# Patient Record
Sex: Male | Born: 1945
Health system: Southern US, Community
[De-identification: ages and names within clinical notes are randomized; demographics above are authoritative.]

## PROBLEM LIST (undated history)

## (undated) ENCOUNTER — Ambulatory Visit (HOSPITAL_COMMUNITY): Admission: EM | Payer: Medicare Other

## (undated) DIAGNOSIS — E7800 Pure hypercholesterolemia, unspecified: Secondary | ICD-10-CM

## (undated) DIAGNOSIS — F419 Anxiety disorder, unspecified: Secondary | ICD-10-CM

## (undated) DIAGNOSIS — I10 Essential (primary) hypertension: Secondary | ICD-10-CM

## (undated) DIAGNOSIS — Z8709 Personal history of other diseases of the respiratory system: Secondary | ICD-10-CM

## (undated) DIAGNOSIS — M545 Low back pain, unspecified: Secondary | ICD-10-CM

## (undated) DIAGNOSIS — K759 Inflammatory liver disease, unspecified: Secondary | ICD-10-CM

## (undated) DIAGNOSIS — I209 Angina pectoris, unspecified: Secondary | ICD-10-CM

## (undated) DIAGNOSIS — F4310 Post-traumatic stress disorder, unspecified: Secondary | ICD-10-CM

## (undated) DIAGNOSIS — G8929 Other chronic pain: Secondary | ICD-10-CM

## (undated) DIAGNOSIS — F410 Panic disorder [episodic paroxysmal anxiety] without agoraphobia: Secondary | ICD-10-CM

## (undated) DIAGNOSIS — M1990 Unspecified osteoarthritis, unspecified site: Secondary | ICD-10-CM

## (undated) DIAGNOSIS — K635 Polyp of colon: Secondary | ICD-10-CM

## (undated) DIAGNOSIS — F32A Depression, unspecified: Secondary | ICD-10-CM

## (undated) DIAGNOSIS — I2510 Atherosclerotic heart disease of native coronary artery without angina pectoris: Secondary | ICD-10-CM

## (undated) DIAGNOSIS — F329 Major depressive disorder, single episode, unspecified: Secondary | ICD-10-CM

## (undated) HISTORY — DX: Depression, unspecified: F32.A

## (undated) HISTORY — DX: Major depressive disorder, single episode, unspecified: F32.9

## (undated) HISTORY — DX: Polyp of colon: K63.5

## (undated) HISTORY — PX: VARICOSE VEIN SURGERY: SHX832

---

## 1967-11-30 HISTORY — PX: OTHER SURGICAL HISTORY: SHX169

## 2002-12-11 ENCOUNTER — Emergency Department (HOSPITAL_COMMUNITY): Admission: EM | Admit: 2002-12-11 | Discharge: 2002-12-11 | Payer: Self-pay | Admitting: Emergency Medicine

## 2002-12-12 ENCOUNTER — Emergency Department (HOSPITAL_COMMUNITY): Admission: EM | Admit: 2002-12-12 | Discharge: 2002-12-12 | Payer: Self-pay

## 2004-02-01 ENCOUNTER — Emergency Department (HOSPITAL_COMMUNITY): Admission: EM | Admit: 2004-02-01 | Discharge: 2004-02-01 | Payer: Self-pay | Admitting: Emergency Medicine

## 2004-08-23 ENCOUNTER — Emergency Department (HOSPITAL_COMMUNITY): Admission: EM | Admit: 2004-08-23 | Discharge: 2004-08-23 | Payer: Self-pay | Admitting: Emergency Medicine

## 2004-08-30 ENCOUNTER — Emergency Department (HOSPITAL_COMMUNITY): Admission: EM | Admit: 2004-08-30 | Discharge: 2004-08-30 | Payer: Self-pay | Admitting: Family Medicine

## 2004-10-31 ENCOUNTER — Emergency Department (HOSPITAL_COMMUNITY): Admission: EM | Admit: 2004-10-31 | Discharge: 2004-10-31 | Payer: Self-pay | Admitting: Emergency Medicine

## 2004-11-16 ENCOUNTER — Emergency Department (HOSPITAL_COMMUNITY): Admission: EM | Admit: 2004-11-16 | Discharge: 2004-11-16 | Payer: Self-pay | Admitting: Anesthesiology

## 2004-11-28 ENCOUNTER — Emergency Department (HOSPITAL_COMMUNITY): Admission: EM | Admit: 2004-11-28 | Discharge: 2004-11-28 | Payer: Self-pay | Admitting: *Deleted

## 2004-11-28 ENCOUNTER — Emergency Department (HOSPITAL_COMMUNITY): Admission: AD | Admit: 2004-11-28 | Discharge: 2004-11-28 | Payer: Self-pay | Admitting: Family Medicine

## 2005-01-17 ENCOUNTER — Emergency Department (HOSPITAL_COMMUNITY): Admission: EM | Admit: 2005-01-17 | Discharge: 2005-01-17 | Payer: Self-pay | Admitting: Family Medicine

## 2005-01-27 DIAGNOSIS — I209 Angina pectoris, unspecified: Secondary | ICD-10-CM

## 2005-01-27 HISTORY — PX: CARDIAC CATHETERIZATION: SHX172

## 2005-01-27 HISTORY — DX: Angina pectoris, unspecified: I20.9

## 2005-01-27 HISTORY — PX: CORONARY ARTERY BYPASS GRAFT: SHX141

## 2005-02-11 ENCOUNTER — Inpatient Hospital Stay (HOSPITAL_COMMUNITY): Admission: EM | Admit: 2005-02-11 | Discharge: 2005-02-21 | Payer: Self-pay | Admitting: Emergency Medicine

## 2005-02-22 ENCOUNTER — Emergency Department (HOSPITAL_COMMUNITY): Admission: EM | Admit: 2005-02-22 | Discharge: 2005-02-22 | Payer: Self-pay | Admitting: Emergency Medicine

## 2005-02-27 ENCOUNTER — Inpatient Hospital Stay (HOSPITAL_COMMUNITY): Admission: EM | Admit: 2005-02-27 | Discharge: 2005-03-03 | Payer: Self-pay | Admitting: Family Medicine

## 2005-03-17 ENCOUNTER — Emergency Department (HOSPITAL_COMMUNITY): Admission: EM | Admit: 2005-03-17 | Discharge: 2005-03-17 | Payer: Self-pay | Admitting: Family Medicine

## 2005-03-22 ENCOUNTER — Encounter (HOSPITAL_COMMUNITY): Admission: RE | Admit: 2005-03-22 | Discharge: 2005-06-20 | Payer: Self-pay | Admitting: Interventional Cardiology

## 2005-03-30 ENCOUNTER — Emergency Department (HOSPITAL_COMMUNITY): Admission: EM | Admit: 2005-03-30 | Discharge: 2005-03-30 | Payer: Self-pay | Admitting: Emergency Medicine

## 2005-04-01 ENCOUNTER — Observation Stay (HOSPITAL_COMMUNITY): Admission: AD | Admit: 2005-04-01 | Discharge: 2005-04-02 | Payer: Self-pay | Admitting: Interventional Cardiology

## 2005-05-30 ENCOUNTER — Emergency Department (HOSPITAL_COMMUNITY): Admission: EM | Admit: 2005-05-30 | Discharge: 2005-05-30 | Payer: Self-pay | Admitting: Emergency Medicine

## 2005-06-10 ENCOUNTER — Emergency Department (HOSPITAL_COMMUNITY): Admission: EM | Admit: 2005-06-10 | Discharge: 2005-06-10 | Payer: Self-pay | Admitting: Emergency Medicine

## 2005-07-09 ENCOUNTER — Emergency Department (HOSPITAL_COMMUNITY): Admission: EM | Admit: 2005-07-09 | Discharge: 2005-07-09 | Payer: Self-pay | Admitting: Family Medicine

## 2005-12-08 ENCOUNTER — Emergency Department (HOSPITAL_COMMUNITY): Admission: EM | Admit: 2005-12-08 | Discharge: 2005-12-08 | Payer: Self-pay | Admitting: Emergency Medicine

## 2005-12-12 ENCOUNTER — Emergency Department (HOSPITAL_COMMUNITY): Admission: EM | Admit: 2005-12-12 | Discharge: 2005-12-12 | Payer: Self-pay | Admitting: Emergency Medicine

## 2006-01-30 ENCOUNTER — Observation Stay (HOSPITAL_COMMUNITY): Admission: EM | Admit: 2006-01-30 | Discharge: 2006-01-31 | Payer: Self-pay | Admitting: Emergency Medicine

## 2006-03-11 ENCOUNTER — Emergency Department (HOSPITAL_COMMUNITY): Admission: EM | Admit: 2006-03-11 | Discharge: 2006-03-11 | Payer: Self-pay | Admitting: Family Medicine

## 2006-03-20 ENCOUNTER — Emergency Department (HOSPITAL_COMMUNITY): Admission: EM | Admit: 2006-03-20 | Discharge: 2006-03-20 | Payer: Self-pay | Admitting: Emergency Medicine

## 2006-04-30 ENCOUNTER — Emergency Department (HOSPITAL_COMMUNITY): Admission: EM | Admit: 2006-04-30 | Discharge: 2006-04-30 | Payer: Self-pay | Admitting: Family Medicine

## 2006-05-22 ENCOUNTER — Emergency Department (HOSPITAL_COMMUNITY): Admission: EM | Admit: 2006-05-22 | Discharge: 2006-05-22 | Payer: Self-pay | Admitting: Emergency Medicine

## 2006-09-15 ENCOUNTER — Emergency Department (HOSPITAL_COMMUNITY): Admission: EM | Admit: 2006-09-15 | Discharge: 2006-09-15 | Payer: Self-pay | Admitting: Family Medicine

## 2006-10-23 ENCOUNTER — Emergency Department (HOSPITAL_COMMUNITY): Admission: EM | Admit: 2006-10-23 | Discharge: 2006-10-23 | Payer: Self-pay | Admitting: Emergency Medicine

## 2006-12-29 ENCOUNTER — Emergency Department (HOSPITAL_COMMUNITY): Admission: EM | Admit: 2006-12-29 | Discharge: 2006-12-29 | Payer: Self-pay | Admitting: Family Medicine

## 2007-01-01 ENCOUNTER — Emergency Department (HOSPITAL_COMMUNITY): Admission: EM | Admit: 2007-01-01 | Discharge: 2007-01-01 | Payer: Self-pay | Admitting: Emergency Medicine

## 2007-04-05 ENCOUNTER — Emergency Department (HOSPITAL_COMMUNITY): Admission: EM | Admit: 2007-04-05 | Discharge: 2007-04-05 | Payer: Self-pay | Admitting: Family Medicine

## 2007-09-08 ENCOUNTER — Emergency Department (HOSPITAL_COMMUNITY): Admission: EM | Admit: 2007-09-08 | Discharge: 2007-09-08 | Payer: Self-pay | Admitting: Family Medicine

## 2007-09-21 ENCOUNTER — Emergency Department (HOSPITAL_COMMUNITY): Admission: EM | Admit: 2007-09-21 | Discharge: 2007-09-21 | Payer: Self-pay | Admitting: Emergency Medicine

## 2007-12-28 ENCOUNTER — Emergency Department (HOSPITAL_COMMUNITY): Admission: EM | Admit: 2007-12-28 | Discharge: 2007-12-28 | Payer: Self-pay | Admitting: Emergency Medicine

## 2008-05-10 ENCOUNTER — Emergency Department (HOSPITAL_COMMUNITY): Admission: EM | Admit: 2008-05-10 | Discharge: 2008-05-10 | Payer: Self-pay | Admitting: Emergency Medicine

## 2008-05-30 ENCOUNTER — Emergency Department (HOSPITAL_COMMUNITY): Admission: EM | Admit: 2008-05-30 | Discharge: 2008-05-30 | Payer: Self-pay | Admitting: Family Medicine

## 2008-06-12 ENCOUNTER — Emergency Department (HOSPITAL_COMMUNITY): Admission: EM | Admit: 2008-06-12 | Discharge: 2008-06-12 | Payer: Self-pay | Admitting: Emergency Medicine

## 2008-07-07 ENCOUNTER — Emergency Department (HOSPITAL_COMMUNITY): Admission: EM | Admit: 2008-07-07 | Discharge: 2008-07-07 | Payer: Self-pay | Admitting: Emergency Medicine

## 2008-08-01 ENCOUNTER — Emergency Department (HOSPITAL_COMMUNITY): Admission: EM | Admit: 2008-08-01 | Discharge: 2008-08-01 | Payer: Self-pay | Admitting: Emergency Medicine

## 2008-09-09 ENCOUNTER — Emergency Department (HOSPITAL_COMMUNITY): Admission: EM | Admit: 2008-09-09 | Discharge: 2008-09-09 | Payer: Self-pay | Admitting: Emergency Medicine

## 2008-09-10 ENCOUNTER — Ambulatory Visit: Payer: Self-pay | Admitting: Surgery

## 2008-09-10 ENCOUNTER — Emergency Department (HOSPITAL_COMMUNITY): Admission: EM | Admit: 2008-09-10 | Discharge: 2008-09-10 | Payer: Self-pay | Admitting: Emergency Medicine

## 2008-09-10 ENCOUNTER — Encounter (INDEPENDENT_AMBULATORY_CARE_PROVIDER_SITE_OTHER): Payer: Self-pay | Admitting: Emergency Medicine

## 2008-11-15 ENCOUNTER — Emergency Department (HOSPITAL_COMMUNITY): Admission: EM | Admit: 2008-11-15 | Discharge: 2008-11-15 | Payer: Self-pay | Admitting: Emergency Medicine

## 2008-12-29 ENCOUNTER — Emergency Department (HOSPITAL_COMMUNITY): Admission: EM | Admit: 2008-12-29 | Discharge: 2008-12-29 | Payer: Self-pay | Admitting: Emergency Medicine

## 2009-01-02 ENCOUNTER — Emergency Department (HOSPITAL_COMMUNITY): Admission: EM | Admit: 2009-01-02 | Discharge: 2009-01-02 | Payer: Self-pay | Admitting: Emergency Medicine

## 2009-01-06 ENCOUNTER — Emergency Department (HOSPITAL_COMMUNITY): Admission: EM | Admit: 2009-01-06 | Discharge: 2009-01-06 | Payer: Self-pay | Admitting: Emergency Medicine

## 2009-05-15 ENCOUNTER — Emergency Department (HOSPITAL_COMMUNITY): Admission: EM | Admit: 2009-05-15 | Discharge: 2009-05-15 | Payer: Self-pay | Admitting: Emergency Medicine

## 2009-12-26 ENCOUNTER — Emergency Department (HOSPITAL_COMMUNITY): Admission: EM | Admit: 2009-12-26 | Discharge: 2009-12-26 | Payer: Self-pay | Admitting: Emergency Medicine

## 2010-04-14 ENCOUNTER — Emergency Department (HOSPITAL_COMMUNITY): Admission: EM | Admit: 2010-04-14 | Discharge: 2010-04-14 | Payer: Self-pay | Admitting: Family Medicine

## 2010-10-24 ENCOUNTER — Observation Stay (HOSPITAL_COMMUNITY)
Admission: EM | Admit: 2010-10-24 | Discharge: 2010-10-25 | Payer: Self-pay | Source: Home / Self Care | Admitting: Emergency Medicine

## 2011-02-09 LAB — BASIC METABOLIC PANEL
CO2: 25 mEq/L (ref 19–32)
Calcium: 9.3 mg/dL (ref 8.4–10.5)
GFR calc Af Amer: >60 mL/min (ref >60)
GFR calc non Af Amer: >60 mL/min (ref >60)
Sodium: 138 mEq/L (ref 135–145)

## 2011-02-09 LAB — LIPID PANEL
Cholesterol: 252 mg/dL — ABNORMAL HIGH (ref 0–200)
Total CHOL/HDL Ratio: 6.5 RATIO

## 2011-02-09 LAB — POCT CARDIAC MARKERS
CKMB, poc: <1 ng/mL — ABNORMAL LOW (ref 1.0–8.0)
Myoglobin, poc: 62.1 ng/mL (ref 12–200)
Myoglobin, poc: 62.8 ng/mL (ref 12–200)
Troponin i, poc: <0.05 ng/mL (ref 0.00–0.09)

## 2011-02-09 LAB — CBC
Hemoglobin: 16.7 g/dL (ref 13.0–17.0)
RBC: 5.27 MIL/uL (ref 4.22–5.81)
WBC: 5.7 10*3/uL (ref 4.0–10.5)

## 2011-02-09 LAB — DIFFERENTIAL
Lymphocytes Relative: 46 % (ref 12–46)
Lymphs Abs: 2.6 10*3/uL (ref 0.7–4.0)
Monocytes Relative: 8 % (ref 3–12)
Neutro Abs: 2.5 10*3/uL (ref 1.7–7.7)
Neutrophils Relative %: 45 % (ref 43–77)

## 2011-02-09 LAB — CARDIAC PANEL(CRET KIN+CKTOT+MB+TROPI)
CK, MB: 1.3 ng/mL (ref 0.3–4.0)
Relative Index: INVALID (ref 0.0–2.5)
Relative Index: INVALID (ref 0.0–2.5)
Total CK: 74 U/L (ref 7–232)
Troponin I: 0.01 ng/mL (ref 0.00–0.06)
Troponin I: <0.01 ng/mL (ref 0.00–0.06)

## 2011-02-15 LAB — CBC
MCHC: 35.1 g/dL (ref 30.0–36.0)
Platelets: 144 10*3/uL — ABNORMAL LOW (ref 150–400)
RBC: 5.37 MIL/uL (ref 4.22–5.81)
WBC: 5.7 10*3/uL (ref 4.0–10.5)

## 2011-02-15 LAB — DIFFERENTIAL
Basophils Relative: 1 % (ref 0–1)
Monocytes Relative: 11 % (ref 3–12)
Neutro Abs: 3 10*3/uL (ref 1.7–7.7)
Neutrophils Relative %: 53 % (ref 43–77)

## 2011-02-15 LAB — POCT I-STAT, CHEM 8
Chloride: 105 mEq/L (ref 96–112)
Glucose, Bld: 98 mg/dL (ref 70–99)
HCT: 53 % — ABNORMAL HIGH (ref 39.0–52.0)
Hemoglobin: 18 g/dL — ABNORMAL HIGH (ref 13.0–17.0)
Potassium: 4.2 mEq/L (ref 3.5–5.1)
Sodium: 139 mEq/L (ref 135–145)

## 2011-03-08 LAB — RPR: RPR Ser Ql: NONREACTIVE

## 2011-04-13 NOTE — Consult Note (Signed)
[name], [name] NO.:  [id number]   MEDICAL RECORD NO.:  [id number]          PATIENT TYPE:  EMS   LOCATION:  MAJO                         FACILITY:  [facility]   PHYSICIAN:  Lyn Records, M.D.   DATE OF BIRTH:  1946/01/15   DATE OF CONSULTATION:  08/01/2008  DATE OF DISCHARGE:  08/01/2008                                 CONSULTATION   CHIEF COMPLAINT:  Chest pain.   HISTORY OF PRESENT ILLNESS:  [name] is a 65 year old male patient  with a history of coronary artery disease.  He underwent bypass grafting  in March 2006 with a LIMA to the distal LAD and a left radial artery to  the first circumflex marginal branch as well as a saphenous vein graft  to the first diagonal branch.  The next day, the patient had unrelenting  chest pain and cardiac catheterization at that time was emergently  performed, and this showed graft failure with 100% occlusion of the  saphenous vein graft to the first diagonal and kinks/spasms in the  radial OM graft and in the LIMA to the LAD graft was seen, outlet  stenosis in the distal LIMA anastomosis site obstructing the LAD greater  than 95%.  The patient was taken back to the operating room for  emergency redo coronary artery bypass grafting x1 with a LIMA to the  distal LAD.   With that history, the patient started having left neck, left chest,  left arm muscle aching today lasting only for a few minutes.  He came to  the emergency room.  His EKG is within normal limits.  He has no  associated symptoms.  He states that he occasionally gets chest pain.  He takes an aspirin and is better.  These episodes only last for a few  minutes.  These occur over several months' time period.  There is no  pattern.   He came to the emergency room today and point-of-care enzymes were  negative x2 and his EKG is normal.  He is pain-free and he is begging to  go home.   PAST MEDICAL HISTORY:  1. Hypertension.  2. Anxiety since his brother's  death in 03/17/2008.  3. Coronary artery disease as described above.  4. Hyperlipidemia.  5. Long-term medication use.   SOCIAL HISTORY:  He lives in [place] with his wife and child.  He  does not smoke.  He drinks occasionally.  He denies any illicit drug  use.   FAMILY HISTORY:  Mom died at age 73 of diabetes.  Dad died at age 18 of  coronary artery disease.  His brother recently died of prostate cancer  as described above.  His review of systems as above, otherwise negative.   ALLERGIES:  CODEINE, PERCOCET, and VICODIN.   MEDICATIONS:  1. Enteric-coated aspirin 325 mg a day.  2. Lipitor 20 mg a day.  3. Toprol-XL 25 mg a day.  4. Sublingual nitroglycerin p.r.n. chest pain.   PHYSICAL EXAMINATION:  VITAL SIGNS:  Temperature 98, pulse 72,  respirations 16, blood pressure 120/61, O2 saturations 97% on room  air.  GENERAL:  He is in no acute distress.  HEENT:  Grossly normal.  Sclerae clear.  Conjunctivae normal.  Nares  without drainage.  NECK:  No carotid or subclavian bruits.  No  JVD or thyromegaly.  CHEST:  Clear to auscultation bilaterally.  No wheezing or rhonchi.  HEART:  Regular rate and rhythm.  No evidence of murmur.  ABDOMEN:  Good bowel sounds, nontender, nondistended.  No masses.  No  bruits.  EXTREMITIES:  No lower extremity edema.  SKIN:  Warm and dry.  NEUROLOGIC:  Cranial nerves II through XII grossly intact.  Normal mood  and affect.   LABORATORY DATA:  Chest x-ray:  Nonacute.  Labs show hemoglobin of 16,  hematocrit 47, sodium 138, potassium 4.6, BUN 18, creatinine 1.2.  Point-  of-care markers negative x2.  EKG shows normal sinus rhythm with  nonspecific ST-T wave changes in the anterior leads which is unchanged  from February 2008.   ASSESSMENT AND PLAN:  1. Atypical chest pain, resolved.  2. Known coronary artery disease with complicated coronary history as      described above.  3. Hypertension.  4. Anxiety.  5. Hyperlipidemia.   I have  discussed the patient in full with Dr. Verdis Prime.  At this  point, we feel that the patient could go home, but I have arranged for  him to have a stress Cardiolite tomorrow morning at 10:00 a.m.  He is to  return to the emergency room if he has any further chest pain.  He does  have sublingual nitroglycerin at home.  He also needs to find a family  physician.  I have referred him to [facility], Dr. Renford Dills.  [name] wants to establish a primary care physician and someone to  help him with his anxiety since his brother's death.  The patient is  being discharged to home in stable and improved condition.  He is pain-  free.      Guy Franco, P.A.      Lyn Records, M.D.  Electronically Signed    LB/MEDQ  D:  08/01/2008  T:  08/02/2008  Job:  [id number]   cc:   Lyn Records, M.D.

## 2011-04-16 NOTE — Discharge Summary (Signed)
NAMEJAEGER, TRUEHEART               ACCOUNT NO.:  [id number]   MEDICAL RECORD NO.:  [id number]          PATIENT TYPE:  INP   LOCATION:  2015                         FACILITY:  [facility]   PHYSICIAN:  Lyn Records, M.D.   DATE OF BIRTH:  09/19/1946   DATE OF ADMISSION:  01/30/2006  DATE OF DISCHARGE:  01/31/2006                                 DISCHARGE SUMMARY   DISCHARGE DIAGNOSES:  1.  Left arm pain similar to angina equivalent, resolved.  2.  Palpitations controlled.  3.  Dyslipidemia, treated.  4.  Anxiety.  5.  Hypertension treated.   HOSPITAL COURSE:  Mr. Hanton is a 65 year old male patient who presented to  [facility] emergency room on January 30, 2006 complaining of left arm pain that  was similar to his previous anginal pain. He has a history of coronary  artery disease and underwent CABG in March, 2006 with a repeat CABG the  following day for a tight LIMA stenosis.   The patient's 12 lead EKG showed normal sinus rhythm rate 60 with no acute  ST-T wave changes. His cardiac isoenzymes were negative. He underwent a  stress Cardiolite that was negative with the exception of an elevated blood  pressure response. The nuclear picture showed no evidence of ischemia with  an EF of 62%. After monitoring him 24 hours in the hospital, we felt that he  was ready for discharge to home. However, he did need treatment for his  blood pressure. Because his heart rate had remained in the 50's during his  stay, we felt that we could keep him on his Toprol, but not increase it and  in fact added hydrochlorothiazide to his medication regimen.   DISCHARGE MEDICATIONS:  1.  Enteric coated aspirin 325 mg a day.  2.  Lipitor 20 mg a day.  3.  Sublingual nitroglycerin p.r.n. chest pain.  4.  Toprol XL 25 mg a day.  5.  Hydrochlorothiazide 12.5 mg once a day.   He is to return to see Dr. Katrinka Blazing on February 16, 2006 at 10:30 a.m. He is to  call if he has any further chest pain or any  questions.      Guy Franco, P.A.      Lyn Records, M.D.  Electronically Signed    LB/MEDQ  D:  01/31/2006  T:  02/01/2006  Job:  [id number]   cc:   Courtney Paris, M.D.  Fax: [phone]

## 2011-04-16 NOTE — Op Note (Signed)
[name], [name]               ACCOUNT NO.:  [id number]   MEDICAL RECORD NO.:  [id number]          PATIENT TYPE:  INP   LOCATION:  2314                         FACILITY:  [facility]   PHYSICIAN:  [name], M.D. DATE OF BIRTH:  10/21/1946   DATE OF PROCEDURE:  02/14/2005  DATE OF DISCHARGE:                                 OPERATIVE REPORT   PREOPERATIVE DIAGNOSIS:  Severe two-vessel coronary artery disease with  class IV unstable angina.   POSTOPERATIVE DIAGNOSIS:  Severe two-vessel coronary artery disease with  class IV unstable angina.   PROCEDURE:  Urgent median sternotomy for coronary artery bypass grafting x3  (left internal mammary artery to distal left anterior descending coronary  artery, left radial artery to first circumflex marginal branch, saphenous  vein graft to first diagonal branch, endoscopic saphenous vein harvest from  right thigh).   SURGEON:  [name], M.D.   ASSISTANT:  Ms. Pecola Leisure, PA   ANESTHESIA:  General.   BRIEF CLINICAL NOTE:  The patient is a 65 year old African American male  with no previous history of coronary artery disease, but risk factors  notable for history of hyperlipidemia and a strong family history of  coronary artery disease.  The patient presents with symptoms of chest pain  compatible with unstable angina.  He was admitted to the hospital and ruled  out for acute myocardial infarction.  He subsequently underwent cardiac  catheterization by Dr. Verdis Prime and findings were notable for the  presence of critical two-vessel coronary artery disease with coronary  anatomy unfavorable for percutaneous coronary intervention.  There was  particular note of high-grade proximal left anterior descending coronary  artery stenosis.  Left ventricular function is normal.  A full consultation  note has been dictated previously.   Over the ensuing 12 hours prior to surgery, the patient developed prolonged  episode of  substernal chest pain radiating to his shoulder and back  consistent with unstable angina requiring escalating therapy with IV  nitroglycerin.  The patient was stabilized somewhat with IV nitroglycerin,  but due to the prolonged episode of severe chest pain and critical high-  grade proximal left anterior descending coronary stenosis, urgent  revascularization was felt indicated.   OPERATIVE CONSENT:  The patient has been counseled at length regarding the  indications and potential benefits of coronary artery bypass grafting.  He  understands and accepts all associated risks of surgery and desires to  proceed as described.   OPERATIVE FINDINGS:  1.  Normal left ventricular function with mild left ventricular hypertrophy.  2.  Very poor saphenous vein conduit with only one segment of saphenous vein      usable as conduit for bypass grafting.  3.  Intramyocardial left anterior descending coronary artery.  4.  Good-quality distal targets for grafting   OPERATIVE NOTE IN DETAIL:  The patient is brought to the operating room and  above-mentioned date and placed in the supine position on the operating  table.  Central monitoring is established by the anesthesia service under  the care and direction of Dr. Arta Bruce.  Specifically,  a Swan-Ganz  catheter is placed through the right internal jugular approach.  A radial  arterial line is placed.  Intravenous antibiotics are administered.  Following induction with general endotracheal anesthesia, a Foley catheter  is placed.  The patient's chest, left upper extremity, abdomen, both groins,  and both lower extremities were prepared and draped in a sterile manner.   A median sternotomy incision is performed and the left internal mammary  artery is dissected from the chest wall and prepared for bypass grafting.  The left internal mammary artery is a good-quality conduit.  Simultaneously,  saphenous vein is obtained from the patient's right thigh  using endoscopic  vein harvest technique.  Thi saphenous vein is somewhat small caliber and  the majority of this vein is not suitable quality to be utilized as conduit.  One short segment of the vein is felt to be acceptable and used as conduit,  although small caliber and relatively poor quality.  The entire saphenous  vein on the left side has been removed surgically in the past, and the  patient has considerable varicose veins involving the right lower leg.   Subsequently, the left radial artery is harvested from the left forearm  through a longitudinal incision along the volar aspect of the left forearm.  The artery is dissected from associated structures and all intervening side  branches of the artery and its associated veins are divided with the  harmonic scalpel beginning just after the antecubital fossa beyond the  takeoff of the first interosseous perforating branch and the distal end is  stopped just above the wrist.  The distal end of the artery is transected  and the distal stump oversewn with suture ligatures.  The proximal end of  the artery is also transected and proximal stump oversewn with suture  ligatures.  The arterial conduit is notably of good quality.  The forearm  incision is irrigated with saline solution, inspected for hemostasis, and  subsequently closed using a single layer of running absorbable suture to  close the subcutaneous tissues followed by a running subcuticular skin  closure.  A small surgical incision in the right thigh is also closed with  running absorbable suture.   The patient is heparinized systemically.  The pericardium is opened.  The  ascending aorta is normal in appearance.  The ascending aorta and the right  atrium are cannulated for cardiopulmonary bypass.  Adequate heparinization  is verified.  Cardiopulmonary bypass is begun and the surface of the heart is inspected.  There is mild left ventricular hypertrophy.  The heart is   otherwise normal.  The left anterior descending coronary artery is  intramyocardial during the majority of its course down the anterior wall.  A  temperature probe is placed in the left ventricular septum.  A cardioplegia  catheter is placed in the ascending aorta.   The patient is allowed to cool passively to 32 degrees systemic temperature.  The aortic crossclamp is applied and cardioplegia is delivered in the  antegrade fashion through the aortic root.  Iced saline slush is applied for  topical hypothermia.  The initial cardioplegic arrest and myocardial cooling  are felt to be excellent.  Repeat doses of cardioplegia are administered  intermittently throughout the crossclamp portion of the operation, both  through the aortic root and down the single subsequently placed vein graft  to maintain septal temperature below 15 degrees centigrade.   The following distal coronary anastomoses are performed:  #1 - The first  circumflex marginal branch is grafted with left radial artery in an end-to-  side fashion.  This coronary measured 2.0 mm in diameter and is of good  quality at the site of distal bypass.  #2 - The first diagonal branch off  the left anterior descending coronary artery is grafted with a saphenous  vein graft in an end-to-side fashion.  This coronary measured 1.5 mm in  diameter and is of good quality at the site of distal bypass.  #3 - The  distal left anterior descending coronary artery is grafted with the left  internal mammary artery in and end-to-side fashion.  This coronary artery is  intramyocardial.  It measures 2 mm at the site of distal bypass.   The single proximal saphenous vein anastomoses and the proximal anastomosis  of the left radial artery are both performed directly to the ascending aorta  prior to removal of the aortic crossclamp.  The left ventricular septal  temperature rises rapidly with reperfusion of the left internal mammary  artery.  The aortic  crossclamp is removed after a total crossclamp time of  61 minutes.   The heart begins to beat spontaneously without need for cardioversion.  All  proximal and distal coronary anastomoses are inspected for hemostasis and  appropriate graft orientation.  Epicardial pacing wires are affixed to the  right ventricular outflow tract and to the right atrial appendage.  The  patient is rewarmed to 37 degrees centigrade temperature.  The patient is  weaned from cardiopulmonary bypass without difficulty.  The patient's rhythm  at separation from bypass is normal sinus rhythm.  Atrial pacing is employed  to increase the heart rate.  Total cardiopulmonary bypass time the operation  is 75 minutes.  No inotropic support is required.   Follow-up transesophageal echocardiogram performed by Dr. Michelle Piper after  separation from bypass demonstrates normal left ventricular function.  There remains trivial aortic insufficiency and trivial mitral regurgitation, both  of which were unchanged from preoperatively.  No other abnormalities are  noted.   The venous and arterial cannulae are removed uneventfully.  Protamine is  administered to reverse the anticoagulation.  The mediastinum and left chest  are irrigated with saline solution containing vancomycin.  Meticulous  surgical hemostasis is ascertained.  There is some bleeding encountered from  the left internal mammary artery graft and this is corrected with a suture  ligature.  The mediastinum and left chest are drained with 3 chest tubes  exited through separate stab incisions inferiorly.   The On-Q pain management system is utilized for postoperative pain control.  The two 10-inch Silastic catheters supplied with the On-Q system are placed  through separate stab incisions along the costal margin with catheters  located in the deep subcutaneous tissues immediately anterior to the costal  cartilages, just lateral to the lateral border of the sternum, on  either  side of the sternum.  Each of these catheters are secured to the skin with  suture ligatures and subsequently bolused with 5 mL of 0.5% bupivacaine  solution.  The catheters are then connected to the On-Q continuous pain  management pump.   The median sternotomy is closed with double-strength sternal wires.  The  soft tissues anterior to the sternum are closed in multiple layers.  The  skin was closed with running subcuticular skin closure.   The patient tolerated the procedure well and is transported to the surgical  intensive care unit in stable condition.  There are no intraoperative  complications.  All sponge, instrument and needle counts are verified  correct at completion of the operation.  No blood products were  administered.      CHO/MEDQ  D:  02/14/2005  T:  02/15/2005  Job:  [id number]   cc:   Lesleigh Noe, M.D.  [address]  [address]  [place]  [place] [address]  Fax: [phone]

## 2011-04-16 NOTE — Op Note (Signed)
[name], [name]               ACCOUNT NO.:  [id number]   MEDICAL RECORD NO.:  [id number]          PATIENT TYPE:  INP   LOCATION:  2314                         FACILITY:  [facility]   PHYSICIAN:  [name], M.D. DATE OF BIRTH:  1946/01/04   DATE OF PROCEDURE:  02/15/2005  DATE OF DISCHARGE:                                 OPERATIVE REPORT   PREOPERATIVE DIAGNOSIS:  Unstable angina with high-grade stenosis of left  internal mammary artery to left anterior descending coronary artery graft,  status post coronary artery bypass grafting.   POSTOPERATIVE DIAGNOSIS:  Unstable angina with high-grade stenosis of left  internal mammary artery to left anterior descending coronary artery graft,  status post coronary artery bypass grafting.   PROCEDURE:  Emergency redo coronary artery bypass grafting x1 (left internal  mammary artery to distal left anterior descending coronary artery).   SURGEON:  [name], M.D.   ASSISTANT:  [name], [place]   ANESTHESIA:  General.   BRIEF CLINICAL NOTE:  The patient is a 65 year old male who underwent  coronary artery bypass grafting x3 on February 14, 2005 for severe 2-vessel  coronary artery disease with class I stable angina. The patient did very  well, initially postoperatively, with no hemodynamic stability or other  complications. He was extubated uneventfully on the early morning following  surgery. However, first thing in the morning the following day the patient  reported chest pain radiating to his arm that seemed perhaps a little  uncharacteristic for routine postoperative chest wall discomfort. In  addition, a 12-lead electrocardiogram revealed 1-2 mm ST elevation in the  anterior leads across the precordium. Cardiac enzymes were a very mildly  elevated. The patient was subsequently taken for cardiac catheterization by  Dr. Katrinka [name] and findings, at the time of catheterization, include high-grade  stenosis of the left anterior  descending coronary artery just after the  insertion of the distal anastomosis of the left internal mammary artery  graft. Emergent repeat revascularization is felt to be indicated to preserve  as much myocardium as possible, prevent recurrent symptoms of angina, or  future myocardial infarction.   Operative consent the patient and his family are counseled in the  catheterization lab holding area regarding possible options including  continued observation and medical therapy with possible percutaneous  coronary intervention at a later date. Percutaneous coronary intervention at  this juncture is felt not indicated due to the early postoperative time  course. However, it is felt that repeat surgical revascularization would  provide the best long-term solution under the circumstances. The patient  understands and accepts all associated risks of surgery and desires to  proceed as described.   OPERATIVE FINDINGS:  1.  Normal left ventricular function.  2.  High-grade stenosis of the left anterior descending coronary artery just      after insertion of the left internal mammary artery graft due to      angulation of the vessel and probable technical factors related to      surgery. There was no thrombosis or clot in the vessel.  3.  The left  radial artery graft appeared to be widely patent without      evidence of kinking or other problems.  4.  Thrombosis involving the small saphenous vein graft to the diagonal      branch.  5.  No more suitable saphenous vein to be utilized as conduit for repeat      grafting.   OPERATIVE NOTE IN DETAIL:  The patient is brought directly from the  catheterization lab to the operating room on the above-mentioned date and  placed in the supine position on the operating table. General endotracheal  anesthesia is induced uneventfully under the care and direction of Dr.  Judie [name]. Intravenous antibiotics are administered. The patient's  existing  chest tubes are removed. The patient's anterior chest, abdomen,  both lower extremities are prepared and draped in a sterile manner.   A small surgical incision is made in the patient's right lower leg, just  below the right knee, and the greater saphenous vein is localized just below  the level where was transected at the time of previous graft harvest the day  previously. At this level the saphenous vein is very small and not suitable  to be utilized as conduit. This saphenous vein is followed a short distance  further down the leg, but it never gets any larger. As such, an additional  segment of saphenous vein is not removed and the small surgical incision is  closed in multiple layers with running absorbable suture.   The median sternotomy incision is reopened sharply. The sternal wires are  removed. A retractor is placed and the chest explored. Overall the heart  appears normal. Intraoperative transesophageal echocardiogram is performed  by Dr. Randa [name] and confirms the presence of normal left ventricular function  with no significant wall motion abnormalities. The patient is heparinized  systemically.   The ascending aorta is cannulated uneventfully. A 2-stage venous cannula is  placed directly in the right atrium. Adequate heparinization is verified.  Cardiopulmonary bypass is begun and the surface of the heart is inspected.  The old vein graft to the diagonal branch appears to be occluded and filled  with thrombus. There is nothing obvious on external inspection of the left  internal mammary artery graft to suggest any sort of problem; and one can  easily feel a pulse of the graft itself. The left radial artery graft  appears to be normal with no signs of kinking or other obstruction of this  vessel. A cardioplegic catheter is placed in the ascending aorta. A  temperature probe is placed left ventricular septum.  The patient's temperature is allowed to cool passively to 34  degrees  systemic temperature. The aortic crossclamp is applied and cardioplegia is  delivered in antegrade fashion through the aortic root. A bulldog clamp is  placed on the left internal mammary artery pedicle. Iced saline slush is  applied for topical hypothermia. The initial cardioplegic arrest and  myocardial cooling are felt to be excellent. No additional doses of  cardioplegia are required.   The distal anastomoses of the left internal mammary artery graft is now  carefully reinspected. The left anterior descending coronary artery is  notably deeply intramyocardial. Further exposure of the vessel beyond the  level of the distal insertion is performed to expose the distal portion of  left anterior descending coronary artery. The previous sutures of the distal  anastomoses are excised. One can appreciate some kinking and stenosis of the  left anterior descending coronary artery at the distal  toe of the  anastomosis. All the sutures are removed and the graft taken down.   After all the sutures are removed and the graft taken down, a 1.5 mm probe  will easily pass in the distal left anterior descending coronary artery.  This vessel is now open approximately 3-or-4 mm further downstream to avoid  the bend in the vessel associated with the kink and stenosis in question.  The left internal mammary artery is now trimmed back to fresh tissue and  prepared for bypass grafting. The distal anastomosis is reconstructed in the  end-to-side fashion. The 1.5 probe will easily pass after completion of the  anastomosis. The sutures were tied and the left internal mammary artery  reperfused. The left ventricular septal temperature rises rapidly.  Meticulous hemostasis is ascertained. The aortic crossclamp is removed after  a total crossclamp time of 30 minutes.   The heart begins to beat spontaneously without need for cardioversion. The  patient is rewarmed to 37 degrees centigrade temperature. The  patient is  weaned from cardiopulmonary bypass without difficulty. The patient's rhythm  at separation from brought bypass is normal sinus rhythm. Atrial pacing is  employed to increase the heart rate. No inotropic support is required. Total  cardiopulmonary bypass time for the operation is 43 minutes.   The venous cannula is removed and some bleeding is encountered from the  right atrium. This is controlled with suture ligatures. The arterial cannula  is now removed uneventfully. Protamine is administered to reverse the  anticoagulation. Meticulous surgical hemostasis is ascertained. The  mediastinum and the left chest are irrigated with saline solution containing  vancomycin.   The mediastinum and left chest are drained using 3 chest tubes replaced  through the original stab incisions inferiorly. The On-Q continuous pain  management catheters are replaced for postoperative analgesia.  Each of these catheters are tunneled through the subcutaneous tissues and positioned  immediately anterior to the costal cartilages just lateral to the lateral  border of the sternum on either side. Each of these catheters are filled  with 5 mL of 0.5% bupivacaine solution and secured to the skin. Ultimately  these catheters are then connected to the On-Q continuous pain management  infusion pump.   The sternum is reclosed using double-strength sternal wires. The soft  tissues anterior to the sternum are closed in multiple layers; and the skin  is closed with running subcuticular skin closure.   The patient tolerated the procedure well. There are no intraoperative  complications. All sponge and instrument counts are notably correct at  completion the operation. Needle counts were incorrect and a chest x-ray was  performed in the room to verify no retained foreign bodies. The patient is  transferred to the surgical intensive care unit in stable condition. No  blood products were administered.       CHO/MEDQ  D:  02/15/2005  T:  02/15/2005  Job:  [id number]   cc:   Lyn Records III, M.D.  [address]  [address]  [place]  [place] [address]  Fax: [phone]

## 2011-04-16 NOTE — Consult Note (Signed)
[name], [name]               ACCOUNT NO.:  [id number]   MEDICAL RECORD NO.:  [id number]          PATIENT TYPE:  INP   LOCATION:  4703                         FACILITY:  [facility]   PHYSICIAN:  Salvatore Decent. Cornelius Moras, M.D. DATE OF BIRTH:  07-Oct-1946   DATE OF CONSULTATION:  02/12/2005  DATE OF DISCHARGE:                                   CONSULTATION   REASON FOR CONSULTATION:  Severe two-vessel coronary artery disease with  class IV unstable angina.   HISTORY OF PRESENT ILLNESS:  [name] is a 65 year old African-American  male with no previous history of coronary artery disease but risk factors  notable for history of hyperlipidemia and a strong family history of  coronary artery disease. The patient states that he has had intermittent  atypical symptoms over the last year which occasionally have included  exertional chest discomfort. He is also had occasional dizzy spells and  atypical chest pain. Over the last week, he had progressive symptoms of  chest pain and two days ago he was awoken from sleep at 2:00 a.m. with  severe substernal chest pain described as a bad toothache radiating across  the left chest. This occurred approximately five or six hours after he had  used Viagra. He was awoken from his sleep with severe pain that persisted,  prompting him to return to the emergency room. It was associated with  shortness of breath with activity and nausea and fatigue. The pain radiated  through to the left shoulder and arm as well as to his back. He was admitted  to the hospital and ruled out for acute myocardial infarction. Chest CT scan  was performed and ruled out pulmonary embolus. He subsequently underwent a  stress Cardiolite exam, and after review by Dr. Lyn Records the nuclear  images were felt to be suggestive of possible anterior wall ischemia. [name] was taken for elective cardiac catheterization today by Dr. Lyn Records. Findings at the time of  catheterization include critical three-vessel  coronary artery disease with coronary anatomy relatively unfavorable for  percutaneous coronary intervention. There is normal left ventricular  function. Cardiac surgical consultation was requested.   REVIEW OF SYSTEMS:  GENERAL:  The patient reports good appetite. He has  gained a little bit of weight over the last couple years with decreased  activity. CARDIOVASCULAR:  Notable for symptoms of unstable angina that  prompted hospital admission. LUNGS:  The patient has mild exertional  shortness of breath. He denies resting shortness of breath, PND, orthopnea,  or lower extremity edema. He has not had palpitations or syncope.  GASTROINTESTINAL: Notable only for occasional symptoms of heartburn and  reflux. The patient denies difficulty swallowing. He reports normal bowel  function. He denies hematochezia, hematemesis, melena. NEUROLOGICAL:  Negative. The patient denies symptom suggestive of previous TIA or stroke.  GENITOURINARY:  Notable for some erectile dysfunction as well as symptoms of  benign prostatic hypertrophy. This is stable. INFECTIOUS:  Negative.  HEMATOLOGIC:  Negative. ENDOCRINE:  Negative. PSYCHIATRIC:  Negative. HEENT:  Negative. The patient does wear glasses for vision. He has  a little bit of  hearing loss in his left ear.   PAST MEDICAL HISTORY:  1.  Hyperlipidemia.  2.  Benign prostatic hypertrophy.  3.  GE Reflux disease.  4.  Remote history of peptic ulcer disease.  5.  Varicose veins right lower extremity.   PAST SURGICAL HISTORY:  Varicose vein stripping in the right lower extremity  many years ago.   FAMILY HISTORY:  Is notable in that the patient's father died of myocardial  infarction in his 79s and one cousin died of myocardial infarction in his  48s and one brother has already had a heart attack.   SOCIAL HISTORY:  The patient is divorced and lives alone here in [place].  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 The patient reports a remote  history of tobacco use, although he quit smoking 30 years ago. He denies  history of excessive alcohol consumption.   MEDICATIONS PRIOR TO ADMISSION:  None other than the occasional Viagra.   ALLERGIES:  None known.   PHYSICAL EXAMINATION:  GENERAL:  The patient is well-appearing African-  American male who appears his stated age in no acute distress.  HEENT: Exam is grossly unrevealing.  NECK:  Neck is supple. There is no cervical or supraclavicular  lymphadenopathy. There is no jugular venous distension. No carotid bruits  noted.  CHEST:  Auscultation of the chest demonstrates clear breath sounds  bilaterally. No wheezes or rhonchi appreciated.  CARDIOVASCULAR: Notable for regular rate and rhythm. No murmurs, rubs or  gallops noted.  ABDOMEN:  The abdomen is soft, nontender. There are no palpable masses.  Bowel sounds are present. EXTREMITIES: Warm and well-perfused. There is no  lower extremity edema. There are some suggestion of venous insufficiency in  the right lower extremity. On the left side there does not appear to be any  significant signs of venous insufficiency. Distal pulses are palpable in  both lower legs at the ankle. Allen test is normal in the left hand.  RECTAL:  Deferred.  GENITOURINARY:  Deferred.  NEUROLOGIC: Examination is grossly nonfocal and symmetrical throughout.   LABORATORY DATA:  Cardiac catheterization performed by Dr. Lyn Records  today is reviewed. This demonstrates severe two-vessel coronary artery  disease with coronary anatomy unfavorable for percutaneous coronary  intervention. Specifically, there is 95% proximal stenosis of the left  anterior descending coronary artery with 80-90% stenosis of the mid left  anterior descending coronary artery arising after takeoff of the third  diagonal branch. There is 80% proximal stenosis of first diagonal branch. There is 90% proximal stenosis of a large first  circumflex marginal branch.  There are luminal irregularities and right coronary artery with low grade  stenosis all less than 20-30%. There is right dominant coronary circulation.  Left ventricular function appears normal.   IMPRESSION:  Severe two-vessel coronary artery disease with class I unstable  angina, normal left ventricular function and coronary anatomy unfavorable  for percutaneous coronary intervention. I believe that [name] would best  be treated by elective coronary artery bypass grafting. He has had vein  stripping performed in the right lower extremity in the past but it looks as  though vein should be usable on the left side.   PLAN:  I have outlined options at length with [name] and one of his  brothers in the hospital this evening. Alternative treatment strategies been  discussed. He understands and accepts all associated risks of surgery  including but not limited to  risk of death, stroke, myocardial infarction,  congestive heart failure, respiratory failure,  pneumonia, bleeding requiring blood transfusion, arrhythmia, infection, and  recurrent coronary artery disease. All their questions been addressed. We  tentatively plan for him to have surgery on Monday afternoon by Dr. Sheliah Plane. end of dictation. signature Tressie Stalker and [place] copy Dr.  Verdis Prime thanks      CHO/MEDQ  D:  02/12/2005  T:  02/13/2005  Job:  [id number]   cc:   Salvatore Decent. Cornelius Moras, M.D.  [address]  [place]  [place] [address]

## 2011-04-16 NOTE — Op Note (Signed)
NAMERUDRANSH, BELLANCA               ACCOUNT NO.:  [id number]   MEDICAL RECORD NO.:  [id number]          PATIENT TYPE:  INP   LOCATION:  2314                         FACILITY:  [facility]   PHYSICIAN:  Kaylyn Layer. Michelle Piper, M.D.   DATE OF BIRTH:  [birth year]   DATE OF PROCEDURE:  02/14/2005  DATE OF DISCHARGE:                                 OPERATIVE REPORT   PROCEDURE PERFORMED:  Transesophageal echocardiographic probe placement and  monitoring during coronary artery bypass grafting.   I was called by Salvatore Decent. Cornelius Moras, M.D. to place a transesophageal echo probe  into Mr. Bon.  The patient is a 65 year old with complex coronary anatomy  with persistent pain and Dr. Cornelius Moras elected to do the patient emergently  today.   DESCRIPTION OF PROCEDURE:  After uneventful induction of anesthesia and  endotracheal intubation, the transesophageal echo probe was easily passed to  the stomach.  Initial short axis view of the left ventricle showed normal  left ventricle with no wall motion abnormalities.  Turning to the mitral  valve, structure of the valve was normal.  The leaves coapted normally and  on placing Color Flow Doppler across the valve, there was a trace of central  mitral regurgitation.  Left atrium was normal.  Flow in the pulmonary veins  was forward.  Intra-atrial septum was normal.  Turning to the aortic valve,  tricuspid, a central trace to 1+ jet of aortic insufficiency was noted.  There was no calcification of the aortic valve and no evidence of aortic  stenosis.  Looking at the left ventricular outflow tract, the tip of the  trace to 1+ aortic insufficiency jet could be seen.   The patient was placed on cardiopulmonary bypass by Dr. Cornelius Moras and underwent  coronary artery bypass grafting.  Initial view immediately prior to  discontinuing cardiopulmonary bypass showed a normal left ventricle which  was unchanged from preop and mild mitral regurgitation and aortic  insufficiency.  Otherwise  the exam was normal.  The patient discontinued  from cardiopulmonary bypass with minimal inotropes and the exam was  unchanged from preop.  Approximately 45 minutes after discontinuation of  cardiopulmonary bypass, the patient's blood pressure fell and on imaging the  left ventricle, it was unchanged from preop other than having a small  chamber and __________ was commenced.  The patient responded appropriately.   At the end of the procedure, the transesophageal echo probe was removed  uneventfully and the patient was taken to intensive care unit in stable  condition intubated.      KDO/MEDQ  D:  02/14/2005  T:  02/15/2005  Job:  [id number]

## 2011-04-16 NOTE — H&P (Signed)
[name], [name] NO.:  [id number]   MEDICAL RECORD NO.:  [id number]          PATIENT TYPE:  INP   LOCATION:  2015                         FACILITY:  [facility]   PHYSICIAN:  Meade Maw, M.D.    DATE OF BIRTH:  Mar 13, 1946   DATE OF ADMISSION:  01/30/2006  DATE OF DISCHARGE:                                HISTORY & PHYSICAL   REFERRING PHYSICIAN:  Courtney Paris, M.D.   REASON FOR ADMISSION:  Chest pain.   HISTORY OF PRESENT ILLNESS:  [name] is a 65 year old male with known  coronary artery disease status post coronary artery bypass graft surgery on  February 14, 2005, with repeat bypass surgery on February 15, 2005, for high grade  obstruction in his LIMA to his LAD.  The patient presents to the emergency  room and states that he awoke at 4 a.m. this morning with left arm pain  which he states was similar to his previous anginal pain.  However, the  patient also stated that he felt that it was arthritic in nature and  presented to the Urgent Care for further evaluation.  He was subsequently  transferred to [facility] for further evaluation.  He had no  associated symptoms.  The pain persisted throughout the day, did not radiate  to his chest.  There was no associated symptoms with the chest pain.  The  patient was last discharged from the hospital in April of 2006.  At this  time, he presented with recurrent chest pain and states that the pain was  sharp and stabbing in his left chest and in his antecubital fossa.  During  this admission, he was noted to be significantly anxious complaining of  palpitations, although, there were no arrhythmias noted at the time of these  palpitations.  His cardiac enzymes were negative.  There was no evidence of  ischemia on his EKG.  It was elected not to proceed with further cardiac  evaluation at that time.   PAST MEDICAL HISTORY:  1.  Significant for coronary artery disease as noted above.  2.  Dyslipidemia.  3.  Benign prostatic hypertrophy.  4.  Gastroesophageal reflux disease.  5.  Peptic ulcer disease.  6.  Varicose veins right lower extremity.   PAST MEDICAL HISTORY:  1.  Significant for varicose vein stripping in the right lower extremity.  2.  Coronary artery bypass graft surgery as noted above.   CURRENT MEDICATIONS:  1.  Aspirin 325 mg daily.  2.  Toprol XL 25 mg daily.  3.  Lipitor 20 mg daily.   ALLERGIES:  No known drug allergies.   FAMILY HISTORY:  Significant for the patient's father dying of myocardial  infarction in his 67's.  One cousin having myocardial infarction in his  42's.  One brother with heart attack.   SOCIAL HISTORY:  The patient is divorced and lives alone in [place].  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  He reports a remote history of  tobacco use.  Stopped smoking 30 years prior.  No history of significant  alcohol  consumption.   REVIEW OF SYSTEMS:  He has recently been evaluated in the emergency room in  January of 2007 with diagnosis of bronchitis and discharged on Zithromax.  He has had no change in bowel or bladder habits, no GI bleed is noted.  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  He has had no chest pain  with this activity. Review of systems is otherwise unremarkable.   PHYSICAL EXAMINATION:  GENERAL:  A middle-aged male somewhat anxious, but in  no acute distress.  He currently is pain-free.  VITAL SIGNS:  Systolic pressure 138/80, heart rate 56, he is afebrile, O2  saturations 100% on room air.  HEENT:  Unremarkable.  He has good carotid upstrokes.  There is no carotid  bruit.  HEART:  Breath sounds are equal and clear to auscultation.  No use of  accessory muscles.  Regular rate and rhythm with no murmurs, rubs, or  gallops noted.  ABDOMEN:  Soft, benign, and nontender.  EXTREMITIES:  No peripheral edema.  SKIN:  Warm and dry.  NEUROLOGY:  Nonfocal.   EKG reveals a normal sinus rhythm with normal EKG.    LABORATORY DATA:  Sodium 136, potassium 5.2, chloride 108, creatinine 1.0,  pH 7.4, pCO2 43, white count 6.0, hematocrit 48, platelets 195, INR 0.4.   IMPRESSION:  1.  Chest pain somewhat atypical for cardiac.  However, given the patient's      significant history, we will admit the patient.  He has not had a stress      test since his surgery in March of 2006.  Therefore, I will schedule for      a stress Cardiolite for further evaluation.  We will continue with      heparin.  The patient refuses any nitrate therapy.  I will also continue      with his aspirin at 325 mg daily.  2.  Dyslipidemia.  The patient continues on Lipitor at 20 mg daily.  3.  Anxiety.  We will order Xanax 0.25 mg p.o. b.i.d. p.r.n.  4.  Palpitations.  We will continue with Toprol.      Meade Maw, M.D.  Electronically Signed     HP/MEDQ  D:  01/30/2006  T:  01/31/2006  Job:  [id number]   cc:   Courtney Paris, M.D.  Fax: [phone]   Lyn Records, M.D.  Fax: [phone]

## 2011-04-16 NOTE — Cardiovascular Report (Signed)
[name], [name]               ACCOUNT NO.:  [id number]   MEDICAL RECORD NO.:  [id number]          PATIENT TYPE:  INP   LOCATION:  4703                         FACILITY:  [facility]   PHYSICIAN:  Lyn Records III, M.D.DATE OF BIRTH:  January 17, 1946   DATE OF PROCEDURE:  02/12/2005  DATE OF DISCHARGE:                              CARDIAC CATHETERIZATION   INDICATIONS:  Clinical story compatible with ACS.  Cardiolite study was read  as normal by radiology, but my review of the images suggest decreased  anterior wall perfusion, worse on the rest images than on the stress the  stress images, as well as postexercise EKG changes consistent with ischemia.  The study is being done to define anatomy.   PROCEDURES PERFORMED:  1.  Left heart catheterization.  2.  Selective coronary angiography.  3.  Left ventriculography.  4.  Angio-Seal arteriotomy closure.   DESCRIPTION:  After informed consent, a 6-French sheath was placed in the  right femoral artery using modified Seldinger technique.  A 6-French, B2  multipurpose catheter was then used for hemodynamic recordings, left  ventriculography by hand injection, and selective right coronary  angiography.  A #4 left Judkins catheter was used for left coronary  angiography.  The patient tolerated procedure without complications.  Angio-  Seal arteriotomy closure was performed without complications and good  hemostasis was achieved.   HEMODYNAMIC RESULTS:  1.  Aortic pressure 134/77.  Left ventricular pressure 135/19.  2.  Left ventriculography:  The left ventricle is normal in size and      demonstrates normal overall contractility.  Ejection fraction is 55%. No      regional wall motion abnormality is noted.  3.  Coronary angiography:      1.  Left main coronary.  Widely patent.  There is perhaps 25% ostial          narrowing after the catheter has resided in the ostium for awhile          most likely due to spasm.  No significant obstruction  is noted.      2.  Left anterior descending coronary.  There is 98% proximal stenosis.          There is post stenotic dilatation.  A moderate size first diagonal          also contains a 90% stenosis.  The LAD itself is relatively small.          It gives origin to three diagonal branches.  The first diagonal          which has stenosis noted is moderate in size.  The third diagonal          contains a 70% ostial narrowing and it bifurcates on the left          lateral wall.  This vessel is probably graftable.  The second          diagonal is small and not graftable.  The mid LAD at the origin of          the third diagonal contains 90% stenosis.  The  LAD does not wrap          around the apex.      3.  Circumflex artery.  The circumflex coronary artery is large and          gives origin to a dominant obtuse marginal branch.  This obtuse          marginal contains an ostial 99% stenosis.  The circumflex then goes          on to give origin to two small obtuse marginal branches.  The mid          circumflex contains 23% narrowing..      4.  Right coronary.  The right coronary artery is dominant.  Gives          origin to a large PDA that wraps around the apex.  Two left          ventricular branches arise distally.  The proximal RCA contains an          eccentric 25% stenosis.  The mid and distal vessel also contained 25-          30% stenosis.  No significant obstruction is noted.   CONCLUSION:  1.  Severe two-vessel coronary disease with high-grade proximal LAD and mid      LAD and also tight first and third diagonal branches. The first obtuse      marginal is a large dominant vessel and contains 99% stenosis.  The      right coronary is dominant and has no significant obstruction.  2.  Normal LV function with elevated LVEDP.   PLAN:  CVTS consultation.  The LAD and circumflex are not ideal for PCI due  to branch points and/or diffuse disease.      HWS/MEDQ  D:  02/12/2005  T:   02/13/2005  Job:  [id number]   cc:   Rozanna Boer., M.D.  [address]  [place]  [place] [address]  Fax: [phone]   CVTS

## 2011-04-16 NOTE — Cardiovascular Report (Signed)
NAMEOTT, ZIMMERLE               ACCOUNT NO.:  [id number]   MEDICAL RECORD NO.:  [id number]          PATIENT TYPE:  INP   LOCATION:                               FACILITY:  [facility]   PHYSICIAN:  Lyn Records III, M.D.DATE OF BIRTH:  05/14/1946   DATE OF PROCEDURE:  DATE OF DISCHARGE:                              CARDIAC CATHETERIZATION   PROCEDURE PERFORMED:  1.  Coronary angiography.  2.  Left ventriculography.  3.  Bypass graft angiography including left internal mammary artery (LIMA)      to the left anterior descending artery (LAD).   INDICATIONS:  Recurrent chest pain post coronary bypass surgery. Concern  about patency of grafts given the technical complexity of the patient's  procedure with intramyocardial LAD. Dr. Cornelius Moras wanted to have the distal  anastomosis of the LAD reevaluated.   DESCRIPTION:  After informed consent and under urgent circumstances we  brought the patient to the cardiac cath lab where a 6-French sheath was  placed in the right femoral artery using the modified Seldinger technique. A  6-French internal mammary catheter was used for left internal mammary artery  angiography. We then performed saphenous vein graft angiography using a B-2,  6-French, multipurpose catheter. We then used a #4 left Judkins catheter for  left coronary native angiography. We then used a 6-French left bypass graft  catheter for saphenous vein graft angiography. Left ventriculography was  performed by hand injection with A B-2, multipurpose catheter. The patient  tolerated the procedure without complications. The sheath was sewn in place  because of possible surgical reexploration.   RESULTS:  1.  Hemodynamic data      1.  Aortic pressure 128/83.      2.  Left ventricular pressure 128/27 mmHg  2.  Left ventriculography:  There is mid anterior wall hypokinesis. EF is      60%.  3.  Coronary angiography.      1.  Left main coronary:  The left main coronary artery contains no         significant obstructive lesion. There is ostial spasm.      2.  Left anterior descending coronary:  The LAD contains proximal 90+          percent stenosis. The mid LAD before the third diagonal contains 95%          obstruction beyond this point in the mid LAD there is competition          with the LIMA graft and the LAD beyond the graft insertion site, but          involving the graft insertion site contains 99% stenosis. The LAD          beyond this point is relatively small.      3.  Circumflex artery:  The circumflex artery is patent. The first          obtuse marginal contains 99% stenosis. The first obtuse marginal is          large. There is competition into this vessel because of a patent  graft to the midportion of the vessel.      4.  Right coronary:  Not restudied.  4.  Bypass graft angiography.      1.  Saphenous vein graft to the first diagonal: Totally occluded at the          aorto-ostial junction.      2.  Right internal mammary graft to the circumflex: This graft is widely          patent with the exception of the distal third of the graft where          there are 2 relatively acute bends that cramp the vessel. Flow into          the obtuse marginal is TIMI 3 flow.  5.  LIMA to the LAD:  The LIMA graft is widely patent. In the midvessel      There is a lot of tortuosity and what appears to be kinking or spasm.      The distal anastomosis site is patent. The LAD itself at the distal      margin of the anastomoses contains 99% stenosis. The distal vessel is      relatively small.   CONCLUSIONS:  1.  Recurrent chest pain post bypass surgery. Question ischemic versus      inflammatory.  2.  Graft failure with 100% occlusion of saphenous vein graft to D1 and      kinks or spasm in the radial OM graft and in the LIMA to the LAD graft.  3.  There is a LAD, outlet stenosis at the distal LIMA anastomoses site      obstructing the LAD by greater than 95%.  4.   Preserved LV systolic function with mid anterior wall hypokinesis.   PLAN:  Discussed with Dr. Cornelius Moras.      HWS/MEDQ  D:  02/15/2005  T:  02/15/2005  Job:  [id number]   cc:   Salvatore Decent. Cornelius Moras, M.D.  [address]  [place]  [place] [address]   Londell Moh Shelle Iron., M.D.  [address]  [place]  [place] [address]  Fax: [phone]

## 2011-04-16 NOTE — Discharge Summary (Signed)
[name], [name] NO.:  [id number]   MEDICAL RECORD NO.:  [id number]          PATIENT TYPE:  INP   LOCATION:  6524                         FACILITY:  [facility]   PHYSICIAN:  Lyn Records, M.D.   DATE OF BIRTH:  [birth year]   DATE OF ADMISSION:  02/27/2005  DATE OF DISCHARGE:  03/03/2005                                 DISCHARGE SUMMARY   CHIEF COMPLAINT AND REASON FOR ADMISSION:  [name] is a 65 year old male  patient, history of two vessel coronary artery disease, recent coronary  artery bypass graft procedure that was complicated by postoperative graft  failure.  He had elevated ST segments and CPK, underwent catheterization  post coronary artery bypass graft showing high grade stenosis in his left  anterior descending after anastomosis with the LIMA.  He subsequently  underwent redo CABG x1.  He also had significant issues with volume overload  during the hospitalization.   He presented on the date of admission to the emergency room with complaints  of recurrent chest pain.  He awakened on the morning of admission with sharp  stabbing pain in the left chest and antecubital fossa of the left arm  similar to his chest pain with prior angina prior to the CABG procedure and  no pleuritic component.  He has been coughing a small amount of white  phlegm.  No fevers or chills.  He has had nausea and diaphoresis.  Chest  pain was improved after IV nitroglycerin in the emergency room.  He was  chest pain free by the time Dr. Mayford Knife evaluated him.  He was very anxious  and complaining of palpitations despite monitors showing continuous sinus  rhythm without any evidence of tachycardia or arrhythmia.  On initial exam,  the patient's laboratories were unremarkable.  His first cardiac panel  showed normal enzymes.  His EKG showed sinus rhythm without ST segment  changes.  He was admitted by Dr. Mayford Knife with the following diagnoses:  1.  Chest pain similar to prior  angina in a patient with recent CABG      procedure and redo CABG within first 24 hours of initial CABG.  2.  CABG x3 as described above.  3.  History of palpitations without identified arrhythmias.  4.  Dyslipidemia.  5.  Productive cough without evidence of acute respiratory infection.  6.  Benign prostatic hypertrophy.   HOSPITAL COURSE:  Problem 1.  Chest pain.  The patient was admitted to the  telemetry bed where he was started on IV heparin, IV nitroglycerin, aspirin  daily.  He was continued on his Lasix to help promote postoperative  diuresis.  Serial cardiac isoenzymes were checked to rule out myocardial  infarction and CVTS was notified of the patient's re-admission due to a  history of recent CABG and significant complications within the first 24  hours of initial CABG procedure.  Within the first 24 hours, the patient had  no further chest pain.  His cardiac isoenzymes were negative. His pain at  that time was felt to be atypical.  Additional EKG's were negative.  He  was  continued on aspirin and heparin.  Nitroglycerin was weaned off.  It is  noted in the record, the patient required significant emotional reassurance  regarding his symptoms.  After talking with the patient, the patient  reported being very, very anxious about his episode of chest pain and is  concerned that something is truly happening to his heart.  He is refusing  medicine because he is scared the medicines we have given him will hurt his  heart.  Reassurance was given by Dr. Mayford Knife regarding the importance of  taking these medications.   The patient has also verbalized that he has had significant drop in blood  pressure with nitroglycerin in the past and is refusing to take this  medication.   He remained quite anxious throughout the next few days.  He has subtle T  wave inversion in V2 and V3 but enzymes remained negative.  By this point on  March 01, 2005, Dr. Katrinka Blazing had re-assumed care.  At this  point, he was  feeling that the chest pain was probably pericardial.  He also felt that the  patient had a significant degree of depression involved and spent quite a  bit of time taking with the patient.  At this point, he is deemed  appropriate for transfer from the step down unit to the basic telemetry  floor.  Dr. Katrinka Blazing ordered Imdur to start but the patient once again was  refusing this medication.  He continued to have bouts of atypical chest  pain.  By date of discharge, he was having this pain intermittently that was  worse with being in a recumbent position.  The pain was improved after IV  Decadron ordered by Dr. Katrinka Blazing.  The patient's vital signs were stable.  Dr.  Katrinka Blazing felt at this point that the patient's pain was due to post cardiac  surgery issues and felt no additional ischemic work up was indicated.  His  recommendations were to continue Imdur as well as Toprol XL, Zocor and  aspirin.  He also recommended using Advil twice a day for the next seven  days for musculoskeletal etiology and started the patient on p.o. Xanax to  help with his anxiety.  He had also been started on Lexapro during the  hospitalization for postoperative depression.  Prior to discharge, I went  over the patient's instructions with him.  He was refusing to take Imdur  secondary to decreased blood pressure in the emergency room.  I explained to  the patient that after review of the records, that the patient was volume  depleted secondary to recent Lasix, poor p.o. intake and dehydration upon  presentation.  He was still refusing Imdur at the time of discharge.   FINAL DISCHARGE DIAGNOSES:  1.  Atypical chest pain, musculoskeletal etiology due to recent cardiac      surgery.  2.  History of coronary artery bypass graft x3 with redo LIMA secondary to      kinking and anastomosis within the first 24 hours of procedure.  3.  Dyslipidemia.  4.  Anxiety and postoperative depression.  5.  Constipation  started on Colace.   DISCHARGE MEDICATIONS:  1.  Lexapro 10 mg daily.  This is new.  2.  Xanax 0.25 mg twice daily p.r.n. anxiety, #20 dispense.  This is new.  3.  Aspirin 325 mg daily.  4.  Toprol XL 25 mg daily.  5.  Lipitor 20 mg daily.  6.  Colace 100 mg capsule  two daily, purchased over-the-counter at the drug      store.  This is new.  7.  Imdur 30 mg daily.  Explanation on discharge sheet:  This is a long-      acting nitroglycerin you have been taking while in the hospital although      after further investigation, the patient had been refusing.  The patient      is still refusing at time of discharge.  8.  Advil 200 mg twice daily for the next seven days, take with food or      snack.   ACTIVITY:  As previous.   FOLLOW UP:  With your postoperative instructions per CVTS.   DIET:  Heart healthy.   WOUND CARE:  As previous.   You may call Amada Jupiter, Dr. Michaelle Copas nurse at [phone] if you have any questions.  He is to see Dr. Katrinka Blazing on March 08, 2005 at 1:45 p.m.  He is to have his  routine postoperative coronary artery bypass graft chest x-ray done at [facility] on March 08, 2005 at 11:30 a.m.       ALE/MEDQ  D:  05/05/2005  T:  05/05/2005  Job:  [id number]   cc:   Salvatore Decent. Cornelius Moras, M.D.  [address]  [place]  [place] [address]

## 2011-04-16 NOTE — Op Note (Signed)
NAMEARCHIT, LEGER               ACCOUNT NO.:  [id number]   MEDICAL RECORD NO.:  [id number]          PATIENT TYPE:  INP   LOCATION:  2314                         FACILITY:  [facility]   PHYSICIAN:  Judie Petit, M.D. DATE OF BIRTH:  10/12/1946   DATE OF PROCEDURE:  02/15/2005  DATE OF DISCHARGE:                                 OPERATIVE REPORT   INDICATIONS:  Mr. Huish presents today for redo coronary artery bypass  grafting by Dr. Purcell Nails. The transesophageal echocardiogram probe  will be utilized intraoperatively to assess ventricular function.   DESCRIPTION OF PROCEDURE:  After induction of general anesthesia, the oral  area was secured with oral endotracheal tube. The transesophageal  echocardiogram probe was heavily lubricated in a sleeve and placed down the  oropharynx.   PREBYPASS CORONARY BYPASS EXAMINATION:  Overall, there was good ventricular  contraction. Papillary muscles were well outlined. There were no masses  noted within the left ventricular chamber.   Mitral valve:  The mitral valve appeared to functioning appropriately. There  appeared to be trace to 1+ mitral regurgitant flow on Doppler examination.   Aortic valve:  Aortic valve appeared to be functioning appropriately. It was  trileaflet in nature. There was 1+ mitral regurgitation flow on examination.   Right atrium appeared to be dilated. The Swan-Ganz catheter was in the  appropriate place across the valve. Right ventricle appeared to be  functioning appropriately and grossly within normal limits.   The intra-atrial septum was intact. Left atrial appendage was visualized.  There were no masses noted.   POSTCARDIOPULMONARY BYPASS EXAMINATION:  The patient was taken off  cardiopulmonary bypass on initial attempt. Left ventricle contraction  appeared normal. Left ventricle chamber revealed evidence of low volume but  with replacement of volume the contract pattern improved. Otherwise  examination was as previously described. At the completion of the procedure,  the probe and sleeve were removed. The sleeve was intact. There was no  evidence of oral problems.      CE/MEDQ  D:  02/16/2005  T:  02/16/2005  Job:  [id number]   cc:   Patient's chart

## 2011-04-16 NOTE — H&P (Signed)
NAMEJIAN, [name] NO.:  [id number]   MEDICAL RECORD NO.:  [id number]          PATIENT TYPE:  INP   LOCATION:  3705                         FACILITY:  [facility]   PHYSICIAN:  [name], M.D.   DATE OF BIRTH:  June 09, 1946   DATE OF ADMISSION:  04/01/2005  DATE OF DISCHARGE:                                HISTORY & PHYSICAL   CHIEF COMPLAINT:  Chest pain.   HISTORY OF PRESENT ILLNESS:  [name] is a 65 year old male with known  CAD.  He had CABG x 3 on February 14, 2005, with an emergent redo x 1 on February 15, 2005, secondary to LIMA occlusion.  He presents to the office today with  complaint of left anterolateral chest pain with radiation to the left upper  arm.  The septums were associated with mild shortness of breath and nausea  as well as some diaphoresis.  They began at work today while he was looking  through some papers.  The patient admits he was somewhat anxious with this  scenario.  He, however, states these are the same symptoms he had prior to  his CABG and on postop day one, where he had an emergent redo.  Of note, he  did present to the emergency room  two days ago with complaints of chest  pain, however, that discomfort he states was completely different to what he  is experiencing today as well as his symptoms pre-CABG.  Workup in the ER  including chest x-ray, lab work, and EKG, were all normal.  Cardiac enzymes  were normal.  He was only given aspirin p.o.  He did a lot of burping and  belching which he states relieved his symptoms.  He was feeling much better  and actually signed out AMA.   In the office today, the patient was given one sublingual spray of  nitroglycerin.  He states it did relieve his anterolateral chest discomfort,  however, he continues to complain of left arm pain.   PAST MEDICAL HISTORY:  1.  CAD.      1.  Status post CABG x 3 February 14, 2005, with the following grafts:          LIMA to the LAD, radial artery to OM1,  SVG to diagonal one.      2.  Status post emergent redo CABG x 1 February 15, 2005, with a LIMA to          the LAD.  2.  Hyperlipidemia.  3.  GERD.  4.  Anxiety.  5.  BPH.   OUTPATIENT MEDICATIONS:  Toprol XL 25 mg daily, aspirin 325 mg daily,  Lipitor 20 mg daily.   ALLERGIES:  No known drug allergies.   FAMILY HISTORY:  His father died in his 54s from an MI.  He has one brother  living who also has a history of MI.   SOCIAL HISTORY:  He is divorced.  No children.  No tobacco or alcohol.  He  works in [employer].   REVIEW OF SYMPTOMS:  Negative except as stated in the  HPI.   PHYSICAL EXAMINATION:  VITAL SIGNS:  Blood pressure 140/84, heart rate 68 and regular, respirations  18.  GENERAL:  He is conscious, alert, and well oriented.  SKIN:  Slightly moist and warm.  NECK:  No JVD, cervical lymphadenopathy, thyromegaly, carotid or subclavian  bruit.  LUNGS:  Clear to auscultation bilaterally.  HEART:  Regular rate and rhythm, 1/6 systolic murmur noted in the left  sternal border.  ABDOMEN:  Soft, nontender, nondistended, normal bowel sounds x 4 quadrants.  No hepatosplenomegaly.  EXTREMITIES:  No peripheral edema.  Pulses +2 bilaterally.  NEUROLOGICAL:  No neurological deficits.  Mid sternal, left radial, and right medial thigh surgical sites healing  well.  He did have some mild keloid formation to the left radial graft site.   EKG tracing today shows a normal sinus rhythm with anterolateral T-wave  inversions in leads 1, L, V5, and V6, which are unchanged since his tracing  Mar 30, 2005.   ASSESSMENT AND PLAN:  1.  Unstable angina.  He will be admitted to rule out MI with serial cardiac      enzymes.  He will be continued on aspirin and beta blocker therapy.  He      will be started on Lovenox.  IV nitroglycerin for recurrent chest      discomfort.  He will also be placed on Indocin with questionable      musculoskeletal component with his complaint of some worsening of       discomfort with deep inhalation.  He did have some mild pericarditis      like symptoms post CABG that responded well to NSAID therapy.  2.  Anxiety.  Will restart his Xanax.  The patient states he stopped it on      his own two days ago because he did not want to run out.  3.  GERD.  Place him on PPI therapy.   Dr. Katrinka Blazing has also seen this patient and agrees with the above treatment  plan.  If cardiac enzymes are abnormal, he will decide about further workup.      HB/MEDQ  D:  04/01/2005  T:  04/01/2005  Job:  [id number]

## 2011-04-16 NOTE — Discharge Summary (Signed)
[name], [name]               ACCOUNT NO.:  [id number]   MEDICAL RECORD NO.:  [id number]           PATIENT TYPE:   LOCATION:  2021                           FACILITY:   PHYSICIAN:  Salvatore Decent. Cornelius Moras, M.D. DATE OF BIRTH:  Nov 17, 1946   DATE OF ADMISSION:  02/11/2005  DATE OF DISCHARGE:                                 DISCHARGE SUMMARY   CONTINUATION:   HOSPITAL COURSE:  [name] was seen in the emergency department by  cardiology and was admitted for further evaluation. He was ultimately ruled  out for an acute myocardial infarction. He also underwent a chest CT and was  ruled out for a pulmonary embolus. He subsequently underwent a stress  Cardiolite exam and this was suggestive of possible anterior wall ischemia.  He underwent elective cardiac catheterization by Dr. Verdis Prime on February 12, 2005 and was found to have critical three vessel coronary artery disease  which was not felt to be amenable to percutaneous intervention. Because of  this, cardiothoracic surgery consultation was obtained. Dr. Barry Dienes saw the  patient and reviewed his catheterization films. He agreed that the patient  would best be treated with elective coronary artery bypass grafting surgery.  After explanation of the risks, benefits, and alternatives, the patient  agreed to proceed with surgery. He had had previous varicose vein stripping  in his right leg and; therefore, underwent preoperative vein mapping to  ascertain if there was usable conduit. He was initially scheduled for the OR  on February 15, 2005; however, over the weekend prior to this, he developed  recurrent episodes of chest pain. Initially, the pain resolved without  problem; however, he did develop a prolonged episode which ultimately  improved with IV nitroglycerin. His EKGs showed no evidence of acute change.  He was once again seen by Dr. Cornelius Moras and Dr. Orvan July recommendation was to  proceed with emergency coronary artery bypass grafting on  March 19. He was  taken to the operating room and underwent urgent CABG x3 as described above.  Intraoperatively, his lower extremity vein was not suitable to be used as  conduit. One segment of vein was able to be used; however, he did require  harvesting of radial artery to be used as additional conduit. He tolerated  the procedure well and was transferred to the ICU in stable condition. He  was able to be extubated shortly after surgery. In the early morning of  postop day #1, he developed pain in his chest and left arm. He was noted to  have some ST elevation on his chest x-ray with a slight bump in his CK-MB.  Dr. Cornelius Moras discussed several options with the patient including continued  observation, surgical reexploration, and repeat cardiac catheterization. It  was ultimately decided to recatheterize him in order to determine if his  grafts had remained patent. He was returned to the catheterization lab by  Dr. Katrinka Blazing and was found to have a high-grade stenosis of the LAD just after  the anastomosis of the left internal mammary artery graft. After review of  the films, Dr. Cornelius Moras felt that emergency  repeat revascularization was  indicated in light of his current symptoms. He was returned to the operating  room on February 15, 2005 and underwent redo coronary artery bypass grafting x1  as above. He was returned to the ICU in stable condition. Again, he was able  to be extubated shortly after surgery. He was initially maintained on a low-  dose Dopamine drip. He was also started on Imdur for his radial artery  graft. He was able to be weaned from all drips over the next 24 hours. His  chest tube and hemodynamic monitoring devices were removed. On February 17, 2005, he was able to be transferred to the floor. Since that time, he has  done very well postoperatively. He has been quite volume overloaded and has  been started on aggressive diuresis. He has also been treated with incentive  spirometer and  aggressive pulmonary toilet measures. His other postoperative  difficulty has been some nausea and abdominal bloating which occurred on his  fourth postoperative day. An abdominal CT was performed which showed a large  simple cyst involving the left kidney but no other abnormalities. The  patient was given laxatives and was able to have a bowel movement and this  improved his symptoms. His medication regimen was adjusted and all possible  contributors to his symptoms were discontinued. Presently, he is remaining  afebrile and all vital signs are stable. His O2 saturations are greater than  90% on room air. He is still significantly volume overloaded but his  diuresing very well. He is maintaining normal sinus rhythm. His surgical  incision sites are all healing well. His most recent labs show a hemoglobin  of 9.4, hematocrit 27.6, platelets 179, white count 7.2. Also, sodium 134,  potassium 4.1, BUN 10, creatinine 0.9. He is ambulating in the halls without  difficulty. He is tolerating a regular diet and is having normal bowel and  bladder function function. It was felt if he continues to remain stable over  the next 24 to 48 hours, he will hopefully be ready for discharge home.   DISCHARGE MEDICATIONS:  1.  Enteric-coated aspirin 325 mg once daily.  2.  Toprol-XL 25 mg once daily.  3.  Lipitor 20 mg once daily.  4.  Lasix 40 mg once daily x2 weeks.  5.  K-Dur 20 mEq once daily x2 weeks.  6.  Ultram 50 to 100 mg q.4-6h. p.r.n. for pain.   DISCHARGE INSTRUCTIONS:  He is asked to refrain from driving, heavy lifting,  or strenuous activity. He is asked to continue ambulating daily and using  his incentive spirometer. He will continue a low-fat, low-sodium diet. He  may shower daily and clean his incisions with soap and water.   FOLLOWUP:  He is asked to call and make an appointment to see Dr. Katrinka Blazing in 2  weeks and have a chest x-ray at that visit. He will then see Dr. Cornelius Moras on March 15, 2005. He should bring his chest x-ray to this appointment for Dr.  Cornelius Moras to review. In the interim, if he experiences any problems or has  questions, he is asked to contact our office immediately.      GC/MEDQ  D:  02/19/2005  T:  02/20/2005  Job:  [id number]   cc:   Lyn Records III, M.D.  [address]  [address]  [place]  [place] [address]  Fax: [phone]

## 2011-04-16 NOTE — Discharge Summary (Signed)
[name], [name]               ACCOUNT NO.:  [id number]   MEDICAL RECORD NO.:  [id number]           PATIENT TYPE:   LOCATION:  2021                         FACILITY:  [facility]   PHYSICIAN:  Salvatore Decent. Cornelius Moras, M.D. DATE OF BIRTH:  [birth year]   DATE OF ADMISSION:  02/11/2005  DATE OF DISCHARGE:  02/20/2005                                 DISCHARGE SUMMARY   PRIMARY ADMITTING DIAGNOSIS:  Chest pain.   ADDITIONAL/DISCHARGE DIAGNOSES:  1.  Severe two vessel coronary artery disease.  2.  Class IV unstable angina.  3.  Unstable angina with high-grade stenosis of left internal mammary artery      to the left anterior descending coronary graft status post coronary      artery bypass grafting.  4.  Hyperlipidemia.  5.  Benign prostatic hypertrophy.  6.  Gastroesophageal reflux disease.  7.  Remote history of peptic ulcer disease.  8.  History of right lower extremity varicose vein stripping.  9.  Past history of tobacco abuse.   PROCEDURES PERFORMED:  1.  Cardiac catheterization.  2.  Urgent median sternotomy with coronary artery bypass grafting x3 (left      internal mammary artery to the distal LAD, left radial artery to the      first circumflex marginal, saphenous vein graft to the first diagonal).  3.  Endoscopic vein harvest right thigh.  4.  Emergency redo coronary artery bypass grafting x1 (left internal mammary      artery to the distal LAD).   HISTORY:  The patient is a 65 year old black male with no previous history  of coronary artery disease. He presented on the date of admission  complaining of chest pain which awoke him from sleep and radiated across his  left chest. Apparently, over the last year, he has had intermittent symptoms  of exertional chest discomfort but nothing as prolonged as this episode. He  presented to the emergency department for further evaluation. He had  associated shortness of breath as well as nausea and fatigue. He was seen by  cardiology and  was admitted for further evaluation and treatment. He was  ruled out for an acute myocardial infarction. He also underwent a chest CT  and was ruled out for a pulmonary embolus. He subsequently underwent a  stress Cardiolite exam and this was suggestive of possible anterior wall  ischemia. He underwent elective cardiac catheterization by Dr. Verdis Prime  on February 12, 2005 and was found to have critical three vessel coronary  artery disease which was not felt to be amenable to percutaneous  intervention. Because of this, cardiothoracic surgery consultation was  obtained. Dr. Cornelius Moras saw the patient and reviewed his catheterization films.  He agreed that the patient would best be treated with elective coronary  artery bypass grafting surgery. After explanation of the risks, benefits,  and alternatives, the patient agreed to proceed with surgery. He had had  previous varicose vein stripping in his right leg and; therefore, underwent  preoperative vein mapping to ascertain if there was usable conduit. He was  initially scheduled for the OR on February 15, 2005; however, over the weekend  prior to this, he developed recurrent episodes of chest pain. Initially, the  pain resolved without problem; however, he did develop a prolonged episode  which ultimately improved with IV nitroglycerin. His EKGs showed no evidence  of acute change. He was once again seen by Dr. Cornelius Moras and Dr. Orvan July  recommendation was to proceed with emergency coronary artery bypass grafting  on March 19. He was taken to the operating room and underwent urgent CABG x3  as described above. Intraoperatively, his lower extremity vein was not  suitable to be used as conduit. One segment of vein was able to be used;  however, he did require harvesting of radial artery to be used as additional  conduit. He tolerated the procedure well and was transferred to the ICU in  stable condition. He was able to be extubated shortly after surgery. In  the  early morning of postop day #1, he developed pain in his chest and left arm.  He was noted to have some ST elevation on his chest x-ray with a slight bump  in his CK-MB. Dr. Cornelius Moras discussed several options with the patient including  continued observation, surgical reexploration, and repeat cardiac  catheterization. It was ultimately decided to recatheterize him in order to  determine if his grafts had remained patent. He was returned to the  catheterization lab by Dr. Katrinka Blazing and was found to have a high-grade stenosis  of the LAD just after the anastomosis of the left internal mammary artery  graft. After review of the films, Dr. Cornelius Moras felt that emergency repeat  revascularization was indicated in light of his current symptoms. He was  returned to the operating room on February 15, 2005 and underwent redo coronary  artery bypass grafting x1 as above. He was returned to the ICU in stable  condition. Again, he was able to be extubated shortly after surgery. He was  initially maintained on a low-dose Dopamine drip. He was also started on  Imdur for his radial artery graft. He was able to be weaned from all drips  over the next 24 hours. His chest tube and hemodynamic monitoring devices  were removed. On February 17, 2005, he was able to be transferred to the floor.  Since that time, he has done very well postoperatively. He has been quite  volume overloaded and has been started on aggressive diuresis. He has also  been treated with incentive spirometer and aggressive pulmonary toilet  measures. His other postoperative difficulty has been some nausea and  abdominal bloating which occurred on his fourth postoperative day. An  abdominal CT was performed which showed a large simple cyst involving the  left kidney but no other significant abnormalities. The patient was given  laxatives and was able to have a bowel movement and this improved his symptoms. His medication regimen was adjusted and all  possible contributors  to his symptoms were discontinued. Presently, he is remaining afebrile and  all vital signs are stable. His O2 saturations are greater than 90% on room  air. He is still significantly volume overloaded but his diuresing very  well. He is maintaining normal sinus rhythm. His surgical incision sites are  all healing well. His most recent labs show a hemoglobin of 9.4, hematocrit  27.6, platelets 179, white count 7.2. Also, sodium 134, potassium 4.1, BUN  10, creatinine 0.9. He is ambulating in the halls without difficulty. He is  tolerating a regular diet and is having normal bowel and  bladder function  function. It was felt if he continues to remain stable over the next 24 to  48 hours, he will hopefully be ready for discharge home.   DISCHARGE MEDICATIONS:  1.  Enteric-coated aspirin 325 mg once daily.  2.  Toprol-XL 25 mg once daily.  3.  Lipitor 20 mg once daily.  4.  Lasix 40 mg once daily x2 weeks.  5.  K-Dur 20 mEq once daily x2 weeks.  6.  Ultram 50 to 100 mg q.4-6h. p.r.n. for pain.   DISCHARGE INSTRUCTIONS:  He is asked to refrain from driving, heavy lifting,  or strenuous activity. He is asked to continue ambulating daily and using  his incentive spirometer. He will continue a low-fat, low-sodium diet. He  may shower daily and clean his incisions with soap and water.   FOLLOWUP:  He is asked to call and make an appointment to see Dr. Katrinka Blazing in 2  weeks and have a chest x-ray at that visit. He will then see Dr. Cornelius Moras on  March 15, 2005. He should bring his chest x-ray to this appointment for Dr.  Cornelius Moras to review. In the interim, if he experiences any problems or has  questions, he is asked to contact our office immediately.      GC/MEDQ  D:  02/19/2005  T:  02/20/2005  Job:  [id number]

## 2011-08-27 LAB — URINE MICROSCOPIC-ADD ON

## 2011-08-27 LAB — URINALYSIS, ROUTINE W REFLEX MICROSCOPIC
Hgb urine dipstick: NEGATIVE
Leukocytes, UA: NEGATIVE
Nitrite: NEGATIVE
Protein, ur: 30 — AB
Specific Gravity, Urine: 1.034 — ABNORMAL HIGH
Urobilinogen, UA: 2 — ABNORMAL HIGH

## 2011-08-27 LAB — DIFFERENTIAL
Basophils Absolute: 0
Basophils Relative: 1
Lymphocytes Relative: 34
Monocytes Absolute: 0.4
Neutro Abs: 3

## 2011-08-27 LAB — CBC
MCHC: 33.1
Platelets: 141 — ABNORMAL LOW
RDW: 12.7

## 2011-08-30 LAB — DIFFERENTIAL
Lymphocytes Relative: 35
Lymphs Abs: 2.1
Monocytes Relative: 7
Neutrophils Relative %: 56

## 2011-08-30 LAB — BASIC METABOLIC PANEL
BUN: 11
Chloride: 106
Creatinine, Ser: 0.94
GFR calc Af Amer: >60
GFR calc non Af Amer: >60
Potassium: 4

## 2011-08-30 LAB — CBC
HCT: 47.5
MCV: 91.9
Platelets: 156
RBC: 5.17
WBC: 5.8

## 2011-08-30 LAB — PROTIME-INR
INR: 1
Prothrombin Time: 13.9

## 2011-08-30 LAB — D-DIMER, QUANTITATIVE: D-Dimer, Quant: 0.74 — ABNORMAL HIGH

## 2011-08-30 LAB — APTT: aPTT: 31

## 2011-09-01 LAB — POCT I-STAT, CHEM 8
BUN: 18
Calcium, Ion: 1.06 — ABNORMAL LOW
Chloride: 108
Creatinine, Ser: 1.2
Glucose, Bld: 101 — ABNORMAL HIGH
HCT: 47
Hemoglobin: 16
Potassium: 4.6
Sodium: 138
TCO2: 26

## 2011-09-01 LAB — CBC
HCT: 45.7
Hemoglobin: 15.4
MCHC: 33.7
MCV: 90.1
Platelets: 145 — ABNORMAL LOW
RBC: 5.07
RDW: 12.8
WBC: 5.2

## 2011-09-01 LAB — DIFFERENTIAL
Eosinophils Absolute: 0.1
Lymphocytes Relative: 36
Lymphs Abs: 1.9
Neutrophils Relative %: 53

## 2011-09-01 LAB — POCT CARDIAC MARKERS
CKMB, poc: 1.2
CKMB, poc: 1.4
Myoglobin, poc: 70.4
Troponin i, poc: <0.05
Troponin i, poc: <0.05

## 2011-11-04 ENCOUNTER — Emergency Department (HOSPITAL_COMMUNITY)
Admission: EM | Admit: 2011-11-04 | Discharge: 2011-11-04 | Disposition: A | Discharge status: Home / Self Care | Payer: BC Managed Care – PPO | Source: Home / Self Care | Attending: Family Medicine | Admitting: Family Medicine

## 2011-11-04 DIAGNOSIS — J069 Acute upper respiratory infection, unspecified: Secondary | ICD-10-CM

## 2011-11-04 HISTORY — DX: Essential (primary) hypertension: I10

## 2011-11-04 HISTORY — DX: Atherosclerotic heart disease of native coronary artery without angina pectoris: I25.10

## 2011-11-04 HISTORY — DX: Pure hypercholesterolemia, unspecified: E78.00

## 2011-11-04 MED ORDER — DM-GUAIFENESIN ER 60-1200 MG PO TB12: 1.0000 | ORAL_TABLET | Freq: Two times a day (BID) | ORAL | Status: AC

## 2011-11-04 MED ORDER — AZITHROMYCIN 250 MG PO TABS: 250.0000 mg | ORAL_TABLET | Freq: Every day | ORAL | Status: AC

## 2011-11-04 NOTE — ED Notes (Signed)
C/o runny nose, productive cough of green sputum,  Lt ear pain, nausea for 2 days.  States vomited x 2 yesterday.  Reports he did not sleep last pm due to cough but feels better today.

## 2011-11-04 NOTE — ED Provider Notes (Signed)
History     CSN: [id number] Arrival date & time: 11/04/2011 12:16 PM   First MD Initiated Contact with Patient 11/04/11 1215      Chief Complaint  Patient presents with  . Influenza    (Consider location/radiation/quality/duration/timing/severity/associated sxs/prior treatment) Patient is a 65 y.o. male presenting with flu symptoms. The history is provided by the patient.  Influenza This is a new problem. The current episode started more than 2 days ago. The problem occurs constantly. The problem has not changed since onset.Pertinent negatives include no shortness of breath. The symptoms are aggravated by nothing. He has tried acetaminophen for the symptoms. The treatment provided no relief.  he admits to runny nose and cough. Cough is dry. Sore throat resolved. Temp has been low grade.  Past Medical History  Diagnosis Date  . Hypertension   . Coronary artery disease   . High cholesterol     Past Surgical History  Procedure Date  . Coronary artery bypass graft     History reviewed. No pertinent family history.  History  Substance Use Topics  . Smoking status: Never Smoker   . Smokeless tobacco: Not on file  . Alcohol Use: Yes      Review of Systems  Constitutional: Negative.   HENT: Positive for congestion.   Respiratory: Positive for cough. Negative for shortness of breath.   Cardiovascular: Negative.   Gastrointestinal: Negative.   Genitourinary: Negative.   Musculoskeletal: Negative.     Allergies  Codeine  Home Medications   Current Outpatient Rx  Name Route Sig Dispense Refill  . ASPIRIN 325 MG PO TABS Oral Take 325 mg by mouth daily.      Marland Kitchen METOPROLOL SUCCINATE ER 25 MG PO TB24 Oral Take 25 mg by mouth daily.      Marland Kitchen LIPITOR PO Oral Take by mouth.      . AZITHROMYCIN 250 MG PO TABS Oral Take 1 tablet (250 mg total) by mouth daily. Take first 2 tablets together, then 1 every day until finished. 6 tablet 0  . DM-GUAIFENESIN ER 60-1200 MG PO TB12 Oral  Take 1 tablet by mouth every 12 (twelve) hours. 20 tablet 0    BP 147/77  Pulse 56  Temp(Src) 97.6 F (36.4 C) (Oral)  Resp 18  SpO2 100%  Physical Exam  Nursing note and vitals reviewed. Constitutional: He appears well-developed and well-nourished. No distress.  HENT:  Head: Normocephalic.  Right Ear: External ear normal.  Left Ear: External ear normal.  Nose: Nose normal.  Mouth/Throat: Oropharynx is clear and moist.  Neck: Normal range of motion. Neck supple.  Cardiovascular: Normal rate and regular rhythm.   Pulmonary/Chest: Effort normal and breath sounds normal.       occ cough  Lymphadenopathy:    He has no cervical adenopathy.  Skin: Skin is warm and dry.    ED Course  Procedures (including critical care time)   Labs Reviewed  POCT CBG MONITORING   No results found.   1. URI (upper respiratory infection)       MDM          Randa Spike, MD 11/04/11 1328

## 2012-01-10 ENCOUNTER — Emergency Department (HOSPITAL_COMMUNITY)
Admission: EM | Admit: 2012-01-10 | Discharge: 2012-01-10 | Disposition: A | Discharge status: Home / Self Care | Payer: BC Managed Care – PPO | Source: Home / Self Care | Attending: Emergency Medicine | Admitting: Emergency Medicine

## 2012-01-10 ENCOUNTER — Encounter (HOSPITAL_COMMUNITY): Payer: Self-pay

## 2012-01-10 DIAGNOSIS — F410 Panic disorder [episodic paroxysmal anxiety] without agoraphobia: Secondary | ICD-10-CM

## 2012-01-10 DIAGNOSIS — F411 Generalized anxiety disorder: Secondary | ICD-10-CM

## 2012-01-10 MED ORDER — ALPRAZOLAM 1 MG PO TABS: 1.0000 mg | ORAL_TABLET | Freq: Three times a day (TID) | ORAL | Status: AC | PRN

## 2012-01-10 NOTE — ED Notes (Signed)
States he has been having anxiety attacks that are increasing his BP- states he had one several days ago.  States he spoke with cardiologist this am and they added a new antihypertensive but states he needs something for the anxiety.  Reports hx of PTSD and has been on Prozac and valium in the past.

## 2012-01-10 NOTE — ED Provider Notes (Signed)
History     CSN: [id number]  Arrival date & time 01/10/12  1230   First MD Initiated Contact with Patient 01/10/12 1301      Chief Complaint  Patient presents with  . Panic Attack    (Consider location/radiation/quality/duration/timing/severity/associated sxs/prior treatment) HPI Comments: [name] process urgent care today after being referred by his cardiologist for treatment and evaluation of his ongoing anxiety attacks. 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  He is take medications for panic attacks as an aside related symptoms but has been doing well for a long time and don't like to take a lot of medicines. Has taken Valium and Prozac in the past. Today he describes he only wants to take some Valium as needed has he been having this anxiety attacks.  He also described that today his cardiologist office call an unknown blood pressure medicine 2 at to his current regimen as he has been having elevated blood pressures. Has an appointment to see his cardiologist.  The history is provided by the patient.    Past Medical History  Diagnosis Date  . Hypertension   . Coronary artery disease   . High cholesterol     Past Surgical History  Procedure Date  . Coronary artery bypass graft     No family history on file.  History  Substance Use Topics  . Smoking status: Never Smoker   . Smokeless tobacco: Not on file  . Alcohol Use: Yes      Review of Systems  Constitutional: Negative for fever, activity change and fatigue.  HENT: Negative for neck pain and neck stiffness.   Cardiovascular: Negative for chest pain.  Psychiatric/Behavioral: Positive for behavioral problems. The patient is nervous/anxious.     Allergies  Codeine  Home Medications   Current Outpatient Rx  Name Route Sig Dispense Refill  . METOPROLOL SUCCINATE ER 25 MG PO TB24 Oral Take 25 mg by mouth daily.        Marland Kitchen ALPRAZOLAM 1 MG PO TABS Oral Take 1 tablet (1 mg total) by mouth 3 (three) times daily as needed for sleep. 15 tablet 0  . ASPIRIN 325 MG PO TABS Oral Take 325 mg by mouth daily.      Marland Kitchen LIPITOR PO Oral Take by mouth.        BP 163/86  Pulse 62  Temp(Src) 98.2 F (36.8 C) (Oral)  Resp 20  SpO2 97%  Physical Exam  Constitutional: He appears well-developed and well-nourished. No distress.  Cardiovascular: Normal rate.   Pulmonary/Chest: No respiratory distress.  Neurological: He is alert. He has normal strength. No cranial nerve deficit or sensory deficit. Coordination normal.  Skin: Skin is warm.  Psychiatric: His behavior is normal. Judgment and thought content normal. His mood appears anxious. His speech is not rapid and/or pressured, not delayed, not tangential and not slurred. He is not aggressive, is not hyperactive and not withdrawn. He expresses no homicidal and no suicidal ideation. He expresses no suicidal plans and no homicidal plans.       The patient seems comfortable in no distress describing his recent health issues and concerns in a jovial and amenable way. He is attentive.    ED Course  Procedures (including critical care time)  Labs Reviewed - No data to display No results found.   1. Generalized anxiety disorder   2. Panic attacks       MDM  Patient with known history of posttraumatic stress disorder anxiety related symptoms including panic attacks was referred here by cardiologist as patient reports. To be treated for his anxiety related symptoms no referral was given to patient. Have discussed with Mr. Enck need to followup with a behavioral specialist for further evaluation and long term treatment goals. Have agreed to prescribe a benzodiazepine limited supply of no future refills at urgent care patient understands and agrees with plan of care. Will follow up with his cardiologist for blood pressure as they have added a number blood pressure medication  (unknown) as patient called them during the course of the week with elevated blood pressure readings.        Ronald Molly, MD 01/10/12 1341

## 2012-02-21 ENCOUNTER — Emergency Department (INDEPENDENT_AMBULATORY_CARE_PROVIDER_SITE_OTHER)
Admission: EM | Admit: 2012-02-21 | Discharge: 2012-02-21 | Disposition: A | Discharge status: Home / Self Care | Payer: BC Managed Care – PPO | Source: Home / Self Care | Attending: Family Medicine | Admitting: Family Medicine

## 2012-02-21 ENCOUNTER — Encounter (HOSPITAL_COMMUNITY): Payer: Self-pay | Admitting: *Deleted

## 2012-02-21 ENCOUNTER — Emergency Department (INDEPENDENT_AMBULATORY_CARE_PROVIDER_SITE_OTHER): Payer: BC Managed Care – PPO

## 2012-02-21 DIAGNOSIS — M25569 Pain in unspecified knee: Secondary | ICD-10-CM

## 2012-02-21 DIAGNOSIS — M25469 Effusion, unspecified knee: Secondary | ICD-10-CM

## 2012-02-21 MED ORDER — OXYCODONE-ACETAMINOPHEN 5-325 MG PO TABS: ORAL_TABLET | ORAL | Status: AC

## 2012-02-21 NOTE — ED Provider Notes (Signed)
History     CSN: [id number]  Arrival date & time 02/21/12  1242   First MD Initiated Contact with Patient 02/21/12 1401      Chief Complaint  Patient presents with  . Leg Pain    (Consider location/radiation/quality/duration/timing/severity/associated sxs/prior treatment) HPI Comments: Constance presents for evaluation of right knee pain. He reports onset of symptoms 10 days ago. He denies any injury to the knee. He reports radiation of pain from the hip area to just below the knee. He does report, swelling, intermittently.  Patient is a 66 y.o. male presenting with knee pain. The history is provided by the patient.  Knee Pain This is a new problem. The current episode started more than 1 week ago. The problem occurs constantly. The problem has not changed since onset.The symptoms are aggravated by walking and exertion. The symptoms are relieved by nothing.    Past Medical History  Diagnosis Date  . Hypertension   . Coronary artery disease   . High cholesterol     Past Surgical History  Procedure Date  . Coronary artery bypass graft     History reviewed. No pertinent family history.  History  Substance Use Topics  . Smoking status: Never Smoker   . Smokeless tobacco: Not on file  . Alcohol Use: Yes      Review of Systems  Constitutional: Negative.   HENT: Negative.   Eyes: Negative.   Respiratory: Negative.   Cardiovascular: Negative.   Gastrointestinal: Negative.   Genitourinary: Negative.   Musculoskeletal: Positive for joint swelling and arthralgias.       RIGHT knee pain  Skin: Negative.   Neurological: Negative.     Allergies  Codeine  Home Medications   Current Outpatient Rx  Name Route Sig Dispense Refill  . ASPIRIN 325 MG PO TABS Oral Take 325 mg by mouth daily.      Marland Kitchen LIPITOR PO Oral Take by mouth.      . METOPROLOL SUCCINATE ER 25 MG PO TB24 Oral Take 25 mg by mouth daily.      . OXYCODONE-ACETAMINOPHEN 5-325 MG PO TABS  Take one to two  tablets every 4 to 6 hours as needed for pain 20 tablet 0    BP 140/75  Pulse 62  Temp(Src) 97.7 F (36.5 C) (Oral)  Resp 14  SpO2 97%  Physical Exam  Nursing note and vitals reviewed. Constitutional: He is oriented to person, place, and time. He appears well-developed and well-nourished.  HENT:  Head: Normocephalic and atraumatic.  Eyes: EOM are normal.  Neck: Normal range of motion.  Pulmonary/Chest: Effort normal.  Musculoskeletal: Normal range of motion.       Right knee: He exhibits swelling, effusion and bony tenderness. He exhibits no deformity, normal patellar mobility and normal meniscus. tenderness found. Medial joint line tenderness noted. No lateral joint line, no MCL, no LCL and no patellar tendon tenderness noted.       Legs:      RIGHT Knee: negative patellar grind, positive medial patellar facet tenderness, negative medial and lateral joint line tenderness, negative patellar tendon tenderness, negative Lachman's, negative McMurray's; no laxity or pain with varus or valgus stress   Neurological: He is alert and oriented to person, place, and time.  Skin: Skin is warm and dry.  Psychiatric: His behavior is normal.    ED Course  Procedures (including critical care time)  Labs Reviewed - No data to display Dg Knee Complete 4 Views Right  02/21/2012  *RADIOLOGY  REPORT*  Clinical Data: The anterior knee pain for 10 days.  Soft tissue swelling.  RIGHT KNEE - COMPLETE 4+ VIEW  Comparison: None.  Findings: There is narrowing of the medial and patellofemoral compartments, associated with osteophytes.  No evidence for acute fracture or subluxation.  There is a small joint effusion.  No evidence for radiopaque foreign body or soft tissue gas.  IMPRESSION:  1.  Degenerative changes. 2.  Joint effusion.  Original Report Authenticated By: Patterson Hammersmith, M.D.     1. Knee pain   2. Knee effusion       MDM  Xray reviewed by radiologist and myself; small effusion; given  PRN oxycodone and advised to follow up with [facility]        Renaee Munda, MD 02/21/12 1527

## 2012-02-21 NOTE — Discharge Instructions (Signed)
Your x-ray showed degenerative changes of your knee. This is likely arthritic in nature. There is also small amount of fluid in your knee. This can also be from degenerative or inflammatory changes. I recommend taking Tylenol 500 mg to 650  milligrams every 8 hours for baseline pain control. Use the prescription pain medication only as needed. For breakthrough pain. Please followup with orthopedic provider listed on your discharge papers for future and continued evaluation and management of your knee pain. Return to care, sooner should, your symptoms worsen in any way.

## 2012-02-21 NOTE — ED Notes (Signed)
Pt  Reports  Pain  r  Leg    He  denys  Any  specfic  Injury  -  He  Reports  The pain  Is  Mainly in knee  Area   But  Radiates  From hip         He  Is  Ambulatory

## 2012-03-07 ENCOUNTER — Emergency Department (INDEPENDENT_AMBULATORY_CARE_PROVIDER_SITE_OTHER)
Admission: EM | Admit: 2012-03-07 | Discharge: 2012-03-07 | Disposition: A | Discharge status: Home / Self Care | Payer: BC Managed Care – PPO | Source: Home / Self Care | Attending: Emergency Medicine | Admitting: Emergency Medicine

## 2012-03-07 ENCOUNTER — Emergency Department (INDEPENDENT_AMBULATORY_CARE_PROVIDER_SITE_OTHER): Payer: BC Managed Care – PPO

## 2012-03-07 ENCOUNTER — Encounter (HOSPITAL_COMMUNITY): Payer: Self-pay | Admitting: Cardiology

## 2012-03-07 DIAGNOSIS — J329 Chronic sinusitis, unspecified: Secondary | ICD-10-CM

## 2012-03-07 DIAGNOSIS — J40 Bronchitis, not specified as acute or chronic: Secondary | ICD-10-CM

## 2012-03-07 HISTORY — DX: Panic disorder (episodic paroxysmal anxiety): F41.0

## 2012-03-07 HISTORY — DX: Anxiety disorder, unspecified: F41.9

## 2012-03-07 MED ORDER — HYDROCODONE-ACETAMINOPHEN 7.5-500 MG/15ML PO SOLN: 5.0000 mL | Freq: Four times a day (QID) | ORAL | Status: AC | PRN

## 2012-03-07 MED ORDER — PREDNISONE 20 MG PO TABS
ORAL_TABLET | ORAL | Status: AC
  Filled 2012-03-07: qty 3

## 2012-03-07 MED ORDER — GUAIFENESIN ER 600 MG PO TB12: 1200.0000 mg | ORAL_TABLET | Freq: Two times a day (BID) | ORAL | Status: DC

## 2012-03-07 MED ORDER — DOXYCYCLINE HYCLATE 100 MG PO CAPS: 100.0000 mg | ORAL_CAPSULE | Freq: Two times a day (BID) | ORAL | Status: AC

## 2012-03-07 MED ORDER — PREDNISONE 20 MG PO TABS: 60.0000 mg | ORAL_TABLET | Freq: Once | ORAL | Status: DC

## 2012-03-07 MED ORDER — DEXAMETHASONE SODIUM PHOSPHATE 10 MG/ML IJ SOLN
INTRAMUSCULAR | Status: AC
  Filled 2012-03-07: qty 2

## 2012-03-07 MED ORDER — FLUTICASONE PROPIONATE 50 MCG/ACT NA SUSP: 2.0000 | Freq: Every day | NASAL | Status: DC

## 2012-03-07 MED ORDER — ALBUTEROL SULFATE (5 MG/ML) 0.5% IN NEBU
5.0000 mg | INHALATION_SOLUTION | Freq: Once | RESPIRATORY_TRACT | Status: AC
  Administered 2012-03-07: 5 mg via RESPIRATORY_TRACT

## 2012-03-07 MED ORDER — ALBUTEROL SULFATE HFA 108 (90 BASE) MCG/ACT IN AERS: 1.0000 | INHALATION_SPRAY | Freq: Four times a day (QID) | RESPIRATORY_TRACT | Status: DC | PRN

## 2012-03-07 MED ORDER — DEXAMETHASONE 4 MG PO TABS: ORAL_TABLET | ORAL | Status: AC

## 2012-03-07 MED ORDER — ALBUTEROL SULFATE (5 MG/ML) 0.5% IN NEBU
INHALATION_SOLUTION | RESPIRATORY_TRACT | Status: AC
  Filled 2012-03-07: qty 1

## 2012-03-07 MED ORDER — IPRATROPIUM BROMIDE 0.02 % IN SOLN
0.5000 mg | Freq: Once | RESPIRATORY_TRACT | Status: AC
  Administered 2012-03-07: 0.5 mg via RESPIRATORY_TRACT

## 2012-03-07 MED ORDER — DEXAMETHASONE SODIUM PHOSPHATE 10 MG/ML IJ SOLN
16.0000 mg | Freq: Once | INTRAMUSCULAR | Status: AC
  Administered 2012-03-07: 16 mg

## 2012-03-07 NOTE — ED Notes (Signed)
Pt reports abd cramping that started yesterday. He had diarrhea Sunday and Monday of this week. He states he has had a cough with thick sputum dark yellow in color that started a week ago. He has been coughing all night long. Saturday and Sunday he reports a fever relieved with tylenol and asa. He is able to tolerate water and juice with decreased food intake and decreased appetite. Chest is tender to touch over the sternal area and he reports chest feels tight. He has been coughing and attributes the pain to that but does have chest discomfort while laying on the exam table at this time.

## 2012-03-07 NOTE — ED Provider Notes (Signed)
History     CSN: [id number]  Arrival date & time 03/07/12  1433   First MD Initiated Contact with Patient 03/07/12 1606      Chief Complaint  Patient presents with  . Cough  . Fever  . Diarrhea  . Nasal Congestion    (Consider location/radiation/quality/duration/timing/severity/associated sxs/prior treatment) HPI Comments: Patient reports URI symptoms with nasal congestion, postnasal drip, coughing for 10 days. States that he is now coughing up dark yellow sputum, and had fevers Tmax 101 2 days ago. Reported some loose, nonbloody, watery stools. Patient is reporting increasing wheezing, chest tightness, shortness of breath. Patient states is unable to sleep at night secondary to all the coughing. Reports achy chest pain after coughing, no other chest pain. States that his chest and upper abdomen feel "sore" and reports no other abdominal pain. Has been taking Mucinex fast max and Alka-Seltzer plus cold and cough, both of which have phenylephrine in them. Patient also reports maxillary sinus pain/pressure, purulent nasal discharge starting today. No other headaches.   ROS as noted in HPI. All other ROS negative.   Patient is a 67 y.o. male presenting with cough. The history is provided by the patient.  Cough This is a new problem. The problem occurs constantly. The problem has been gradually worsening. The cough is productive of sputum. The maximum temperature recorded prior to his arrival was 101 to 101.9 F. Associated symptoms include chest pain, chills, rhinorrhea, sore throat, myalgias, shortness of breath and wheezing. Pertinent negatives include no headaches. The treatment provided mild relief. He is not a smoker. His past medical history does not include pneumonia, COPD or asthma.    Past Medical History  Diagnosis Date  . Hypertension   . Coronary artery disease   . High cholesterol   . Anxiety   . Panic attacks     Past Surgical History  Procedure Date  . Coronary artery  bypass graft 01/2005    History reviewed. No pertinent family history.  History  Substance Use Topics  . Smoking status: Never Smoker   . Smokeless tobacco: Not on file  . Alcohol Use: Yes      Review of Systems  Constitutional: Positive for chills.  HENT: Positive for sore throat and rhinorrhea.   Respiratory: Positive for cough, shortness of breath and wheezing.   Cardiovascular: Positive for chest pain.  Musculoskeletal: Positive for myalgias.  Neurological: Negative for headaches.    Allergies  Codeine  Home Medications   Current Outpatient Rx  Name Route Sig Dispense Refill  . ASPIRIN 325 MG PO TABS Oral Take 325 mg by mouth daily.      Marland Kitchen LIPITOR PO Oral Take by mouth.      . METOPROLOL SUCCINATE ER 25 MG PO TB24 Oral Take 25 mg by mouth daily.      . ALBUTEROL SULFATE HFA 108 (90 BASE) MCG/ACT IN AERS Inhalation Inhale 1-2 puffs into the lungs every 6 (six) hours as needed for wheezing. Dispense with aerochamber 1 Inhaler 0  . DEXAMETHASONE 4 MG PO TABS  4 tabs po at once 4 tablet 0  . DOXYCYCLINE HYCLATE 100 MG PO CAPS Oral Take 1 capsule (100 mg total) by mouth 2 (two) times daily. X 7 days 14 capsule 0  . FLUTICASONE PROPIONATE 50 MCG/ACT NA SUSP Nasal Place 2 sprays into the nose daily. 16 g 2  . GUAIFENESIN ER 600 MG PO TB12 Oral Take 2 tablets (1,200 mg total) by mouth 2 (two) times daily.  28 tablet 0  . HYDROCODONE-ACETAMINOPHEN 7.5-500 MG/15ML PO SOLN Oral Take 5 mLs by mouth every 6 (six) hours as needed for cough. 120 mL 0    BP 156/72  Pulse 60  Temp(Src) 97.9 F (36.6 C) (Oral)  Resp 14  SpO2 95%   Physical Exam  Nursing note and vitals reviewed. Constitutional: He is oriented to person, place, and time. He appears well-developed and well-nourished.  HENT:  Head: Normocephalic and atraumatic.  Right Ear: Hearing, tympanic membrane and ear canal normal.  Left Ear: Hearing, tympanic membrane and ear canal normal.  Nose: Mucosal edema and  rhinorrhea present. No epistaxis.  Mouth/Throat: Uvula is midline and mucous membranes are normal. Posterior oropharyngeal erythema present. No oropharyngeal exudate.       Maxillary sinus tenderness bilaterally. No frontal sinus tenderness Purulent nasal discharge.  Eyes: Conjunctivae and EOM are normal. Pupils are equal, round, and reactive to light.  Neck: Normal range of motion. Neck supple.  Cardiovascular: Normal rate, regular rhythm, normal heart sounds and intact distal pulses.   Pulmonary/Chest: Effort normal and breath sounds normal. No respiratory distress. He exhibits tenderness.       Fair air movement. Sternal surgical scar. Sternum stable when patient coughing. Patient has diffuse chest wall tenderness.   Abdominal: Soft. Bowel sounds are normal. He exhibits no distension.       Mild tenderness upper abdominal muscles. No other abdominal tenderness.  Musculoskeletal: Normal range of motion. He exhibits no edema.  Lymphadenopathy:    He has no cervical adenopathy.  Neurological: He is alert and oriented to person, place, and time.  Skin: Skin is warm and dry. No rash noted.  Psychiatric: He has a normal mood and affect. His behavior is normal. Judgment and thought content normal.    ED Course  Procedures (including critical care time)  Labs Reviewed - No data to display Dg Chest 2 View  03/07/2012  *RADIOLOGY REPORT*  Clinical Data: Chest congestion.  CHEST - 2 VIEW  Comparison: 03/1977 1011.  Findings: The cardiac silhouette, mediastinal and hilar contours are stable.  Stable surgical changes from bypass surgery.  Low lung volumes with vascular crowding and atelectasis.  No infiltrates or effusions.  The bony thorax is intact.  IMPRESSION: Low lung volumes with vascular crowding and atelectasis.  Original Report Authenticated By: P. Loralie Champagne, M.D.     1. Bronchitis   2. Sinusitis     Imaging reviewed by myself. Report per radiologist.  Dg Chest 2 View  03/07/2012   *RADIOLOGY REPORT*  Clinical Data: Chest congestion.  CHEST - 2 VIEW  Comparison: 03/1977 1011.  Findings: The cardiac silhouette, mediastinal and hilar contours are stable.  Stable surgical changes from bypass surgery.  Low lung volumes with vascular crowding and atelectasis.  No infiltrates or effusions.  The bony thorax is intact.  IMPRESSION: Low lung volumes with vascular crowding and atelectasis.  Original Report Authenticated By: P. Loralie Champagne, M.D.    EKG: Sinus bradycardia, rate 59. Normal intervals, normal axis, nonspecific T-wave changes, present in previous EKG from November 2011. No acute changes.  MDM  Previous records reviewed. Additional medical history obtained.   BP on previous 2 visits within this range. Oxygen saturation on previous 2 visits 97%. Suspect elevated blood pressure is from the decongestant medication that he has been taking. Advised patient to stop this. Giving him a breathing treatment, and steroids. Will reevaluate him. Suspect bronchitis or pneumonia, and sinusitis, as patient has had this for over  10 days. If x-ray is negative for pneumonia we'll treat as bronchitis. Sending home with doxycycline for sinus infection which will also cover any other respiratory pathogens.  On reevaluation, improved air movement. Lungs clear. Patient states he feels significantly better. Discussed imaging findings, MDM, and plan with patient.  Luiz Blare, MD 03/07/12 Zollie Pee

## 2012-03-07 NOTE — Discharge Instructions (Signed)
Take two puffs from your albuterol inhaler every 4 hours. Finish the steroids unless your doctor tells you to stop. Finish the antibiotics, even if you feel better. You may decrease the frequency of your albuterol inhaler as  you start feeling better. You may start the steroids tomorrow, as you have already taken today's dose. You may take tylenol 1 gram up to 4 times a day as needed for pain. This with 600 mg of motrin is an effective combination for pain and fever. Make sure you drink extra fluids. Return if you get worse, have a fever >100.4, or any other concerns.   Go to www.goodrx.com to look up your medications. This will give you a list of where you can find your prescriptions at the most affordable prices.

## 2012-08-25 ENCOUNTER — Encounter (HOSPITAL_COMMUNITY): Payer: Self-pay

## 2012-08-25 ENCOUNTER — Emergency Department (HOSPITAL_COMMUNITY): Payer: BC Managed Care – PPO

## 2012-08-25 ENCOUNTER — Observation Stay (HOSPITAL_COMMUNITY)
Admission: EM | Admit: 2012-08-25 | Discharge: 2012-08-26 | Disposition: A | Discharge status: Home / Self Care | Payer: BC Managed Care – PPO | Attending: Interventional Cardiology | Admitting: Interventional Cardiology

## 2012-08-25 ENCOUNTER — Observation Stay (HOSPITAL_COMMUNITY): Payer: BC Managed Care – PPO

## 2012-08-25 DIAGNOSIS — R079 Chest pain, unspecified: Secondary | ICD-10-CM

## 2012-08-25 DIAGNOSIS — R791 Abnormal coagulation profile: Secondary | ICD-10-CM | POA: Insufficient documentation

## 2012-08-25 DIAGNOSIS — R0789 Other chest pain: Principal | ICD-10-CM | POA: Insufficient documentation

## 2012-08-25 DIAGNOSIS — F411 Generalized anxiety disorder: Secondary | ICD-10-CM | POA: Insufficient documentation

## 2012-08-25 DIAGNOSIS — I2510 Atherosclerotic heart disease of native coronary artery without angina pectoris: Secondary | ICD-10-CM | POA: Diagnosis not present

## 2012-08-25 DIAGNOSIS — F410 Panic disorder [episodic paroxysmal anxiety] without agoraphobia: Secondary | ICD-10-CM | POA: Insufficient documentation

## 2012-08-25 DIAGNOSIS — I10 Essential (primary) hypertension: Secondary | ICD-10-CM | POA: Insufficient documentation

## 2012-08-25 DIAGNOSIS — E785 Hyperlipidemia, unspecified: Secondary | ICD-10-CM | POA: Insufficient documentation

## 2012-08-25 DIAGNOSIS — I200 Unstable angina: Secondary | ICD-10-CM | POA: Diagnosis not present

## 2012-08-25 DIAGNOSIS — J984 Other disorders of lung: Secondary | ICD-10-CM | POA: Diagnosis not present

## 2012-08-25 HISTORY — DX: Angina pectoris, unspecified: I20.9

## 2012-08-25 HISTORY — DX: Other chronic pain: G89.29

## 2012-08-25 HISTORY — DX: Personal history of other diseases of the respiratory system: Z87.09

## 2012-08-25 HISTORY — DX: Low back pain: M54.5

## 2012-08-25 HISTORY — DX: Low back pain, unspecified: M54.50

## 2012-08-25 HISTORY — DX: Unspecified osteoarthritis, unspecified site: M19.90

## 2012-08-25 HISTORY — DX: Post-traumatic stress disorder, unspecified: F43.10

## 2012-08-25 LAB — TROPONIN I
Troponin I: <0.3 ng/mL (ref <0.30)
Troponin I: <0.3 ng/mL (ref <0.30)

## 2012-08-25 LAB — CREATININE, SERUM
Creatinine, Ser: 0.84 mg/dL (ref 0.50–1.35)
GFR calc Af Amer: >90 mL/min (ref >90)
GFR calc non Af Amer: 90 mL/min — ABNORMAL LOW (ref >90)

## 2012-08-25 LAB — COMPREHENSIVE METABOLIC PANEL
ALT: 116 U/L — ABNORMAL HIGH (ref 0–53)
AST: 93 U/L — ABNORMAL HIGH (ref 0–37)
Albumin: 3.7 g/dL (ref 3.5–5.2)
Alkaline Phosphatase: 55 U/L (ref 39–117)
CO2: 25 mEq/L (ref 19–32)
Chloride: 97 mEq/L (ref 96–112)
GFR calc non Af Amer: 77 mL/min — ABNORMAL LOW (ref >90)
Potassium: 3.8 mEq/L (ref 3.5–5.1)
Total Bilirubin: 0.5 mg/dL (ref 0.3–1.2)

## 2012-08-25 LAB — CBC WITH DIFFERENTIAL/PLATELET
Basophils Absolute: 0 10*3/uL (ref 0.0–0.1)
Basophils Relative: 0 % (ref 0–1)
HCT: 44.1 % (ref 39.0–52.0)
Hemoglobin: 15.1 g/dL (ref 13.0–17.0)
Lymphocytes Relative: 40 % (ref 12–46)
MCHC: 34.2 g/dL (ref 30.0–36.0)
Monocytes Relative: 9 % (ref 3–12)
Neutro Abs: 2.9 10*3/uL (ref 1.7–7.7)
Neutrophils Relative %: 49 % (ref 43–77)
RDW: 12.7 % (ref 11.5–15.5)
WBC: 5.8 10*3/uL (ref 4.0–10.5)

## 2012-08-25 LAB — HEMOGLOBIN A1C
Hgb A1c MFr Bld: 5.9 % — ABNORMAL HIGH (ref <5.7)
Mean Plasma Glucose: 123 mg/dL — ABNORMAL HIGH (ref <117)

## 2012-08-25 LAB — CBC
Platelets: 151 10*3/uL (ref 150–400)
RBC: 5.07 MIL/uL (ref 4.22–5.81)
RDW: 12.7 % (ref 11.5–15.5)
WBC: 5.6 10*3/uL (ref 4.0–10.5)

## 2012-08-25 LAB — CK TOTAL AND CKMB (NOT AT ARMC)
Total CK: 201 U/L (ref 7–232)
Total CK: 146 U/L (ref 7–232)

## 2012-08-25 LAB — PROTIME-INR: INR: 1.08 (ref 0.00–1.49)

## 2012-08-25 LAB — APTT: aPTT: 31 seconds (ref 24–37)

## 2012-08-25 MED ORDER — ACETAMINOPHEN 325 MG PO TABS: 650.0000 mg | ORAL_TABLET | ORAL | Status: DC | PRN

## 2012-08-25 MED ORDER — NITROGLYCERIN 0.4 MG SL SUBL
0.4000 mg | SUBLINGUAL_TABLET | SUBLINGUAL | Status: DC | PRN
  Administered 2012-08-25: 0.4 mg via SUBLINGUAL
  Filled 2012-08-25: qty 25

## 2012-08-25 MED ORDER — METOPROLOL SUCCINATE ER 25 MG PO TB24
25.0000 mg | ORAL_TABLET | Freq: Every day | ORAL | Status: DC
  Administered 2012-08-26: 25 mg via ORAL
  Filled 2012-08-25: qty 1

## 2012-08-25 MED ORDER — ZOLPIDEM TARTRATE 5 MG PO TABS: 5.0000 mg | ORAL_TABLET | Freq: Every evening | ORAL | Status: DC | PRN

## 2012-08-25 MED ORDER — HEPARIN SODIUM (PORCINE) 5000 UNIT/ML IJ SOLN
5000.0000 [IU] | Freq: Three times a day (TID) | INTRAMUSCULAR | Status: DC
  Administered 2012-08-25: 5000 [IU] via SUBCUTANEOUS
  Filled 2012-08-25 (×6): qty 1

## 2012-08-25 MED ORDER — ASPIRIN 300 MG RE SUPP
300.0000 mg | RECTAL | Status: AC
  Filled 2012-08-25: qty 1

## 2012-08-25 MED ORDER — ASPIRIN EC 81 MG PO TBEC
81.0000 mg | DELAYED_RELEASE_TABLET | Freq: Every day | ORAL | Status: DC
  Administered 2012-08-26: 81 mg via ORAL
  Filled 2012-08-25: qty 1

## 2012-08-25 MED ORDER — LISINOPRIL 10 MG PO TABS
10.0000 mg | ORAL_TABLET | Freq: Every day | ORAL | Status: DC
  Administered 2012-08-25 – 2012-08-26 (×2): 10 mg via ORAL
  Filled 2012-08-25 (×2): qty 1

## 2012-08-25 MED ORDER — ASPIRIN 81 MG PO CHEW
324.0000 mg | CHEWABLE_TABLET | ORAL | Status: AC
  Administered 2012-08-25: 324 mg via ORAL
  Filled 2012-08-25: qty 4

## 2012-08-25 MED ORDER — ONDANSETRON HCL 4 MG/2ML IJ SOLN: 4.0000 mg | Freq: Four times a day (QID) | INTRAMUSCULAR | Status: DC | PRN

## 2012-08-25 MED ORDER — NITROGLYCERIN 0.4 MG SL SUBL: 0.4000 mg | SUBLINGUAL_TABLET | SUBLINGUAL | Status: DC | PRN

## 2012-08-25 MED ORDER — ALPRAZOLAM 0.25 MG PO TABS
0.2500 mg | ORAL_TABLET | Freq: Two times a day (BID) | ORAL | Status: DC | PRN
  Administered 2012-08-25 – 2012-08-26 (×3): 0.25 mg via ORAL
  Filled 2012-08-25 (×3): qty 1

## 2012-08-25 MED ORDER — ASPIRIN 81 MG PO CHEW: 324.0000 mg | CHEWABLE_TABLET | Freq: Once | ORAL | Status: DC

## 2012-08-25 NOTE — ED Notes (Signed)
Pt has remained without chest pain or shortness of breath

## 2012-08-25 NOTE — ED Notes (Signed)
Pt states he has a history of triple CABG in 2006.  Dr. Katrinka Blazing is his cardiologist.  States over the last 2 months he has been having intermittent sharp chest pain radiating to his left shoulder-states he has not called his cardiologist-pt states started having the discomfort at 2200 last night-and awakened him this AM at ~0300.  States he was lying on the couch at 0100 when he had dozed off-states happened at that time too-pt took ASA at bedtime last night and took 3 aspirins starting at 0300.

## 2012-08-25 NOTE — H&P (Signed)
Admit date: 08/25/2012 Referring Physician Dr. Dierdre Highman Primary Cardiologist Dr. Verdis Prime III Chief complaint/reason for admission:chest discomfort  HPI: 66 y/o with CAD s/p CABG in 2006.  He had a redo operation 2 days later due to a mechanical complication with his LIMA to LAD.  He had typical angina and anterior ST elevation before the redo CABG.  He has done well since then.  He had a stress test a few years ago that was reportedly normal.  Over the past 2 weeks, he has been under a lot of stress.  He has not slept well. Yesterday, he began having a twitching feeling in the left side of his chest.  He describes it as a muscle spasm.  It is different than what he had prior to his bypass.  It was also going down the left arm somewhat.  It resolved after each unit aspirin.  He came to the emergency room and his troponin was negative as well as his ECG.  The discomfort came back and was short lived.  Because of these recurrent symptoms, he is referred for admission.  Of note, he has been intolerant of statins do to increase in liver function tests.    PMH:    Past Medical History  Diagnosis Date  . Hypertension   . Coronary artery disease   . High cholesterol   . Anxiety   . Panic attacks     PSH:    Past Surgical History  Procedure Date  . Coronary artery bypass graft 01/2005    ALLERGIES:   Codeine  Prior to Admit Meds:   Prescriptions prior to admission  Medication Sig Dispense Refill  . aspirin EC 325 MG tablet Take 975 mg by mouth once.      Marland Kitchen aspirin EC 325 MG tablet Take 325 mg by mouth every morning.      . metoprolol succinate (TOPROL-XL) 25 MG 24 hr tablet Take 25 mg by mouth daily.        . nitroGLYCERIN (NITROSTAT) 0.4 MG SL tablet Place 0.4 mg under the tongue every 5 (five) minutes x 3 doses as needed. For chest pain       Family HX:   No family history on file. Social HX:    History   Social History  . Marital Status: Married    Spouse Name: N/A    Number of  Children: N/A  . Years of Education: N/A   Occupational History  . Not on file.   Social History Main Topics  . Smoking status: Never Smoker   . Smokeless tobacco: Not on file  . Alcohol Use: Yes  . Drug Use: No  . Sexually Active:    Other Topics Concern  . Not on file   Social History Narrative  . No narrative on file     ROS:  All 11 ROS were addressed and are negative except what is stated in the HPI  PHYSICAL EXAM Filed Vitals:   08/25/12 1036  BP: 172/87  Pulse: 63  Temp: 97.9 F (36.6 C)  Resp: 18   General: Well developed, well nourished, in no acute distress Head:   Normal cephalic and atramatic  Lungs:   Clear bilaterally to auscultation. Heart:   HRRR S1 S2 Abdomen:  abdomen soft and non-tender  Msk:   Normal strength and tone for age. Extremities:   No edema.   Neuro: Alert and oriented X 3. Psych:  Flat affect, responds appropriately   Labs:  Lab Results  Component Value Date   WBC 5.8 08/25/2012   HGB 15.1 08/25/2012   HCT 44.1 08/25/2012   MCV 89.8 08/25/2012   PLT 134* 08/25/2012    Lab 08/25/12 0515  NA 134*  K 3.8  CL 97  CO2 25  BUN 15  CREATININE 1.00  CALCIUM 9.2  PROT 7.9  BILITOT 0.5  ALKPHOS 55  ALT 116*  AST 93*  GLUCOSE 116*   Lab Results  Component Value Date   CKTOTAL 74 10/25/2010   CKMB 1.1 10/25/2010   TROPONINI  Value: 0.01        NO INDICATION OF MYOCARDIAL INJURY. 10/25/2010   No results found for this basename: PTT   Lab Results  Component Value Date   INR 1.0 09/10/2008     Lab Results  Component Value Date   CHOL  Value: 252        ATP III CLASSIFICATION:  <200     mg/dL   Desirable  161-096  mg/dL   Borderline High  >=045    mg/dL   High       * 40/98/1191   Lab Results  Component Value Date   HDL 39* 10/25/2010   Lab Results  Component Value Date   LDLCALC  Value: 146        Total Cholesterol/HDL:CHD Risk Coronary Heart Disease Risk Table                     Men   Women  1/2 Average Risk   3.4    3.3  Average Risk       5.0   4.4  2 X Average Risk   9.6   7.1  3 X Average Risk  23.4   11.0        Use the calculated Patient Ratio above and the CHD Risk Table to determine the patient's CHD Risk.        ATP III CLASSIFICATION (LDL):  <100     mg/dL   Optimal  478-295  mg/dL   Near or Above                    Optimal  130-159  mg/dL   Borderline  621-308  mg/dL   High  >657     mg/dL   Very High* 84/69/6295   Lab Results  Component Value Date   TRIG 337* 10/25/2010   Lab Results  Component Value Date   CHOLHDL 6.5 10/25/2010   No results found for this basename: LDLDIRECT      Radiology:  @[id number] @  EKG:  NSR, NSST diffusely  ASSESSMENT:  possible unstable angina.  However, chest discomfort is different than what he had with his prior ischemia.  ECG and enzymes are negative.  PLAN:  Rule out MI with enzymes.  Watch on telemetry.  If he does rule out, I don't think he requires any inpatient testing.  Some of his symptoms may be stress related since he is under significant job stress.  He is a requested something to help with his nerves.  Will not heparinize at this time since there is no objective evidence of ischemia.  Also, his blood pressure has been somewhat elevated.  Hypertension: We'll start lisinopril 10 mg daily to help with blood pressure lowering.  He does have a mildly elevated glucose and given his history of coronary disease, an ACE inhibitor may help with multiple issues.  Hyperlipidemia. He has  been intolerant of statins.  His LFTs are mildly elevated today.  In June, his ALT was 98 and his AST was 70.  LFTs are slightly higher today.  There is also a note made on his chest x-ray about pulmonary edema.  He is not feel short of breath.  He certainly could have some degree of diastolic dysfunction with his elevated blood pressure.  We'll repeat PA and lateral chest x-ray to better evaluate his lungs.  Consider a dose of IV Lasix if he can be weaned from his  oxygen.  Corky Crafts., MD  08/25/2012  11:25 AM

## 2012-08-25 NOTE — ED Notes (Signed)
Pt states he has been under a lot of stress-no chest pain at this time-states he just feels light headed.  Last visit to the cardiologist was 4-5 months ago.

## 2012-08-25 NOTE — Progress Notes (Signed)
Dr.Varanasi paged and made aware pt arrived to floor

## 2012-08-25 NOTE — Progress Notes (Signed)
pts d-dimer 1.39, pt without c/o CP or SOB, Dr.Varanasi paged and made aware; no new orders received

## 2012-08-25 NOTE — ED Notes (Signed)
MD at bedside., Pt in no apparent distress.

## 2012-08-25 NOTE — Progress Notes (Signed)
Patient had a one second episode of left arm pain while BP was being taken.  He did not want SQ heparin because he felt he had low BP with heparin and NTG in the past.  He wants a test to evaluate for blood clot.  WIll check D-dimer.

## 2012-08-25 NOTE — ED Provider Notes (Signed)
History     CSN: [id number]  Arrival date & time 08/25/12  0414   First MD Initiated Contact with Patient 08/25/12 0435      Chief Complaint  Patient presents with  . Chest Pain    (Consider location/radiation/quality/duration/timing/severity/associated sxs/prior treatment) HPI HX per PT. Woke tonight with L sided CP radiating to L Arm. Gets SOB with pain but not otherwise, onset at rest. Took ASA with pain and by the time he gets here, pain has resolved, no h/o MI. No diaphoresis. Mod in severity. Some mild inc ankle swelling. No h/o CHF. HA sH/o CABG 2006, has had stress test since then but denies any other cardiac issues. Followed by Mendel Ryder, CAR. Currently pain free.  Past Medical History  Diagnosis Date  . Hypertension   . Coronary artery disease   . High cholesterol   . Anxiety   . Panic attacks     Past Surgical History  Procedure Date  . Coronary artery bypass graft 01/2005    No family history on file.  History  Substance Use Topics  . Smoking status: Never Smoker   . Smokeless tobacco: Not on file  . Alcohol Use: Yes      Review of Systems  Constitutional: Negative for fever and chills.  HENT: Negative for neck pain and neck stiffness.   Eyes: Negative for pain.  Respiratory: Negative for cough and wheezing.   Cardiovascular: Positive for chest pain and leg swelling.  Gastrointestinal: Negative for abdominal pain.  Genitourinary: Negative for dysuria.  Musculoskeletal: Negative for back pain.  Skin: Negative for rash.  Neurological: Negative for headaches.  All other systems reviewed and are negative.    Allergies  Codeine  Home Medications   Current Outpatient Rx  Name Route Sig Dispense Refill  . ASPIRIN EC 325 MG PO TBEC Oral Take 975 mg by mouth once.    . ASPIRIN EC 325 MG PO TBEC Oral Take 325 mg by mouth every morning.    Marland Kitchen METOPROLOL SUCCINATE ER 25 MG PO TB24 Oral Take 25 mg by mouth daily.      Marland Kitchen NITROGLYCERIN 0.4 MG SL SUBL  Sublingual Place 0.4 mg under the tongue every 5 (five) minutes x 3 doses as needed. For chest pain      BP 124/73  Pulse 56  Temp 98.1 F (36.7 C) (Oral)  Resp 16  Ht 5' 11.75" (1.822 m)  Wt 245 lb (111.131 kg)  BMI 33.46 kg/m2  SpO2 98%  Physical Exam  Constitutional: He is oriented to person, place, and time. He appears well-developed and well-nourished.  HENT:  Head: Normocephalic and atraumatic.  Eyes: Conjunctivae normal and EOM are normal. Pupils are equal, round, and reactive to light.  Neck: Trachea normal. Neck supple. No thyromegaly present.  Cardiovascular: Normal rate, regular rhythm, S1 normal, S2 normal and normal pulses.     No systolic murmur is present   No diastolic murmur is present  Pulses:      Radial pulses are 2+ on the right side, and 2+ on the left side.  Pulmonary/Chest: Effort normal and breath sounds normal. He has no wheezes. He has no rhonchi. He has no rales. He exhibits no tenderness.  Abdominal: Soft. Normal appearance and bowel sounds are normal. There is no tenderness. There is no CVA tenderness and negative Murphy's sign.  Musculoskeletal:       BLE:s Calves nontender, no cords or erythema, negative Homans sign  Neurological: He is alert and oriented  to person, place, and time. He has normal strength. No cranial nerve deficit or sensory deficit. GCS eye subscore is 4. GCS verbal subscore is 5. GCS motor subscore is 6.  Skin: Skin is warm and dry. No rash noted. He is not diaphoretic.  Psychiatric: His speech is normal.       Cooperative and appropriate    ED Course  Procedures (including critical care time)  Results for orders placed during the hospital encounter of 08/25/12  CBC WITH DIFFERENTIAL      Component Value Range   WBC 5.8  4.0 - 10.5 K/uL   RBC 4.91  4.22 - 5.81 MIL/uL   Hemoglobin 15.1  13.0 - 17.0 g/dL   HCT 16.1  09.6 - 04.5 %   MCV 89.8  78.0 - 100.0 fL   MCH 30.8  26.0 - 34.0 pg   MCHC 34.2  30.0 - 36.0 g/dL   RDW  40.9  81.1 - 91.4 %   Platelets 134 (*) 150 - 400 K/uL   Neutrophils Relative 49  43 - 77 %   Neutro Abs 2.9  1.7 - 7.7 K/uL   Lymphocytes Relative 40  12 - 46 %   Lymphs Abs 2.3  0.7 - 4.0 K/uL   Monocytes Relative 9  3 - 12 %   Monocytes Absolute 0.5  0.1 - 1.0 K/uL   Eosinophils Relative 1  0 - 5 %   Eosinophils Absolute 0.1  0.0 - 0.7 K/uL   Basophils Relative 0  0 - 1 %   Basophils Absolute 0.0  0.0 - 0.1 K/uL  COMPREHENSIVE METABOLIC PANEL      Component Value Range   Sodium 134 (*) 135 - 145 mEq/L   Potassium 3.8  3.5 - 5.1 mEq/L   Chloride 97  96 - 112 mEq/L   CO2 25  19 - 32 mEq/L   Glucose, Bld 116 (*) 70 - 99 mg/dL   BUN 15  6 - 23 mg/dL   Creatinine, Ser 7.82  0.50 - 1.35 mg/dL   Calcium 9.2  8.4 - 95.6 mg/dL   Total Protein 7.9  6.0 - 8.3 g/dL   Albumin 3.7  3.5 - 5.2 g/dL   AST 93 (*) 0 - 37 U/L   ALT 116 (*) 0 - 53 U/L   Alkaline Phosphatase 55  39 - 117 U/L   Total Bilirubin 0.5  0.3 - 1.2 mg/dL   GFR calc non Af Amer 77 (*) >90 mL/min   GFR calc Af Amer 89 (*) >90 mL/min  POCT I-STAT TROPONIN I      Component Value Range   Troponin i, poc 0.00  0.00 - 0.08 ng/mL   Comment 3            Dg Chest Portable 1 View  08/25/2012  *RADIOLOGY REPORT*  Clinical Data: Chest pain.  Hypertension.  PORTABLE CHEST - 1 VIEW  Comparison: 03/07/2012.  Findings: CABG/median sternotomy.  Low volume chest.  Cardiomegaly. Interstitial and mild alveolar pulmonary edema is present. Monitoring leads are projected over the chest.  IMPRESSION: Mild to moderate CHF.  Low volume chest.   Original Report Authenticated By: Andreas Newport, M.D.      Date: 08/25/2012  Rate: 77  Rhythm: normal sinus rhythm  QRS Axis: normal  Intervals: normal  ST/T Wave abnormalities: nonspecific ST/T changes  Conduction Disutrbances:none  Narrative Interpretation:   Old EKG Reviewed: unchanged  7:45 AM has had return of pain lasting about  10 minutes. CAR consult obtained and d/w Dr Eldridge Dace who  accepts PT in Tx to [facility] for admit tele.   MDM   CP with h/o CAD. ECG. CXR. Labs and CAR consult. Transferred to [facility] for admit. ASA PTA. NTG prn.         Sunnie Nielsen, MD 08/25/12 [phone]

## 2012-08-25 NOTE — ED Notes (Signed)
Pt reports that he felt "stressed" for a moment and "my heart just beat" Denies chest pain or pressure, but spouse gave patient a SL Nitro from home meds. Patient continues to deny chest pain or pressure or ay odd sensation in chest. Education provided.

## 2012-08-26 ENCOUNTER — Observation Stay (HOSPITAL_COMMUNITY): Payer: BC Managed Care – PPO

## 2012-08-26 DIAGNOSIS — R079 Chest pain, unspecified: Secondary | ICD-10-CM | POA: Diagnosis not present

## 2012-08-26 DIAGNOSIS — I2510 Atherosclerotic heart disease of native coronary artery without angina pectoris: Secondary | ICD-10-CM | POA: Diagnosis not present

## 2012-08-26 DIAGNOSIS — R799 Abnormal finding of blood chemistry, unspecified: Secondary | ICD-10-CM | POA: Diagnosis not present

## 2012-08-26 DIAGNOSIS — R6889 Other general symptoms and signs: Secondary | ICD-10-CM | POA: Diagnosis not present

## 2012-08-26 LAB — BASIC METABOLIC PANEL
BUN: 15 mg/dL (ref 6–23)
Creatinine, Ser: 0.87 mg/dL (ref 0.50–1.35)
GFR calc non Af Amer: 89 mL/min — ABNORMAL LOW (ref >90)
Glucose, Bld: 131 mg/dL — ABNORMAL HIGH (ref 70–99)
Potassium: 4.6 mEq/L (ref 3.5–5.1)

## 2012-08-26 LAB — LIPID PANEL
Cholesterol: 251 mg/dL — ABNORMAL HIGH (ref 0–200)
HDL: 36 mg/dL — ABNORMAL LOW
LDL Cholesterol: 159 mg/dL — ABNORMAL HIGH (ref 0–99)
Total CHOL/HDL Ratio: 7 ratio
Triglycerides: 282 mg/dL — ABNORMAL HIGH
VLDL: 56 mg/dL — ABNORMAL HIGH (ref 0–40)

## 2012-08-26 LAB — CK TOTAL AND CKMB (NOT AT ARMC)
CK, MB: 1.7 ng/mL (ref 0.3–4.0)
Relative Index: 1.4 (ref 0.0–2.5)
Total CK: 125 U/L (ref 7–232)

## 2012-08-26 LAB — TROPONIN I: Troponin I: <0.3 ng/mL (ref <0.30)

## 2012-08-26 MED ORDER — LISINOPRIL 10 MG PO TABS: 10.0000 mg | ORAL_TABLET | Freq: Every day | ORAL | Status: DC

## 2012-08-26 MED ORDER — IOHEXOL 350 MG/ML SOLN
75.0000 mL | Freq: Once | INTRAVENOUS | Status: AC | PRN
  Administered 2012-08-26: 75 mL via INTRAVENOUS

## 2012-08-26 NOTE — Progress Notes (Signed)
SUBJECTIVE:  Feeling good with no further chest pain OBJECTIVE:   Vitals:   Filed Vitals:   08/25/12 1036 08/25/12 1358 08/25/12 2130 08/26/12 0530  BP: 172/87 163/93 135/70 128/66  Pulse: 63 57 58 51  Temp: 97.9 F (36.6 C) 97.5 F (36.4 C) 98.1 F (36.7 C) 98.2 F (36.8 C)  TempSrc: Oral Oral    Resp: 18 18 16 16   Height:      Weight: 111.131 kg (245 lb)     SpO2: 98% 93% 98% 96%   I&O's:   Intake/Output Summary (Last 24 hours) at 08/26/12 1610 Last data filed at 08/26/12 9604  Gross per 24 hour  Intake    960 ml  Output      0 ml  Net    960 ml   TELEMETRY: Reviewed telemetry pt in NSR     PHYSICAL EXAM General: Well developed, well nourished, in no acute distress Head: Eyes PERRLA, No xanthomas.   Normal cephalic and atramatic  Lungs:   Clear bilaterally to auscultation and percussion. Heart:   HRRR S1 S2 Pulses are 2+ & equal. Abdomen: Bowel sounds are positive, abdomen soft and non-tender without masses  Extremities:   No clubbing, cyanosis or edema.  DP +1 Neuro: Alert and oriented X 3. Psych:  Good affect, responds appropriately   LABS: Basic Metabolic Panel:  Basename 08/26/12 0500 08/25/12 1142 08/25/12 0515  NA 134* -- 134*  K 4.6 -- 3.8  CL 101 -- 97  CO2 21 -- 25  GLUCOSE 131* -- 116*  BUN 15 -- 15  CREATININE 0.87 0.84 --  CALCIUM 9.2 -- 9.2  MG -- -- --  PHOS -- -- --   Liver Function Tests:  [facility] 08/25/12 0515  AST 93*  ALT 116*  ALKPHOS 55  BILITOT 0.5  PROT 7.9  ALBUMIN 3.7   No results found for this basename: LIPASE:2,AMYLASE:2 in the last 72 hours CBC:  Basename 08/25/12 1142 08/25/12 0515  WBC 5.6 5.8  NEUTROABS -- 2.9  HGB 15.5 15.1  HCT 45.6 44.1  MCV 89.9 89.8  PLT 151 134*   Cardiac Enzymes:  Basename 08/26/12 0144 08/25/12 2110 08/25/12 1400  CKTOTAL 125 146 201  CKMB 1.7 1.8 1.9  CKMBINDEX -- -- --  TROPONINI <0.30 <0.30 <0.30   BNP: No components found with this basename:  POCBNP:3 D-Dimer:  Basename 08/25/12 1400  DDIMER 1.39*   Hemoglobin A1C:  Basename 08/25/12 1142  HGBA1C 5.9*   Fasting Lipid Panel:  Basename 08/26/12 0500  CHOL 251*  HDL 36*  LDLCALC 159*  TRIG 282*  CHOLHDL 7.0  LDLDIRECT --  Coag Panel:   Lab Results  Component Value Date   INR 1.08 08/25/2012   INR 1.0 09/10/2008    RADIOLOGY: Dg Chest 2 View  08/25/2012  *RADIOLOGY REPORT*  Clinical Data: Chest discomfort.  Coronary artery disease.  CHEST - 2 VIEW  Comparison: 08/25/2012  Findings: Improved aeration of both lungs is seen.  Both lungs are clear.  There is mild scarring again seen in the lingula.  No evidence of pleural effusion.  Heart size is normal.  No mass or lymphadenopathy identified.  Prior CABG again noted.  IMPRESSION:  Mild lingular scarring.  No active disease.   Original Report Authenticated By: Danae Orleans, M.D.    Dg Chest Portable 1 View  08/25/2012  *RADIOLOGY REPORT*  Clinical Data: Chest pain.  Hypertension.  PORTABLE CHEST - 1 VIEW  Comparison: 03/07/2012.  Findings:  CABG/median sternotomy.  Low volume chest.  Cardiomegaly. Interstitial and mild alveolar pulmonary edema is present. Monitoring leads are projected over the chest.  IMPRESSION: Mild to moderate CHF.  Low volume chest.   Original Report Authenticated By: Andreas Newport, M.D.       ASSESSMENT:  1.  Chest pain with negative enzymes x 2 2.  HTN 3.  Dyslipidemia - statin intolerant 4.  CAD s/p CABG 5.  Anxiety 6.  Elevated D-dimer  PLAN:   1.  Check chest CT with contrast to rule out PE 2.  D/C home if chest CT negative for PE and followup with Dr. Eldridge Dace as outpt  Quintella Reichert, MD  08/26/2012  8:33 AM

## 2012-08-26 NOTE — Discharge Summary (Signed)
Patient ID: [name] MRN: [id number] DOB/AGE: 65-12-1945 65 y.o.  Admit date: 08/25/2012 Discharge date: 08/26/2012  Primary Discharge Diagnosis Noncardiac chest pain  Secondary Discharge Diagnosis  CAD sp/ CABG  HTN  Dyslipidemia statin intolerant  Anxiety/panic attacks   Significant Diagnostic Studies:chest CT:    *RADIOLOGY REPORT*  Clinical Data: Chest pain, elevated D-dimer  CT ANGIOGRAPHY CHEST  Technique: Multidetector CT imaging of the chest using the  standard protocol during bolus administration of intravenous  contrast. Multiplanar reconstructed images including MIPs were  obtained and reviewed to evaluate the vascular anatomy.  Contrast: 75mL OMNIPAQUE IOHEXOL 350 MG/ML SOLN  Comparison: Chest radiographs dated 08/25/2012  Findings: No evidence of pulmonary embolism.  Evaluation of the lung parenchyma is constrained by respiratory  motion. Mildly increased interstitial markings, reflecting the  scattered atelectasis related to expiratory imaging. No pleural  effusion or pneumothorax.  Visualized thyroid is unremarkable.  The heart is normal in size. No pericardial effusion. Coronary  atherosclerosis. Postsurgical changes related to prior CABG.  No suspicious mediastinal, hilar, or axillary lymphadenopathy.  Visualized upper abdomen is notable for mild hepatic steatosis.  Visualized osseous structures are within normal limits.  IMPRESSION:  No evidence of pulmonary embolism.  No evidence of acute cardiopulmonary disease.  Original Report Authenticated By: Charline Bills, M.D.   Consults: None  Hospital Course:   This is a 66yo male with a history of CAD s/p remote CABG, HTN, dyslipidemia, anxiety and panic attacks who was admitted with complaints of left sided CP.  He had ben under a lot of stress for the past 2 weeks and had not been sleeping well.  He thought it was a muscle spasm but improved with aspirin.  He was admittetd and ruled out for MI by  serial cardiac enzymes.  His ddimer was elevated so a chest CT was done which showed no acute cardiopulmonary disease and no PE.  He was started on an AceI for BP control.  He was discharged to home in stable condition and instructed to call for a follow up apppointe ment with Dr. Eldridge Dace in a week.  He had no further CP during his hospitalization after admission.   Discharge Exam:    General appearance: alert HEENT:  Benign LUNG:  CTA COR:  RRR no M/R/G EXT:  No cyanosis/edema or erythema Labs:   Lab Results  Component Value Date   WBC 5.6 08/25/2012   HGB 15.5 08/25/2012   HCT 45.6 08/25/2012   MCV 89.9 08/25/2012   PLT 151 08/25/2012    Lab 08/26/12 0500 08/25/12 0515  NA 134* --  K 4.6 --  CL 101 --  CO2 21 --  BUN 15 --  CREATININE 0.87 --  CALCIUM 9.2 --  PROT -- 7.9  BILITOT -- 0.5  ALKPHOS -- 55  ALT -- 116*  AST -- 93*  GLUCOSE 131* --   Lab Results  Component Value Date   CKTOTAL 125 08/26/2012   CKMB 1.7 08/26/2012   TROPONINI <0.30 08/26/2012    Lab Results  Component Value Date   CHOL 251* 08/26/2012   CHOL  Value: 252        ATP III CLASSIFICATION:  <200     mg/dL   Desirable  119-147  mg/dL   Borderline High  >=829    mg/dL   High       * 56/21/3086   Lab Results  Component Value Date   HDL 36* 08/26/2012   HDL 39* 10/25/2010  Lab Results  Component Value Date   LDLCALC 159* 08/26/2012   LDLCALC  Value: 146        Total Cholesterol/HDL:CHD Risk Coronary Heart Disease Risk Table                     Men   Women  1/2 Average Risk   3.4   3.3  Average Risk       5.0   4.4  2 X Average Risk   9.6   7.1  3 X Average Risk  23.4   11.0        Use the calculated Patient Ratio above and the CHD Risk Table to determine the patient's CHD Risk.        ATP III CLASSIFICATION (LDL):  <100     mg/dL   Optimal  782-956  mg/dL   Near or Above                    Optimal  130-159  mg/dL   Borderline  213-086  mg/dL   High  >578     mg/dL   Very High* 46/96/2952   Lab Results   Component Value Date   TRIG 282* 08/26/2012   TRIG 337* 10/25/2010   Lab Results  Component Value Date   CHOLHDL 7.0 08/26/2012   CHOLHDL 6.5 10/25/2010   No results found for this basename: LDLDIRECT       EKG:  NSR with nonspecific T wave abnormality  FOLLOW UP PLANS AND APPOINTMENTS Discharge Orders    Future Orders Please Complete By Expires   Diet - low sodium heart healthy      Diet - low sodium heart healthy      Increase activity slowly      Call MD for:  temperature >100.4      Call MD for:  extreme fatigue      Call MD for:  difficulty breathing, headache or visual disturbances      Increase activity slowly          Medication List     As of 08/26/2012  9:39 PM    TAKE these medications         aspirin EC 325 MG tablet   Take 325 mg by mouth every morning.      lisinopril 10 MG tablet   Commonly known as: PRINIVIL,ZESTRIL   Take 1 tablet (10 mg total) by mouth daily.      metoprolol succinate 25 MG 24 hr tablet   Commonly known as: TOPROL-XL   Take 25 mg by mouth daily.      nitroGLYCERIN 0.4 MG SL tablet   Commonly known as: NITROSTAT   Place 0.4 mg under the tongue every 5 (five) minutes x 3 doses as needed. For chest pain           Follow-up Information    Follow up with Corky Crafts., MD. Call on 08/28/2012. (call to make an appt with Dr. Eldridge Dace in 1 week)    Contact information:   [address] [place] [address] [phone]          BRING ALL MEDICATIONS WITH YOU TO FOLLOW UP APPOINTMENTS  Time spent with patient to include physician time 45 minutes Signed: Quintella Reichert 08/26/2012, 9:39 PM

## 2012-09-02 ENCOUNTER — Encounter (HOSPITAL_COMMUNITY): Payer: Self-pay | Admitting: *Deleted

## 2012-09-02 ENCOUNTER — Emergency Department (HOSPITAL_COMMUNITY)
Admission: EM | Admit: 2012-09-02 | Discharge: 2012-09-02 | Disposition: A | Discharge status: Home / Self Care | Payer: BC Managed Care – PPO | Attending: Emergency Medicine | Admitting: Emergency Medicine

## 2012-09-02 DIAGNOSIS — I440 Atrioventricular block, first degree: Secondary | ICD-10-CM | POA: Insufficient documentation

## 2012-09-02 DIAGNOSIS — F411 Generalized anxiety disorder: Secondary | ICD-10-CM | POA: Insufficient documentation

## 2012-09-02 DIAGNOSIS — Z886 Allergy status to analgesic agent status: Secondary | ICD-10-CM | POA: Insufficient documentation

## 2012-09-02 DIAGNOSIS — F4310 Post-traumatic stress disorder, unspecified: Secondary | ICD-10-CM | POA: Insufficient documentation

## 2012-09-02 DIAGNOSIS — Z951 Presence of aortocoronary bypass graft: Secondary | ICD-10-CM | POA: Insufficient documentation

## 2012-09-02 DIAGNOSIS — E7800 Pure hypercholesterolemia, unspecified: Secondary | ICD-10-CM | POA: Insufficient documentation

## 2012-09-02 DIAGNOSIS — I10 Essential (primary) hypertension: Secondary | ICD-10-CM

## 2012-09-02 DIAGNOSIS — M1710 Unilateral primary osteoarthritis, unspecified knee: Secondary | ICD-10-CM | POA: Insufficient documentation

## 2012-09-02 DIAGNOSIS — F17200 Nicotine dependence, unspecified, uncomplicated: Secondary | ICD-10-CM | POA: Insufficient documentation

## 2012-09-02 DIAGNOSIS — F419 Anxiety disorder, unspecified: Secondary | ICD-10-CM

## 2012-09-02 DIAGNOSIS — I2510 Atherosclerotic heart disease of native coronary artery without angina pectoris: Secondary | ICD-10-CM | POA: Insufficient documentation

## 2012-09-02 MED ORDER — ALPRAZOLAM 0.5 MG PO TABS: 0.5000 mg | ORAL_TABLET | Freq: Every evening | ORAL | Status: DC | PRN

## 2012-09-02 NOTE — ED Notes (Signed)
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 Pt states that he normally takes lisinopril and metoprolol and that he took himself off his metoprolol for 4-5 days because it was making him feel tired. Pt states that at [facility] his BP read 200/103 and he was worried. Pt took his metoprolol and a SL nitro for the BP not having CP. Pt BP currently lowered at this time. Pt alert and oriented, able to follow commands and move all extremities.

## 2012-09-02 NOTE — ED Notes (Signed)
The pts bp has been elevated all day.  He was in the hospital one week ago for the same.  No pain

## 2012-09-02 NOTE — ED Notes (Signed)
Old and New EKG given to Dr Miller 

## 2012-09-02 NOTE — ED Provider Notes (Signed)
History     CSN: [id number]  Arrival date & time 09/02/12  0219   First MD Initiated Contact with Patient 09/02/12 0243      Chief Complaint  Patient presents with  . Hypertension    (Consider location/radiation/quality/duration/timing/severity/associated sxs/prior treatment) HPI Comments: 66 year old male with a history of hypertension who presents approximately one week after being admitted to the hospital for chest pain and subsequently been ruled out for a heart attack. He states that he was started on lisinopril for better blood pressure control during that admission but since taking the medication at home he has developed lightheadedness. He has discontinued the lisinopril and has been taking only the metoprolol. His symptoms have improved however he does note mild hypertension at home. He also notes being anxious and feels this every day in relation to the stress of his job. He has a followup with his family doctor for blood pressure check in the next week. He denies chest pain, shortness of breath, weakness, numbness, difficulty ambulating.  Patient is a 66 y.o. male presenting with hypertension. The history is provided by the patient and medical records.  Hypertension    Past Medical History  Diagnosis Date  . Hypertension   . Coronary artery disease   . High cholesterol   . Anxiety   . Panic attacks   . Anginal pain 01/2005  . History of bronchitis     "used to get it alot" (08/25/2012)  . Arthritis     "knees"  . Chronic lower back pain   . PTSD (post-traumatic stress disorder)     "have been treated in the past" (08/25/2012)    Past Surgical History  Procedure Date  . Coronary artery bypass graft 01/2005    CABG X3  . Cardiac catheterization 01/2005  . Varicose vein surgery 1980's    LLE  . Shrapnel 1969    LLE; left lateral thumb (required grafting)    No family history on file.  History  Substance Use Topics  . Smoking status: Current Every Day Smoker --  2.0 packs/day for 5 years    Types: Cigarettes  . Smokeless tobacco: Never Used   Comment: 08/25/2012 "quit smoking cigarettes 40 yr ago"  . Alcohol Use: Yes      Review of Systems  All other systems reviewed and are negative.    Allergies  Codeine  Home Medications   Current Outpatient Rx  Name Route Sig Dispense Refill  . ALPRAZOLAM 0.5 MG PO TABS Oral Take 1 tablet (0.5 mg total) by mouth at bedtime as needed for sleep. 10 tablet 0  . ASPIRIN EC 325 MG PO TBEC Oral Take 325 mg by mouth every morning.    Marland Kitchen METOPROLOL SUCCINATE ER 25 MG PO TB24 Oral Take 25 mg by mouth daily.      Marland Kitchen NITROGLYCERIN 0.4 MG SL SUBL Sublingual Place 0.4 mg under the tongue every 5 (five) minutes x 3 doses as needed. For chest pain      BP 170/81  Pulse 85  Temp 98.7 F (37.1 C) (Oral)  Resp 18  SpO2 96%  Physical Exam  Nursing note and vitals reviewed. Constitutional: He appears well-developed and well-nourished. No distress.  HENT:  Head: Normocephalic and atraumatic.  Mouth/Throat: Oropharynx is clear and moist. No oropharyngeal exudate.  Eyes: Conjunctivae normal and EOM are normal. Pupils are equal, round, and reactive to light. Right eye exhibits no discharge. Left eye exhibits no discharge. No scleral icterus.  Neck: Normal range  of motion. Neck supple. No JVD present. No thyromegaly present.  Cardiovascular: Normal rate, regular rhythm, normal heart sounds and intact distal pulses.  Exam reveals no gallop and no friction rub.   No murmur heard. Pulmonary/Chest: Effort normal and breath sounds normal. No respiratory distress. He has no wheezes. He has no rales.  Abdominal: Soft. Bowel sounds are normal. He exhibits no distension and no mass. There is no tenderness.  Musculoskeletal: Normal range of motion. He exhibits no edema and no tenderness.  Lymphadenopathy:    He has no cervical adenopathy.  Neurological: He is alert. Coordination normal.  Skin: Skin is warm and dry. No rash  noted. No erythema.  Psychiatric: He has a normal mood and affect. His behavior is normal.    ED Course  Procedures (including critical care time)  Labs Reviewed  GLUCOSE, CAPILLARY - Abnormal; Notable for the following:    Glucose-Capillary 117 (*)     All other components within normal limits   No results found.   1. Hypertension   2. Anxiety       MDM  At this time the patient is not appear overly anxious, I agree with stopping lisinopril and continuing with metoprolol. He can followup with family doctor for recheck as his blood pressure is currently 170/80 and he has no signs of endorgan damage on exam nor on his EKG. The patient appears stable for discharge.  ED ECG REPORT  I personally interpreted this EKG   Date: 09/02/2012   Rate: 80  Rhythm: normal sinus rhythm  QRS Axis: normal  Intervals: PR prolonged  ST/T Wave abnormalities: normal  Conduction Disutrbances:first-degree A-V block   Narrative Interpretation:   Old EKG Reviewed: Compared with 08/26/2012, PR interval is slightly prolonged         Vida Roller, MD 09/02/12 0302

## 2012-09-02 NOTE — ED Notes (Signed)
CBG checked 117 

## 2012-10-22 ENCOUNTER — Emergency Department (INDEPENDENT_AMBULATORY_CARE_PROVIDER_SITE_OTHER)
Admission: EM | Admit: 2012-10-22 | Discharge: 2012-10-22 | Disposition: A | Discharge status: Home / Self Care | Payer: Medicare Other | Source: Home / Self Care

## 2012-10-22 ENCOUNTER — Encounter (HOSPITAL_COMMUNITY): Payer: Self-pay

## 2012-10-22 DIAGNOSIS — F419 Anxiety disorder, unspecified: Secondary | ICD-10-CM

## 2012-10-22 DIAGNOSIS — F4310 Post-traumatic stress disorder, unspecified: Secondary | ICD-10-CM

## 2012-10-22 DIAGNOSIS — G4700 Insomnia, unspecified: Secondary | ICD-10-CM

## 2012-10-22 DIAGNOSIS — F411 Generalized anxiety disorder: Secondary | ICD-10-CM

## 2012-10-22 DIAGNOSIS — F329 Major depressive disorder, single episode, unspecified: Secondary | ICD-10-CM | POA: Diagnosis not present

## 2012-10-22 MED ORDER — DIAZEPAM 5 MG PO TABS: 5.0000 mg | ORAL_TABLET | Freq: Three times a day (TID) | ORAL | Status: DC | PRN

## 2012-10-22 NOTE — ED Notes (Addendum)
Has papers from government d/c, PTSD diagnosis; out of treatment at present, needs assistance for his depression

## 2012-10-22 NOTE — ED Provider Notes (Signed)
Medical screening examination/treatment/procedure(s) were performed by non-physician practitioner and as supervising physician I was immediately available for consultation/collaboration.  Leslee Home, M.D.   Reuben Likes, MD 10/22/12 [phone]
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
## 2012-10-23 ENCOUNTER — Telehealth (HOSPITAL_COMMUNITY): Payer: Self-pay | Admitting: Emergency Medicine

## 2012-10-23 NOTE — ED Notes (Signed)
Joe from [facility] called to verify Valium RX since it was not signed.  RX verified and pharmacist did not have any further questions

## 2012-11-05 ENCOUNTER — Ambulatory Visit (INDEPENDENT_AMBULATORY_CARE_PROVIDER_SITE_OTHER): Payer: Medicare Other | Admitting: Family Medicine

## 2012-11-05 VITALS — BP 160/84 | HR 90 | Temp 98.6°F | Resp 16 | Ht 71.5 in | Wt 246.0 lb

## 2012-11-05 DIAGNOSIS — J209 Acute bronchitis, unspecified: Secondary | ICD-10-CM | POA: Diagnosis not present

## 2012-11-05 DIAGNOSIS — J40 Bronchitis, not specified as acute or chronic: Secondary | ICD-10-CM

## 2012-11-05 MED ORDER — AZITHROMYCIN 250 MG PO TABS: ORAL_TABLET | ORAL | Status: DC

## 2012-11-05 NOTE — Progress Notes (Signed)
Patient ID: SIMS LADAY MRN: [id number], DOB: 03-May-1946, 66 y.o. Date of Encounter: 11/05/2012, 4:01 PM  Primary Physician: No Pcp Per Pt  Chief Complaint:  Chief Complaint  Patient presents with  . Sore Throat    1 week, feels terrible  . Cough  . Nasal Congestion    HPI: 66 y.o. year old male presents with a 5 day history of nasal congestion, post nasal drip, sore throat, and cough. Mild sinus pressure. Afebrile. No chills. Nasal congestion thick and green/yellow. Cough is productive of green/yellow sputum and not associated with time of day. Ears feel full, leading to sensation of muffled hearing. Has tried OTC cold preps without success. No GI complaints.   No sick contacts, recent antibiotics, or recent travels.   No leg trauma, sedentary periods, h/o cancer, or tobacco use.  Past Medical History  Diagnosis Date  . Hypertension   . Coronary artery disease   . High cholesterol   . Anxiety   . Panic attacks   . Anginal pain 01/2005  . History of bronchitis     "used to get it alot" (08/25/2012)  . Arthritis     "knees"  . Chronic lower back pain   . PTSD (post-traumatic stress disorder)     "have been treated in the past" (08/25/2012)  . Depression      Home Meds: Prior to Admission medications   Medication Sig Start Date End Date Taking? Authorizing Provider  aspirin EC 325 MG tablet Take 325 mg by mouth every morning.   Yes Historical Provider, MD  metoprolol succinate (TOPROL-XL) 25 MG 24 hr tablet Take 25 mg by mouth daily.     Yes Historical Provider, MD  nitroGLYCERIN (NITROSTAT) 0.4 MG SL tablet Place 0.4 mg under the tongue every 5 (five) minutes x 3 doses as needed. For chest pain   Yes Historical Provider, MD  ALPRAZolam (XANAX) 0.5 MG tablet Take 1 tablet (0.5 mg total) by mouth at bedtime as needed for sleep. 09/02/12   Vida Roller, MD  diazepam (VALIUM) 5 MG tablet Take 1 tablet (5 mg total) by mouth every 8 (eight) hours as needed for anxiety.  10/22/12   Hayden Rasmussen, NP    Allergies:  Allergies  Allergen Reactions  . Codeine Palpitations  . Vicodin (Hydrocodone-Acetaminophen) Palpitations    History   Social History  . Marital Status: Married    Spouse Name: N/A    Number of Children: N/A  . Years of Education: N/A   Occupational History  . Not on file.   Social History Main Topics  . Smoking status: Current Every Day Smoker -- 2.0 packs/day for 5 years    Types: Cigarettes  . Smokeless tobacco: Never Used     Comment: 08/25/2012 "quit smoking cigarettes 40 yr ago"  . Alcohol Use: Yes  . Drug Use: No     Comment: 08/25/2012 "last marijuana 10 years ago"  . Sexually Active: Yes   Other Topics Concern  . Not on file   Social History Narrative  . No narrative on file     Review of Systems: Constitutional: negative for chills, fever, night sweats or weight changes Cardiovascular: negative for chest pain or palpitations Respiratory: negative for hemoptysis, wheezing, or shortness of breath Abdominal: negative for abdominal pain, nausea, vomiting or diarrhea Dermatological: negative for rash Neurologic: negative for headache   Physical Exam Blood pressure 160/84, pulse 90, temperature 98.6 F (37 C), temperature source Oral, resp. rate 16,  height 5' 11.5" (1.816 m), weight 246 lb (111.585 kg)., Body mass index is 33.83 kg/(m^2). General: Well developed, well nourished, in no acute distress. Head: Normocephalic, atraumatic, eyes without discharge, sclera non-icteric, nares are congested. Bilateral auditory canals clear, TM's are without perforation, pearly grey with reflective cone of light bilaterally. No sinus TTP. Oral cavity moist, dentition normal. Posterior pharynx with post nasal drip and mild erythema. No peritonsillar abscess or tonsillar exudate. Neck: Supple. No thyromegaly. Full ROM. No lymphadenopathy. Lungs: Coarse breath sounds bilaterally without wheezes, rales, or rhonchi. Breathing is unlabored.   Heart: RRR with S1 S2. No murmurs, rubs, or gallops appreciated. Msk:  Strength and tone normal for age. Extremities: No clubbing or cyanosis. No edema. Neuro: Alert and oriented X 3. Moves all extremities spontaneously. CNII-XII grossly in tact. Psych:  Responds to questions appropriately with a normal affect.    ASSESSMENT AND PLAN:  66 y.o. year old male with bronchitis. - -Mucinex -Tylenol/Motrin prn -Rest/fluids -RTC precautions -RTC 3-5 days if no improvement  Signed, Elvina Sidle, MD 11/05/2012 4:01 PM

## 2012-11-07 DIAGNOSIS — N529 Male erectile dysfunction, unspecified: Secondary | ICD-10-CM | POA: Diagnosis not present

## 2012-11-07 DIAGNOSIS — N400 Enlarged prostate without lower urinary tract symptoms: Secondary | ICD-10-CM | POA: Diagnosis not present

## 2013-01-21 ENCOUNTER — Ambulatory Visit (INDEPENDENT_AMBULATORY_CARE_PROVIDER_SITE_OTHER): Payer: Medicare Other | Admitting: Family Medicine

## 2013-01-21 ENCOUNTER — Other Ambulatory Visit: Payer: Self-pay | Admitting: *Deleted

## 2013-01-21 VITALS — BP 163/86 | HR 89 | Temp 98.0°F | Resp 16 | Ht 71.0 in | Wt 248.0 lb

## 2013-01-21 DIAGNOSIS — I10 Essential (primary) hypertension: Secondary | ICD-10-CM | POA: Diagnosis not present

## 2013-01-21 DIAGNOSIS — J40 Bronchitis, not specified as acute or chronic: Secondary | ICD-10-CM

## 2013-01-21 MED ORDER — AZITHROMYCIN 250 MG PO TABS: ORAL_TABLET | ORAL | Status: DC

## 2013-01-21 MED ORDER — DIAZEPAM 5 MG PO TABS: 5.0000 mg | ORAL_TABLET | Freq: Every evening | ORAL | Status: DC | PRN

## 2013-01-21 NOTE — Progress Notes (Signed)
Subjective:    Patient ID: [name], male    DOB: Mar 18, 1946, 67 y.o.   MRN: [id number] Chief Complaint  Patient presents with  . Cough    1 week  . Sinusitis  . Epistaxis    HPI  Sxs started 7d ago with sorethroat about 10d ago and has progressively worsened treated with cepachol lozenges. and just feels miserable - progressively worsening with ear pain, sinus pain, sinus HAs, and chest congestion.  Has developed nose bleeds and last night it was difficult to get it to stop.  Has been using some otc cough drops w/o relief.   Has had some fevers and chills.   Forcing self to eat and drink, a little constipaiton. Nml urination.  Did not get flu shot this yr.  Now has been ill twice this yr - prev had similar sxs - treated w/ zpack for bronchitis with great result but this time his sinuses feel much worse that with prev illnesses.  Past Medical History  Diagnosis Date  . Hypertension   . Coronary artery disease   . High cholesterol   . Anxiety   . Panic attacks   . Anginal pain 01/2005  . History of bronchitis     "used to get it alot" (08/25/2012)  . Arthritis     "knees"  . Chronic lower back pain   . PTSD (post-traumatic stress disorder)     "have been treated in the past" (08/25/2012)  . Depression    Current Outpatient Prescriptions on File Prior to Visit  Medication Sig Dispense Refill  . aspirin EC 325 MG tablet Take 325 mg by mouth every morning.      . metoprolol succinate (TOPROL-XL) 25 MG 24 hr tablet Take 25 mg by mouth daily.        . nitroGLYCERIN (NITROSTAT) 0.4 MG SL tablet Place 0.4 mg under the tongue every 5 (five) minutes x 3 doses as needed. For chest pain      . ALPRAZolam (XANAX) 0.5 MG tablet Take 1 tablet (0.5 mg total) by mouth at bedtime as needed for sleep.  10 tablet  0   No current facility-administered medications on file prior to visit.   Allergies  Allergen Reactions  . Codeine Palpitations  . Vicodin (Hydrocodone-Acetaminophen)  Palpitations      Review of Systems  Constitutional: Positive for fever, chills, diaphoresis, activity change, appetite change and fatigue. Negative for unexpected weight change.  HENT: Positive for ear pain, nosebleeds, congestion, sore throat, rhinorrhea, sneezing, postnasal drip and sinus pressure. Negative for hearing loss, mouth sores, neck pain, neck stiffness and ear discharge.   Respiratory: Positive for cough. Negative for chest tightness and shortness of breath.   Cardiovascular: Negative for chest pain, palpitations and leg swelling.  Gastrointestinal: Positive for constipation. Negative for nausea, vomiting, abdominal pain and diarrhea.  Genitourinary: Negative for dysuria, frequency, decreased urine volume and difficulty urinating.  Musculoskeletal: Negative for myalgias and arthralgias.  Skin: Negative for rash.  Neurological: Positive for headaches. Negative for syncope.  Hematological: Negative for adenopathy.  Psychiatric/Behavioral: Positive for sleep disturbance.      BP 163/86  Pulse 89  Temp(Src) 98 F (36.7 C) (Oral)  Resp 16  Ht 5\' 11"  (1.803 m)  Wt 248 lb (112.492 kg)  BMI 34.6 kg/m2  SpO2 95% Objective:   Physical Exam  Constitutional: He is oriented to person, place, and time. He appears well-developed and well-nourished. No distress.  HENT:  Head: Normocephalic and atraumatic.  Right Ear: External ear and ear canal normal. Tympanic membrane is injected and retracted. A middle ear effusion is present.  Left Ear: External ear and ear canal normal. Tympanic membrane is injected, erythematous and retracted. A middle ear effusion is present.  Nose: Mucosal edema and rhinorrhea present. Right sinus exhibits maxillary sinus tenderness. Left sinus exhibits maxillary sinus tenderness.  Mouth/Throat: Uvula is midline and mucous membranes are normal. Posterior oropharyngeal erythema present. No oropharyngeal exudate or posterior oropharyngeal edema.  Eyes:  Conjunctivae are normal. Right eye exhibits no discharge. Left eye exhibits no discharge. No scleral icterus.  Neck: Normal range of motion. Neck supple. No thyromegaly present.  Cardiovascular: Normal rate, regular rhythm, normal heart sounds and intact distal pulses.   Pulmonary/Chest: Effort normal and breath sounds normal. No respiratory distress.  Lymphadenopathy:       Head (right side): Submandibular adenopathy present.       Head (left side): Submandibular adenopathy present.    He has no cervical adenopathy.       Right: No supraclavicular adenopathy present.       Left: No supraclavicular adenopathy present.  Neurological: He is alert and oriented to person, place, and time.  Skin: Skin is warm and dry. He is not diaphoretic. No erythema.  Psychiatric: He has a normal mood and affect. His behavior is normal.          Assessment & Plan:  Hypertension - stop otc cough/cold meds  Bronchitis - Plan: azithromycin (ZITHROMAX Z-PAK) 250 MG tablet - I recommended to pt that he be treated with amox or augmentin but apparently the zpacks have always worked very well for him in the past so he is adament that he wants to retry this.  Warned pt that may have local resistence up to 1/3 of the time so if he does not improve or worsens, RTC for further eval - pt understands and agrees w/ plan. Refilled valium so he can get some sleep during illness - has used in past w/ good success.  Epistaxis - no acute intervention needed - see pt instructions for management. If cont, rec RTC and can always cons ENT eval.  Meds ordered this encounter  Medications         . azithromycin (ZITHROMAX Z-PAK) 250 MG tablet    Sig: Take as directed on pack    Dispense:  6 tablet    Refill:  0  . diazepam (VALIUM) 5 MG tablet    Sig: Take 1 tablet (5 mg total) by mouth at bedtime as needed for sleep.    Dispense:  15 tablet    Refill:  0

## 2013-01-21 NOTE — Patient Instructions (Signed)
Use nasal saline spray as frequencly as your can throughout the day. If you have trouble with this, you can apply Vaseline or petroleum jelly to the right lower septum.  If the bleeding starts again, pinch your nose shut continuously for at least 5 minutes without break. You may try some Afrin nasal spray as needed for the next 3 days only. Do not use for longer than 3 days.  If you do not feel better in 1 wk or you start to feel worse, please return to clinic as soon as possible.  Nosebleed Nosebleeds can be caused by many conditions including trauma, infections, polyps, foreign bodies, dry mucous membranes or climate, medications and air conditioning. Most nosebleeds occur in the front of the nose. It is because of this location that most nosebleeds can be controlled by pinching the nostrils gently and continuously. Do this for at least 10 to 20 minutes. The reason for this long continuous pressure is that you must hold it long enough for the blood to clot. If during that 10 to 20 minute time period, pressure is released, the process may have to be started again. The nosebleed may stop by itself, quit with pressure, need concentrated heating (cautery) or stop with pressure from packing. HOME CARE INSTRUCTIONS   If your nose was packed, try to maintain the pack inside until your caregiver removes it. If a gauze pack was used and it starts to fall out, gently replace or cut the end off. Do not cut if a balloon catheter was used to pack the nose. Otherwise, do not remove unless instructed.  Avoid blowing your nose for 12 hours after treatment. This could dislodge the pack or clot and start bleeding again.  If the bleeding starts again, sit up and bending forward, gently pinch the front half of your nose continuously for 20 minutes.  If bleeding was caused by dry mucous membranes, cover the inside of your nose every morning with a petroleum or antibiotic ointment. Use your little fingertip as an  applicator. Do this as needed during dry weather. This will keep the mucous membranes moist and allow them to heal.  Maintain humidity in your home by using less air conditioning or using a humidifier.  Do not use aspirin or medications which make bleeding more likely. Your caregiver can give you recommendations on this.  Resume normal activities as able but try to avoid straining, lifting or bending at the waist for several days.  If the nosebleeds become recurrent and the cause is unknown, your caregiver may suggest laboratory tests. SEEK IMMEDIATE MEDICAL CARE IF:   Bleeding recurs and cannot be controlled.  There is unusual bleeding from or bruising on other parts of the body.  You have a fever.  Nosebleeds continue.  There is any worsening of the condition which originally brought you in.  You become lightheaded, feel faint, become sweaty or vomit blood. MAKE SURE YOU:   Understand these instructions.  Will watch your condition.  Will get help right away if you are not doing well or get worse. Document Released: 08/25/2005 Document Revised: 02/07/2012 Document Reviewed: 10/17/2009 [facility] Patient Information 2013 [place], [place].  Sinusitis Sinusitis is redness, soreness, and swelling (inflammation) of the paranasal sinuses. Paranasal sinuses are air pockets within the bones of your face (beneath the eyes, the middle of the forehead, or above the eyes). In healthy paranasal sinuses, mucus is able to drain out, and air is able to circulate through them by way of your  nose. However, when your paranasal sinuses are inflamed, mucus and air can become trapped. This can allow bacteria and other germs to grow and cause infection. Sinusitis can develop quickly and last only a short time (acute) or continue over a long period (chronic). Sinusitis that lasts for more than 12 weeks is considered chronic.  CAUSES  Causes of sinusitis include:  Allergies.  Structural abnormalities,  such as displacement of the cartilage that separates your nostrils (deviated septum), which can decrease the air flow through your nose and sinuses and affect sinus drainage.  Functional abnormalities, such as when the small hairs (cilia) that line your sinuses and help remove mucus do not work properly or are not present. SYMPTOMS  Symptoms of acute and chronic sinusitis are the same. The primary symptoms are pain and pressure around the affected sinuses. Other symptoms include:  Upper toothache.  Earache.  Headache.  Bad breath.  Decreased sense of smell and taste.  A cough, which worsens when you are lying flat.  Fatigue.  Fever.  Thick drainage from your nose, which often is green and may contain pus (purulent).  Swelling and warmth over the affected sinuses. DIAGNOSIS  Your caregiver will perform a physical exam. During the exam, your caregiver may:  Look in your nose for signs of abnormal growths in your nostrils (nasal polyps).  Tap over the affected sinus to check for signs of infection.  View the inside of your sinuses (endoscopy) with a special imaging device with a light attached (endoscope), which is inserted into your sinuses. If your caregiver suspects that you have chronic sinusitis, one or more of the following tests may be recommended:  Allergy tests.  Nasal culture A sample of mucus is taken from your nose and sent to a lab and screened for bacteria.  Nasal cytology A sample of mucus is taken from your nose and examined by your caregiver to determine if your sinusitis is related to an allergy. TREATMENT  Most cases of acute sinusitis are related to a viral infection and will resolve on their own within 10 days. Sometimes medicines are prescribed to help relieve symptoms (pain medicine, decongestants, nasal steroid sprays, or saline sprays).  However, for sinusitis related to a bacterial infection, your caregiver will prescribe antibiotic medicines. These  are medicines that will help kill the bacteria causing the infection.  Rarely, sinusitis is caused by a fungal infection. In theses cases, your caregiver will prescribe antifungal medicine. For some cases of chronic sinusitis, surgery is needed. Generally, these are cases in which sinusitis recurs more than 3 times per year, despite other treatments. HOME CARE INSTRUCTIONS   Drink plenty of water. Water helps thin the mucus so your sinuses can drain more easily.  Use a humidifier.  Inhale steam 3 to 4 times a day (for example, sit in the bathroom with the shower running).  Apply a warm, moist washcloth to your face 3 to 4 times a day, or as directed by your caregiver.  Use saline nasal sprays to help moisten and clean your sinuses.  Take over-the-counter or prescription medicines for pain, discomfort, or fever only as directed by your caregiver. SEEK IMMEDIATE MEDICAL CARE IF:  You have increasing pain or severe headaches.  You have nausea, vomiting, or drowsiness.  You have swelling around your face.  You have vision problems.  You have a stiff neck.  You have difficulty breathing. MAKE SURE YOU:   Understand these instructions.  Will watch your condition.  Will get  help right away if you are not doing well or get worse. Document Released: 11/15/2005 Document Revised: 02/07/2012 Document Reviewed: 11/30/2011 [facility] Patient Information 2013 [place], [place].

## 2013-02-11 ENCOUNTER — Emergency Department (HOSPITAL_COMMUNITY): Payer: Medicare Other

## 2013-02-11 ENCOUNTER — Encounter (HOSPITAL_COMMUNITY): Payer: Self-pay

## 2013-02-11 ENCOUNTER — Emergency Department (HOSPITAL_COMMUNITY)
Admission: EM | Admit: 2013-02-11 | Discharge: 2013-02-11 | Disposition: A | Discharge status: Home / Self Care | Payer: Medicare Other | Attending: Emergency Medicine | Admitting: Emergency Medicine

## 2013-02-11 DIAGNOSIS — W1809XA Striking against other object with subsequent fall, initial encounter: Secondary | ICD-10-CM | POA: Insufficient documentation

## 2013-02-11 DIAGNOSIS — S0101XA Laceration without foreign body of scalp, initial encounter: Secondary | ICD-10-CM

## 2013-02-11 DIAGNOSIS — Z8659 Personal history of other mental and behavioral disorders: Secondary | ICD-10-CM | POA: Insufficient documentation

## 2013-02-11 DIAGNOSIS — S199XXA Unspecified injury of neck, initial encounter: Secondary | ICD-10-CM | POA: Diagnosis not present

## 2013-02-11 DIAGNOSIS — M545 Low back pain, unspecified: Secondary | ICD-10-CM | POA: Insufficient documentation

## 2013-02-11 DIAGNOSIS — E7800 Pure hypercholesterolemia, unspecified: Secondary | ICD-10-CM | POA: Insufficient documentation

## 2013-02-11 DIAGNOSIS — S0083XA Contusion of other part of head, initial encounter: Secondary | ICD-10-CM | POA: Diagnosis not present

## 2013-02-11 DIAGNOSIS — S0990XA Unspecified injury of head, initial encounter: Secondary | ICD-10-CM

## 2013-02-11 DIAGNOSIS — G8929 Other chronic pain: Secondary | ICD-10-CM | POA: Insufficient documentation

## 2013-02-11 DIAGNOSIS — I10 Essential (primary) hypertension: Secondary | ICD-10-CM | POA: Insufficient documentation

## 2013-02-11 DIAGNOSIS — Y939 Activity, unspecified: Secondary | ICD-10-CM | POA: Insufficient documentation

## 2013-02-11 DIAGNOSIS — Z951 Presence of aortocoronary bypass graft: Secondary | ICD-10-CM | POA: Insufficient documentation

## 2013-02-11 DIAGNOSIS — S0100XA Unspecified open wound of scalp, initial encounter: Secondary | ICD-10-CM | POA: Diagnosis not present

## 2013-02-11 DIAGNOSIS — F411 Generalized anxiety disorder: Secondary | ICD-10-CM | POA: Insufficient documentation

## 2013-02-11 DIAGNOSIS — Z8739 Personal history of other diseases of the musculoskeletal system and connective tissue: Secondary | ICD-10-CM | POA: Insufficient documentation

## 2013-02-11 DIAGNOSIS — Z79899 Other long term (current) drug therapy: Secondary | ICD-10-CM | POA: Insufficient documentation

## 2013-02-11 DIAGNOSIS — S298XXA Other specified injuries of thorax, initial encounter: Secondary | ICD-10-CM | POA: Diagnosis not present

## 2013-02-11 DIAGNOSIS — Z87891 Personal history of nicotine dependence: Secondary | ICD-10-CM | POA: Insufficient documentation

## 2013-02-11 DIAGNOSIS — F1010 Alcohol abuse, uncomplicated: Secondary | ICD-10-CM | POA: Insufficient documentation

## 2013-02-11 DIAGNOSIS — S1093XA Contusion of unspecified part of neck, initial encounter: Secondary | ICD-10-CM | POA: Diagnosis not present

## 2013-02-11 DIAGNOSIS — Z23 Encounter for immunization: Secondary | ICD-10-CM | POA: Insufficient documentation

## 2013-02-11 DIAGNOSIS — Z7982 Long term (current) use of aspirin: Secondary | ICD-10-CM | POA: Insufficient documentation

## 2013-02-11 DIAGNOSIS — F10929 Alcohol use, unspecified with intoxication, unspecified: Secondary | ICD-10-CM

## 2013-02-11 DIAGNOSIS — F410 Panic disorder [episodic paroxysmal anxiety] without agoraphobia: Secondary | ICD-10-CM | POA: Insufficient documentation

## 2013-02-11 DIAGNOSIS — S0003XA Contusion of scalp, initial encounter: Secondary | ICD-10-CM

## 2013-02-11 DIAGNOSIS — Y929 Unspecified place or not applicable: Secondary | ICD-10-CM | POA: Insufficient documentation

## 2013-02-11 DIAGNOSIS — I2510 Atherosclerotic heart disease of native coronary artery without angina pectoris: Secondary | ICD-10-CM | POA: Insufficient documentation

## 2013-02-11 MED ORDER — THIAMINE HCL 100 MG/ML IJ SOLN
100.0000 mg | Freq: Once | INTRAMUSCULAR | Status: AC
  Administered 2013-02-11: 100 mg via INTRAVENOUS
  Filled 2013-02-11: qty 2

## 2013-02-11 MED ORDER — SODIUM CHLORIDE 0.9 % IV BOLUS (SEPSIS)
1000.0000 mL | Freq: Once | INTRAVENOUS | Status: AC
  Administered 2013-02-11: 1000 mL via INTRAVENOUS

## 2013-02-11 MED ORDER — TETANUS-DIPHTH-ACELL PERTUSSIS 5-2.5-18.5 LF-MCG/0.5 IM SUSP
0.5000 mL | Freq: Once | INTRAMUSCULAR | Status: AC
  Administered 2013-02-11: 0.5 mL via INTRAMUSCULAR
  Filled 2013-02-11: qty 0.5

## 2013-02-11 NOTE — ED Notes (Signed)
Family at bedside. 

## 2013-02-11 NOTE — ED Notes (Signed)
Pt returned from CT, pt is A/O x 4, answers all questions appropriately but states he doesn't know why he is here. Spouse at bedside, pt assisted to urinal for voiding and clean with complete linen changed due to soiled clothes and linen while in testing. Pt  INAD, resp e/u, MAE x,4, pt tolerated procedure well. Pt HOB elevated for comfort measures and will continue to monitor pt.

## 2013-02-11 NOTE — ED Provider Notes (Signed)
I supervised this laceration repair.   Brandt Loosen, MD 02/11/13 [phone]
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
## 2013-02-11 NOTE — ED Provider Notes (Signed)
[name] S 6:15 AM patient discussed with attending physician. Will assist with posterior scalp injury with repair.   LACERATION REPAIR Performed by: Angus Seller Authorized by: Angus Seller Consent: Verbal consent obtained. Risks and benefits: risks, benefits and alternatives were discussed Consent given by: patient Patient identity confirmed: provided demographic data Prepped and Draped in normal sterile fashion Wound explored  Laceration Location: Posterior scout  Laceration Length: 3 cm  No Foreign Bodies seen or palpated  Anesthesia: None   Irrigation method: syringe Amount of cleaning: standard  Skin closure: Skin with staple   Number of sutures: 3   Patient tolerance: Patient tolerated the procedure well with no immediate complications.   Angus Seller, PA-C 02/11/13 [phone]

## 2013-02-11 NOTE — ED Provider Notes (Signed)
History     CSN: [id number]  Arrival date & time 02/11/13  0229   First MD Initiated Contact with Patient 02/11/13 0249      Chief Complaint  Patient presents with  . Fall  . Alcohol Intoxication    (Consider location/radiation/quality/duration/timing/severity/associated sxs/prior treatment) HPI Please note that this is a late entry. This patient was seen and examined by me shortly after his presentation to the emergency department. The patient is a 67 year old man who is brought to the emergency department by EMS after a fall. I have obtained history from the patient's family. The patient was very intoxicated after drinking throughout the evening. He was trying to open a car door when he lost his balance, fell backward and hit his head on asphalt surface. He did not lose consciousness.  Family does not believe that he sustained injury to any other region. The patient's level of consciousness the emergency department is similar to his level of consciousness prior to that of the head trauma,. He appears very intoxicated. Last tetanus is unknown. The patient is not taking any anticoagulants.  Past Medical History  Diagnosis Date  . Hypertension   . Coronary artery disease   . High cholesterol   . Anxiety   . Panic attacks   . Anginal pain 01/2005  . History of bronchitis     "used to get it alot" (08/25/2012)  . Arthritis     "knees"  . Chronic lower back pain   . PTSD (post-traumatic stress disorder)     "have been treated in the past" (08/25/2012)  . Depression     Past Surgical History  Procedure Laterality Date  . Coronary artery bypass graft  01/2005    CABG X3  . Cardiac catheterization  01/2005  . Varicose vein surgery  1980's    LLE  . Shrapnel  1969    LLE; left lateral thumb (required grafting)    Family History  Problem Relation Age of Onset  . Diabetes Mother   . Heart disease Father   . Cancer Brother     History  Substance Use Topics  . Smoking  status: Former Smoker -- 2.00 packs/day for 5 years  . Smokeless tobacco: Never Used     Comment: 08/25/2012 "quit smoking cigarettes 40 yr ago"  . Alcohol Use: Yes      Review of Systems Unable to obtain review of systems from this patient to degree of intoxication. Allergies  Codeine and Vicodin  Home Medications   Current Outpatient Rx  Name  Route  Sig  Dispense  Refill  . aspirin EC 325 MG tablet   Oral   Take 325 mg by mouth every morning.         . diazepam (VALIUM) 5 MG tablet   Oral   Take 1 tablet (5 mg total) by mouth at bedtime as needed for sleep.   15 tablet   0   . metoprolol succinate (TOPROL-XL) 25 MG 24 hr tablet   Oral   Take 25 mg by mouth daily.           . nitroGLYCERIN (NITROSTAT) 0.4 MG SL tablet   Sublingual   Place 0.4 mg under the tongue every 5 (five) minutes x 3 doses as needed. For chest pain         . sildenafil (VIAGRA) 100 MG tablet   Oral   Take 100 mg by mouth daily as needed for erectile dysfunction.  BP 130/79  Pulse 82  Temp(Src) 97.5 F (36.4 C) (Oral)  Resp 17  SpO2 94%  Physical Exam Gen: Laying on gurney supine, appears to be sleeping soundly, does not respond to my verbal stimulus. head: 3cm linear lac over the left parietal scalp eyes: PERLA, EOMI mouth: no signs of trauma, oral mucosa is well hydrated appearing Neck: soft, nontender, no c spine ttp Resp: lungs CTA B CV: RRR, no murmur, palp pulses in all extremities, skin appears well perfused Back: no steps offs, no spinal ttp Pelvis: nontender, stable MSK: no ttp, FROM without pain at both shoulder, elbows, wrists, fingers, hips, knees, ankles.   Skin: no lacs, abrasions, Neuro: patient with slurred speech, opens eyes in response to pain, withdraws all 4 ext in response to pain     ED Course  Procedures (including critical care time)  Labs Reviewed - No data to display Ct Head Wo Contrast  02/11/2013  *RADIOLOGY REPORT*  Clinical Data:   The patient fell backwards from standing position and struck the back of head on concrete.  Small hematoma to the posterior head.  Uncooperative patient.  CT HEAD WITHOUT CONTRAST CT CERVICAL SPINE WITHOUT CONTRAST  Technique:  Multidetector CT imaging of the head and cervical spine was performed following the standard protocol without intravenous contrast.  Multiplanar CT image reconstructions of the cervical spine were also generated.  Comparison:  Cervical spine myelogram 01/02/2009.  CT head and cervical spine 11/16/2004.  CT HEAD  Findings: Technically limited study due to motion artifact.  There is a subcutaneous scalp hematoma over the posterior vertex extending slightly towards the left of midline.  No underlying skull fractures.  The ventricles and sulci appear symmetrical.  No mass effect or midline shift.  No abnormal extra-axial fluid collections.  The gray-white matter junctions are distinct.  The basal cisterns are not effaced.  No ventricular dilatation.  No evidence of acute intracranial hemorrhage.  Visualized paranasal sinuses and mastoid air cells are not opacified.  Vascular calcifications.  IMPRESSION: Subcutaneous scalp hematoma over the posterior vertex towards the left.  No acute intracranial abnormalities.  CT CERVICAL SPINE  Findings: Studies and limited by motion artifact.  There appears to be normal alignment of the cervical vertebrae and facet joints. Degenerative changes in the facet joints.  Degenerative narrowing of intervertebral disc spaces with endplate hypertrophic changes from C3-4, C4-5, C5-6, and C6-7 levels.  Ununited osteophyte at the superior endplate of C5 is stable.  No prevertebral soft tissue swelling.  No vertebral compression deformities.  No focal bone lesion or bone destruction.  Bone cortex and trabecular architecture appear intact.  IMPRESSION: Degenerative changes and motion artifact in the cervical spine.  No displaced fractures identified.   Original Report  Authenticated By: Burman Nieves, M.D.    Ct Cervical Spine Wo Contrast  02/11/2013  *RADIOLOGY REPORT*  Clinical Data:  The patient fell backwards from standing position and struck the back of head on concrete.  Small hematoma to the posterior head.  Uncooperative patient.  CT HEAD WITHOUT CONTRAST CT CERVICAL SPINE WITHOUT CONTRAST  Technique:  Multidetector CT imaging of the head and cervical spine was performed following the standard protocol without intravenous contrast.  Multiplanar CT image reconstructions of the cervical spine were also generated.  Comparison:  Cervical spine myelogram 01/02/2009.  CT head and cervical spine 11/16/2004.  CT HEAD  Findings: Technically limited study due to motion artifact.  There is a subcutaneous scalp hematoma over the posterior vertex extending slightly  towards the left of midline.  No underlying skull fractures.  The ventricles and sulci appear symmetrical.  No mass effect or midline shift.  No abnormal extra-axial fluid collections.  The gray-white matter junctions are distinct.  The basal cisterns are not effaced.  No ventricular dilatation.  No evidence of acute intracranial hemorrhage.  Visualized paranasal sinuses and mastoid air cells are not opacified.  Vascular calcifications.  IMPRESSION: Subcutaneous scalp hematoma over the posterior vertex towards the left.  No acute intracranial abnormalities.  CT CERVICAL SPINE  Findings: Studies and limited by motion artifact.  There appears to be normal alignment of the cervical vertebrae and facet joints. Degenerative changes in the facet joints.  Degenerative narrowing of intervertebral disc spaces with endplate hypertrophic changes from C3-4, C4-5, C5-6, and C6-7 levels.  Ununited osteophyte at the superior endplate of C5 is stable.  No prevertebral soft tissue swelling.  No vertebral compression deformities.  No focal bone lesion or bone destruction.  Bone cortex and trabecular architecture appear intact.   IMPRESSION: Degenerative changes and motion artifact in the cervical spine.  No displaced fractures identified.   Original Report Authenticated By: Burman Nieves, M.D.    Dg Chest Port 1 View  02/11/2013  *RADIOLOGY REPORT*  Clinical Data: The patient fell at home and struck head.  The patient is unresponsive to questions.  PORTABLE CHEST - 1 VIEW  Comparison: 08/25/2012  Findings: Stable appearance of postoperative changes in the mediastinum.  Shallow inspiration.  Mild cardiac enlargement with normal pulmonary vascularity, likely normal for technique.  No focal airspace consolidation in the lungs.  No blunting of costophrenic angles.  No pneumothorax.  Mediastinal contours appear intact.  No significant change since previous study.  IMPRESSION: No evidence of active pulmonary disease.  Shallow inspiration.   Original Report Authenticated By: Burman Nieves, M.D.      We have ruled out ICH and skull fx, no signs of c spine fx. Laceration to the scalp repaired. Patient treated with thiamine and IVF for alcohol intoxication. The patient will need to be observed until clinically sober but will then be ok for discharge   MDM  1. Head injury/concussion without LOC 2. Laceration scalp 3. Alcohol intoxication.         Brandt Loosen, MD 02/11/13 [phone]

## 2013-02-13 ENCOUNTER — Emergency Department (INDEPENDENT_AMBULATORY_CARE_PROVIDER_SITE_OTHER)
Admission: EM | Admit: 2013-02-13 | Discharge: 2013-02-13 | Disposition: A | Discharge status: Home / Self Care | Payer: Medicare Other | Source: Home / Self Care | Attending: Family Medicine | Admitting: Family Medicine

## 2013-02-13 ENCOUNTER — Encounter (HOSPITAL_COMMUNITY): Payer: Self-pay | Admitting: Adult Health

## 2013-02-13 ENCOUNTER — Encounter (HOSPITAL_COMMUNITY): Payer: Self-pay | Admitting: *Deleted

## 2013-02-13 ENCOUNTER — Emergency Department (HOSPITAL_COMMUNITY)
Admission: EM | Admit: 2013-02-13 | Discharge: 2013-02-14 | Disposition: A | Discharge status: Home / Self Care | Payer: Medicare Other | Attending: Emergency Medicine | Admitting: Emergency Medicine

## 2013-02-13 DIAGNOSIS — Z862 Personal history of diseases of the blood and blood-forming organs and certain disorders involving the immune mechanism: Secondary | ICD-10-CM | POA: Diagnosis not present

## 2013-02-13 DIAGNOSIS — Z7982 Long term (current) use of aspirin: Secondary | ICD-10-CM | POA: Diagnosis not present

## 2013-02-13 DIAGNOSIS — R0602 Shortness of breath: Secondary | ICD-10-CM | POA: Insufficient documentation

## 2013-02-13 DIAGNOSIS — R42 Dizziness and giddiness: Secondary | ICD-10-CM | POA: Diagnosis not present

## 2013-02-13 DIAGNOSIS — Z8659 Personal history of other mental and behavioral disorders: Secondary | ICD-10-CM | POA: Diagnosis not present

## 2013-02-13 DIAGNOSIS — Z79899 Other long term (current) drug therapy: Secondary | ICD-10-CM | POA: Diagnosis not present

## 2013-02-13 DIAGNOSIS — M545 Low back pain, unspecified: Secondary | ICD-10-CM | POA: Diagnosis not present

## 2013-02-13 DIAGNOSIS — F0781 Postconcussional syndrome: Secondary | ICD-10-CM | POA: Diagnosis not present

## 2013-02-13 DIAGNOSIS — I10 Essential (primary) hypertension: Secondary | ICD-10-CM

## 2013-02-13 DIAGNOSIS — Z8739 Personal history of other diseases of the musculoskeletal system and connective tissue: Secondary | ICD-10-CM | POA: Diagnosis not present

## 2013-02-13 DIAGNOSIS — F410 Panic disorder [episodic paroxysmal anxiety] without agoraphobia: Secondary | ICD-10-CM | POA: Diagnosis not present

## 2013-02-13 DIAGNOSIS — Z8709 Personal history of other diseases of the respiratory system: Secondary | ICD-10-CM | POA: Diagnosis not present

## 2013-02-13 DIAGNOSIS — G44309 Post-traumatic headache, unspecified, not intractable: Secondary | ICD-10-CM | POA: Diagnosis not present

## 2013-02-13 DIAGNOSIS — Z87891 Personal history of nicotine dependence: Secondary | ICD-10-CM | POA: Insufficient documentation

## 2013-02-13 DIAGNOSIS — Z8639 Personal history of other endocrine, nutritional and metabolic disease: Secondary | ICD-10-CM | POA: Insufficient documentation

## 2013-02-13 DIAGNOSIS — Z8679 Personal history of other diseases of the circulatory system: Secondary | ICD-10-CM | POA: Insufficient documentation

## 2013-02-13 DIAGNOSIS — F411 Generalized anxiety disorder: Secondary | ICD-10-CM | POA: Insufficient documentation

## 2013-02-13 DIAGNOSIS — G8929 Other chronic pain: Secondary | ICD-10-CM | POA: Insufficient documentation

## 2013-02-13 DIAGNOSIS — I2510 Atherosclerotic heart disease of native coronary artery without angina pectoris: Secondary | ICD-10-CM | POA: Diagnosis not present

## 2013-02-13 DIAGNOSIS — S1093XA Contusion of unspecified part of neck, initial encounter: Secondary | ICD-10-CM | POA: Diagnosis not present

## 2013-02-13 DIAGNOSIS — Z951 Presence of aortocoronary bypass graft: Secondary | ICD-10-CM | POA: Diagnosis not present

## 2013-02-13 LAB — BASIC METABOLIC PANEL
GFR calc Af Amer: >90 mL/min (ref >90)
GFR calc non Af Amer: 85 mL/min — ABNORMAL LOW (ref >90)
Potassium: 3.5 mEq/L (ref 3.5–5.1)
Sodium: 136 mEq/L (ref 135–145)

## 2013-02-13 LAB — CBC
MCHC: 35.7 g/dL (ref 30.0–36.0)
Platelets: 123 10*3/uL — ABNORMAL LOW (ref 150–400)
RDW: 12.3 % (ref 11.5–15.5)

## 2013-02-13 LAB — POCT I-STAT TROPONIN I: Troponin i, poc: 0 ng/mL (ref 0.00–0.08)

## 2013-02-13 MED ORDER — FUROSEMIDE 40 MG PO TABS
40.0000 mg | ORAL_TABLET | Freq: Every day | ORAL | Status: DC
  Administered 2013-02-13: 40 mg via ORAL

## 2013-02-13 MED ORDER — FUROSEMIDE 40 MG PO TABS
ORAL_TABLET | ORAL | Status: AC
  Filled 2013-02-13: qty 1

## 2013-02-13 MED ORDER — ACETAMINOPHEN 325 MG PO TABS
ORAL_TABLET | ORAL | Status: AC
  Filled 2013-02-13: qty 1

## 2013-02-13 NOTE — ED Notes (Signed)
Pt  Has          A  Headache  He   Was   Seen         sev  Days  Ago  Er  For intoxication     And    Head   Injury          He  Has  Staples  In place         He  Did  Not  Take  His     bp  meds  Today  His  pearla

## 2013-02-13 NOTE — ED Notes (Addendum)
Presents post head injury from Saturday, since he has not taken HTN medication. Presents with head pain, trouble concentrating, SOB, dizziness and high blood pressure and intermittent blurred vision. Pt is flushed, alert, oriented, BP 188/94. Denies Chest pain. SOB is worse with exertion. Pt took, 25mg  of metoprolol at 2145 this evening with no relief.  Reports headache 10/10 described as throbbing with no relief from Tylenol.

## 2013-02-13 NOTE — ED Provider Notes (Addendum)
History     CSN: [id number]  Arrival date & time 02/13/13  2258   First MD Initiated Contact with Patient 02/13/13 2333      Chief Complaint  Patient presents with  . Hypertension    (Consider location/radiation/quality/duration/timing/severity/associated sxs/prior treatment) HPI This is a 67 year old male who fell and struck his head 2 days ago. There was a brief loss of consciousness. He was evaluated in the ED and had a negative CT scan. He was fearful about taking his metoprolol would adversely affect his head injury so he had not taken his metoprolol since the injury. Today he felt lightheaded with a severe headache and mild shortness of breath on exertion. He checked his blood pressure and it was found to be 185/102. He was seen in our urgent care and given Lasix but no metoprolol. He finally took a dose of his metoprolol about 10 PM today. He continues to have the lightheadedness and headache despite the Lasix and metoprolol. His blood pressure has improved and was noted to be 172/90 during my examination. He denies chest pain. He has had some trouble concentrating and trouble sleeping. This is partly due to his being out of Valium.  Past Medical History  Diagnosis Date  . Hypertension   . Coronary artery disease   . High cholesterol   . Anxiety   . Panic attacks   . Anginal pain 01/2005  . History of bronchitis     "used to get it alot" (08/25/2012)  . Arthritis     "knees"  . Chronic lower back pain   . PTSD (post-traumatic stress disorder)     "have been treated in the past" (08/25/2012)  . Depression     Past Surgical History  Procedure Laterality Date  . Coronary artery bypass graft  01/2005    CABG X3  . Cardiac catheterization  01/2005  . Varicose vein surgery  1980's    LLE  . Shrapnel  1969    LLE; left lateral thumb (required grafting)    Family History  Problem Relation Age of Onset  . Diabetes Mother   . Heart disease Father   . Cancer Brother      History  Substance Use Topics  . Smoking status: Former Smoker -- 2.00 packs/day for 5 years  . Smokeless tobacco: Never Used     Comment: 08/25/2012 "quit smoking cigarettes 40 yr ago"  . Alcohol Use: Yes      Review of Systems  All other systems reviewed and are negative.    Allergies  Codeine and Vicodin  Home Medications   Current Outpatient Rx  Name  Route  Sig  Dispense  Refill  . aspirin EC 325 MG tablet   Oral   Take 325 mg by mouth every morning.         . diazepam (VALIUM) 5 MG tablet   Oral   Take 1 tablet (5 mg total) by mouth at bedtime as needed for sleep.   15 tablet   0   . metoprolol succinate (TOPROL-XL) 25 MG 24 hr tablet   Oral   Take 25 mg by mouth daily.           . nitroGLYCERIN (NITROSTAT) 0.4 MG SL tablet   Sublingual   Place 0.4 mg under the tongue every 5 (five) minutes x 3 doses as needed. For chest pain         . sildenafil (VIAGRA) 100 MG tablet   Oral   Take  100 mg by mouth daily as needed for erectile dysfunction.           BP 188/94  Pulse 75  Temp(Src) 97.8 F (36.6 C) (Oral)  Resp 16  SpO2 98%  Physical Exam General: Well-developed, well-nourished male in no acute distress; appearance consistent with age of record HENT: normocephalic, atraumatic Eyes: pupils equal round and reactive to light; extraocular muscles intact Neck: supple Heart: regular rate and rhythm Lungs: clear to auscultation bilaterally Abdomen: soft; nondistended Extremities: No deformity; full range of motion; trace edema of lower legs Neurologic: Awake, alert and oriented; motor function intact in all extremities and symmetric; no facial droop Skin: Warm and dry Psychiatric: Anxious    ED Course  Procedures (including critical care time)    MDM   Nursing notes and vitals signs, including pulse oximetry, reviewed.  Summary of this visit's results, reviewed by myself:  Labs:  Results for orders placed during the hospital  encounter of 02/13/13 (from the past 24 hour(s))  CBC     Status: Abnormal   Collection Time    02/13/13 11:24 PM      Result Value Range   WBC 5.8  4.0 - 10.5 K/uL   RBC 5.02  4.22 - 5.81 MIL/uL   Hemoglobin 15.8  13.0 - 17.0 g/dL   HCT 41.3  24.4 - 01.0 %   MCV 88.0  78.0 - 100.0 fL   MCH 31.5  26.0 - 34.0 pg   MCHC 35.7  30.0 - 36.0 g/dL   RDW 27.2  53.6 - 64.4 %   Platelets 123 (*) 150 - 400 K/uL  BASIC METABOLIC PANEL     Status: Abnormal   Collection Time    02/13/13 11:24 PM      Result Value Range   Sodium 136  135 - 145 mEq/L   Potassium 3.5  3.5 - 5.1 mEq/L   Chloride 98  96 - 112 mEq/L   CO2 24  19 - 32 mEq/L   Glucose, Bld 111 (*) 70 - 99 mg/dL   BUN 14  6 - 23 mg/dL   Creatinine, Ser 0.34  0.50 - 1.35 mg/dL   Calcium 9.8  8.4 - 74.2 mg/dL   GFR calc non Af Amer 85 (*) >90 mL/min   GFR calc Af Amer >90  >90 mL/min  POCT I-STAT TROPONIN I     Status: None   Collection Time    02/13/13 11:31 PM      Result Value Range   Troponin i, poc 0.00  0.00 - 0.08 ng/mL   Comment 3             Imaging Studies: Ct Head Wo Contrast  02/14/2013  *RADIOLOGY REPORT*  Clinical Data: Head injury on Saturday.  Worsening pain. Difficulty concentrating.  Shortness of breath, dizziness, and high blood pressure.  Intermittent blurred vision.  CT HEAD WITHOUT CONTRAST  Technique:  Contiguous axial images were obtained from the base of the skull through the vertex without contrast.  Comparison: 02/11/2013  Findings: Subcutaneous scalp hematoma again demonstrated in the left posterior vertex with skin clips in place.  No underlying skull fractures.  Mild cerebral atrophy.  Mild ventricular dilatation consistent with central atrophy.  No mass effect or midline shift.  No abnormal extra-axial fluid collections.  Wallace Cullens- white matter junctions are distinct.  Basal cisterns are not effaced.  No evidence of acute intracranial hemorrhage.  No significant change since previous study.  Vascular  calcifications. Visualized  paranasal sinuses are not opacified.  IMPRESSION: No acute intracranial abnormalities.  No significant interval change.  Left posterior subcutaneous scalp hematoma again identified.   Original Report Authenticated By: Burman Nieves, M.D.        Date: 02/13/2013 11:19 PM  Rate: 73  Rhythm: normal sinus rhythm  QRS Axis: normal  Intervals: normal  ST/T Wave abnormalities: normal  Conduction Disutrbances: none  Narrative Interpretation: unremarkable  Comparison with previous EKG: no significant changes  1:30 AM Hypertension continues to improve NED. Suspect symptoms are due largely to postconcussive syndrome. He does request that we refill his Valium. Patient was advised to take his metoprolol as prescribed. He was advised to discontinuing a beta blocker abruptly can cause significant rebound hypertension.         Hanley Seamen, MD 02/14/13 0130  Hanley Seamen, MD 02/14/13 1610

## 2013-02-13 NOTE — ED Provider Notes (Signed)
History     CSN: [id number]  Arrival date & time 02/13/13  1439   First MD Initiated Contact with Patient 02/13/13 1447      Chief Complaint  Patient presents with  . Headache    (Consider location/radiation/quality/duration/timing/severity/associated sxs/prior treatment) Patient is a 67 y.o. male presenting with headaches. The history is provided by the patient.  Headache Pain location:  Generalized Quality:  Dull Radiates to:  Does not radiate Onset quality:  Gradual Duration: seen 3/16 after fall with scalp lac from intoxication, has not taken bp meds, no n/v, dizziness ,gait or speech problems. had neg ct scan.. Timing:  Constant Progression:  Unchanged Associated symptoms: no dizziness     Past Medical History  Diagnosis Date  . Hypertension   . Coronary artery disease   . High cholesterol   . Anxiety   . Panic attacks   . Anginal pain 01/2005  . History of bronchitis     "used to get it alot" (08/25/2012)  . Arthritis     "knees"  . Chronic lower back pain   . PTSD (post-traumatic stress disorder)     "have been treated in the past" (08/25/2012)  . Depression     Past Surgical History  Procedure Laterality Date  . Coronary artery bypass graft  01/2005    CABG X3  . Cardiac catheterization  01/2005  . Varicose vein surgery  1980's    LLE  . Shrapnel  1969    LLE; left lateral thumb (required grafting)    Family History  Problem Relation Age of Onset  . Diabetes Mother   . Heart disease Father   . Cancer Brother     History  Substance Use Topics  . Smoking status: Former Smoker -- 2.00 packs/day for 5 years  . Smokeless tobacco: Never Used     Comment: 08/25/2012 "quit smoking cigarettes 40 yr ago"  . Alcohol Use: Yes      Review of Systems  Constitutional: Negative.   Respiratory: Negative.   Cardiovascular: Negative.   Gastrointestinal: Negative.   Neurological: Positive for headaches. Negative for dizziness, syncope, speech difficulty,  weakness and light-headedness.    Allergies  Codeine and Vicodin  Home Medications   Current Outpatient Rx  Name  Route  Sig  Dispense  Refill  . acetaminophen (TYLENOL) 325 MG tablet   Oral   Take 650 mg by mouth every 6 (six) hours as needed for pain.         Marland Kitchen aspirin EC 325 MG tablet   Oral   Take 325 mg by mouth every morning.         . diazepam (VALIUM) 5 MG tablet   Oral   Take 1 tablet (5 mg total) by mouth at bedtime as needed for sleep.   15 tablet   0   . diazepam (VALIUM) 5 MG tablet   Oral   Take 1 tablet (5 mg total) by mouth at bedtime as needed for anxiety or sleep (spasms).   20 tablet   0   . ibuprofen (ADVIL,MOTRIN) 600 MG tablet   Oral   Take 600 mg by mouth every 6 (six) hours as needed for pain.         . metoprolol succinate (TOPROL-XL) 25 MG 24 hr tablet   Oral   Take 25 mg by mouth daily.           . nitroGLYCERIN (NITROSTAT) 0.4 MG SL tablet   Sublingual  Place 0.4 mg under the tongue every 5 (five) minutes x 3 doses as needed. For chest pain         . oxyCODONE-acetaminophen (PERCOCET) 5-325 MG per tablet   Oral   Take 1-2 tablets by mouth every 4 (four) hours as needed for pain.   20 tablet   0   . sildenafil (VIAGRA) 100 MG tablet   Oral   Take 100 mg by mouth daily as needed for erectile dysfunction.           BP 186/96  Pulse 72  Temp(Src) 97.7 F (36.5 C) (Oral)  Resp 20  SpO2 98%  Physical Exam  Nursing note and vitals reviewed. Constitutional: He is oriented to person, place, and time. He appears well-developed and well-nourished.  HENT:  Head: Normocephalic.  Right Ear: External ear normal.  Left Ear: External ear normal.  occip scalp lac healing well, no sts or crepitation.  Eyes: Conjunctivae are normal. Pupils are equal, round, and reactive to light.  Neck: Normal range of motion. Neck supple.  Cardiovascular: Regular rhythm, normal heart sounds and intact distal pulses.   Pulmonary/Chest:  Effort normal and breath sounds normal.  Neurological: He is alert and oriented to person, place, and time.  Skin: Skin is warm and dry.    ED Course  Procedures (including critical care time)  Labs Reviewed - No data to display Ct Head Wo Contrast  02/14/2013  *RADIOLOGY REPORT*  Clinical Data: Head injury on Saturday.  Worsening pain. Difficulty concentrating.  Shortness of breath, dizziness, and high blood pressure.  Intermittent blurred vision.  CT HEAD WITHOUT CONTRAST  Technique:  Contiguous axial images were obtained from the base of the skull through the vertex without contrast.  Comparison: 02/11/2013  Findings: Subcutaneous scalp hematoma again demonstrated in the left posterior vertex with skin clips in place.  No underlying skull fractures.  Mild cerebral atrophy.  Mild ventricular dilatation consistent with central atrophy.  No mass effect or midline shift.  No abnormal extra-axial fluid collections.  Wallace Cullens- white matter junctions are distinct.  Basal cisterns are not effaced.  No evidence of acute intracranial hemorrhage.  No significant change since previous study.  Vascular calcifications. Visualized paranasal sinuses are not opacified.  IMPRESSION: No acute intracranial abnormalities.  No significant interval change.  Left posterior subcutaneous scalp hematoma again identified.   Original Report Authenticated By: Burman Nieves, M.D.      1. Hypertension, benign essential, goal below 140/90   2. Headache due to trauma       MDM  Sx improved at d/c after meds given.       Linna Hoff, MD 02/14/13 [phone]

## 2013-02-14 ENCOUNTER — Emergency Department (HOSPITAL_COMMUNITY): Payer: Medicare Other

## 2013-02-14 ENCOUNTER — Encounter (HOSPITAL_COMMUNITY): Payer: Self-pay | Admitting: Radiology

## 2013-02-14 DIAGNOSIS — S0083XA Contusion of other part of head, initial encounter: Secondary | ICD-10-CM | POA: Diagnosis not present

## 2013-02-14 MED ORDER — OXYCODONE-ACETAMINOPHEN 5-325 MG PO TABS
1.0000 | ORAL_TABLET | Freq: Once | ORAL | Status: AC
  Administered 2013-02-14: 1 via ORAL
  Filled 2013-02-14: qty 1

## 2013-02-14 MED ORDER — OXYCODONE-ACETAMINOPHEN 5-325 MG PO TABS: 1.0000 | ORAL_TABLET | ORAL | Status: DC | PRN

## 2013-02-14 MED ORDER — DIAZEPAM 5 MG PO TABS: 5.0000 mg | ORAL_TABLET | Freq: Every evening | ORAL | Status: DC | PRN

## 2013-02-19 ENCOUNTER — Encounter (HOSPITAL_BASED_OUTPATIENT_CLINIC_OR_DEPARTMENT_OTHER): Payer: Self-pay

## 2013-02-19 ENCOUNTER — Emergency Department (HOSPITAL_BASED_OUTPATIENT_CLINIC_OR_DEPARTMENT_OTHER)
Admission: EM | Admit: 2013-02-19 | Discharge: 2013-02-19 | Disposition: A | Discharge status: Home / Self Care | Payer: Medicare Other | Attending: Emergency Medicine | Admitting: Emergency Medicine

## 2013-02-19 DIAGNOSIS — I10 Essential (primary) hypertension: Secondary | ICD-10-CM | POA: Diagnosis not present

## 2013-02-19 DIAGNOSIS — G8929 Other chronic pain: Secondary | ICD-10-CM | POA: Insufficient documentation

## 2013-02-19 DIAGNOSIS — Z862 Personal history of diseases of the blood and blood-forming organs and certain disorders involving the immune mechanism: Secondary | ICD-10-CM | POA: Insufficient documentation

## 2013-02-19 DIAGNOSIS — Z8659 Personal history of other mental and behavioral disorders: Secondary | ICD-10-CM | POA: Insufficient documentation

## 2013-02-19 DIAGNOSIS — F410 Panic disorder [episodic paroxysmal anxiety] without agoraphobia: Secondary | ICD-10-CM | POA: Diagnosis not present

## 2013-02-19 DIAGNOSIS — Z8709 Personal history of other diseases of the respiratory system: Secondary | ICD-10-CM | POA: Insufficient documentation

## 2013-02-19 DIAGNOSIS — Z87891 Personal history of nicotine dependence: Secondary | ICD-10-CM | POA: Diagnosis not present

## 2013-02-19 DIAGNOSIS — Z8679 Personal history of other diseases of the circulatory system: Secondary | ICD-10-CM | POA: Diagnosis not present

## 2013-02-19 DIAGNOSIS — Z4802 Encounter for removal of sutures: Secondary | ICD-10-CM | POA: Diagnosis not present

## 2013-02-19 DIAGNOSIS — F329 Major depressive disorder, single episode, unspecified: Secondary | ICD-10-CM | POA: Diagnosis not present

## 2013-02-19 DIAGNOSIS — Z7982 Long term (current) use of aspirin: Secondary | ICD-10-CM | POA: Diagnosis not present

## 2013-02-19 DIAGNOSIS — Z951 Presence of aortocoronary bypass graft: Secondary | ICD-10-CM | POA: Insufficient documentation

## 2013-02-19 DIAGNOSIS — Z8639 Personal history of other endocrine, nutritional and metabolic disease: Secondary | ICD-10-CM | POA: Insufficient documentation

## 2013-02-19 DIAGNOSIS — Z8739 Personal history of other diseases of the musculoskeletal system and connective tissue: Secondary | ICD-10-CM | POA: Diagnosis not present

## 2013-02-19 DIAGNOSIS — I2510 Atherosclerotic heart disease of native coronary artery without angina pectoris: Secondary | ICD-10-CM | POA: Insufficient documentation

## 2013-02-19 DIAGNOSIS — F3289 Other specified depressive episodes: Secondary | ICD-10-CM | POA: Insufficient documentation

## 2013-02-19 DIAGNOSIS — Z79899 Other long term (current) drug therapy: Secondary | ICD-10-CM | POA: Insufficient documentation

## 2013-02-19 NOTE — ED Notes (Signed)
Pt states that he had staples placed to posterior scalp on march 16.  Presents today for staple removal.  3 staples noted, healing well.  No drainage noted.

## 2013-02-19 NOTE — ED Provider Notes (Signed)
History     CSN: [id number]  Arrival date & time 02/19/13  4098   First MD Initiated Contact with Patient 02/19/13 1049      Chief Complaint  Patient presents with  . Suture / Staple Removal    (Consider location/radiation/quality/duration/timing/severity/associated sxs/prior treatment) HPI 67 year old male presents for staple removal. He had a laceration of his scalp with staples placed on March 16. He is complaining of some mild to moderate pain where the staples are but has no other complaints. Past Medical History  Diagnosis Date  . Hypertension   . Coronary artery disease   . High cholesterol   . Anxiety   . Panic attacks   . Anginal pain 01/2005  . History of bronchitis     "used to get it alot" (08/25/2012)  . Arthritis     "knees"  . Chronic lower back pain   . PTSD (post-traumatic stress disorder)     "have been treated in the past" (08/25/2012)  . Depression     Past Surgical History  Procedure Laterality Date  . Coronary artery bypass graft  01/2005    CABG X3  . Cardiac catheterization  01/2005  . Varicose vein surgery  1980's    LLE  . Shrapnel  1969    LLE; left lateral thumb (required grafting)    Family History  Problem Relation Age of Onset  . Diabetes Mother   . Heart disease Father   . Cancer Brother     History  Substance Use Topics  . Smoking status: Former Smoker -- 2.00 packs/day for 5 years  . Smokeless tobacco: Never Used     Comment: 08/25/2012 "quit smoking cigarettes 40 yr ago"  . Alcohol Use: Yes      Review of Systems  Allergies  Codeine and Vicodin  Home Medications   Current Outpatient Rx  Name  Route  Sig  Dispense  Refill  . aspirin EC 325 MG tablet   Oral   Take 325 mg by mouth every morning.         . diazepam (VALIUM) 5 MG tablet   Oral   Take 1 tablet (5 mg total) by mouth at bedtime as needed for sleep.   15 tablet   0   . diazepam (VALIUM) 5 MG tablet   Oral   Take 1 tablet (5 mg total) by mouth at  bedtime as needed for anxiety or sleep (spasms).   20 tablet   0   . metoprolol succinate (TOPROL-XL) 25 MG 24 hr tablet   Oral   Take 25 mg by mouth daily.           Marland Kitchen oxyCODONE-acetaminophen (PERCOCET) 5-325 MG per tablet   Oral   Take 1-2 tablets by mouth every 4 (four) hours as needed for pain.   20 tablet   0   . acetaminophen (TYLENOL) 325 MG tablet   Oral   Take 650 mg by mouth every 6 (six) hours as needed for pain.         Marland Kitchen ibuprofen (ADVIL,MOTRIN) 600 MG tablet   Oral   Take 600 mg by mouth every 6 (six) hours as needed for pain.         . nitroGLYCERIN (NITROSTAT) 0.4 MG SL tablet   Sublingual   Place 0.4 mg under the tongue every 5 (five) minutes x 3 doses as needed. For chest pain         . sildenafil (VIAGRA) 100 MG tablet  Oral   Take 100 mg by mouth daily as needed for erectile dysfunction.           BP 165/86  Pulse 62  Temp(Src) 97.5 F (36.4 C) (Oral)  Resp 16  Ht 6' (1.829 m)  Wt 248 lb (112.492 kg)  BMI 33.63 kg/m2  SpO2 100%  Physical Exam 67 year old male, resting comfortably and in no acute distress. Vital signs are significant for hypertension with blood pressure 165/86. Oxygen saturation is 100%, which is normal. Head is normocephalic. PERRLA, EOMI. Oropharynx is clear. 3 staples are present and a wound in the occiput. The wound has healed well without evidence of infection. Neck is nontender and supple without adenopathy or JVD. Back is nontender and there is no CVA tenderness. Lungs are clear without rales, wheezes, or rhonchi. Chest is nontender. Heart has regular rate and rhythm without murmur. Abdomen is soft, flat, nontender without masses or hepatosplenomegaly and peristalsis is normoactive. Extremities have no cyanosis or edema, full range of motion is present. Skin is warm and dry without rash. Neurologic: Mental status is normal, cranial nerves are intact, there are no motor or sensory deficits.  ED Course   Procedures (including critical care time) After identifying the patient verbally and with his hospital wrist bracelet, a timeout was performed. The 3 staples were removed from his occipital wound without difficulty. Patient tolerated procedure well.  1. Removal of staples       MDM  Staple removal. He is to resume normal activities.        Dione Booze, MD 02/19/13 1057

## 2013-02-22 ENCOUNTER — Encounter (HOSPITAL_COMMUNITY): Payer: Self-pay | Admitting: *Deleted

## 2013-02-22 ENCOUNTER — Emergency Department (HOSPITAL_COMMUNITY)
Admission: EM | Admit: 2013-02-22 | Discharge: 2013-02-22 | Disposition: A | Discharge status: Home / Self Care | Payer: Medicare Other | Attending: Emergency Medicine | Admitting: Emergency Medicine

## 2013-02-22 DIAGNOSIS — F411 Generalized anxiety disorder: Secondary | ICD-10-CM | POA: Insufficient documentation

## 2013-02-22 DIAGNOSIS — IMO0002 Reserved for concepts with insufficient information to code with codable children: Secondary | ICD-10-CM | POA: Diagnosis not present

## 2013-02-22 DIAGNOSIS — F4310 Post-traumatic stress disorder, unspecified: Secondary | ICD-10-CM | POA: Insufficient documentation

## 2013-02-22 DIAGNOSIS — Z79899 Other long term (current) drug therapy: Secondary | ICD-10-CM | POA: Diagnosis not present

## 2013-02-22 DIAGNOSIS — I2510 Atherosclerotic heart disease of native coronary artery without angina pectoris: Secondary | ICD-10-CM | POA: Diagnosis not present

## 2013-02-22 DIAGNOSIS — R42 Dizziness and giddiness: Secondary | ICD-10-CM | POA: Diagnosis not present

## 2013-02-22 DIAGNOSIS — Z87891 Personal history of nicotine dependence: Secondary | ICD-10-CM | POA: Diagnosis not present

## 2013-02-22 DIAGNOSIS — Z7982 Long term (current) use of aspirin: Secondary | ICD-10-CM | POA: Diagnosis not present

## 2013-02-22 DIAGNOSIS — M1710 Unilateral primary osteoarthritis, unspecified knee: Secondary | ICD-10-CM | POA: Insufficient documentation

## 2013-02-22 DIAGNOSIS — Z8709 Personal history of other diseases of the respiratory system: Secondary | ICD-10-CM | POA: Insufficient documentation

## 2013-02-22 DIAGNOSIS — I10 Essential (primary) hypertension: Secondary | ICD-10-CM | POA: Diagnosis not present

## 2013-02-22 DIAGNOSIS — E7800 Pure hypercholesterolemia, unspecified: Secondary | ICD-10-CM | POA: Diagnosis not present

## 2013-02-22 DIAGNOSIS — G8929 Other chronic pain: Secondary | ICD-10-CM | POA: Insufficient documentation

## 2013-02-22 DIAGNOSIS — Z8679 Personal history of other diseases of the circulatory system: Secondary | ICD-10-CM | POA: Insufficient documentation

## 2013-02-22 LAB — GLUCOSE, CAPILLARY: Glucose-Capillary: 121 mg/dL — ABNORMAL HIGH (ref 70–99)

## 2013-02-22 LAB — POCT I-STAT, CHEM 8
BUN: 16 mg/dL (ref 6–23)
Calcium, Ion: 1.19 mmol/L (ref 1.13–1.30)
HCT: 46 % (ref 39.0–52.0)
TCO2: 28 mmol/L (ref 0–100)

## 2013-02-22 LAB — POCT I-STAT TROPONIN I: Troponin i, poc: 0 ng/mL (ref 0.00–0.08)

## 2013-02-22 MED ORDER — LORAZEPAM 1 MG PO TABS
1.0000 mg | ORAL_TABLET | Freq: Once | ORAL | Status: DC
  Filled 2013-02-22: qty 1

## 2013-02-22 NOTE — ED Notes (Signed)
Pt states he checked his BP 20 min PTA and it was 211/100.  Pt does have some blurred vision and a HA.

## 2013-02-22 NOTE — ED Notes (Signed)
Holding ativan per Dr. Dierdre Highman due to pt taking valium prior to arrival.

## 2013-02-22 NOTE — ED Notes (Addendum)
Pt. Reports BP elevated tonight. Pt. took valium, metoprolol, 324 ASA, 1 nitro, and 1 shot of alcohol to try to make BP drop but states "It just kept raising back up". Pt. States BP is normally 140s/80s. States "I feel like my heart is racing too".

## 2013-02-22 NOTE — ED Provider Notes (Signed)
History     CSN: [id number]  Arrival date & time 02/22/13  0345   First MD Initiated Contact with Patient 02/22/13 [phone]      Chief Complaint  Patient presents with  . Hypertension    (Consider location/radiation/quality/duration/timing/severity/associated sxs/prior treatment) HPI Hx per Patient. Has HTN. Takes BP medications. Ate salmon for dinner concerned because it was heavily salted. Feeling palpitations and lightheaded this am and checked his BP, states he could tell it was elevated. BP read 211/100,  He last took Toprol XL at 9am as prescribed. No CP or SOB. No N/V. No diaphoresis. Had CP with CABG 2006.  PT admits to being anxious about his BP, took valium PTA. Lightheadedness resolved. No syncope.    Past Medical History  Diagnosis Date  . Hypertension   . Coronary artery disease   . High cholesterol   . Anxiety   . Panic attacks   . Anginal pain 01/2005  . History of bronchitis     "used to get it alot" (08/25/2012)  . Arthritis     "knees"  . Chronic lower back pain   . PTSD (post-traumatic stress disorder)     "have been treated in the past" (08/25/2012)  . Depression     Past Surgical History  Procedure Laterality Date  . Coronary artery bypass graft  01/2005    CABG X3  . Cardiac catheterization  01/2005  . Varicose vein surgery  1980's    LLE  . Shrapnel  1969    LLE; left lateral thumb (required grafting)    Family History  Problem Relation Age of Onset  . Diabetes Mother   . Heart disease Father   . Cancer Brother     History  Substance Use Topics  . Smoking status: Former Smoker -- 2.00 packs/day for 5 years  . Smokeless tobacco: Never Used     Comment: 08/25/2012 "quit smoking cigarettes 40 yr ago"  . Alcohol Use: Yes      Review of Systems  Constitutional: Negative for fever and chills.  HENT: Negative for neck pain and neck stiffness.   Eyes: Negative for visual disturbance.  Respiratory: Negative for shortness of breath.    Cardiovascular: Negative for chest pain.  Gastrointestinal: Negative for abdominal pain.  Genitourinary: Negative for dysuria.  Musculoskeletal: Negative for back pain.  Skin: Negative for rash.  Neurological: Positive for light-headedness. Negative for headaches.  All other systems reviewed and are negative.    Allergies  Codeine and Vicodin  Home Medications   Current Outpatient Rx  Name  Route  Sig  Dispense  Refill  . acetaminophen (TYLENOL) 325 MG tablet   Oral   Take 650 mg by mouth every 6 (six) hours as needed for pain.         Marland Kitchen aspirin EC 325 MG tablet   Oral   Take 325 mg by mouth every morning.         . diazepam (VALIUM) 5 MG tablet   Oral   Take 1 tablet (5 mg total) by mouth at bedtime as needed for sleep.   15 tablet   0   . diazepam (VALIUM) 5 MG tablet   Oral   Take 1 tablet (5 mg total) by mouth at bedtime as needed for anxiety or sleep (spasms).   20 tablet   0   . ibuprofen (ADVIL,MOTRIN) 600 MG tablet   Oral   Take 600 mg by mouth every 6 (six) hours as needed for  pain.         . metoprolol succinate (TOPROL-XL) 25 MG 24 hr tablet   Oral   Take 25 mg by mouth daily.           . nitroGLYCERIN (NITROSTAT) 0.4 MG SL tablet   Sublingual   Place 0.4 mg under the tongue every 5 (five) minutes x 3 doses as needed. For chest pain         . oxyCODONE-acetaminophen (PERCOCET) 5-325 MG per tablet   Oral   Take 1-2 tablets by mouth every 4 (four) hours as needed for pain.   20 tablet   0   . sildenafil (VIAGRA) 100 MG tablet   Oral   Take 100 mg by mouth daily as needed for erectile dysfunction.           BP 193/91  Pulse 92  Temp(Src) 97.5 F (36.4 C) (Oral)  Resp 16  SpO2 99%  Physical Exam  Constitutional: He is oriented to person, place, and time. He appears well-developed and well-nourished.  HENT:  Head: Normocephalic and atraumatic.  Mouth/Throat: Oropharynx is clear and moist. No oropharyngeal exudate.  Eyes: EOM  are normal. Pupils are equal, round, and reactive to light.  Neck: Neck supple.  Cardiovascular: Normal rate, regular rhythm and intact distal pulses.   Pulmonary/Chest: Effort normal and breath sounds normal. No respiratory distress.  Abdominal: Soft. Bowel sounds are normal. He exhibits no distension. There is no tenderness.  Musculoskeletal: Normal range of motion. He exhibits no edema.  Neurological: He is alert and oriented to person, place, and time. No cranial nerve deficit.  Skin: Skin is warm and dry.    ED Course  Procedures (including critical care time)  Results for orders placed during the hospital encounter of 02/22/13  GLUCOSE, CAPILLARY      Result Value Range   Glucose-Capillary 121 (*) 70 - 99 mg/dL  POCT I-STAT, CHEM 8      Result Value Range   Sodium 139  135 - 145 mEq/L   Potassium 3.9  3.5 - 5.1 mEq/L   Chloride 103  96 - 112 mEq/L   BUN 16  6 - 23 mg/dL   Creatinine, Ser 1.61  0.50 - 1.35 mg/dL   Glucose, Bld 096 (*) 70 - 99 mg/dL   Calcium, Ion 0.45  4.09 - 1.30 mmol/L   TCO2 28  0 - 100 mmol/L   Hemoglobin 15.6  13.0 - 17.0 g/dL   HCT 81.1  91.4 - 78.2 %  POCT I-STAT TROPONIN I      Result Value Range   Troponin i, poc 0.00  0.00 - 0.08 ng/mL   Comment 3              Date: 02/22/2013  Rate: 83  Rhythm: normal sinus rhythm  QRS Axis: normal  Intervals: normal  ST/T Wave abnormalities: nonspecific ST changes  Conduction Disutrbances:none  Narrative Interpretation: sinus with baseline wander and nonspecific changes  Old EKG Reviewed: unchanged  Patient placed in a quiet dark room - had taken Valium as prior to arrival. Recheck blood pressure normalized. Plan continue medications as prescribed followup with primary care physician. DASH diet instructions provided  MDM  Hypertension and anxiety  Symptomatically improved in the ED  Evaluated with EKG and labs reviewed as above        Sunnie Nielsen, MD 02/22/13 [phone]

## 2013-02-22 NOTE — ED Notes (Signed)
Pt. Requesting CBG to be taken. States "I think I'm borderline diabetic because it was 100 one time".

## 2013-06-04 DIAGNOSIS — I10 Essential (primary) hypertension: Secondary | ICD-10-CM | POA: Diagnosis not present

## 2013-06-04 DIAGNOSIS — I2510 Atherosclerotic heart disease of native coronary artery without angina pectoris: Secondary | ICD-10-CM | POA: Diagnosis not present

## 2013-06-04 DIAGNOSIS — E785 Hyperlipidemia, unspecified: Secondary | ICD-10-CM | POA: Diagnosis not present

## 2013-06-04 DIAGNOSIS — R748 Abnormal levels of other serum enzymes: Secondary | ICD-10-CM | POA: Diagnosis not present

## 2013-06-28 ENCOUNTER — Ambulatory Visit (INDEPENDENT_AMBULATORY_CARE_PROVIDER_SITE_OTHER): Payer: Medicare Other | Admitting: Internal Medicine

## 2013-06-28 VITALS — BP 132/76 | HR 79 | Temp 98.0°F | Resp 17 | Ht 70.0 in | Wt 248.0 lb

## 2013-06-28 DIAGNOSIS — J039 Acute tonsillitis, unspecified: Secondary | ICD-10-CM

## 2013-06-28 DIAGNOSIS — R6883 Chills (without fever): Secondary | ICD-10-CM | POA: Diagnosis not present

## 2013-06-28 DIAGNOSIS — J020 Streptococcal pharyngitis: Secondary | ICD-10-CM

## 2013-06-28 DIAGNOSIS — J029 Acute pharyngitis, unspecified: Secondary | ICD-10-CM

## 2013-06-28 DIAGNOSIS — R202 Paresthesia of skin: Secondary | ICD-10-CM

## 2013-06-28 DIAGNOSIS — R209 Unspecified disturbances of skin sensation: Secondary | ICD-10-CM

## 2013-06-28 LAB — POCT CBC
Granulocyte percent: 71 %G (ref 37–80)
HCT, POC: 49.7 % (ref 43.5–53.7)
POC Granulocyte: 9.2 — AB (ref 2–6.9)
POC LYMPH PERCENT: 22.2 %L (ref 10–50)
RDW, POC: 12.6 %

## 2013-06-28 LAB — GLUCOSE, POCT (MANUAL RESULT ENTRY): POC Glucose: 92 mg/dl (ref 70–99)

## 2013-06-28 LAB — POCT GLYCOSYLATED HEMOGLOBIN (HGB A1C): Hemoglobin A1C: 5.7

## 2013-06-28 MED ORDER — AMOXICILLIN 500 MG PO CAPS: 1000.0000 mg | ORAL_CAPSULE | Freq: Two times a day (BID) | ORAL | Status: DC

## 2013-06-28 MED ORDER — OXYCODONE-ACETAMINOPHEN 5-325 MG PO TABS: 1.0000 | ORAL_TABLET | Freq: Three times a day (TID) | ORAL | Status: DC | PRN

## 2013-06-28 MED ORDER — CEFTRIAXONE SODIUM 1 G IJ SOLR
1.0000 g | Freq: Once | INTRAMUSCULAR | Status: AC
  Administered 2013-06-28: 1 g via INTRAMUSCULAR

## 2013-06-28 NOTE — Progress Notes (Signed)
  Subjective:    Patient ID: [name], male    DOB: 1946-11-28, 67 y.o.   MRN: [id number]  HPI 2 days fever, chills, very st. No chest sxs, no stiff neck. Has 66yo and 66yo at home  Review of Systems CABG    Objective:   Physical Exam  Vitals reviewed. Constitutional: He is oriented to person, place, and time. He appears well-developed and well-nourished. No distress.  HENT:  Right Ear: External ear normal.  Left Ear: External ear normal.  Nose: Nose normal.  Mouth/Throat: Oropharyngeal exudate present.  Eyes: EOM are normal.  Neck: Normal range of motion. Neck supple. No thyromegaly present.  Cardiovascular: Normal rate, regular rhythm and normal heart sounds.   Pulmonary/Chest: Effort normal and breath sounds normal.  Abdominal: Soft. There is no tenderness.  Musculoskeletal: Normal range of motion.  Neurological: He is alert and oriented to person, place, and time. No cranial nerve deficit. He exhibits normal muscle tone. Coordination normal.  Psychiatric: He has a normal mood and affect.   Results for orders placed in visit on 06/28/13  POCT CBC      Result Value Range   WBC 13.0 (*) 4.6 - 10.2 K/uL   Lymph, poc 2.9  0.6 - 3.4   POC LYMPH PERCENT 22.2  10 - 50 %L   MID (cbc) 0.9  0 - 0.9   POC MID % 6.8  0 - 12 %M   POC Granulocyte 9.2 (*) 2 - 6.9   Granulocyte percent 71.0  37 - 80 %G   RBC 5.24  4.69 - 6.13 M/uL   Hemoglobin 16.1  14.1 - 18.1 g/dL   HCT, POC 78.2  95.6 - 53.7 %   MCV 94.8  80 - 97 fL   MCH, POC 30.7  27 - 31.2 pg   MCHC 32.4  31.8 - 35.4 g/dL   RDW, POC 21.3     Platelet Count, POC 154  142 - 424 K/uL   MPV 11.7  0 - 99.8 fL  GLUCOSE, POCT (MANUAL RESULT ENTRY)      Result Value Range   POC Glucose 92  70 - 99 mg/dl  POCT GLYCOSYLATED HEMOGLOBIN (HGB A1C)      Result Value Range   Hemoglobin A1C 5.7    POCT RAPID STREP A (OFFICE)      Result Value Range   Rapid Strep A Screen Positive (*) Negative          Assessment & Plan:   Tonsillitis/Otalgia Rocphen 1g/Amoxil 1g bid Requests oxycodone for pain/takes without side affects

## 2013-06-28 NOTE — Patient Instructions (Addendum)
Tonsillitis Tonsils are lumps of lymphoid tissues at the back of the throat. Each tonsil has 20 crevices (crypts). Tonsils help fight nose and throat infections and keep infection from spreading to other parts of the body for the first 18 months of life. Tonsillitis is an infection of the throat that causes the tonsils to become red, tender, and swollen. CAUSES Sudden and, if treated, temporary (acute) tonsillitis is usually caused by infection with streptococcal bacteria. Long lasting (chronic) tonsillitis occurs when the crypts of the tonsils become filled with pieces of food and bacteria, which makes it easy for the tonsils to become constantly infected. SYMPTOMS  Symptoms of tonsillitis include:  A sore throat.  White patches on the tonsils.  Fever.  Tiredness. DIAGNOSIS Tonsillitis can be diagnosed through a physical exam. Diagnosis can be confirmed with the results of lab tests, including a throat culture. TREATMENT  The goals of tonsillitis treatment include the reduction of the severity and duration of symptoms, prevention of associated conditions, and prevention of disease transmission. Tonsillitis caused by bacteria can be treated with antibiotics. Usually, treatment with antibiotics is started before the cause of the tonsillitis is known. However, if it is determined that the cause is not bacterial, antibiotics will not treat the tonsillitis. If attacks of tonsillitis are severe and frequent, your caregiver may recommend surgery to remove the tonsils (tonsillectomy). HOME CARE INSTRUCTIONS   Rest as much as possible and get plenty of sleep.  Drink plenty of fluids. While the throat is very sore, eat soft foods or liquids, such as sherbet, soups, or instant breakfast drinks.  Eat frozen ice pops.  Older children and adults may gargle with a warm or cold liquid to help soothe the throat. Mix 1 teaspoon of salt in 1 cup of water.  Other family members who also develop a sore  throat or fever should have a medical exam or throat culture.  Only take over-the-counter or prescription medicines for pain, discomfort, or fever as directed by your caregiver.  If you are given antibiotics, take them as directed. Finish them even if you start to feel better. SEEK MEDICAL CARE IF:   Your baby is older than 3 months with a rectal temperature of 100.5 F (38.1 C) or higher for more than 1 day.  Large, tender lumps develop in your neck.  A rash develops.  Green, yellow-brown, or bloody substance is coughed up.  You are unable to swallow liquids or food for 24 hours.  Your child is unable to swallow food or liquids for 12 hours. SEEK IMMEDIATE MEDICAL CARE IF:   You develop any new symptoms such as vomiting, severe headache, stiff neck, chest pain, or trouble breathing or swallowing.  You have severe throat pain along with drooling or voice changes.  You have severe pain, unrelieved with recommended medications.  You are unable to fully open the mouth.  You develop redness, swelling, or severe pain anywhere in the neck.  You have a fever.  Your baby is older than 3 months with a rectal temperature of 102 F (38.9 C) or higher.  Your baby is 37 months old or younger with a rectal temperature of 100.4 F (38 C) or higher. MAKE SURE YOU:   Understand these instructions.  Will watch your condition.  Will get help right away if you are not doing well or get worse. Document Released: 08/25/2005 Document Revised: 02/07/2012 Document Reviewed: 01/21/2011 [facility] Patient Information 2014 [place], [place]. Faringite estreptoccica (Strep Throat)  A garganta  inflamada  uma infeco causada pela bactria chamada streptococcus pyogenes. Seu mdico pode chamar a infeco por streptococcus de "tonsilite" ou faringite", de acordo com os sinais de inflamao nas tonsilas ou na parte de trs da garganta. A garganta inflamada  mais comum em crianas de 5 a 15 anos durante  meses frios do ano, mas pode ocorrer em pessoas de qualquer idade durante qualquer estao. Esta infeco se espalha de pessoa para pessoa (contagiosa) atravs de tosse, espirros ou contato prximo.  SINTOMAS  Febre ou calafrios.  Tonsilas ou garganta dolorida, inchada e vermelha.  Dor ou dificuldade de ingesto.  Pontos brancos ou amarelos nas amgdalas ou garganta.  Ndulos linfticos inchados e sensveis ou "glndulas" do pescoo ou sob a mandbula.  Erupo avermelhada por todo o corpo (pouco comum). DIAGNSTICO Muitas infeces diferentes podem causar os mesmos sintomas. Deve ser feito um teste para confirmar o diagnstico para que o tratamento correto possa ser aplicado. Um teste rpido de streptococcus pode ajudar seu mdico a realizar o diagnstico em poucos minutos. Caso o teste no esteja disponvel, um cotonete pequeno na rea infectada pode ser usado para um teste bacteriano na garganta. Caso um teste bacteriano seja realizado, os resultados so disponibilizados em um ou dois dias.  TRATAMENTO A faringite estreptoccica  tratada com antibiticos. INSTRUES PARA TRATAMENTO DOMICILIAR  Faa gargarejos com 1 colher de ch de sal em 1 xcara de gua morna, 3 a 4 vezes ao dia ou o que for necessrio para sentir-se melhor.  Os membros da famlia que tambm tenham garganta inflamada ou febre devem realizar o teste de garganta e serem tratados com antibiticos caso tenham a infeco por streptococcus.  Certifique-se de que todos em sua casa limpem bem suas mos.  No divida comida, copos ou itens pessoais que possam fazer com que a infeco se espalhe para outras pessoas.  Voc pode precisar manter dieta com comidas leves at [employer].  Aura Fey e lquidos suficientes para manter sua urina clara ou amarelo clara. Isto ajuda a prevenir a desidratao.  Descanse bastante.  No v a escola ou trabalho caso tenha feito administrao de antibiticos nas ltimas 24  horas.  Tome medicamentos para dor, desconforto ou febre de venda livre ou restrita, nica e exclusivamente sob prescrio mdica.  Caso antibiticos sejam prescritos tome-os conforme indicado. Termine de tomar todos mesmo caso j se sinta melhor. PROCURE UM MDICO SE:  As glndulas em seu pescoo continuam a crescer.  Apresentar erupo, tosse ou dor de ouvido.  Apresentar escarro verde, amarelo-amarronzado ou com sangue.  Tiver dor ou desconforto no controlado pelos medicamentos.  Os problemas estiverem piorando em Engineer, structural. PROCURE UM MDICO IMEDIATAMENTE SE:  Apresentar novos sintomas como vmito, forte dor de cabea, torcicolo, dor no peito, falta de ar, dificuldade para respirar e engolir.  Apresentar forte dor de garganta, baba ou alteraes na voz.  Apresentar inchao no pescoo, ou se a pele do pescoo ficar vermelha e sensvel.  Tiver febre.  Voc desenvolve sinais de desidratao, como fadiga, boca seca e diminuio no fluxo urinrio.  Voc sente sono ou no consegue acordar completamente. Document Released: 11/15/2005 Document Revised: 11/01/2012 [facility] Patient Information 2014 [place], [place].

## 2013-11-13 ENCOUNTER — Encounter (HOSPITAL_COMMUNITY): Payer: Self-pay | Admitting: Emergency Medicine

## 2013-11-13 ENCOUNTER — Emergency Department (HOSPITAL_COMMUNITY)
Admission: EM | Admit: 2013-11-13 | Discharge: 2013-11-13 | Disposition: A | Discharge status: Home / Self Care | Payer: Medicare Other | Attending: Emergency Medicine | Admitting: Emergency Medicine

## 2013-11-13 DIAGNOSIS — I2510 Atherosclerotic heart disease of native coronary artery without angina pectoris: Secondary | ICD-10-CM | POA: Insufficient documentation

## 2013-11-13 DIAGNOSIS — G8929 Other chronic pain: Secondary | ICD-10-CM | POA: Insufficient documentation

## 2013-11-13 DIAGNOSIS — Z8639 Personal history of other endocrine, nutritional and metabolic disease: Secondary | ICD-10-CM | POA: Insufficient documentation

## 2013-11-13 DIAGNOSIS — I209 Angina pectoris, unspecified: Secondary | ICD-10-CM | POA: Diagnosis not present

## 2013-11-13 DIAGNOSIS — Z95818 Presence of other cardiac implants and grafts: Secondary | ICD-10-CM | POA: Diagnosis not present

## 2013-11-13 DIAGNOSIS — F329 Major depressive disorder, single episode, unspecified: Secondary | ICD-10-CM | POA: Diagnosis not present

## 2013-11-13 DIAGNOSIS — R42 Dizziness and giddiness: Secondary | ICD-10-CM | POA: Insufficient documentation

## 2013-11-13 DIAGNOSIS — M545 Low back pain, unspecified: Secondary | ICD-10-CM | POA: Insufficient documentation

## 2013-11-13 DIAGNOSIS — F419 Anxiety disorder, unspecified: Secondary | ICD-10-CM

## 2013-11-13 DIAGNOSIS — F3289 Other specified depressive episodes: Secondary | ICD-10-CM | POA: Insufficient documentation

## 2013-11-13 DIAGNOSIS — R450 Nervousness: Secondary | ICD-10-CM | POA: Diagnosis not present

## 2013-11-13 DIAGNOSIS — Z7982 Long term (current) use of aspirin: Secondary | ICD-10-CM | POA: Insufficient documentation

## 2013-11-13 DIAGNOSIS — I10 Essential (primary) hypertension: Secondary | ICD-10-CM | POA: Diagnosis not present

## 2013-11-13 DIAGNOSIS — F411 Generalized anxiety disorder: Secondary | ICD-10-CM | POA: Diagnosis not present

## 2013-11-13 DIAGNOSIS — M1710 Unilateral primary osteoarthritis, unspecified knee: Secondary | ICD-10-CM | POA: Insufficient documentation

## 2013-11-13 DIAGNOSIS — Z951 Presence of aortocoronary bypass graft: Secondary | ICD-10-CM | POA: Insufficient documentation

## 2013-11-13 DIAGNOSIS — Z8709 Personal history of other diseases of the respiratory system: Secondary | ICD-10-CM | POA: Diagnosis not present

## 2013-11-13 DIAGNOSIS — Z79899 Other long term (current) drug therapy: Secondary | ICD-10-CM | POA: Insufficient documentation

## 2013-11-13 DIAGNOSIS — Z87891 Personal history of nicotine dependence: Secondary | ICD-10-CM | POA: Insufficient documentation

## 2013-11-13 DIAGNOSIS — Z862 Personal history of diseases of the blood and blood-forming organs and certain disorders involving the immune mechanism: Secondary | ICD-10-CM | POA: Insufficient documentation

## 2013-11-13 DIAGNOSIS — F410 Panic disorder [episodic paroxysmal anxiety] without agoraphobia: Secondary | ICD-10-CM | POA: Diagnosis not present

## 2013-11-13 LAB — CBC WITH DIFFERENTIAL/PLATELET
Basophils Relative: 0 % (ref 0–1)
Eosinophils Absolute: 0.1 10*3/uL (ref 0.0–0.7)
Hemoglobin: 16.3 g/dL (ref 13.0–17.0)
Lymphs Abs: 1.9 10*3/uL (ref 0.7–4.0)
MCHC: 34.8 g/dL (ref 30.0–36.0)
Monocytes Relative: 11 % (ref 3–12)
Neutro Abs: 2.9 10*3/uL (ref 1.7–7.7)
Neutrophils Relative %: 53 % (ref 43–77)
Platelets: 142 10*3/uL — ABNORMAL LOW (ref 150–400)
RBC: 5.14 MIL/uL (ref 4.22–5.81)

## 2013-11-13 LAB — BASIC METABOLIC PANEL
BUN: 17 mg/dL (ref 6–23)
Chloride: 100 mEq/L (ref 96–112)
GFR calc Af Amer: >90 mL/min (ref >90)
GFR calc non Af Amer: 88 mL/min — ABNORMAL LOW (ref >90)
Potassium: 3.8 mEq/L (ref 3.5–5.1)
Sodium: 135 mEq/L (ref 135–145)

## 2013-11-13 MED ORDER — ALPRAZOLAM 0.5 MG PO TABS: 0.5000 mg | ORAL_TABLET | Freq: Three times a day (TID) | ORAL | Status: DC | PRN

## 2013-11-13 MED ORDER — ALPRAZOLAM 0.25 MG PO TABS
0.5000 mg | ORAL_TABLET | Freq: Once | ORAL | Status: AC
  Administered 2013-11-13: 0.5 mg via ORAL
  Filled 2013-11-13: qty 2

## 2013-11-13 MED ORDER — LISINOPRIL 10 MG PO TABS: 10.0000 mg | ORAL_TABLET | Freq: Every day | ORAL | Status: DC

## 2013-11-13 NOTE — ED Provider Notes (Signed)
CSN: [id number]     Arrival date & time 11/13/13  0630 History   First MD Initiated Contact with Patient 11/13/13 [phone]     Chief Complaint  Patient presents with  . Hypertension   (Consider location/radiation/quality/duration/timing/severity/associated sxs/prior Treatment) HPI Comments: Patient presents to the ER for evaluation of dizziness and elevated blood pressure. Patient has a history of hypertension, takes Toprol-XL. He has not seen his doctor or his cardiologist in some time. Patient has previously been diagnosed with anxiety and panic attacks. Is not sure if it was anxiety causing his blood pressure be elevated. He discovered his blood pressure was 204/101 at home earlier when he checked it because he was feeling dizzy. He reports feeling dizziness and a sense of spinning when he changes positions or moves his head. Has not been any chest pain, or palpitations or shortness of breath.  Patient is a 67 y.o. male presenting with hypertension.  Hypertension Pertinent negatives include no chest pain, no headaches and no shortness of breath.    Past Medical History  Diagnosis Date  . Hypertension   . Coronary artery disease   . High cholesterol   . Anxiety   . Panic attacks   . Anginal pain 01/2005  . History of bronchitis     "used to get it alot" (08/25/2012)  . Arthritis     "knees"  . Chronic lower back pain   . PTSD (post-traumatic stress disorder)     "have been treated in the past" (08/25/2012)  . Depression    Past Surgical History  Procedure Laterality Date  . Coronary artery bypass graft  01/2005    CABG X3  . Cardiac catheterization  01/2005  . Varicose vein surgery  1980's    LLE  . Shrapnel  1969    LLE; left lateral thumb (required grafting)   Family History  Problem Relation Age of Onset  . Diabetes Mother   . Heart disease Father   . Cancer Brother    History  Substance Use Topics  . Smoking status: Former Smoker -- 2.00 packs/day for 5 years  .  Smokeless tobacco: Never Used     Comment: 08/25/2012 "quit smoking cigarettes 40 yr ago"  . Alcohol Use: Yes    Review of Systems  Respiratory: Negative for shortness of breath.   Cardiovascular: Negative for chest pain.  Neurological: Positive for dizziness. Negative for headaches.  Psychiatric/Behavioral: The patient is nervous/anxious.   All other systems reviewed and are negative.    Allergies  Codeine and Vicodin  Home Medications   Current Outpatient Rx  Name  Route  Sig  Dispense  Refill  . acetaminophen (TYLENOL) 325 MG tablet   Oral   Take 650 mg by mouth every 6 (six) hours as needed for pain.         Marland Kitchen aspirin EC 325 MG tablet   Oral   Take 325 mg by mouth every morning.         . diazepam (VALIUM) 5 MG tablet   Oral   Take 1 tablet (5 mg total) by mouth at bedtime as needed for anxiety or sleep (spasms).   20 tablet   0   . ibuprofen (ADVIL,MOTRIN) 600 MG tablet   Oral   Take 600 mg by mouth every 6 (six) hours as needed for pain.         . metoprolol succinate (TOPROL-XL) 25 MG 24 hr tablet   Oral   Take 25 mg by  mouth daily.           . nitroGLYCERIN (NITROSTAT) 0.4 MG SL tablet   Sublingual   Place 0.4 mg under the tongue every 5 (five) minutes x 3 doses as needed. For chest pain         . oxyCODONE-acetaminophen (PERCOCET) 5-325 MG per tablet   Oral   Take 1-2 tablets by mouth every 4 (four) hours as needed for pain.   20 tablet   0   . oxyCODONE-acetaminophen (ROXICET) 5-325 MG per tablet   Oral   Take 1 tablet by mouth every 8 (eight) hours as needed for pain.   20 tablet   0   . sildenafil (VIAGRA) 100 MG tablet   Oral   Take 100 mg by mouth daily as needed for erectile dysfunction.          BP 147/66  Pulse 55  Temp(Src) 98 F (36.7 C) (Oral)  Resp 12  Ht 5\' 10"  (1.778 m)  Wt 248 lb (112.492 kg)  BMI 35.58 kg/m2  SpO2 90% Physical Exam  Constitutional: He is oriented to person, place, and time. He appears  well-developed and well-nourished. No distress.  HENT:  Head: Normocephalic and atraumatic.  Right Ear: Hearing normal.  Left Ear: Hearing normal.  Nose: Nose normal.  Mouth/Throat: Oropharynx is clear and moist and mucous membranes are normal.  Eyes: Conjunctivae and EOM are normal. Pupils are equal, round, and reactive to light.  Neck: Normal range of motion. Neck supple.  Cardiovascular: Regular rhythm, S1 normal and S2 normal.  Exam reveals no gallop and no friction rub.   No murmur heard. Pulmonary/Chest: Effort normal and breath sounds normal. No respiratory distress. He exhibits no tenderness.  Abdominal: Soft. Normal appearance and bowel sounds are normal. There is no hepatosplenomegaly. There is no tenderness. There is no rebound, no guarding, no tenderness at McBurney's point and negative Murphy's sign. No hernia.  Musculoskeletal: Normal range of motion.  Neurological: He is alert and oriented to person, place, and time. He has normal strength. No cranial nerve deficit or sensory deficit. Coordination normal. GCS eye subscore is 4. GCS verbal subscore is 5. GCS motor subscore is 6.  Skin: Skin is warm, dry and intact. No rash noted. No cyanosis.  Psychiatric: He has a normal mood and affect. His speech is normal and behavior is normal. Thought content normal.    ED Course  Procedures (including critical care time) Labs Review Labs Reviewed  CBC WITH DIFFERENTIAL  BASIC METABOLIC PANEL  TROPONIN I   Imaging Review No results found.  EKG Interpretation   None       Date: 11/13/2013  Rate: 59  Rhythm: normal sinus rhythm  QRS Axis: normal  Intervals: normal  ST/T Wave abnormalities: nonspecific T wave changes  Conduction Disutrbances:none  Narrative Interpretation:   Old EKG Reviewed: unchanged    MDM  Diagnosis: 1. Hypertension 2. Anxiety  Patient presents to the ER for evaluation of dizziness and elevated blood pressure. Patient is currently taking a beta  blocker. He is exhibiting sinus bradycardia which is secondary to the beta blocker. He was hypotensive earlier and remains somewhat hypertensive here in the ER. Patient is extremely anxious and likely this is escalating his hypertension. He has dizziness, but his neurologic examination is unremarkable. No focal deficits. Blood work unremarkable. Patient reassured. Patient will be given lisinopril 10 mg to be used in addition to his Toprol-XL. Was prescribed Xanax to be used as needed.  Gilda Crease, MD 11/13/13 [phone]

## 2013-11-13 NOTE — ED Notes (Signed)
Pt called this nurse into rm after ambulating to bathroom. States "I am dizzy, my oxygen drops to 90, my heart rate is in the 50's, I am concerned that I am not doing well. I don't feel good." pt reassured, Vital signs stable, adjusted pulse oximeter, this nurse turned monitor from pt's view- pt stated "I need to see that to make sure I am doing ok" monitor in view of pt. Dr. Blinda Leatherwood informed.

## 2013-11-13 NOTE — ED Notes (Signed)
Pt presents with c/o of high blood pressure of 204/101 at home.  Pt c/o dizziness and feeling lightheaded with the elevated BP at home.  Pt states he has a lot of anxiety that is work related.  Pt B/P in ED is 173/73.

## 2013-11-13 NOTE — ED Notes (Signed)
Pt states feeling a little better.

## 2013-11-13 NOTE — ED Notes (Signed)
Dr. Pollina at bedside   

## 2013-12-11 ENCOUNTER — Ambulatory Visit (INDEPENDENT_AMBULATORY_CARE_PROVIDER_SITE_OTHER): Payer: Medicare Other | Admitting: Emergency Medicine

## 2013-12-11 VITALS — BP 126/80 | HR 65 | Temp 98.4°F | Resp 16 | Ht 69.5 in | Wt 248.0 lb

## 2013-12-11 DIAGNOSIS — I10 Essential (primary) hypertension: Secondary | ICD-10-CM | POA: Diagnosis not present

## 2013-12-11 DIAGNOSIS — F4310 Post-traumatic stress disorder, unspecified: Secondary | ICD-10-CM

## 2013-12-11 DIAGNOSIS — F411 Generalized anxiety disorder: Secondary | ICD-10-CM | POA: Diagnosis not present

## 2013-12-11 MED ORDER — ALPRAZOLAM 0.5 MG PO TABS: 0.5000 mg | ORAL_TABLET | Freq: Three times a day (TID) | ORAL | Status: DC | PRN

## 2013-12-11 NOTE — Patient Instructions (Signed)

## 2013-12-11 NOTE — Progress Notes (Signed)
[facility] [address], [place] [id number] [phone]  Date:  12/11/2013   Name:  [name]   DOB:  1946/03/02   MRN:  [id number]  PCP:  Lesleigh NoeSMITH III,HENRY W, MD    Chief Complaint: Medication Refill   History of Present Illness:  [name] is a 68 y.o. very pleasant male patient who presents with the following:  History of hypertension and anxiety and CAD.  Needs refills on his medications.  No chest pain, tightness or pressure.  No shortness of breath.  No wheezing.  No peripheral edema.  No improvement with over the counter medications or other home remedies. Denies other complaint or health concern today.   There are no active problems to display for this patient.   Past Medical History  Diagnosis Date  . Hypertension   . Coronary artery disease   . High cholesterol   . Anxiety   . Panic attacks   . Anginal pain 01/2005  . History of bronchitis     "used to get it alot" (08/25/2012)  . Arthritis     "knees"  . Chronic lower back pain   . PTSD (post-traumatic stress disorder)     "have been treated in the past" (08/25/2012)  . Depression     Past Surgical History  Procedure Laterality Date  . Coronary artery bypass graft  01/2005    CABG X3  . Cardiac catheterization  01/2005  . Varicose vein surgery  1980's    LLE  . Shrapnel  1969    LLE; left lateral thumb (required grafting)    History  Substance Use Topics  . Smoking status: Former Smoker -- 2.00 packs/day for 5 years  . Smokeless tobacco: Never Used     Comment: 08/25/2012 "quit smoking cigarettes 40 yr ago"  . Alcohol Use: Yes    Family History  Problem Relation Age of Onset  . Diabetes Mother   . Heart disease Father   . Cancer Brother     Allergies  Allergen Reactions  . Codeine Palpitations  . Vicodin [Hydrocodone-Acetaminophen] Palpitations    Medication list has been reviewed and updated.  Current Outpatient Prescriptions on File Prior to Visit   Medication Sig Dispense Refill  . lisinopril (PRINIVIL,ZESTRIL) 10 MG tablet Take 1 tablet (10 mg total) by mouth daily.  30 tablet  0  . metoprolol succinate (TOPROL-XL) 25 MG 24 hr tablet Take 25 mg by mouth daily.        Marland Kitchen. acetaminophen (TYLENOL) 325 MG tablet Take 650 mg by mouth every 6 (six) hours as needed for pain.      Marland Kitchen. ALPRAZolam (XANAX) 0.5 MG tablet Take 1 tablet (0.5 mg total) by mouth 3 (three) times daily as needed for anxiety.  10 tablet  0  . aspirin EC 325 MG tablet Take 325 mg by mouth every morning.      . diazepam (VALIUM) 5 MG tablet Take 1 tablet (5 mg total) by mouth at bedtime as needed for anxiety or sleep (spasms).  20 tablet  0  . ibuprofen (ADVIL,MOTRIN) 600 MG tablet Take 600 mg by mouth every 6 (six) hours as needed for pain.      . nitroGLYCERIN (NITROSTAT) 0.4 MG SL tablet Place 0.4 mg under the tongue every 5 (five) minutes x 3 doses as needed. For chest pain      . sildenafil (VIAGRA) 100 MG tablet Take 100 mg by mouth daily as needed  for erectile dysfunction.       No current facility-administered medications on file prior to visit.    Review of Systems:  As per HPI, otherwise negative.    Physical Examination: Filed Vitals:   12/11/13 1350  BP: 126/80  Pulse: 65  Temp: 98.4 F (36.9 C)  Resp: 16   Filed Vitals:   12/11/13 1350  Height: 5' 9.5" (1.765 m)  Weight: 248 lb (112.492 kg)   Body mass index is 36.11 kg/(m^2). Ideal Body Weight: Weight in (lb) to have BMI = 25: 171.4  GEN: obese, NAD, Non-toxic, A & O x 3 HEENT: Atraumatic, Normocephalic. Neck supple. No masses, No LAD. Ears and Nose: No external deformity. CV: RRR, No M/G/R. No JVD. No thrill. No extra heart sounds. PULM: CTA B, no wheezes, crackles, rhonchi. No retractions. No resp. distress. No accessory muscle use. ABD: S, NT, ND, +BS. No rebound. No HSM. EXTR: No c/c/e NEURO Normal gait.  PSYCH: Normally interactive. Conversant. Not depressed or anxious appearing.  Calm  demeanor.    Assessment and Plan: Hypertension Anxiety PTSD CAD  Signed,  Phillips Odor, MD

## 2014-01-07 ENCOUNTER — Encounter (HOSPITAL_BASED_OUTPATIENT_CLINIC_OR_DEPARTMENT_OTHER): Payer: Self-pay | Admitting: Emergency Medicine

## 2014-01-07 ENCOUNTER — Emergency Department (HOSPITAL_BASED_OUTPATIENT_CLINIC_OR_DEPARTMENT_OTHER)
Admission: EM | Admit: 2014-01-07 | Discharge: 2014-01-07 | Disposition: A | Discharge status: Home / Self Care | Payer: Medicare Other | Attending: Emergency Medicine | Admitting: Emergency Medicine

## 2014-01-07 DIAGNOSIS — J069 Acute upper respiratory infection, unspecified: Secondary | ICD-10-CM | POA: Diagnosis not present

## 2014-01-07 DIAGNOSIS — Z8639 Personal history of other endocrine, nutritional and metabolic disease: Secondary | ICD-10-CM | POA: Insufficient documentation

## 2014-01-07 DIAGNOSIS — R040 Epistaxis: Secondary | ICD-10-CM | POA: Diagnosis not present

## 2014-01-07 DIAGNOSIS — Z79899 Other long term (current) drug therapy: Secondary | ICD-10-CM | POA: Diagnosis not present

## 2014-01-07 DIAGNOSIS — M1710 Unilateral primary osteoarthritis, unspecified knee: Secondary | ICD-10-CM | POA: Diagnosis not present

## 2014-01-07 DIAGNOSIS — Z87891 Personal history of nicotine dependence: Secondary | ICD-10-CM | POA: Insufficient documentation

## 2014-01-07 DIAGNOSIS — F3289 Other specified depressive episodes: Secondary | ICD-10-CM | POA: Diagnosis not present

## 2014-01-07 DIAGNOSIS — F329 Major depressive disorder, single episode, unspecified: Secondary | ICD-10-CM | POA: Diagnosis not present

## 2014-01-07 DIAGNOSIS — Z9889 Other specified postprocedural states: Secondary | ICD-10-CM | POA: Insufficient documentation

## 2014-01-07 DIAGNOSIS — I2510 Atherosclerotic heart disease of native coronary artery without angina pectoris: Secondary | ICD-10-CM | POA: Insufficient documentation

## 2014-01-07 DIAGNOSIS — J40 Bronchitis, not specified as acute or chronic: Secondary | ICD-10-CM | POA: Diagnosis not present

## 2014-01-07 DIAGNOSIS — I10 Essential (primary) hypertension: Secondary | ICD-10-CM | POA: Diagnosis not present

## 2014-01-07 DIAGNOSIS — Z8709 Personal history of other diseases of the respiratory system: Secondary | ICD-10-CM | POA: Diagnosis not present

## 2014-01-07 DIAGNOSIS — G8929 Other chronic pain: Secondary | ICD-10-CM | POA: Diagnosis not present

## 2014-01-07 DIAGNOSIS — Z951 Presence of aortocoronary bypass graft: Secondary | ICD-10-CM | POA: Diagnosis not present

## 2014-01-07 DIAGNOSIS — Z7982 Long term (current) use of aspirin: Secondary | ICD-10-CM | POA: Insufficient documentation

## 2014-01-07 DIAGNOSIS — Z862 Personal history of diseases of the blood and blood-forming organs and certain disorders involving the immune mechanism: Secondary | ICD-10-CM | POA: Insufficient documentation

## 2014-01-07 DIAGNOSIS — IMO0002 Reserved for concepts with insufficient information to code with codable children: Secondary | ICD-10-CM

## 2014-01-07 DIAGNOSIS — F410 Panic disorder [episodic paroxysmal anxiety] without agoraphobia: Secondary | ICD-10-CM | POA: Diagnosis not present

## 2014-01-07 MED ORDER — OXYCODONE-ACETAMINOPHEN 5-325 MG PO TABS
1.0000 | ORAL_TABLET | Freq: Once | ORAL | Status: AC
  Administered 2014-01-07: 1 via ORAL
  Filled 2014-01-07: qty 1

## 2014-01-07 MED ORDER — OXYMETAZOLINE HCL 0.05 % NA SOLN
1.0000 | Freq: Once | NASAL | Status: AC
  Administered 2014-01-07: 1 via NASAL

## 2014-01-07 MED ORDER — OXYMETAZOLINE HCL 0.05 % NA SOLN
NASAL | Status: AC
  Filled 2014-01-07: qty 15

## 2014-01-07 NOTE — ED Provider Notes (Signed)
CSN: [id number]     Arrival date & time 01/07/14  2114 History  This chart was scribed for Rolland PorterMark Nelli Swalley, MD by Smiley HousemanFallon Davis, ED Scribe. The patient was seen in room MH06/MH06. Patient's care was started at 9:25 PM.  Chief Complaint  Patient presents with  . Epistaxis   The history is provided by the patient. No language interpreter was used.   HPI Comments: Babs SciaraJoseph M Arko is a 68 y.o. male with a h/o HTN and CAD who presents to the Emergency Department complaining of multiple episodes of epistaxis that started 2 days ago.  Pt states that he is sneezing a lot, but he is trying to sneeze through his mouth.  Pt is currently having an epistaxis that started about 45 minutes ago.  Pt states that [facility] was [facility] for same complaint and they diagnosed him with an upper respiratory infection and bronchitis.  He was prescribed an albuterol inhaler and Zithromax.  Pt states that he used his inhaler and started to feel light headed.  Pt states he had coronary artery bypass graft in 02/2005.  Pt is taking 325 Aspirin for blood thinner daily.  Pt states that he currently takes xanax for anxiety.    Past Medical History  Diagnosis Date  . Hypertension   . Coronary artery disease   . High cholesterol   . Anxiety   . Panic attacks   . Anginal pain 01/2005  . History of bronchitis     "used to get it alot" (08/25/2012)  . Arthritis     "knees"  . Chronic lower back pain   . PTSD (post-traumatic stress disorder)     "have been treated in the past" (08/25/2012)  . Depression    Past Surgical History  Procedure Laterality Date  . Coronary artery bypass graft  01/2005    CABG X3  . Cardiac catheterization  01/2005  . Varicose vein surgery  1980's    LLE  . Shrapnel  1969    LLE; left lateral thumb (required grafting)   Family History  Problem Relation Age of Onset  . Diabetes Mother   . Heart disease Father   . Cancer Brother    History  Substance Use Topics  . Smoking status:  Former Smoker -- 2.00 packs/day for 5 years  . Smokeless tobacco: Never Used     Comment: 08/25/2012 "quit smoking cigarettes 40 yr ago"  . Alcohol Use: No    Review of Systems  Constitutional: Negative for fever, chills, diaphoresis, appetite change and fatigue.  HENT: Positive for nosebleeds. Negative for mouth sores, sore throat and trouble swallowing.   Eyes: Negative for visual disturbance.  Respiratory: Negative for cough, chest tightness, shortness of breath and wheezing.   Cardiovascular: Negative for chest pain.  Gastrointestinal: Negative for nausea, vomiting, abdominal pain, diarrhea and abdominal distention.  Endocrine: Negative for polydipsia, polyphagia and polyuria.  Genitourinary: Negative for dysuria, frequency and hematuria.  Musculoskeletal: Negative for gait problem.  Skin: Negative for color change, pallor and rash.  Neurological: Negative for dizziness, syncope, light-headedness and headaches.  Hematological: Does not bruise/bleed easily.  Psychiatric/Behavioral: Negative for behavioral problems and confusion.      Allergies  Codeine and Vicodin  Home Medications   Current Outpatient Rx  Name  Route  Sig  Dispense  Refill  . albuterol (PROVENTIL HFA;VENTOLIN HFA) 108 (90 BASE) MCG/ACT inhaler   Inhalation   Inhale into the lungs every 6 (six) hours as needed for  wheezing or shortness of breath.         Marland Kitchen azithromycin (ZITHROMAX) 250 MG tablet   Oral   Take by mouth daily.         Marland Kitchen acetaminophen (TYLENOL) 325 MG tablet   Oral   Take 650 mg by mouth every 6 (six) hours as needed for pain.         Marland Kitchen ALPRAZolam (XANAX) 0.5 MG tablet   Oral   Take 1 tablet (0.5 mg total) by mouth 3 (three) times daily as needed for anxiety.   90 tablet   2   . aspirin EC 325 MG tablet   Oral   Take 325 mg by mouth every morning.         . diazepam (VALIUM) 5 MG tablet   Oral   Take 1 tablet (5 mg total) by mouth at bedtime as needed for anxiety or sleep  (spasms).   20 tablet   0   . ibuprofen (ADVIL,MOTRIN) 600 MG tablet   Oral   Take 600 mg by mouth every 6 (six) hours as needed for pain.         Marland Kitchen lisinopril (PRINIVIL,ZESTRIL) 10 MG tablet   Oral   Take 1 tablet (10 mg total) by mouth daily.   30 tablet   0   . metoprolol succinate (TOPROL-XL) 25 MG 24 hr tablet   Oral   Take 25 mg by mouth daily.           . nitroGLYCERIN (NITROSTAT) 0.4 MG SL tablet   Sublingual   Place 0.4 mg under the tongue every 5 (five) minutes x 3 doses as needed. For chest pain         . sildenafil (VIAGRA) 100 MG tablet   Oral   Take 100 mg by mouth daily as needed for erectile dysfunction.          Triage Vitals: BP 191/77  Pulse 84  Temp(Src) 98.9 F (37.2 C) (Oral)  Resp 18  Ht 5\' 10"  (1.778 m)  Wt 148 lb (67.132 kg)  BMI 21.24 kg/m2  SpO2 95% Physical Exam  Nursing note and vitals reviewed. Constitutional: He appears well-developed and well-nourished. No distress.  HENT:  Head: Normocephalic and atraumatic.  Blood in right nares and posterior pharynx.  Exposed bleeder on right anterior septum.  Otherwise normal exam.    Eyes: Conjunctivae are normal. Right eye exhibits no discharge. Left eye exhibits no discharge.  Neck: Neck supple.  Cardiovascular: Normal rate, regular rhythm and normal heart sounds.  Exam reveals no gallop and no friction rub.   No murmur heard. Pulmonary/Chest: Effort normal and breath sounds normal. No respiratory distress.  Abdominal: Soft. He exhibits no distension. There is no tenderness.  Musculoskeletal: He exhibits no edema and no tenderness.  Neurological: He is alert.  Skin: Skin is warm and dry.  Psychiatric: He has a normal mood and affect. His behavior is normal. Thought content normal.    ED Course  Procedures (including critical care time) DIAGNOSTIC STUDIES: Oxygen Saturation is 95% on RA, adequate by my interpretation.    COORDINATION OF CARE: 9:35 PM-Patient informed of current  plan of treatment and evaluation and agrees with plan.    Labs Review Labs Reviewed - No data to display Imaging Review No results found.  EKG Interpretation   None       MDM   Final diagnoses:  Epistaxis  Hypertension    I personally performed the services described  in this documentation, which was scribed in my presence. The recorded information has been reviewed and is accurate.  Residual hemostasis after packing placed. A vascular recheck with ENT in 5 days for packing removal. Be compliant with your blood pressure medicine.    Rolland Porter, MD 01/07/14 2234

## 2014-01-07 NOTE — ED Notes (Signed)
Pt reports multiple nosebleeds for two days.  Pt reports sneezing a lot.  [facility].  Reports nosebleeding started about 45 minutes ago.  Dx with bronchitis today.

## 2014-01-07 NOTE — Discharge Instructions (Signed)
The packing in your nose needs to stay in for 5 days. Call Dr. Kelli ChurnShumaker of ENT to make an appointment to have it removed. Take your blood pressure medicine as it is prescribed.  Hypertension As your heart beats, it forces blood through your arteries. This force is your blood pressure. If the pressure is too high, it is called hypertension (HTN) or high blood pressure. HTN is dangerous because you may have it and not know it. High blood pressure may mean that your heart has to work harder to pump blood. Your arteries may be narrow or stiff. The extra work puts you at risk for heart disease, stroke, and other problems.  Blood pressure consists of two numbers, a higher number over a lower, 110/72, for example. It is stated as "110 over 72." The ideal is below 120 for the top number (systolic) and under 80 for the bottom (diastolic). Write down your blood pressure today. You should pay close attention to your blood pressure if you have certain conditions such as:  Heart failure.  Prior heart attack.  Diabetes  Chronic kidney disease.  Prior stroke.  Multiple risk factors for heart disease. To see if you have HTN, your blood pressure should be measured while you are seated with your arm held at the level of the heart. It should be measured at least twice. A one-time elevated blood pressure reading (especially in the Emergency Department) does not mean that you need treatment. There may be conditions in which the blood pressure is different between your right and left arms. It is important to see your caregiver soon for a recheck. Most people have essential hypertension which means that there is not a specific cause. This type of high blood pressure may be lowered by changing lifestyle factors such as:  Stress.  Smoking.  Lack of exercise.  Excessive weight.  Drug/tobacco/alcohol use.  Eating less salt. Most people do not have symptoms from high blood pressure until it has caused damage  to the body. Effective treatment can often prevent, delay or reduce that damage. TREATMENT  When a cause has been identified, treatment for high blood pressure is directed at the cause. There are a large number of medications to treat HTN. These fall into several categories, and your caregiver will help you select the medicines that are best for you. Medications may have side effects. You should review side effects with your caregiver. If your blood pressure stays high after you have made lifestyle changes or started on medicines,   Your medication(s) may need to be changed.  Other problems may need to be addressed.  Be certain you understand your prescriptions, and know how and when to take your medicine.  Be sure to follow up with your caregiver within the time frame advised (usually within two weeks) to have your blood pressure rechecked and to review your medications.  If you are taking more than one medicine to lower your blood pressure, make sure you know how and at what times they should be taken. Taking two medicines at the same time can result in blood pressure that is too low. SEEK IMMEDIATE MEDICAL CARE IF:  You develop a severe headache, blurred or changing vision, or confusion.  You have unusual weakness or numbness, or a faint feeling.  You have severe chest or abdominal pain, vomiting, or breathing problems. MAKE SURE YOU:   Understand these instructions.  Will watch your condition.  Will get help right away if you are not doing  well or get worse. Document Released: 11/15/2005 Document Revised: 02/07/2012 Document Reviewed: 07/05/2008 [facility] Patient Information 2014 [place], [place].  Nosebleed Nosebleeds can be caused by many conditions including trauma, infections, polyps, foreign bodies, dry mucous membranes or climate, medications and air conditioning. Most nosebleeds occur in the front of the nose. It is because of this location that most nosebleeds can be  controlled by pinching the nostrils gently and continuously. Do this for at least 10 to 20 minutes. The reason for this long continuous pressure is that you must hold it long enough for the blood to clot. If during that 10 to 20 minute time period, pressure is released, the process may have to be started again. The nosebleed may stop by itself, quit with pressure, need concentrated heating (cautery) or stop with pressure from packing. HOME CARE INSTRUCTIONS   If your nose was packed, try to maintain the pack inside until your caregiver removes it. If a gauze pack was used and it starts to fall out, gently replace or cut the end off. Do not cut if a balloon catheter was used to pack the nose. Otherwise, do not remove unless instructed.  Avoid blowing your nose for 12 hours after treatment. This could dislodge the pack or clot and start bleeding again.  If the bleeding starts again, sit up and bending forward, gently pinch the front half of your nose continuously for 20 minutes.  If bleeding was caused by dry mucous membranes, cover the inside of your nose every morning with a petroleum or antibiotic ointment. Use your little fingertip as an applicator. Do this as needed during dry weather. This will keep the mucous membranes moist and allow them to heal.  Maintain humidity in your home by using less air conditioning or using a humidifier.  Do not use aspirin or medications which make bleeding more likely. Your caregiver can give you recommendations on this.  Resume normal activities as able but try to avoid straining, lifting or bending at the waist for several days.  If the nosebleeds become recurrent and the cause is unknown, your caregiver may suggest laboratory tests. SEEK IMMEDIATE MEDICAL CARE IF:   Bleeding recurs and cannot be controlled.  There is unusual bleeding from or bruising on other parts of the body.  You have a fever.  Nosebleeds continue.  There is any worsening of the  condition which originally brought you in.  You become lightheaded, feel faint, become sweaty or vomit blood. MAKE SURE YOU:   Understand these instructions.  Will watch your condition.  Will get help right away if you are not doing well or get worse. Document Released: 08/25/2005 Document Revised: 02/07/2012 Document Reviewed: 10/17/2009 [facility] Patient Information 2014 [place], [place].

## 2014-01-07 NOTE — ED Notes (Signed)
MD at bedside. 

## 2014-01-08 ENCOUNTER — Ambulatory Visit (INDEPENDENT_AMBULATORY_CARE_PROVIDER_SITE_OTHER): Payer: Medicare Other | Admitting: Emergency Medicine

## 2014-01-08 VITALS — BP 160/82 | HR 74 | Temp 97.6°F | Resp 18 | Ht 70.0 in | Wt 252.0 lb

## 2014-01-08 DIAGNOSIS — J209 Acute bronchitis, unspecified: Secondary | ICD-10-CM | POA: Diagnosis not present

## 2014-01-08 DIAGNOSIS — R040 Epistaxis: Secondary | ICD-10-CM | POA: Diagnosis not present

## 2014-01-08 MED ORDER — OXYCODONE-ACETAMINOPHEN 5-325 MG PO TABS: 1.0000 | ORAL_TABLET | ORAL | Status: DC | PRN

## 2014-01-08 MED ORDER — AMOXICILLIN-POT CLAVULANATE 875-125 MG PO TABS: 1.0000 | ORAL_TABLET | Freq: Two times a day (BID) | ORAL | Status: DC

## 2014-01-08 NOTE — Progress Notes (Signed)
[facility] [address], [place] [address] [phone]  Date:  01/08/2014   Name:  [name]   DOB:  14-Mar-1946   MRN:  [id number]  PCP:  Lesleigh NoeSMITH III,HENRY W, MD    Chief Complaint: Epistaxis and Discuss Medication   History of Present Illness:  [name] is a 68 y.o. very pleasant male patient who presents with the following:  Ill with cough and wheezing.  [facility] and treated with zpak and albuterol.  Had right nostril packed for epistaxis.  Having trouble with pain from the packing.  Now says his cough and wheezing are improved.  No fever, chills, shortness of breath or wheezing.  No improvement with over the counter medications or other home remedies..jd   There are no active problems to display for this patient.   Past Medical History  Diagnosis Date  . Hypertension   . Coronary artery disease   . High cholesterol   . Anxiety   . Panic attacks   . Anginal pain 01/2005  . History of bronchitis     "used to get it alot" (08/25/2012)  . Arthritis     "knees"  . Chronic lower back pain   . PTSD (post-traumatic stress disorder)     "have been treated in the past" (08/25/2012)  . Depression     Past Surgical History  Procedure Laterality Date  . Coronary artery bypass graft  01/2005    CABG X3  . Cardiac catheterization  01/2005  . Varicose vein surgery  1980's    LLE  . Shrapnel  1969    LLE; left lateral thumb (required grafting)    History  Substance Use Topics  . Smoking status: Former Smoker -- 2.00 packs/day for 5 years  . Smokeless tobacco: Never Used     Comment: 08/25/2012 "quit smoking cigarettes 40 yr ago"  . Alcohol Use: No    Family History  Problem Relation Age of Onset  . Diabetes Mother   . Heart disease Father   . Cancer Brother     Allergies  Allergen Reactions  . Codeine Palpitations  . Vicodin [Hydrocodone-Acetaminophen] Palpitations    Medication list has been reviewed and  updated.  Current Outpatient Prescriptions on File Prior to Visit  Medication Sig Dispense Refill  . albuterol (PROVENTIL HFA;VENTOLIN HFA) 108 (90 BASE) MCG/ACT inhaler Inhale into the lungs every 6 (six) hours as needed for wheezing or shortness of breath.      . ALPRAZolam (XANAX) 0.5 MG tablet Take 1 tablet (0.5 mg total) by mouth 3 (three) times daily as needed for anxiety.  90 tablet  2  . aspirin EC 325 MG tablet Take 325 mg by mouth every morning.      Marland Kitchen. azithromycin (ZITHROMAX) 250 MG tablet Take by mouth daily.      Marland Kitchen. lisinopril (PRINIVIL,ZESTRIL) 10 MG tablet Take 1 tablet (10 mg total) by mouth daily.  30 tablet  0  . metoprolol succinate (TOPROL-XL) 25 MG 24 hr tablet Take 25 mg by mouth daily.        . sildenafil (VIAGRA) 100 MG tablet Take 100 mg by mouth daily as needed for erectile dysfunction.      Marland Kitchen. acetaminophen (TYLENOL) 325 MG tablet Take 650 mg by mouth every 6 (six) hours as needed for pain.      . diazepam (VALIUM) 5 MG tablet Take 1 tablet (5 mg total) by mouth at  bedtime as needed for anxiety or sleep (spasms).  20 tablet  0  . ibuprofen (ADVIL,MOTRIN) 600 MG tablet Take 600 mg by mouth every 6 (six) hours as needed for pain.      . nitroGLYCERIN (NITROSTAT) 0.4 MG SL tablet Place 0.4 mg under the tongue every 5 (five) minutes x 3 doses as needed. For chest pain       No current facility-administered medications on file prior to visit.    Review of Systems:  As per HPI, otherwise negative.    Physical Examination: Filed Vitals:   01/08/14 1038  BP: 160/82  Pulse: 74  Temp: 97.6 F (36.4 C)  Resp: 18   Filed Vitals:   01/08/14 1038  Height: 5\' 10"  (1.778 m)  Weight: 252 lb (114.306 kg)   Body mass index is 36.16 kg/(m^2). Ideal Body Weight: Weight in (lb) to have BMI = 25: 173.9  GEN: WDWN, NAD, Non-toxic, A & O x 3 HEENT: Atraumatic, Normocephalic. Neck supple. No masses, No LAD. Ears and Nose: No external deformity.  Right anterior packing CV:  RRR, No M/G/R. No JVD. No thrill. No extra heart sounds. PULM: CTA B, no wheezes, crackles, rhonchi. No retractions. No resp. distress. No accessory muscle use. ABD: S, NT, ND, +BS. No rebound. No HSM. EXTR: No c/c/e NEURO Normal gait.  PSYCH: Normally interactive. Conversant. Not depressed or anxious appearing.  Calm demeanor.    Assessment and Plan: Right nasal pack augmentin Percocet Follow up Thursday   Signed,  Phillips Odor, MD

## 2014-01-08 NOTE — Patient Instructions (Signed)

## 2014-01-10 ENCOUNTER — Emergency Department (HOSPITAL_COMMUNITY)
Admission: EM | Admit: 2014-01-10 | Discharge: 2014-01-11 | Disposition: A | Discharge status: Home / Self Care | Payer: Medicare Other | Attending: Emergency Medicine | Admitting: Emergency Medicine

## 2014-01-10 ENCOUNTER — Encounter (HOSPITAL_COMMUNITY): Payer: Self-pay | Admitting: Emergency Medicine

## 2014-01-10 ENCOUNTER — Emergency Department (HOSPITAL_COMMUNITY)
Admission: EM | Admit: 2014-01-10 | Discharge: 2014-01-10 | Disposition: A | Discharge status: Home / Self Care | Payer: Medicare Other | Source: Home / Self Care | Attending: Emergency Medicine | Admitting: Emergency Medicine

## 2014-01-10 DIAGNOSIS — Z4801 Encounter for change or removal of surgical wound dressing: Secondary | ICD-10-CM

## 2014-01-10 DIAGNOSIS — F329 Major depressive disorder, single episode, unspecified: Secondary | ICD-10-CM

## 2014-01-10 DIAGNOSIS — Z951 Presence of aortocoronary bypass graft: Secondary | ICD-10-CM | POA: Insufficient documentation

## 2014-01-10 DIAGNOSIS — I2510 Atherosclerotic heart disease of native coronary artery without angina pectoris: Secondary | ICD-10-CM | POA: Insufficient documentation

## 2014-01-10 DIAGNOSIS — Z79899 Other long term (current) drug therapy: Secondary | ICD-10-CM | POA: Insufficient documentation

## 2014-01-10 DIAGNOSIS — F4310 Post-traumatic stress disorder, unspecified: Secondary | ICD-10-CM

## 2014-01-10 DIAGNOSIS — J9819 Other pulmonary collapse: Secondary | ICD-10-CM | POA: Diagnosis not present

## 2014-01-10 DIAGNOSIS — Z87891 Personal history of nicotine dependence: Secondary | ICD-10-CM | POA: Insufficient documentation

## 2014-01-10 DIAGNOSIS — Z792 Long term (current) use of antibiotics: Secondary | ICD-10-CM

## 2014-01-10 DIAGNOSIS — M1710 Unilateral primary osteoarthritis, unspecified knee: Secondary | ICD-10-CM | POA: Insufficient documentation

## 2014-01-10 DIAGNOSIS — Z9889 Other specified postprocedural states: Secondary | ICD-10-CM | POA: Insufficient documentation

## 2014-01-10 DIAGNOSIS — IMO0001 Reserved for inherently not codable concepts without codable children: Secondary | ICD-10-CM

## 2014-01-10 DIAGNOSIS — Z8639 Personal history of other endocrine, nutritional and metabolic disease: Secondary | ICD-10-CM | POA: Insufficient documentation

## 2014-01-10 DIAGNOSIS — Z8709 Personal history of other diseases of the respiratory system: Secondary | ICD-10-CM

## 2014-01-10 DIAGNOSIS — F3289 Other specified depressive episodes: Secondary | ICD-10-CM | POA: Diagnosis not present

## 2014-01-10 DIAGNOSIS — Z862 Personal history of diseases of the blood and blood-forming organs and certain disorders involving the immune mechanism: Secondary | ICD-10-CM | POA: Insufficient documentation

## 2014-01-10 DIAGNOSIS — Z7982 Long term (current) use of aspirin: Secondary | ICD-10-CM | POA: Insufficient documentation

## 2014-01-10 DIAGNOSIS — G8929 Other chronic pain: Secondary | ICD-10-CM | POA: Insufficient documentation

## 2014-01-10 DIAGNOSIS — R0602 Shortness of breath: Secondary | ICD-10-CM | POA: Insufficient documentation

## 2014-01-10 DIAGNOSIS — R51 Headache: Secondary | ICD-10-CM | POA: Insufficient documentation

## 2014-01-10 DIAGNOSIS — G8918 Other acute postprocedural pain: Secondary | ICD-10-CM | POA: Insufficient documentation

## 2014-01-10 DIAGNOSIS — IMO0002 Reserved for concepts with insufficient information to code with codable children: Secondary | ICD-10-CM

## 2014-01-10 DIAGNOSIS — J3489 Other specified disorders of nose and nasal sinuses: Secondary | ICD-10-CM | POA: Insufficient documentation

## 2014-01-10 DIAGNOSIS — I10 Essential (primary) hypertension: Secondary | ICD-10-CM

## 2014-01-10 DIAGNOSIS — F411 Generalized anxiety disorder: Secondary | ICD-10-CM | POA: Insufficient documentation

## 2014-01-10 DIAGNOSIS — F410 Panic disorder [episodic paroxysmal anxiety] without agoraphobia: Secondary | ICD-10-CM | POA: Insufficient documentation

## 2014-01-10 DIAGNOSIS — Z4800 Encounter for change or removal of nonsurgical wound dressing: Secondary | ICD-10-CM

## 2014-01-10 DIAGNOSIS — R0981 Nasal congestion: Secondary | ICD-10-CM

## 2014-01-10 NOTE — ED Notes (Signed)
Pt ambulated independently, SpO2 stayed at 99% and higher throughout.

## 2014-01-10 NOTE — ED Notes (Signed)
Pt. arrived with EMS from home woke up this morning 4 am with SOB , anxious , currently taking antibiotic ( Z-pack ) for URI , right nare packed due to previous epistaxis due for removal today  , denies chest pain . Pt. calm at arrival / respirations unlabored .

## 2014-01-10 NOTE — Discharge Instructions (Signed)
Shortness of Breath  Shortness of breath means you have trouble breathing. Shortness of breath needs medical care right away.  HOME CARE   · Do not smoke.  · Avoid being around chemicals or things (paint fumes, dust) that may bother your breathing.  · Rest as needed. Slowly begin your normal activities.  · Only take medicines as told by your doctor.  · Keep all doctor visits as told.  GET HELP RIGHT AWAY IF:   · Your shortness of breath gets worse.  · You feel lightheaded, pass out (faint), or have a cough that is not helped by medicine.  · You cough up blood.  · You have pain with breathing.  · You have pain in your chest, arms, shoulders, or belly (abdomen).  · You have a fever.  · You cannot walk up stairs or exercise the way you normally do.  · You do not get better in the time expected.  · You have a hard time doing normal activities even with rest.  · You have problems with your medicines.  · You have any new symptoms.  MAKE SURE YOU:  · Understand these instructions.  · Will watch your condition.  · Will get help right away if you are not doing well or get worse.  Document Released: 05/03/2008 Document Revised: 05/16/2012 Document Reviewed: 01/31/2012  [employer]® Patient Information ©2014 [employer].

## 2014-01-10 NOTE — ED Notes (Addendum)
PT has hx of URI and epitaxis. States he woke up in middle of night and felt like he couldn't catch breathe due to drainage. [facility] for same thing and treated for bronchitis and discharged. Has been on antibiotics for sinus infection; has one day of z pack left.

## 2014-01-10 NOTE — ED Provider Notes (Signed)
68 year old male came in complaining of dyspnea. His right nostril has been packed with gauze to treat epistaxis. His lungs are clear. Upon removal of the packing, he states that his breathing is back to normal. He is discharged with reassurance.  Medical screening examination/treatment/procedure(s) were conducted as a shared visit with non-physician practitioner(s) and myself.  I personally evaluated the patient during the encounter.   Dione Boozeavid Leyland Kenna, MD 01/10/14 [phone]

## 2014-01-10 NOTE — ED Notes (Signed)
Patient ambulated 100 feet, pts O2 sats increased from 94% to 97%

## 2014-01-10 NOTE — ED Provider Notes (Signed)
CSN: [id number]     Arrival date & time 01/10/14  2227 History   First MD Initiated Contact with Patient 01/10/14 2331     Chief Complaint  Patient presents with  . Nasal Congestion     (Consider location/radiation/quality/duration/timing/severity/associated sxs/prior Treatment) HPI Comments: Patient has recently and continues to be treated for URI symptoms He was placed on Azithromycin as a precaution after a nose bleed and packing placed  This packing was removed this morning.  This evening when he was in bed sleeping he was awakened by a feeling of not being able to catch his breath.  He is addiment that there is something wrong with his lungs.  He does not accept the explanation of "post nasal drip" stating it is in his lungs. Denies Hx of sleep apnea   The history is provided by the patient.    Past Medical History  Diagnosis Date  . Hypertension   . Coronary artery disease   . High cholesterol   . Anxiety   . Panic attacks   . Anginal pain 01/2005  . History of bronchitis     "used to get it alot" (08/25/2012)  . Arthritis     "knees"  . Chronic lower back pain   . PTSD (post-traumatic stress disorder)     "have been treated in the past" (08/25/2012)  . Depression    Past Surgical History  Procedure Laterality Date  . Coronary artery bypass graft  01/2005    CABG X3  . Cardiac catheterization  01/2005  . Varicose vein surgery  1980's    LLE  . Shrapnel  1969    LLE; left lateral thumb (required grafting)   Family History  Problem Relation Age of Onset  . Diabetes Mother   . Heart disease Father   . Cancer Brother    History  Substance Use Topics  . Smoking status: Former Smoker -- 2.00 packs/day for 5 years  . Smokeless tobacco: Never Used     Comment: 08/25/2012 "quit smoking cigarettes 40 yr ago"  . Alcohol Use: No    Review of Systems  Constitutional: Negative for fever and chills.  HENT: Positive for postnasal drip and rhinorrhea. Negative for sore  throat and trouble swallowing.   Respiratory: Positive for shortness of breath. Negative for cough and wheezing.   Gastrointestinal: Negative for nausea.  Psychiatric/Behavioral: The patient is nervous/anxious.   All other systems reviewed and are negative.      Allergies  Codeine and Vicodin  Home Medications   Current Outpatient Rx  Name  Route  Sig  Dispense  Refill  . albuterol (PROVENTIL HFA;VENTOLIN HFA) 108 (90 BASE) MCG/ACT inhaler   Inhalation   Inhale into the lungs every 6 (six) hours as needed for wheezing or shortness of breath.         . ALPRAZolam (XANAX) 0.5 MG tablet   Oral   Take 1 tablet (0.5 mg total) by mouth 3 (three) times daily as needed for anxiety.   90 tablet   2   . azithromycin (ZITHROMAX) 250 MG tablet   Oral   Take 250 mg by mouth daily.         Marland Kitchen. lisinopril (PRINIVIL,ZESTRIL) 10 MG tablet   Oral   Take 1 tablet (10 mg total) by mouth daily.   30 tablet   0   . metoprolol succinate (TOPROL-XL) 25 MG 24 hr tablet   Oral   Take 25 mg by mouth daily.           .Marland Kitchen  nitroGLYCERIN (NITROSTAT) 0.4 MG SL tablet   Sublingual   Place 0.4 mg under the tongue every 5 (five) minutes x 3 doses as needed. For chest pain         . oxyCODONE-acetaminophen (ROXICET) 5-325 MG per tablet   Oral   Take 1 tablet by mouth every 4 (four) hours as needed for severe pain.   20 tablet   0   . sildenafil (VIAGRA) 100 MG tablet   Oral   Take 100 mg by mouth daily as needed for erectile dysfunction.          BP 158/67  Pulse 61  Temp(Src) 98.1 F (36.7 C) (Oral)  Resp 18  SpO2 99% Physical Exam  Nursing note and vitals reviewed. Constitutional: He is oriented to person, place, and time. He appears well-developed and well-nourished.  HENT:  Head: Normocephalic.  Nose: Nose normal. No mucosal edema, rhinorrhea, sinus tenderness, nasal deformity or septal deviation. No epistaxis. Right sinus exhibits no maxillary sinus tenderness and no frontal  sinus tenderness. Left sinus exhibits no maxillary sinus tenderness and no frontal sinus tenderness.  Eyes: Pupils are equal, round, and reactive to light.  Neck: Normal range of motion.  Cardiovascular: Normal rate and regular rhythm.   Pulmonary/Chest: Effort normal and breath sounds normal.  Abdominal: Soft. He exhibits no distension.  obese  Musculoskeletal: Normal range of motion.  Lymphadenopathy:    He has no cervical adenopathy.  Neurological: He is alert and oriented to person, place, and time.  Skin: Skin is warm.    ED Course  Procedures (including critical care time) Labs Review Labs Reviewed  D-DIMER, QUANTITATIVE - Abnormal; Notable for the following:    D-Dimer, Quant 1.98 (*)    All other components within normal limits  POCT I-STAT, CHEM 8 - Abnormal; Notable for the following:    Glucose, Bld 106 (*)    Hemoglobin 18.0 (*)    HCT 53.0 (*)    All other components within normal limits   Imaging Review Dg Chest 2 View  01/11/2014   CLINICAL DATA:  Sinus drainage.  Cough.  EXAM: CHEST  2 VIEW  COMPARISON:  DG CHEST 1V PORT dated 02/11/2013; DG CHEST 2 VIEW dated 08/25/2012  FINDINGS: Prominent pericardial fat pad. Postoperative changes of CABG/median sternotomy. Basilar atelectasis. No airspace disease. No pleural effusion. Mediastinal contours are unchanged. Scarring present over the anterior cardiopericardial silhouette on the lateral view, chronic.  IMPRESSION: No interval change or acute cardiopulmonary disease.   Electronically Signed   By: Andreas Newport M.D.   On: 01/11/2014 01:00   Ct Angio Chest Pe W/cm &/or Wo Cm  01/11/2014   CLINICAL DATA:  Short of breath. Anxious. Pulmonary embolism. Positive D-dimer.  EXAM: CT ANGIOGRAPHY CHEST WITH CONTRAST  TECHNIQUE: Multidetector CT imaging of the chest was performed using the standard protocol during bolus administration of intravenous contrast. Multiplanar CT image reconstructions and MIPs were obtained to evaluate  the vascular anatomy.  CONTRAST:  OMNIPAQUE IOHEXOL 350 MG/ML SOLN  COMPARISON:  DG CHEST 2 VIEW dated 01/11/2014; CT ANGIO CHEST W/CM &/OR WO/CM dated 08/26/2012; DG CHEST 2 VIEW dated 08/25/2012  FINDINGS: Technically adequate study without pulmonary embolism. Postoperative changes of CABG. No pericardial or pleural effusion. Marked dependent atelectasis is present in the lungs. No focal airspace consolidation. Aneurysmal dilation of the ascending aorta measuring 42 mm. Aortic and branch vessel atherosclerosis. There is no axillary adenopathy. No mediastinal or hilar adenopathy. Incidental imaging of the upper abdomen  demonstrates old granulomatous disease of the liver. Bones appear within normal limits.  Review of the MIP images confirms the above findings.  IMPRESSION: 1. Negative for pulmonary embolus. 2. Unchanged aneurysmal dilation of the ascending aorta measuring 42 mm. No acute aortic abnormality. 3. Low lung volumes with atelectasis. 4. CABG/median sternotomy.   Electronically Signed   By: Andreas Newport M.D.   On: 01/11/2014 04:17    EKG Interpretation   None       MDM   Final diagnoses:  Nasal congestion  Shortness of breath dyspnea  Patient is very concerned that he is not getting enough oxygen He was ambulated in the hall with monitor on  Was 100% sat the whole time with a pulse of 76-80 He was speaking thought the whole event    Patient's chest x-ray was normal, but his d-dimer was elevated.  He had a chest CT angiogram to rule out pulmonary embolus, which is negative.  I recommend that he followup with his primary care physician and perhaps discuss sleep apnea.  Studies    Arman Filter, NP 01/11/14 2000

## 2014-01-10 NOTE — ED Provider Notes (Signed)
CSN: [id number]     Arrival date & time 01/10/14  1191 History   First MD Initiated Contact with Patient 01/10/14 0606     Chief Complaint  Patient presents with  . Shortness of Breath     (Consider location/radiation/quality/duration/timing/severity/associated sxs/prior Treatment) HPI Comments: Patient is a 68 year old male with history of hypertension, coronary artery disease, anxiety, depression who presents today with shortness of breath. He reports that on 2/9 he was evaluated at both [facility] and [facility] for bronchitis and epistaxis. On 2/9 he was given a Z-Pak and had his right nare packed do to nosebleed. Since that time he has been having severe pain in his right face from the packing. He has taken Percocet with mild relief. He was seen by his regular doctor yesterday, Dr. Thornton Papas who recommended Augmentin and packing removal today. Today the patient states that he felt short of breath upon awakening. He is unsure if this is to take the Percocet or azithromycin. He reports that his cough has improved with the medication. He does not smoke cigarettes. No fevers, chills, nausea, vomiting, abdominal pain.   The history is provided by the patient. No language interpreter was used.    Past Medical History  Diagnosis Date  . Hypertension   . Coronary artery disease   . High cholesterol   . Anxiety   . Panic attacks   . Anginal pain 01/2005  . History of bronchitis     "used to get it alot" (08/25/2012)  . Arthritis     "knees"  . Chronic lower back pain   . PTSD (post-traumatic stress disorder)     "have been treated in the past" (08/25/2012)  . Depression    Past Surgical History  Procedure Laterality Date  . Coronary artery bypass graft  01/2005    CABG X3  . Cardiac catheterization  01/2005  . Varicose vein surgery  1980's    LLE  . Shrapnel  1969    LLE; left lateral thumb (required grafting)   Family History  Problem Relation Age of Onset  .  Diabetes Mother   . Heart disease Father   . Cancer Brother    History  Substance Use Topics  . Smoking status: Former Smoker -- 2.00 packs/day for 5 years  . Smokeless tobacco: Never Used     Comment: 08/25/2012 "quit smoking cigarettes 40 yr ago"  . Alcohol Use: No    Review of Systems  Constitutional: Negative for fever and chills.  HENT:       Nasal packing  Respiratory: Positive for shortness of breath. Negative for cough.   Gastrointestinal: Negative for nausea, vomiting and abdominal pain.  All other systems reviewed and are negative.      Allergies  Codeine and Vicodin  Home Medications   Current Outpatient Rx  Name  Route  Sig  Dispense  Refill  . acetaminophen (TYLENOL) 325 MG tablet   Oral   Take 650 mg by mouth every 6 (six) hours as needed for pain.         Marland Kitchen albuterol (PROVENTIL HFA;VENTOLIN HFA) 108 (90 BASE) MCG/ACT inhaler   Inhalation   Inhale into the lungs every 6 (six) hours as needed for wheezing or shortness of breath.         . ALPRAZolam (XANAX) 0.5 MG tablet   Oral   Take 1 tablet (0.5 mg total) by mouth 3 (three) times daily as needed for anxiety.   90  tablet   2   . amoxicillin-clavulanate (AUGMENTIN) 875-125 MG per tablet   Oral   Take 1 tablet by mouth 2 (two) times daily.   20 tablet   0   . aspirin EC 325 MG tablet   Oral   Take 325 mg by mouth every morning.         Marland Kitchen. azithromycin (ZITHROMAX) 250 MG tablet   Oral   Take by mouth daily.         . diazepam (VALIUM) 5 MG tablet   Oral   Take 1 tablet (5 mg total) by mouth at bedtime as needed for anxiety or sleep (spasms).   20 tablet   0   . ibuprofen (ADVIL,MOTRIN) 600 MG tablet   Oral   Take 600 mg by mouth every 6 (six) hours as needed for pain.         Marland Kitchen. lisinopril (PRINIVIL,ZESTRIL) 10 MG tablet   Oral   Take 1 tablet (10 mg total) by mouth daily.   30 tablet   0   . metoprolol succinate (TOPROL-XL) 25 MG 24 hr tablet   Oral   Take 25 mg by mouth  daily.           . nitroGLYCERIN (NITROSTAT) 0.4 MG SL tablet   Sublingual   Place 0.4 mg under the tongue every 5 (five) minutes x 3 doses as needed. For chest pain         . oxyCODONE-acetaminophen (ROXICET) 5-325 MG per tablet   Oral   Take 1 tablet by mouth every 4 (four) hours as needed for severe pain.   20 tablet   0   . sildenafil (VIAGRA) 100 MG tablet   Oral   Take 100 mg by mouth daily as needed for erectile dysfunction.          BP 172/90  Pulse 81  Temp(Src) 97.6 F (36.4 C) (Oral)  Resp 14  Ht 5\' 10"  (1.778 m)  Wt 250 lb (113.399 kg)  BMI 35.87 kg/m2  SpO2 96% Physical Exam  Nursing note and vitals reviewed. Constitutional: He is oriented to person, place, and time. He appears well-developed and well-nourished. No distress.  HENT:  Head: Normocephalic and atraumatic.  Right Ear: External ear normal.  Left Ear: External ear normal.  Nose: Nose normal.  Eyes: Conjunctivae are normal.  Neck: Normal range of motion. No tracheal deviation present.  Packing in place in the right nare  Cardiovascular: Normal rate, regular rhythm and normal heart sounds.   Pulmonary/Chest: Effort normal and breath sounds normal. No stridor.  Abdominal: Soft. He exhibits no distension. There is no tenderness.  Musculoskeletal: Normal range of motion.  Neurological: He is alert and oriented to person, place, and time.  Skin: Skin is warm and dry. He is not diaphoretic.  Psychiatric: He has a normal mood and affect. His behavior is normal.    ED Course  Procedures (including critical care time) Labs Review Labs Reviewed - No data to display Imaging Review No results found.  EKG Interpretation   None      0630: I removed the packing with forceps without complication. Patient tolerated well. Verbal consent was obtained. Patient was confirmed verbally and by armband.   MDM   Final diagnoses:  Encounter for removal of nasal packing  Shortness of breath   Patient  presents to ED for nasal packing removal. His shortness of breath feels significantly improved after packing is removed. No signs of infection. Lungs  clear. Pt ambulated in ED with oxygen saturations at from 94-97% on RA. ENT referral given. Dr. Preston Fleeting evaluated patient and agrees with plan. Return instructions given. Vital signs stable for discharge. Patient / Family / Caregiver informed of clinical course, understand medical decision-making process, and agree with plan.     Mora Bellman, PA-C 01/10/14 [id number]

## 2014-01-11 ENCOUNTER — Telehealth: Payer: Self-pay

## 2014-01-11 ENCOUNTER — Emergency Department (HOSPITAL_COMMUNITY): Payer: Medicare Other

## 2014-01-11 ENCOUNTER — Encounter (HOSPITAL_COMMUNITY): Payer: Self-pay | Admitting: Radiology

## 2014-01-11 DIAGNOSIS — J9819 Other pulmonary collapse: Secondary | ICD-10-CM | POA: Diagnosis not present

## 2014-01-11 LAB — POCT I-STAT, CHEM 8
BUN: 15 mg/dL (ref 6–23)
CALCIUM ION: 1.19 mmol/L (ref 1.13–1.30)
CREATININE: 1 mg/dL (ref 0.50–1.35)
Chloride: 101 mEq/L (ref 96–112)
Glucose, Bld: 106 mg/dL — ABNORMAL HIGH (ref 70–99)
HCT: 53 % — ABNORMAL HIGH (ref 39.0–52.0)
Hemoglobin: 18 g/dL — ABNORMAL HIGH (ref 13.0–17.0)
Potassium: 4.3 mEq/L (ref 3.7–5.3)
SODIUM: 140 meq/L (ref 137–147)
TCO2: 27 mmol/L (ref 0–100)

## 2014-01-11 LAB — D-DIMER, QUANTITATIVE (NOT AT ARMC): D-Dimer, Quant: 1.98 ug/mL-FEU — ABNORMAL HIGH (ref 0.00–0.48)

## 2014-01-11 MED ORDER — IOHEXOL 350 MG/ML SOLN
100.0000 mL | Freq: Once | INTRAVENOUS | Status: AC | PRN
  Administered 2014-01-11: 100 mL via INTRAVENOUS

## 2014-01-11 NOTE — ED Notes (Signed)
Patient transported to CT, returned from scan and requesting fluids, checking with md regarding same

## 2014-01-11 NOTE — ED Provider Notes (Signed)
Medical screening examination/treatment/procedure(s) were performed by non-physician practitioner and as supervising physician I was immediately available for consultation/collaboration.    Aloha Bartok, MD 01/11/14 2326 

## 2014-01-11 NOTE — ED Notes (Signed)
Contacted lab about D-dimer results, spoke with [place] and was told that it would be approximately 20-25 minutes for results.

## 2014-01-11 NOTE — Telephone Encounter (Signed)
FYI Dr. Dareen PianoAnderson. Do you want pt to come in for a follow up?

## 2014-01-11 NOTE — Discharge Instructions (Signed)
Today you had a chest x-ray, which was normal.  He also had a CT scan of your chest, which did not show any pulmonary embolus.  Please followup with Dr. Dareen PianoAnderson today.  Please discuss your symptoms, and perhaps, a sleep study to rule out sleep apnea, would be advantageous to your overall health

## 2014-01-11 NOTE — Telephone Encounter (Signed)
DR.ANDERSON, PT STATES THAT HE MISSED HIS APPT WITH YOU DUE TO HIM BEING IN THE HOSPITAL FOR HIS BLOOD PRESSURE, HE ALSO WANTED ME TO LET YOU KNOW THAT THEY REMOVED THE PACKING THAT YOU PLACED IN HIS NOSE FOR HIS NOSE BLEEDS.  BEST# [id number]

## 2014-01-11 NOTE — ED Notes (Signed)
Contacted lab to check on d-dimer results, was notified that it would be aproximattly 20 minutes.

## 2014-02-17 ENCOUNTER — Emergency Department (HOSPITAL_COMMUNITY): Payer: Medicare Other

## 2014-02-17 ENCOUNTER — Encounter (HOSPITAL_COMMUNITY): Payer: Self-pay | Admitting: Emergency Medicine

## 2014-02-17 ENCOUNTER — Emergency Department (HOSPITAL_COMMUNITY)
Admission: EM | Admit: 2014-02-17 | Discharge: 2014-02-17 | Disposition: A | Discharge status: Home / Self Care | Payer: Medicare Other | Attending: Emergency Medicine | Admitting: Emergency Medicine

## 2014-02-17 DIAGNOSIS — S199XXA Unspecified injury of neck, initial encounter: Secondary | ICD-10-CM | POA: Diagnosis not present

## 2014-02-17 DIAGNOSIS — M542 Cervicalgia: Secondary | ICD-10-CM | POA: Diagnosis not present

## 2014-02-17 DIAGNOSIS — Z87891 Personal history of nicotine dependence: Secondary | ICD-10-CM | POA: Insufficient documentation

## 2014-02-17 DIAGNOSIS — Y92410 Unspecified street and highway as the place of occurrence of the external cause: Secondary | ICD-10-CM | POA: Insufficient documentation

## 2014-02-17 DIAGNOSIS — S060XAA Concussion with loss of consciousness status unknown, initial encounter: Secondary | ICD-10-CM

## 2014-02-17 DIAGNOSIS — Z862 Personal history of diseases of the blood and blood-forming organs and certain disorders involving the immune mechanism: Secondary | ICD-10-CM | POA: Insufficient documentation

## 2014-02-17 DIAGNOSIS — R109 Unspecified abdominal pain: Secondary | ICD-10-CM | POA: Diagnosis not present

## 2014-02-17 DIAGNOSIS — S301XXA Contusion of abdominal wall, initial encounter: Secondary | ICD-10-CM | POA: Insufficient documentation

## 2014-02-17 DIAGNOSIS — F1010 Alcohol abuse, uncomplicated: Secondary | ICD-10-CM | POA: Insufficient documentation

## 2014-02-17 DIAGNOSIS — S060X0A Concussion without loss of consciousness, initial encounter: Secondary | ICD-10-CM | POA: Insufficient documentation

## 2014-02-17 DIAGNOSIS — F10929 Alcohol use, unspecified with intoxication, unspecified: Secondary | ICD-10-CM

## 2014-02-17 DIAGNOSIS — Z8709 Personal history of other diseases of the respiratory system: Secondary | ICD-10-CM | POA: Insufficient documentation

## 2014-02-17 DIAGNOSIS — T148XXA Other injury of unspecified body region, initial encounter: Secondary | ICD-10-CM | POA: Diagnosis not present

## 2014-02-17 DIAGNOSIS — F329 Major depressive disorder, single episode, unspecified: Secondary | ICD-10-CM | POA: Insufficient documentation

## 2014-02-17 DIAGNOSIS — Z9889 Other specified postprocedural states: Secondary | ICD-10-CM | POA: Insufficient documentation

## 2014-02-17 DIAGNOSIS — F410 Panic disorder [episodic paroxysmal anxiety] without agoraphobia: Secondary | ICD-10-CM | POA: Insufficient documentation

## 2014-02-17 DIAGNOSIS — G8929 Other chronic pain: Secondary | ICD-10-CM | POA: Insufficient documentation

## 2014-02-17 DIAGNOSIS — S59909A Unspecified injury of unspecified elbow, initial encounter: Secondary | ICD-10-CM | POA: Diagnosis not present

## 2014-02-17 DIAGNOSIS — Z79899 Other long term (current) drug therapy: Secondary | ICD-10-CM | POA: Insufficient documentation

## 2014-02-17 DIAGNOSIS — I10 Essential (primary) hypertension: Secondary | ICD-10-CM | POA: Insufficient documentation

## 2014-02-17 DIAGNOSIS — Y9389 Activity, other specified: Secondary | ICD-10-CM | POA: Insufficient documentation

## 2014-02-17 DIAGNOSIS — F3289 Other specified depressive episodes: Secondary | ICD-10-CM | POA: Insufficient documentation

## 2014-02-17 DIAGNOSIS — S060X9A Concussion with loss of consciousness of unspecified duration, initial encounter: Secondary | ICD-10-CM

## 2014-02-17 DIAGNOSIS — I2510 Atherosclerotic heart disease of native coronary artery without angina pectoris: Secondary | ICD-10-CM | POA: Insufficient documentation

## 2014-02-17 DIAGNOSIS — S0990XA Unspecified injury of head, initial encounter: Secondary | ICD-10-CM | POA: Diagnosis not present

## 2014-02-17 DIAGNOSIS — Z951 Presence of aortocoronary bypass graft: Secondary | ICD-10-CM | POA: Insufficient documentation

## 2014-02-17 DIAGNOSIS — M79609 Pain in unspecified limb: Secondary | ICD-10-CM | POA: Diagnosis not present

## 2014-02-17 DIAGNOSIS — Z8639 Personal history of other endocrine, nutritional and metabolic disease: Secondary | ICD-10-CM | POA: Insufficient documentation

## 2014-02-17 DIAGNOSIS — S298XXA Other specified injuries of thorax, initial encounter: Secondary | ICD-10-CM | POA: Diagnosis not present

## 2014-02-17 DIAGNOSIS — Z8739 Personal history of other diseases of the musculoskeletal system and connective tissue: Secondary | ICD-10-CM | POA: Insufficient documentation

## 2014-02-17 DIAGNOSIS — K8020 Calculus of gallbladder without cholecystitis without obstruction: Secondary | ICD-10-CM | POA: Insufficient documentation

## 2014-02-17 DIAGNOSIS — R4182 Altered mental status, unspecified: Secondary | ICD-10-CM | POA: Insufficient documentation

## 2014-02-17 DIAGNOSIS — S0993XA Unspecified injury of face, initial encounter: Secondary | ICD-10-CM | POA: Diagnosis not present

## 2014-02-17 DIAGNOSIS — S3981XA Other specified injuries of abdomen, initial encounter: Secondary | ICD-10-CM | POA: Diagnosis not present

## 2014-02-17 DIAGNOSIS — I209 Angina pectoris, unspecified: Secondary | ICD-10-CM | POA: Insufficient documentation

## 2014-02-17 DIAGNOSIS — Z792 Long term (current) use of antibiotics: Secondary | ICD-10-CM | POA: Insufficient documentation

## 2014-02-17 LAB — BASIC METABOLIC PANEL WITH GFR
BUN: 12 mg/dL (ref 6–23)
CO2: 23 meq/L (ref 19–32)
Calcium: 9.8 mg/dL (ref 8.4–10.5)
Chloride: 97 meq/L (ref 96–112)
Creatinine, Ser: 0.89 mg/dL (ref 0.50–1.35)
GFR calc Af Amer: >90 mL/min (ref >90)
GFR calc non Af Amer: 87 mL/min — ABNORMAL LOW (ref >90)
Glucose, Bld: 115 mg/dL — ABNORMAL HIGH (ref 70–99)
Potassium: 4 meq/L (ref 3.7–5.3)
Sodium: 139 meq/L (ref 137–147)

## 2014-02-17 LAB — CBC
HEMATOCRIT: 47.6 % (ref 39.0–52.0)
Hemoglobin: 16.7 g/dL (ref 13.0–17.0)
MCH: 31.7 pg (ref 26.0–34.0)
MCHC: 35.1 g/dL (ref 30.0–36.0)
MCV: 90.3 fL (ref 78.0–100.0)
Platelets: 148 10*3/uL — ABNORMAL LOW (ref 150–400)
RBC: 5.27 MIL/uL (ref 4.22–5.81)
RDW: 13 % (ref 11.5–15.5)
WBC: 7.4 10*3/uL (ref 4.0–10.5)

## 2014-02-17 LAB — ETHANOL: ALCOHOL ETHYL (B): 262 mg/dL — AB (ref 0–11)

## 2014-02-17 LAB — I-STAT CG4 LACTIC ACID, ED: Lactic Acid, Venous: 3.22 mmol/L — ABNORMAL HIGH (ref 0.5–2.2)

## 2014-02-17 MED ORDER — SODIUM CHLORIDE 0.9 % IV BOLUS (SEPSIS)
1000.0000 mL | Freq: Once | INTRAVENOUS | Status: AC
  Administered 2014-02-17: 1000 mL via INTRAVENOUS

## 2014-02-17 MED ORDER — OXYCODONE-ACETAMINOPHEN 5-325 MG PO TABS: 2.0000 | ORAL_TABLET | Freq: Four times a day (QID) | ORAL | Status: DC | PRN

## 2014-02-17 MED ORDER — ONDANSETRON HCL 4 MG/2ML IJ SOLN
4.0000 mg | Freq: Once | INTRAMUSCULAR | Status: AC
  Administered 2014-02-17: 4 mg via INTRAVENOUS
  Filled 2014-02-17: qty 2

## 2014-02-17 MED ORDER — IOHEXOL 300 MG/ML  SOLN
100.0000 mL | Freq: Once | INTRAMUSCULAR | Status: AC | PRN
  Administered 2014-02-17: 100 mL via INTRAVENOUS

## 2014-02-17 MED ORDER — OXYCODONE-ACETAMINOPHEN 5-325 MG PO TABS
2.0000 | ORAL_TABLET | Freq: Once | ORAL | Status: AC
  Administered 2014-02-17: 2 via ORAL
  Filled 2014-02-17: qty 2

## 2014-02-17 NOTE — ED Notes (Signed)
Dr Hyacinth MeekerMiller given a copy of the lactic acid results 3.22

## 2014-02-17 NOTE — ED Notes (Signed)
Pt family wanted to speak with EDP prior to discharge.

## 2014-02-17 NOTE — ED Notes (Signed)
Pt ambulated in room without difficulties. VS WNL
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
## 2014-02-17 NOTE — ED Notes (Addendum)
Patient's IV site was infiltrated on arrival to CT, and removed by another technologist.  I attempted an 18 g in the rt forearm, and had good blood return, but as I began advancing the catheter, the patient told me to "Stop, and get a phlebotomist." I told him that I was in the vein and just needed to advance it a little. Patient continued to refuse and requested a phlebotomist. Advised patient that we would get his RN, that phlebotomists do not start IV's here. Removed IV at patient's request.

## 2014-02-17 NOTE — Discharge Instructions (Signed)
You have had a head injury which does not appear to require admission at this time. A concussion is a state of changed mental ability from trauma. SEEK IMMEDIATE MEDICAL ATTENTION IF: There is confusion or drowsiness (although children frequently become drowsy after injury).  You cannot awaken the injured person.  There is nausea (feeling sick to your stomach) or continued, forceful vomiting.  You notice dizziness or unsteadiness which is getting worse, or inability to walk.  You have convulsions or unconsciousness.  You experience severe, persistent headaches not relieved by Tylenol?. (Do not take aspirin as this impairs clotting abilities). Take other pain medications only as directed.  You cannot use arms or legs normally.  There are changes in pupil sizes. (This is the black center in the colored part of the eye)  There is clear or bloody discharge from the nose or ears.  Change in speech, vision, swallowing, or understanding.  Localized weakness, numbness, tingling, or change in bowel or bladder control.  Abdominal (belly) pain can be caused by many things. Your caregiver performed an examination and possibly ordered blood/urine tests and imaging (CT scan, x-rays, ultrasound). Many cases can be observed and treated at home after initial evaluation in the emergency department. Even though you are being discharged home, abdominal pain can be unpredictable. Therefore, you need a repeated exam if your pain does not resolve, returns, or worsens. Most patients with abdominal pain don't have to be admitted to the hospital or have surgery, but serious problems like appendicitis and gallbladder attacks can start out as nonspecific pain. Many abdominal conditions cannot be diagnosed in one visit, so follow-up evaluations are very important. SEEK IMMEDIATE MEDICAL ATTENTION IF: The pain does not go away or becomes severe.  A temperature above 101 develops.  Repeated vomiting occurs (multiple episodes).    The pain becomes localized to portions of the abdomen. The right side could possibly be appendicitis. In an adult, the left lower portion of the abdomen could be colitis or diverticulitis.  Blood is being passed in stools or vomit (bright red or black tarry stools).  Return also if you develop chest pain, difficulty breathing, dizziness or fainting, or become confused, poorly responsive, or inconsolable (young children). Alcohol Intoxication Alcohol intoxication occurs when you drink enough alcohol that it affects your ability to function. It can be mild or very severe. Drinking a lot of alcohol in a short time is called binge drinking. This can be very harmful. Drinking alcohol can also be more dangerous if you are taking medicines or other drugs. Some of the effects caused by alcohol may include:  Loss of coordination.  Changes in mood and behavior.  Unclear thinking.  Trouble talking (slurred speech).  Throwing up (vomiting).  Confusion.  Slowed breathing.  Twitching and shaking (seizures).  Loss of consciousness. HOME CARE  Do not drive after drinking alcohol.  Drink enough water and fluids to keep your pee (urine) clear or pale yellow. Avoid caffeine.  Only take medicine as told by your doctor. GET HELP IF:  You throw up (vomit) many times.  You do not feel better after a few days.  You frequently have alcohol intoxication. Your doctor can help decide if you should see a substance use treatment counselor. GET HELP RIGHT AWAY IF:  You become shaky when you stop drinking.  You have twitching and shaking.  You throw up blood. It may look bright red or like coffee grounds.  You notice blood in your poop (bowel movements).  You become lightheaded or pass out (faint). MAKE SURE YOU:   Understand these instructions.  Will watch your condition.  Will get help right away if you are not doing well or get worse. Document Released: 05/03/2008 Document Revised:  07/18/2013 Document Reviewed: 04/20/2013 [facility] Patient Information 2014 [place], [place].

## 2014-02-17 NOTE — ED Notes (Signed)
Patient transported to CT 

## 2014-02-17 NOTE — ED Notes (Signed)
Pt stood up and had a near syncopal episode. Stated that he felt lightheaded and nausea. BP 96systolic HR-55 EDP notified.

## 2014-02-17 NOTE — ED Notes (Signed)
MD at bedside. 
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
## 2014-02-21 ENCOUNTER — Emergency Department (HOSPITAL_COMMUNITY): Payer: Medicare Other

## 2014-02-21 ENCOUNTER — Emergency Department (HOSPITAL_COMMUNITY)
Admission: EM | Admit: 2014-02-21 | Discharge: 2014-02-21 | Disposition: A | Discharge status: Home / Self Care | Payer: Medicare Other | Attending: Emergency Medicine | Admitting: Emergency Medicine

## 2014-02-21 ENCOUNTER — Encounter (HOSPITAL_COMMUNITY): Payer: Self-pay | Admitting: Emergency Medicine

## 2014-02-21 DIAGNOSIS — Z87891 Personal history of nicotine dependence: Secondary | ICD-10-CM | POA: Diagnosis not present

## 2014-02-21 DIAGNOSIS — Z8709 Personal history of other diseases of the respiratory system: Secondary | ICD-10-CM | POA: Insufficient documentation

## 2014-02-21 DIAGNOSIS — M1710 Unilateral primary osteoarthritis, unspecified knee: Secondary | ICD-10-CM | POA: Diagnosis not present

## 2014-02-21 DIAGNOSIS — F3289 Other specified depressive episodes: Secondary | ICD-10-CM | POA: Diagnosis not present

## 2014-02-21 DIAGNOSIS — F329 Major depressive disorder, single episode, unspecified: Secondary | ICD-10-CM | POA: Insufficient documentation

## 2014-02-21 DIAGNOSIS — R42 Dizziness and giddiness: Secondary | ICD-10-CM | POA: Diagnosis not present

## 2014-02-21 DIAGNOSIS — IMO0002 Reserved for concepts with insufficient information to code with codable children: Secondary | ICD-10-CM

## 2014-02-21 DIAGNOSIS — G8929 Other chronic pain: Secondary | ICD-10-CM | POA: Insufficient documentation

## 2014-02-21 DIAGNOSIS — Z8639 Personal history of other endocrine, nutritional and metabolic disease: Secondary | ICD-10-CM | POA: Insufficient documentation

## 2014-02-21 DIAGNOSIS — R0602 Shortness of breath: Secondary | ICD-10-CM | POA: Diagnosis not present

## 2014-02-21 DIAGNOSIS — Z951 Presence of aortocoronary bypass graft: Secondary | ICD-10-CM | POA: Diagnosis not present

## 2014-02-21 DIAGNOSIS — I369 Nonrheumatic tricuspid valve disorder, unspecified: Secondary | ICD-10-CM | POA: Diagnosis not present

## 2014-02-21 DIAGNOSIS — S3981XA Other specified injuries of abdomen, initial encounter: Secondary | ICD-10-CM | POA: Diagnosis not present

## 2014-02-21 DIAGNOSIS — Z862 Personal history of diseases of the blood and blood-forming organs and certain disorders involving the immune mechanism: Secondary | ICD-10-CM | POA: Diagnosis not present

## 2014-02-21 DIAGNOSIS — I2510 Atherosclerotic heart disease of native coronary artery without angina pectoris: Secondary | ICD-10-CM | POA: Diagnosis not present

## 2014-02-21 DIAGNOSIS — R071 Chest pain on breathing: Secondary | ICD-10-CM | POA: Diagnosis not present

## 2014-02-21 DIAGNOSIS — Z79899 Other long term (current) drug therapy: Secondary | ICD-10-CM | POA: Diagnosis not present

## 2014-02-21 DIAGNOSIS — I209 Angina pectoris, unspecified: Secondary | ICD-10-CM | POA: Insufficient documentation

## 2014-02-21 DIAGNOSIS — R079 Chest pain, unspecified: Secondary | ICD-10-CM | POA: Diagnosis not present

## 2014-02-21 DIAGNOSIS — M7989 Other specified soft tissue disorders: Secondary | ICD-10-CM | POA: Diagnosis not present

## 2014-02-21 DIAGNOSIS — Y92410 Unspecified street and highway as the place of occurrence of the external cause: Secondary | ICD-10-CM | POA: Insufficient documentation

## 2014-02-21 DIAGNOSIS — Z9889 Other specified postprocedural states: Secondary | ICD-10-CM | POA: Insufficient documentation

## 2014-02-21 DIAGNOSIS — T1490XA Injury, unspecified, initial encounter: Secondary | ICD-10-CM | POA: Diagnosis not present

## 2014-02-21 DIAGNOSIS — M940 Chondrocostal junction syndrome [Tietze]: Secondary | ICD-10-CM

## 2014-02-21 DIAGNOSIS — I10 Essential (primary) hypertension: Secondary | ICD-10-CM | POA: Diagnosis not present

## 2014-02-21 DIAGNOSIS — Y9389 Activity, other specified: Secondary | ICD-10-CM | POA: Insufficient documentation

## 2014-02-21 DIAGNOSIS — F410 Panic disorder [episodic paroxysmal anxiety] without agoraphobia: Secondary | ICD-10-CM | POA: Insufficient documentation

## 2014-02-21 LAB — I-STAT CHEM 8, ED
BUN: 16 mg/dL (ref 6–23)
CREATININE: 1 mg/dL (ref 0.50–1.35)
Calcium, Ion: 1.14 mmol/L (ref 1.13–1.30)
Chloride: 101 mEq/L (ref 96–112)
Glucose, Bld: 104 mg/dL — ABNORMAL HIGH (ref 70–99)
HCT: 49 % (ref 39.0–52.0)
Hemoglobin: 16.7 g/dL (ref 13.0–17.0)
Potassium: 4.3 mEq/L (ref 3.7–5.3)
Sodium: 137 mEq/L (ref 137–147)
TCO2: 25 mmol/L (ref 0–100)

## 2014-02-21 LAB — CBC WITH DIFFERENTIAL/PLATELET
Basophils Absolute: 0 10*3/uL (ref 0.0–0.1)
Basophils Relative: 0 % (ref 0–1)
Eosinophils Absolute: 0 10*3/uL (ref 0.0–0.7)
Eosinophils Relative: 0 % (ref 0–5)
HCT: 43.5 % (ref 39.0–52.0)
Hemoglobin: 15.4 g/dL (ref 13.0–17.0)
Lymphocytes Relative: 25 % (ref 12–46)
Lymphs Abs: 1.7 10*3/uL (ref 0.7–4.0)
MCH: 32.1 pg (ref 26.0–34.0)
MCHC: 35.4 g/dL (ref 30.0–36.0)
MCV: 90.6 fL (ref 78.0–100.0)
Monocytes Absolute: 0.6 10*3/uL (ref 0.1–1.0)
Monocytes Relative: 9 % (ref 3–12)
NEUTROS PCT: 65 % (ref 43–77)
Neutro Abs: 4.2 10*3/uL (ref 1.7–7.7)
PLATELETS: 172 10*3/uL (ref 150–400)
RBC: 4.8 MIL/uL (ref 4.22–5.81)
RDW: 12.9 % (ref 11.5–15.5)
WBC: 6.5 10*3/uL (ref 4.0–10.5)

## 2014-02-21 LAB — I-STAT TROPONIN, ED: Troponin i, poc: 0 ng/mL (ref 0.00–0.08)

## 2014-02-21 LAB — PRO B NATRIURETIC PEPTIDE: PRO B NATRI PEPTIDE: 84.4 pg/mL (ref 0–125)

## 2014-02-21 MED ORDER — NAPROXEN 500 MG PO TABS: 500.0000 mg | ORAL_TABLET | Freq: Two times a day (BID) | ORAL | Status: DC

## 2014-02-21 MED ORDER — MORPHINE SULFATE 4 MG/ML IJ SOLN
4.0000 mg | Freq: Once | INTRAMUSCULAR | Status: AC
  Administered 2014-02-21: 4 mg via INTRAVENOUS
  Filled 2014-02-21: qty 1

## 2014-02-21 MED ORDER — NITROGLYCERIN 0.4 MG SL SUBL
0.4000 mg | SUBLINGUAL_TABLET | SUBLINGUAL | Status: DC | PRN
  Administered 2014-02-21: 0.4 mg via SUBLINGUAL
  Filled 2014-02-21: qty 1

## 2014-02-21 NOTE — ED Notes (Signed)
Pt reports feeling dizzyness that started yesterday.  Stopped taking Oxycodone yesterday at 12pm when his legs started swelling.  Pt denies SOB, vomiting, diarrhea.  Pt states he has not had a bowel movement since Saturday.

## 2014-02-21 NOTE — ED Notes (Signed)
Results of chem 8 and troponin shown to [place], VF CorporationPA-C
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
## 2014-02-21 NOTE — ED Notes (Signed)
Echo at bedside

## 2014-02-21 NOTE — Progress Notes (Signed)
*  PRELIMINARY RESULTS* Echocardiogram 2D Echocardiogram has been performed.  [name], [name] 02/21/2014, 3:20 PM

## 2014-02-21 NOTE — ED Notes (Signed)
Patient requesting update from provider, PA to bedside to speak with patient and family.
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
## 2014-02-21 NOTE — Discharge Instructions (Signed)

## 2014-02-21 NOTE — ED Notes (Signed)
Pt became upset when being moved to another room for holding prior to discharge, AD Zach informed and to bedside for discharge.

## 2014-02-22 ENCOUNTER — Telehealth: Payer: Self-pay | Admitting: Interventional Cardiology

## 2014-02-22 NOTE — Telephone Encounter (Signed)
Follow up  ° ° ° °Returning call back to nurse  °

## 2014-02-22 NOTE — Telephone Encounter (Signed)
returned pt call.pt given ok to take Naproxen as directed for the next 5-7 days per Dr.Smith.pt is to make sure he does not miss any doses of his bp medication.pt agreeable with plan and verbalized understanding.

## 2014-02-22 NOTE — Telephone Encounter (Signed)
New problem    Pt was seen in ER last night and stated he was told to come in office today. Please call pt.
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
## 2014-02-22 NOTE — Telephone Encounter (Signed)
returned pt call.lmom  for pt to call back 

## 2014-02-23 NOTE — ED Provider Notes (Signed)
Medical screening examination/treatment/procedure(s) were conducted as a shared visit with non-physician practitioner(s) and myself.  I personally evaluated the patient during the encounter.   EKG Interpretation   Date/Time:  Thursday February 21 2014 10:48:04 EDT Ventricular Rate:  65 PR Interval:  202 QRS Duration: 88 QT Interval:  403 QTC Calculation: 419 R Axis:   68 Text Interpretation:  Sinus rhythm Nonspecific T abnormalities, lateral  leads ED PHYSICIAN INTERPRETATION AVAILABLE IN [facility] Confirmed  by TEST, Record ([id number]) on 02/23/2014 1:47:43 PM     Patient is in no acute distress. 2-D echo reveals a trivial pericardial effusion. Appropriate for outpatient management  Donnetta HutchingBrian Rockey Guarino, MD 02/23/14 [id number]

## 2014-05-07 ENCOUNTER — Encounter: Payer: Self-pay | Admitting: Interventional Cardiology

## 2014-06-04 ENCOUNTER — Other Ambulatory Visit: Payer: Self-pay | Admitting: Emergency Medicine

## 2014-06-04 ENCOUNTER — Ambulatory Visit (INDEPENDENT_AMBULATORY_CARE_PROVIDER_SITE_OTHER): Payer: Medicare Other | Admitting: Emergency Medicine

## 2014-06-04 ENCOUNTER — Ambulatory Visit: Payer: Medicare Other | Admitting: Interventional Cardiology

## 2014-06-04 VITALS — BP 160/80 | HR 80 | Temp 98.0°F | Resp 18 | Ht 69.75 in | Wt 251.4 lb

## 2014-06-04 DIAGNOSIS — I10 Essential (primary) hypertension: Secondary | ICD-10-CM | POA: Diagnosis not present

## 2014-06-04 DIAGNOSIS — F411 Generalized anxiety disorder: Secondary | ICD-10-CM

## 2014-06-04 DIAGNOSIS — F4310 Post-traumatic stress disorder, unspecified: Secondary | ICD-10-CM | POA: Diagnosis not present

## 2014-06-04 MED ORDER — LISINOPRIL 20 MG PO TABS: 20.0000 mg | ORAL_TABLET | Freq: Every day | ORAL | Status: DC

## 2014-06-04 MED ORDER — METOPROLOL SUCCINATE ER 25 MG PO TB24: 25.0000 mg | ORAL_TABLET | Freq: Every day | ORAL | Status: DC

## 2014-06-04 MED ORDER — ALPRAZOLAM 0.5 MG PO TABS: 0.5000 mg | ORAL_TABLET | Freq: Three times a day (TID) | ORAL | Status: DC | PRN

## 2014-06-04 NOTE — Patient Instructions (Signed)
Hypertension Hypertension, commonly called high blood pressure, is when the force of blood pumping through your arteries is too strong. Your arteries are the blood vessels that carry blood from your heart throughout your body. A blood pressure reading consists of a higher number over a lower number, such as 110/72. The higher number (systolic) is the pressure inside your arteries when your heart pumps. The lower number (diastolic) is the pressure inside your arteries when your heart relaxes. Ideally you want your blood pressure below 120/80. Hypertension forces your heart to work harder to pump blood. Your arteries may become narrow or stiff. Having hypertension puts you at risk for heart disease, stroke, and other problems.  RISK FACTORS Some risk factors for high blood pressure are controllable. Others are not.  Risk factors you cannot control include:   Race. You may be at higher risk if you are African American.  Age. Risk increases with age.  Gender. Men are at higher risk than women before age 45 years. After age 65, women are at higher risk than men. Risk factors you can control include:  Not getting enough exercise or physical activity.  Being overweight.  Getting too much fat, sugar, calories, or salt in your diet.  Drinking too much alcohol. SIGNS AND SYMPTOMS Hypertension does not usually cause signs or symptoms. Extremely high blood pressure (hypertensive crisis) may cause headache, anxiety, shortness of breath, and nosebleed. DIAGNOSIS  To check if you have hypertension, your health care provider will measure your blood pressure while you are seated, with your arm held at the level of your heart. It should be measured at least twice using the same arm. Certain conditions can cause a difference in blood pressure between your right and left arms. A blood pressure reading that is higher than normal on one occasion does not mean that you need treatment. If one blood pressure reading  is high, ask your health care provider about having it checked again. TREATMENT  Treating high blood pressure includes making lifestyle changes and possibly taking medication. Living a healthy lifestyle can help lower high blood pressure. You may need to change some of your habits. Lifestyle changes may include:  Following the DASH diet. This diet is high in fruits, vegetables, and whole grains. It is low in salt, red meat, and added sugars.  Getting at least 2 1/2 hours of brisk physical activity every week.  Losing weight if necessary.  Not smoking.  Limiting alcoholic beverages.  Learning ways to reduce stress. If lifestyle changes are not enough to get your blood pressure under control, your health care provider may prescribe medicine. You may need to take more than one. Work closely with your health care provider to understand the risks and benefits. HOME CARE INSTRUCTIONS  Have your blood pressure rechecked as directed by your health care provider.   Only take medicine as directed by your health care provider. Follow the directions carefully. Blood pressure medicines must be taken as prescribed. The medicine does not work as well when you skip doses. Skipping doses also puts you at risk for problems.   Do not smoke.   Monitor your blood pressure at home as directed by your health care provider. SEEK MEDICAL CARE IF:   You think you are having a reaction to medicines taken.  You have recurrent headaches or feel dizzy.  You have swelling in your ankles.  You have trouble with your vision. SEEK IMMEDIATE MEDICAL CARE IF:  You develop a severe headache or   confusion.  You have unusual weakness, numbness, or feel faint.  You have severe chest or abdominal pain.  You vomit repeatedly.  You have trouble breathing. MAKE SURE YOU:   Understand these instructions.  Will watch your condition.  Will get help right away if you are not doing well or get  worse. Document Released: 11/15/2005 Document Revised: 11/20/2013 Document Reviewed: 09/07/2013 ExitCare Patient Information 2015 [employer]. This information is not intended to replace advice given to you by your health care provider. Make sure you discuss any questions you have with your health care provider.  

## 2014-06-04 NOTE — Progress Notes (Signed)
[facility] [address], [place] [id number] [phone]  Date:  06/04/2014   Name:  Ronald SciaraJoseph M [name]   DOB:  [birth year]   MRN:  [id number]  PCP:  Lesleigh NoeSMITH III,HENRY W, MD    Chief Complaint: Medication Refill   History of Present Illness:  Ronald SciaraJoseph M [name] is a 68 y.o. very pleasant male patient who presents with the following:  Comes in for a refill on his medications.  His last visit his blood pressure was up an dit remains up today.  He continues to experience anxiety but is largely controlled with medication. No improvement with over the counter medications or other home remedies. Denies other complaint or health concern today.   There are no active problems to display for this patient.   Past Medical History  Diagnosis Date  . Hypertension   . Coronary artery disease   . High cholesterol   . Anxiety   . Panic attacks   . Anginal pain 01/2005  . History of bronchitis     "used to get it alot" (08/25/2012)  . Arthritis     "knees"  . Chronic lower back pain   . PTSD (post-traumatic stress disorder)     "have been treated in the past" (08/25/2012)  . Depression     Past Surgical History  Procedure Laterality Date  . Coronary artery bypass graft  01/2005    CABG X3  . Cardiac catheterization  01/2005  . Varicose vein surgery  1980's    LLE  . Shrapnel  1969    LLE; left lateral thumb (required grafting)    History  Substance Use Topics  . Smoking status: Former Smoker -- 2.00 packs/day for 5 years  . Smokeless tobacco: Never Used     Comment: 08/25/2012 "quit smoking cigarettes 40 yr ago"  . Alcohol Use: Yes     Comment: social    Family History  Problem Relation Age of Onset  . Diabetes Mother   . Heart disease Father   . Cancer Brother     Allergies  Allergen Reactions  . Codeine Palpitations  . Vicodin [Hydrocodone-Acetaminophen] Swelling and Palpitations    Medication list has been reviewed and updated.  Current Outpatient  Prescriptions on File Prior to Visit  Medication Sig Dispense Refill  . ALPRAZolam (XANAX) 0.5 MG tablet Take 1 tablet (0.5 mg total) by mouth 3 (three) times daily as needed for anxiety.  90 tablet  2  . ibuprofen (ADVIL,MOTRIN) 200 MG tablet Take 200 mg by mouth every 6 (six) hours as needed for moderate pain.      . metoprolol succinate (TOPROL-XL) 25 MG 24 hr tablet Take 25 mg by mouth daily.        . naproxen (NAPROSYN) 500 MG tablet Take 1 tablet (500 mg total) by mouth 2 (two) times daily.  20 tablet  0  . nitroGLYCERIN (NITROSTAT) 0.4 MG SL tablet Place 0.4 mg under the tongue every 5 (five) minutes x 3 doses as needed. For chest pain      . albuterol (PROVENTIL HFA;VENTOLIN HFA) 108 (90 BASE) MCG/ACT inhaler Inhale 2 puffs into the lungs every 6 (six) hours as needed for wheezing or shortness of breath.       . lisinopril (PRINIVIL,ZESTRIL) 10 MG tablet Take 1 tablet (10 mg total) by mouth daily.  30 tablet  0   No current facility-administered medications on file prior to visit.    Review of Systems:  As per HPI, otherwise negative.    Physical Examination: Filed Vitals:   06/04/14 1524  BP: 160/80  Pulse: 80  Temp: 98 F (36.7 C)  Resp: 18   Filed Vitals:   06/04/14 1524  Height: 5' 9.75" (1.772 m)  Weight: 251 lb 6.4 oz (114.034 kg)   Body mass index is 36.32 kg/(m^2). Ideal Body Weight: Weight in (lb) to have BMI = 25: 172.6  GEN: WDWN, NAD, Non-toxic, A & O x 3 HEENT: Atraumatic, Normocephalic. Neck supple. No masses, No LAD. Ears and Nose: No external deformity. CV: RRR, No M/G/R. No JVD. No thrill. No extra heart sounds. PULM: CTA B, no wheezes, crackles, rhonchi. No retractions. No resp. distress. No accessory muscle use. ABD: S, NT, ND, +BS. No rebound. No HSM. EXTR: No c/c/e NEURO Normal gait.  PSYCH: Normally interactive. Conversant. Not depressed or anxious appearing.  Calm demeanor.    Assessment and Plan: Hypertension Anxiety  PTSD  Signed,   Phillips OdorJeffery Tonique Mendonca, MD

## 2014-06-27 ENCOUNTER — Ambulatory Visit: Payer: Medicare Other | Admitting: Interventional Cardiology

## 2014-06-28 ENCOUNTER — Encounter: Payer: Self-pay | Admitting: Nurse Practitioner

## 2014-07-04 ENCOUNTER — Emergency Department (HOSPITAL_COMMUNITY)
Admission: EM | Admit: 2014-07-04 | Discharge: 2014-07-04 | Disposition: A | Discharge status: Home / Self Care | Payer: Medicare Other | Attending: Emergency Medicine | Admitting: Emergency Medicine

## 2014-07-04 ENCOUNTER — Encounter (HOSPITAL_COMMUNITY): Payer: Self-pay | Admitting: Emergency Medicine

## 2014-07-04 ENCOUNTER — Emergency Department (HOSPITAL_COMMUNITY): Payer: Medicare Other

## 2014-07-04 DIAGNOSIS — R000 Tachycardia, unspecified: Secondary | ICD-10-CM | POA: Diagnosis not present

## 2014-07-04 DIAGNOSIS — F329 Major depressive disorder, single episode, unspecified: Secondary | ICD-10-CM | POA: Diagnosis not present

## 2014-07-04 DIAGNOSIS — Z8709 Personal history of other diseases of the respiratory system: Secondary | ICD-10-CM | POA: Insufficient documentation

## 2014-07-04 DIAGNOSIS — I2510 Atherosclerotic heart disease of native coronary artery without angina pectoris: Secondary | ICD-10-CM | POA: Diagnosis not present

## 2014-07-04 DIAGNOSIS — F3289 Other specified depressive episodes: Secondary | ICD-10-CM | POA: Diagnosis not present

## 2014-07-04 DIAGNOSIS — Z7982 Long term (current) use of aspirin: Secondary | ICD-10-CM | POA: Insufficient documentation

## 2014-07-04 DIAGNOSIS — Z8639 Personal history of other endocrine, nutritional and metabolic disease: Secondary | ICD-10-CM | POA: Diagnosis not present

## 2014-07-04 DIAGNOSIS — Z862 Personal history of diseases of the blood and blood-forming organs and certain disorders involving the immune mechanism: Secondary | ICD-10-CM | POA: Diagnosis not present

## 2014-07-04 DIAGNOSIS — I10 Essential (primary) hypertension: Secondary | ICD-10-CM | POA: Diagnosis not present

## 2014-07-04 DIAGNOSIS — R404 Transient alteration of awareness: Secondary | ICD-10-CM | POA: Diagnosis not present

## 2014-07-04 DIAGNOSIS — R42 Dizziness and giddiness: Secondary | ICD-10-CM

## 2014-07-04 DIAGNOSIS — K5900 Constipation, unspecified: Secondary | ICD-10-CM | POA: Insufficient documentation

## 2014-07-04 DIAGNOSIS — G8929 Other chronic pain: Secondary | ICD-10-CM | POA: Insufficient documentation

## 2014-07-04 DIAGNOSIS — I209 Angina pectoris, unspecified: Secondary | ICD-10-CM | POA: Diagnosis not present

## 2014-07-04 DIAGNOSIS — R5381 Other malaise: Secondary | ICD-10-CM | POA: Insufficient documentation

## 2014-07-04 DIAGNOSIS — Z79899 Other long term (current) drug therapy: Secondary | ICD-10-CM | POA: Insufficient documentation

## 2014-07-04 DIAGNOSIS — F411 Generalized anxiety disorder: Secondary | ICD-10-CM | POA: Insufficient documentation

## 2014-07-04 DIAGNOSIS — Z87891 Personal history of nicotine dependence: Secondary | ICD-10-CM | POA: Insufficient documentation

## 2014-07-04 DIAGNOSIS — R5383 Other fatigue: Secondary | ICD-10-CM

## 2014-07-04 DIAGNOSIS — Z951 Presence of aortocoronary bypass graft: Secondary | ICD-10-CM | POA: Diagnosis not present

## 2014-07-04 DIAGNOSIS — Z9889 Other specified postprocedural states: Secondary | ICD-10-CM | POA: Insufficient documentation

## 2014-07-04 DIAGNOSIS — M129 Arthropathy, unspecified: Secondary | ICD-10-CM | POA: Insufficient documentation

## 2014-07-04 DIAGNOSIS — R61 Generalized hyperhidrosis: Secondary | ICD-10-CM | POA: Diagnosis not present

## 2014-07-04 LAB — CBC
HEMATOCRIT: 43.4 % (ref 39.0–52.0)
HEMOGLOBIN: 14.6 g/dL (ref 13.0–17.0)
MCH: 30.9 pg (ref 26.0–34.0)
MCHC: 33.6 g/dL (ref 30.0–36.0)
MCV: 91.9 fL (ref 78.0–100.0)
Platelets: 130 10*3/uL — ABNORMAL LOW (ref 150–400)
RBC: 4.72 MIL/uL (ref 4.22–5.81)
RDW: 13 % (ref 11.5–15.5)
WBC: 4.3 10*3/uL (ref 4.0–10.5)

## 2014-07-04 LAB — URINALYSIS, ROUTINE W REFLEX MICROSCOPIC
BILIRUBIN URINE: NEGATIVE
Glucose, UA: NEGATIVE mg/dL
Hgb urine dipstick: NEGATIVE
KETONES UR: NEGATIVE mg/dL
Leukocytes, UA: NEGATIVE
NITRITE: NEGATIVE
PROTEIN: NEGATIVE mg/dL
SPECIFIC GRAVITY, URINE: 1.023 (ref 1.005–1.030)
UROBILINOGEN UA: 2 mg/dL — AB (ref 0.0–1.0)
pH: 6 (ref 5.0–8.0)

## 2014-07-04 LAB — COMPREHENSIVE METABOLIC PANEL
ALBUMIN: 3.6 g/dL (ref 3.5–5.2)
ALT: 190 U/L — ABNORMAL HIGH (ref 0–53)
AST: 130 U/L — AB (ref 0–37)
Alkaline Phosphatase: 61 U/L (ref 39–117)
Anion gap: 15 (ref 5–15)
BILIRUBIN TOTAL: 0.4 mg/dL (ref 0.3–1.2)
BUN: 16 mg/dL (ref 6–23)
CHLORIDE: 102 meq/L (ref 96–112)
CO2: 22 mEq/L (ref 19–32)
Calcium: 9.2 mg/dL (ref 8.4–10.5)
Creatinine, Ser: 0.9 mg/dL (ref 0.50–1.35)
GFR calc Af Amer: >90 mL/min (ref >90)
GFR calc non Af Amer: 87 mL/min — ABNORMAL LOW (ref >90)
Glucose, Bld: 115 mg/dL — ABNORMAL HIGH (ref 70–99)
Potassium: 4 mEq/L (ref 3.7–5.3)
Sodium: 139 mEq/L (ref 137–147)
Total Protein: 8.2 g/dL (ref 6.0–8.3)

## 2014-07-04 LAB — RAPID URINE DRUG SCREEN, HOSP PERFORMED
AMPHETAMINES: NOT DETECTED
BARBITURATES: NOT DETECTED
BENZODIAZEPINES: NOT DETECTED
Cocaine: NOT DETECTED
Opiates: NOT DETECTED
Tetrahydrocannabinol: NOT DETECTED

## 2014-07-04 LAB — I-STAT TROPONIN, ED: Troponin i, poc: 0 ng/mL (ref 0.00–0.08)

## 2014-07-04 LAB — LACTIC ACID, PLASMA: Lactic Acid, Venous: 2.1 mmol/L (ref 0.5–2.2)

## 2014-07-04 MED ORDER — SODIUM CHLORIDE 0.9 % IV BOLUS (SEPSIS)
1000.0000 mL | Freq: Once | INTRAVENOUS | Status: AC
  Administered 2014-07-04: 1000 mL via INTRAVENOUS

## 2014-07-04 NOTE — ED Notes (Signed)
Pt here for dizziness with standing, hx of multiple CHI and reports he did not eat a normal breakfast, cbg 119

## 2014-07-04 NOTE — Discharge Instructions (Signed)

## 2014-07-04 NOTE — ED Provider Notes (Signed)
I saw and evaluated the patient, reviewed the resident's note and I agree with the findings and plan.   EKG Interpretation None      Here with some orthostatic dizziness, only dizziness when standing up. No CP, SOB. No fevers. Orthostatics ok, feeling better after fluids. Labs ok. Stable for discharge. Patient states concern about family poisoning him. No SI/HI. Hx of PTSD. No hx of schizophrenia, BPD. Instructed to f/u with PCP about his concerns and heavy metal poisoning.  Elwin MochaBlair Linn Goetze, MD 07/04/14 1452

## 2014-07-04 NOTE — ED Provider Notes (Signed)
CSN: [id number]     Arrival date & time 07/04/14  4098 History   First MD Initiated Contact with Patient 07/04/14 [phone]     Chief Complaint  Patient presents with  . Dizziness     (Consider location/radiation/quality/duration/timing/severity/associated sxs/prior Treatment) Patient is a 68 y.o. male presenting with dizziness.  Dizziness Associated symptoms: no chest pain, no diarrhea, no nausea, no palpitations, no shortness of breath and no vomiting     Mr. Wimberly is a 68 year old man with CAD s/p CABG 2006, HTN, TBI in 01/2014 who presents with dizziness. He asked to speak to me in private. He said this morning when he stood he felt lightheaded and thought his vision became blurry. He did not lose consciousness or control of his bowel or bladder and remembers the entire event. He also notes a general fatigue that he thinks is worse when he eat food at home but better when he eats food prepared elsewhere. He is concerned that some of his ant and wasp poison went missing and there has been a lot of distress at home with his wife and stepson. Stepson told him during argument that patient does not know "what he is capable of."   Past Medical History  Diagnosis Date  . Hypertension   . Coronary artery disease   . High cholesterol   . Anxiety   . Panic attacks   . Anginal pain 01/2005  . History of bronchitis     "used to get it alot" (08/25/2012)  . Arthritis     "knees"  . Chronic lower back pain   . PTSD (post-traumatic stress disorder)     "have been treated in the past" (08/25/2012)  . Depression    Past Surgical History  Procedure Laterality Date  . Coronary artery bypass graft  01/2005    CABG X3  . Cardiac catheterization  01/2005  . Varicose vein surgery  1980's    LLE  . Shrapnel  1969    LLE; left lateral thumb (required grafting)   Family History  Problem Relation Age of Onset  . Diabetes Mother   . Heart disease Father   . Cancer Brother    History  Substance Use  Topics  . Smoking status: Former Smoker -- 2.00 packs/day for 5 years  . Smokeless tobacco: Never Used     Comment: 08/25/2012 "quit smoking cigarettes 40 yr ago"  . Alcohol Use: Yes     Comment: social    Review of Systems  Constitutional: Positive for diaphoresis, activity change and fatigue. Negative for fever and unexpected weight change.  Respiratory: Negative for chest tightness and shortness of breath.   Cardiovascular: Negative for chest pain and palpitations.  Gastrointestinal: Positive for constipation. Negative for nausea, vomiting, abdominal pain and diarrhea.  Neurological: Positive for dizziness, weakness and light-headedness. Negative for speech difficulty and numbness.  Psychiatric/Behavioral: Negative for confusion.      Allergies  Codeine and Vicodin  Home Medications   Prior to Admission medications   Medication Sig Start Date End Date Taking? Authorizing Provider  ALPRAZolam Prudy Feeler) 0.5 MG tablet Take 1 tablet (0.5 mg total) by mouth 3 (three) times daily as needed for anxiety. 06/04/14  Yes Phillips Odor, MD  aspirin EC 81 MG tablet Take 81 mg by mouth daily.   Yes Historical Provider, MD  metoprolol succinate (TOPROL-XL) 25 MG 24 hr tablet Take 1 tablet (25 mg total) by mouth daily. 06/04/14  Yes Phillips Odor, MD   BP  180/80  Pulse 72  Temp(Src) 98.4 F (36.9 C) (Oral)  Resp 20  SpO2 96% Physical Exam  Constitutional: He is oriented to person, place, and time. He appears well-developed and well-nourished. No distress.  HENT:  Head: Normocephalic and atraumatic.  OP clear but a little dry  Eyes: EOM are normal. Pupils are equal, round, and reactive to light. No scleral icterus.  Cardiovascular: Regular rhythm, normal heart sounds and intact distal pulses.  Exam reveals no gallop and no friction rub.   No murmur heard. tachycardic  Pulmonary/Chest: Effort normal and breath sounds normal. No respiratory distress. He has no wheezes. He has no rales. He  exhibits no tenderness.  Abdominal: Soft. Bowel sounds are normal. He exhibits no distension. There is no tenderness.  Musculoskeletal: He exhibits no edema.  Neurological: He is alert and oriented to person, place, and time. He has normal reflexes. No cranial nerve deficit.  Skin: He is not diaphoretic.    ED Course  Procedures (including critical care time) Labs Review Labs Reviewed  COMPREHENSIVE METABOLIC PANEL  CBC  LACTIC ACID, PLASMA    Imaging Review No results found.   EKG Interpretation None      MDM   Final diagnoses:  None    10:00AM: Patient notes presyncope when he was standing this morning. He also has a general fatigue for a few months. He is paranoid that his wife or stepson may be poisoning him as noted in HPI above. Hypertensive but otherwise exam wnl. Patient has cardiac history of CABG in the past. Will check EKG, troponin, CXR, cbc, cmp, lactic acid, UA, UDS and reassess. Will get 1 L NS as a little dry on exam and tachycardic.  11:27AM: Patient signed out to Hazel SamsBlair Walden  Orman Matsumura L Rabon Scholle, MD 07/04/14 [phone]

## 2014-07-04 NOTE — ED Provider Notes (Signed)
Date: 07/04/2014  Rate: 65  Rhythm: normal sinus rhythm  QRS Axis: normal  Intervals: normal  ST/T Wave abnormalities: nonspecific ST/T changes  Conduction Disutrbances:none  Narrative Interpretation:   Old EKG Reviewed: No significant change from 02/21/2014 interpreted by me   Doug SouSam Melvern Ramone, MD 07/04/14 [phone]

## 2014-07-04 NOTE — ED Notes (Signed)
MD at bedside. 

## 2014-07-05 LAB — URINE CULTURE
COLONY COUNT: NO GROWTH
CULTURE: NO GROWTH

## 2014-08-07 ENCOUNTER — Ambulatory Visit (INDEPENDENT_AMBULATORY_CARE_PROVIDER_SITE_OTHER): Payer: Medicare Other | Admitting: Interventional Cardiology

## 2014-08-07 ENCOUNTER — Encounter: Payer: Self-pay | Admitting: Interventional Cardiology

## 2014-08-07 VITALS — BP 190/100 | HR 80 | Ht 71.0 in | Wt 257.0 lb

## 2014-08-07 DIAGNOSIS — I10 Essential (primary) hypertension: Secondary | ICD-10-CM | POA: Diagnosis not present

## 2014-08-07 DIAGNOSIS — E785 Hyperlipidemia, unspecified: Secondary | ICD-10-CM | POA: Diagnosis not present

## 2014-08-07 DIAGNOSIS — I25810 Atherosclerosis of coronary artery bypass graft(s) without angina pectoris: Secondary | ICD-10-CM

## 2014-08-07 NOTE — Progress Notes (Signed)
Patient ID: [name], male   DOB: 22-Dec-1945, 68 y.o.   MRN: [id number] Past Medical History  coronary artery disease status post CABG   Anxiety   Hyperlipidemia   Hypertension      [address], [address] [place], [place]  [address] Phone: [phone] Fax:  [phone]  Date:  08/07/2014   ID:  [name], DOB 22-Apr-1946, MRN [id number]  PCP:  Lesleigh Noe, MD   ASSESSMENT:  1. Coronary artery disease, stable without angina. Status post bypass surgery 9 years ago 2. Hypertension, poorly controlled. Poor compliance with medical regimen 3 hyperlipidemia, with poor control related to intolerance of statin therapy 4. Obesity  PLAN:  1. increase physical activity 2. Consider PSK 9 therapy in this patient has had coronary bypass surgery and is intolerant of other therapies 3. Decrease salt intake 4. Continue current medical regimen I. Clinical followup in one year   SUBJECTIVE: [name] is a 68 y.o. male who denies angina. He denies dyspnea, palpitations, and edema to   Wt Readings from Last 3 Encounters:  08/07/14 257 lb (116.574 kg)  06/04/14 251 lb 6.4 oz (114.034 kg)  01/10/14 250 lb (113.399 kg)     Past Medical History  Diagnosis Date  . Hypertension   . Coronary artery disease   . High cholesterol   . Anxiety   . Panic attacks   . Anginal pain 01/2005  . History of bronchitis     "used to get it alot" (08/25/2012)  . Arthritis     "knees"  . Chronic lower back pain   . PTSD (post-traumatic stress disorder)     "have been treated in the past" (08/25/2012)  . Depression     Current Outpatient Prescriptions  Medication Sig Dispense Refill  . ALPRAZolam (XANAX) 0.5 MG tablet Take 0.5 mg by mouth.      Marland Kitchen aspirin EC 81 MG tablet Take 81 mg by mouth daily.      . metoprolol succinate (TOPROL-XL) 25 MG 24 hr tablet Take 25 mg by mouth.       No current facility-administered medications for this visit.    Allergies:    Allergies   Allergen Reactions  . Codeine Palpitations  . Vicodin [Hydrocodone-Acetaminophen] Swelling, Palpitations and Nausea And Vomiting    Social History:  The patient  reports that he has quit smoking. He has never used smokeless tobacco. He reports that he drinks alcohol. He reports that he does not use illicit drugs.   ROS:  Please see the history of present illness.   Denies orthopnea, PND, palpitations, syncope, stroke symptoms.   All other systems reviewed and negative.   OBJECTIVE: VS:  BP 190/100  Pulse 80  Ht  (1.803 m)  Wt 257 lb (116.574 kg)  BMI 35.86 kg/m2 Well nourished, well developed, in no acute distress, gaining weight. HEENT: normal Neck: JVD flat. Carotid bruit absent  Cardiac:  normal S1, S2; RRR; no murmur Lungs:  clear to auscultation bilaterally, no wheezing, rhonchi or rales Abd: soft, nontender, no hepatomegaly Ext: Edema absent. Pulses 2+ Skin: warm and dry Neuro:  CNs 2-12 intact, no focal abnormalities noted  EKG:  Not performed       Signed, Darci Needle III, MD 08/07/2014 2:33 PM

## 2014-08-07 NOTE — Patient Instructions (Addendum)
Your physician recommends that you continue on your current medications as directed. Please refer to the Current Medication list given to you today.  You have been referred to [facility]    Your physician wants you to follow-up in: 6-12 months You will receive a reminder letter in the mail two months in advance. If you don't receive a letter, please call our office to schedule the follow-up appointment.

## 2014-09-03 ENCOUNTER — Ambulatory Visit: Payer: Medicare Other | Admitting: Pharmacist

## 2014-09-09 ENCOUNTER — Other Ambulatory Visit: Payer: Self-pay | Admitting: Pharmacist

## 2014-09-09 DIAGNOSIS — E785 Hyperlipidemia, unspecified: Secondary | ICD-10-CM

## 2014-09-18 ENCOUNTER — Other Ambulatory Visit: Payer: Medicare Other

## 2014-09-24 ENCOUNTER — Ambulatory Visit: Payer: Medicare Other | Admitting: Pharmacist

## 2014-09-24 ENCOUNTER — Other Ambulatory Visit: Payer: Medicare Other

## 2014-10-30 ENCOUNTER — Telehealth: Payer: Self-pay | Admitting: *Deleted

## 2014-10-30 NOTE — Telephone Encounter (Signed)
New Message  Pt said that he had the flu shot with [facility] but does not remember the date; pt stated he will call back with the details.

## 2014-11-08 ENCOUNTER — Other Ambulatory Visit: Payer: Self-pay

## 2014-11-08 NOTE — Telephone Encounter (Signed)
Pharm reqs RF of xanax. Pended. 

## 2014-11-10 ENCOUNTER — Other Ambulatory Visit: Payer: Self-pay | Admitting: Emergency Medicine

## 2014-11-10 ENCOUNTER — Encounter (HOSPITAL_COMMUNITY): Payer: Self-pay | Admitting: Emergency Medicine

## 2014-11-10 ENCOUNTER — Emergency Department (HOSPITAL_COMMUNITY)
Admission: EM | Admit: 2014-11-10 | Discharge: 2014-11-10 | Disposition: A | Discharge status: Home / Self Care | Payer: Medicare Other | Attending: Emergency Medicine | Admitting: Emergency Medicine

## 2014-11-10 ENCOUNTER — Telehealth: Payer: Self-pay

## 2014-11-10 DIAGNOSIS — Z8639 Personal history of other endocrine, nutritional and metabolic disease: Secondary | ICD-10-CM | POA: Diagnosis not present

## 2014-11-10 DIAGNOSIS — Z9889 Other specified postprocedural states: Secondary | ICD-10-CM | POA: Insufficient documentation

## 2014-11-10 DIAGNOSIS — R040 Epistaxis: Secondary | ICD-10-CM | POA: Diagnosis not present

## 2014-11-10 DIAGNOSIS — F419 Anxiety disorder, unspecified: Secondary | ICD-10-CM | POA: Diagnosis not present

## 2014-11-10 DIAGNOSIS — F329 Major depressive disorder, single episode, unspecified: Secondary | ICD-10-CM | POA: Insufficient documentation

## 2014-11-10 DIAGNOSIS — R42 Dizziness and giddiness: Secondary | ICD-10-CM | POA: Diagnosis not present

## 2014-11-10 DIAGNOSIS — M179 Osteoarthritis of knee, unspecified: Secondary | ICD-10-CM | POA: Insufficient documentation

## 2014-11-10 DIAGNOSIS — Z951 Presence of aortocoronary bypass graft: Secondary | ICD-10-CM | POA: Insufficient documentation

## 2014-11-10 DIAGNOSIS — I159 Secondary hypertension, unspecified: Secondary | ICD-10-CM | POA: Diagnosis not present

## 2014-11-10 DIAGNOSIS — Z87891 Personal history of nicotine dependence: Secondary | ICD-10-CM | POA: Insufficient documentation

## 2014-11-10 DIAGNOSIS — Z8709 Personal history of other diseases of the respiratory system: Secondary | ICD-10-CM | POA: Insufficient documentation

## 2014-11-10 DIAGNOSIS — Z79899 Other long term (current) drug therapy: Secondary | ICD-10-CM | POA: Insufficient documentation

## 2014-11-10 DIAGNOSIS — Z7982 Long term (current) use of aspirin: Secondary | ICD-10-CM | POA: Insufficient documentation

## 2014-11-10 DIAGNOSIS — I2510 Atherosclerotic heart disease of native coronary artery without angina pectoris: Secondary | ICD-10-CM | POA: Diagnosis not present

## 2014-11-10 DIAGNOSIS — I10 Essential (primary) hypertension: Secondary | ICD-10-CM | POA: Diagnosis present

## 2014-11-10 DIAGNOSIS — G8929 Other chronic pain: Secondary | ICD-10-CM | POA: Diagnosis not present

## 2014-11-10 LAB — CBC WITH DIFFERENTIAL/PLATELET
Basophils Absolute: 0 10*3/uL (ref 0.0–0.1)
Basophils Relative: 1 % (ref 0–1)
Eosinophils Absolute: 0.1 10*3/uL (ref 0.0–0.7)
Eosinophils Relative: 1 % (ref 0–5)
HEMATOCRIT: 47.4 % (ref 39.0–52.0)
Hemoglobin: 15.8 g/dL (ref 13.0–17.0)
Lymphocytes Relative: 42 % (ref 12–46)
Lymphs Abs: 2.6 10*3/uL (ref 0.7–4.0)
MCH: 30.2 pg (ref 26.0–34.0)
MCHC: 33.3 g/dL (ref 30.0–36.0)
MCV: 90.6 fL (ref 78.0–100.0)
Monocytes Absolute: 0.6 10*3/uL (ref 0.1–1.0)
Monocytes Relative: 10 % (ref 3–12)
Neutro Abs: 2.9 10*3/uL (ref 1.7–7.7)
Neutrophils Relative %: 46 % (ref 43–77)
PLATELETS: 129 10*3/uL — AB (ref 150–400)
RBC: 5.23 MIL/uL (ref 4.22–5.81)
RDW: 12.6 % (ref 11.5–15.5)
WBC: 6.1 10*3/uL (ref 4.0–10.5)

## 2014-11-10 LAB — URINALYSIS, ROUTINE W REFLEX MICROSCOPIC
BILIRUBIN URINE: NEGATIVE
GLUCOSE, UA: NEGATIVE mg/dL
Hgb urine dipstick: NEGATIVE
KETONES UR: NEGATIVE mg/dL
Leukocytes, UA: NEGATIVE
Nitrite: NEGATIVE
PH: 6 (ref 5.0–8.0)
Protein, ur: NEGATIVE mg/dL
Specific Gravity, Urine: 1.015 (ref 1.005–1.030)
Urobilinogen, UA: 1 mg/dL (ref 0.0–1.0)

## 2014-11-10 LAB — COMPREHENSIVE METABOLIC PANEL
ALK PHOS: 63 U/L (ref 39–117)
ALT: 171 U/L — AB (ref 0–53)
AST: 126 U/L — ABNORMAL HIGH (ref 0–37)
Albumin: 3.9 g/dL (ref 3.5–5.2)
Anion gap: 17 — ABNORMAL HIGH (ref 5–15)
BUN: 15 mg/dL (ref 6–23)
CALCIUM: 9.7 mg/dL (ref 8.4–10.5)
CO2: 23 meq/L (ref 19–32)
Chloride: 98 mEq/L (ref 96–112)
Creatinine, Ser: 0.82 mg/dL (ref 0.50–1.35)
GFR, EST NON AFRICAN AMERICAN: 89 mL/min — AB (ref >90)
Glucose, Bld: 102 mg/dL — ABNORMAL HIGH (ref 70–99)
Potassium: 3.8 mEq/L (ref 3.7–5.3)
SODIUM: 138 meq/L (ref 137–147)
Total Bilirubin: 0.7 mg/dL (ref 0.3–1.2)
Total Protein: 9 g/dL — ABNORMAL HIGH (ref 6.0–8.3)

## 2014-11-10 LAB — I-STAT TROPONIN, ED: Troponin i, poc: 0 ng/mL (ref 0.00–0.08)

## 2014-11-10 MED ORDER — ALPRAZOLAM 0.5 MG PO TABS: 0.5000 mg | ORAL_TABLET | Freq: Every evening | ORAL | Status: DC | PRN

## 2014-11-10 NOTE — ED Notes (Signed)
[facility] , pt. also reported epistaxis at right nare - no bleeding at arrival.

## 2014-11-10 NOTE — ED Notes (Signed)
EKG done at 0752

## 2014-11-10 NOTE — Telephone Encounter (Signed)
Patient states he needs a medication refill on ALPRAZolam Prudy Feeler(XANAX) 0.5 MG tablet [[id number]][[id number]] sent to the pharmacy on [facility] on [address]. Please advise at [phone].

## 2014-11-10 NOTE — Discharge Instructions (Signed)
Continue your regular medications. Follow up with your doctor for recheck.    Hypertension Hypertension, commonly called high blood pressure, is when the force of blood pumping through your arteries is too strong. Your arteries are the blood vessels that carry blood from your heart throughout your body. A blood pressure reading consists of a higher number over a lower number, such as 110/72. The higher number (systolic) is the pressure inside your arteries when your heart pumps. The lower number (diastolic) is the pressure inside your arteries when your heart relaxes. Ideally you want your blood pressure below 120/80. Hypertension forces your heart to work harder to pump blood. Your arteries may become narrow or stiff. Having hypertension puts you at risk for heart disease, stroke, and other problems.  RISK FACTORS Some risk factors for high blood pressure are controllable. Others are not.  Risk factors you cannot control include:   Race. You may be at higher risk if you are African American.  Age. Risk increases with age.  Gender. Men are at higher risk than women before age 67 years. After age 465, women are at higher risk than men. Risk factors you can control include:  Not getting enough exercise or physical activity.  Being overweight.  Getting too much fat, sugar, calories, or salt in your diet.  Drinking too much alcohol. SIGNS AND SYMPTOMS Hypertension does not usually cause signs or symptoms. Extremely high blood pressure (hypertensive crisis) may cause headache, anxiety, shortness of breath, and nosebleed. DIAGNOSIS  To check if you have hypertension, your health care provider will measure your blood pressure while you are seated, with your arm held at the level of your heart. It should be measured at least twice using the same arm. Certain conditions can cause a difference in blood pressure between your right and left arms. A blood pressure reading that is higher than normal on  one occasion does not mean that you need treatment. If one blood pressure reading is high, ask your health care provider about having it checked again. TREATMENT  Treating high blood pressure includes making lifestyle changes and possibly taking medicine. Living a healthy lifestyle can help lower high blood pressure. You may need to change some of your habits. Lifestyle changes may include:  Following the DASH diet. This diet is high in fruits, vegetables, and whole grains. It is low in salt, red meat, and added sugars.  Getting at least 2 hours of brisk physical activity every week.  Losing weight if necessary.  Not smoking.  Limiting alcoholic beverages.  Learning ways to reduce stress. If lifestyle changes are not enough to get your blood pressure under control, your health care provider may prescribe medicine. You may need to take more than one. Work closely with your health care provider to understand the risks and benefits. HOME CARE INSTRUCTIONS  Have your blood pressure rechecked as directed by your health care provider.   Take medicines only as directed by your health care provider. Follow the directions carefully. Blood pressure medicines must be taken as prescribed. The medicine does not work as well when you skip doses. Skipping doses also puts you at risk for problems.   Do not smoke.   Monitor your blood pressure at home as directed by your health care provider. SEEK MEDICAL CARE IF:   You think you are having a reaction to medicines taken.  You have recurrent headaches or feel dizzy.  You have swelling in your ankles.  You have trouble with your  vision. SEEK IMMEDIATE MEDICAL CARE IF:  You develop a severe headache or confusion.  You have unusual weakness, numbness, or feel faint.  You have severe chest or abdominal pain.  You vomit repeatedly.  You have trouble breathing. MAKE SURE YOU:   Understand these instructions.  Will watch your  condition.  Will get help right away if you are not doing well or get worse. Document Released: 11/15/2005 Document Revised: 04/01/2014 Document Reviewed: 09/07/2013 [facility] Patient Information 2015 [employer] [place]. This information is not intended to replace advice given to you by your health care provider. Make sure you discuss any questions you have with your health care provider.

## 2014-11-10 NOTE — ED Provider Notes (Signed)
CSN: [id number]     Arrival date & time 11/10/14  0049 History   First MD Initiated Contact with Patient 11/10/14 0559     Chief Complaint  Patient presents with  . Hypertension  . Epistaxis     (Consider location/radiation/quality/duration/timing/severity/associated sxs/prior Treatment) HPI [name] is a 68 y.o. male with history of hypertension, coronary disease, presents to emergency department complaining of high blood pressure and nosebleed. [facility] and states his blood pressure there was 210/107. States he also developed a nosebleed. He came to emergency department for evaluation. He denies any headache, chest pain, shortness of breath, reports mild swelling in extremities, reports dizziness earlier which now resolved. He states he takes metoprolol for his blood pressure and has been taking it daily. He admits to eating pork chops earlier and states that he does have some stressors. Patient also reports lower back pain which is chronic.  Past Medical History  Diagnosis Date  . Hypertension   . Coronary artery disease   . High cholesterol   . Anxiety   . Panic attacks   . Anginal pain 01/2005  . History of bronchitis     "used to get it alot" (08/25/2012)  . Arthritis     "knees"  . Chronic lower back pain   . PTSD (post-traumatic stress disorder)     "have been treated in the past" (08/25/2012)  . Depression    Past Surgical History  Procedure Laterality Date  . Coronary artery bypass graft  01/2005    CABG X3  . Cardiac catheterization  01/2005  . Varicose vein surgery  1980's    LLE  . Shrapnel  1969    LLE; left lateral thumb (required grafting)   Family History  Problem Relation Age of Onset  . Diabetes Mother   . Heart disease Father   . Cancer Brother    History  Substance Use Topics  . Smoking status: Former Smoker -- 2.00 packs/day for 5 years  . Smokeless tobacco: Never Used     Comment:  08/25/2012 "quit smoking cigarettes 40 yr ago"  . Alcohol Use: Yes     Comment: social    Review of Systems  Constitutional: Negative for fever and chills.  HENT: Positive for nosebleeds.   Respiratory: Negative for cough, chest tightness and shortness of breath.   Cardiovascular: Positive for leg swelling. Negative for chest pain and palpitations.  Gastrointestinal: Negative for nausea, vomiting, abdominal pain, diarrhea and abdominal distention.  Genitourinary: Negative for dysuria, urgency, frequency and hematuria.  Musculoskeletal: Positive for back pain. Negative for myalgias, arthralgias, neck pain and neck stiffness.  Skin: Negative for rash.  Allergic/Immunologic: Negative for immunocompromised state.  Neurological: Positive for dizziness and light-headedness. Negative for weakness, numbness and headaches.  All other systems reviewed and are negative.     Allergies  Codeine and Vicodin  Home Medications   Prior to Admission medications   Medication Sig Start Date End Date Taking? Authorizing Provider  ALPRAZolam Prudy Feeler(XANAX) 0.5 MG tablet Take 0.5 mg by mouth 3 (three) times daily as needed for anxiety.    Yes Historical Provider, MD  aspirin EC 81 MG tablet Take 81 mg by mouth daily.   Yes Historical Provider, MD  metoprolol succinate (TOPROL-XL) 25 MG 24 hr tablet Take 25 mg by mouth.   Yes Historical Provider, MD   BP 156/72 mmHg  Pulse 68  Temp(Src) 98 F (36.7 C) (Oral)  Resp 15  SpO2 98% Physical Exam  Constitutional: He is oriented to person, place, and time. He appears well-developed and well-nourished. No distress.  HENT:  Head: Normocephalic and atraumatic.  Eyes: Conjunctivae and EOM are normal. Pupils are equal, round, and reactive to light.  Neck: Neck supple.  Cardiovascular: Normal rate, regular rhythm and normal heart sounds.   Pulmonary/Chest: Effort normal. No respiratory distress. He has no wheezes. He has no rales.  Abdominal: Soft. Bowel sounds are  normal. He exhibits no distension. There is no tenderness. There is no rebound.  Musculoskeletal:  Trace edema lower extremities bilaterally  Neurological: He is alert and oriented to person, place, and time. No cranial nerve deficit.  Skin: Skin is warm and dry.  Nursing note and vitals reviewed.   ED Course  Procedures (including critical care time) Labs Review Labs Reviewed  CBC WITH DIFFERENTIAL - Abnormal; Notable for the following:    Platelets 129 (*)    All other components within normal limits  COMPREHENSIVE METABOLIC PANEL - Abnormal; Notable for the following:    Glucose, Bld 102 (*)    Total Protein 9.0 (*)    AST 126 (*)    ALT 171 (*)    GFR calc non Af Amer 89 (*)    Anion gap 17 (*)    All other components within normal limits  URINALYSIS, ROUTINE W REFLEX MICROSCOPIC  I-STAT TROPOININ, ED    Imaging Review No results found.   EKG Interpretation   Date/Time:  Sunday November 10 2014 07:39:21 EST Ventricular Rate:  69 PR Interval:  192 QRS Duration: 88 QT Interval:  411 QTC Calculation: 440 R Axis:   78 Text Interpretation:  Sinus rhythm Borderline T abnormalities, anterior  leads No acute findings Confirmed by Rhunette CroftNANAVATI, MD, ANKIT [phone]([id number]) on  11/10/2014 7:51:50 AM      MDM   Final diagnoses:  Secondary hypertension, unspecified    Patient has been in emergency department for 5 hours prior to me seeing him. His blood pressure is now normal. He has no nosebleed at present. His initial blood pressure on presentation was 199/83. He states he is feeling better at this time. Will add troponin, ua  7:32 AM Troponin and UA negative. Blood pressure continues to be borderline normal. Patient is feeling well at this time. Will discharge home with outpatient follow-up. LFTs elevated, chronic. Discussed with Dr. Norlene Campbelltter who agrees with the plan. Home with follow up.   Filed Vitals:   11/10/14 0545 11/10/14 0600 11/10/14 0615 11/10/14 0743  BP: 137/72 140/66  156/72 160/80  Pulse: 67 64 68 70  Temp:    97.8 F (36.6 C)  TempSrc:    Oral  Resp:    20  SpO2: 96% 97% 98% 98%     Lottie Musselatyana A Eddye Broxterman, PA-C 11/10/14 0827  Olivia Mackielga M Otter, MD 11/10/14 [phone]

## 2014-11-12 MED ORDER — ALPRAZOLAM 0.5 MG PO TABS: 0.5000 mg | ORAL_TABLET | Freq: Three times a day (TID) | ORAL | Status: DC | PRN

## 2014-11-17 NOTE — Telephone Encounter (Signed)
Pt is checking on status of his xanax request on 11/10/14

## 2014-11-18 NOTE — Telephone Encounter (Signed)
Pt received script for Xanax it is at the pharmacy now. Pt received a call today.

## 2014-11-18 NOTE — Telephone Encounter (Signed)
This was done, ph mes 11/10/14.

## 2014-11-18 NOTE — Telephone Encounter (Signed)
Printed previously

## 2014-12-14 ENCOUNTER — Emergency Department (HOSPITAL_COMMUNITY)
Admission: EM | Admit: 2014-12-14 | Discharge: 2014-12-14 | Disposition: A | Discharge status: Home / Self Care | Payer: Medicare Other | Attending: Emergency Medicine | Admitting: Emergency Medicine

## 2014-12-14 ENCOUNTER — Emergency Department (HOSPITAL_COMMUNITY): Payer: Medicare Other

## 2014-12-14 ENCOUNTER — Encounter (HOSPITAL_COMMUNITY): Payer: Self-pay | Admitting: *Deleted

## 2014-12-14 DIAGNOSIS — Z79899 Other long term (current) drug therapy: Secondary | ICD-10-CM | POA: Diagnosis not present

## 2014-12-14 DIAGNOSIS — F329 Major depressive disorder, single episode, unspecified: Secondary | ICD-10-CM | POA: Insufficient documentation

## 2014-12-14 DIAGNOSIS — M1990 Unspecified osteoarthritis, unspecified site: Secondary | ICD-10-CM | POA: Diagnosis not present

## 2014-12-14 DIAGNOSIS — G8929 Other chronic pain: Secondary | ICD-10-CM | POA: Diagnosis not present

## 2014-12-14 DIAGNOSIS — Z87891 Personal history of nicotine dependence: Secondary | ICD-10-CM | POA: Diagnosis not present

## 2014-12-14 DIAGNOSIS — I25110 Atherosclerotic heart disease of native coronary artery with unstable angina pectoris: Secondary | ICD-10-CM | POA: Insufficient documentation

## 2014-12-14 DIAGNOSIS — R509 Fever, unspecified: Secondary | ICD-10-CM

## 2014-12-14 DIAGNOSIS — I10 Essential (primary) hypertension: Secondary | ICD-10-CM | POA: Diagnosis not present

## 2014-12-14 DIAGNOSIS — Z7982 Long term (current) use of aspirin: Secondary | ICD-10-CM | POA: Insufficient documentation

## 2014-12-14 DIAGNOSIS — F419 Anxiety disorder, unspecified: Secondary | ICD-10-CM | POA: Diagnosis not present

## 2014-12-14 DIAGNOSIS — J984 Other disorders of lung: Secondary | ICD-10-CM | POA: Diagnosis not present

## 2014-12-14 LAB — COMPREHENSIVE METABOLIC PANEL
ALBUMIN: 3.9 g/dL (ref 3.5–5.2)
ALK PHOS: 57 U/L (ref 39–117)
ALT: 167 U/L — ABNORMAL HIGH (ref 0–53)
AST: 137 U/L — AB (ref 0–37)
Anion gap: 13 (ref 5–15)
BUN: 16 mg/dL (ref 6–23)
CALCIUM: 8.9 mg/dL (ref 8.4–10.5)
CHLORIDE: 98 meq/L (ref 96–112)
CO2: 21 mmol/L (ref 19–32)
CREATININE: 0.97 mg/dL (ref 0.50–1.35)
GFR calc Af Amer: >90 mL/min (ref >90)
GFR, EST NON AFRICAN AMERICAN: 84 mL/min — AB (ref >90)
GLUCOSE: 127 mg/dL — AB (ref 70–99)
POTASSIUM: 4.1 mmol/L (ref 3.5–5.1)
Sodium: 132 mmol/L — ABNORMAL LOW (ref 135–145)
Total Bilirubin: 1 mg/dL (ref 0.3–1.2)
Total Protein: 8.6 g/dL — ABNORMAL HIGH (ref 6.0–8.3)

## 2014-12-14 LAB — CBC WITH DIFFERENTIAL/PLATELET
Basophils Absolute: 0 10*3/uL (ref 0.0–0.1)
Basophils Relative: 0 % (ref 0–1)
Eosinophils Absolute: 0 10*3/uL (ref 0.0–0.7)
Eosinophils Relative: 0 % (ref 0–5)
HCT: 44.6 % (ref 39.0–52.0)
HEMOGLOBIN: 15.4 g/dL (ref 13.0–17.0)
LYMPHS ABS: 1.5 10*3/uL (ref 0.7–4.0)
Lymphocytes Relative: 21 % (ref 12–46)
MCH: 31 pg (ref 26.0–34.0)
MCHC: 34.5 g/dL (ref 30.0–36.0)
MCV: 89.9 fL (ref 78.0–100.0)
MONO ABS: 0.4 10*3/uL (ref 0.1–1.0)
MONOS PCT: 6 % (ref 3–12)
NEUTROS PCT: 73 % (ref 43–77)
Neutro Abs: 5.2 10*3/uL (ref 1.7–7.7)
PLATELETS: 107 10*3/uL — AB (ref 150–400)
RBC: 4.96 MIL/uL (ref 4.22–5.81)
RDW: 12.7 % (ref 11.5–15.5)
WBC: 7.1 10*3/uL (ref 4.0–10.5)

## 2014-12-14 LAB — URINALYSIS, ROUTINE W REFLEX MICROSCOPIC
Bilirubin Urine: NEGATIVE
Glucose, UA: NEGATIVE mg/dL
HGB URINE DIPSTICK: NEGATIVE
Ketones, ur: NEGATIVE mg/dL
LEUKOCYTES UA: NEGATIVE
NITRITE: NEGATIVE
PH: 6.5 (ref 5.0–8.0)
Protein, ur: NEGATIVE mg/dL
Specific Gravity, Urine: 1.021 (ref 1.005–1.030)

## 2014-12-14 LAB — I-STAT CG4 LACTIC ACID, ED: Lactic Acid, Venous: 2.89 mmol/L — ABNORMAL HIGH (ref 0.5–2.2)

## 2014-12-14 MED ORDER — ACETAMINOPHEN 325 MG PO TABS
650.0000 mg | ORAL_TABLET | Freq: Once | ORAL | Status: AC
  Administered 2014-12-14: 650 mg via ORAL
  Filled 2014-12-14: qty 2

## 2014-12-14 MED ORDER — ACETAMINOPHEN 500 MG PO TABS: 500.0000 mg | ORAL_TABLET | Freq: Four times a day (QID) | ORAL | Status: DC | PRN

## 2014-12-14 MED ORDER — IBUPROFEN 200 MG PO TABS
400.0000 mg | ORAL_TABLET | Freq: Once | ORAL | Status: AC
  Administered 2014-12-14: 400 mg via ORAL
  Filled 2014-12-14: qty 2

## 2014-12-14 MED ORDER — IBUPROFEN 200 MG PO TABS: 600.0000 mg | ORAL_TABLET | Freq: Once | ORAL | Status: DC

## 2014-12-14 MED ORDER — SODIUM CHLORIDE 0.9 % IV BOLUS (SEPSIS)
2000.0000 mL | Freq: Once | INTRAVENOUS | Status: AC
  Administered 2014-12-14: 2000 mL via INTRAVENOUS

## 2014-12-14 NOTE — ED Notes (Signed)
Phlebotomy at bedside.

## 2014-12-14 NOTE — ED Provider Notes (Signed)
CSN: [id number]     Arrival date & time 12/14/14  1913 History   First MD Initiated Contact with Patient 12/14/14 2044     Chief Complaint  Patient presents with  . Fever    (Consider location/radiation/quality/duration/timing/severity/associated sxs/prior Treatment) HPI Comments: Patient is a 69 y/o male with a hx of CAD, s/p CABG x 3 in 2006, HTN, HLD, and chronic low back pain, who presents to the ED for further evaluation of fever. Patient states that he began to feel chills while at work today as well as an overwhelming fatigued sensation. Patient states "Everyone in my office was feeling fine, but I felt really cold and needed to keep my jacket on". 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 He states that he was "running some numbers at work and they were all wrong"; that's when he realized he wasn't feeling well. Patient took 2 tablets 325 mg aspirin at 1630 for fever. He reports he has felt some mild chest congestion and experienced watery diarrhea mixed with brown stool over the past 2 days. He denies associated myalgias, chest pain, cough, shortness of breath, rashes or areas of skin redness, nausea, vomiting, syncope, dysuria or hematuria, vomiting, nausea, melena or hematochezia. He denies getting his flu shot this year.  Patient is a 69 y.o. male presenting with fever. The history is provided by the patient. No language interpreter was used.  Fever Associated symptoms: diarrhea   Associated symptoms: no chest pain, no congestion, no cough, no dysuria, no myalgias, no nausea, no rhinorrhea and no vomiting     Past Medical History  Diagnosis Date  . Hypertension   . Coronary artery disease   . High cholesterol   . Anxiety   . Panic attacks   . Anginal pain 01/2005  . History of bronchitis     "used to get it alot" (08/25/2012)  . Arthritis     "knees"  . Chronic lower back pain   . PTSD (post-traumatic stress disorder)     "have  been treated in the past" (08/25/2012)  . Depression    Past Surgical History  Procedure Laterality Date  . Coronary artery bypass graft  01/2005    CABG X3  . Cardiac catheterization  01/2005  . Varicose vein surgery  1980's    LLE  . Shrapnel  1969    LLE; left lateral thumb (required grafting)   Family History  Problem Relation Age of Onset  . Diabetes Mother   . Heart disease Father   . Cancer Brother    History  Substance Use Topics  . Smoking status: Former Smoker -- 2.00 packs/day for 5 years  . Smokeless tobacco: Never Used     Comment: 08/25/2012 "quit smoking cigarettes 40 yr ago"  . Alcohol Use: Yes     Comment: social    Review of Systems  Constitutional: Positive for fever and fatigue.  HENT: Negative for congestion and rhinorrhea.   Respiratory: Negative for cough and shortness of breath.   Cardiovascular: Negative for chest pain.  Gastrointestinal: Positive for diarrhea. Negative for nausea, vomiting and abdominal pain.  Genitourinary: Negative for dysuria and hematuria.  Musculoskeletal: Negative for myalgias.  Neurological: Negative for syncope.  All other systems reviewed and are negative.   Allergies  Codeine and Vicodin  Home Medications   Prior to Admission medications   Medication Sig Start Date End Date Taking? Authorizing Provider  ALPRAZolam Prudy Feeler(XANAX) 0.5 MG tablet  Take 1 tablet (0.5 mg total) by mouth 3 (three) times daily as needed for anxiety. 11/12/14  Yes Carmelina Dane, MD  aspirin 325 MG EC tablet Take 325 mg by mouth daily.   Yes Historical Provider, MD  metoprolol succinate (TOPROL-XL) 25 MG 24 hr tablet Take 25 mg by mouth.   Yes Historical Provider, MD  acetaminophen (TYLENOL) 500 MG tablet Take 1 tablet (500 mg total) by mouth every 6 (six) hours as needed for fever. 12/14/14   Antony Madura, PA-C  ALPRAZolam Prudy Feeler) 0.5 MG tablet Take 1 tablet (0.5 mg total) by mouth at bedtime as needed for anxiety. 11/10/14   Tatyana A Kirichenko,  PA-C  aspirin EC 81 MG tablet Take 81 mg by mouth daily.    Historical Provider, MD   BP 146/62 mmHg  Pulse 79  Temp(Src) 100.1 F (37.8 C) (Oral)  Resp 20  SpO2 97%   Physical Exam  Constitutional: He is oriented to person, place, and time. He appears well-developed and well-nourished. No distress.  Nontoxic/nonseptic appearing  HENT:  Head: Normocephalic and atraumatic.  Right Ear: Tympanic membrane, external ear and ear canal normal.  Left Ear: Tympanic membrane, external ear and ear canal normal.  Mouth/Throat: Oropharynx is clear and moist. No oropharyngeal exudate.  Eyes: Conjunctivae and EOM are normal. Pupils are equal, round, and reactive to light. No scleral icterus.  Neck: Normal range of motion.  No nuchal rigidity or meningismus  Cardiovascular: Normal rate, regular rhythm and intact distal pulses.   Pulmonary/Chest: Effort normal and breath sounds normal. No respiratory distress. He has no wheezes. He has no rales.  Respirations even and unlabored. Lungs clear bilaterally.  Abdominal: Soft. He exhibits no distension. There is tenderness (mild). There is no rebound and no guarding.  Soft distended abdomen with normoactive bowel sounds. Patient reports tenderness on palpation in his right lower abdomen without outward signs of tenderness such as wincing or voluntary guarding. No peritoneal signs.  Musculoskeletal: Normal range of motion.  Neurological: He is alert and oriented to person, place, and time. He exhibits normal muscle tone. Coordination normal.  GCS 15. Speech is goal oriented. Patient moves extremities without ataxia.  Skin: Skin is warm and dry. No rash noted. He is not diaphoretic. No erythema. No pallor.  Psychiatric: He has a normal mood and affect. His behavior is normal.  Nursing note and vitals reviewed.   ED Course  Procedures (including critical care time) Labs Review Labs Reviewed  CBC WITH DIFFERENTIAL - Abnormal; Notable for the following:     Platelets 107 (*)    All other components within normal limits  COMPREHENSIVE METABOLIC PANEL - Abnormal; Notable for the following:    Sodium 132 (*)    Glucose, Bld 127 (*)    Total Protein 8.6 (*)    AST 137 (*)    ALT 167 (*)    GFR calc non Af Amer 84 (*)    All other components within normal limits  URINALYSIS, ROUTINE W REFLEX MICROSCOPIC - Abnormal; Notable for the following:    Color, Urine AMBER (*)    Urobilinogen, UA >8.0 (*)    All other components within normal limits  I-STAT CG4 LACTIC ACID, ED - Abnormal; Notable for the following:    Lactic Acid, Venous 2.89 (*)    All other components within normal limits  URINE CULTURE  CULTURE, BLOOD (ROUTINE X 2)  CULTURE, BLOOD (ROUTINE X 2)  INFLUENZA PANEL BY PCR (TYPE A & B,  H1N1)    Imaging Review Dg Chest 2 View  12/14/2014   CLINICAL DATA:  One day history of fever and chills. Solitary episode of disorientation earlier today. Prior CABG. Current history of hypertension.  EXAM: CHEST  2 VIEW  COMPARISON:  07/04/2014 dating back to 04/14/2010.  FINDINGS: Sternotomy for CABG. Cardiac silhouette upper normal in size, unchanged. Hilar and mediastinal contours otherwise unremarkable. Linear scarring in the right middle lobe and lingula wing, unchanged. Lungs otherwise clear. No localized airspace consolidation. No pleural effusions. No pneumothorax. Normal pulmonary vascularity. Visualized bony thorax intact.  IMPRESSION: No acute cardiopulmonary disease. Stable scarring in the lingula and right middle lobe.   Electronically Signed   By: Hulan Saas M.D.   On: 12/14/2014 20:18     EKG Interpretation None      MDM   Final diagnoses:  Fever, unspecified fever cause    2100 - Patient presenting to the ED for fever. Symptoms associated with diarrhea and mild chest congestion. No other symptoms. Blood cultures, urine culture and lactate added. Will hydrate with IVF.  2130 - Nurse attempting IV; patient reportedly a  hard stick.  2145 - IVF hung. Patient's fever responding to Tylenol.  2320 - Patient complaining that he was using the call bell numerous times and no one responded. He states that he voided on himself as a result of this. Patient upset that his new nurse is no longer paying him much attention. He states "she was just sitting there at the counter and paying me no mind". I apologized to the patient numerous times for his unpleasant experience. I helped to make the patient a new bed and patient was provided a new gown by the nursing tech.  2340 - the patient's temperature has remained fairly stable since 2200. He has been given an additional dose of ibuprofen for further management of his fever. Patient has no leukocytosis today. His thrombocytopenia is at baseline. He has a normal anion gap. LFTs are also at baseline. Kidney function preserved. Urinalysis does not suggest infection and chest x-ray is negative for focal consolidation or pneumonia. His abdominal reexamination is stable without evidence of focal tenderness. Suspect that symptoms and fever today are secondary to a viral process. Do not believe further emergent workup is indicated. Have discussed the use of antipyretics for fever control. Patient continues to be concerned that his fever today is the result of Ebola. I have attempted to reassure the patient that the likelihood of a bowl is extremely low. I have discussed that I have no clinical suspicion for this and have also reiterated that there have been no documented cases of Ebola in the [place] for >2 months. I have recommended continued rest, fluid hydration, and antipyretic use as well as PCP follow-up in 2 days. Patient discharged in good condition.   Antony Madura, PA-C 12/15/14 0017  Ethelda Chick, MD 12/15/14 [phone]

## 2014-12-14 NOTE — ED Notes (Signed)
The pt reports that he has no problem with plain tylenol

## 2014-12-14 NOTE — Discharge Instructions (Signed)
Your fever today is likely the result of a viral process. You need to drink plenty of fluids and take Tylenol as prescribed for fever. You may take 600mg  ibuprofen every 6 hours for fever as needed as well. Follow up with your doctor for further evaluation of symptoms. Return, as needed, if symptoms worsen.  Fever, Adult A fever is a higher than normal body temperature. In an adult, an oral temperature around 98.6 F (37 C) is considered normal. A temperature of 100.4 F (38 C) or higher is generally considered a fever. Mild or moderate fevers generally have no long-term effects and often do not require treatment. Extreme fever (greater than or equal to 106 F or 41.1 C) can cause seizures. The sweating that may occur with repeated or prolonged fever may cause dehydration. Elderly people can develop confusion during a fever. A measured temperature can vary with:  Age.  Time of day.  Method of measurement (mouth, underarm, rectal, or ear). The fever is confirmed by taking a temperature with a thermometer. Temperatures can be taken different ways. Some methods are accurate and some are not.  An oral temperature is used most commonly. Electronic thermometers are fast and accurate.  An ear temperature will only be accurate if the thermometer is positioned as recommended by the manufacturer.  A rectal temperature is accurate and done for those adults who have a condition where an oral temperature cannot be taken.  An underarm (axillary) temperature is not accurate and not recommended. Fever is a symptom, not a disease.  CAUSES   Infections commonly cause fever.  Some noninfectious causes for fever include:  Some arthritis conditions.  Some thyroid or adrenal gland conditions.  Some immune system conditions.  Some types of cancer.  A medicine reaction.  High doses of certain street drugs such as methamphetamine.  Dehydration.  Exposure to high outside or room  temperatures.  Occasionally, the source of a fever cannot be determined. This is sometimes called a "fever of unknown origin" (FUO).  Some situations may lead to a temporary rise in body temperature that may go away on its own. Examples are:  Childbirth.  Surgery.  Intense exercise. HOME CARE INSTRUCTIONS   Take appropriate medicines for fever. Follow dosing instructions carefully. If you use acetaminophen to reduce the fever, be careful to avoid taking other medicines that also contain acetaminophen. Do not take aspirin for a fever if you are younger than age 68. There is an association with Reye's syndrome. Reye's syndrome is a rare but potentially deadly disease.  If an infection is present and antibiotics have been prescribed, take them as directed. Finish them even if you start to feel better.  Rest as needed.  Maintain an adequate fluid intake. To prevent dehydration during an illness with prolonged or recurrent fever, you may need to drink extra fluid.Drink enough fluids to keep your urine clear or pale yellow.  Sponging or bathing with room temperature water may help reduce body temperature. Do not use ice water or alcohol sponge baths.  Dress comfortably, but do not over-bundle. SEEK MEDICAL CARE IF:   You are unable to keep fluids down.  You develop vomiting or diarrhea.  You are not feeling at least partly better after 3 days.  You develop new symptoms or problems. SEEK IMMEDIATE MEDICAL CARE IF:   You have shortness of breath or trouble breathing.  You develop excessive weakness.  You are dizzy or you faint.  You are extremely thirsty or you are  making little or no urine.  You develop new pain that was not there before (such as in the head, neck, chest, back, or abdomen).  You have persistent vomiting and diarrhea for more than 1 to 2 days.  You develop a stiff neck or your eyes become sensitive to light.  You develop a skin rash.  You have a fever or  persistent symptoms for more than 2 to 3 days.  You have a fever and your symptoms suddenly get worse. MAKE SURE YOU:   Understand these instructions.  Will watch your condition.  Will get help right away if you are not doing well or get worse. Document Released: 05/11/2001 Document Revised: 04/01/2014 Document Reviewed: 09/16/2011 [facility] Patient Information 2015 [place], [place]. This information is not intended to replace advice given to you by your health care provider. Make sure you discuss any questions you have with your health care provider.

## 2014-12-14 NOTE — ED Notes (Signed)
MD Linker at bedside.  

## 2014-12-14 NOTE — ED Notes (Signed)
I Stat Lactic Acid results shown to Kelly Humes PA 

## 2014-12-14 NOTE — ED Notes (Signed)
The pt  Has had a fever  All day at work.  He has had 2 aspirin 325mg  at 1830.  No pain anywhere

## 2014-12-15 LAB — INFLUENZA PANEL BY PCR (TYPE A & B)
H1N1 flu by pcr: NOT DETECTED
INFLBPCR: NEGATIVE
Influenza A By PCR: NEGATIVE

## 2014-12-16 ENCOUNTER — Telehealth (HOSPITAL_COMMUNITY): Payer: Self-pay

## 2014-12-16 LAB — URINE CULTURE
COLONY COUNT: NO GROWTH
CULTURE: NO GROWTH

## 2014-12-16 NOTE — Telephone Encounter (Signed)
Pt calling for lab results.  Informed Flu (-) and urine and blood cx still in process.

## 2014-12-17 ENCOUNTER — Telehealth (HOSPITAL_BASED_OUTPATIENT_CLINIC_OR_DEPARTMENT_OTHER): Payer: Self-pay | Admitting: Emergency Medicine

## 2014-12-18 ENCOUNTER — Other Ambulatory Visit: Payer: Self-pay | Admitting: Family Medicine

## 2014-12-18 ENCOUNTER — Telehealth: Payer: Self-pay | Admitting: *Deleted

## 2014-12-18 ENCOUNTER — Ambulatory Visit (INDEPENDENT_AMBULATORY_CARE_PROVIDER_SITE_OTHER): Payer: Medicare Other | Admitting: Family Medicine

## 2014-12-18 VITALS — BP 170/80 | HR 70 | Temp 98.4°F | Resp 18 | Ht 70.0 in | Wt 243.2 lb

## 2014-12-18 DIAGNOSIS — R748 Abnormal levels of other serum enzymes: Secondary | ICD-10-CM | POA: Diagnosis not present

## 2014-12-18 DIAGNOSIS — R509 Fever, unspecified: Secondary | ICD-10-CM | POA: Diagnosis not present

## 2014-12-18 DIAGNOSIS — R1084 Generalized abdominal pain: Secondary | ICD-10-CM | POA: Diagnosis not present

## 2014-12-18 DIAGNOSIS — K529 Noninfective gastroenteritis and colitis, unspecified: Secondary | ICD-10-CM | POA: Diagnosis not present

## 2014-12-18 DIAGNOSIS — F1010 Alcohol abuse, uncomplicated: Secondary | ICD-10-CM

## 2014-12-18 DIAGNOSIS — R197 Diarrhea, unspecified: Secondary | ICD-10-CM

## 2014-12-18 LAB — COMPREHENSIVE METABOLIC PANEL
ALK PHOS: 51 U/L (ref 39–117)
ALT: 127 U/L — AB (ref 0–53)
AST: 100 U/L — ABNORMAL HIGH (ref 0–37)
Albumin: 4.1 g/dL (ref 3.5–5.2)
BUN: 15 mg/dL (ref 6–23)
CALCIUM: 9.4 mg/dL (ref 8.4–10.5)
CO2: 25 mEq/L (ref 19–32)
Chloride: 103 mEq/L (ref 96–112)
Creat: 0.84 mg/dL (ref 0.50–1.35)
Glucose, Bld: 92 mg/dL (ref 70–99)
Potassium: 4 mEq/L (ref 3.5–5.3)
SODIUM: 137 meq/L (ref 135–145)
TOTAL PROTEIN: 8.7 g/dL — AB (ref 6.0–8.3)
Total Bilirubin: 0.7 mg/dL (ref 0.2–1.2)

## 2014-12-18 LAB — POCT CBC
GRANULOCYTE PERCENT: 50.3 % (ref 37–80)
HCT, POC: 47.5 % (ref 43.5–53.7)
Hemoglobin: 14.9 g/dL (ref 14.1–18.1)
Lymph, poc: 2.3 (ref 0.6–3.4)
MCH, POC: 29.7 pg (ref 27–31.2)
MCHC: 31.3 g/dL — AB (ref 31.8–35.4)
MCV: 94.9 fL (ref 80–97)
MID (CBC): 0.3 (ref 0–0.9)
MPV: 8.5 fL (ref 0–99.8)
PLATELET COUNT, POC: 146 10*3/uL (ref 142–424)
POC GRANULOCYTE: 2.7 (ref 2–6.9)
POC LYMPH PERCENT: 43.9 %L (ref 10–50)
POC MID %: 5.8 %M (ref 0–12)
RBC: 5.01 M/uL (ref 4.69–6.13)
RDW, POC: 14 %
WBC: 5.3 10*3/uL (ref 4.6–10.2)

## 2014-12-18 LAB — POCT UA - MICROSCOPIC ONLY
Bacteria, U Microscopic: NEGATIVE
CRYSTALS, UR, HPF, POC: NEGATIVE
Casts, Ur, LPF, POC: NEGATIVE
EPITHELIAL CELLS, URINE PER MICROSCOPY: NEGATIVE
MUCUS UA: NEGATIVE
Yeast, UA: NEGATIVE

## 2014-12-18 LAB — POCT URINALYSIS DIPSTICK
BILIRUBIN UA: NEGATIVE
GLUCOSE UA: NEGATIVE
Ketones, UA: NEGATIVE
LEUKOCYTES UA: NEGATIVE
NITRITE UA: NEGATIVE
RBC UA: NEGATIVE
Spec Grav, UA: 1.02
pH, UA: 6

## 2014-12-18 MED ORDER — DICYCLOMINE HCL 10 MG PO CAPS: 10.0000 mg | ORAL_CAPSULE | Freq: Three times a day (TID) | ORAL | Status: DC

## 2014-12-18 NOTE — Telephone Encounter (Signed)
Pt called to get results of blood cultures.  States he had been having diarrhea since Saturday and was wondering if there was infection in his blood.  NCM read results to pt.  Pt states he will follow up with PCP concerning diarrhea.

## 2014-12-18 NOTE — Progress Notes (Signed)
Subjective: 69 year old man whose had problems since Saturday. He began getting chills when he was at work. He felt a little disoriented and did some paperwork accurately. He went on to the emergency room where they evaluated him. His test did not show anything specific, and they do not give any other medication other than some Tylenol. He has persisted with being ill. Sunday he had profuse watery diarrhea, which still has continued into today with mushy stools a couple times today. He has eaten a little bit of sausage and eggs today. He does have a history of regular vodka intake daily. He has not been drinking much since he got sick. He is married. A grandchild had diarrhea a little bit. He says he has lost 8 pounds through this.  Objective: No acute distress. Throat clear with dentures, mucous membranes moist. TMs normal. Neck supple without significant nodes. Chest clear to auscultation. Heart regular without murmurs. Abdomen has bowel sounds present, soft without organomegaly or masses or tenderness. Normal percussion. Skin turgor seemed a little diminished.  Assessment: Abdominal pain and fever and diarrhea Gastroenteritis Excessive alcohol intake Elevated liver enzymes  Plan: CBC, comp reads of metabolic panel, and urinalysis  Results for orders placed or performed in visit on 12/18/14  POCT CBC  Result Value Ref Range   WBC 5.3 4.6 - 10.2 K/uL   Lymph, poc 2.3 0.6 - 3.4   POC LYMPH PERCENT 43.9 10 - 50 %L   MID (cbc) 0.3 0 - 0.9   POC MID % 5.8 0 - 12 %M   POC Granulocyte 2.7 2 - 6.9   Granulocyte percent 50.3 37 - 80 %G   RBC 5.01 4.69 - 6.13 M/uL   Hemoglobin 14.9 14.1 - 18.1 g/dL   HCT, POC 16.147.5 09.643.5 - 53.7 %   MCV 94.9 80 - 97 fL   MCH, POC 29.7 27 - 31.2 pg   MCHC 31.3 (A) 31.8 - 35.4 g/dL   RDW, POC 04.514.0 %   Platelet Count, POC 146 142 - 424 K/uL   MPV 8.5 0 - 99.8 fL  POCT UA - Microscopic Only  Result Value Ref Range   WBC, Ur, HPF, POC 0-1    RBC, urine,  microscopic 0-1    Bacteria, U Microscopic neg    Mucus, UA neg    Epithelial cells, urine per micros neg    Crystals, Ur, HPF, POC neg    Casts, Ur, LPF, POC neg    Yeast, UA neg   POCT urinalysis dipstick  Result Value Ref Range   Color, UA yellow    Clarity, UA clear    Glucose, UA neg    Bilirubin, UA neg    Ketones, UA neg    Spec Grav, UA 1.020    Blood, UA neg    pH, UA 6.0    Protein, UA trace    Urobilinogen, UA >=8.0    Nitrite, UA neg    Leukocytes, UA Negative     The gastroenteritis should continue to improve. If his bowels have not slowed down by Friday he can begin taking some dicyclomine before meals and at bedtime. He is to return at any time if worse.  I explained to him the concern about the elevated liver enzymes. Advise he abstain from alcohol for 3 or 4 weeks and return for needed to recheck some labs on him.  Return sooner if problems.

## 2014-12-18 NOTE — Patient Instructions (Addendum)
Comments: Please note change in reference range.    Chloride 96 - 112 mEq/L 98 98 102 101 97    CO2 19 - 32 mmol/L 21 23R 22R  23R    Glucose, Bld 70 - 99 mg/dL 960 (H) 454 (H) 098 (H) 104 (H) 115 (H)    BUN 6 - 23 mg/dL Creatinine, Ser 0.50 - 1.35 mg/dL 1.19 1.47 8.29 5.62 1.30    Calcium 8.4 - 10.5 mg/dL 8.9 9.7 9.2  9.8    Total Protein 6.0 - 8.3 g/dL 8.6 (H) 9.0 (H) 8.2      Albumin 3.5 - 5.2 g/dL 3.9 3.9 3.6      AST 0 - 37 U/L 137 (H) 126 (H) 130 (H)CM      ALT 0 - 53 U/L 167 (H) 171 (H) 190 (H)      Alkaline Phosphatase 39 - 117 U/L 57 63 61      Total Bilirubin 0.3 - 1.2 mg/dL 1.0 0.7 0.4        Continue to drink plenty of fluids.  If the diarrhea does not improve a lot more by Friday, begin taking Bentyl (dicyclomine) one pill before meals and at bedtime as needed  Avoid alcohol. Return in 3-4 weeks for me to recheck some labs on you.  Return sooner if worse   Alcohol Use Disorder Alcohol use disorder is a mental disorder. It is not a one-time incident of heavy drinking. Alcohol use disorder is the excessive and uncontrollable use of alcohol over time that leads to problems with functioning in one or more areas of daily living. People with this disorder risk harming themselves and others when they drink to excess. Alcohol use disorder also can cause other mental disorders, such as mood and anxiety disorders, and serious physical problems. People with alcohol use disorder often misuse other drugs.  Alcohol use disorder is common and widespread. Some people with this disorder drink alcohol to cope with or escape from negative life events. Others drink to relieve chronic pain or symptoms of mental illness. People with a family history of alcohol use disorder are at higher risk of losing control and using alcohol to excess.  SYMPTOMS  Signs and symptoms of alcohol use disorder may include the following:   Consumption ofalcohol inlarger amounts or  over a longer period of time than intended.  Multiple unsuccessful attempts to cutdown or control alcohol use.   A great deal of time spent obtaining alcohol, using alcohol, or recovering from the effects of alcohol (hangover).  A strong desire or urge to use alcohol (cravings).   Continued use of alcohol despite problems at work, school, or home because of alcohol use.   Continued use of alcohol despite problems in relationships because of alcohol use.  Continued use of alcohol in situations when it is physically hazardous, such as driving a car.  Continued use of alcohol despite awareness of a physical or psychological problem that is likely related to alcohol use. Physical problems related to alcohol use can involve the brain, heart, liver, stomach, and intestines. Psychological problems related to alcohol use include intoxication, depression, anxiety, psychosis, delirium, and dementia.   The need for increased amounts of alcohol to achieve the same desired effect, or a decreased effect from the consumption of the same amount of alcohol (tolerance).  Withdrawal symptoms upon reducing or stopping alcohol use, or alcohol use to reduce or avoid withdrawal symptoms. Withdrawal symptoms include:  Racing heart.  Hand tremor.  Difficulty sleeping.  Nausea.  Vomiting.  Hallucinations.  Restlessness.  Seizures. DIAGNOSIS Alcohol use disorder is diagnosed through an assessment by your health care provider. Your health care provider may start by asking three or four questions to screen for excessive or problematic alcohol use. To confirm a diagnosis of alcohol use disorder, at least two symptoms must be present within a 8471-month period. The severity of alcohol use disorder depends on the number of symptoms:  Mild--two or three.  Moderate--four or five.  Severe--six or more. Your health care provider may perform a physical exam or use results from lab tests to see if you have  physical problems resulting from alcohol use. Your health care provider may refer you to a mental health professional for evaluation. TREATMENT  Some people with alcohol use disorder are able to reduce their alcohol use to low-risk levels. Some people with alcohol use disorder need to quit drinking alcohol. When necessary, mental health professionals with specialized training in substance use treatment can help. Your health care provider can help you decide how severe your alcohol use disorder is and what type of treatment you need. The following forms of treatment are available:   Detoxification. Detoxification involves the use of prescription medicines to prevent alcohol withdrawal symptoms in the first week after quitting. This is important for people with a history of symptoms of withdrawal and for heavy drinkers who are likely to have withdrawal symptoms. Alcohol withdrawal can be dangerous and, in severe cases, cause death. Detoxification is usually provided in a hospital or in-patient substance use treatment facility.  Counseling or talk therapy. Talk therapy is provided by substance use treatment counselors. It addresses the reasons people use alcohol and ways to keep them from drinking again. The goals of talk therapy are to help people with alcohol use disorder find healthy activities and ways to cope with life stress, to identify and avoid triggers for alcohol use, and to handle cravings, which can cause relapse.  Medicines.Different medicines can help treat alcohol use disorder through the following actions:  Decrease alcohol cravings.  Decrease the positive reward response felt from alcohol use.  Produce an uncomfortable physical reaction when alcohol is used (aversion therapy).  Support groups. Support groups are run by people who have quit drinking. They provide emotional support, advice, and guidance. These forms of treatment are often combined. Some people with alcohol use disorder  benefit from intensive combination treatment provided by specialized substance use treatment centers. Both inpatient and outpatient treatment programs are available. Document Released: 12/23/2004 Document Revised: 04/01/2014 Document Reviewed: 02/22/2013 [facility] Patient Information 2015 [place], [place]. This information is not intended to replace advice given to you by your health care provider. Make sure you discuss any questions you have with your health care provider.

## 2014-12-21 LAB — CULTURE, BLOOD (ROUTINE X 2)
Culture: NO GROWTH
Culture: NO GROWTH

## 2014-12-24 LAB — IGG, IGA, IGM
IGM, SERUM: 164 mg/dL (ref 41–251)
IgA: 108 mg/dL (ref 68–379)
IgG (Immunoglobin G), Serum: 2390 mg/dL — ABNORMAL HIGH (ref 650–1600)

## 2014-12-24 LAB — PROTEIN ELECTROPHORESIS, SERUM, WITH REFLEX
ALPHA-2-GLOBULIN: 12.1 % — AB (ref 7.1–11.8)
Albumin ELP: 49.4 % — ABNORMAL LOW (ref 55.8–66.1)
Alpha-1-Globulin: 4.5 % (ref 2.9–4.9)
Beta 2: 3.3 % (ref 3.2–6.5)
Beta Globulin: 6.3 % (ref 4.7–7.2)
Gamma Globulin: 24.4 % — ABNORMAL HIGH (ref 11.1–18.8)
M-SPIKE, %: 0.55 g/dL
TOTAL PROTEIN, SERUM ELECTROPHOR: 8.5 g/dL — AB (ref 6.0–8.3)

## 2014-12-24 LAB — IFE INTERPRETATION

## 2015-01-08 ENCOUNTER — Telehealth: Payer: Self-pay | Admitting: Interventional Cardiology

## 2015-01-08 ENCOUNTER — Encounter: Payer: Self-pay | Admitting: Physician Assistant

## 2015-01-08 ENCOUNTER — Ambulatory Visit (INDEPENDENT_AMBULATORY_CARE_PROVIDER_SITE_OTHER): Payer: Medicare Other | Admitting: Physician Assistant

## 2015-01-08 VITALS — BP 150/88 | HR 68 | Ht 70.0 in | Wt 246.0 lb

## 2015-01-08 DIAGNOSIS — R0789 Other chest pain: Secondary | ICD-10-CM

## 2015-01-08 DIAGNOSIS — I25810 Atherosclerosis of coronary artery bypass graft(s) without angina pectoris: Secondary | ICD-10-CM | POA: Diagnosis not present

## 2015-01-08 LAB — TROPONIN I: TNIDX: 0 ug/l (ref 0.00–0.06)

## 2015-01-08 MED ORDER — PANTOPRAZOLE SODIUM 40 MG PO TBEC: 40.0000 mg | DELAYED_RELEASE_TABLET | Freq: Every day | ORAL | Status: DC

## 2015-01-08 NOTE — Patient Instructions (Signed)
Your physician has recommended you make the following change in your medication:   Start Protonix 40 mg by mouth daily.  Your physician recommends that you get labs today. Troponin  Your physician has requested that you have a lexiscan myoview. For further information please visit [email or web address]. Please follow instruction sheet, as given.  Your physician recommends that you schedule a follow-up appointment with Dr. Katrinka BlazingSmith

## 2015-01-08 NOTE — Telephone Encounter (Signed)
New message     Pt c/o of Chest Pain: STAT if CP now or developed within 24 hours  1. Are you having CP right now? Yes----pain is left side of chest to his arm  2. Are you experiencing any other symptoms (ex. SOB, nausea, vomiting, sweating)? no 3. How long have you been experiencing CP? Since yesterday 4. Is your CP continuous or coming and going? continuous  5. Have you taken Nitroglycerin? no?

## 2015-01-08 NOTE — Progress Notes (Signed)
Cardiology Office Note   Date:  01/08/2015   ID:  Naoki, Migliaccio May 23, 1946, MRN [id number]  PCP:  Quentin Mulling, MD  Cardiologist:  Dr . Katrinka Blazing    Chest pain.     History of Present Illness:  [name] is a 69 y.o. male with a history of CAD s/p CABG x3, with early graft failure and re-do surgery (2006), HTN, HLD, and obesity who presents today with chest pain.    Patient's cardiac history dates back to March 2006 when he was admitted to [facility] with ACS. Cardiac cath revealed severe 3V disease, prompting CABG. Three days post-operatively, he developed recurrent ischemic pain with ECG changes. Repeat LHC revealed technical problems with two of his grafts prompting re-operation. He subsequently did well except for occasional chest pain. He underwent nuclear stress test in 2007 which was normal and was admitted/discharged for chest pain rule out in 2011. There was mention a of a normal nuclear stress test in 2011 but i cannot find the report. 2D ECHO 01/2014 with normal LVEF. He was last seen by Dr. Katrinka Blazing in 07/2014 and felt to be stable from a cardiac standpoint. He was recently seen in the ED on 12/14/14 for fever and diagnosed with a viral gastroenteritis and sent home.  He was in his usual state of health until yesterday when he had sudden onset of chest pain at work. 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 The chest pain is in his left breast and described as an ache that radiates down his left arm. It has been constant since yesterday. It is not associated with SOB, diaphoresis or nausea. He has been having problems with constipation lately. He thinks he may have swallowed a bone while eating chicken yesterday and was worried this could have been causing his sx. He does note that he has acid reflux and has been belching lately. He states that this is different from his previous chest pain which was associated with SOB, exertion  and much more intense pain. No exertional CP or SOB. No orthopnea, LE edema or PND.     Past Medical History  Diagnosis Date  . Hypertension   . Coronary artery disease   . High cholesterol   . Anxiety   . Panic attacks   . Anginal pain 01/2005  . History of bronchitis     "used to get it alot" (08/25/2012)  . Arthritis     "knees"  . Chronic lower back pain   . PTSD (post-traumatic stress disorder)     "have been treated in the past" (08/25/2012)  . Depression     Past Surgical History  Procedure Laterality Date  . Coronary artery bypass graft  01/2005    CABG X3  . Cardiac catheterization  01/2005  . Varicose vein surgery  1980's    LLE  . Shrapnel  1969    LLE; left lateral thumb (required grafting)     Current Outpatient Prescriptions  Medication Sig Dispense Refill  . acetaminophen (TYLENOL) 500 MG tablet Take 1 tablet (500 mg total) by mouth every 6 (six) hours as needed for fever. 30 tablet 0  . ALPRAZolam (XANAX) 0.5 MG tablet Take 1 tablet (0.5 mg total) by mouth 3 (three) times daily as needed for anxiety. 90 tablet 1  . aspirin 325 MG EC tablet Take 325 mg by mouth daily.    Marland Kitchen aspirin EC 81  MG tablet Take 81 mg by mouth daily.    Marland Kitchen dicyclomine (BENTYL) 10 MG capsule Take 1 capsule (10 mg total) by mouth 4 (four) times daily -  before meals and at bedtime. 20 capsule 0  . metoprolol succinate (TOPROL-XL) 25 MG 24 hr tablet Take 25 mg by mouth.     No current facility-administered medications for this visit.    Allergies:   Codeine and Vicodin    Social History:  The patient  reports that he has quit smoking. He has never used smokeless tobacco. He reports that he drinks alcohol. He reports that he does not use illicit drugs.   Family History:  The patient's family history includes Cancer in his brother; Diabetes in his mother; Heart disease in his father.    ROS:  Please see the history of present illness.   All other systems are reviewed and negative.     PHYSICAL EXAM: VS:  BP 150/88 mmHg  Pulse 68  Ht  (1.778 m)  Wt 246 lb (111.585 kg)  BMI 35.30 kg/m2  SpO2 99% , BMI Body mass index is 35.3 kg/(m^2). GEN: Well nourished, well developed, in no acute distress HEENT: normal Neck: no JVD, carotid bruits, or masses Cardiac: RRR; no murmurs, rubs, or gallops,no edema  Respiratory:  clear to auscultation bilaterally, normal work of breathing GI: soft, nontender, nondistended, + BS MS: no deformity or atrophy Skin: warm and dry, no rash Neuro:  Strength and sensation are intact Psych: euthymic mood, full affect   EKG:  EKG is ordered today. The ekg ordered today demonstrates NSR with no acute ST or TW changes.    Recent Labs: 02/21/2014: Pro B Natriuretic peptide (BNP) 84.4 12/14/2014: Platelets 107* 12/18/2014: ALT 127*; BUN 15; Creatinine 0.84; Hemoglobin 14.9; Potassium 4.0; Sodium 137    Lipid Panel    Component Value Date/Time   CHOL 251* 08/26/2012 0500   TRIG 282* 08/26/2012 0500   HDL 36* 08/26/2012 0500   CHOLHDL 7.0 08/26/2012 0500   VLDL 56* 08/26/2012 0500   LDLCALC 159* 08/26/2012 0500      Wt Readings from Last 3 Encounters:  01/08/15 246 lb (111.585 kg)  12/18/14 243 lb 3.2 oz (110.315 kg)  08/07/14 257 lb (116.574 kg)      Other studies Reviewed: Additional studies/ records that were reviewed today include: 2D ECHO, nuclear stress test.   Review of the above records demonstrates: - 2D ECHO 01/2014- EF 50-55%, mod LVH, no RWMA, PA pk pressure 44. Trivial pericardial effusion. No tamponade physiology.  - NST (2007)- Stable perfusion study. No evidence of myocardial infarction or ischemia. LVEF 62%  ASSESSMENT AND PLAN:  KENYATTA KEIDEL is a 69 y.o. male with a history of CAD s/p CABG x3, with early graft failure and re-do surgery (2006), HTN, HLD, and obesity who presents today with chest pain.   Chest pain- with both typical and atypical features. Not related to exertion and different from  previous cardiac chest pain. However, partially relieved by NTG in the office and radiating down his left arm. Worrisome for cardiac etiology as he is 10 years out post bypass surgery. ECG stable with no acute ST or TW changes. Dr. Eldridge Dace, the DOD, and I discussed options with patient. We initially felt that he should be admitted for [facility] as he was having ongoing chest pain in the office. Patient very reluctant to do this and opted to draw a troponin today. If troponin positive, we will call him  and direct him to the ED for admission. If negative, will proceed with Steffanie DunnLexiscan myoview and have him follow up with Dr. Katrinka BlazingSmith.   -- Will add on protonix 40mg  po qd to cover for possible GI component.   HTN- mildly elevated today but he rushed out of the house and did not take his Toprol XL 25mg . Continue same dose  HLD- statin intolerant. Consider PSK 9 therapy in this patient has had coronary bypass surgery and is intolerant of other therapies  Current medicines are reviewed at length with the patient today.  The patient does not have concerns regarding medicines.  The following changes have been made:  no change except will add on protonix 40mg  po qd  Labs/ tests ordered today include: Troponin and lexiscan myoview    Disposition:   FU with Dr. Katrinka BlazingSmith depending on results of troponin and NST. If both negative okay to see in 4-6 weeks.   Charlestine MassedSigned, Favian Kittleson R, PA-C  01/08/2015 3:15 PM    [facility] [address], [place], [place]  [address] Phone: [phone]; Fax: [phone]

## 2015-01-08 NOTE — Telephone Encounter (Signed)
Pt called because since yesterday he has been having pain on ride  side of his chest; the pain radiated to his shoulder and left arm, some dizziness. Pt denies SOB. He   took an aspirin yesterday. Pt states does not have NTG medication. Pt has a history of CABG's in 2006. Bary CastillaKaty thompson PA aware and agreed to see pt today. An appointment was made for pt at 2:00 PM. Pt is aware.

## 2015-01-15 ENCOUNTER — Ambulatory Visit (HOSPITAL_COMMUNITY): Payer: Medicare Other | Attending: Cardiology | Admitting: Radiology

## 2015-01-15 DIAGNOSIS — I10 Essential (primary) hypertension: Secondary | ICD-10-CM | POA: Insufficient documentation

## 2015-01-15 DIAGNOSIS — R079 Chest pain, unspecified: Secondary | ICD-10-CM | POA: Diagnosis not present

## 2015-01-15 DIAGNOSIS — Z8249 Family history of ischemic heart disease and other diseases of the circulatory system: Secondary | ICD-10-CM | POA: Diagnosis not present

## 2015-01-15 DIAGNOSIS — I25810 Atherosclerosis of coronary artery bypass graft(s) without angina pectoris: Secondary | ICD-10-CM

## 2015-01-15 DIAGNOSIS — R0789 Other chest pain: Secondary | ICD-10-CM

## 2015-01-15 DIAGNOSIS — E785 Hyperlipidemia, unspecified: Secondary | ICD-10-CM | POA: Insufficient documentation

## 2015-01-15 DIAGNOSIS — Z87891 Personal history of nicotine dependence: Secondary | ICD-10-CM | POA: Diagnosis not present

## 2015-01-15 DIAGNOSIS — R0602 Shortness of breath: Secondary | ICD-10-CM | POA: Diagnosis not present

## 2015-01-15 MED ORDER — TECHNETIUM TC 99M SESTAMIBI GENERIC - CARDIOLITE
30.0000 | Freq: Once | INTRAVENOUS | Status: AC | PRN
  Administered 2015-01-15: 30 via INTRAVENOUS

## 2015-01-15 MED ORDER — TECHNETIUM TC 99M SESTAMIBI GENERIC - CARDIOLITE
10.0000 | Freq: Once | INTRAVENOUS | Status: AC | PRN
  Administered 2015-01-15: 10 via INTRAVENOUS

## 2015-01-15 MED ORDER — REGADENOSON 0.4 MG/5ML IV SOLN
0.4000 mg | Freq: Once | INTRAVENOUS | Status: AC
  Administered 2015-01-15: 0.4 mg via INTRAVENOUS

## 2015-01-15 NOTE — Progress Notes (Signed)
[facility] SITE 3 NUCLEAR MED [address] [place], [place] [address] [phone]    Cardiology Nuclear Med Study  [name] is a 69 y.o. male     MRN : [id number]     DOB: [birth year]  Procedure Date: 01/15/2015  Nuclear Med Background Indication for Stress Test:  Evaluation for Ischemia and Graft Patency History:  CAD-CABG-Redo  2011 MPI: NL Cardiac Risk Factors: Family History - CAD, History of Smoking, Hypertension and Lipids  Symptoms:  Chest Pain, DOE and SOB   Nuclear Pre-Procedure Caffeine/Decaff Intake:  None> 12hrs NPO After: 8:00am   Lungs:  clear O2 Sat: 95% on room air. IV 0.9% NS with Angio Cath:  22g  IV Site: R Wrist x 1, tolerated well IV Started by:  Irean Hong, RN  Chest Size (in):  48 Cup Size: n/a  Height: 5' 10.5" (1.791 m)  Weight:  245 lb (111.131 kg)  BMI:  Body mass index is 34.65 kg/(m^2). Tech Comments:  Patient took Toprol and Xanax this am per patient. Irean Hong, RN.    Nuclear Med Study 1 or 2 day study: 1 day  Stress Test Type:  Eugenie Birks  Reading MD: N/A  Order Authorizing Provider:  Verdis Prime, III, MD, and Cline Crock, [facility]  Resting Radionuclide: Technetium 37m Sestamibi  Resting Radionuclide Dose: 11.0 mCi   Stress Radionuclide:  Technetium 51m Sestamibi  Stress Radionuclide Dose: 33.0 mCi           Stress Protocol Rest HR: 58 Stress HR: 90  Rest BP: 149/85 Stress BP: 160/85  Exercise Time (min): n/a METS: n/a   Predicted Max HR: 153 bpm % Max HR: 58.82 bpm Rate Pressure Product: 21308   Dose of Adenosine (mg):  n/a Dose of Lexiscan: 0.4 mg  Dose of Atropine (mg): n/a Dose of Dobutamine: n/a mcg/kg/min (at max HR)  Stress Test Technologist: Milana Na, EMT-P  Nuclear Technologist:  Kerby Nora, CNMT     Rest Procedure:  Myocardial perfusion imaging was performed at rest 45 minutes following the intravenous administration of Technetium 106m Sestamibi. Rest ECG: Sinus bradycardia rate 57 with  nonspecific ST-T wave changes, poor R-wave progression.  Stress Procedure:  The patient received IV Lexiscan 0.4 mg over 15-seconds.  Technetium 55m Sestamibi injected at 30-seconds. This patient had sob and felt weird with the Lexiscan injection. Quantitative spect images were obtained after a 45 minute delay. Stress ECG: No significant change from baseline ECG  QPS Raw Data Images:  Normal; no motion artifact; normal heart/lung ratio. Stress Images:  There is subtle decreased uptake along the apex/apical lateral region, small in size slightly worse at stress than at rest. A very small degree of ischemia cannot be excluded. Otherwise, homogeneous radiotracer uptake. Rest Images:  As above Subtraction (SDS):  1.  Transient Ischemic Dilatation (Normal <1.22):  0.98 Lung/Heart Ratio (Normal <0.45):  0.28  Quantitative Gated Spect Images QGS EDV:  129 ml QGS ESV:  55 ml  Impression Exercise Capacity:  Lexiscan with no exercise. BP Response:  Normal blood pressure response. Clinical Symptoms:  Typical symptoms with Lexiscan ECG Impression:  No significant ST segment change suggestive of ischemia. Comparison with Prior Nuclear Study: No images to compare  Overall Impression:  Low risk stress nuclear study with very subtle reversible change along the apical lateral distribution. No evidence of significant ischemia identified..  LV Ejection Fraction: 57%.  LV Wall Motion:  NL LV Function; septal wall motion abnormality consistent with prior bypass  surgery  Donato SchultzSKAINS, Rondell Pardon, MD

## 2015-01-20 ENCOUNTER — Telehealth: Payer: Self-pay | Admitting: Interventional Cardiology

## 2015-01-20 NOTE — Telephone Encounter (Signed)
-----   Message from Henry W Smith III, MD sent at 01/17/2015  9:57 PM EST ----- Low risk study. 

## 2015-01-20 NOTE — Telephone Encounter (Signed)
New message ° ° ° ° °Want stress test results °

## 2015-01-21 NOTE — Telephone Encounter (Signed)
Pt aware of lexiscan results.Low risk study.pt verbalized understanding.

## 2015-01-21 NOTE — Telephone Encounter (Signed)
-----   Message from Lesleigh NoeHenry W Smith III, MD sent at 01/17/2015  9:57 PM EST ----- Low risk study.

## 2015-02-10 ENCOUNTER — Encounter: Payer: Self-pay | Admitting: Family Medicine

## 2015-02-18 ENCOUNTER — Other Ambulatory Visit: Payer: Medicare Other

## 2015-02-20 ENCOUNTER — Ambulatory Visit: Payer: Medicare Other | Admitting: Pharmacist

## 2015-02-27 ENCOUNTER — Telehealth: Payer: Self-pay

## 2015-02-27 NOTE — Telephone Encounter (Signed)
dont see any evidence of us calling pt.

## 2015-02-27 NOTE — Telephone Encounter (Signed)
Patient got an automated voicemail as is calling back to see what it was about. The message did not leave details. Please call patient back is necessary! [phone]

## 2015-05-14 ENCOUNTER — Ambulatory Visit (INDEPENDENT_AMBULATORY_CARE_PROVIDER_SITE_OTHER): Payer: Medicare Other | Admitting: Emergency Medicine

## 2015-05-14 VITALS — BP 158/80 | HR 77 | Temp 97.9°F | Resp 18 | Ht 70.5 in | Wt 246.4 lb

## 2015-05-14 DIAGNOSIS — I10 Essential (primary) hypertension: Secondary | ICD-10-CM | POA: Diagnosis not present

## 2015-05-14 DIAGNOSIS — F4323 Adjustment disorder with mixed anxiety and depressed mood: Secondary | ICD-10-CM | POA: Diagnosis not present

## 2015-05-14 DIAGNOSIS — I25810 Atherosclerosis of coronary artery bypass graft(s) without angina pectoris: Secondary | ICD-10-CM | POA: Diagnosis not present

## 2015-05-14 MED ORDER — METOPROLOL SUCCINATE ER 50 MG PO TB24: 50.0000 mg | ORAL_TABLET | Freq: Every day | ORAL | Status: DC

## 2015-05-14 NOTE — Patient Instructions (Signed)

## 2015-05-14 NOTE — Progress Notes (Signed)
Subjective:  Patient ID: [name], male    DOB: 1946-11-28  Age: 69 y.o. MRN: [id number]  CC: Blood Pressure Check and Labs Only   HPI [name] presents  With a concern about his blood pressure. 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 And he said he developed some anxiety and a sensation of shortness of breath. He had no chest pain tightness or heaviness. See symptoms were transitory and resolved with taking a dose of his Xanax. He is now asymptomatic. He is concerned however about his elevated blood pressure.  History Lucy has a past medical history of Hypertension; Coronary artery disease; High cholesterol; Anxiety; Panic attacks; Anginal pain (01/2005); History of bronchitis; Arthritis; Chronic lower back pain; PTSD (post-traumatic stress disorder); and Depression.   He has past surgical history that includes Coronary artery bypass graft (01/2005); Cardiac catheterization (01/2005); Varicose vein surgery (1980's); and Shrapnel (1969).   His  family history includes Cancer in his brother; Diabetes in his mother; Heart disease in his father.  He   reports that he has quit smoking. He has never used smokeless tobacco. He reports that he drinks alcohol. He reports that he does not use illicit drugs.  Outpatient Prescriptions Prior to Visit  Medication Sig Dispense Refill  . ALPRAZolam (XANAX) 0.5 MG tablet Take 1 tablet (0.5 mg total) by mouth 3 (three) times daily as needed for anxiety. 90 tablet 1  . aspirin 325 MG EC tablet Take 325 mg by mouth daily.    . metoprolol succinate (TOPROL-XL) 25 MG 24 hr tablet Take 25 mg by mouth.    Marland Kitchen acetaminophen (TYLENOL) 500 MG tablet Take 1 tablet (500 mg total) by mouth every 6 (six) hours as needed for fever. (Patient not taking: Reported on 05/14/2015) 30 tablet 0  . aspirin EC 81 MG tablet Take 81 mg by mouth daily.    Marland Kitchen dicyclomine (BENTYL) 10 MG capsule Take 1 capsule (10 mg total)  by mouth 4 (four) times daily -  before meals and at bedtime. (Patient not taking: Reported on 05/14/2015) 20 capsule 0  . pantoprazole (PROTONIX) 40 MG tablet Take 1 tablet (40 mg total) by mouth daily. (Patient not taking: Reported on 05/14/2015) 30 tablet 11   No facility-administered medications prior to visit.    History   Social History  . Marital Status: Married    Spouse Name: N/A  . Number of Children: N/A  . Years of Education: N/A   Social History Main Topics  . Smoking status: Former Smoker -- 2.00 packs/day for 5 years  . Smokeless tobacco: Never Used     Comment: 08/25/2012 "quit smoking cigarettes 40 yr ago"  . Alcohol Use: Yes     Comment: social  . Drug Use: No     Comment: 08/25/2012 "last marijuana 10 years ago"  . Sexual Activity: Yes   Other Topics Concern  . None   Social History Narrative     Review of Systems  Constitutional: Negative for fever, chills and appetite change.  HENT: Negative for congestion, ear pain, postnasal drip, sinus pressure and sore throat.   Eyes: Negative for pain and redness.  Respiratory: Positive for chest tightness. Negative for cough, shortness of breath and wheezing.   Cardiovascular: Negative for leg swelling.  Gastrointestinal: Negative for nausea, vomiting, abdominal pain, diarrhea, constipation and blood in stool.  Endocrine: Negative for polyuria.  Genitourinary: Negative for dysuria, urgency,  frequency and flank pain.  Musculoskeletal: Negative for gait problem.  Skin: Negative for rash.  Neurological: Negative for weakness and headaches.  Psychiatric/Behavioral: Negative for confusion and decreased concentration. The patient is nervous/anxious.     Objective:  BP 158/80 mmHg  Pulse 77  Temp(Src) 97.9 F (36.6 C) (Oral)  Resp 18  Ht 5' 10.5" (1.791 m)  Wt 246 lb 6.4 oz (111.766 kg)  BMI 34.84 kg/m2  SpO2 98%  Physical Exam  Constitutional: He is oriented to person, place, and time. He appears  well-developed and well-nourished. No distress.  HENT:  Head: Normocephalic and atraumatic.  Right Ear: External ear normal.  Left Ear: External ear normal.  Nose: Nose normal.  Eyes: Conjunctivae and EOM are normal. Pupils are equal, round, and reactive to light. No scleral icterus.  Neck: Normal range of motion. Neck supple. No tracheal deviation present.  Cardiovascular: Normal rate, regular rhythm and normal heart sounds.   Pulmonary/Chest: Effort normal. No respiratory distress. He has no wheezes. He has no rales.  Abdominal: He exhibits no mass. There is no tenderness. There is no rebound and no guarding.  Musculoskeletal: He exhibits no edema.  Lymphadenopathy:    He has no cervical adenopathy.  Neurological: He is alert and oriented to person, place, and time. Coordination normal.  Skin: Skin is warm and dry. No rash noted.  Psychiatric: He has a normal mood and affect. His behavior is normal.      Assessment & Plan:   Collen was seen today for blood pressure check and labs only.  Diagnoses and all orders for this visit:  Essential hypertension  Adjustment disorder with mixed anxiety and depressed mood  Other orders -     metoprolol succinate (TOPROL-XL) 50 MG 24 hr tablet; Take 1 tablet (50 mg total) by mouth daily.   I have changed Mr. Laughner's metoprolol succinate. I am also having him maintain his aspirin EC, ALPRAZolam, aspirin, acetaminophen, dicyclomine, and pantoprazole.  Meds ordered this encounter  Medications  . metoprolol succinate (TOPROL-XL) 50 MG 24 hr tablet    Sig: Take 1 tablet (50 mg total) by mouth daily.    Dispense:  30 tablet    Refill:  5    Appropriate red flag conditions were discussed with the patient as well as actions that should be taken.  Patient expressed his understanding. With a lengthy discussion about his blood pressure and management as well as anxiety I suggested he could double his dose of beta blocker and will see him back  in about 3 months and recheck his blood pressure at time he was   Agreeable that he was also instructed to follow up for new or worse symptoms. Follow-up: Return in about 3 months (around 08/14/2015).  Carmelina Dane, MD

## 2015-06-12 ENCOUNTER — Other Ambulatory Visit: Payer: Self-pay | Admitting: Emergency Medicine

## 2015-06-14 ENCOUNTER — Telehealth: Payer: Self-pay | Admitting: Internal Medicine

## 2015-06-14 NOTE — Telephone Encounter (Signed)
Mr. Marylyn IshiharaHerbin called that he was out of Metoprolol XL 25 mg once daily. He says he has tried all week to get this medication. I reviewed mediations and he is on 50 mg but he said that dose makes him sick. I called the [facility] on [address] as requested [phone] and gave him #90 metoprolol XL 25 mg. He said otherwise he feels fine but has slightly more elevated blood pressure. He will call the office or come to ER if he has any new symptoms.   Leeann MustJacob Zainab Crumrine, MD

## 2015-06-20 ENCOUNTER — Telehealth: Payer: Self-pay | Admitting: Family Medicine

## 2015-06-21 NOTE — Telephone Encounter (Signed)
Opened in error

## 2015-08-01 ENCOUNTER — Other Ambulatory Visit: Payer: Self-pay | Admitting: Interventional Cardiology

## 2015-08-28 ENCOUNTER — Other Ambulatory Visit: Payer: Self-pay | Admitting: Emergency Medicine

## 2015-09-18 ENCOUNTER — Other Ambulatory Visit: Payer: Self-pay

## 2015-09-18 MED ORDER — METOPROLOL SUCCINATE ER 50 MG PO TB24: 50.0000 mg | ORAL_TABLET | Freq: Every day | ORAL | Status: DC

## 2015-09-24 ENCOUNTER — Emergency Department (HOSPITAL_COMMUNITY): Payer: Medicare Other

## 2015-09-24 ENCOUNTER — Encounter (HOSPITAL_COMMUNITY): Payer: Self-pay | Admitting: *Deleted

## 2015-09-24 ENCOUNTER — Encounter (HOSPITAL_COMMUNITY): Payer: Self-pay | Admitting: Emergency Medicine

## 2015-09-24 ENCOUNTER — Emergency Department (HOSPITAL_COMMUNITY)
Admission: EM | Admit: 2015-09-24 | Discharge: 2015-09-24 | Disposition: A | Discharge status: Home / Self Care | Payer: Medicare Other | Attending: Emergency Medicine | Admitting: Emergency Medicine

## 2015-09-24 ENCOUNTER — Emergency Department (INDEPENDENT_AMBULATORY_CARE_PROVIDER_SITE_OTHER)
Admission: EM | Admit: 2015-09-24 | Discharge: 2015-09-24 | Disposition: A | Discharge status: Nursing Home (Includes ICF) | Payer: Medicare Other | Source: Home / Self Care | Attending: Family Medicine | Admitting: Family Medicine

## 2015-09-24 DIAGNOSIS — E782 Mixed hyperlipidemia: Secondary | ICD-10-CM | POA: Diagnosis not present

## 2015-09-24 DIAGNOSIS — I10 Essential (primary) hypertension: Secondary | ICD-10-CM | POA: Diagnosis not present

## 2015-09-24 DIAGNOSIS — F419 Anxiety disorder, unspecified: Secondary | ICD-10-CM | POA: Diagnosis not present

## 2015-09-24 DIAGNOSIS — M1990 Unspecified osteoarthritis, unspecified site: Secondary | ICD-10-CM | POA: Diagnosis not present

## 2015-09-24 DIAGNOSIS — Z87891 Personal history of nicotine dependence: Secondary | ICD-10-CM | POA: Diagnosis not present

## 2015-09-24 DIAGNOSIS — G8929 Other chronic pain: Secondary | ICD-10-CM | POA: Insufficient documentation

## 2015-09-24 DIAGNOSIS — I25119 Atherosclerotic heart disease of native coronary artery with unspecified angina pectoris: Secondary | ICD-10-CM | POA: Insufficient documentation

## 2015-09-24 DIAGNOSIS — R079 Chest pain, unspecified: Secondary | ICD-10-CM | POA: Diagnosis not present

## 2015-09-24 DIAGNOSIS — Z7982 Long term (current) use of aspirin: Secondary | ICD-10-CM | POA: Insufficient documentation

## 2015-09-24 DIAGNOSIS — R011 Cardiac murmur, unspecified: Secondary | ICD-10-CM | POA: Insufficient documentation

## 2015-09-24 DIAGNOSIS — R040 Epistaxis: Secondary | ICD-10-CM | POA: Diagnosis not present

## 2015-09-24 DIAGNOSIS — F329 Major depressive disorder, single episode, unspecified: Secondary | ICD-10-CM | POA: Diagnosis not present

## 2015-09-24 LAB — CBC
HCT: 44.9 % (ref 39.0–52.0)
HEMOGLOBIN: 14.7 g/dL (ref 13.0–17.0)
MCH: 30.5 pg (ref 26.0–34.0)
MCHC: 32.7 g/dL (ref 30.0–36.0)
MCV: 93.2 fL (ref 78.0–100.0)
Platelets: 121 10*3/uL — ABNORMAL LOW (ref 150–400)
RBC: 4.82 MIL/uL (ref 4.22–5.81)
RDW: 12.7 % (ref 11.5–15.5)
WBC: 5.4 10*3/uL (ref 4.0–10.5)

## 2015-09-24 LAB — URINALYSIS, ROUTINE W REFLEX MICROSCOPIC
Bilirubin Urine: NEGATIVE
Glucose, UA: NEGATIVE mg/dL
Hgb urine dipstick: NEGATIVE
KETONES UR: NEGATIVE mg/dL
Nitrite: NEGATIVE
PH: 7.5 (ref 5.0–8.0)
Protein, ur: NEGATIVE mg/dL
SPECIFIC GRAVITY, URINE: 1.02 (ref 1.005–1.030)
UROBILINOGEN UA: 2 mg/dL — AB (ref 0.0–1.0)

## 2015-09-24 LAB — COMPREHENSIVE METABOLIC PANEL
ALT: 213 U/L — ABNORMAL HIGH (ref 17–63)
ANION GAP: 12 (ref 5–15)
AST: 180 U/L — AB (ref 15–41)
Albumin: 3.7 g/dL (ref 3.5–5.0)
Alkaline Phosphatase: 54 U/L (ref 38–126)
BUN: 8 mg/dL (ref 6–20)
CHLORIDE: 100 mmol/L — AB (ref 101–111)
CO2: 25 mmol/L (ref 22–32)
Calcium: 9.6 mg/dL (ref 8.9–10.3)
Creatinine, Ser: 0.85 mg/dL (ref 0.61–1.24)
GFR calc Af Amer: >60 mL/min (ref >60)
GFR calc non Af Amer: >60 mL/min (ref >60)
Glucose, Bld: 117 mg/dL — ABNORMAL HIGH (ref 65–99)
Potassium: 3.8 mmol/L (ref 3.5–5.1)
Sodium: 137 mmol/L (ref 135–145)
Total Bilirubin: 1 mg/dL (ref 0.3–1.2)
Total Protein: 8.6 g/dL — ABNORMAL HIGH (ref 6.5–8.1)

## 2015-09-24 LAB — URINE MICROSCOPIC-ADD ON

## 2015-09-24 LAB — I-STAT TROPONIN, ED: Troponin i, poc: 0 ng/mL (ref 0.00–0.08)

## 2015-09-24 MED ORDER — ASPIRIN 81 MG PO CHEW
324.0000 mg | CHEWABLE_TABLET | Freq: Once | ORAL | Status: AC
  Administered 2015-09-24: 324 mg via ORAL
  Filled 2015-09-24: qty 4

## 2015-09-24 MED ORDER — ALPRAZOLAM 0.5 MG PO TABS: 0.5000 mg | ORAL_TABLET | Freq: Every evening | ORAL | Status: DC | PRN

## 2015-09-24 MED ORDER — LORAZEPAM 1 MG PO TABS: 1.0000 mg | ORAL_TABLET | Freq: Once | ORAL | Status: DC

## 2015-09-24 MED ORDER — ALPRAZOLAM 0.25 MG PO TABS
0.5000 mg | ORAL_TABLET | Freq: Once | ORAL | Status: AC
  Administered 2015-09-24: 0.5 mg via ORAL
  Filled 2015-09-24: qty 2

## 2015-09-24 NOTE — Discharge Instructions (Signed)
Please contact her cardiologist first thing tomorrow morning and schedule follow-up evaluation. Please contact her primary care provider do the same. Worsening signs or symptoms present please return to the emergency room for further evaluation and management.

## 2015-09-24 NOTE — ED Notes (Signed)
Pt here for htn; pt sts had nosebleed today; pt sts takes metoprol

## 2015-09-24 NOTE — ED Notes (Signed)
Pt transported to radiology.

## 2015-09-24 NOTE — ED Provider Notes (Signed)
CSN: [id number]     Arrival date & time 09/24/15  1441 History   First MD Initiated Contact with Patient 09/24/15 1622     Chief Complaint  Patient presents with  . Hypertension   HPI   69 year old male presents today with complaints of nosebleed and hypertension. Patient was seen today at urgent care for nosebleed, and at his request was sent to the emergency room for anxiety related to epistaxis and hypertension. At the time of my evaluation patient reports that the nosebleed is stopped, reports that he feels anxious which is normal for him. He states that he used to take Xanax for this but stopped taking it on his own reporting that he did not want her taking long-term. Patient reports that he has some pain in his chest, located in the central portion, with some pain in his left arm. Multiple attempts at having patient describes the pain were unsuccessful, patient eventually reported that he was pressure type pain, very minimal, similar to previous angina type symptoms for which she takes nitroglycerin. Patient reports the symptoms started while he was in the waiting room approximately 2 hours ago. She reports that the arm pain is mostly in his left bicep, he reports that it was not present at the time of onset of chest pain but feels it may be due to the blood pressure cuff that was on his arm. Patient reports today's chest pain is unlike previous chest pain that resulted in CABG in 2006. She reports a dry nonproductive cough as fever, chills, nausea, vomiting, diaphoresis, abdominal pain, acid reflux, or any other concerning signs or symptoms.  Chart review shows patient has had similar presentations numerous time seen in the emergency room for the same. In June he was instructed to increase his metoprolol dose to 50 mg from 25, patient reports he did this but felt sick so he discontinued using it at the 50 mg dose.     Past Medical History  Diagnosis Date  . Hypertension   . Coronary artery  disease   . High cholesterol   . Anxiety   . Panic attacks   . Anginal pain (HCC) 01/2005  . History of bronchitis     "used to get it alot" (08/25/2012)  . Arthritis     "knees"  . Chronic lower back pain   . PTSD (post-traumatic stress disorder)     "have been treated in the past" (08/25/2012)  . Depression    Past Surgical History  Procedure Laterality Date  . Coronary artery bypass graft  01/2005    CABG X3  . Cardiac catheterization  01/2005  . Varicose vein surgery  1980's    LLE  . Shrapnel  1969    LLE; left lateral thumb (required grafting)   Family History  Problem Relation Age of Onset  . Diabetes Mother   . Heart disease Father   . Cancer Brother    Social History  Substance Use Topics  . Smoking status: Former Smoker -- 2.00 packs/day for 5 years  . Smokeless tobacco: Never Used     Comment: 08/25/2012 "quit smoking cigarettes 40 yr ago"  . Alcohol Use: Yes     Comment: social    Review of Systems  All other systems reviewed and are negative.   Allergies  Codeine and Vicodin  Home Medications   Prior to Admission medications   Medication Sig Start Date End Date Taking? Authorizing Provider  aspirin 325 MG EC tablet Take 325  mg by mouth daily.   Yes Historical Provider, MD  metoprolol succinate (TOPROL-XL) 50 MG 24 hr tablet Take 1 tablet (50 mg total) by mouth daily. 09/18/15  Yes Lyn Records, MD  NITROSTAT 0.4 MG SL tablet DISSOLVE ONE TABLET UNDER THE TONGUE SUBLINGUAL AS NEEDED FOR 30 DAYS 08/01/15  Yes Lyn Records, MD  acetaminophen (TYLENOL) 500 MG tablet Take 1 tablet (500 mg total) by mouth every 6 (six) hours as needed for fever. Patient not taking: Reported on 05/14/2015 12/14/14   Antony Madura, PA-C  ALPRAZolam Prudy Feeler) 0.5 MG tablet Take 1 tablet (0.5 mg total) by mouth at bedtime as needed for anxiety. 09/24/15   Eyvonne Mechanic, PA-C  dicyclomine (BENTYL) 10 MG capsule Take 1 capsule (10 mg total) by mouth 4 (four) times daily -  before meals  and at bedtime. Patient not taking: Reported on 05/14/2015 12/18/14   Peyton Najjar, MD  pantoprazole (PROTONIX) 40 MG tablet Take 1 tablet (40 mg total) by mouth daily. Patient not taking: Reported on 05/14/2015 01/08/15   Janetta Hora, PA-C   BP 173/81 mmHg  Pulse 64  Temp(Src) 97.5 F (36.4 C) (Oral)  Resp 17  SpO2 98%   Physical Exam  Constitutional: He is oriented to person, place, and time. He appears well-developed and well-nourished.  HENT:  Head: Normocephalic and atraumatic.  Eyes: Conjunctivae are normal. Pupils are equal, round, and reactive to light. Right eye exhibits no discharge. Left eye exhibits no discharge. No scleral icterus.  Neck: Normal range of motion. No JVD present. No tracheal deviation present.  Cardiovascular: Normal rate and regular rhythm.  Exam reveals no gallop and no friction rub.   Murmur heard. Pulmonary/Chest: Effort normal and breath sounds normal. No stridor. No respiratory distress. He has no wheezes. He has no rales. He exhibits no tenderness.  Linear surgical scar  Abdominal: Soft. He exhibits no distension and no mass. There is no tenderness. There is no rebound and no guarding.  Musculoskeletal: Normal range of motion. He exhibits no edema or tenderness.  Neurological: He is alert and oriented to person, place, and time. Coordination normal.  Skin: Skin is warm and dry. No rash noted. No erythema. No pallor.  Psychiatric: He has a normal mood and affect. His behavior is normal. Judgment and thought content normal.  Nursing note and vitals reviewed.   ED Course  Procedures (including critical care time) Labs Review Labs Reviewed  COMPREHENSIVE METABOLIC PANEL - Abnormal; Notable for the following:    Chloride 100 (*)    Glucose, Bld 117 (*)    Total Protein 8.6 (*)    AST 180 (*)    ALT 213 (*)    All other components within normal limits  CBC - Abnormal; Notable for the following:    Platelets 121 (*)    All other components  within normal limits  URINALYSIS, ROUTINE W REFLEX MICROSCOPIC (NOT AT [facility]) - Abnormal; Notable for the following:    Urobilinogen, UA 2.0 (*)    Leukocytes, UA TRACE (*)    All other components within normal limits  URINE MICROSCOPIC-ADD ON  Rosezena Sensor, ED    Imaging Review Dg Chest 2 View  09/24/2015  CLINICAL DATA:  Hypertension with epistaxis EXAM: CHEST  2 VIEW COMPARISON:  December 14, 2014 FINDINGS: There is no edema or consolidation. There is slight scarring in the lingula, stable. Heart size and pulmonary vascularity are normal. No adenopathy. Patient is status post coronary artery bypass  grafting. There is degenerative change in each shoulder. IMPRESSION: Slight scarring in the lingula. No edema or consolidation. No change in cardiac silhouette. Electronically Signed   By: Bretta Bang III M.D.   On: 09/24/2015 18:03   I have personally reviewed and evaluated these images and lab results as part of my medical decision-making.   EKG Interpretation None       MDM   Final diagnoses:  Essential hypertension  Anxiety  Chest pain, unspecified chest pain type    Labs: CMP, CBC, troponin, urinalysis- significant findings  Imaging: DG chest- no acute findings , ED EKG  Consults:  Therapeutics: Ativan  Discharge Meds:   Assessment/Plan: Patient presents with hypertension, anxiety, chest pain. Patient has been seen for this previously, he has a negative troponin here, pain completely resolved with Ativan, blood pressure significantly improved with Ativan. I believe patient's hypertension and chest related symptoms are related to his anxiety and not ACS. Patient will be given a short course of Ativan until he can make contact with his primary care provider. He is instructed call his cardiologist first thing tomorrow morning said a follow-up evaluation for further management. Patient is in no acute distress, asymptomatic at the time of discharge, he verbalized  understanding and agreement to today's plan and had no further questions or concerns at time of discharge.         Eyvonne Mechanic, PA-C 09/24/15 1939  Cathren Laine, MD 09/24/15 2113

## 2015-09-24 NOTE — ED Notes (Signed)
Pt  Reports     Nosebleed        History      Of      htn     -  Pt     Reports        He  Took  His meds  This  Am       Pt  States  While  At  Work  Today  He  Felt  Blood  Going  Down  Back  Of throat              He  Used    Cotton  To  Stop  The  Bleeding  Pt  Is  Anxious  At this  Time

## 2015-09-24 NOTE — ED Provider Notes (Signed)
CSN: [id number]     Arrival date & time 09/24/15  1307 History   First MD Initiated Contact with Patient 09/24/15 1354     Chief Complaint  Patient presents with  . Epistaxis   (Consider location/radiation/quality/duration/timing/severity/associated sxs/prior Treatment) Patient is a 69 y.o. male presenting with nosebleeds. The history is provided by the patient.  Epistaxis Location:  R nare Severity:  Moderate Duration:  11 hours Progression:  Resolved Chronicity:  New Context: aspirin use   Relieved by:  Nasal tampon Associated symptoms: blood in oropharynx   Associated symptoms: no congestion     Past Medical History  Diagnosis Date  . Hypertension   . Coronary artery disease   . High cholesterol   . Anxiety   . Panic attacks   . Anginal pain (HCC) 01/2005  . History of bronchitis     "used to get it alot" (08/25/2012)  . Arthritis     "knees"  . Chronic lower back pain   . PTSD (post-traumatic stress disorder)     "have been treated in the past" (08/25/2012)  . Depression    Past Surgical History  Procedure Laterality Date  . Coronary artery bypass graft  01/2005    CABG X3  . Cardiac catheterization  01/2005  . Varicose vein surgery  1980's    LLE  . Shrapnel  1969    LLE; left lateral thumb (required grafting)   Family History  Problem Relation Age of Onset  . Diabetes Mother   . Heart disease Father   . Cancer Brother    Social History  Substance Use Topics  . Smoking status: Former Smoker -- 2.00 packs/day for 5 years  . Smokeless tobacco: Never Used     Comment: 08/25/2012 "quit smoking cigarettes 40 yr ago"  . Alcohol Use: Yes     Comment: social    Review of Systems  Constitutional: Negative.   HENT: Positive for nosebleeds. Negative for congestion and postnasal drip.   Respiratory: Negative.   Cardiovascular: Negative.   All other systems reviewed and are negative.   Allergies  Codeine and Vicodin  Home Medications   Prior to Admission  medications   Medication Sig Start Date End Date Taking? Authorizing Provider  acetaminophen (TYLENOL) 500 MG tablet Take 1 tablet (500 mg total) by mouth every 6 (six) hours as needed for fever. Patient not taking: Reported on 05/14/2015 12/14/14   Antony MaduraKelly Humes, PA-C  ALPRAZolam Prudy Feeler(XANAX) 0.5 MG tablet Take 1 tablet (0.5 mg total) by mouth 3 (three) times daily as needed for anxiety. 11/12/14   Carmelina DaneJeffery S Anderson, MD  aspirin 325 MG EC tablet Take 325 mg by mouth daily.    Historical Provider, MD  aspirin EC 81 MG tablet Take 81 mg by mouth daily.    Historical Provider, MD  dicyclomine (BENTYL) 10 MG capsule Take 1 capsule (10 mg total) by mouth 4 (four) times daily -  before meals and at bedtime. Patient not taking: Reported on 05/14/2015 12/18/14   Peyton Najjaravid H Hopper, MD  metoprolol succinate (TOPROL-XL) 50 MG 24 hr tablet Take 1 tablet (50 mg total) by mouth daily. 09/18/15   Lyn RecordsHenry W Smith, MD  NITROSTAT 0.4 MG SL tablet DISSOLVE ONE TABLET UNDER THE TONGUE SUBLINGUAL AS NEEDED FOR 30 DAYS 08/01/15   Lyn RecordsHenry W Smith, MD  pantoprazole (PROTONIX) 40 MG tablet Take 1 tablet (40 mg total) by mouth daily. Patient not taking: Reported on 05/14/2015 01/08/15   Janetta HoraKathryn R Thompson, PA-C  Meds Ordered and Administered this Visit  Medications - No data to display  BP 221/97 mmHg  Pulse 91  Temp(Src) 98.1 F (36.7 C) (Oral)  Resp 16  SpO2 95% No data found.   Physical Exam  Constitutional: He is oriented to person, place, and time. He appears well-developed and well-nourished. He appears distressed.  HENT:  Right Ear: External ear normal.  Left Ear: External ear normal.  Nose: No sinus tenderness or nasal septal hematoma. Epistaxis is observed.    Mouth/Throat: Oropharynx is clear and moist.  Eyes: Conjunctivae are normal. Pupils are equal, round, and reactive to light.  Neck: Normal range of motion. Neck supple.  Cardiovascular: Normal heart sounds and intact distal pulses.   Pulmonary/Chest: Effort  normal and breath sounds normal.  Lymphadenopathy:    He has no cervical adenopathy.  Neurological: He is alert and oriented to person, place, and time.  Skin: Skin is warm and dry.  Nursing note and vitals reviewed.   ED Course  Procedures (including critical care time)  Labs Review Labs Reviewed - No data to display  Imaging Review No results found.   Visual Acuity Review  Right Eye Distance:   Left Eye Distance:   Bilateral Distance:    Right Eye Near:   Left Eye Near:    Bilateral Near:         MDM   1. Essential hypertension   2. Right-sided epistaxis    Sent to ER at pt request from acute anxiety re to epistaxis and hypertension with h/o cabg and concern about possible cva.    Linna Hoff, MD 09/24/15 [phone]

## 2015-09-25 ENCOUNTER — Telehealth: Payer: Self-pay | Admitting: Interventional Cardiology

## 2015-09-25 NOTE — Telephone Encounter (Signed)
New Message  Pt c/o of Chest Pain: Notes from ED in Epic. Per Discharge schedule   Comments: Pt states that he was recently in the hospital ED. He states that he was at risk for having a stroke. Request an appt within the next 24 Hours. Made an appt with PA Nada BoozerLaura Ingold for 11/01 @ 8:30a. Pt is adamant about receiving an appt that is sooner because he believes that he is at risk for a heart attack. Please assist.

## 2015-09-25 NOTE — Telephone Encounter (Signed)
Patient recently seen in ED. Patient stated " I was told to see my cardiologist ASAP" Patient complianing of HTN at this time, BP 165/81. Patient stated he was have CP and SOB yesterday before going to ED, but after they gave him ASA it resolved. Patient sounds anxious. Will consult FLEX.   Left message for patient to call back.

## 2015-09-25 NOTE — Telephone Encounter (Signed)
Patient called back. Informed patient that FLEX, Ronie Spiesayna Dunn PA, wanted him to come in today or tomorrow. Patient can come in tomorrow, scheduled patient on FLEX at 1:30. Encouraged patient to show up and be on time for appointment. Patient verbalized understanding.

## 2015-09-26 ENCOUNTER — Ambulatory Visit: Payer: Medicare Other | Admitting: Nurse Practitioner

## 2015-09-26 ENCOUNTER — Ambulatory Visit (INDEPENDENT_AMBULATORY_CARE_PROVIDER_SITE_OTHER): Payer: Medicare Other | Admitting: Family Medicine

## 2015-09-26 VITALS — BP 137/60 | HR 74 | Temp 98.3°F | Resp 20 | Ht 71.0 in | Wt 242.8 lb

## 2015-09-26 DIAGNOSIS — Z5181 Encounter for therapeutic drug level monitoring: Secondary | ICD-10-CM

## 2015-09-26 DIAGNOSIS — F411 Generalized anxiety disorder: Secondary | ICD-10-CM | POA: Diagnosis not present

## 2015-09-26 DIAGNOSIS — F4310 Post-traumatic stress disorder, unspecified: Secondary | ICD-10-CM | POA: Diagnosis not present

## 2015-09-26 DIAGNOSIS — R7309 Other abnormal glucose: Secondary | ICD-10-CM

## 2015-09-26 DIAGNOSIS — I25810 Atherosclerosis of coronary artery bypass graft(s) without angina pectoris: Secondary | ICD-10-CM

## 2015-09-26 DIAGNOSIS — F1010 Alcohol abuse, uncomplicated: Secondary | ICD-10-CM | POA: Diagnosis not present

## 2015-09-26 DIAGNOSIS — K7010 Alcoholic hepatitis without ascites: Secondary | ICD-10-CM

## 2015-09-26 DIAGNOSIS — D696 Thrombocytopenia, unspecified: Secondary | ICD-10-CM | POA: Diagnosis not present

## 2015-09-26 MED ORDER — ALPRAZOLAM 0.5 MG PO TABS: 0.5000 mg | ORAL_TABLET | Freq: Two times a day (BID) | ORAL | Status: DC | PRN

## 2015-09-26 MED ORDER — PAROXETINE HCL 20 MG PO TABS: 20.0000 mg | ORAL_TABLET | Freq: Every day | ORAL | Status: DC

## 2015-09-26 NOTE — Patient Instructions (Addendum)
GO TO AA   The [employer] on Alcoholism and Drug Dependence (NCADD). This group has information about treatment centers and programs for people who have an addiction and for family members.  The telephone number is 1-800-NCA-CALL ((802)156-3645).  The website is https://ncadd.org/about-ncadd/our-affiliates  The Substance Abuse and Mental Health Services Administration [facility]). This group will help you find publicly funded treatment centers, help hotlines, and counseling services near you.  The telephone number is 1-800-662-HELP ((682)500-0424).  The website is www.findtreatment.RockToxic.pl  You can call the 24-hour [facility] HelpLine at (534)754-5510 or [phone] for immediate assistance. Among several different types of services, they offer an Intensive oupatient program for mood disorders - which is a group type setting Monday-Friday 9-noon.  You can schedule an assessment by calling the above numbers during which the costs for the program and insurance benefits will be reviewed.    No psychological or psychiatric services take physician referrals - they always want the patient to call. Some excellent private psychiatrists for individual counseling are:  [facility] [address] [place], [place] [address] Phone: [phone]  [facility]  [address], [place], [place] [address]  [phone]  [facility] [address] [place], [place], [place] [address]  Phone: [phone]   [facility] [address] [place], [place] [address]  [phone]  [facility] [address] [place], [place] Phone: [phone]   [facility]  [address] [place], [place] [address]  [phone]   Charlies Silvers, MD, PA [address] [place], [place]  [address] Phone: [phone]  You can always go to [facility] if suicidal or there are walk-in crisis services available at: [facility] [address], [place] [address] [phone]  Hour of operations: Monday-Friday, 8:30-5 p.m.  They take medicare/medicaid and the patient can contact [facility] referral department at [phone] or [email or web address] for more information.     Alcoholic Liver Disease The liver is an organ that converts food into energy, absorbs vitamins from food, removes toxins from the blood, and makes proteins. Alcoholic liver disease happens when the liver becomes damaged due to alcohol consumption and it stops working properly. CAUSES This condition is caused by drinking too much alcohol over a number of years. RISK FACTORS This condition is more likely to develop in:  Women.  People who have a family history of the disease.  People who have poor nutrition.  People who are obese. SYMPTOMS Early symptoms of this condition include:  Fatigue.  Weakness.  Abdominal discomfort on the upper right side. Symptoms of moderate disease include:  Fever.  Yellow, pale, or darkening skin.  Abdominal pain.  Nausea and vomiting.  Weight loss.  Loss of appetite. Symptoms of advanced disease include:  Abdominal swelling.  Nosebleeds or bleeding gums.  Itchy skin.  Enlarged fingertips (clubbing).  Light-colored stools that smell very bad (steatorrhea).  Mood changes.  Feeling agitated.  Confusion.  Trouble concentrating. Some people do not have symptoms until the condition becomes severe. Symptoms are often worse after heavy drinking. DIAGNOSIS This condition is diagnosed with:  A physical exam.  Blood tests.  Taking a sample of liver tissue to examine under a microscope (liver biopsy). You may also have other tests, including:   X-rays.  Ultrasound. TREATMENT Treatment may include:  Stopping  alcohol use to allow the liver to heal.  Joining a support group or meeting with a Veterinary surgeon.  Medicines to reduce inflammation. These may be recommended if the disease is severe.  A liver transplant. This is the only treatment if the disease is very severe.  Nutritional therapy. This may involve:  Taking vitamins.  Eating foods that are high in thiamine, such as whole-wheat cereals, pork, and raw vegetables.  Eating foods that have a lot of folic acid, such as vegetables, fruits, meats, beans, nuts, and dairy foods.  Eating a diet that includes carbohydrate-rich foods, such as yogurt, beans, potatoes, and rice. HOME CARE INSTRUCTIONS  Do not drink alcohol.  Take medicines only as directed by your health care provider.  Take vitamins only as directed by your health care provider.  Follow any diet instructions that are given to you by your health care provider. SEEK MEDICAL CARE IF:  You have a fever.  You have shortness of breath or have difficulty breathing.  You have bright red blood in your stool, or you have black, tarry stools.  You are vomiting blood.  Your skin color becomes more yellow, pale, or dark.  You develop headaches.  You have trouble thinking.  You have problems balancing or walking.   This information is not intended to replace advice given to you by your health care provider. Make sure you discuss any questions you have with your health care provider.   Document Released: 12/06/2014 Document Reviewed: 12/06/2014 Elsevier Interactive Patient Education 2016 [employer].  Alcohol Use Disorder Alcohol use disorder is a mental disorder. It is not a one-time incident of heavy drinking. Alcohol use disorder is the excessive and uncontrollable use of alcohol over time that leads to problems with functioning in one or more areas of daily living. People with this disorder risk harming themselves and others when they drink to excess. Alcohol use disorder  also can cause other mental disorders, such as mood and anxiety disorders, and serious physical problems. People with alcohol use disorder often misuse other drugs.  Alcohol use disorder is common and widespread. Some people with this disorder drink alcohol to cope with or escape from negative life events. Others drink to relieve chronic pain or symptoms of mental illness. People with a family history of alcohol use disorder are at higher risk of losing control and using alcohol to excess.  Drinking too much alcohol can cause injury, accidents, and health problems. One drink can be too much when you are:  Working.  Pregnant or breastfeeding.  Taking medicines. Ask your doctor.  Driving or planning to drive. SYMPTOMS  Signs and symptoms of alcohol use disorder may include the following:   Consumption ofalcohol inlarger amounts or over a longer period of time than intended.  Multiple unsuccessful attempts to cutdown or control alcohol use.   A great deal of time spent obtaining alcohol, using alcohol, or recovering from the effects of alcohol (hangover).  A strong desire or urge to use alcohol (cravings).   Continued use of alcohol despite problems at work, school, or home because of alcohol use.   Continued use of alcohol despite problems in relationships because of alcohol use.  Continued use of alcohol in situations when it is physically hazardous, such as driving a car.  Continued use of alcohol despite awareness of a physical or psychological problem that is likely related to alcohol use. Physical problems related to alcohol use can involve the brain, heart, liver, stomach, and intestines. Psychological problems related to alcohol use include intoxication, depression, anxiety,  psychosis, delirium, and dementia.   The need for increased amounts of alcohol to achieve the same desired effect, or a decreased effect from the consumption of the same amount of alcohol  (tolerance).  Withdrawal symptoms upon reducing or stopping alcohol use, or alcohol use to reduce or avoid withdrawal symptoms. Withdrawal symptoms include:  Racing heart.  Hand tremor.  Difficulty sleeping.  Nausea.  Vomiting.  Hallucinations.  Restlessness.  Seizures. DIAGNOSIS Alcohol use disorder is diagnosed through an assessment by your health care provider. Your health care provider may start by asking three or four questions to screen for excessive or problematic alcohol use. To confirm a diagnosis of alcohol use disorder, at least two symptoms must be present within a 76-month period. The severity of alcohol use disorder depends on the number of symptoms:  Mild--two or three.  Moderate--four or five.  Severe--six or more. Your health care provider may perform a physical exam or use results from lab tests to see if you have physical problems resulting from alcohol use. Your health care provider may refer you to a mental health professional for evaluation. TREATMENT  Some people with alcohol use disorder are able to reduce their alcohol use to low-risk levels. Some people with alcohol use disorder need to quit drinking alcohol. When necessary, mental health professionals with specialized training in substance use treatment can help. Your health care provider can help you decide how severe your alcohol use disorder is and what type of treatment you need. The following forms of treatment are available:   Detoxification. Detoxification involves the use of prescription medicines to prevent alcohol withdrawal symptoms in the first week after quitting. This is important for people with a history of symptoms of withdrawal and for heavy drinkers who are likely to have withdrawal symptoms. Alcohol withdrawal can be dangerous and, in severe cases, cause death. Detoxification is usually provided in a hospital or in-patient substance use treatment facility.  Counseling or talk therapy.  Talk therapy is provided by substance use treatment counselors. It addresses the reasons people use alcohol and ways to keep them from drinking again. The goals of talk therapy are to help people with alcohol use disorder find healthy activities and ways to cope with life stress, to identify and avoid triggers for alcohol use, and to handle cravings, which can cause relapse.  Medicines.Different medicines can help treat alcohol use disorder through the following actions:  Decrease alcohol cravings.  Decrease the positive reward response felt from alcohol use.  Produce an uncomfortable physical reaction when alcohol is used (aversion therapy).  Support groups. Support groups are run by people who have quit drinking. They provide emotional support, advice, and guidance. These forms of treatment are often combined. Some people with alcohol use disorder benefit from intensive combination treatment provided by specialized substance use treatment centers. Both inpatient and outpatient treatment programs are available.   This information is not intended to replace advice given to you by your health care provider. Make sure you discuss any questions you have with your health care provider.   Document Released: 12/23/2004 Document Revised: 12/06/2014 Document Reviewed: 02/22/2013 Elsevier Interactive Patient Education 2016 [employer]. Finding Treatment for Addiction WHAT IS ADDICTION? Addiction is a complex disease of the brain. It causes an uncontrollable (compulsive) need for a substance. You can be addicted to alcohol, illegal drugs, or prescription medicines such as painkillers. Addiction can also be a behavior, like gambling or shopping. The need for the drug or activity can become so strong  that you think about it all the time. You can also become physically dependent on a substance. Addiction can change the way your brain works. Because of these changes, getting more of whatever you are  addicted to becomes the most important thing to you and feels better than other activities or relationships. Addiction can lead to changes in health, behavior, emotions, relationships, and choices that affect you and everyone around you. HOW DO I KNOW IF I NEED TREATMENT FOR ADDICTION? Addiction is a progressive disease. Without treatment, addiction can get worse. Living with addiction puts you at higher risk for injury, poor health, lost employment, loss of money, and even death. You might need treatment for addiction if:  You have tried to stop or cut down, but you cannot.  Your addiction is causing physical health problems.  You find it annoying that your friends and family are concerned about your alcohol or substance use.  You feel guilty about substance abuse or a compulsive behavior.  You have lied or tried to hide your addiction.  You need a particular substance or activity to start your day or to calm down.  You are getting in trouble at school, work, home, or with the police.  You have done something illegal to support your addiction.  You are running out of money because of your addiction.  You have no time for anything other than your addiction. WHAT TYPES OF TREATMENT ARE AVAILABLE? The treatment program that is right for you will depend on many factors, including the type of addiction you have. Treatment programs can be outpatient or inpatient. In an outpatient program, you live at home and go to work or school, but you also go to a clinic for treatment. With an inpatient program, you live and sleep at the program facility during treatment. After treatment, you might need a plan for support during recovery. Other treatment options include:   Medicine.  Some addictions may be treated with prescription medicines.  You might also need medicine to treat anxiety or depression.  Counseling and behavior therapy. Therapy can help individuals and families behave in healthier  ways and relate more effectively.  Support groups. Confidential group therapy, such as a 12-step program, can help individuals and families during treatment and recovery. No single type of program is right for everyone. Many treatment programs involve a combination of education, counseling, and a 12-step, spiritually-based approach. Some treatment programs are government sponsored. They are geared for patients who do not have private insurance. Treatment programs can vary in many respects, such as:  Cost and types of insurance that are accepted.  Types of on-site medical services that are offered.  Length of stay, setting, and size.  Overall philosophy of treatment. WHAT SHOULD I CONSIDER WHEN SELECTING A TREATMENT PROGRAM? It is important to think about your individual requirements when selecting a treatment program. There are a number of things to consider, such as:  If the program is certified by the appropriate government agency. Even private programs must be certified and employ certified professionals.  If the program is covered by your insurance. If finances are a concern, the first call you should make is to your insurance company, if you have health insurance. Ask for a list of treatment programs that are in your network, and confirm any copayments and deductibles that you may have to pay.  If you do not have insurance, or if you choose to attend a program that does not accept your insurance, discuss whether a  payment plan can be set up.  If treatment is available in languages other than English, if needed.  If the program offers detoxification treatment, if needed.  If 12-step meetings are held at the center or if transport is available for patients to attend meetings at other locations.  If the program is professional, organized, and clean.  If the program meets all of your needs, including physical and cultural needs.  If the facility offers specific treatment for your  particular addiction.  If support continues to be offered after you have left the program.  If your treatment plan is continually looked at to make sure you are receiving the right treatment at the right time.  If mental health counseling is part of your treatment.  If medicine is included in treatment, if needed.  If your family is included in your treatment plan and if support is offered to them throughout the treatment process.  How the treatment works to prevent relapse. WHERE ELSE CAN I GET HELP?  Your health care provider. Ask him or her to help you find addiction treatment. These discussions are confidential.  The [employer] on Alcoholism and Drug Dependence (NCADD). This group has information about treatment centers and programs for people who have an addiction and for family members.  The telephone number is 1-800-NCA-CALL (763-712-19821-346 347 3348).  The website is https://ncadd.org/about-ncadd/our-affiliates  The Substance Abuse and Mental Health Services Administration [facility](SAMHSA). This group will help you find publicly funded treatment centers, help hotlines, and counseling services near you.  The telephone number is 1-800-662-HELP (734-179-83471-808 614 9523).  The website is www.findtreatment.RockToxic.plsamhsa.gov In countries outside of the [place], look in [employer] directories for contact information for services in your area.   This information is not intended to replace advice given to you by your health care provider. Make sure you discuss any questions you have with your health care provider.   Document Released: 10/14/2005 Document Revised: 08/06/2015 Document Reviewed: 09/03/2014 Elsevier Interactive Patient Education [employer] Elsevier Inc.

## 2015-09-26 NOTE — Progress Notes (Signed)
Subjective:  This chart was scribed for Norberto Sorenson, MD by Andrew Au, ED Scribe. This patient was seen in room 14 and the patient's care was started at 5:15 PM.   Patient ID: [name], male    DOB: 1946/04/22, 69 y.o.   MRN: [id number]  HPI Chief Complaint  Patient presents with  . Hospitalization Follow-up   HPI Comments: [name] is a 69 y.o. male who presents to the [facility] for hospitalization f/u. He was seen in the ED 2 days ago due to HTN and epistaxis. BP at that that time 221/97. States BP becomes elevated with panic takes. He was initially taking xanax as needed for  panic attacks but came off medication 6-7 months ago thinking he could manage anxiety on his own. Instead increased alcohol intake to daily  to suppress his stress but states he stopped drinking 2 days ago after hospitalization. He has been told in the past to sustain alcohol intake. States he can do this on his own and is currently not in counseling or AA meetings. Pt would like to go back to taking xanax.   Past Medical History  Diagnosis Date  . Hypertension   . Coronary artery disease   . High cholesterol   . Anxiety   . Panic attacks   . Anginal pain (HCC) 01/2005  . History of bronchitis     "used to get it alot" (08/25/2012)  . Arthritis     "knees"  . Chronic lower back pain   . PTSD (post-traumatic stress disorder)     "have been treated in the past" (08/25/2012)  . Depression    Allergies  Allergen Reactions  . Codeine Palpitations  . Vicodin [Hydrocodone-Acetaminophen] Swelling, Palpitations and Nausea And Vomiting   Prior to Admission medications   Medication Sig Start Date End Date Taking? Authorizing Provider  ALPRAZolam Prudy Feeler) 0.5 MG tablet Take 1 tablet (0.5 mg total) by mouth at bedtime as needed for anxiety. 09/24/15  Yes Eyvonne Mechanic, PA-C  aspirin 325 MG EC tablet Take 325 mg by mouth daily.   Yes Historical Provider, MD  metoprolol succinate  (TOPROL-XL) 50 MG 24 hr tablet Take 1 tablet (50 mg total) by mouth daily. 09/18/15  Yes Lyn Records, MD  NITROSTAT 0.4 MG SL tablet DISSOLVE ONE TABLET UNDER THE TONGUE SUBLINGUAL AS NEEDED FOR 30 DAYS 08/01/15  Yes Lyn Records, MD  acetaminophen (TYLENOL) 500 MG tablet Take 1 tablet (500 mg total) by mouth every 6 (six) hours as needed for fever. Patient not taking: Reported on 05/14/2015 12/14/14   Antony Madura, PA-C  dicyclomine (BENTYL) 10 MG capsule Take 1 capsule (10 mg total) by mouth 4 (four) times daily -  before meals and at bedtime. Patient not taking: Reported on 09/26/2015 12/18/14   Peyton Najjar, MD  pantoprazole (PROTONIX) 40 MG tablet Take 1 tablet (40 mg total) by mouth daily. Patient not taking: Reported on 05/14/2015 01/08/15   Janetta Hora, PA-C   Depression screen [facility] 2/9 09/26/2015 05/14/2015  Decreased Interest 0 0  Down, Depressed, Hopeless 0 0  PHQ - 2 Score 0 0      Review of Systems  Constitutional: Negative for fever and chills.  Eyes: Negative for visual disturbance.  Respiratory: Negative for shortness of breath.   Cardiovascular: Negative for chest pain and leg swelling.  Gastrointestinal: Negative for nausea, vomiting, abdominal pain, diarrhea, constipation, blood in stool, abdominal distention and anal  bleeding.  Skin: Negative for color change.  Neurological: Negative for dizziness, syncope, facial asymmetry, weakness, light-headedness and headaches.  Psychiatric/Behavioral: Positive for behavioral problems and sleep disturbance. Negative for suicidal ideas, confusion, self-injury, dysphoric mood and agitation. The patient is nervous/anxious.        Objective:   Physical Exam  Constitutional: He is oriented to person, place, and time. He appears well-developed and well-nourished. No distress.  HENT:  Head: Normocephalic and atraumatic.  Eyes: Conjunctivae and EOM are normal.  Neck: Neck supple.  Cardiovascular: Normal rate.   Pulmonary/Chest:  Effort normal.  Musculoskeletal: Normal range of motion.  Neurological: He is alert and oriented to person, place, and time.  Skin: Skin is warm and dry.  Psychiatric: He has a normal mood and affect. His behavior is normal.  Nursing note and vitals reviewed.  Filed Vitals:   09/26/15 1655  BP: 137/60  Pulse: 74  Temp: 98.3 F (36.8 C)  TempSrc: Oral  Resp: 20  Height: 5\' 11"  (1.803 m)  Weight: 242 lb 12.8 oz (110.133 kg)  SpO2: 97%   Assessment & Plan:    Recommended to to establish care with a psychiatrist to help manage PTSD and anxiety.   1. Alcohol abuse   2. Alcoholic hepatitis without ascites   3. Anxiety state   4. PTSD (post-traumatic stress disorder)   5. Elevated glucose   6. Thrombocytopenia (HCC)    Spent extensive time reviewing need to stop alcohol use - pt feels that he will not have trouble stopping - that he hadn't as he hadn't relized the severity of alcohom  Orders Placed This Encounter  Procedures  . US ABDOMEN COMPLETE W/ELASTOGRAPHY    Standing Status: Future     Number of Occurrences:      Standing Expiration Date: 11/25/2016    Order Specific Question:  Reason for Exam (SYMPTOM  OR DIAGNOSIS REQUIRED)    Answer:  persistent transaminitis with recurrent alcohol abuse    Order Specific Question:  Preferred imaging location?    Answer:  [facility]  . Ethanol  . Prescript Monitor Profile(10)  . Hepatic Function Panel  . Hemoglobin A1c    Meds ordered this encounter  Medications  . ALPRAZolam (XANAX) 0.5 MG tablet    Sig: Take 1 tablet (0.5 mg total) by mouth 2 (two) times daily as needed for anxiety.    Dispense:  20 tablet    Refill:  0    By signing my name below, I, Raven Small, attest that this documentation has been prepared under the direction and in the presence of Norberto SorensonEva Debborah Alonge, MD.  Electronically Signed: Andrew Auaven Small, ED Scribe. 09/26/2015. 5:59 PM. I personally performed the services described in this documentation, which was  scribed in my presence. The recorded information has been reviewed and considered, and addended by me as needed.  Norberto SorensonEva Fred Franzen, MD MPH

## 2015-09-27 LAB — HEPATIC FUNCTION PANEL
ALBUMIN: 3.8 g/dL (ref 3.6–5.1)
ALK PHOS: 54 U/L (ref 40–115)
ALT: 275 U/L — ABNORMAL HIGH (ref 9–46)
AST: 277 U/L — ABNORMAL HIGH (ref 10–35)
Bilirubin, Direct: 0.3 mg/dL — ABNORMAL HIGH (ref <=0.2)
Indirect Bilirubin: 0.6 mg/dL (ref 0.2–1.2)
TOTAL PROTEIN: 8.5 g/dL — AB (ref 6.1–8.1)
Total Bilirubin: 0.9 mg/dL (ref 0.2–1.2)

## 2015-09-28 LAB — HEMOGLOBIN A1C
Hgb A1c MFr Bld: 6.1 % — ABNORMAL HIGH (ref <5.7)
Mean Plasma Glucose: 128 mg/dL — ABNORMAL HIGH (ref <117)

## 2015-09-30 ENCOUNTER — Ambulatory Visit: Payer: Medicare Other | Admitting: Cardiology

## 2015-10-01 ENCOUNTER — Telehealth: Payer: Self-pay | Admitting: Family Medicine

## 2015-10-01 NOTE — Telephone Encounter (Signed)
Pt called in for his lab results.  He would like to speak with Dr. Clelia CroftShaw about his results.

## 2015-10-02 ENCOUNTER — Telehealth: Payer: Self-pay | Admitting: *Deleted

## 2015-10-02 LAB — BENZODIAZEPINES (GC/LC/MS), URINE
Alprazolam metabolite (GC/LC/MS), ur confirm: 73 ng/mL — AB (ref <25)
CLONAZEPAU: NEGATIVE ng/mL (ref <25)
FLURAZEPAMU: NEGATIVE ng/mL (ref <50)
LORAZEPAMU: NEGATIVE ng/mL (ref <50)
Midazolam (GC/LC/MS), ur confirm: NEGATIVE ng/mL (ref <50)
Nordiazepam (GC/LC/MS), ur confirm: NEGATIVE ng/mL (ref <50)
OXAZEPAMU: NEGATIVE ng/mL (ref <50)
TEMAZEPAMU: NEGATIVE ng/mL (ref <50)
Triazolam metabolite (GC/LC/MS), ur confirm: NEGATIVE ng/mL (ref <50)

## 2015-10-02 NOTE — Telephone Encounter (Signed)
Pt called and is concerned about his liver levels.  Alcohol results are back in your box in the Dr lounge, they are negative.  He are wondering why his numbers are going up and concerned about cirrhosis.

## 2015-10-03 LAB — PRESCRIPTION MONITORING PROFILE (10 PANEL)
AMPHETAMINE/METH: NEGATIVE ng/mL
BARBITURATE SCREEN, URINE: NEGATIVE ng/mL
BUPRENORPHINE, URINE: NEGATIVE ng/mL
CREATININE, URINE: 211.99 mg/dL (ref >20.0)
Cannabinoid Scrn, Ur: NEGATIVE ng/mL
Cocaine Metabolites: NEGATIVE ng/mL
Methadone Screen, Urine: NEGATIVE ng/mL
NITRITES URINE, INITIAL: NEGATIVE ug/mL
Opiate Screen, Urine: NEGATIVE ng/mL
Oxycodone Screen, Ur: NEGATIVE ng/mL
PH URINE, INITIAL: 5.5 pH (ref 4.5–8.9)
Propoxyphene: NEGATIVE ng/mL

## 2015-10-03 NOTE — Telephone Encounter (Signed)
Called pt and all questions answered - he is doing well - has only been using 1 alprazolam/d on average so has half of his bottle left.  Will come in fasting to repeat liver labs and lipid panel as well as med refill on Tuesday 11/8. Try low fat diet.

## 2015-10-03 NOTE — Telephone Encounter (Signed)
See other phone note. Called pt, questions answered. sched for US on 11/22.

## 2015-10-09 ENCOUNTER — Encounter: Payer: Self-pay | Admitting: Cardiology

## 2015-10-16 ENCOUNTER — Other Ambulatory Visit: Payer: Self-pay | Admitting: Interventional Cardiology

## 2015-10-21 ENCOUNTER — Ambulatory Visit (HOSPITAL_COMMUNITY): Payer: Medicare Other

## 2015-10-27 ENCOUNTER — Encounter: Payer: Self-pay | Admitting: Family Medicine

## 2015-11-07 ENCOUNTER — Encounter (HOSPITAL_COMMUNITY): Payer: Self-pay | Admitting: Family Medicine

## 2015-11-07 ENCOUNTER — Emergency Department (INDEPENDENT_AMBULATORY_CARE_PROVIDER_SITE_OTHER)
Admission: EM | Admit: 2015-11-07 | Discharge: 2015-11-07 | Disposition: A | Discharge status: Home / Self Care | Payer: Medicare Other | Source: Home / Self Care | Attending: Family Medicine | Admitting: Family Medicine

## 2015-11-07 DIAGNOSIS — J40 Bronchitis, not specified as acute or chronic: Secondary | ICD-10-CM

## 2015-11-07 DIAGNOSIS — J0110 Acute frontal sinusitis, unspecified: Secondary | ICD-10-CM

## 2015-11-07 MED ORDER — IPRATROPIUM BROMIDE 0.06 % NA SOLN: 2.0000 | Freq: Four times a day (QID) | NASAL | Status: DC

## 2015-11-07 MED ORDER — AMOXICILLIN-POT CLAVULANATE 875-125 MG PO TABS: 1.0000 | ORAL_TABLET | Freq: Two times a day (BID) | ORAL | Status: DC

## 2015-11-07 MED ORDER — ALPRAZOLAM 0.5 MG PO TABS
0.5000 mg | ORAL_TABLET | Freq: Two times a day (BID) | ORAL | Status: DC | PRN
Start: 2015-11-07 — End: 2015-12-09

## 2015-11-07 NOTE — Discharge Instructions (Signed)
You have sinusitis. Please start the antibiotics to clear the infection Use the atrovent to help with nasal congestion Use Delsym for cough Use mucinex to break up the phlegm

## 2015-11-07 NOTE — ED Provider Notes (Signed)
CSN: [id number]     Arrival date & time 11/07/15  1300 History   First MD Initiated Contact with Patient 11/07/15 1330     Chief Complaint  Patient presents with  . URI   (Consider location/radiation/quality/duration/timing/severity/associated sxs/prior Treatment) HPI  Runny nose, cough, congestion, and sore thorat. Mild phlegm w/ cough. 5th day. Getting worse. Now developing fevers. Constant. Slit very little Tylenol for fevers only.  Chest pain, shortness of breath, palpitations, nausea, vomiting, neck stiffness, rash, diarrhea, constipation.   and out of anxiety medications over the last couple days. Unable to get refill from primary care doctor until his next appointment which is scheduled in a couple weeks.   Past Medical History  Diagnosis Date  . Hypertension   . Coronary artery disease   . High cholesterol   . Anxiety   . Panic attacks   . Anginal pain (HCC) 01/2005  . History of bronchitis     "used to get it alot" (08/25/2012)  . Arthritis     "knees"  . Chronic lower back pain   . PTSD (post-traumatic stress disorder)     "have been treated in the past" (08/25/2012)  . Depression    Past Surgical History  Procedure Laterality Date  . Coronary artery bypass graft  01/2005    CABG X3  . Cardiac catheterization  01/2005  . Varicose vein surgery  1980's    LLE  . Shrapnel  1969    LLE; left lateral thumb (required grafting)   Family History  Problem Relation Age of Onset  . Diabetes Mother   . Heart disease Father   . Cancer Brother    Social History  Substance Use Topics  . Smoking status: Former Smoker -- 2.00 packs/day for 5 years  . Smokeless tobacco: Never Used     Comment: 08/25/2012 "quit smoking cigarettes 40 yr ago"  . Alcohol Use: Yes     Comment: social    Review of Systems Per HPI with all other pertinent systems negative.   Allergies  Codeine and Vicodin  Home Medications   Prior to Admission medications   Medication Sig Start Date End  Date Taking? Authorizing Provider  aspirin 325 MG EC tablet Take 325 mg by mouth daily.   Yes Historical Provider, MD  metoprolol succinate (TOPROL-XL) 25 MG 24 hr tablet TAKE ONE TABLET BY MOUTH ONCE DAILY FOR 30 DAYS 10/17/15  Yes Lyn RecordsHenry W Smith, MD  ALPRAZolam Prudy Feeler(XANAX) 0.5 MG tablet Take 1 tablet (0.5 mg total) by mouth 2 (two) times daily as needed for anxiety. 11/07/15   Ozella Rocksavid J Anjanette Gilkey, MD  amoxicillin-clavulanate (AUGMENTIN) 875-125 MG tablet Take 1 tablet by mouth 2 (two) times daily. 11/07/15   Ozella Rocksavid J Dellar Traber, MD  ipratropium (ATROVENT) 0.06 % nasal spray Place 2 sprays into both nostrils 4 (four) times daily. 11/07/15   Ozella Rocksavid J Braeden Dolinski, MD  metoprolol succinate (TOPROL-XL) 50 MG 24 hr tablet Take 1 tablet (50 mg total) by mouth daily. 09/18/15   Lyn RecordsHenry W Smith, MD  NITROSTAT 0.4 MG SL tablet DISSOLVE ONE TABLET UNDER THE TONGUE SUBLINGUAL AS NEEDED FOR 30 DAYS 08/01/15   Lyn RecordsHenry W Smith, MD  pantoprazole (PROTONIX) 40 MG tablet Take 1 tablet (40 mg total) by mouth daily. Patient not taking: Reported on 05/14/2015 01/08/15   Janetta HoraKathryn R Thompson, PA-C   Meds Ordered and Administered this Visit  Medications - No data to display  BP 144/84 mmHg  Pulse 76  Temp(Src) 98.3 F (36.8 C) (  Oral)  Resp 16  SpO2 97% No data found.   Physical Exam Physical Exam  Constitutional: oriented to person, place, and time. appears well-developed and well-nourished. No distress.  HENT:  Head: Maxillary and frontal sinuses tender to palpation with right greater than left Eyes: EOMI. PERRL.  Neck: Normal range of motion.  Cardiovascular: RRR, no m/r/g, 2+ distal pulses,  Pulmonary/Chest: Effort normal and breath sounds normal. No respiratory distress.  Abdominal: Soft. Bowel sounds are normal. NonTTP, no distension.  Musculoskeletal: Normal range of motion. Non ttp, no effusion.  Neurological: alert and oriented to person, place, and time.  Skin: Skin is warm. No rash noted. non diaphoretic.  Psychiatric:  normal mood and affect. behavior is normal. Judgment and thought content normal.   ED Course  Procedures (including critical care time)  Labs Review Labs Reviewed - No data to display  Imaging Review No results found.   Visual Acuity Review  Right Eye Distance:   Left Eye Distance:   Bilateral Distance:    Right Eye Near:   Left Eye Near:    Bilateral Near:         MDM   1. Bronchitis   2. Acute frontal sinusitis, recurrence not specified    Start Augmentin, Mucinex, Tylenol. 5 of patient's Xanax refilled until he can get in with his primary care physician.    Ozella Rocks, MD 11/07/15 1346

## 2015-11-07 NOTE — ED Notes (Signed)
C/o cold sx onset 4 days associated w/chest d/c, dry cough, sneezing, nasal congestion, runny nose Denies fevers, chills A&O x4... No acute distress.

## 2015-11-21 ENCOUNTER — Encounter (HOSPITAL_BASED_OUTPATIENT_CLINIC_OR_DEPARTMENT_OTHER): Payer: Self-pay | Admitting: *Deleted

## 2015-11-21 ENCOUNTER — Emergency Department (HOSPITAL_BASED_OUTPATIENT_CLINIC_OR_DEPARTMENT_OTHER)
Admission: EM | Admit: 2015-11-21 | Discharge: 2015-11-21 | Disposition: A | Discharge status: Home / Self Care | Payer: Medicare Other | Attending: Emergency Medicine | Admitting: Emergency Medicine

## 2015-11-21 ENCOUNTER — Emergency Department (HOSPITAL_BASED_OUTPATIENT_CLINIC_OR_DEPARTMENT_OTHER): Payer: Medicare Other

## 2015-11-21 DIAGNOSIS — Z8709 Personal history of other diseases of the respiratory system: Secondary | ICD-10-CM | POA: Insufficient documentation

## 2015-11-21 DIAGNOSIS — M1990 Unspecified osteoarthritis, unspecified site: Secondary | ICD-10-CM | POA: Insufficient documentation

## 2015-11-21 DIAGNOSIS — F329 Major depressive disorder, single episode, unspecified: Secondary | ICD-10-CM | POA: Insufficient documentation

## 2015-11-21 DIAGNOSIS — G8929 Other chronic pain: Secondary | ICD-10-CM | POA: Insufficient documentation

## 2015-11-21 DIAGNOSIS — Z8639 Personal history of other endocrine, nutritional and metabolic disease: Secondary | ICD-10-CM | POA: Insufficient documentation

## 2015-11-21 DIAGNOSIS — Z87891 Personal history of nicotine dependence: Secondary | ICD-10-CM | POA: Insufficient documentation

## 2015-11-21 DIAGNOSIS — M7989 Other specified soft tissue disorders: Secondary | ICD-10-CM | POA: Diagnosis not present

## 2015-11-21 DIAGNOSIS — M79605 Pain in left leg: Secondary | ICD-10-CM

## 2015-11-21 DIAGNOSIS — Z9889 Other specified postprocedural states: Secondary | ICD-10-CM | POA: Insufficient documentation

## 2015-11-21 DIAGNOSIS — Z951 Presence of aortocoronary bypass graft: Secondary | ICD-10-CM | POA: Diagnosis not present

## 2015-11-21 DIAGNOSIS — F410 Panic disorder [episodic paroxysmal anxiety] without agoraphobia: Secondary | ICD-10-CM | POA: Insufficient documentation

## 2015-11-21 DIAGNOSIS — I10 Essential (primary) hypertension: Secondary | ICD-10-CM | POA: Diagnosis not present

## 2015-11-21 DIAGNOSIS — Z79899 Other long term (current) drug therapy: Secondary | ICD-10-CM | POA: Diagnosis not present

## 2015-11-21 DIAGNOSIS — Z7982 Long term (current) use of aspirin: Secondary | ICD-10-CM | POA: Insufficient documentation

## 2015-11-21 DIAGNOSIS — I25119 Atherosclerotic heart disease of native coronary artery with unspecified angina pectoris: Secondary | ICD-10-CM | POA: Diagnosis not present

## 2015-11-21 LAB — BASIC METABOLIC PANEL
Anion gap: 8 (ref 5–15)
BUN: 22 mg/dL — ABNORMAL HIGH (ref 6–20)
CHLORIDE: 106 mmol/L (ref 101–111)
CO2: 25 mmol/L (ref 22–32)
CREATININE: 0.88 mg/dL (ref 0.61–1.24)
Calcium: 9.2 mg/dL (ref 8.9–10.3)
GFR calc non Af Amer: >60 mL/min (ref >60)
GLUCOSE: 104 mg/dL — AB (ref 65–99)
Potassium: 4 mmol/L (ref 3.5–5.1)
Sodium: 139 mmol/L (ref 135–145)

## 2015-11-21 LAB — CBC
HEMATOCRIT: 44 % (ref 39.0–52.0)
HEMOGLOBIN: 14.5 g/dL (ref 13.0–17.0)
MCH: 30.3 pg (ref 26.0–34.0)
MCHC: 33 g/dL (ref 30.0–36.0)
MCV: 92.1 fL (ref 78.0–100.0)
Platelets: 105 10*3/uL — ABNORMAL LOW (ref 150–400)
RBC: 4.78 MIL/uL (ref 4.22–5.81)
RDW: 12.7 % (ref 11.5–15.5)
WBC: 6.1 10*3/uL (ref 4.0–10.5)

## 2015-11-21 MED ORDER — OXYCODONE-ACETAMINOPHEN 5-325 MG PO TABS: 1.0000 | ORAL_TABLET | Freq: Four times a day (QID) | ORAL | Status: DC | PRN

## 2015-11-21 MED ORDER — NAPROXEN 500 MG PO TABS: 500.0000 mg | ORAL_TABLET | Freq: Two times a day (BID) | ORAL | Status: DC

## 2015-11-21 MED ORDER — OXYCODONE-ACETAMINOPHEN 5-325 MG PO TABS
1.0000 | ORAL_TABLET | Freq: Once | ORAL | Status: DC
  Filled 2015-11-21: qty 1

## 2015-11-21 NOTE — ED Notes (Signed)
Pt verbalizes understanding of d/c instructions and denies any further needs at this time. 

## 2015-11-21 NOTE — Discharge Instructions (Signed)
Return to the ED with any concerns including chest pain difficulty breathing, increased pain, fainting, decreased level of alertness/lethargy, or any other alarming symptoms

## 2015-11-21 NOTE — ED Notes (Signed)
Presents with LLE pain, primarily at lateral aspect of left knee. Onset 2 days ago, with gradual increase in pain over the past two days. States walking makes pain worse. Rest and elevation relieves LLE pain. Denies any pain at this time

## 2015-11-21 NOTE — ED Notes (Signed)
Denies any pain with plantar and dorsal flexion of rt or left lower extremities

## 2015-11-21 NOTE — ED Notes (Signed)
Patient transported to Ultrasound via stretcher, sr x 2 up 

## 2015-11-21 NOTE — ED Provider Notes (Signed)
CSN: [id number]     Arrival date & time 11/21/15  1647 History   First MD Initiated Contact with Patient 11/21/15 1836     Chief Complaint  Patient presents with  . Leg Pain     (Consider location/radiation/quality/duration/timing/severity/associated sxs/prior Treatment) HPI  Pt presents with c/o left lower extremity pain.  He describes pain and swelling behind his left knee that began yesterday.  Also c/o pain in left calf.  He sees that left leg is more swollen than the right as well.  He has had no trauma. He is able to bear weight.  No chest pain or difficulty breathing.  No hx of blood clots.  Has been elevating his leg which has helped somewhat with the pain.  He has not had any treatment prior to arrival.  There are no other associated systemic symptoms, there are no other alleviating or modifying factors.   Past Medical History  Diagnosis Date  . Hypertension   . Coronary artery disease   . High cholesterol   . Anxiety   . Panic attacks   . Anginal pain (HCC) 01/2005  . History of bronchitis     "used to get it alot" (08/25/2012)  . Arthritis     "knees"  . Chronic lower back pain   . PTSD (post-traumatic stress disorder)     "have been treated in the past" (08/25/2012)  . Depression    Past Surgical History  Procedure Laterality Date  . Coronary artery bypass graft  01/2005    CABG X3  . Cardiac catheterization  01/2005  . Varicose vein surgery  1980's    LLE  . Shrapnel  1969    LLE; left lateral thumb (required grafting)   Family History  Problem Relation Age of Onset  . Diabetes Mother   . Heart disease Father   . Cancer Brother    Social History  Substance Use Topics  . Smoking status: Former Smoker -- 2.00 packs/day for 5 years  . Smokeless tobacco: Never Used     Comment: 08/25/2012 "quit smoking cigarettes 40 yr ago"  . Alcohol Use: Yes     Comment: social    Review of Systems  ROS reviewed and all otherwise negative except for mentioned in  HPI    Allergies  Codeine and Vicodin  Home Medications   Prior to Admission medications   Medication Sig Start Date End Date Taking? Authorizing Provider  ALPRAZolam Prudy Feeler) 0.5 MG tablet Take 1 tablet (0.5 mg total) by mouth 2 (two) times daily as needed for anxiety. 11/07/15   Ozella Rocks, MD  amoxicillin-clavulanate (AUGMENTIN) 875-125 MG tablet Take 1 tablet by mouth 2 (two) times daily. 11/07/15   Ozella Rocks, MD  aspirin 325 MG EC tablet Take 325 mg by mouth daily.    Historical Provider, MD  ipratropium (ATROVENT) 0.06 % nasal spray Place 2 sprays into both nostrils 4 (four) times daily. 11/07/15   Ozella Rocks, MD  metoprolol succinate (TOPROL-XL) 25 MG 24 hr tablet TAKE ONE TABLET BY MOUTH ONCE DAILY FOR 30 DAYS 10/17/15   Lyn Records, MD  metoprolol succinate (TOPROL-XL) 50 MG 24 hr tablet Take 1 tablet (50 mg total) by mouth daily. 09/18/15   Lyn Records, MD  naproxen (NAPROSYN) 500 MG tablet Take 1 tablet (500 mg total) by mouth 2 (two) times daily. 11/21/15   Jerelyn Scott, MD  NITROSTAT 0.4 MG SL tablet DISSOLVE ONE TABLET UNDER THE TONGUE SUBLINGUAL AS  NEEDED FOR 30 DAYS 08/01/15   Lyn RecordsHenry W Smith, MD  oxyCODONE-acetaminophen (PERCOCET/ROXICET) 5-325 MG tablet Take 1-2 tablets by mouth every 6 (six) hours as needed for severe pain. 11/21/15   Jerelyn ScottMartha Linker, MD  pantoprazole (PROTONIX) 40 MG tablet Take 1 tablet (40 mg total) by mouth daily. Patient not taking: Reported on 05/14/2015 01/08/15   Janetta HoraKathryn R Thompson, PA-C   BP 193/90 mmHg  Pulse 68  Temp(Src) 98.2 F (36.8 C) (Oral)  Resp 18  Ht 5\' 11"  (1.803 m)  Wt 248 lb (112.492 kg)  BMI 34.60 kg/m2  SpO2 100%  Vitals reviewed Physical Exam  Physical Examination: General appearance - alert, well appearing, and in no distress Mental status - alert, oriented to person, place, and time Eyes - no conjunctival injection, no scleral icterus Chest - clear to auscultation, no wheezes, rales or rhonchi, symmetric air  entry Heart - normal rate, regular rhythm, normal S1, S2, no murmurs, rubs, clicks or gallops Neurological - alert, oriented, normal speech Musculoskeletal - ttp in left popliteal fossa, no palpable cords, negative homans sign, otherwise no joint tenderness, deformity or swelling Extremities - peripheral pulses normal, trace pedal edema bilaterally, no clubbing or cyanosis Skin - normal coloration and turgor, no rashes  ED Course  Procedures (including critical care time) Labs Review Labs Reviewed  CBC - Abnormal; Notable for the following:    Platelets 105 (*)    All other components within normal limits  BASIC METABOLIC PANEL - Abnormal; Notable for the following:    Glucose, Bld 104 (*)    BUN 22 (*)    All other components within normal limits    Imaging Review [place] Venous Img Lower Unilateral Left  11/21/2015  CLINICAL DATA:  Left lower leg pain and swelling. History of greater saphenous vein stripping in 2006. EXAM: LEFT LOWER EXTREMITY VENOUS DOPPLER ULTRASOUND TECHNIQUE: Gray-scale sonography with graded compression, as well as color Doppler and duplex ultrasound were performed to evaluate the lower extremity deep venous systems from the level of the common femoral vein and including the common femoral, femoral, profunda femoral, popliteal and calf veins including the posterior tibial, peroneal and gastrocnemius veins when visible. The superficial great saphenous vein was also interrogated. Spectral Doppler was utilized to evaluate flow at rest and with distal augmentation maneuvers in the common femoral, femoral and popliteal veins. COMPARISON:  None. FINDINGS: Contralateral Common Femoral Vein: Respiratory phasicity is normal and symmetric with the symptomatic side. No evidence of thrombus. Normal compressibility. Common Femoral Vein: No evidence of thrombus. Normal compressibility, respiratory phasicity and response to augmentation. Saphenofemoral Junction: No evidence of thrombus.  Normal compressibility and flow on color Doppler imaging. Profunda Femoral Vein: No evidence of thrombus. Normal compressibility and flow on color Doppler imaging. Femoral Vein: No evidence of thrombus. Normal compressibility, respiratory phasicity and response to augmentation. Popliteal Vein: No evidence of thrombus. Normal compressibility, respiratory phasicity and response to augmentation. Calf Veins: No evidence of thrombus. Normal compressibility and flow on color Doppler imaging. Other Findings:  None. IMPRESSION: No evidence of acute left lower extremity DVT. Electronically Signed   By: Carey BullocksWilliam  Veazey M.D.   On: 11/21/2015 19:58   I have personally reviewed and evaluated these images and lab results as part of my medical decision-making.   EKG Interpretation None      MDM   Final diagnoses:  Lower extremity pain, left    Pt presenting with c/o pain in left lower extremity, ultrasound is negative for DVT.  There  has been no trauma.  Pt is hypertensive without evidence of end organ damage.  Advised close f/u with PMD.  Discharged with strict return precautions.  Pt agreeable with plan.    Jerelyn Scott, MD 11/22/15 [phone]

## 2015-11-21 NOTE — ED Notes (Signed)
Pt c/o left lower leg pain and swelling x 2 days.  

## 2015-11-25 ENCOUNTER — Other Ambulatory Visit: Payer: Self-pay | Admitting: Interventional Cardiology

## 2015-12-02 ENCOUNTER — Ambulatory Visit (INDEPENDENT_AMBULATORY_CARE_PROVIDER_SITE_OTHER): Payer: Medicare Other

## 2015-12-02 ENCOUNTER — Ambulatory Visit (INDEPENDENT_AMBULATORY_CARE_PROVIDER_SITE_OTHER): Payer: Medicare Other | Admitting: Urgent Care

## 2015-12-02 VITALS — BP 185/82 | HR 60 | Temp 98.2°F | Resp 17 | Ht 70.5 in | Wt 238.0 lb

## 2015-12-02 DIAGNOSIS — R7303 Prediabetes: Secondary | ICD-10-CM

## 2015-12-02 DIAGNOSIS — R030 Elevated blood-pressure reading, without diagnosis of hypertension: Secondary | ICD-10-CM | POA: Diagnosis not present

## 2015-12-02 DIAGNOSIS — M25562 Pain in left knee: Secondary | ICD-10-CM | POA: Insufficient documentation

## 2015-12-02 DIAGNOSIS — R358 Other polyuria: Secondary | ICD-10-CM

## 2015-12-02 DIAGNOSIS — R202 Paresthesia of skin: Secondary | ICD-10-CM

## 2015-12-02 DIAGNOSIS — E669 Obesity, unspecified: Secondary | ICD-10-CM | POA: Diagnosis not present

## 2015-12-02 DIAGNOSIS — I10 Essential (primary) hypertension: Secondary | ICD-10-CM

## 2015-12-02 DIAGNOSIS — R631 Polydipsia: Secondary | ICD-10-CM

## 2015-12-02 DIAGNOSIS — Z833 Family history of diabetes mellitus: Secondary | ICD-10-CM | POA: Diagnosis not present

## 2015-12-02 DIAGNOSIS — R200 Anesthesia of skin: Secondary | ICD-10-CM

## 2015-12-02 DIAGNOSIS — R3589 Other polyuria: Secondary | ICD-10-CM

## 2015-12-02 DIAGNOSIS — M1712 Unilateral primary osteoarthritis, left knee: Secondary | ICD-10-CM

## 2015-12-02 DIAGNOSIS — M179 Osteoarthritis of knee, unspecified: Secondary | ICD-10-CM

## 2015-12-02 LAB — POCT GLYCOSYLATED HEMOGLOBIN (HGB A1C): Hemoglobin A1C: 5.9

## 2015-12-02 MED ORDER — HYDROCODONE-ACETAMINOPHEN 5-325 MG PO TABS: 1.0000 | ORAL_TABLET | Freq: Three times a day (TID) | ORAL | Status: DC | PRN

## 2015-12-02 NOTE — Progress Notes (Signed)
MRN: [id number] DOB: 16-Oct-1946  Subjective:   [name] is a 70 y.o. male presenting for chief complaint of Knee Pain  Reports ~2 week history of intermittent left knee pain, knee buckling, reports that he had a fall today due to his knee buckling, has had swelling. Has tried otc ibuprofen x2 with minimal relief. Denies fever, redness, bony deformity, knee surgery, trauma. Has an appointment with [facility] on 12/09/2015.  Diabetes - reports family history of diabetes with his mother. Admits polydipsia, polyuria, recent weight loss, intermittent numbness and tingling of his hands and feet. Would like to be checked for diabetes today.  Elevated BP - reports history of HTN managed by Dr. Verdis PrimeHenry Smith, his cardiologist. Has an appointment with him on 12/08/2015. Currently managed with metoprolol.   [name] has a current medication list which includes the following prescription(s): aspirin, metoprolol succinate, metoprolol succinate, alprazolam, and nitrostat. Also is allergic to codeine and vicodin.  [name]  has a past medical history of Hypertension; Coronary artery disease; High cholesterol; Anxiety; Panic attacks; Anginal pain (HCC) (01/2005); History of bronchitis; Arthritis; Chronic lower back pain; PTSD (post-traumatic stress disorder); and Depression. Also  has past surgical history that includes Coronary artery bypass graft (01/2005); Cardiac catheterization (01/2005); Varicose vein surgery (1980's); and Shrapnel (1969).  Objective:   Vitals: BP 185/82 mmHg  Pulse 60  Temp(Src) 98.2 F (36.8 C) (Oral)  Resp 17  Ht 5' 10.5" (1.791 m)  Wt 238 lb (107.956 kg)  BMI 33.66 kg/m2  SpO2 98%  Physical Exam  Constitutional: He is oriented to person, place, and time. He appears well-developed and well-nourished.  HENT:  Mouth/Throat: Oropharynx is clear and moist.  Eyes: Pupils are equal, round, and reactive to light. No scleral icterus.  Cardiovascular: Normal rate, regular  rhythm and intact distal pulses.  Exam reveals no gallop and no friction rub.   No murmur heard. Pulmonary/Chest: No respiratory distress. He has no wheezes. He has no rales.  Abdominal: Soft. Bowel sounds are normal. He exhibits no distension and no mass. There is no tenderness.  Musculoskeletal: He exhibits no edema.       Left knee: He exhibits swelling (trace edema). He exhibits normal range of motion, no effusion, no ecchymosis, no deformity, no laceration, no erythema and normal patellar mobility. Tenderness found. Medial joint line, lateral joint line and patellar tendon tenderness noted.  Negative McMurray, anterior drawer test.  Neurological: He is alert and oriented to person, place, and time.  Skin: Skin is warm and dry. No rash noted. No erythema. No pallor.   Results for orders placed or performed in visit on 12/02/15 (from the past 24 hour(s))  POCT glycosylated hemoglobin (Hb A1C)     Status: None   Collection Time: 12/02/15  8:02 PM  Result Value Ref Range   Hemoglobin A1C 5.9    [facility] reading (PRIMARY) by  Dr. Alwyn RenHopper and PA-Dewana Ammirati. Left knee - multiple degenerative changes including joint space narrowing and osteophytes suggestive of osteoarthritis, please comment.  Assessment and Plan :   This case was precepted with Dr. Alwyn RenHopper.  1. Osteoarthritis of left knee, unspecified osteoarthritis type 2. Left knee pain - Proceed with [facility] evaluation. Provided patient with knee brace and crutches as in #9, 10 - F/u as needed  3. Pre-diabetes 4. Obesity 5. Polyuria 6. Family history of diabetes mellitus 7. Polydipsia 8. Numbness and tingling - Counseled on lifestyle changes including diet and knee-friendly exercises. - F/u with PCP  9. Essential hypertension 10. Elevated blood pressure reading - May be elevated due to knee pain, recommended patient wear knee brace for stability, support and crutches to avoid additional falls. - Patient insisted on opioid pain  medication despite risks and documented allergies to vicodin. I provided short term supply of hydrocodone and advised patient that I would not be refilling this again.  Wallis Bamberg, PA-C [facility] [phone] 12/02/2015 7:47 PM

## 2015-12-02 NOTE — Patient Instructions (Addendum)

## 2015-12-02 NOTE — Progress Notes (Signed)
Patient was seen with Ronald MaxinMike Mani, PA-C. The patient had taken a fall today. He turned wrong and twisted and fell and felt something pop in his left knee. He has an appointment with the orthopedist next week anyhow. His joint is been hurting him and he has had a very difficult time bearing weight at all since he fell this morning. The x-ray was examined and no fracture noted. He does have some arthritic changes of the joint, narrowing mildly medially, and irregularity of the patella. There is a small amount of spurring. Patient treatment plan was discussed and agreed upon, with a knee brace and crutches and ice and elevation. He is to see the orthopedist. He apparently cannot tolerate hydrocodone and had not taken any NSAIDs that he had been previously prescribed. Recommended primarily take NSAIDs gave him a few Percocet for short-term use will not continually refill that.  Peyton Najjaravid H Jasmina Gendron M.D.

## 2015-12-03 LAB — COMPREHENSIVE METABOLIC PANEL
ALBUMIN: 4.1 g/dL (ref 3.6–5.1)
ALT: 184 U/L — ABNORMAL HIGH (ref 9–46)
AST: 122 U/L — AB (ref 10–35)
Alkaline Phosphatase: 50 U/L (ref 40–115)
BUN: 18 mg/dL (ref 7–25)
CALCIUM: 9.6 mg/dL (ref 8.6–10.3)
CHLORIDE: 102 mmol/L (ref 98–110)
CO2: 26 mmol/L (ref 20–31)
Creat: 0.94 mg/dL (ref 0.70–1.25)
GLUCOSE: 84 mg/dL (ref 65–99)
POTASSIUM: 4.1 mmol/L (ref 3.5–5.3)
Sodium: 136 mmol/L (ref 135–146)
Total Bilirubin: 0.6 mg/dL (ref 0.2–1.2)
Total Protein: 8.7 g/dL — ABNORMAL HIGH (ref 6.1–8.1)

## 2015-12-04 ENCOUNTER — Telehealth: Payer: Self-pay

## 2015-12-04 DIAGNOSIS — M25562 Pain in left knee: Secondary | ICD-10-CM | POA: Diagnosis not present

## 2015-12-04 DIAGNOSIS — M25462 Effusion, left knee: Secondary | ICD-10-CM | POA: Diagnosis not present

## 2015-12-04 DIAGNOSIS — S8392XA Sprain of unspecified site of left knee, initial encounter: Secondary | ICD-10-CM | POA: Diagnosis not present

## 2015-12-04 DIAGNOSIS — M2242 Chondromalacia patellae, left knee: Secondary | ICD-10-CM | POA: Diagnosis not present

## 2015-12-04 NOTE — Telephone Encounter (Signed)
Letter ready to pick up. Needed a note from the dates he was seen here for his employer.

## 2015-12-04 NOTE — Telephone Encounter (Signed)
Patient needs a work excuse. Patient has been seeing an orthopaedist and he states that it hasn't been determined when he should return back to work. Patient just needs something to give to his job.

## 2015-12-08 ENCOUNTER — Ambulatory Visit: Payer: Medicare Other | Admitting: Physician Assistant

## 2015-12-09 ENCOUNTER — Ambulatory Visit (INDEPENDENT_AMBULATORY_CARE_PROVIDER_SITE_OTHER): Payer: Medicare Other | Admitting: Physician Assistant

## 2015-12-09 ENCOUNTER — Encounter: Payer: Self-pay | Admitting: Physician Assistant

## 2015-12-09 VITALS — BP 140/78 | HR 66 | Ht 71.0 in | Wt 233.4 lb

## 2015-12-09 DIAGNOSIS — F1010 Alcohol abuse, uncomplicated: Secondary | ICD-10-CM | POA: Diagnosis not present

## 2015-12-09 DIAGNOSIS — I10 Essential (primary) hypertension: Secondary | ICD-10-CM

## 2015-12-09 DIAGNOSIS — I25810 Atherosclerosis of coronary artery bypass graft(s) without angina pectoris: Secondary | ICD-10-CM | POA: Diagnosis not present

## 2015-12-09 DIAGNOSIS — E785 Hyperlipidemia, unspecified: Secondary | ICD-10-CM | POA: Diagnosis not present

## 2015-12-09 MED ORDER — NITROGLYCERIN 0.4 MG SL SUBL: 0.4000 mg | SUBLINGUAL_TABLET | SUBLINGUAL | Status: DC | PRN

## 2015-12-09 MED ORDER — METOPROLOL SUCCINATE ER 25 MG PO TB24: 25.0000 mg | ORAL_TABLET | Freq: Every day | ORAL | Status: DC

## 2015-12-09 NOTE — Assessment & Plan Note (Signed)
Stable without chest pain. Low risk Lexi scan Myoview 12/2014 EF 57%

## 2015-12-09 NOTE — Patient Instructions (Signed)
Your physician recommends that you continue on your current medications as directed. Please refer to the Current Medication list given to you today.  Your physician wants you to follow-up in: 6-12 months with Dr.Smith You will receive a reminder letter in the mail two months in advance. If you don't receive a letter, please call our office to schedule the follow-up appointment.  

## 2015-12-09 NOTE — Assessment & Plan Note (Signed)
Patient has been a heavy drinker for 6 years now but quit 3 weeks ago. His LFTs are coming down, blood pressure is improved, and edema resolved, and he is feeling so much better.

## 2015-12-09 NOTE — Assessment & Plan Note (Signed)
Patient can't take statins because of his increased LFTs.

## 2015-12-09 NOTE — Progress Notes (Signed)
Cardiology Office Note   Date:  12/09/2015   ID:  [name], DOB 05/10/1946, MRN [id number]  PCP:  Norberto SorensonSHAW,EVA, MD  Cardiologist: Dr. Verdis PrimeHenry [name]   Chief Complaint: Follow-up    History of Present Illness: Ronald SciaraJoseph M [name] is a 70 y.o. male who presents for follow-up. His history of CAD status post CABG 3 with early graft failure and redo CABG in 2006, hypertension, HLD, and obesity. He was seen in 12/2014 with recurrent chest pain, Lexi scan Myoview was low risk nuclear study with very subtle reversible change along the apical lateral distribution, no significant ischemia identified EF 57% normal wall motion. Medical therapy was continued.  Patient was in the ER 08/2015 with hypertension and nosebleed and in December with knee pain and a fall. Blood pressure was elevated because of panic attacks a he had stopped his Xanax. He then began drinking daily to suppress his stress. He developed alcoholic hepatitis without ascites. He has since stopped drinking.  Patient says he was drinking a fifth of whiskey a day. He quit 3 weeks ago and has lost 18 pounds. He feels so much better. He says he had some swelling in his abdomen and lower extremities which has resolved. His knee is feeling a lot better since he lost the weight. His liver functions have come down. He can't take statins because of his elevated liver functions. He denies any chest pain, palpitations, dyspnea, dyspnea on exertion, dizziness or presyncope   Past Medical History  Diagnosis Date  . Hypertension   . Coronary artery disease   . High cholesterol   . Anxiety   . Panic attacks   . Anginal pain (HCC) 01/2005  . History of bronchitis     "used to get it alot" (08/25/2012)  . Arthritis     "knees"  . Chronic lower back pain   . PTSD (post-traumatic stress disorder)     "have been treated in the past" (08/25/2012)  . Depression     Past Surgical History  Procedure Laterality Date  . Coronary artery bypass graft  01/2005     CABG X3  . Cardiac catheterization  01/2005  . Varicose vein surgery  1980's    LLE  . Shrapnel  1969    LLE; left lateral thumb (required grafting)     Current Outpatient Prescriptions  Medication Sig Dispense Refill  . aspirin 325 MG EC tablet Take 325 mg by mouth daily.    . metoprolol succinate (TOPROL-XL) 25 MG 24 hr tablet TAKE ONE TABLET BY MOUTH ONCE DAILY FOR 30 DAYS 30 tablet 1  . NITROSTAT 0.4 MG SL tablet DISSOLVE ONE TABLET UNDER THE TONGUE SUBLINGUAL AS NEEDED FOR 30 DAYS 25 tablet 3   No current facility-administered medications for this visit.    Allergies:   Codeine and Vicodin    Social History:  The patient  reports that he has quit smoking. He has never used smokeless tobacco. He reports that he drinks alcohol. He reports that he does not use illicit drugs.   Family History:  The patient's    family history includes Cancer in his brother; Diabetes in his mother; Heart disease in his father.    ROS:  Please see the history of present illness.   Otherwise, review of systems are positive for having MRI for knee pain on Saturday.   All other systems are reviewed and negative.    PHYSICAL EXAM: VS:  BP 140/78 mmHg  Pulse 66  Ht 5'  11" (1.803 m)  Wt 233 lb 6.4 oz (105.87 kg)  BMI 32.57 kg/m2  SpO2 98% , BMI Body mass index is 32.57 kg/(m^2). GEN: Well nourished, well developed, in no acute distress Neck: no JVD, HJR, carotid bruits, or masses Cardiac:  RRR; positive S4, 1/6 systolic murmur at the left sternal border, no rubs, thrill or heave,  Respiratory:  clear to auscultation bilaterally, normal work of breathing GI: soft, nontender, nondistended, + BS MS: no deformity or atrophy Extremities: without cyanosis, clubbing, edema, good distal pulses bilaterally.  Skin: warm and dry, no rash Neuro:  Strength and sensation are intact    EKG:  EKG is not ordered today. EKG reviewed from ER 09/24/15 showed normal sinus rhythm with nonspecific ST-T wave  changes  Recent Labs: 11/21/2015: Hemoglobin 14.5; Platelets 105* 12/02/2015: ALT 184*; BUN 18; Creat 0.94; Potassium 4.1; Sodium 136    Lipid Panel    Component Value Date/Time   CHOL 251* 08/26/2012 0500   TRIG 282* 08/26/2012 0500   HDL 36* 08/26/2012 0500   CHOLHDL 7.0 08/26/2012 0500   VLDL 56* 08/26/2012 0500   LDLCALC 159* 08/26/2012 0500      Wt Readings from Last 3 Encounters:  12/09/15 233 lb 6.4 oz (105.87 kg)  12/02/15 238 lb (107.956 kg)  11/21/15 248 lb (112.492 kg)      Other studies Reviewed: Additional studies/ records that were reviewed today include and review of the records demonstrates: Lexi scan Myoview 12/2014 Impression Exercise Capacity:  Lexiscan with no exercise. BP Response:  Normal blood pressure response. Clinical Symptoms:  Typical symptoms with Lexiscan ECG Impression:  No significant ST segment change suggestive of ischemia. Comparison with Prior Nuclear Study: No images to compare  Overall Impression:  Low risk stress nuclear study with very subtle reversible change along the apical lateral distribution. No evidence of significant ischemia identified..  LV Ejection Fraction: 57%.  LV Wall Motion:  NL LV Function; septal wall motion abnormality consistent with prior bypass surgery  SKAINS, MARK, MD  Review of the above records demonstrates: - 2D ECHO 01/2014- EF 50-55%, mod LVH, no RWMA, PA pk pressure 44. Trivial pericardial effusion. No tamponade physiology.   - NST (2007)- Stable perfusion study.  No evidence of myocardial infarction or ischemia. LVEF 62%     ASSESSMENT AND PLAN:  Coronary artery disease involving coronary bypass graft of native heart without angina pectoris Stable without chest pain. Low risk Lexi scan Myoview 12/2014 EF 57%  Essential hypertension Blood pressure is much better since he stopped drinking alcohol. Only on low-dose metoprolol.  Hyperlipidemia Patient can't take statins because of his increased  LFTs.  ETOH abuse Patient has been a heavy drinker for 6 years now but quit 3 weeks ago. His LFTs are coming down, blood pressure is improved, and edema resolved, and he is feeling so much better.    Elson Clan, PA-C  12/09/2015 11:30 AM    [facility] [address] [place], [place], [place]  [address] Phone: [phone]; Fax: [phone]

## 2015-12-09 NOTE — Assessment & Plan Note (Signed)
Blood pressure is much better since he stopped drinking alcohol. Only on low-dose metoprolol.

## 2015-12-10 ENCOUNTER — Encounter: Payer: Self-pay | Admitting: Urgent Care

## 2015-12-13 DIAGNOSIS — S8392XD Sprain of unspecified site of left knee, subsequent encounter: Secondary | ICD-10-CM | POA: Diagnosis not present

## 2015-12-22 DIAGNOSIS — M25562 Pain in left knee: Secondary | ICD-10-CM | POA: Diagnosis not present

## 2015-12-22 DIAGNOSIS — M2242 Chondromalacia patellae, left knee: Secondary | ICD-10-CM | POA: Diagnosis not present

## 2015-12-22 DIAGNOSIS — S8992XA Unspecified injury of left lower leg, initial encounter: Secondary | ICD-10-CM | POA: Diagnosis not present

## 2016-01-05 ENCOUNTER — Ambulatory Visit: Payer: Medicare Other | Admitting: Physical Therapy

## 2016-01-13 ENCOUNTER — Ambulatory Visit: Payer: Medicare Other | Attending: Sports Medicine | Admitting: Physical Therapy

## 2016-01-20 DIAGNOSIS — M2242 Chondromalacia patellae, left knee: Secondary | ICD-10-CM | POA: Diagnosis not present

## 2016-01-20 DIAGNOSIS — M2392 Unspecified internal derangement of left knee: Secondary | ICD-10-CM | POA: Diagnosis not present

## 2016-01-20 DIAGNOSIS — S83272D Complex tear of lateral meniscus, current injury, left knee, subsequent encounter: Secondary | ICD-10-CM | POA: Diagnosis not present

## 2016-01-20 DIAGNOSIS — M25562 Pain in left knee: Secondary | ICD-10-CM | POA: Diagnosis not present

## 2016-03-16 DIAGNOSIS — M25562 Pain in left knee: Secondary | ICD-10-CM | POA: Diagnosis not present

## 2016-04-01 DIAGNOSIS — M25562 Pain in left knee: Secondary | ICD-10-CM | POA: Diagnosis not present

## 2016-08-09 ENCOUNTER — Encounter (HOSPITAL_BASED_OUTPATIENT_CLINIC_OR_DEPARTMENT_OTHER): Payer: Self-pay | Admitting: Adult Health

## 2016-08-09 ENCOUNTER — Emergency Department (HOSPITAL_BASED_OUTPATIENT_CLINIC_OR_DEPARTMENT_OTHER)
Admission: EM | Admit: 2016-08-09 | Discharge: 2016-08-09 | Disposition: A | Discharge status: Home / Self Care | Payer: Medicare Other | Attending: Emergency Medicine | Admitting: Emergency Medicine

## 2016-08-09 DIAGNOSIS — Z7982 Long term (current) use of aspirin: Secondary | ICD-10-CM | POA: Diagnosis not present

## 2016-08-09 DIAGNOSIS — I10 Essential (primary) hypertension: Secondary | ICD-10-CM | POA: Insufficient documentation

## 2016-08-09 DIAGNOSIS — Y999 Unspecified external cause status: Secondary | ICD-10-CM | POA: Insufficient documentation

## 2016-08-09 DIAGNOSIS — Y939 Activity, unspecified: Secondary | ICD-10-CM | POA: Insufficient documentation

## 2016-08-09 DIAGNOSIS — I2510 Atherosclerotic heart disease of native coronary artery without angina pectoris: Secondary | ICD-10-CM | POA: Insufficient documentation

## 2016-08-09 DIAGNOSIS — W260XXA Contact with knife, initial encounter: Secondary | ICD-10-CM | POA: Insufficient documentation

## 2016-08-09 DIAGNOSIS — S61012A Laceration without foreign body of left thumb without damage to nail, initial encounter: Secondary | ICD-10-CM | POA: Diagnosis not present

## 2016-08-09 DIAGNOSIS — Z79899 Other long term (current) drug therapy: Secondary | ICD-10-CM | POA: Diagnosis not present

## 2016-08-09 DIAGNOSIS — Z87891 Personal history of nicotine dependence: Secondary | ICD-10-CM | POA: Diagnosis not present

## 2016-08-09 DIAGNOSIS — Y929 Unspecified place or not applicable: Secondary | ICD-10-CM | POA: Insufficient documentation

## 2016-08-09 MED ORDER — CEPHALEXIN 500 MG PO CAPS: 500.0000 mg | ORAL_CAPSULE | Freq: Four times a day (QID) | ORAL | 0 refills | Status: DC

## 2016-08-09 MED ORDER — TETANUS-DIPHTH-ACELL PERTUSSIS 5-2.5-18.5 LF-MCG/0.5 IM SUSP
0.5000 mL | Freq: Once | INTRAMUSCULAR | Status: DC
  Filled 2016-08-09: qty 0.5

## 2016-08-09 NOTE — Discharge Instructions (Signed)
Local wound care with bacitracin and dressing changes twice daily.  Keflex as prescribed.  Ibuprofen 600 mg every 6 hours as needed for pain.

## 2016-08-09 NOTE — ED Triage Notes (Signed)
Presents with laceration to left thumb from a knife this evening. Cms intact. unkown last tetanus

## 2016-08-09 NOTE — ED Provider Notes (Signed)
[facility] Provider Note   CSN: [id number] Arrival date & time: 08/09/16  2115  By signing my name below, I, Emmanuella Mensah, attest that this documentation has been prepared under the direction and in the presence of Geoffery Lyonsouglas Jacara Benito, MD. Electronically Signed: Angelene GiovanniEmmanuella Mensah, ED Scribe. 08/09/16. 10:16 PM.   History   Chief Complaint Chief Complaint  Patient presents with  . Laceration    HPI Comments: [name] is a 70 y.o. male with a hx of hypertension who presents to the Emergency Department complaining of a laceration to his left thumb s/p injury that occurred PTA. Pt explains that he was working on a car with a knife when he accidentally lacerated his thumb. No alleviating factors noted. Pt has not tried any medications PTA. He states that he is unsure of his last tetanus vaccine. No fever, chills, or any generalized rash.    The history is provided by the patient. No language interpreter was used.    Past Medical History:  Diagnosis Date  . Anginal pain (HCC) 01/2005  . Anxiety   . Arthritis    "knees"  . Chronic lower back pain   . Coronary artery disease   . Depression   . High cholesterol   . History of bronchitis    "used to get it alot" (08/25/2012)  . Hypertension   . Panic attacks   . PTSD (post-traumatic stress disorder)    "have been treated in the past" (08/25/2012)    Patient Active Problem List   Diagnosis Date Noted  . ETOH abuse 12/09/2015  . Left knee pain 12/02/2015  . Coronary artery disease involving coronary bypass graft of native heart without angina pectoris 08/07/2014  . Essential hypertension 08/07/2014  . Hyperlipidemia 08/07/2014    Past Surgical History:  Procedure Laterality Date  . CARDIAC CATHETERIZATION  01/2005  . CORONARY ARTERY BYPASS GRAFT  01/2005   CABG X3  . Shrapnel  1969   LLE; left lateral thumb (required grafting)  . VARICOSE VEIN SURGERY  1980's   LLE       Home Medications    Prior to  Admission medications   Medication Sig Start Date End Date Taking? Authorizing Provider  aspirin 325 MG EC tablet Take 325 mg by mouth daily.    Historical Provider, MD  metoprolol succinate (TOPROL-XL) 25 MG 24 hr tablet Take 1 tablet (25 mg total) by mouth daily. 12/09/15   Dyann KiefMichele M Lenze, PA-C  nitroGLYCERIN (NITROSTAT) 0.4 MG SL tablet Place 1 tablet (0.4 mg total) under the tongue every 5 (five) minutes as needed for chest pain. 12/09/15   Dyann KiefMichele M Lenze, PA-C    Family History Family History  Problem Relation Age of Onset  . Diabetes Mother   . Heart disease Father   . Cancer Brother     Social History Social History  Substance Use Topics  . Smoking status: Former Smoker    Packs/day: 2.00    Years: 5.00  . Smokeless tobacco: Never Used     Comment: 08/25/2012 "quit smoking cigarettes 40 yr ago"  . Alcohol use Yes     Comment: social     Allergies   Codeine and Vicodin [hydrocodone-acetaminophen]   Review of Systems Review of Systems  A complete 10 system review of systems was obtained and all systems are negative except as noted in the HPI and PMH.   Physical Exam Updated Vital Signs BP 182/90 (BP Location: Right Arm)   Pulse 61  Temp 97.6 F (36.4 C) (Oral)   Resp 18   Ht 5\' 11"  (1.803 m)   Wt 250 lb (113.4 kg)   SpO2 98%   BMI 34.87 kg/m   Physical Exam  Constitutional: He is oriented to person, place, and time. He appears well-developed and well-nourished.  HENT:  Head: Normocephalic.  Eyes: EOM are normal.  Neck: Normal range of motion.  Pulmonary/Chest: Effort normal.  Abdominal: He exhibits no distension.  Musculoskeletal: Normal range of motion.  Left thumb is noted to have a 1 cm laceration to the lateral aspect of the distal phalanx just before the nail.   Neurological: He is alert and oriented to person, place, and time.  Psychiatric: He has a normal mood and affect.  Nursing note and vitals reviewed.    ED Treatments / Results    DIAGNOSTIC STUDIES: Oxygen Saturation is 98% on RA, normal by my interpretation.    COORDINATION OF CARE: 10:10 PM- Pt advised of plan for treatment and pt agrees. Explained that pt's presentation does not warrant a laceration repair. Pt will receive dressing over laceration. Advised to use Ibuprofen or Tylenol for pain.    Labs (all labs ordered are listed, but only abnormal results are displayed) Labs Reviewed - No data to display  EKG  EKG Interpretation None       Radiology No results found.  Procedures Procedures (including critical care time)  Medications Ordered in ED Medications - No data to display   Initial Impression / Assessment and Plan / ED Course  Geoffery Lyons, MD has reviewed the triage vital signs and the nursing notes.  Pertinent labs & imaging results that were available during my care of the patient were reviewed by me and considered in my medical decision making (see chart for details).  Clinical Course    The patient has an approximately 1 cm laceration to his left thumb. Bleeding is controlled and I do not feel requires sutures. This will be treated with dressing changes, bacitracin, updated tetanus, and discharge. The patient is requesting antibiotics and is quite adamant about this. He will be given Keflex.  Final Clinical Impressions(s) / ED Diagnoses   Final diagnoses:  None    New Prescriptions New Prescriptions   No medications on file   I personally performed the services described in this documentation, which was scribed in my presence. The recorded information has been reviewed and is accurate.        Geoffery Lyons, MD 08/10/16 [phone]

## 2016-11-11 ENCOUNTER — Emergency Department (HOSPITAL_COMMUNITY)
Admission: EM | Admit: 2016-11-11 | Discharge: 2016-11-11 | Disposition: A | Discharge status: Home / Self Care | Payer: Medicare Other | Attending: Emergency Medicine | Admitting: Emergency Medicine

## 2016-11-11 ENCOUNTER — Encounter (HOSPITAL_COMMUNITY): Payer: Self-pay

## 2016-11-11 DIAGNOSIS — R040 Epistaxis: Secondary | ICD-10-CM

## 2016-11-11 DIAGNOSIS — Z87891 Personal history of nicotine dependence: Secondary | ICD-10-CM | POA: Diagnosis not present

## 2016-11-11 DIAGNOSIS — Z951 Presence of aortocoronary bypass graft: Secondary | ICD-10-CM | POA: Insufficient documentation

## 2016-11-11 DIAGNOSIS — F1010 Alcohol abuse, uncomplicated: Secondary | ICD-10-CM | POA: Diagnosis not present

## 2016-11-11 DIAGNOSIS — I159 Secondary hypertension, unspecified: Secondary | ICD-10-CM | POA: Diagnosis not present

## 2016-11-11 DIAGNOSIS — I2510 Atherosclerotic heart disease of native coronary artery without angina pectoris: Secondary | ICD-10-CM | POA: Diagnosis not present

## 2016-11-11 DIAGNOSIS — Z7982 Long term (current) use of aspirin: Secondary | ICD-10-CM | POA: Insufficient documentation

## 2016-11-11 DIAGNOSIS — I10 Essential (primary) hypertension: Secondary | ICD-10-CM | POA: Diagnosis not present

## 2016-11-11 DIAGNOSIS — R030 Elevated blood-pressure reading, without diagnosis of hypertension: Secondary | ICD-10-CM | POA: Diagnosis not present

## 2016-11-11 LAB — COMPREHENSIVE METABOLIC PANEL
ALBUMIN: 3.7 g/dL (ref 3.5–5.0)
ALK PHOS: 50 U/L (ref 38–126)
ALT: 155 U/L — ABNORMAL HIGH (ref 17–63)
ANION GAP: 13 (ref 5–15)
AST: 128 U/L — ABNORMAL HIGH (ref 15–41)
BILIRUBIN TOTAL: 0.7 mg/dL (ref 0.3–1.2)
BUN: 22 mg/dL — ABNORMAL HIGH (ref 6–20)
CO2: 20 mmol/L — ABNORMAL LOW (ref 22–32)
Calcium: 9.3 mg/dL (ref 8.9–10.3)
Chloride: 102 mmol/L (ref 101–111)
Creatinine, Ser: 1.05 mg/dL (ref 0.61–1.24)
GLUCOSE: 124 mg/dL — AB (ref 65–99)
POTASSIUM: 3.7 mmol/L (ref 3.5–5.1)
Sodium: 135 mmol/L (ref 135–145)
TOTAL PROTEIN: 8.8 g/dL — AB (ref 6.5–8.1)

## 2016-11-11 LAB — CBC
HEMATOCRIT: 45 % (ref 39.0–52.0)
HEMOGLOBIN: 14.6 g/dL (ref 13.0–17.0)
MCH: 30.4 pg (ref 26.0–34.0)
MCHC: 32.4 g/dL (ref 30.0–36.0)
MCV: 93.6 fL (ref 78.0–100.0)
Platelets: 111 10*3/uL — ABNORMAL LOW (ref 150–400)
RBC: 4.81 MIL/uL (ref 4.22–5.81)
RDW: 13.6 % (ref 11.5–15.5)
WBC: 5.8 10*3/uL (ref 4.0–10.5)

## 2016-11-11 MED ORDER — OXYMETAZOLINE HCL 0.05 % NA SOLN: 1.0000 | Freq: Once | NASAL | Status: DC

## 2016-11-11 NOTE — ED Triage Notes (Signed)
Pt from home with nose bleed staring about 1hrs ago that is now controled. Pt has history of hight blood press and with EMS was 201/86
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
## 2016-11-11 NOTE — Discharge Instructions (Signed)
Stop drinking alcohol. Please follow up with family doctor for elevated blood pressure and liver function.

## 2016-12-23 ENCOUNTER — Other Ambulatory Visit: Payer: Self-pay | Admitting: Physician Assistant

## 2016-12-23 ENCOUNTER — Other Ambulatory Visit: Payer: Self-pay | Admitting: *Deleted

## 2016-12-23 MED ORDER — METOPROLOL SUCCINATE ER 25 MG PO TB24: 25.0000 mg | ORAL_TABLET | Freq: Every day | ORAL | 0 refills | Status: DC

## 2016-12-23 NOTE — Telephone Encounter (Signed)
Rx refill sent to pharmacy. 

## 2016-12-28 NOTE — Progress Notes (Signed)
Cardiology Office Note    Date:  12/29/2016   ID:  [name], [name] 03-Jan-1946, MRN [id number]  PCP:  Norberto Sorenson, MD  Cardiologist: Lesleigh Noe, MD   Chief Complaint  Patient presents with  . Coronary Artery Disease    History of Present Illness:  [name] is a 71 y.o. male with a history of CAD s/p CABG x3, with early graft failure and re-do surgery (2006), HTN, HLD, and obesity who presents today with chest pain.   [name] has anxiety. He is still working in [employer]. He can't take more than 50 mg of metoprolol inhaler because it causes him to feel lethargic. He is unable to tolerate statin therapy. He is not having any chest pain or shortness of breath. He drinks heavily. He is gaining weight above his ideal. For greater than a year he has been greater than 250 pounds. Diet has been poor.   Past Medical History:  Diagnosis Date  . Anginal pain (HCC) 01/2005  . Anxiety   . Arthritis    "knees"  . Chronic lower back pain   . Coronary artery disease   . Depression   . High cholesterol   . History of bronchitis    "used to get it alot" (08/25/2012)  . Hypertension   . Panic attacks   . PTSD (post-traumatic stress disorder)    "have been treated in the past" (08/25/2012)    Past Surgical History:  Procedure Laterality Date  . CARDIAC CATHETERIZATION  01/2005  . CORONARY ARTERY BYPASS GRAFT  01/2005   CABG X3  . Shrapnel  1969   LLE; left lateral thumb (required grafting)  . VARICOSE VEIN SURGERY  1980's   LLE    Current Medications: Outpatient Medications Prior to Visit  Medication Sig Dispense Refill  . aspirin 325 MG EC tablet Take 325 mg by mouth daily.    . cephALEXin (KEFLEX) 500 MG capsule Take 1 capsule (500 mg total) by mouth 4 (four) times daily. 28 capsule 0  . metoprolol succinate (TOPROL-XL) 25 MG 24 hr tablet Take 1 tablet (25 mg total) by mouth daily. 90 tablet 0  . nitroGLYCERIN (NITROSTAT) 0.4 MG SL tablet Place 1 tablet (0.4 mg total)  under the tongue every 5 (five) minutes as needed for chest pain. 25 tablet 3   No facility-administered medications prior to visit.      Allergies:   Codeine and Vicodin [hydrocodone-acetaminophen]   Social History   Social History  . Marital status: Married    Spouse name: N/A  . Number of children: N/A  . Years of education: N/A   Social History Main Topics  . Smoking status: Former Smoker    Packs/day: 2.00    Years: 5.00  . Smokeless tobacco: Never Used     Comment: 08/25/2012 "quit smoking cigarettes 40 yr ago"  . Alcohol use Yes     Comment: social  . Drug use: No     Comment: 08/25/2012 "last marijuana 10 years ago"  . Sexual activity: Yes   Other Topics Concern  . Not on file   Social History Narrative  . No narrative on file     Family History:  The patient's family history includes Cancer in his brother; Diabetes in his mother; Heart disease in his father.   ROS:   Please see the history of present illness.    Anxiety. When he gets ages he uses alcohol to help calm him down. He  was previously on benzodiazepines but he did not want to become attached/addicted to medication.  All other systems reviewed and are negative.   PHYSICAL EXAM:   VS:  BP (!) 164/92   Pulse 64   Ht 5\' 11"  (1.803 m)   Wt 250 lb (113.4 kg)   BMI 34.87 kg/m    GEN: Well nourished, well developed, in no acute distress  HEENT: normal  Neck: no JVD, carotid bruits, or masses Cardiac: RRR; no murmurs, rubs, or gallops,no edema  Respiratory:  clear to auscultation bilaterally, normal work of breathing GI: soft, nontender, nondistended, + BS MS: no deformity or atrophy  Skin: warm and dry, no rash Neuro:  Alert and Oriented x 3, Strength and sensation are intact Psych: euthymic mood, full affect  Wt Readings from Last 3 Encounters:  12/29/16 250 lb (113.4 kg)  11/11/16 240 lb (108.9 kg)  08/09/16 250 lb (113.4 kg)      Studies/Labs Reviewed:   EKG:  EKG  Normal sinus rhythm,  nonspecific T wave flattening, poor R-wave progression, no acute abnormality. No change compared to prior.  Recent Labs: 11/11/2016: ALT 155; BUN 22; Creatinine, Ser 1.05; Hemoglobin 14.6; Platelets 111; Potassium 3.7; Sodium 135   Lipid Panel    Component Value Date/Time   CHOL 251 (H) 08/26/2012 0500   TRIG 282 (H) 08/26/2012 0500   HDL 36 (L) 08/26/2012 0500   CHOLHDL 7.0 08/26/2012 0500   VLDL 56 (H) 08/26/2012 0500   LDLCALC 159 (H) 08/26/2012 0500    Additional studies/ records that were reviewed today include:  I chosen against repeating a lipid panel since he will not allow treatment.    ASSESSMENT:    1. Coronary artery disease involving coronary bypass graft of native heart without angina pectoris   2. Essential hypertension   3. Other hyperlipidemia   4. ETOH abuse      PLAN:  In order of problems listed above:  1. No angina and EKG is stable. Continue to monitor for symptoms. We had a long conversation concerning risk factor modification. We have been unable to pharmacologically control his lipids and blood pressure because of medication intolerance and a desire to his part 2 avoid medications.  2. Target blood pressure is 140/90 millimeters mercury or less. 2 g sodium diet, aerobic activity, decreased caloric and alcohol intake all strongly encouraged.  3. Decided against lipid monitoring as the patient has refused therapy in the past.  4. Encouraged discontinuation of alcohol and reestablishing with the primary care physician to help manage anxiety associated with alcohol withdrawal   All in all, [name] is 13 years post coronary bypass surgery. Risk factor modification has not been achieved on any chronic level. Anxiety and alcohol use or problems. I've recommended that he reestablish with primary care. Clinical follow-up for coronary disease in blood pressure management in one year.  Medication Adjustments/Labs and Tests Ordered: Current medicines are reviewed  at length with the patient today.  Concerns regarding medicines are outlined above.  Medication changes, Labs and Tests ordered today are listed in the Patient Instructions below. There are no Patient Instructions on file for this visit.   Signed, Lesleigh NoeHenry W Zenon Leaf III, MD  12/29/2016 4:40 PM    [facility] [address], [place], [place]  [address] Phone: [phone]; Fax: [phone]

## 2016-12-29 ENCOUNTER — Encounter: Payer: Self-pay | Admitting: *Deleted

## 2016-12-29 ENCOUNTER — Encounter (INDEPENDENT_AMBULATORY_CARE_PROVIDER_SITE_OTHER): Payer: Self-pay

## 2016-12-29 ENCOUNTER — Encounter: Payer: Self-pay | Admitting: Interventional Cardiology

## 2016-12-29 ENCOUNTER — Ambulatory Visit (INDEPENDENT_AMBULATORY_CARE_PROVIDER_SITE_OTHER): Payer: Medicare Other | Admitting: Interventional Cardiology

## 2016-12-29 VITALS — BP 164/92 | HR 64 | Ht 71.0 in | Wt 250.0 lb

## 2016-12-29 DIAGNOSIS — I10 Essential (primary) hypertension: Secondary | ICD-10-CM

## 2016-12-29 DIAGNOSIS — E784 Other hyperlipidemia: Secondary | ICD-10-CM | POA: Diagnosis not present

## 2016-12-29 DIAGNOSIS — I25810 Atherosclerosis of coronary artery bypass graft(s) without angina pectoris: Secondary | ICD-10-CM

## 2016-12-29 DIAGNOSIS — E7849 Other hyperlipidemia: Secondary | ICD-10-CM

## 2016-12-29 DIAGNOSIS — F1010 Alcohol abuse, uncomplicated: Secondary | ICD-10-CM | POA: Diagnosis not present

## 2016-12-29 NOTE — Patient Instructions (Signed)
Medication Instructions:  None  Labwork: None  Testing/Procedures: None  Follow-Up: Your physician wants you to follow-up in: 1 year with Dr. Smith. You will receive a reminder letter in the mail two months in advance. If you don't receive a letter, please call our office to schedule the follow-up appointment.   Any Other Special Instructions Will Be Listed Below (If Applicable).   Low-Sodium Eating Plan Sodium raises blood pressure and causes water to be held in the body. Getting less sodium from food will help lower your blood pressure, reduce any swelling, and protect your heart, liver, and kidneys. We get sodium by adding salt (sodium chloride) to food. Most of our sodium comes from canned, boxed, and frozen foods. Restaurant foods, fast foods, and pizza are also very high in sodium. Even if you take medicine to lower your blood pressure or to reduce fluid in your body, getting less sodium from your food is important. What is my plan? Most people should limit their sodium intake to 2,300 mg a day. Your health care provider recommends that you limit your sodium intake to _2g/2,000mg_ a day. What do I need to know about this eating plan? For the low-sodium eating plan, you will follow these general guidelines:  Choose foods with a % Daily Value for sodium of less than 5% (as listed on the food label).  Use salt-free seasonings or herbs instead of table salt or sea salt.  Check with your health care provider or pharmacist before using salt substitutes.  Eat fresh foods.  Eat more vegetables and fruits.  Limit canned vegetables. If you do use them, rinse them well to decrease the sodium.  Limit cheese to 1 oz (28 g) per day.  Eat lower-sodium products, often labeled as "lower sodium" or "no salt added."  Avoid foods that contain monosodium glutamate (MSG). MSG is sometimes added to Chinese food and some canned foods.  Check food labels (Nutrition Facts labels) on foods to  learn how much sodium is in one serving.  Eat more home-cooked food and less restaurant, buffet, and fast food.  When eating at a restaurant, ask that your food be prepared with less salt, or no salt if possible. How do I read food labels for sodium information? The Nutrition Facts label lists the amount of sodium in one serving of the food. If you eat more than one serving, you must multiply the listed amount of sodium by the number of servings. Food labels may also identify foods as:  Sodium free-Less than 5 mg in a serving.  Very low sodium-35 mg or less in a serving.  Low sodium-140 mg or less in a serving.  Light in sodium-50% less sodium in a serving. For example, if a food that usually has 300 mg of sodium is changed to become light in sodium, it will have 150 mg of sodium.  Reduced sodium-25% less sodium in a serving. For example, if a food that usually has 400 mg of sodium is changed to reduced sodium, it will have 300 mg of sodium. What foods can I eat? Grains  Low-sodium cereals, including oats, puffed wheat and rice, and shredded wheat cereals. Low-sodium crackers. Unsalted rice and pasta. Lower-sodium bread. Vegetables  Frozen or fresh vegetables. Low-sodium or reduced-sodium canned vegetables. Low-sodium or reduced-sodium tomato sauce and paste. Low-sodium or reduced-sodium tomato and vegetable juices. Fruits  Fresh, frozen, and canned fruit. Fruit juice. Meat and Other Protein Products  Low-sodium canned tuna and salmon. Fresh or frozen meat,   poultry, seafood, and fish. Lamb. Unsalted nuts. Dried beans, peas, and lentils without added salt. Unsalted canned beans. Homemade soups without salt. Eggs. Dairy  Milk. Soy milk. Ricotta cheese. Low-sodium or reduced-sodium cheeses. Yogurt. Condiments  Fresh and dried herbs and spices. Salt-free seasonings. Onion and garlic powders. Low-sodium varieties of mustard and ketchup. Fresh or refrigerated horseradish. Lemon juice. Fats  and Oils  Reduced-sodium salad dressings. Unsalted butter. Other  Unsalted popcorn and pretzels. The items listed above may not be a complete list of recommended foods or beverages. Contact your dietitian for more options.  What foods are not recommended? Grains  Instant hot cereals. Bread stuffing, pancake, and biscuit mixes. Croutons. Seasoned rice or pasta mixes. Noodle soup cups. Boxed or frozen macaroni and cheese. Self-rising flour. Regular salted crackers. Vegetables  Regular canned vegetables. Regular canned tomato sauce and paste. Regular tomato and vegetable juices. Frozen vegetables in sauces. Salted French fries. Olives. Pickles. Relishes. Sauerkraut. Salsa. Meat and Other Protein Products  Salted, canned, smoked, spiced, or pickled meats, seafood, or fish. Bacon, ham, sausage, hot dogs, corned beef, chipped beef, and packaged luncheon meats. Salt pork. Jerky. Pickled herring. Anchovies, regular canned tuna, and sardines. Salted nuts. Dairy  Processed cheese and cheese spreads. Cheese curds. Blue cheese and cottage cheese. Buttermilk. Condiments  Onion and garlic salt, seasoned salt, table salt, and sea salt. Canned and packaged gravies. Worcestershire sauce. Tartar sauce. Barbecue sauce. Teriyaki sauce. Soy sauce, including reduced sodium. Steak sauce. Fish sauce. Oyster sauce. Cocktail sauce. Horseradish that you find on the shelf. Regular ketchup and mustard. Meat flavorings and tenderizers. Bouillon cubes. Hot sauce. Tabasco sauce. Marinades. Taco seasonings. Relishes. Fats and Oils  Regular salad dressings. Salted butter. Margarine. Ghee. Bacon fat. Other  Potato and tortilla chips. Corn chips and puffs. Salted popcorn and pretzels. Canned or dried soups. Pizza. Frozen entrees and pot pies. The items listed above may not be a complete list of foods and beverages to avoid. Contact your dietitian for more information.  This information is not intended to replace advice given to  you by your health care provider. Make sure you discuss any questions you have with your health care provider. Document Released: 05/07/2002 Document Revised: 04/22/2016 Document Reviewed: 09/19/2013 Elsevier Interactive Patient Education  2017 Elsevier Inc.    If you need a refill on your cardiac medications before your next appointment, please call your pharmacy.   

## 2017-01-18 ENCOUNTER — Ambulatory Visit (INDEPENDENT_AMBULATORY_CARE_PROVIDER_SITE_OTHER): Payer: Medicare Other | Admitting: Family Medicine

## 2017-01-18 VITALS — BP 144/60 | HR 64 | Temp 98.3°F | Resp 16 | Ht 71.0 in | Wt 243.0 lb

## 2017-01-18 DIAGNOSIS — J329 Chronic sinusitis, unspecified: Secondary | ICD-10-CM | POA: Diagnosis not present

## 2017-01-18 DIAGNOSIS — I25810 Atherosclerosis of coronary artery bypass graft(s) without angina pectoris: Secondary | ICD-10-CM

## 2017-01-18 DIAGNOSIS — H6592 Unspecified nonsuppurative otitis media, left ear: Secondary | ICD-10-CM

## 2017-01-18 MED ORDER — BENZONATATE 100 MG PO CAPS: 100.0000 mg | ORAL_CAPSULE | Freq: Three times a day (TID) | ORAL | 0 refills | Status: DC | PRN

## 2017-01-18 MED ORDER — AMOXICILLIN-POT CLAVULANATE 875-125 MG PO TABS: 1.0000 | ORAL_TABLET | Freq: Two times a day (BID) | ORAL | 0 refills | Status: DC

## 2017-01-18 NOTE — Progress Notes (Signed)
Patient ID: [name], male    DOB: 02-24-1946, 71 y.o.   MRN: [id number]  PCP: Norberto SorensonSHAW,EVA, MD  Chief Complaint  Patient presents with  . Flu like symptoms    Subjective:  HPI 71 year old male presents for evaluation of influenza like symptoms x 1 week. Patient reports sore throat, facial fullness, nasal congestion and drainage x1 week. Nose bleed, sneezing, and was sent home from work for being ill. Cough is productive. Denies shortness of breath or wheezing. Report left ear tenderness and fullness. Felt feverish. Has taken some over the counter medication without relief of symptoms.  Social History   Social History  . Marital status: Married    Spouse name: N/A  . Number of children: N/A  . Years of education: N/A   Occupational History  . Not on file.   Social History Main Topics  . Smoking status: Former Smoker    Packs/day: 2.00    Years: 5.00  . Smokeless tobacco: Never Used     Comment: 08/25/2012 "quit smoking cigarettes 40 yr ago"  . Alcohol use Yes     Comment: social  . Drug use: No     Comment: 08/25/2012 "last marijuana 10 years ago"  . Sexual activity: Yes   Other Topics Concern  . Not on file   Social History Narrative  . No narrative on file    Family History  Problem Relation Age of Onset  . Diabetes Mother   . Heart disease Father   . Cancer Brother    Review of Systems See HPI Patient Active Problem List   Diagnosis Date Noted  . ETOH abuse 12/09/2015  . Left knee pain 12/02/2015  . Coronary artery disease involving coronary bypass graft of native heart without angina pectoris 08/07/2014  . Essential hypertension 08/07/2014  . Hyperlipidemia 08/07/2014    Allergies  Allergen Reactions  . Codeine Palpitations  . Vicodin [Hydrocodone-Acetaminophen] Nausea And Vomiting, Swelling and Palpitations    Prior to Admission medications   Medication Sig Start Date End Date Taking? Authorizing Provider  aspirin 325 MG EC tablet Take  325 mg by mouth daily.   Yes Historical Provider, MD  cephALEXin (KEFLEX) 500 MG capsule Take 1 capsule (500 mg total) by mouth 4 (four) times daily. 08/09/16  Yes Geoffery Lyonsouglas Delo, MD  metoprolol succinate (TOPROL-XL) 25 MG 24 hr tablet Take 1 tablet (25 mg total) by mouth daily. 12/23/16  Yes Lyn RecordsHenry W Smith, MD  nitroGLYCERIN (NITROSTAT) 0.4 MG SL tablet Place 1 tablet (0.4 mg total) under the tongue every 5 (five) minutes as needed for chest pain. 12/09/15  Yes Dyann KiefMichele M Lenze, PA-C    Past Medical, Surgical Family and Social History reviewed and updated.    Objective:   Today's Vitals   01/18/17 1449  BP: (!) 144/60  Pulse: 64  Resp: 16  Temp: 98.3 F (36.8 C)  TempSrc: Oral  SpO2: 98%  Weight: 243 lb (110.2 kg)  Height: 5\' 11"  (1.803 m)    Wt Readings from Last 3 Encounters:  01/18/17 243 lb (110.2 kg)  12/29/16 250 lb (113.4 kg)  11/11/16 240 lb (108.9 kg)   Physical Exam  Constitutional: He is oriented to person, place, and time. He appears well-developed and well-nourished.  HENT:  Head: Normocephalic and atraumatic.  Right Ear: Hearing, tympanic membrane, external ear and ear canal normal.  Left Ear: There is tenderness. A middle ear effusion is present.  Nose: Mucosal edema, rhinorrhea and sinus tenderness  present. Right sinus exhibits frontal sinus tenderness. Left sinus exhibits frontal sinus tenderness.  Mouth/Throat: Uvula is midline and oropharynx is clear and moist.  Eyes: Conjunctivae are normal. Pupils are equal, round, and reactive to light.  Cardiovascular: Normal rate, regular rhythm, normal heart sounds and intact distal pulses.   Pulmonary/Chest: Effort normal and breath sounds normal.  Musculoskeletal: Normal range of motion.  Neurological: He is alert and oriented to person, place, and time.  Skin: Skin is warm and dry.  Psychiatric: He has a normal mood and affect. His behavior is normal. Judgment and thought content normal.     Assessment & Plan:  1.  Sinusitis, unspecified chronicity, unspecified location 2. Middle Ear Effusion -Start Augmentin 1 tablet twice daily with food to avoid stomach upset x 10 days.Complete all medication. -Benzonatate 100-200 mg up to three times daily as needed cough.  Return for follow-up if symptoms worsen or do not improve.   Godfrey Pick. Tiburcio Pea, MSN, FNP-C Primary Care at [facility] [phone]

## 2017-01-18 NOTE — Patient Instructions (Addendum)
Start Augmentin 1 tablet twice daily with food to avoid stomach upset x 10 days. Complete all medication.  Benzonatate 100-200 mg up to three times daily as needed cough.   IF you received an x-ray today, you will receive an invoice from [facility]. Please contact [facility] at [phone] with questions or concerns regarding your invoice.   IF you received labwork today, you will receive an invoice from [facility]. Please contact [facility] at [phone] with questions or concerns regarding your invoice.   Our billing staff will not be able to assist you with questions regarding bills from these companies.  You will be contacted with the lab results as soon as they are available. The fastest way to get your results is to activate your My Chart account. Instructions are located on the last page of this paperwork. If you have not heard from us regarding the results in 2 weeks, please contact this office.     Sinusitis, Adult Sinusitis is soreness and inflammation of your sinuses. Sinuses are hollow spaces in the bones around your face. They are located:  Around your eyes.  In the middle of your forehead.  Behind your nose.  In your cheekbones. Your sinuses and nasal passages are lined with a stringy fluid (mucus). Mucus normally drains out of your sinuses. When your nasal tissues get inflamed or swollen, the mucus can get trapped or blocked so air cannot flow through your sinuses. This lets bacteria, viruses, and funguses grow, and that leads to infection. Follow these instructions at home: Medicines  Take, use, or apply over-the-counter and prescription medicines only as told by your doctor. These may include nasal sprays.  If you were prescribed an antibiotic medicine, take it as told by your doctor. Do not stop taking the antibiotic even if you start to feel better. Hydrate and Humidify  Drink enough water to keep your pee (urine) clear or pale yellow.  Use a  cool mist humidifier to keep the humidity level in your home above 50%.  Breathe in steam for 10-15 minutes, 3-4 times a day or as told by your doctor. You can do this in the bathroom while a hot shower is running.  Try not to spend time in cool or dry air. Rest  Rest as much as possible.  Sleep with your head raised (elevated).  Make sure to get enough sleep each night. General instructions  Put a warm, moist washcloth on your face 3-4 times a day or as told by your doctor. This will help with discomfort.  Wash your hands often with soap and water. If there is no soap and water, use hand sanitizer.  Do not smoke. Avoid being around people who are smoking (secondhand smoke).  Keep all follow-up visits as told by your doctor. This is important. Contact a doctor if:  You have a fever.  Your symptoms get worse.  Your symptoms do not get better within 10 days. Get help right away if:  You have a very bad headache.  You cannot stop throwing up (vomiting).  You have pain or swelling around your face or eyes.  You have trouble seeing.  You feel confused.  Your neck is stiff.  You have trouble breathing. This information is not intended to replace advice given to you by your health care provider. Make sure you discuss any questions you have with your health care provider. Document Released: 05/03/2008 Document Revised: 07/11/2016 Document Reviewed: 09/10/2015 Elsevier Interactive Patient Education  2017 [employer]

## 2017-03-31 ENCOUNTER — Other Ambulatory Visit: Payer: Self-pay | Admitting: Interventional Cardiology

## 2017-07-04 ENCOUNTER — Emergency Department (HOSPITAL_COMMUNITY)
Admission: EM | Admit: 2017-07-04 | Discharge: 2017-07-04 | Disposition: A | Discharge status: Home / Self Care | Payer: Medicare Other | Attending: Emergency Medicine | Admitting: Emergency Medicine

## 2017-07-04 ENCOUNTER — Encounter (HOSPITAL_COMMUNITY): Payer: Self-pay | Admitting: Emergency Medicine

## 2017-07-04 DIAGNOSIS — Z7982 Long term (current) use of aspirin: Secondary | ICD-10-CM | POA: Diagnosis not present

## 2017-07-04 DIAGNOSIS — Z951 Presence of aortocoronary bypass graft: Secondary | ICD-10-CM | POA: Insufficient documentation

## 2017-07-04 DIAGNOSIS — I10 Essential (primary) hypertension: Secondary | ICD-10-CM | POA: Diagnosis not present

## 2017-07-04 DIAGNOSIS — Z87891 Personal history of nicotine dependence: Secondary | ICD-10-CM | POA: Insufficient documentation

## 2017-07-04 DIAGNOSIS — I2510 Atherosclerotic heart disease of native coronary artery without angina pectoris: Secondary | ICD-10-CM | POA: Insufficient documentation

## 2017-07-04 DIAGNOSIS — K047 Periapical abscess without sinus: Secondary | ICD-10-CM | POA: Insufficient documentation

## 2017-07-04 DIAGNOSIS — Z79899 Other long term (current) drug therapy: Secondary | ICD-10-CM | POA: Diagnosis not present

## 2017-07-04 DIAGNOSIS — K0889 Other specified disorders of teeth and supporting structures: Secondary | ICD-10-CM | POA: Diagnosis present

## 2017-07-04 DIAGNOSIS — K029 Dental caries, unspecified: Secondary | ICD-10-CM | POA: Insufficient documentation

## 2017-07-04 MED ORDER — BUPIVACAINE-EPINEPHRINE (PF) 0.5% -1:200000 IJ SOLN
1.8000 mL | Freq: Once | INTRAMUSCULAR | Status: AC
  Administered 2017-07-04: 1.8 mL
  Filled 2017-07-04: qty 1.8

## 2017-07-04 MED ORDER — ACETAMINOPHEN 325 MG PO TABS: 650.0000 mg | ORAL_TABLET | Freq: Four times a day (QID) | ORAL | 0 refills | Status: DC | PRN

## 2017-07-04 MED ORDER — PENICILLIN V POTASSIUM 500 MG PO TABS: 500.0000 mg | ORAL_TABLET | Freq: Four times a day (QID) | ORAL | 0 refills | Status: AC

## 2017-07-04 MED ORDER — OXYCODONE-ACETAMINOPHEN 5-325 MG PO TABS
1.0000 | ORAL_TABLET | Freq: Once | ORAL | Status: AC
  Administered 2017-07-04: 1 via ORAL
  Filled 2017-07-04: qty 1

## 2017-07-04 NOTE — ED Triage Notes (Signed)
Pt reports dental pain and swelling to R lower canine tooth area x 2-3 days. Pt states tooth broke 3-4 days. Pt has been using salt water rinses without relief. Pt states he is unable to sleep d/t discomfort.  Pt is a cardiac patient and was afraid to take OTC meds.

## 2017-07-04 NOTE — ED Triage Notes (Signed)
Pt from home. Sts he has a dental abscess to the front of his mouth. Broken tooth noted to the front lower area, no abscess or swelling noted. Rates pain @ a 10/10. Sts it is an ongoing problem but hasn't had a chance to get to the dentist. Pt A&O x4 on arrival.

## 2017-07-04 NOTE — ED Provider Notes (Signed)
[facility]-EMERGENCY DEPT Provider Note   CSN: [id number] Arrival date & time: 07/04/17  7829     History   Chief Complaint Chief Complaint  Patient presents with  . Dental Pain    HPI [name] is a 71 y.o. male.  HPI Pt comes in with cc of dental pain. Pt has hx of poor detnal hx. He reports that he started having pain in his mouth 3 days ago that has only gotten worse, and now he has increased swelling and pain. Pt has no n/v/f/c.  Past Medical History:  Diagnosis Date  . Anginal pain (HCC) 01/2005  . Anxiety   . Arthritis    "knees"  . Chronic lower back pain   . Coronary artery disease   . Depression   . High cholesterol   . History of bronchitis    "used to get it alot" (08/25/2012)  . Hypertension   . Panic attacks   . PTSD (post-traumatic stress disorder)    "have been treated in the past" (08/25/2012)    Patient Active Problem List   Diagnosis Date Noted  . ETOH abuse 12/09/2015  . Left knee pain 12/02/2015  . Coronary artery disease involving coronary bypass graft of native heart without angina pectoris 08/07/2014  . Essential hypertension 08/07/2014  . Hyperlipidemia 08/07/2014    Past Surgical History:  Procedure Laterality Date  . CARDIAC CATHETERIZATION  01/2005  . CORONARY ARTERY BYPASS GRAFT  01/2005   CABG X3  . Shrapnel  1969   LLE; left lateral thumb (required grafting)  . VARICOSE VEIN SURGERY  1980's   LLE       Home Medications    Prior to Admission medications   Medication Sig Start Date End Date Taking? Authorizing Provider  acetaminophen (TYLENOL) 325 MG tablet Take 2 tablets (650 mg total) by mouth every 6 (six) hours as needed. 07/04/17   Derwood Kaplan, MD  amoxicillin-clavulanate (AUGMENTIN) 875-125 MG tablet Take 1 tablet by mouth 2 (two) times daily. 01/18/17   Bing Neighbors, FNP  aspirin 325 MG EC tablet Take 325 mg by mouth daily.    [provider]  benzonatate (TESSALON) 100 MG capsule Take 1-2 capsules  (100-200 mg total) by mouth 3 (three) times daily as needed for cough. 01/18/17   Bing Neighbors, FNP  cephALEXin (KEFLEX) 500 MG capsule Take 1 capsule (500 mg total) by mouth 4 (four) times daily. 08/09/16   Geoffery Lyons, MD  metoprolol succinate (TOPROL-XL) 25 MG 24 hr tablet TAKE ONE TABLET BY MOUTH ONCE DAILY 03/31/17   Lyn Records, MD  nitroGLYCERIN (NITROSTAT) 0.4 MG SL tablet Place 1 tablet (0.4 mg total) under the tongue every 5 (five) minutes as needed for chest pain. 12/09/15   Dyann Kief, PA-C  penicillin v potassium (VEETID) 500 MG tablet Take 1 tablet (500 mg total) by mouth 4 (four) times daily. 07/04/17 07/11/17  Derwood Kaplan, MD    Family History Family History  Problem Relation Age of Onset  . Diabetes Mother   . Heart disease Father   . Cancer Brother     Social History Social History  Substance Use Topics  . Smoking status: Former Smoker    Packs/day: 2.00    Years: 5.00  . Smokeless tobacco: Never Used     Comment: 08/25/2012 "quit smoking cigarettes 40 yr ago"  . Alcohol use Yes     Comment: social     Allergies   Codeine and Vicodin [hydrocodone-acetaminophen]  Review of Systems Review of Systems  Constitutional: Positive for activity change.  HENT: Negative for drooling, sore throat, trouble swallowing and voice change.   Respiratory: Negative for shortness of breath.   Cardiovascular: Negative for chest pain.  Allergic/Immunologic: Negative for immunocompromised state.     Physical Exam Updated Vital Signs BP (!) 165/73   Pulse 60   Temp 98 F (36.7 C) (Oral)   Resp 18   Ht 5\' 10"  (1.778 m)   Wt 104.3 kg (230 lb)   SpO2 96%   BMI 33.00 kg/m   Physical Exam  Constitutional: He is oriented to person, place, and time. He appears well-developed.  HENT:  Head: Atraumatic.  Teeth # 27 is eroded, and at the base of it there is gingival swelling and fluctuance. The swelling continues towards the buccal surface where there is some  induration. + TTP.  Neck: Neck supple.  Cardiovascular: Normal rate.   Pulmonary/Chest: Effort normal.  Neurological: He is alert and oriented to person, place, and time.  Skin: Skin is warm.  Nursing note and vitals reviewed.    ED Treatments / Results  Labs (all labs ordered are listed, but only abnormal results are displayed) Labs Reviewed - No data to display  EKG  EKG Interpretation None       Radiology No results found.  Procedures INFERIOR ALVEOLAR NERVE BLOCK, RIGHT SIDE .Marland KitchenIncision and Drainage Date/Time: 07/04/2017 8:19 AM Performed by: Derwood Kaplan Authorized by: Derwood Kaplan   Consent:    Consent obtained:  Written   Consent given by:  Patient   Risks discussed:  Bleeding, incomplete drainage, pain, infection and damage to other organs   Alternatives discussed:  No treatment, delayed treatment and referral Location:    Type:  Abscess Pre-procedure details:    Skin preparation:  Antiseptic wash Procedure type:    Complexity:  Simple Procedure details:    Needle aspiration: no     Incision types:  Stab incision   Incision depth:  Submucosal   Scalpel blade:  10   Wound management:  Irrigated with saline and probed and deloculated   Drainage:  Purulent   Drainage amount:  Scant   Wound treatment:  Wound left open Post-procedure details:    Patient tolerance of procedure:  Tolerated well, no immediate complications Comments:     Patient had minimal pus drainage   (including critical care time)  Medications Ordered in ED Medications  bupivacaine-epinephrine (MARCAINE W/ EPI) 0.5% -1:200000 injection 1.8 mL (1.8 mLs Infiltration Given 07/04/17 0841)  bupivacaine-epinephrine (MARCAINE W/ EPI) 0.5% -1:200000 injection 1.8 mL (1.8 mLs Infiltration Given 07/04/17 0931)  oxyCODONE-acetaminophen (PERCOCET/ROXICET) 5-325 MG per tablet 1 tablet (1 tablet Oral Given 07/04/17 0930)     Initial Impression / Assessment and Plan / ED Course  I have reviewed  the triage vital signs and the nursing notes.  Pertinent labs & imaging results that were available during my care of the patient were reviewed by me and considered in my medical decision making (see chart for details).  Clinical Course as of Jul 05 943  [facility] Jul 04, 2017  1610 The incision and drainage expressed small amount of pus only. I could appreciated the swelling still and the induration, thus I used an 18 gauge needle to see if I can express any pus from deeper space. Unfortunately there was no response from the deep space. Now my suspicion for dental infection with abscess has gotten a bit lower, and patient's nerved have gotten  worse. We decided to call ENT and get him a close f/u. Dr. Lazarus SalinesWolicki will see the patient, I appreciate there help. If ENT clears the patient, he will see a Dentist. Pt will be started on pen v k.  Strict ER return precautions have been discussed, and patient is agreeing with the plan and is comfortable with the workup done and the recommendations from the ER.  Also - pt reports percocet works fine for him, vicodin causes allergic reaction.   [AN]    Clinical Course User Index [AN] Derwood KaplanNanavati, Maziah Keeling, MD    Pt has a dental abscess that we will drain. Pt has no trismus, stridor or crepitus over the neck. No signs of ludwigs angina and pt is not toxic appearing.  Final Clinical Impressions(s) / ED Diagnoses   Final diagnoses:  Dental abscess  Infected dental caries    New Prescriptions New Prescriptions   ACETAMINOPHEN (TYLENOL) 325 MG TABLET    Take 2 tablets (650 mg total) by mouth every 6 (six) hours as needed.   PENICILLIN V POTASSIUM (VEETID) 500 MG TABLET    Take 1 tablet (500 mg total) by mouth 4 (four) times daily.     Derwood KaplanNanavati, Adit Riddles, MD 07/04/17 [phone]

## 2017-07-04 NOTE — Discharge Instructions (Signed)
Please see the ENT doctor tomorrow, 1:30 pm. Start the antibiotics.

## 2017-07-26 ENCOUNTER — Encounter (HOSPITAL_COMMUNITY): Payer: Self-pay | Admitting: *Deleted

## 2017-07-26 ENCOUNTER — Emergency Department (HOSPITAL_COMMUNITY)
Admission: EM | Admit: 2017-07-26 | Discharge: 2017-07-26 | Disposition: A | Discharge status: Home / Self Care | Payer: Medicare Other | Attending: Emergency Medicine | Admitting: Emergency Medicine

## 2017-07-26 ENCOUNTER — Emergency Department (HOSPITAL_COMMUNITY): Payer: Medicare Other

## 2017-07-26 DIAGNOSIS — I10 Essential (primary) hypertension: Secondary | ICD-10-CM | POA: Insufficient documentation

## 2017-07-26 DIAGNOSIS — S0990XA Unspecified injury of head, initial encounter: Secondary | ICD-10-CM | POA: Diagnosis not present

## 2017-07-26 DIAGNOSIS — R079 Chest pain, unspecified: Secondary | ICD-10-CM | POA: Diagnosis not present

## 2017-07-26 DIAGNOSIS — Z79899 Other long term (current) drug therapy: Secondary | ICD-10-CM | POA: Diagnosis not present

## 2017-07-26 DIAGNOSIS — R55 Syncope and collapse: Secondary | ICD-10-CM | POA: Diagnosis not present

## 2017-07-26 DIAGNOSIS — I2510 Atherosclerotic heart disease of native coronary artery without angina pectoris: Secondary | ICD-10-CM | POA: Diagnosis not present

## 2017-07-26 DIAGNOSIS — R0789 Other chest pain: Secondary | ICD-10-CM

## 2017-07-26 DIAGNOSIS — Z7982 Long term (current) use of aspirin: Secondary | ICD-10-CM | POA: Diagnosis not present

## 2017-07-26 DIAGNOSIS — K047 Periapical abscess without sinus: Secondary | ICD-10-CM | POA: Diagnosis not present

## 2017-07-26 DIAGNOSIS — Z87891 Personal history of nicotine dependence: Secondary | ICD-10-CM | POA: Diagnosis not present

## 2017-07-26 DIAGNOSIS — Z951 Presence of aortocoronary bypass graft: Secondary | ICD-10-CM | POA: Diagnosis not present

## 2017-07-26 LAB — CBC WITH DIFFERENTIAL/PLATELET
Basophils Absolute: 0 10*3/uL (ref 0.0–0.1)
Basophils Relative: 0 %
Eosinophils Absolute: 0 10*3/uL (ref 0.0–0.7)
Eosinophils Relative: 0 %
HCT: 41.1 % (ref 39.0–52.0)
Hemoglobin: 14.2 g/dL (ref 13.0–17.0)
Lymphocytes Relative: 26 %
Lymphs Abs: 2.1 10*3/uL (ref 0.7–4.0)
MCH: 30.7 pg (ref 26.0–34.0)
MCHC: 34.5 g/dL (ref 30.0–36.0)
MCV: 88.8 fL (ref 78.0–100.0)
Monocytes Absolute: 0.7 10*3/uL (ref 0.1–1.0)
Monocytes Relative: 8 %
Neutro Abs: 5.1 10*3/uL (ref 1.7–7.7)
Neutrophils Relative %: 66 %
Platelets: 122 10*3/uL — ABNORMAL LOW (ref 150–400)
RBC: 4.63 MIL/uL (ref 4.22–5.81)
RDW: 13.2 % (ref 11.5–15.5)
WBC: 7.9 10*3/uL (ref 4.0–10.5)

## 2017-07-26 LAB — BASIC METABOLIC PANEL
Anion gap: 12 (ref 5–15)
BUN: 10 mg/dL (ref 6–20)
CO2: 21 mmol/L — ABNORMAL LOW (ref 22–32)
Calcium: 9.1 mg/dL (ref 8.9–10.3)
Chloride: 104 mmol/L (ref 101–111)
Creatinine, Ser: 0.77 mg/dL (ref 0.61–1.24)
GFR calc Af Amer: >60 mL/min (ref >60)
GFR calc non Af Amer: >60 mL/min (ref >60)
Glucose, Bld: 115 mg/dL — ABNORMAL HIGH (ref 65–99)
Potassium: 3.7 mmol/L (ref 3.5–5.1)
Sodium: 137 mmol/L (ref 135–145)

## 2017-07-26 LAB — TROPONIN I: Troponin I: <0.03 ng/mL (ref <0.03)

## 2017-07-26 MED ORDER — PENICILLIN V POTASSIUM 500 MG PO TABS: 500.0000 mg | ORAL_TABLET | Freq: Four times a day (QID) | ORAL | 0 refills | Status: AC

## 2017-07-26 MED ORDER — OXYCODONE-ACETAMINOPHEN 5-325 MG PO TABS
1.0000 | ORAL_TABLET | Freq: Once | ORAL | Status: AC
  Administered 2017-07-26: 1 via ORAL
  Filled 2017-07-26: qty 1

## 2017-07-26 MED ORDER — ACETAMINOPHEN 325 MG PO TABS: 650.0000 mg | ORAL_TABLET | Freq: Four times a day (QID) | ORAL | 0 refills | Status: DC | PRN

## 2017-07-26 MED ORDER — BACITRACIN ZINC 500 UNIT/GM EX OINT
TOPICAL_OINTMENT | CUTANEOUS | Status: AC
  Administered 2017-07-26: 06:00:00
  Filled 2017-07-26: qty 0.9
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
## 2018-02-06 ENCOUNTER — Telehealth: Payer: Self-pay | Admitting: Interventional Cardiology

## 2018-02-06 NOTE — Progress Notes (Signed)
Cardiology Office Note    Date:  02/07/2018   ID:  Ronald SciaraJoseph M [name], DOB 28-Jan-1946, MRN [id number]  PCP:  No primary care provider on file.  Cardiologist: Lesleigh NoeHenry W Ziair Penson III, MD   Chief Complaint  Patient presents with  . Coronary Artery Disease    History of Present Illness:  [name] is a 72 y.o. male with a history of CAD s/p CABG x3, with early graft failure and re-do surgery (2006), HTN, HLD, PTSD, and obesity who presents today with chest pain.     Ronald LongsJoseph has had spotty follow-up.  Management of risk factors has been poor especially as it pertains to blood pressure and lipids.  He has no cardiac complaints.  He does not have a primary care physician.  He denies exertional leg discomfort and edema.   Past Medical History:  Diagnosis Date  . Anginal pain (HCC) 01/2005  . Anxiety   . Arthritis    "knees"  . Chronic lower back pain   . Coronary artery disease   . Depression   . High cholesterol   . History of bronchitis    "used to get it alot" (08/25/2012)  . Hypertension   . Panic attacks   . PTSD (post-traumatic stress disorder)    "have been treated in the past" (08/25/2012)    Past Surgical History:  Procedure Laterality Date  . CARDIAC CATHETERIZATION  01/2005  . CORONARY ARTERY BYPASS GRAFT  01/2005   CABG X3  . Shrapnel  1969   LLE; left lateral thumb (required grafting)  . VARICOSE VEIN SURGERY  1980's   LLE    Current Medications: Outpatient Medications Prior to Visit  Medication Sig Dispense Refill  . metoprolol succinate (TOPROL-XL) 25 MG 24 hr tablet TAKE ONE TABLET BY MOUTH ONCE DAILY 30 tablet 7  . nitroGLYCERIN (NITROSTAT) 0.4 MG SL tablet Place 1 tablet (0.4 mg total) under the tongue every 5 (five) minutes as needed for chest pain. 25 tablet 3  . acetaminophen (TYLENOL) 325 MG tablet Take 2 tablets (650 mg total) by mouth every 6 (six) hours as needed. 30 tablet 0  . amoxicillin-clavulanate (AUGMENTIN) 875-125 MG tablet Take 1 tablet by  mouth 2 (two) times daily. (Patient not taking: Reported on 07/26/2017) 20 tablet 0  . aspirin 325 MG EC tablet Take 325 mg by mouth daily.    . benzonatate (TESSALON) 100 MG capsule Take 1-2 capsules (100-200 mg total) by mouth 3 (three) times daily as needed for cough. (Patient not taking: Reported on 07/26/2017) 40 capsule 0   No facility-administered medications prior to visit.      Allergies:   Codeine and Vicodin [hydrocodone-acetaminophen]   Social History   Socioeconomic History  . Marital status: Married    Spouse name: None  . Number of children: None  . Years of education: None  . Highest education level: None  Social Needs  . Financial resource strain: None  . Food insecurity - worry: None  . Food insecurity - inability: None  . Transportation needs - medical: None  . Transportation needs - non-medical: None  Occupational History  . None  Tobacco Use  . Smoking status: Former Smoker    Packs/day: 2.00    Years: 5.00    Pack years: 10.00  . Smokeless tobacco: Never Used  . Tobacco comment: 08/25/2012 "quit smoking cigarettes 40 yr ago"  Substance and Sexual Activity  . Alcohol use: Yes    Comment: social  . Drug  use: No    Comment: 08/25/2012 "last marijuana 10 years ago"  . Sexual activity: Yes  Other Topics Concern  . None  Social History Narrative  . None     Family History:  The patient's family history includes Cancer in his brother; Diabetes in his mother; Heart disease in his father.   ROS:   Please see the history of present illness.    None with the exception of continued difficulty with anxiety and behavioral health. All other systems reviewed and are negative.   PHYSICAL EXAM:   VS:  BP (!) 166/90   Pulse 63   Ht 5\' 11"  (1.803 m)   Wt 238 lb 6.4 oz (108.1 kg)   BMI 33.25 kg/m    GEN: Well nourished, well developed, in no acute distress  HEENT: normal  Neck: no JVD, carotid bruits, or masses Cardiac: RRR; no murmurs, rubs, or gallops,no  edema  Respiratory:  clear to auscultation bilaterally, normal work of breathing GI: soft, nontender, nondistended, + BS MS: no deformity or atrophy  Skin: warm and dry, no rash Neuro:  Alert and Oriented x 3, Strength and sensation are intact Psych: euthymic mood, full affect  Wt Readings from Last 3 Encounters:  02/07/18 238 lb 6.4 oz (108.1 kg)  07/04/17 230 lb (104.3 kg)  01/18/17 243 lb (110.2 kg)      Studies/Labs Reviewed:   EKG:  EKG normal sinus rhythm with nonspecific T wave and ST abnormality.  Poor R wave progression V1 to V2.  When compared to prior, poor R wave progression is new.  Recent Labs: 07/26/2017: BUN 10; Creatinine, Ser 0.77; Hemoglobin 14.2; Platelets 122; Potassium 3.7; Sodium 137   Lipid Panel    Component Value Date/Time   CHOL 251 (H) 08/26/2012 0500   TRIG 282 (H) 08/26/2012 0500   HDL 36 (L) 08/26/2012 0500   CHOLHDL 7.0 08/26/2012 0500   VLDL 56 (H) 08/26/2012 0500   LDLCALC 159 (H) 08/26/2012 0500    Additional studies/ records that were reviewed today include:  None    ASSESSMENT:    1. Coronary artery disease involving coronary bypass graft of native heart without angina pectoris   2. Essential hypertension   3. Other hyperlipidemia      PLAN:  In order of problems listed above:  1. He seems to be doing quite well from a ischemic standpoint.  Risk factor modification is been a difficult task.  Despite recommending various agents to mitigate risk, he stopped taking the medications in hope of being able to treat his problem without pills. 2. BP target should be 130/80 mmHg.  In addition to low-dose metoprolol succinate will add HCTZ 12.5 mg/day.  Best approach in him would be a combination ACE/diuretic along with beta-blocker therapy.  He will come back in approximately a month for blood pressure recheck.  Lab work in 1 week. 3. Lipids will also be checked at the time of next blood draw.  He has refused to take statins in the  past.  Encouraged weight loss, exercise, salt restriction, and compliance with medical therapy.  Follow-up in 1 year or earlier depending upon clinical course.  Medication Adjustments/Labs and Tests Ordered: Current medicines are reviewed at length with the patient today.  Concerns regarding medicines are outlined above.  Medication changes, Labs and Tests ordered today are listed in the Patient Instructions below. There are no Patient Instructions on file for this visit.   Signed, Lesleigh Noe, MD  02/07/2018  4:41 PM    [facility] [address] [place], [place], [place]  [address] Phone: [phone]; Fax: [phone]

## 2018-02-06 NOTE — Telephone Encounter (Signed)
New Message     Pt c/o BP issue: STAT if pt c/o blurred vision, one-sided weakness or slurred speech  1. What are your last 5 BP readings? Patient bp was 180/90    2. Are you having any other symptoms (ex. Dizziness, headache, blurred vision, passed out)? Blurred vision this morning when it was high  3. What is your BP issue? He had to take 2  metoprolol succinate (TOPROL-XL) 25 MG 24 hr tablet TAKE ONE TABLET BY MOUTH ONCE DAILY    this morning for it to come back down , it is 148/85

## 2018-02-06 NOTE — Telephone Encounter (Signed)
BP this morning was 180/90.  Pt states that he did have blurred vision when he woke up but this is not unusual for him d/t cataracts.  Took Metoprolol 25mg  at 6AM and when he realized BP was high he took another one around 8:30AM.  Pt had slight dizziness this morning but has since resolved.  Pt rechecked BP while on the phone with the operator and it was 148/85.  Pt states BP has been elevated off and on but feels likely d/t diet.  Pt ate a [employer] last night and admits to eating foods he shouldn't have.  Pt overdue for one year follow up.  Scheduled pt to see Dr. Katrinka BlazingSmith tomorrow.  Advised monitor BP today and if it gets high again to contact the office.  Pt verbalized understanding and was in agreement with this plan.

## 2018-02-07 ENCOUNTER — Encounter: Payer: Self-pay | Admitting: Interventional Cardiology

## 2018-02-07 ENCOUNTER — Ambulatory Visit (INDEPENDENT_AMBULATORY_CARE_PROVIDER_SITE_OTHER): Payer: Medicare Other | Admitting: Interventional Cardiology

## 2018-02-07 ENCOUNTER — Encounter: Payer: Self-pay | Admitting: *Deleted

## 2018-02-07 VITALS — BP 166/90 | HR 63 | Ht 71.0 in | Wt 238.4 lb

## 2018-02-07 DIAGNOSIS — I10 Essential (primary) hypertension: Secondary | ICD-10-CM

## 2018-02-07 DIAGNOSIS — I25810 Atherosclerosis of coronary artery bypass graft(s) without angina pectoris: Secondary | ICD-10-CM

## 2018-02-07 DIAGNOSIS — E7849 Other hyperlipidemia: Secondary | ICD-10-CM

## 2018-02-07 MED ORDER — HYDROCHLOROTHIAZIDE 12.5 MG PO CAPS: 12.5000 mg | ORAL_CAPSULE | Freq: Every day | ORAL | 3 refills | Status: DC

## 2018-02-07 NOTE — Patient Instructions (Signed)
Medication Instructions:  1) START Hydrochlorothiazide 12.5mg  once daily  Labwork: Your physician recommends that you return for lab work in: 2 weeks (CMET and Lipids).  Make sure you are fasting for these labs.  Nothing to eat of drink after midnight the night before.   Testing/Procedures: None  Follow-Up: Your physician recommends that you schedule a follow-up appointment in: 1 month with the [facility].  Your physician wants you to follow-up in: 1 year with Dr. Katrinka BlazingSmith. You will receive a reminder letter in the mail two months in advance. If you don't receive a letter, please call our office to schedule the follow-up appointment.    Any Other Special Instructions Will Be Listed Below (If Applicable).     If you need a refill on your cardiac medications before your next appointment, please call your pharmacy.

## 2018-02-21 ENCOUNTER — Other Ambulatory Visit: Payer: Medicare Other

## 2018-03-13 NOTE — Progress Notes (Deleted)
Patient ID: [name] Downs                 DOB: Dec 30, 1945                      MRN: [id number]     HPI: [name] Floyd is a 72 y.o. male patient of Dr. Katrinka BlazingSmith who presents today for hypertension evaluation. PMH significant for CAD s/p CABG x3, with early graft failure and re-do surgery (2006), HTN, HLD, PTSD, and obesity. At his last visit with Dr. Katrinka BlazingSmith he was started on HCTZ with his metoprolol. It was recommended to consider ACEi/ diuretic and betablocker. Of note he previously has stopped medications in hopes of controlling pressures naturally.   He presents today     Current HTN meds:  HCTZ 12.5mg  daily Metoprolol succinate 25mg  daily   Previously tried: lisinopril (  BP goal: <130/80  Family History: Cancer in his brother; Diabetes in his mother; Heart disease in his father.  Social History: former smoker, social alcohol   Diet:   Exercise:   Home BP readings:   Wt Readings from Last 3 Encounters:  02/07/18 238 lb 6.4 oz (108.1 kg)  07/04/17 230 lb (104.3 kg)  01/18/17 243 lb (110.2 kg)   BP Readings from Last 3 Encounters:  02/07/18 (!) 166/90  07/26/17 (!) 171/79  07/04/17 (!) 165/73   Pulse Readings from Last 3 Encounters:  02/07/18 63  07/26/17 68  07/04/17 60    Renal function: CrCl cannot be calculated (Patient's most recent lab result is older than the maximum 21 days allowed.).  Past Medical History:  Diagnosis Date  . Anginal pain (HCC) 01/2005  . Anxiety   . Arthritis    "knees"  . Chronic lower back pain   . Coronary artery disease   . Depression   . High cholesterol   . History of bronchitis    "used to get it alot" (08/25/2012)  . Hypertension   . Panic attacks   . PTSD (post-traumatic stress disorder)    "have been treated in the past" (08/25/2012)    Current Outpatient Medications on File Prior to Visit  Medication Sig Dispense Refill  . hydrochlorothiazide (MICROZIDE) 12.5 MG capsule Take 1 capsule (12.5 mg total) by mouth daily.  90 capsule 3  . metoprolol succinate (TOPROL-XL) 25 MG 24 hr tablet TAKE ONE TABLET BY MOUTH ONCE DAILY 30 tablet 7  . nitroGLYCERIN (NITROSTAT) 0.4 MG SL tablet Place 1 tablet (0.4 mg total) under the tongue every 5 (five) minutes as needed for chest pain. 25 tablet 3   No current facility-administered medications on file prior to visit.     Allergies  Allergen Reactions  . Codeine Palpitations  . Vicodin [Hydrocodone-Acetaminophen] Nausea And Vomiting, Swelling and Palpitations    There were no vitals taken for this visit.   Assessment/Plan: Hypertension:    Thank you, Freddie ApleyKelley [name]. Cleatis PolkaAuten, PharmD  [facility]  03/13/2018 9:43 PM

## 2018-03-14 ENCOUNTER — Ambulatory Visit: Payer: Medicare Other

## 2018-04-25 ENCOUNTER — Other Ambulatory Visit: Payer: Self-pay | Admitting: Interventional Cardiology

## 2018-04-25 ENCOUNTER — Other Ambulatory Visit: Payer: Self-pay | Admitting: Cardiology

## 2018-04-25 ENCOUNTER — Telehealth: Payer: Self-pay | Admitting: Cardiology

## 2018-04-25 MED ORDER — METOPROLOL SUCCINATE ER 25 MG PO TB24: 25.0000 mg | ORAL_TABLET | Freq: Every day | ORAL | 4 refills | Status: DC

## 2018-04-25 NOTE — Telephone Encounter (Signed)
Received a call regarding medication refill for Metoprolol XL-25mg . Will call in refill for pt to [facility] pharmacy on file.  Georgie Chard NP-C [facility] Pager: [phone]

## 2018-04-26 NOTE — Telephone Encounter (Signed)
Outpatient Medication Detail    Disp Refills Start End   metoprolol succinate (TOPROL-XL) 25 MG 24 hr tablet 90 tablet 4 04/25/2018    Sig - Route: Take 1 tablet (25 mg total) by mouth daily. - Oral   Sent to pharmacy as: metoprolol succinate (TOPROL-XL) 25 MG 24 hr tablet   E-Prescribing Status: Receipt confirmed by pharmacy (04/25/2018 6:50 PM EDT)   Pharmacy   [facility] - [place], [place] - [address]

## 2018-06-07 ENCOUNTER — Telehealth: Payer: Self-pay | Admitting: *Deleted

## 2018-06-07 ENCOUNTER — Encounter: Payer: Self-pay | Admitting: *Deleted
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
## 2018-06-07 NOTE — Telephone Encounter (Signed)
-----   Message from Marcelle SmilingKimberly D Mesiemore sent at 06/07/2018 12:10 PM EDT ----- Contact: [phone] Merrily PewHi Jennifer,   Patient called in asking for a letter to be wrote stating he has had triple bypass surgery. He needs to give this to his lawyer. I made him aware I would send you a message asking if this can be done. Please call him if needed.  Thanks, Selena BattenKim

## 2018-06-07 NOTE — Telephone Encounter (Signed)
Spoke with pt and made him aware that letter was ready.  Pt preferred to pick up.  Advised I would place at the front desk.  Pt appreciative for call.

## 2018-06-07 NOTE — Telephone Encounter (Signed)
To whom it may concern,  Babs SciaraJoseph M Babel, date of birth 1946/09/14, underwent coronary artery bypass surgery in 2006.  He has been under chronic cardiac care since that time.  If we may provide further information please do not hesitate to contact us.    Respectfully,    Darci NeedleHenry W.  B.  Sybella Harnish, III, MD

## 2018-08-01 ENCOUNTER — Emergency Department (HOSPITAL_COMMUNITY): Payer: Medicare Other

## 2018-08-01 ENCOUNTER — Encounter (HOSPITAL_COMMUNITY): Payer: Self-pay | Admitting: Emergency Medicine

## 2018-08-01 ENCOUNTER — Other Ambulatory Visit: Payer: Self-pay

## 2018-08-01 ENCOUNTER — Emergency Department (HOSPITAL_COMMUNITY)
Admission: EM | Admit: 2018-08-01 | Discharge: 2018-08-02 | Disposition: A | Discharge status: Home / Self Care | Payer: Medicare Other | Attending: Emergency Medicine | Admitting: Emergency Medicine

## 2018-08-01 DIAGNOSIS — Z7982 Long term (current) use of aspirin: Secondary | ICD-10-CM | POA: Diagnosis not present

## 2018-08-01 DIAGNOSIS — Z79899 Other long term (current) drug therapy: Secondary | ICD-10-CM | POA: Insufficient documentation

## 2018-08-01 DIAGNOSIS — R040 Epistaxis: Secondary | ICD-10-CM | POA: Insufficient documentation

## 2018-08-01 DIAGNOSIS — I10 Essential (primary) hypertension: Secondary | ICD-10-CM | POA: Insufficient documentation

## 2018-08-01 DIAGNOSIS — Z87891 Personal history of nicotine dependence: Secondary | ICD-10-CM | POA: Diagnosis not present

## 2018-08-01 DIAGNOSIS — R51 Headache: Secondary | ICD-10-CM | POA: Diagnosis not present

## 2018-08-01 DIAGNOSIS — Z951 Presence of aortocoronary bypass graft: Secondary | ICD-10-CM | POA: Diagnosis not present

## 2018-08-01 DIAGNOSIS — R079 Chest pain, unspecified: Secondary | ICD-10-CM | POA: Diagnosis not present

## 2018-08-01 LAB — CBC
HCT: 45.4 % (ref 39.0–52.0)
Hemoglobin: 14.9 g/dL (ref 13.0–17.0)
MCH: 30.7 pg (ref 26.0–34.0)
MCHC: 32.8 g/dL (ref 30.0–36.0)
MCV: 93.4 fL (ref 78.0–100.0)
PLATELETS: 129 10*3/uL — AB (ref 150–400)
RBC: 4.86 MIL/uL (ref 4.22–5.81)
RDW: 13.2 % (ref 11.5–15.5)
WBC: 6.7 10*3/uL (ref 4.0–10.5)

## 2018-08-01 LAB — BASIC METABOLIC PANEL
ANION GAP: 11 (ref 5–15)
BUN: 19 mg/dL (ref 8–23)
CALCIUM: 9.6 mg/dL (ref 8.9–10.3)
CO2: 24 mmol/L (ref 22–32)
Chloride: 101 mmol/L (ref 98–111)
Creatinine, Ser: 1.02 mg/dL (ref 0.61–1.24)
GFR calc Af Amer: >60 mL/min (ref >60)
GLUCOSE: 106 mg/dL — AB (ref 70–99)
POTASSIUM: 4.1 mmol/L (ref 3.5–5.1)
SODIUM: 136 mmol/L (ref 135–145)

## 2018-08-01 MED ORDER — OXYMETAZOLINE HCL 0.05 % NA SOLN
1.0000 | Freq: Once | NASAL | Status: DC
  Filled 2018-08-01: qty 15

## 2018-08-01 NOTE — ED Triage Notes (Addendum)
Patient to ED c/o BP being high today despite taking his metoprolol twice as prescribed. He states his nose started bleeding earlier today and hasn't stopped - currently has cotton in place (R nostril - has cotton in place - appears to be controlled at this time). Denies headache or new vision changes.

## 2018-08-01 NOTE — ED Notes (Signed)
Pt. Called for vitals recheck and xray. Pt. In [place]

## 2018-08-01 NOTE — ED Provider Notes (Signed)
Patient placed in Quick Look pathway, seen and evaluated   Chief Complaint: Epistaxis, hypertension  HPI:   Patient is a 72 year old male with a history of CAD s/p CABG, hypertension, hyperlipidemia, EtOH abuse who presents emergency department today for evaluation of epistaxis that began earlier today.  He states that he has had nosebleeds in the past when he has had elevated blood pressure.  Upon arrival to the ED patient was noted to be hypertensive.  He states that he took his metoprolol today.  Denies any recent head trauma.  He is not on blood thinners.  Patient voices concerned about his blood pressure.  He states that upon arriving to the ED he developed left-sided chest pain radiating to the left arm as well as a headache and dizziness.  Is also stating that he has some blurred vision.  ROS: epistaxis, HTN (one)  Physical Exam:   Gen: No distress  Neuro: Awake and Alert  Skin: Warm    Focused Exam: RRR, no murmurs. CTAB. Cranial nerves II-XII intact.  5/5 strength to bilateral upper and lower extremities.  Negative pronator drift.  Negative finger-to-nose bilaterally.  Normal sensation to bilateral upper and lower extremities.   Initiation of care has begun. The patient has been counseled on the process, plan, and necessity for staying for the completion/evaluation, and the remainder of the medical screening examination  Pt advised to inform nursing staff immediately if they experience any new or worsening of symptoms while waiting in the waiting room.    Rayne Du 08/01/18 1918    Gerhard Munch, MD 08/02/18 Moses Manners

## 2018-08-02 LAB — I-STAT TROPONIN, ED: TROPONIN I, POC: 0.02 ng/mL (ref 0.00–0.08)

## 2018-08-02 NOTE — ED Provider Notes (Signed)
[facility] EMERGENCY DEPARTMENT Provider Note   CSN: [id number] Arrival date & time: 08/01/18  1707     History   Chief Complaint Chief Complaint  Patient presents with  . Hypertension  . Epistaxis    HPI [name] is a 72 y.o. male.  HPI  This is a 72 year old male who presents with high blood pressure and epistaxis.  Patient reports "earlier today I felt like my blood pressure was up."  He was noted to be 190 systolic in triage.  He noted nosebleeding from mostly the right naris.  Has had nosebleeds in the past in the setting of hypertension.  He states he had a self-limited episode of left-sided chest pain that was nonradiating.  He also had a mild headache.  These have since resolved.  Currently he states he is asymptomatic.  He used a cottonball to stop his nosebleed.  Denies any recent fevers, cough, shortness of breath, nausea, vomiting, abdominal pain.  Denies any difficulty speaking, weakness, numbness, strokelike symptoms.  Past Medical History:  Diagnosis Date  . Anginal pain (HCC) 01/2005  . Anxiety   . Arthritis    "knees"  . Chronic lower back pain   . Coronary artery disease   . Depression   . High cholesterol   . History of bronchitis    "used to get it alot" (08/25/2012)  . Hypertension   . Panic attacks   . PTSD (post-traumatic stress disorder)    "have been treated in the past" (08/25/2012)    Patient Active Problem List   Diagnosis Date Noted  . ETOH abuse 12/09/2015  . Left knee pain 12/02/2015  . Coronary artery disease involving coronary bypass graft of native heart without angina pectoris 08/07/2014  . Essential hypertension 08/07/2014  . Hyperlipidemia 08/07/2014    Past Surgical History:  Procedure Laterality Date  . CARDIAC CATHETERIZATION  01/2005  . CORONARY ARTERY BYPASS GRAFT  01/2005   CABG X3  . Shrapnel  1969   LLE; left lateral thumb (required grafting)  . VARICOSE VEIN SURGERY  1980's   LLE         Home Medications    Prior to Admission medications   Medication Sig Start Date End Date Taking? Authorizing Provider  aspirin EC 81 MG tablet Take 81 mg by mouth daily.   Yes [provider]  metoprolol succinate (TOPROL-XL) 25 MG 24 hr tablet Take 1 tablet (25 mg total) by mouth daily. 04/25/18  Yes Georgie Chard D, NP  nitroGLYCERIN (NITROSTAT) 0.4 MG SL tablet Place 1 tablet (0.4 mg total) under the tongue every 5 (five) minutes as needed for chest pain. 12/09/15  Yes Dyann Kief, PA-C  hydrochlorothiazide (MICROZIDE) 12.5 MG capsule Take 1 capsule (12.5 mg total) by mouth daily. Patient not taking: Reported on 08/02/2018 02/07/18 02/02/19  Lyn Records, MD    Family History Family History  Problem Relation Age of Onset  . Diabetes Mother   . Heart disease Father   . Cancer Brother     Social History Social History   Tobacco Use  . Smoking status: Former Smoker    Packs/day: 2.00    Years: 5.00    Pack years: 10.00  . Smokeless tobacco: Never Used  . Tobacco comment: 08/25/2012 "quit smoking cigarettes 40 yr ago"  Substance Use Topics  . Alcohol use: Yes    Comment: social  . Drug use: No    Comment: 08/25/2012 "last marijuana 10 years ago"  Allergies   Codeine and Vicodin [hydrocodone-acetaminophen]   Review of Systems Review of Systems  Constitutional: Negative for fever.  HENT: Positive for nosebleeds.   Respiratory: Negative for shortness of breath.   Cardiovascular: Positive for chest pain. Negative for leg swelling.  Gastrointestinal: Negative for abdominal pain, nausea and vomiting.  Genitourinary: Negative for dysuria.  Neurological: Positive for headaches. Negative for dizziness and weakness.  All other systems reviewed and are negative.    Physical Exam Updated Vital Signs BP (!) 172/77 (BP Location: Right Arm)   Pulse (!) 52   Temp 97.7 F (36.5 C) (Oral)   Resp 17   SpO2 97%   Physical Exam  Constitutional: He is  oriented to person, place, and time. He appears well-developed and well-nourished. No distress.  HENT:  Head: Normocephalic and atraumatic.  Dried blood and clot noted in the right naris, no active bleeding noted, clots evacuated, no significant bleeding or friable tissue noted  Eyes: Pupils are equal, round, and reactive to light.  Neck: Neck supple.  Cardiovascular: Normal rate, regular rhythm and normal heart sounds.  No murmur heard. Well-healing midline sternotomy scar  Pulmonary/Chest: Effort normal and breath sounds normal. No respiratory distress. He has no wheezes.  Abdominal: Soft. Bowel sounds are normal. There is no tenderness. There is no rebound.  Musculoskeletal: He exhibits no edema.  Neurological: He is alert and oriented to person, place, and time.  Skin: Skin is warm and dry.  Psychiatric: He has a normal mood and affect.  Nursing note and vitals reviewed.    ED Treatments / Results  Labs (all labs ordered are listed, but only abnormal results are displayed) Labs Reviewed  CBC - Abnormal; Notable for the following components:      Result Value   Platelets 129 (*)    All other components within normal limits  BASIC METABOLIC PANEL - Abnormal; Notable for the following components:   Glucose, Bld 106 (*)    All other components within normal limits  I-STAT TROPONIN, ED    EKG EKG Interpretation  Date/Time:  Tuesday August 01 2018 23:51:04 EDT Ventricular Rate:  57 PR Interval:    QRS Duration: 120 QT Interval:  467 QTC Calculation: 455 R Axis:   61 Text Interpretation:  Sinus rhythm Prolonged PR interval Nonspecific intraventricular conduction delay Nonspecific T abnrm, anterolateral leads Baseline wander in lead(s) V2 No significant change since last tracing Confirmed by Ross Marcus ([id number]) on 08/02/2018 12:00:17 AM   Radiology Dg Chest 2 View  Result Date: 08/01/2018 CLINICAL DATA:  Chest pain EXAM: CHEST - 2 VIEW COMPARISON:  05/26/2017  FINDINGS: Post sternotomy changes. No acute consolidation or effusion. Borderline cardiomegaly. Aortic atherosclerosis. No pneumothorax. IMPRESSION: No active cardiopulmonary disease. Electronically Signed   By: Jasmine Pang M.D.   On: 08/01/2018 20:24   Ct Head Wo Contrast  Result Date: 08/01/2018 CLINICAL DATA:  Hypertension with headache. EXAM: CT HEAD WITHOUT CONTRAST TECHNIQUE: Contiguous axial images were obtained from the base of the skull through the vertex without intravenous contrast. COMPARISON:  CT head dated July 26, 2017. FINDINGS: Brain: No evidence of acute infarction, hemorrhage, hydrocephalus, extra-axial collection or mass lesion/mass effect. Stable mild generalized cerebral atrophy. Vascular: Calcified atherosclerosis at the skullbase. No hyperdense vessel. Skull: Normal. Negative for fracture or focal lesion. Sinuses/Orbits: No acute finding. Other: None. IMPRESSION: 1.  No acute intracranial abnormality. Electronically Signed   By: Obie Dredge M.D.   On: 08/01/2018 20:20    Procedures Procedures (including  critical care time)  Medications Ordered in ED Medications  oxymetazoline (AFRIN) 0.05 % nasal spray 1 spray (0 sprays Each Nare Hold 08/01/18 1723)     Initial Impression / Assessment and Plan / ED Course  I have reviewed the triage vital signs and the nursing notes.  Pertinent labs & imaging results that were available during my care of the patient were reviewed by me and considered in my medical decision making (see chart for details).     Patient presents with concerns for high blood pressure and nosebleed.  He is overall nontoxic-appearing.  Vital signs are largely reassuring.  Blood pressures trending downward to 172/77.  Patient states he feels much better.  Nosebleeds appears to have stopped.  No active bleeding on exam.  No significant friable tissue.  Clots were evacuated and Afrin was used.  No recurrent bleed.  I have reviewed his work-up from triage.   He had a head CT and chest pain work-up given his complaints of these in triage.  These are reassuring.  EKG without ischemia and chest x-ray without pneumothorax or pneumonia.  He is currently without any complaint.  Doubt hypertensive urgency or emergency at this time.  Follow-up closely with primary doctor for blood pressure recheck and medication titration.  He was additionally given ENT follow-up.  After history, exam, and medical workup I feel the patient has been appropriately medically screened and is safe for discharge home. Pertinent diagnoses were discussed with the patient. Patient was given return precautions.   Final Clinical Impressions(s) / ED Diagnoses   Final diagnoses:  Essential hypertension  Epistaxis    ED Discharge Orders    None       Horton, Mayer Masker, MD 08/02/18 [phone]

## 2018-08-02 NOTE — Discharge Instructions (Addendum)
Were seen today for high blood pressure and nosebleeds.  Your work-up is largely reassuring.  Follow-up closely with your primary doctor for blood pressure recheck.  Continue to take her blood pressure occasions as prescribed.  If you develop headache, chest pain, any new or worsening symptoms you need to be reevaluated immediately.  Additionally, if nosebleeds redevelop, make sure to blow out all the clots and use Afrin.  ENT follow-up provided.

## 2018-08-15 DIAGNOSIS — J018 Other acute sinusitis: Secondary | ICD-10-CM | POA: Diagnosis not present

## 2018-08-15 DIAGNOSIS — R040 Epistaxis: Secondary | ICD-10-CM | POA: Diagnosis not present

## 2018-12-11 ENCOUNTER — Telehealth: Payer: Self-pay | Admitting: Interventional Cardiology

## 2018-12-11 ENCOUNTER — Emergency Department (HOSPITAL_COMMUNITY): Payer: Medicare Other

## 2018-12-11 ENCOUNTER — Other Ambulatory Visit: Payer: Self-pay

## 2018-12-11 ENCOUNTER — Inpatient Hospital Stay (HOSPITAL_COMMUNITY)
Admission: EM | Admit: 2018-12-11 | Discharge: 2018-12-13 | DRG: 897 | Disposition: A | Discharge status: Home / Self Care | Payer: Medicare Other | Attending: Internal Medicine | Admitting: Internal Medicine

## 2018-12-11 ENCOUNTER — Encounter (HOSPITAL_COMMUNITY): Payer: Self-pay | Admitting: *Deleted

## 2018-12-11 DIAGNOSIS — F10939 Alcohol use, unspecified with withdrawal, unspecified: Secondary | ICD-10-CM | POA: Diagnosis present

## 2018-12-11 DIAGNOSIS — E785 Hyperlipidemia, unspecified: Secondary | ICD-10-CM | POA: Diagnosis present

## 2018-12-11 DIAGNOSIS — I160 Hypertensive urgency: Secondary | ICD-10-CM | POA: Diagnosis not present

## 2018-12-11 DIAGNOSIS — I10 Essential (primary) hypertension: Secondary | ICD-10-CM | POA: Diagnosis not present

## 2018-12-11 DIAGNOSIS — F4310 Post-traumatic stress disorder, unspecified: Secondary | ICD-10-CM | POA: Diagnosis present

## 2018-12-11 DIAGNOSIS — I209 Angina pectoris, unspecified: Secondary | ICD-10-CM | POA: Diagnosis present

## 2018-12-11 DIAGNOSIS — F419 Anxiety disorder, unspecified: Secondary | ICD-10-CM | POA: Diagnosis present

## 2018-12-11 DIAGNOSIS — K7460 Unspecified cirrhosis of liver: Secondary | ICD-10-CM | POA: Diagnosis present

## 2018-12-11 DIAGNOSIS — Z91128 Patient's intentional underdosing of medication regimen for other reason: Secondary | ICD-10-CM

## 2018-12-11 DIAGNOSIS — G8929 Other chronic pain: Secondary | ICD-10-CM | POA: Diagnosis present

## 2018-12-11 DIAGNOSIS — Z951 Presence of aortocoronary bypass graft: Secondary | ICD-10-CM

## 2018-12-11 DIAGNOSIS — F1010 Alcohol abuse, uncomplicated: Secondary | ICD-10-CM | POA: Diagnosis present

## 2018-12-11 DIAGNOSIS — Z7982 Long term (current) use of aspirin: Secondary | ICD-10-CM

## 2018-12-11 DIAGNOSIS — I25810 Atherosclerosis of coronary artery bypass graft(s) without angina pectoris: Secondary | ICD-10-CM | POA: Diagnosis present

## 2018-12-11 DIAGNOSIS — F10239 Alcohol dependence with withdrawal, unspecified: Secondary | ICD-10-CM | POA: Diagnosis present

## 2018-12-11 DIAGNOSIS — R0789 Other chest pain: Secondary | ICD-10-CM | POA: Diagnosis present

## 2018-12-11 DIAGNOSIS — F410 Panic disorder [episodic paroxysmal anxiety] without agoraphobia: Secondary | ICD-10-CM | POA: Diagnosis present

## 2018-12-11 DIAGNOSIS — R079 Chest pain, unspecified: Secondary | ICD-10-CM

## 2018-12-11 DIAGNOSIS — Z8249 Family history of ischemic heart disease and other diseases of the circulatory system: Secondary | ICD-10-CM

## 2018-12-11 DIAGNOSIS — Z79899 Other long term (current) drug therapy: Secondary | ICD-10-CM

## 2018-12-11 DIAGNOSIS — F329 Major depressive disorder, single episode, unspecified: Secondary | ICD-10-CM | POA: Diagnosis present

## 2018-12-11 DIAGNOSIS — Z87891 Personal history of nicotine dependence: Secondary | ICD-10-CM

## 2018-12-11 DIAGNOSIS — Z885 Allergy status to narcotic agent status: Secondary | ICD-10-CM

## 2018-12-11 DIAGNOSIS — E7800 Pure hypercholesterolemia, unspecified: Secondary | ICD-10-CM | POA: Diagnosis present

## 2018-12-11 DIAGNOSIS — D6959 Other secondary thrombocytopenia: Secondary | ICD-10-CM | POA: Diagnosis present

## 2018-12-11 DIAGNOSIS — T465X6A Underdosing of other antihypertensive drugs, initial encounter: Secondary | ICD-10-CM | POA: Diagnosis present

## 2018-12-11 DIAGNOSIS — Z7141 Alcohol abuse counseling and surveillance of alcoholic: Secondary | ICD-10-CM

## 2018-12-11 HISTORY — DX: Inflammatory liver disease, unspecified: K75.9

## 2018-12-11 LAB — BASIC METABOLIC PANEL
Anion gap: 11 (ref 5–15)
BUN: 15 mg/dL (ref 8–23)
CO2: 22 mmol/L (ref 22–32)
Calcium: 9 mg/dL (ref 8.9–10.3)
Chloride: 101 mmol/L (ref 98–111)
Creatinine, Ser: 0.84 mg/dL (ref 0.61–1.24)
GFR calc Af Amer: >60 mL/min (ref >60)
GFR calc non Af Amer: >60 mL/min (ref >60)
Glucose, Bld: 119 mg/dL — ABNORMAL HIGH (ref 70–99)
Potassium: 4 mmol/L (ref 3.5–5.1)
Sodium: 134 mmol/L — ABNORMAL LOW (ref 135–145)

## 2018-12-11 LAB — CBC
HCT: 43.3 % (ref 39.0–52.0)
HCT: 42.4 % (ref 39.0–52.0)
Hemoglobin: 14.2 g/dL (ref 13.0–17.0)
Hemoglobin: 14 g/dL (ref 13.0–17.0)
MCH: 30.7 pg (ref 26.0–34.0)
MCH: 30.5 pg (ref 26.0–34.0)
MCHC: 32.8 g/dL (ref 30.0–36.0)
MCHC: 33 g/dL (ref 30.0–36.0)
MCV: 93.7 fL (ref 80.0–100.0)
MCV: 92.4 fL (ref 80.0–100.0)
Platelets: 105 10*3/uL — ABNORMAL LOW (ref 150–400)
Platelets: 107 10*3/uL — ABNORMAL LOW (ref 150–400)
RBC: 4.62 MIL/uL (ref 4.22–5.81)
RBC: 4.59 MIL/uL (ref 4.22–5.81)
RDW: 12.8 % (ref 11.5–15.5)
RDW: 12.9 % (ref 11.5–15.5)
WBC: 5.6 10*3/uL (ref 4.0–10.5)
WBC: 5.5 10*3/uL (ref 4.0–10.5)
nRBC: 0 % (ref 0.0–0.2)
nRBC: 0 % (ref 0.0–0.2)

## 2018-12-11 LAB — CREATININE, SERUM
Creatinine, Ser: 0.77 mg/dL (ref 0.61–1.24)
GFR calc Af Amer: >60 mL/min (ref >60)
GFR calc non Af Amer: >60 mL/min (ref >60)

## 2018-12-11 LAB — I-STAT TROPONIN, ED: Troponin i, poc: 0 ng/mL (ref 0.00–0.08)

## 2018-12-11 LAB — TROPONIN I
Troponin I: <0.03 ng/mL (ref <0.03)
Troponin I: <0.03 ng/mL (ref <0.03)

## 2018-12-11 LAB — GLUCOSE, CAPILLARY: Glucose-Capillary: 103 mg/dL — ABNORMAL HIGH (ref 70–99)

## 2018-12-11 MED ORDER — ENSURE ENLIVE PO LIQD
237.0000 mL | Freq: Two times a day (BID) | ORAL | Status: DC
  Administered 2018-12-12 (×2): 237 mL via ORAL
  Filled 2018-12-11 (×2): qty 237

## 2018-12-11 MED ORDER — ALPRAZOLAM 0.25 MG PO TABS: 0.2500 mg | ORAL_TABLET | Freq: Two times a day (BID) | ORAL | Status: DC | PRN

## 2018-12-11 MED ORDER — METOPROLOL SUCCINATE ER 25 MG PO TB24
25.0000 mg | ORAL_TABLET | Freq: Every day | ORAL | Status: DC
  Administered 2018-12-12 – 2018-12-13 (×2): 25 mg via ORAL
  Filled 2018-12-11 (×3): qty 1

## 2018-12-11 MED ORDER — MORPHINE SULFATE (PF) 2 MG/ML IV SOLN: 2.0000 mg | INTRAVENOUS | Status: DC | PRN

## 2018-12-11 MED ORDER — ENOXAPARIN SODIUM 40 MG/0.4ML ~~LOC~~ SOLN
40.0000 mg | SUBCUTANEOUS | Status: DC
  Administered 2018-12-11 – 2018-12-12 (×2): 40 mg via SUBCUTANEOUS
  Filled 2018-12-11 (×2): qty 0.4

## 2018-12-11 MED ORDER — ALUM & MAG HYDROXIDE-SIMETH 200-200-20 MG/5ML PO SUSP
30.0000 mL | Freq: Once | ORAL | Status: DC
  Filled 2018-12-11 (×2): qty 30

## 2018-12-11 MED ORDER — LORAZEPAM 1 MG PO TABS: 0.0000 mg | ORAL_TABLET | Freq: Two times a day (BID) | ORAL | Status: DC

## 2018-12-11 MED ORDER — LORAZEPAM 2 MG/ML IJ SOLN: 1.0000 mg | Freq: Four times a day (QID) | INTRAMUSCULAR | Status: DC | PRN

## 2018-12-11 MED ORDER — FOLIC ACID 1 MG PO TABS
1.0000 mg | ORAL_TABLET | Freq: Every day | ORAL | Status: DC
  Administered 2018-12-11 – 2018-12-13 (×3): 1 mg via ORAL
  Filled 2018-12-11 (×3): qty 1

## 2018-12-11 MED ORDER — NITROGLYCERIN 2 % TD OINT
0.5000 [in_us] | TOPICAL_OINTMENT | Freq: Once | TRANSDERMAL | Status: DC
  Filled 2018-12-11: qty 1

## 2018-12-11 MED ORDER — HYDRALAZINE HCL 25 MG PO TABS
25.0000 mg | ORAL_TABLET | Freq: Four times a day (QID) | ORAL | Status: DC | PRN
  Administered 2018-12-11: 25 mg via ORAL
  Filled 2018-12-11: qty 1

## 2018-12-11 MED ORDER — ONDANSETRON HCL 4 MG/2ML IJ SOLN: 4.0000 mg | Freq: Four times a day (QID) | INTRAMUSCULAR | Status: DC | PRN

## 2018-12-11 MED ORDER — ASPIRIN EC 325 MG PO TBEC
325.0000 mg | DELAYED_RELEASE_TABLET | Freq: Every day | ORAL | Status: DC
  Administered 2018-12-11 – 2018-12-13 (×3): 325 mg via ORAL
  Filled 2018-12-11 (×3): qty 1

## 2018-12-11 MED ORDER — THIAMINE HCL 100 MG/ML IJ SOLN
100.0000 mg | Freq: Every day | INTRAMUSCULAR | Status: DC
  Administered 2018-12-12: 100 mg via INTRAVENOUS
  Filled 2018-12-11: qty 2

## 2018-12-11 MED ORDER — ACETAMINOPHEN 325 MG PO TABS: 650.0000 mg | ORAL_TABLET | ORAL | Status: DC | PRN

## 2018-12-11 MED ORDER — VITAMIN B-1 100 MG PO TABS
100.0000 mg | ORAL_TABLET | Freq: Every day | ORAL | Status: DC
  Administered 2018-12-11 – 2018-12-13 (×2): 100 mg via ORAL
  Filled 2018-12-11 (×3): qty 1

## 2018-12-11 MED ORDER — LORAZEPAM 1 MG PO TABS
1.0000 mg | ORAL_TABLET | Freq: Four times a day (QID) | ORAL | Status: DC | PRN
  Administered 2018-12-12 – 2018-12-13 (×6): 1 mg via ORAL
  Filled 2018-12-11 (×6): qty 1

## 2018-12-11 MED ORDER — LORAZEPAM 1 MG PO TABS
0.0000 mg | ORAL_TABLET | Freq: Four times a day (QID) | ORAL | Status: DC
  Administered 2018-12-11: 2 mg via ORAL
  Filled 2018-12-11: qty 2

## 2018-12-11 MED ORDER — ADULT MULTIVITAMIN W/MINERALS CH
1.0000 | ORAL_TABLET | Freq: Every day | ORAL | Status: DC
  Administered 2018-12-11 – 2018-12-13 (×3): 1 via ORAL
  Filled 2018-12-11 (×3): qty 1

## 2018-12-11 MED ORDER — NITROGLYCERIN 0.4 MG SL SUBL: 0.4000 mg | SUBLINGUAL_TABLET | SUBLINGUAL | Status: DC | PRN

## 2018-12-11 MED ORDER — HYDROCHLOROTHIAZIDE 12.5 MG PO CAPS: 12.5000 mg | ORAL_CAPSULE | Freq: Every day | ORAL | Status: DC

## 2018-12-11 NOTE — ED Notes (Signed)
Pt ambulatory to RR w steady gait

## 2018-12-11 NOTE — Telephone Encounter (Signed)
Pt c/o BP issue: STAT if pt c/o blurred vision, one-sided weakness or slurred speech  1. What are your last 5 BP readings? 214/104  2. Are you having any other symptoms (ex. Dizziness, headache, blurred vision, passed out)? Dizziness   3. What is your BP issue? BP has been high

## 2018-12-11 NOTE — ED Notes (Signed)
Heart healthy tray ordered 

## 2018-12-11 NOTE — H&P (Signed)
[facility] and [facility]  [facility]  Ronald SciaraJoseph M [name] ZOX:[id number] DOB: [birth year] DOA: 12/11/2018  PCP: Patient, No Pcp Per  Patient coming from: Home   I [facility] personally briefly [facility]  Chief Complaint: Chest pains  HPI: Ronald SciaraJoseph M [name] is a 73 y.o. male with medical history significant of hypertension, chronic low back pain, elevated cholesterol, coronary artery disease, panic attacks and PTSD who presents emergency department complaining of sudden onset of chest pain catered in the substernal area and does not radiate.  The pain is crushing and associated with a tightness.  Severity is mild.  It is been present for 5 hours.  This has not happened to him before.  It is now resolved.  It was nothing relieved it and nothing made it worse although he tried no treatments.  He had no associated fever or shortness of breath.    In private conversation with me patient states that he has not been taking his blood pressure medicines regularly because he has been feeling fine.  He only takes them when he feels poorly.  He thought that his blood pressure is only high when he had symptoms.  In those times he took his blood pressure medicines and did  have significant relief. Educated about the long-term effects of uncontrolled high blood pressure and the fact that symptoms are usually not present when pressures are elevated.  Symptoms develop only before catastrophic events like stroke and heart attack.  As an aside patient mentions that his PTSD has been very severe lately and he has been drinking "heavy amounts" of alcohol.  Could not quantify how much.  He states he has had alcohol withdrawal in the past.  ED Course: EKG reassuring troponin negative, blood pressures greater than 200, and appeared to be rather anxious.  This pain improved once patient's blood pressure proved.  Review of Systems: As per HPI otherwise all other systems  reviewed and  negative.    Past Medical History:  Diagnosis Date  . Anginal pain (HCC) 01/2005  . Anxiety   . Arthritis    "knees"  . Chronic lower back pain   . Coronary artery disease   . Depression   . High cholesterol   . History of bronchitis    "used to get it alot" (08/25/2012)  . Hypertension   . Panic attacks   . PTSD (post-traumatic stress disorder)    "have been treated in the past" (08/25/2012)    Past Surgical History:  Procedure Laterality Date  . CARDIAC CATHETERIZATION  01/2005  . CORONARY ARTERY BYPASS GRAFT  01/2005   CABG X3  . Shrapnel  1969   LLE; left lateral thumb (required grafting)  . VARICOSE VEIN SURGERY  1980's   LLE    Social History   Social History Narrative  . Not on file     reports that he has quit smoking. He has a 10.00 pack-year smoking history. He has never used smokeless tobacco. He reports current alcohol use. He reports that he does not use drugs.  Allergies  Allergen Reactions  . Codeine Palpitations  . Vicodin [Hydrocodone-Acetaminophen] Nausea And Vomiting, Swelling and Palpitations    Family History  Problem Relation Age of Onset  . Diabetes Mother   . Heart disease Father   . Cancer Brother      Prior to Admission medications   Medication Sig Start Date End Date Taking? Authorizing Provider  metoprolol succinate (TOPROL-XL) 25  MG 24 hr tablet Take 1 tablet (25 mg total) by mouth daily. 04/25/18  Yes Georgie Chard D, NP  hydrochlorothiazide (MICROZIDE) 12.5 MG capsule Take 1 capsule (12.5 mg total) by mouth daily. Patient not taking: Reported on 08/02/2018 02/07/18 02/02/19  Lyn Records, MD  nitroGLYCERIN (NITROSTAT) 0.4 MG SL tablet Place 1 tablet (0.4 mg total) under the tongue every 5 (five) minutes as needed for chest pain. 12/09/15   Dyann Kief, PA-C    Physical Exam:  Constitutional: NAD, calm, comfortable Vitals:   12/11/18 1400 12/11/18 1415 12/11/18 1430 12/11/18 1445  BP: (!) 192/79 (!) 169/74 (!)  170/72 (!) 145/69  Pulse: 67 (!) 57 63 (!) 54  Resp: 19 11 16 13   Temp:      TempSrc:      SpO2: 95% 99% 99% 97%  Weight:      Height:       Eyes: PERRL, lids and conjunctivae normal ENMT: Mucous membranes are moist. Posterior pharynx clear of any exudate or lesions.Normal dentition.  Neck: normal, supple, no masses, no thyromegaly Respiratory: clear to auscultation bilaterally, no wheezing, no crackles. Normal respiratory effort. No accessory muscle use.  Cardiovascular: Regular rate and rhythm, no murmurs / rubs / gallops. No extremity edema. 2+ pedal pulses. No carotid bruits.  Abdomen: no tenderness, no masses palpated. No hepatosplenomegaly. Bowel sounds positive.  Musculoskeletal: no clubbing / cyanosis. No joint deformity upper and lower extremities. Good ROM, no contractures. Normal muscle tone.  Skin: no rashes, lesions, ulcers. No induration Neurologic: CN 2-12 grossly intact. Sensation intact, DTR normal. Strength 5/5 in all 4.  Psychiatric: Normal judgment and insight. Alert and oriented x 3.  Anxious mood sometimes appears to be at the verge of tears  Labs on Admission: I have personally reviewed following labs and imaging studies  CBC: Recent Labs  Lab 12/11/18 1320  WBC 5.6  HGB 14.2  HCT 43.3  MCV 93.7  PLT 105*   Basic Metabolic Panel: Recent Labs  Lab 12/11/18 1320  NA 134*  K 4.0  CL 101  CO2 22  GLUCOSE 119*  BUN 15  CREATININE 0.84  CALCIUM 9.0   GFR: Estimated Creatinine Clearance: 92.6 mL/min (by C-G formula based on SCr of 0.84 mg/dL). Urine analysis:    Component Value Date/Time   COLORURINE YELLOW 09/24/2015 1730   APPEARANCEUR CLEAR 09/24/2015 1730   LABSPEC 1.020 09/24/2015 1730   PHURINE 7.5 09/24/2015 1730   GLUCOSEU NEGATIVE 09/24/2015 1730   HGBUR NEGATIVE 09/24/2015 1730   BILIRUBINUR NEGATIVE 09/24/2015 1730   BILIRUBINUR neg 12/18/2014 1725   KETONESUR NEGATIVE 09/24/2015 1730   PROTEINUR NEGATIVE 09/24/2015 1730    UROBILINOGEN 2.0 (H) 09/24/2015 1730   NITRITE NEGATIVE 09/24/2015 1730   LEUKOCYTESUR TRACE (A) 09/24/2015 1730    Radiological Exams on Admission: Dg Chest 2 View  Result Date: 12/11/2018 CLINICAL DATA:  Chest pain. EXAM: CHEST - 2 VIEW COMPARISON:  08/01/2018 FINDINGS: Heart size and pulmonary vascularity are normal. No infiltrates or effusions. Minimal linear scarring in the left midzone. CABG. No significant bone abnormality. IMPRESSION: No active cardiopulmonary disease. Electronically Signed   By: Francene Boyers M.D.   On: 12/11/2018 13:02    EKG: Independently reviewed.  Sinus rhythm with nonspecific ST-T wave abnormalities  Assessment/Plan Principal Problem:   Hypertensive urgency Active Problems:   Ischemic chest pain (HCC)   Coronary artery disease involving coronary bypass graft of native heart without angina pectoris   Essential hypertension   ETOH  abuse   Hyperlipidemia   1.  Hypertensive urgency with associated ischemic chest pain: Patient's main problem appears to be his blood pressures getting so high that he is.  Seen chest pains.  Once his blood pressure resolves his pain to get better.  He will be restarted on Toprol and hydralazine will be ordered as needed as well as as needed nitroglycerin.  Will rule out myocardial infarction check serial enzymes and monitor overnight.  If blood pressure improved and enzymes negative would discharge home and plan for outpatient follow-up with cardiology.  He has previously been seen by Dr. Katrinka BlazingSmith.   2.  Coronary artery disease involving coronary bypass graft of native heart without angina pectoris, this is noted in the patient's history of CABG with a redo in 2011.  We will continue treatment with blood pressure lowering medications PRN nitroglycerin and morphine as needed.  Stress test obtained in 2016 with results as follows:Low risk stress nuclear study with very subtle reversible change along the apical lateral distribution. No  evidence of significant ischemia identified..  3.  Alcohol abuse: We will place patient on CIWA alcohol withdrawal prevention scale.  Monitor closely.  4.  Hyperlipidemia: Noted  DVT prophylaxis: Lovenox Code Status: Full code Family Communication: None present patient retains capacity Disposition Plan: Likely home in 24 hours Consults called: None Admission status: Observation   Lahoma Crockerheresa C Zakary Kimura MD FACP [facility] Pager [phone]  If 7PM-7AM, please contact night-coverage [email or web address] Password [email or web address]  12/11/2018, 3:19 PM

## 2018-12-11 NOTE — ED Provider Notes (Signed)
[facility]   CSN: [id number] Arrival date & time: 12/11/18  1219     History   Chief Complaint Chief Complaint  Patient presents with  . Chest Pain    HPI Babs SciaraJoseph M Wisecup is a 73 y.o. male.  73 year old male with prior medical history as detailed below presents for evaluation of chest discomfort. Patient reports acute onset of chest discomfort earlier this morning.  Patient describes substernal chest pressure.  This is been ongoing for the last 2 to 3 hours.  Patient also reports elevated blood pressures.  He reports that his normal blood pressure is typically in the 140-150 systolic range.  Over the last 2 to 3 days he has had higher blood pressures.  He apparently feels improved upon my evaluation.  His discomfort has decreased.  He denies associated nausea, vomiting, shortness of breath, diaphoresis, fever, or other acute complaint.  The history is provided by medical records and the patient.  Chest Pain  Pain location:  Substernal area Pain quality: crushing and tightness   Pain radiates to:  Does not radiate Pain severity:  Mild Onset quality:  Sudden Duration:  5 hours Timing:  Rare Progression:  Resolved Chronicity:  New Relieved by:  Nothing Worsened by:  Nothing Ineffective treatments:  None tried Associated symptoms: no fever and no shortness of breath     Past Medical History:  Diagnosis Date  . Anginal pain (HCC) 01/2005  . Anxiety   . Arthritis    "knees"  . Chronic lower back pain   . Coronary artery disease   . Depression   . High cholesterol   . History of bronchitis    "used to get it alot" (08/25/2012)  . Hypertension   . Panic attacks   . PTSD (post-traumatic stress disorder)    "have been treated in the past" (08/25/2012)    Patient Active Problem List   Diagnosis Date Noted  . ETOH abuse 12/09/2015  . Left knee pain 12/02/2015  . Coronary artery disease involving coronary bypass graft  of native heart without angina pectoris 08/07/2014  . Essential hypertension 08/07/2014  . Hyperlipidemia 08/07/2014    Past Surgical History:  Procedure Laterality Date  . CARDIAC CATHETERIZATION  01/2005  . CORONARY ARTERY BYPASS GRAFT  01/2005   CABG X3  . Shrapnel  1969   LLE; left lateral thumb (required grafting)  . VARICOSE VEIN SURGERY  1980's   LLE        Home Medications    Prior to Admission medications   Medication Sig Start Date End Date Taking? Authorizing Provider  aspirin EC 81 MG tablet Take 81 mg by mouth daily.    [provider]  hydrochlorothiazide (MICROZIDE) 12.5 MG capsule Take 1 capsule (12.5 mg total) by mouth daily. Patient not taking: Reported on 08/02/2018 02/07/18 02/02/19  Lyn RecordsSmith, Henry W, MD  metoprolol succinate (TOPROL-XL) 25 MG 24 hr tablet Take 1 tablet (25 mg total) by mouth daily. 04/25/18   Filbert SchilderMcDaniel, Jill D, NP  nitroGLYCERIN (NITROSTAT) 0.4 MG SL tablet Place 1 tablet (0.4 mg total) under the tongue every 5 (five) minutes as needed for chest pain. 12/09/15   Dyann KiefLenze, Michele M, PA-C    Family History Family History  Problem Relation Age of Onset  . Diabetes Mother   . Heart disease Father   . Cancer Brother     Social History Social History   Tobacco Use  . Smoking status: Former Smoker  Packs/day: 2.00    Years: 5.00    Pack years: 10.00  . Smokeless tobacco: Never Used  . Tobacco comment: 08/25/2012 "quit smoking cigarettes 40 yr ago"  Substance Use Topics  . Alcohol use: Yes    Comment: social  . Drug use: No    Comment: 08/25/2012 "last marijuana 10 years ago"     Allergies   Codeine and Vicodin [hydrocodone-acetaminophen]   Review of Systems Review of Systems  Constitutional: Negative for fever.  Respiratory: Negative for shortness of breath.   Cardiovascular: Positive for chest pain.  All other systems reviewed and are negative.    Physical Exam Updated Vital Signs BP (!) 211/91 (BP Location: Right Arm)    Pulse 80   Temp 97.7 F (36.5 C) (Oral)   Resp 16   SpO2 99%   Physical Exam Vitals signs and nursing note reviewed.  Constitutional:      General: He is not in acute distress.    Appearance: He is well-developed.  HENT:     Head: Normocephalic and atraumatic.  Eyes:     Conjunctiva/sclera: Conjunctivae normal.     Pupils: Pupils are equal, round, and reactive to light.  Neck:     Musculoskeletal: Normal range of motion and neck supple.  Cardiovascular:     Rate and Rhythm: Normal rate and regular rhythm.     Heart sounds: Normal heart sounds.  Pulmonary:     Effort: Pulmonary effort is normal. No respiratory distress.     Breath sounds: Normal breath sounds.  Abdominal:     General: There is no distension.     Palpations: Abdomen is soft.     Tenderness: There is no abdominal tenderness.  Musculoskeletal: Normal range of motion.        General: No deformity.  Skin:    General: Skin is warm and dry.  Neurological:     Mental Status: He is alert and oriented to person, place, and time.      ED Treatments / Results  Labs (all labs ordered are listed, but only abnormal results are displayed) Labs Reviewed  BASIC METABOLIC PANEL  CBC  I-STAT TROPONIN, ED    EKG EKG Interpretation  Date/Time:  Monday December 11 2018 12:28:59 EST Ventricular Rate:  77 PR Interval:  174 QRS Duration: 84 QT Interval:  398 QTC Calculation: 450 R Axis:   86 Text Interpretation:  Normal sinus rhythm Nonspecific ST abnormality Abnormal ECG Confirmed by Kristine RoyalMessick, Peter [phone]([id number]) on 12/11/2018 12:35:24 PM   Radiology No results found.  Procedures Procedures (including critical care time)  Medications Ordered in ED Medications  nitroGLYCERIN (NITROGLYN) 2 % ointment 0.5 inch (has no administration in time range)     Initial Impression / Assessment and Plan / ED Course  I have reviewed the triage vital signs and the nursing notes.  Pertinent labs & imaging results that were  available during my care of the patient were reviewed by me and considered in my medical decision making (see chart for details).     MDM  Screen complete  Patient is presenting for evaluation of chest discomfort.  This was associated with elevated blood pressures.  Upon arrival to the ED his pain is improved.  Concurrently his blood pressure has decreased.  Screening labs are without significant abnormality.  Initial EKG is not suggestive of acute ischemia.  Initial troponin is negative.  Patient would likely benefit from admission for further work-up and treatment.  Hospitalist Willette Pa([facility]) service is aware  of case and will evaluate for same.  Final Clinical Impressions(s) / ED Diagnoses   Final diagnoses:  Chest pain, unspecified type  Hypertension, unspecified type    ED Discharge Orders    None       Wynetta Fines, MD 12/11/18 1515

## 2018-12-11 NOTE — ED Triage Notes (Signed)
Pt in c/o chest pain and hypertension, chest pain started at work today, reports intermittent dizziness for the last few weeks

## 2018-12-11 NOTE — Telephone Encounter (Signed)
Lightheaded last couple days so went to [facility] from work to check BP. Had nosebleed last week. Check at their pharmacy by manual and auto cuff.  Both got same reading 214/104. Earlier today had indigestion, felt like something was tight on chest.  Lasted 5-10 min.  Relieved on its own. Did not take NTG.  Reports has been under a lot of stress lately and feeling a good amount of anxiety. Has hx of CAD  Headed to ER currently, someone is driving him.  Call to Trish ([employer]master) to inform.

## 2018-12-12 DIAGNOSIS — F329 Major depressive disorder, single episode, unspecified: Secondary | ICD-10-CM | POA: Diagnosis present

## 2018-12-12 DIAGNOSIS — F1010 Alcohol abuse, uncomplicated: Secondary | ICD-10-CM

## 2018-12-12 DIAGNOSIS — G8929 Other chronic pain: Secondary | ICD-10-CM | POA: Diagnosis present

## 2018-12-12 DIAGNOSIS — E7800 Pure hypercholesterolemia, unspecified: Secondary | ICD-10-CM | POA: Diagnosis present

## 2018-12-12 DIAGNOSIS — F10239 Alcohol dependence with withdrawal, unspecified: Secondary | ICD-10-CM | POA: Diagnosis present

## 2018-12-12 DIAGNOSIS — E785 Hyperlipidemia, unspecified: Secondary | ICD-10-CM | POA: Diagnosis present

## 2018-12-12 DIAGNOSIS — F4310 Post-traumatic stress disorder, unspecified: Secondary | ICD-10-CM | POA: Diagnosis present

## 2018-12-12 DIAGNOSIS — Z87891 Personal history of nicotine dependence: Secondary | ICD-10-CM | POA: Diagnosis not present

## 2018-12-12 DIAGNOSIS — F419 Anxiety disorder, unspecified: Secondary | ICD-10-CM | POA: Diagnosis present

## 2018-12-12 DIAGNOSIS — Z79899 Other long term (current) drug therapy: Secondary | ICD-10-CM | POA: Diagnosis not present

## 2018-12-12 DIAGNOSIS — K7460 Unspecified cirrhosis of liver: Secondary | ICD-10-CM | POA: Diagnosis present

## 2018-12-12 DIAGNOSIS — F410 Panic disorder [episodic paroxysmal anxiety] without agoraphobia: Secondary | ICD-10-CM | POA: Diagnosis present

## 2018-12-12 DIAGNOSIS — T465X6A Underdosing of other antihypertensive drugs, initial encounter: Secondary | ICD-10-CM | POA: Diagnosis present

## 2018-12-12 DIAGNOSIS — Z951 Presence of aortocoronary bypass graft: Secondary | ICD-10-CM | POA: Diagnosis not present

## 2018-12-12 DIAGNOSIS — F10939 Alcohol use, unspecified with withdrawal, unspecified: Secondary | ICD-10-CM | POA: Diagnosis present

## 2018-12-12 DIAGNOSIS — R0789 Other chest pain: Secondary | ICD-10-CM | POA: Diagnosis present

## 2018-12-12 DIAGNOSIS — I160 Hypertensive urgency: Secondary | ICD-10-CM | POA: Diagnosis not present

## 2018-12-12 DIAGNOSIS — Z885 Allergy status to narcotic agent status: Secondary | ICD-10-CM | POA: Diagnosis not present

## 2018-12-12 DIAGNOSIS — I25810 Atherosclerosis of coronary artery bypass graft(s) without angina pectoris: Secondary | ICD-10-CM | POA: Diagnosis present

## 2018-12-12 DIAGNOSIS — I10 Essential (primary) hypertension: Secondary | ICD-10-CM | POA: Diagnosis present

## 2018-12-12 DIAGNOSIS — D6959 Other secondary thrombocytopenia: Secondary | ICD-10-CM | POA: Diagnosis present

## 2018-12-12 DIAGNOSIS — Z7982 Long term (current) use of aspirin: Secondary | ICD-10-CM | POA: Diagnosis not present

## 2018-12-12 DIAGNOSIS — Z7141 Alcohol abuse counseling and surveillance of alcoholic: Secondary | ICD-10-CM | POA: Diagnosis not present

## 2018-12-12 DIAGNOSIS — Z8249 Family history of ischemic heart disease and other diseases of the circulatory system: Secondary | ICD-10-CM | POA: Diagnosis not present

## 2018-12-12 DIAGNOSIS — Z91128 Patient's intentional underdosing of medication regimen for other reason: Secondary | ICD-10-CM | POA: Diagnosis not present

## 2018-12-12 LAB — TROPONIN I

## 2018-12-12 MED ORDER — LOSARTAN POTASSIUM 25 MG PO TABS
25.0000 mg | ORAL_TABLET | Freq: Every day | ORAL | Status: DC
  Administered 2018-12-12 – 2018-12-13 (×2): 25 mg via ORAL
  Filled 2018-12-12 (×2): qty 1

## 2018-12-12 NOTE — Progress Notes (Signed)
Report given to RN in 3E

## 2018-12-12 NOTE — Consult Note (Addendum)
Cardiology Consult    Patient ID: [name] MRN: [id number], DOB/AGE: 1946-10-03   Admit date: 12/11/2018 Date of Consult: 12/12/2018  Primary Physician: PCP not listed in system. Primary Cardiologist: Lesleigh Noe, MD Requesting Provider: Brett Canales, MD.  Patient Profile    [name] is a 73 y.o. male with a history of CAD s/p CABG x3 with early graft failure and re-do surgery in 2006, hypertension, hyperlipidemia, obesity, and PTSD, who is being seen today for the evaluation of chest pain at the request of Dr. Willette Pa.  History of Present Illness    [name] is a 73 year old male with the above history who is followed by Dr. Katrinka Blazing. He has a history of being non-compliant with blood pressure and cholesterol medications and has had inconsistent follow-up. Patient last saw Dr. Katrinka Blazing on 02/07/2018 at which time he denied any cardiac symptoms.   Patient reports he presented to the ED for evaluation of high blood pressure. Patient reports dizziness with associated blurry vision yesterday, so he went to [facility] to check his blood pressure on their automatic machine. Patient reports BP was 214/104 at that time. The Pharmacist at [facility] manually checked his BP and it was reportedly 116/103. The Pharmacist advised the patient to come to the ED immediately which the patient did. Patient also reports some mild substernal chest tightness yesterday. Onset occurred at rest and patient states it only lasted a couple of minutes. Patient had some mild palpitations and "belching with the pain." Pain improved when patient got up and started walking. No associated shortness of breath, diaphoresis, nausea/vomiting. He denies any recent exertional chest pain or shortness of breath. Patient denies any recent fevers or illness. He does report some recent weight loss which he attributes to his alcohol use. Patient currently drinks a fifth or a pint of liquor (mostly Vodka) daily, which he has  done for many years. Patient knows that this is unhealthy and is scheduled to see a psychiatrist for his PTSD/alcohol abuse later this month.   In the ED, patient hypertensive with systolic BP in the 200's at times. EKG showed no acute ischemic changes. I-stat troponin negative. Chest x-ray showed no acute findings. WBC 5.6, Hgb 14.2, Plts 105. Na 134, K 4.0, Glucose 119, SCr 0.84. Per chart review, pain improved with improvement of BP. Patient admitted for further evaluation.  Past Medical History   Past Medical History:  Diagnosis Date  . Anginal pain (HCC) 01/2005  . Anxiety   . Arthritis    "knees"  . Chronic lower back pain   . Coronary artery disease   . Depression   . Hepatitis    "don't know what kind; they treated me for it; was a long time ago" (12/11/2018)  . High cholesterol   . History of bronchitis    "used to get it alot" (08/25/2012)  . Hypertension   . Panic attacks   . PTSD (post-traumatic stress disorder)    "have been treated in the past" (08/25/2012)    Past Surgical History:  Procedure Laterality Date  . CARDIAC CATHETERIZATION  01/2005  . CORONARY ARTERY BYPASS GRAFT  01/2005   CABG X3  . Shrapnel Left 1969   LLE; left lateral thumb (required grafting)  . VARICOSE VEIN SURGERY  1980's   LLE     Allergies  Allergies  Allergen Reactions  . Codeine Palpitations  . Vicodin [Hydrocodone-Acetaminophen] Nausea And Vomiting, Swelling and Palpitations    Inpatient Medications    .  alum & mag hydroxide-simeth  30 mL Oral Once  . aspirin EC  325 mg Oral Daily  . enoxaparin (LOVENOX) injection  40 mg Subcutaneous Q24H  . feeding supplement (ENSURE ENLIVE)  237 mL Oral BID BM  . folic acid  1 mg Oral Daily  . metoprolol succinate  25 mg Oral Daily  . multivitamin with minerals  1 tablet Oral Daily  . thiamine  100 mg Oral Daily   Or  . thiamine  100 mg Intravenous Daily    Family History    Family History  Problem Relation Age of Onset  . Diabetes  Mother   . Heart disease Father   . Cancer Brother    He indicated that his mother is deceased. He indicated that his father is deceased. He indicated that his brother is deceased.   Social History    Social History   Socioeconomic History  . Marital status: Divorced    Spouse name: Not on file  . Number of children: Not on file  . Years of education: Not on file  . Highest education level: Not on file  Occupational History  . Not on file  Social Needs  . Financial resource strain: Not on file  . Food insecurity:    Worry: Not on file    Inability: Not on file  . Transportation needs:    Medical: Not on file    Non-medical: Not on file  Tobacco Use  . Smoking status: Former Smoker    Packs/day: 2.00    Years: 5.00    Pack years: 10.00  . Smokeless tobacco: Never Used  . Tobacco comment: 08/25/2012 "quit smoking cigarettes 40 yr ago"  Substance and Sexual Activity  . Alcohol use: Yes    Alcohol/week: 60.0 standard drinks    Types: 60 Standard drinks or equivalent per week    Comment: 12/11/2018 "~ 1 pint of vodka/day"  . Drug use: Yes    Types: Marijuana    Comment: 08/25/2012 "last marijuana 10 years ago"  . Sexual activity: Yes  Lifestyle  . Physical activity:    Days per week: Not on file    Minutes per session: Not on file  . Stress: Not on file  Relationships  . Social connections:    Talks on phone: Not on file    Gets together: Not on file    Attends religious service: Not on file    Active member of club or organization: Not on file    Attends meetings of clubs or organizations: Not on file    Relationship status: Not on file  . Intimate partner violence:    Fear of current or ex partner: Not on file    Emotionally abused: Not on file    Physically abused: Not on file    Forced sexual activity: Not on file  Other Topics Concern  . Not on file  Social History Narrative  . Not on file     Review of Systems    Review of Systems  Constitutional:  Positive for weight loss. Negative for chills, diaphoresis and fever.  HENT: Negative for congestion and sore throat.   Eyes: Positive for blurred vision. Negative for double vision.  Respiratory: Negative for cough and shortness of breath.   Cardiovascular: Positive for chest pain and palpitations. Negative for orthopnea, leg swelling and PND.  Gastrointestinal: Negative for blood in stool, nausea and vomiting.  Genitourinary: Negative for hematuria.  Musculoskeletal: Positive for neck pain. Negative for  myalgias.  Neurological: Positive for dizziness.  Psychiatric/Behavioral: Positive for substance abuse.  All other systems reviewed and are negative.   Physical Exam    Blood pressure (!) 152/79, pulse (!) 57, temperature (!) 97.4 F (36.3 C), temperature source Oral, resp. rate 18, height 5\' 10"  (1.778 m), weight 93.4 kg, SpO2 97 %.  General: 73 y.o. African-American male resting comfortably in no acute distress. Pleasant and cooperative. HEENT: Normal  Neck: Supple. No carotid bruits or JVD appreciated. Lungs: No increased work of breathing. Clear to auscultation bilaterally. No wheezes, rhonchi, or rales. Heart: Mildly bradycardic with regular rhythm. Distinct S1 and S2. No murmurs, gallops, or rubs.  Abdomen: Soft, non-distended, and non-tender to palpation. Bowel sounds present. Extremities: No lower extremity edema. Radial pulses 2+ and equal bilaterally. Neuro: Alert and oriented x3. No focal deficits. Moves all extremities spontaneously. Psych: Normal affect.  Labs    Troponin [facility](Point of Care Test) Recent Labs    12/11/18 1330  TROPIPOC 0.00   Recent Labs    12/11/18 1801 12/11/18 2131 12/12/18 0009  TROPONINI <0.03 <0.03 <0.03   Lab Results  Component Value Date   WBC 5.5 12/11/2018   HGB 14.0 12/11/2018   HCT 42.4 12/11/2018   MCV 92.4 12/11/2018   PLT 107 (L) 12/11/2018    Recent Labs  Lab 12/11/18 1320 12/11/18 1801  NA 134*  --   K 4.0  --   CL 101   --   CO2 22  --   BUN 15  --   CREATININE 0.84 0.77  CALCIUM 9.0  --   GLUCOSE 119*  --    Lab Results  Component Value Date   CHOL 251 (H) 08/26/2012   HDL 36 (L) 08/26/2012   LDLCALC 159 (H) 08/26/2012   TRIG 282 (H) 08/26/2012   Lab Results  Component Value Date   DDIMER 1.98 (H) 01/11/2014     Radiology Studies    Dg Chest 2 View  Result Date: 12/11/2018 CLINICAL DATA:  Chest pain. EXAM: CHEST - 2 VIEW COMPARISON:  08/01/2018 FINDINGS: Heart size and pulmonary vascularity are normal. No infiltrates or effusions. Minimal linear scarring in the left midzone. CABG. No significant bone abnormality. IMPRESSION: No active cardiopulmonary disease. Electronically Signed   By: Francene BoyersJames  Maxwell M.D.   On: 12/11/2018 13:02    EKG     EKG: EKG was personally reviewed and demonstrates: normal sinus rhythm, rate 77 bpm, with no acute ischemic changes.   Telemetry: Telemetry was personally reviewed and demonstrates: Sinus rhythm with heart rates in the high 40's to 60's.   Cardiac Imaging    Echocardiogram 02/21/2014: Study Conclusions: - Left ventricle: The cavity size was normal. Wall thickness was increased in a pattern of moderate LVH. Systolic function was normal. The estimated ejection fraction was in the range of 50% to 55%. Wall motion was normal; there were no regional wall motion abnormalities. Left ventricular diastolic function parameters were normal. - Aortic valve: Trivial regurgitation. - Pulmonary arteries: PA peak pressure: 44mm Hg (S). - Pericardium, extracardiac: A trivial pericardial effusion was identified. Features were not consistent with tamponade physiology.  Assessment & Plan    1. Chest Pain with Known CAD s/p CABG - Patient reports mild chest tightness for a couple of minutes yesterday that improved with activity. - EKG showed no acute ischemic changes. - Troponin negative x3. - Patient currently chest pain free.  - Suspect chest  pain secondary to severe hypertension. No further inpatient work-up  necessary at this time.  2. Hypertensive Urgency - Systolic BP in the 200's in the ED. - Improved but still elevated at 152/79 this morning. - Currently on home Toprol and PRN Hydralazine.  - Will start Losartan 25mg  daily.   3. Alcohol Use Disorder - Drink a fifth to a pint of liquor daily. - Patient reports he is scheduled to see a psychiatrist for his PTSD/alcohol abuse later this month. - Complete cessation is advised. - Currently on CIWA protocol. - Patient asked about his current liver function. Will defer checking hepatic function to primary team.   Signed, Corrin Parker, PA-C 12/12/2018, 10:05 AM  For questions or updates, please contact   Please consult [email or web address] for contact info under Cardiology/STEMI.  Personally seen and examined. Agree with above.   73 year old with prior CABG and redo CABG in 2006 here with elevated blood pressure sent by pharmacy with atypical chest discomfort, dizziness currently resolved.  He is feeling better.  Has alcoholism.  Currently no chest pain, no shortness of breath, laying comfortable in bed.  GEN: Well nourished, well developed, in no acute distress  HEENT: normal  Neck: no JVD, carotid bruits, or masses Cardiac: RRR; no murmurs, rubs, or gallops,no edema  Respiratory:  clear to auscultation bilaterally, normal work of breathing GI: soft, nontender, nondistended, + BS, overweight MS: no deformity or atrophy  Skin: warm and dry, no rash Neuro:  Alert and Oriented x 3, Strength and sensation are intact Psych: euthymic mood, full affect  Troponins are negative EKG unremarkable, no change from prior, subtle T wave inversion noted in 1, aVL.  Assessment and plan  Atypical chest pain - Plan will be to treat underlying hypertension.  Troponins are negative.  ECG unchanged from prior.  No further cardiac testing at this time.  Nuclear stress test in 2016 was low  risk.  No need for further cardiac testing at this time.  Continue to treat his risk factors.  Essential hypertension - We will add losartan 25 mg once a day to his regimen.  Encouraged him to follow-up closely with his primary care physician.  Alcohol cessation will also help with his blood pressure.  Coronary artery disease - CABG with redo CABG 2006.  Last stress test low risk.  Troponins currently low risk.  Alcoholism -Continue to encourage cessation.  [facility] will sign off.   Medication Recommendations: Losartan 25 Other recommendations (labs, testing, etc): None Follow up as an outpatient: As previously scheduled, Dr. Katrinka Blazing.  Donato Schultz, MD

## 2018-12-12 NOTE — Progress Notes (Signed)
Progress Note    [name]  ZOX:[id number] DOB: 25-Apr-1946  DOA: 12/11/2018 PCP: System, Pcp Not In    Brief Narrative:     Medical records reviewed and are as summarized below:  [name] is an 73 y.o. male with medical history significant of hypertension, chronic low back pain, elevated cholesterol, coronary artery disease, panic attacks and PTSD who presents emergency department complaining of sudden onset of chest pain catered in the substernal area and does not radiate.  The pain is crushing and associated with a tightness.  Severity is mild.  It is been present for 5 hours.  This has not happened to him before.  It is now resolved.  It was nothing relieved it and nothing made it worse although he tried no treatments.  He had no associated fever or shortness of breath.    Assessment/Plan:   Principal Problem:   Hypertensive urgency Active Problems:   Coronary artery disease involving coronary bypass graft of native heart without angina pectoris   Essential hypertension   Hyperlipidemia   ETOH abuse   Ischemic chest pain (HCC)   Alcohol withdrawal (HCC)   Alcohol abuse with withdrawal -CIWA of 11 and I suspect will continue to go up as he goes through withdrawal -social work consult as patient wants to stop drinking -CIWA  Hypertensive urgency with associated ischemic chest pain:  -CE negative -seen by cardiology -on BB -losartan added  Coronary artery disease involving coronary bypass graft of native heart without angina pectoris, this is noted in the patient's history of CABG with a redo in 2011.  - Stress test obtained in 2016 with results as follows:Low risk -seen by cardiology: Plan will be to treat underlying hypertension        Family Communication/Anticipated D/C date and plan/Code Status   DVT prophylaxis: Lovenox ordered. Code Status: Full Code.  Family Communication:  Disposition Plan: continue with CIWA and ativan  IV   Medical Consultants:    cards  Subjective:   Wants to stop drinking-- has 2 young children  Objective:    Vitals:   12/12/18 0552 12/12/18 1034 12/12/18 1135 12/12/18 1200  BP: (!) 152/79 (!) 163/69 (!) 156/71 (!) 149/59  Pulse: (!) 57  (!) 59 (!) 56  Resp:   18   Temp: (!) 97.4 F (36.3 C)  98 F (36.7 C)   TempSrc: Oral  Oral   SpO2: 97%  98%   Weight:      Height:        Intake/Output Summary (Last 24 hours) at 12/12/2018 1418 Last data filed at 12/12/2018 1300 Gross per 24 hour  Intake 600 ml  Output 4 ml  Net 596 ml   Filed Weights   12/11/18 1326  Weight: 93.4 kg    Exam: In bed, mildly tremulous this AM A+Ox3, anxious appearing rrr No wheezing, increased work of breathing +BS, distended but soft Min LE edema No rashes  Data Reviewed:   I have personally reviewed following labs and imaging studies:  Labs: Labs show the following:   Basic Metabolic Panel: Recent Labs  Lab 12/11/18 1320 12/11/18 1801  NA 134*  --   K 4.0  --   CL 101  --   CO2 22  --   GLUCOSE 119*  --   BUN 15  --   CREATININE 0.84 0.77  CALCIUM 9.0  --    GFR Estimated Creatinine Clearance: 97.3 mL/min (by C-G formula based on  SCr of 0.77 mg/dL). Liver Function Tests: No results for input(s): AST, ALT, ALKPHOS, BILITOT, PROT, ALBUMIN in the last 168 hours. No results for input(s): LIPASE, AMYLASE in the last 168 hours. No results for input(s): AMMONIA in the last 168 hours. Coagulation profile No results for input(s): INR, PROTIME in the last 168 hours.  CBC: Recent Labs  Lab 12/11/18 1320 12/11/18 1801  WBC 5.6 5.5  HGB 14.2 14.0  HCT 43.3 42.4  MCV 93.7 92.4  PLT 105* 107*   Cardiac Enzymes: Recent Labs  Lab 12/11/18 1801 12/11/18 2131 12/12/18 0009  TROPONINI <0.03 <0.03 <0.03   BNP (last 3 results) No results for input(s): PROBNP in the last 8760 hours. CBG: Recent Labs  Lab 12/11/18 2117  GLUCAP 103*   D-Dimer: No results for  input(s): DDIMER in the last 72 hours. Hgb A1c: No results for input(s): HGBA1C in the last 72 hours. Lipid Profile: No results for input(s): CHOL, HDL, LDLCALC, TRIG, CHOLHDL, LDLDIRECT in the last 72 hours. Thyroid function studies: No results for input(s): TSH, T4TOTAL, T3FREE, THYROIDAB in the last 72 hours.  Invalid input(s): FREET3 Anemia work up: No results for input(s): VITAMINB12, FOLATE, FERRITIN, TIBC, IRON, RETICCTPCT in the last 72 hours. Sepsis Labs: Recent Labs  Lab 12/11/18 1320 12/11/18 1801  WBC 5.6 5.5    Microbiology No results found for this or any previous visit (from the past 240 hour(s)).  Procedures and diagnostic studies:  Dg Chest 2 View  Result Date: 12/11/2018 CLINICAL DATA:  Chest pain. EXAM: CHEST - 2 VIEW COMPARISON:  08/01/2018 FINDINGS: Heart size and pulmonary vascularity are normal. No infiltrates or effusions. Minimal linear scarring in the left midzone. CABG. No significant bone abnormality. IMPRESSION: No active cardiopulmonary disease. Electronically Signed   By: Francene BoyersJames  Maxwell M.D.   On: 12/11/2018 13:02    Medications:   . alum & mag hydroxide-simeth  30 mL Oral Once  . aspirin EC  325 mg Oral Daily  . enoxaparin (LOVENOX) injection  40 mg Subcutaneous Q24H  . feeding supplement (ENSURE ENLIVE)  237 mL Oral BID BM  . folic acid  1 mg Oral Daily  . losartan  25 mg Oral Daily  . metoprolol succinate  25 mg Oral Daily  . multivitamin with minerals  1 tablet Oral Daily  . thiamine  100 mg Oral Daily   Or  . thiamine  100 mg Intravenous Daily   Continuous Infusions:   LOS: 0 days   Kristi ArtJessica U Sheneka Schrom  [facility]   *Please refer to [email or web address], password [email or web address] to get updated schedule on who will round on this patient, as hospitalists switch teams weekly. If 7PM-7AM, please contact night-coverage at [email or web address], password [email or web address] for any overnight needs.  12/12/2018, 2:18 PM

## 2018-12-13 LAB — BASIC METABOLIC PANEL
Anion gap: 11 (ref 5–15)
BUN: 17 mg/dL (ref 8–23)
CHLORIDE: 102 mmol/L (ref 98–111)
CO2: 23 mmol/L (ref 22–32)
Calcium: 9.2 mg/dL (ref 8.9–10.3)
Creatinine, Ser: 0.88 mg/dL (ref 0.61–1.24)
GFR calc Af Amer: >60 mL/min (ref >60)
GFR calc non Af Amer: >60 mL/min (ref >60)
Glucose, Bld: 96 mg/dL (ref 70–99)
Potassium: 4.1 mmol/L (ref 3.5–5.1)
SODIUM: 136 mmol/L (ref 135–145)

## 2018-12-13 LAB — CBC
HCT: 43.3 % (ref 39.0–52.0)
Hemoglobin: 14.1 g/dL (ref 13.0–17.0)
MCH: 30.4 pg (ref 26.0–34.0)
MCHC: 32.6 g/dL (ref 30.0–36.0)
MCV: 93.3 fL (ref 80.0–100.0)
NRBC: 0 % (ref 0.0–0.2)
Platelets: 102 10*3/uL — ABNORMAL LOW (ref 150–400)
RBC: 4.64 MIL/uL (ref 4.22–5.81)
RDW: 12.9 % (ref 11.5–15.5)
WBC: 5.9 10*3/uL (ref 4.0–10.5)

## 2018-12-13 LAB — GLUCOSE, CAPILLARY: GLUCOSE-CAPILLARY: 139 mg/dL — AB (ref 70–99)

## 2018-12-13 MED ORDER — ALPRAZOLAM 0.25 MG PO TABS: 0.2500 mg | ORAL_TABLET | Freq: Two times a day (BID) | ORAL | 0 refills | Status: DC | PRN

## 2018-12-13 MED ORDER — LOSARTAN POTASSIUM 25 MG PO TABS: 25.0000 mg | ORAL_TABLET | Freq: Every day | ORAL | 0 refills | Status: DC

## 2018-12-13 MED ORDER — FOLIC ACID 1 MG PO TABS: 1.0000 mg | ORAL_TABLET | Freq: Every day | ORAL | 0 refills | Status: DC

## 2018-12-13 MED ORDER — THIAMINE HCL 100 MG PO TABS: 100.0000 mg | ORAL_TABLET | Freq: Every day | ORAL | 0 refills | Status: DC

## 2018-12-13 NOTE — Clinical Social Work Note (Signed)
Clinical Social Work Assessment  Patient Details  Name: [name] MRN: [id number] Date of Birth: 01/06/1946  Date of referral:  12/13/18               Reason for consult:  Substance Use/ETOH Abuse                Permission sought to share information with:  Family Supports Permission granted to share information::  Yes, Verbal Permission Granted  Name::        Agency::     Relationship::  Spouse at bedside  Contact Information:     Housing/Transportation Living arrangements for the past 2 months:  Apartment Source of Information:  Patient, Medical Team Patient Interpreter Needed:  None Criminal Activity/Legal Involvement Pertinent to Current Situation/Hospitalization:  No - Comment as needed Significant Relationships:  Siblings, Spouse Lives with:  Spouse Do you feel safe going back to the place where you live?  Yes Need for family participation in patient care:  Yes (Comment)  Care giving concerns:  Alcohol abuse.   Social Worker assessment / plan:  CSW met with patient. Wife at bedside. CSW introduced role and inquired about interest in substance abuse resources. Patient wanted wife to stay at bedside during conversation. Patient agreeable to receiving resources. Provided inpatient and outpatient options within [place] and surrounding counties. Patient confirmed he has an appt with a psychiatrist later this month for mental health and substance abuse treatment. No further concerns. CSW signing off as patient is discharging home today.  Employment status:  Retired Forensic scientist:  Medicare PT Recommendations:  Not assessed at this time Information / Referral to community resources:  Residential Substance Abuse Treatment Options, Outpatient Substance Abuse Treatment Options  Patient/Family's Response to care:  Patient agreeable to receiving resources. Patient's wife and siblings supportive and involved in patient's care. Patient appreciated social work  intervention.  Patient/Family's Understanding of and Emotional Response to Diagnosis, Current Treatment, and Prognosis:  Patient has a good understanding of the reason for admission and social work consult. Patient appears happy with hospital care.  Emotional Assessment Appearance:  Appears stated age Attitude/Demeanor/Rapport:  Engaged, Gracious Affect (typically observed):  Accepting, Appropriate, Calm, Pleasant Orientation:  Oriented to Self, Oriented to Place, Oriented to  Time, Oriented to Situation Alcohol / Substance use:  Alcohol Use Psych involvement (Current and /or in the community):  No (Comment)  Discharge Needs  Concerns to be addressed:  Care Coordination Readmission within the last 30 days:  No Current discharge risk:  Substance Abuse Barriers to Discharge:  No Barriers Identified   Candie Chroman, LCSW 12/13/2018, 11:50 AM

## 2018-12-13 NOTE — Discharge Summary (Signed)
Physician Discharge Summary  [name] [id number] DOB: December 04, 1945 DOA: 12/11/2018  PCP: System, Pcp Not In  Admit date: 12/11/2018 Discharge date: 12/13/2018  Admitted From: Home Disposition:  Home  Discharge Condition:Stable CODE STATUS:FULL Diet recommendation: Heart Healthy  Brief/Interim Summary: Ronald SciaraJoseph M [name] is an 73 y.o. male with medical history significant ofhypertension, chronic low back pain, elevated cholesterol, coronary artery disease, panic attacks and PTSD who presents emergency department complaining of sudden onset of chest pain catered in the substernal area and does not radiate. His chest pain has  resolved after admission.  His chest pain was atypical.  Cardiology was following and did not think that he needs further testing.  EKG did not show any ischemic changes.  Troponins were normal.  He was also found to be in alcohol withdrawal and was started on CIWA protocol.  His hospital course course was also remarkable for hypertensive urgency. This morning he is alert and oriented and comfortable.  Hemodynamically stable. He is stable for discharge to home today.  Following problems were addressed during his hospitalization:  Alcohol abuse with withdrawal -Currently he is alert and oriented x4 -Social worker consulted for providing alcohol rehabilitation resources -Continue thiamine and folic acid -Counseled for alcohol cessation.  He will also follow-up with psychiatry for history of PTSD and alcohol dependence.  Hypertensive urgency with associated ischemic chest pain:  -CE negative -seen by cardiology -on BB -losartan added -BP stable now  Coronary artery disease involving coronary bypass graft  -History of CABG with  in 2011.  -Stress test obtained in 2016 with results as follows:Low risk -Seen by cardiology: No further testing planned.  Thrombocytopenia Most likely associated with chronic alcohol abuse with possible liver cirrhosis.  Will  recommend follow-up with PCP as an outpatient.  Discharge Diagnoses:  Principal Problem:   Hypertensive urgency Active Problems:   Coronary artery disease involving coronary bypass graft of native heart without angina pectoris   Essential hypertension   Hyperlipidemia   ETOH abuse   Ischemic chest pain (HCC)   Alcohol withdrawal (HCC)    Discharge Instructions  Discharge Instructions    Diet - low sodium heart healthy   Complete by:  As directed    Discharge instructions   Complete by:  As directed    1)Follow up with your PCP in a week. 2)Please stop drinking. 3)Take prescribed medications as instructed.   Increase activity slowly   Complete by:  As directed      Allergies as of 12/13/2018      Reactions   Codeine Palpitations   Vicodin [hydrocodone-acetaminophen] Nausea And Vomiting, Swelling, Palpitations      Medication List    TAKE these medications   folic acid 1 MG tablet Commonly known as:  FOLVITE Take 1 tablet (1 mg total) by mouth daily. Start taking on:  December 14, 2018   hydrochlorothiazide 12.5 MG capsule Commonly known as:  MICROZIDE Take 1 capsule (12.5 mg total) by mouth daily.   losartan 25 MG tablet Commonly known as:  COZAAR Take 1 tablet (25 mg total) by mouth daily. Start taking on:  December 14, 2018   metoprolol succinate 25 MG 24 hr tablet Commonly known as:  TOPROL-XL Take 1 tablet (25 mg total) by mouth daily.   nitroGLYCERIN 0.4 MG SL tablet Commonly known as:  NITROSTAT Place 1 tablet (0.4 mg total) under the tongue every 5 (five) minutes as needed for chest pain.   thiamine 100 MG tablet Take 1 tablet (100  mg total) by mouth daily. Start taking on:  December 14, 2018      Follow-up Information    Lyn RecordsSmith, Henry W, MD. Schedule an appointment as soon as possible for a visit in 1 week(s).   Specialty:  Cardiology Contact information: [address] [place] [address] [phone]           Allergies  Allergen Reactions  . Codeine Palpitations  . Vicodin [Hydrocodone-Acetaminophen] Nausea And Vomiting, Swelling and Palpitations    Consultations:  Cardiology   Procedures/Studies: Dg Chest 2 View  Result Date: 12/11/2018 CLINICAL DATA:  Chest pain. EXAM: CHEST - 2 VIEW COMPARISON:  08/01/2018 FINDINGS: Heart size and pulmonary vascularity are normal. No infiltrates or effusions. Minimal linear scarring in the left midzone. CABG. No significant bone abnormality. IMPRESSION: No active cardiopulmonary disease. Electronically Signed   By: Francene BoyersJames  Maxwell M.D.   On: 12/11/2018 13:02       Subjective: Patient seen and examined the bedside this morning.  Remains comfortable.  Hemodynamically stable.  Alert and oriented.  Stable for discharge.  Discharge Exam: Vitals:   12/12/18 2345 12/13/18 0346  BP: (!) 150/67 (!) 155/67  Pulse: 60 (!) 57  Resp:  18  Temp: 98 F (36.7 C) 98 F (36.7 C)  SpO2: 98% 94%   Vitals:   12/12/18 2227 12/12/18 2345 12/13/18 0010 12/13/18 0346  BP: (!) 150/64 (!) 150/67  (!) 155/67  Pulse: 61 60  (!) 57  Resp: 16   18  Temp: 97.9 F (36.6 C) 98 F (36.7 C)  98 F (36.7 C)  TempSrc: Oral     SpO2: 95% 98%  94%  Weight:   105.2 kg   Height:   5\' 10"  (1.778 m)     General: Pt is alert, awake, not in acute distress Cardiovascular: RRR, S1/S2 +, no rubs, no gallops Respiratory: CTA bilaterally, no wheezing, no rhonchi Abdominal: Soft, NT, ND, bowel sounds + Extremities: no edema, no cyanosis    The results of significant diagnostics from this hospitalization (including imaging, microbiology, ancillary and laboratory) are listed below for reference.     Microbiology: No results found for this or any previous visit (from the past 240 hour(s)).   Labs: BNP (last 3 results) No results for input(s): BNP in the last 8760 hours. Basic Metabolic Panel: Recent Labs  Lab 12/11/18 1320 12/11/18 1801 12/13/18 0502  NA 134*  --   136  K 4.0  --  4.1  CL 101  --  102  CO2 22  --  23  GLUCOSE 119*  --  96  BUN 15  --  17  CREATININE 0.84 0.77 0.88  CALCIUM 9.0  --  9.2   Liver Function Tests: No results for input(s): AST, ALT, ALKPHOS, BILITOT, PROT, ALBUMIN in the last 168 hours. No results for input(s): LIPASE, AMYLASE in the last 168 hours. No results for input(s): AMMONIA in the last 168 hours. CBC: Recent Labs  Lab 12/11/18 1320 12/11/18 1801 12/13/18 0502  WBC 5.6 5.5 5.9  HGB 14.2 14.0 14.1  HCT 43.3 42.4 43.3  MCV 93.7 92.4 93.3  PLT 105* 107* 102*   Cardiac Enzymes: Recent Labs  Lab 12/11/18 1801 12/11/18 2131 12/12/18 0009  TROPONINI <0.03 <0.03 <0.03   BNP: Invalid input(s): POCBNP CBG: Recent Labs  Lab 12/11/18 2117 12/13/18 0415  GLUCAP 103* 139*   D-Dimer No results for input(s): DDIMER in the last 72 hours. Hgb A1c No results  for input(s): HGBA1C in the last 72 hours. Lipid Profile No results for input(s): CHOL, HDL, LDLCALC, TRIG, CHOLHDL, LDLDIRECT in the last 72 hours. Thyroid function studies No results for input(s): TSH, T4TOTAL, T3FREE, THYROIDAB in the last 72 hours.  Invalid input(s): FREET3 Anemia work up No results for input(s): VITAMINB12, FOLATE, FERRITIN, TIBC, IRON, RETICCTPCT in the last 72 hours. Urinalysis    Component Value Date/Time   COLORURINE YELLOW 09/24/2015 1730   APPEARANCEUR CLEAR 09/24/2015 1730   LABSPEC 1.020 09/24/2015 1730   PHURINE 7.5 09/24/2015 1730   GLUCOSEU NEGATIVE 09/24/2015 1730   HGBUR NEGATIVE 09/24/2015 1730   BILIRUBINUR NEGATIVE 09/24/2015 1730   BILIRUBINUR neg 12/18/2014 1725   KETONESUR NEGATIVE 09/24/2015 1730   PROTEINUR NEGATIVE 09/24/2015 1730   UROBILINOGEN 2.0 (H) 09/24/2015 1730   NITRITE NEGATIVE 09/24/2015 1730   LEUKOCYTESUR TRACE (A) 09/24/2015 1730   Sepsis Labs Invalid input(s): PROCALCITONIN,  WBC,  LACTICIDVEN Microbiology No results found for this or any previous visit (from the past 240  hour(s)).  Please note: You were cared for by a hospitalist during your hospital stay. Once you are discharged, your primary care physician will handle any further medical issues. Please note that NO REFILLS for any discharge medications will be authorized once you are discharged, as it is imperative that you return to your primary care physician (or establish a relationship with a primary care physician if you do not have one) for your post hospital discharge needs so that they can reassess your need for medications and monitor your lab values.    Time coordinating discharge: 40 minutes  SIGNED:   Burnadette Pop, MD  [facility] 12/13/2018, 10:08 AM Pager [phone]  If 7PM-7AM, please contact night-coverage [email or web address] Password [email or web address]

## 2018-12-13 NOTE — Plan of Care (Signed)
Pt is alert and oriented X4. Got up this morning agitated about bed alarm that went off. He requested that the alarm shouldn't be turned on. He reported that he fell prior to hospitalization because of alcohol and not because he wasn't steady on his feet. He stated that he's refusing it because he's not been drinking since admission. Currently, pt is washing himself up. Will continue to monitor.

## 2018-12-14 NOTE — Consult Note (Signed)
            [facility] CM Primary Care Navigator  12/14/2018  [name] [birth year] [id number]   Attempt to see patient at the bedside to identify possible discharge needs buthe wasalready dischargedhome.  Per MD note, patient presented with complaints of sudden onset of chest pain that does not radiate. EKG did not show any ischemic changes. Troponins were normal. He was also found to be in alcohol withdrawal and his hospital course was also remarkable for hypertensive urgency.  Patient has discharge instruction tofollow-up with cardiology in 1 week.  Primary care provider's office ([facility] at [facility]) was called (spoke with Angie) and notified of patient's discharge, need for post hospital follow-up and transition of care(TOC). Informed of patient's health issues needing close follow- up (mainly hypertensive urgency, alcohol abuse with withdrawal and thrombocytopenia).  Was informed that patient has an appointment scheduled to see Dr. Alvy Bimler on 01/09/2019 but will be able to move his appointment at an earlier date for post hospital follow-up.  Made aware to refer patient to [facility] care management if deemed necessary and appropriate for services.   For additional questions please contact:  Karin Golden A. Amrom Ore, BSN, RN-BC [facility] PRIMARY CARE Navigator Cell: [phone]

## 2018-12-19 DIAGNOSIS — Z79891 Long term (current) use of opiate analgesic: Secondary | ICD-10-CM | POA: Diagnosis not present

## 2018-12-19 DIAGNOSIS — F4310 Post-traumatic stress disorder, unspecified: Secondary | ICD-10-CM | POA: Diagnosis not present

## 2018-12-26 ENCOUNTER — Encounter: Payer: Self-pay | Admitting: Nurse Practitioner

## 2018-12-27 ENCOUNTER — Ambulatory Visit: Payer: Medicare Other | Admitting: Nurse Practitioner

## 2018-12-27 NOTE — Progress Notes (Deleted)
CARDIOLOGY OFFICE NOTE  Date:  12/27/2018    [name] Date of Birth: 10-25-1946 Medical Record #[id number]  PCP:  System, Pcp Not In  Cardiologist:  Tyrone Sage & ***    No chief complaint on file.   History of Present Illness: [name] is a 73 y.o. male who presents today for a *** male with a history of CAD s/p CABG x3, with early graft failure and re-do surgery (2006), HTN, HLD, PTSD, and obesity who presents today with chest pain.    Lakendrick has had spotty follow-up.  Management of risk factors has been poor especially as it pertains to blood pressure and lipids.  He has no cardiac complaints.  He does not have a primary care physician.  He denies exertional leg discomfort and edema.   Comes in today. Here with   Past Medical History:  Diagnosis Date  . Anginal pain (HCC) 01/2005  . Anxiety   . Arthritis    "knees"  . Chronic lower back pain   . Coronary artery disease   . Depression   . Hepatitis    "don't know what kind; they treated me for it; was a long time ago" (12/11/2018)  . High cholesterol   . History of bronchitis    "used to get it alot" (08/25/2012)  . Hypertension   . Panic attacks   . PTSD (post-traumatic stress disorder)    "have been treated in the past" (08/25/2012)    Past Surgical History:  Procedure Laterality Date  . CARDIAC CATHETERIZATION  01/2005  . CORONARY ARTERY BYPASS GRAFT  01/2005   CABG X3  . Shrapnel Left 1969   LLE; left lateral thumb (required grafting)  . VARICOSE VEIN SURGERY  1980's   LLE     Medications: No outpatient medications have been marked as taking for the 12/27/18 encounter (Appointment) with Ronald Macadamia, NP.     Allergies: Allergies  Allergen Reactions  . Codeine Palpitations  . Vicodin [Hydrocodone-Acetaminophen] Nausea And Vomiting, Swelling and Palpitations    Social History: The patient  reports that he has quit smoking. He has a 10.00 pack-year smoking history. He has never  used smokeless tobacco. He reports current alcohol use of about 60.0 standard drinks of alcohol per week. He reports current drug use. Drug: Marijuana.   Family History: The patient's ***family history includes Cancer in his brother; Diabetes in his mother; Heart disease in his father.   Review of Systems: Please see the history of present illness.   Otherwise, the review of systems is positive for {NONE DEFAULTED:18576::"none"}.   All other systems are reviewed and negative.   Physical Exam: VS:  There were no vitals taken for this visit. Marland Kitchen  BMI There is no height or weight on file to calculate BMI.  Wt Readings from Last 3 Encounters:  12/13/18 231 lb 14.8 oz (105.2 kg)  02/07/18 238 lb 6.4 oz (108.1 kg)  07/04/17 230 lb (104.3 kg)    General: Pleasant. Well developed, well nourished and in no acute distress.   HEENT: Normal.  Neck: Supple, no JVD, carotid bruits, or masses noted.  Cardiac: ***Regular rate and rhythm. No murmurs, rubs, or gallops. No edema.  Respiratory:  Lungs are clear to auscultation bilaterally with normal work of breathing.  GI: Soft and nontender.  MS: No deformity or atrophy. Gait and ROM intact.  Skin: Warm and dry. Color is normal.  Neuro:  Strength and sensation are intact and no  gross focal deficits noted.  Psych: Alert, appropriate and with normal affect.   LABORATORY DATA:  EKG:  EKG {ACTION; IS/IS HBZ:[id number]} ordered today. This demonstrates ***.  Lab Results  Component Value Date   WBC 5.9 12/13/2018   HGB 14.1 12/13/2018   HCT 43.3 12/13/2018   PLT 102 (L) 12/13/2018   GLUCOSE 96 12/13/2018   CHOL 251 (H) 08/26/2012   TRIG 282 (H) 08/26/2012   HDL 36 (L) 08/26/2012   LDLCALC 159 (H) 08/26/2012   ALT 155 (H) 11/11/2016   AST 128 (H) 11/11/2016   NA 136 12/13/2018   K 4.1 12/13/2018   CL 102 12/13/2018   CREATININE 0.88 12/13/2018   BUN 17 12/13/2018   CO2 23 12/13/2018   INR 1.08 08/25/2012   HGBA1C 5.9 12/02/2015     BNP  (last 3 results) No results for input(s): BNP in the last 8760 hours.  ProBNP (last 3 results) No results for input(s): PROBNP in the last 8760 hours.   Other Studies Reviewed Today:   Assessment/Plan: 1. Coronary artery disease involving coronary bypass graft of native heart without angina pectoris   2. Essential hypertension   3. Other hyperlipidemia      PLAN:  In order of problems listed above:  1. He seems to be doing quite well from a ischemic standpoint.  Risk factor modification is been a difficult task.  Despite recommending various agents to mitigate risk, he stopped taking the medications in hope of being able to treat his problem without pills. 2. BP target should be 130/80 mmHg.  In addition to low-dose metoprolol succinate will add HCTZ 12.5 mg/day.  Best approach in him would be a combination ACE/diuretic along with beta-blocker therapy.  He will come back in approximately a month for blood pressure recheck.  Lab work in 1 week. 3. Lipids will also be checked at the time of next blood draw.  He has refused to take statins in the past.    Current medicines are reviewed with the patient today.  The patient does not have concerns regarding medicines other than what has been noted above.  The following changes have been made:  See above.  Labs/ tests ordered today include:   No orders of the defined types were placed in this encounter.    Disposition:   FU with *** in {gen number 8-10:175102} {Days to years:10300}.   Patient is agreeable to this plan and will call if any problems develop in the interim.   SignedNorma Fredrickson, NP  12/27/2018 7:22 AM  [facility] [address] [place], [place]  [address] Phone: [phone] Fax: [phone]

## 2019-01-09 ENCOUNTER — Ambulatory Visit: Payer: Medicare Other | Admitting: Emergency Medicine

## 2019-01-24 ENCOUNTER — Ambulatory Visit (INDEPENDENT_AMBULATORY_CARE_PROVIDER_SITE_OTHER): Payer: Medicare Other | Admitting: Emergency Medicine

## 2019-01-24 ENCOUNTER — Other Ambulatory Visit: Payer: Self-pay

## 2019-01-24 ENCOUNTER — Encounter: Payer: Self-pay | Admitting: Emergency Medicine

## 2019-01-24 ENCOUNTER — Ambulatory Visit: Payer: Medicare Other | Admitting: Nurse Practitioner

## 2019-01-24 VITALS — BP 189/74 | HR 78 | Temp 98.2°F | Resp 16 | Ht 70.0 in | Wt 244.0 lb

## 2019-01-24 DIAGNOSIS — D696 Thrombocytopenia, unspecified: Secondary | ICD-10-CM | POA: Diagnosis not present

## 2019-01-24 DIAGNOSIS — R188 Other ascites: Secondary | ICD-10-CM | POA: Diagnosis not present

## 2019-01-24 DIAGNOSIS — R739 Hyperglycemia, unspecified: Secondary | ICD-10-CM

## 2019-01-24 DIAGNOSIS — F1010 Alcohol abuse, uncomplicated: Secondary | ICD-10-CM

## 2019-01-24 DIAGNOSIS — Z8679 Personal history of other diseases of the circulatory system: Secondary | ICD-10-CM | POA: Diagnosis not present

## 2019-01-24 DIAGNOSIS — I10 Essential (primary) hypertension: Secondary | ICD-10-CM

## 2019-01-24 MED ORDER — FOLIC ACID 1 MG PO TABS: 1.0000 mg | ORAL_TABLET | Freq: Every day | ORAL | 3 refills | Status: DC

## 2019-01-24 MED ORDER — LOSARTAN POTASSIUM 25 MG PO TABS: 25.0000 mg | ORAL_TABLET | Freq: Every day | ORAL | 1 refills | Status: DC

## 2019-01-24 MED ORDER — NITROGLYCERIN 0.4 MG SL SUBL: 0.4000 mg | SUBLINGUAL_TABLET | SUBLINGUAL | 3 refills | Status: DC | PRN

## 2019-01-24 MED ORDER — THIAMINE HCL 100 MG PO TABS: 100.0000 mg | ORAL_TABLET | Freq: Every day | ORAL | 3 refills | Status: DC

## 2019-01-24 NOTE — Progress Notes (Signed)
Admit date: 12/11/2018 Discharge date: 12/13/2018  Admitted From: Home Disposition:  Home  Discharge Condition:Stable CODE STATUS:FULL Diet recommendation: Heart Healthy  Brief/Interim Summary: [name] an 73 y.o.malewith medical history significant ofhypertension, chronic low back pain, elevated cholesterol, coronary artery disease, panic attacks and PTSD who presents emergency department complaining of sudden onset of chest pain catered in the substernal area and does not radiate. His chest pain has  resolved after admission.  His chest pain was atypical.  Cardiology was following and did not think that he needs further testing.  EKG did not show any ischemic changes.  Troponins were normal.  He was also found to be in alcohol withdrawal and was started on CIWA protocol.  His hospital course course was also remarkable for hypertensive urgency. This morning he is alert and oriented and comfortable.  Hemodynamically stable. He is stable for discharge to home today.  Following problems were addressed during his hospitalization:  Alcohol abusewith withdrawal -Currently he is alert and oriented x4 -Social worker consulted for providing alcohol rehabilitation resources -Continue thiamine and folic acid -Counseled for alcohol cessation.  He will also follow-up with psychiatry for history of PTSD and alcohol dependence.  Hypertensive urgency with associated ischemic chest pain: -CE negative -seen by cardiology -on BB -losartan added -BP stable now  Coronary artery disease involving coronary bypass graft  -History of CABG with  in 2011. -Stress test obtained in 2016 with results as follows:Low risk -Seen by cardiology:No further testing planned.  Thrombocytopenia Most likely associated with chronic alcohol abuse with possible liver cirrhosis.  Will recommend follow-up with PCP as an outpatient.  Discharge Diagnoses:  Principal Problem:   Hypertensive  urgency Active Problems:   Coronary artery disease involving coronary bypass graft of native heart without angina pectoris   Essential hypertension   Hyperlipidemia   ETOH abuse   Ischemic chest pain (HCC)   Alcohol withdrawal (HCC)  [name] 73 y.o.   Chief Complaint  Patient presents with  . Establish Care  . Chest Pain    hospital follow up at [facility] x 3 days    HISTORY OF PRESENT ILLNESS: This is a 73 y.o. male here for follow-up of hospital stay on 12/11/2018 when he presented with atypical chest pain.  Patient has multiple medical problems as outlined above.  Ran out of losartan, has been taking metoprolol for his blood pressure.  Questionable liver cirrhosis.  Patient is a chronic EtOH user.  Asymptomatic at present time.  Here to establish care with me today.  HPI   Prior to Admission medications   Medication Sig Start Date End Date Taking? Authorizing Provider  ALPRAZolam (XANAX) 0.25 MG tablet Take 1 tablet (0.25 mg total) by mouth 2 (two) times daily as needed for anxiety. 12/13/18  Yes Burnadette Pop, MD  folic acid (FOLVITE) 1 MG tablet Take 1 tablet (1 mg total) by mouth daily. 12/14/18  Yes Burnadette Pop, MD  losartan (COZAAR) 25 MG tablet Take 1 tablet (25 mg total) by mouth daily. 12/14/18  Yes Burnadette Pop, MD  metoprolol succinate (TOPROL-XL) 25 MG 24 hr tablet Take 1 tablet (25 mg total) by mouth daily. 04/25/18  Yes Georgie Chard D, NP  nitroGLYCERIN (NITROSTAT) 0.4 MG SL tablet Place 1 tablet (0.4 mg total) under the tongue every 5 (five) minutes as needed for chest pain. 12/09/15  Yes Dyann Kief, PA-C  thiamine 100 MG tablet Take 1 tablet (100 mg total) by mouth daily. 12/14/18  Yes Adhikari,  Amrit, MD  hydrochlorothiazide (MICROZIDE) 12.5 MG capsule Take 1 capsule (12.5 mg total) by mouth daily. Patient not taking: Reported on 01/24/2019 02/07/18 02/02/19  Lyn RecordsSmith, Henry W, MD    Allergies  Allergen Reactions  . Codeine Palpitations  . Vicodin  [Hydrocodone-Acetaminophen] Nausea And Vomiting, Swelling and Palpitations    Patient Active Problem List   Diagnosis Date Noted  . Alcohol withdrawal (HCC) 12/12/2018  . Hypertensive urgency 12/11/2018  . Ischemic chest pain (HCC) 12/11/2018  . ETOH abuse 12/09/2015  . Left knee pain 12/02/2015  . Coronary artery disease involving coronary bypass graft of native heart without angina pectoris 08/07/2014  . Essential hypertension 08/07/2014  . Hyperlipidemia 08/07/2014    Past Medical History:  Diagnosis Date  . Anginal pain (HCC) 01/2005  . Anxiety   . Arthritis    "knees"  . Chronic lower back pain   . Coronary artery disease   . Depression   . Hepatitis    "don't know what kind; they treated me for it; was a long time ago" (12/11/2018)  . High cholesterol   . History of bronchitis    "used to get it alot" (08/25/2012)  . Hypertension   . Panic attacks   . PTSD (post-traumatic stress disorder)    "have been treated in the past" (08/25/2012)    Past Surgical History:  Procedure Laterality Date  . CARDIAC CATHETERIZATION  01/2005  . CORONARY ARTERY BYPASS GRAFT  01/2005   CABG X3  . Shrapnel Left 1969   LLE; left lateral thumb (required grafting)  . VARICOSE VEIN SURGERY  1980's   LLE    Social History   Socioeconomic History  . Marital status: Divorced    Spouse name: Not on file  . Number of children: Not on file  . Years of education: Not on file  . Highest education level: Not on file  Occupational History  . Not on file  Social Needs  . Financial resource strain: Not on file  . Food insecurity:    Worry: Not on file    Inability: Not on file  . Transportation needs:    Medical: Not on file    Non-medical: Not on file  Tobacco Use  . Smoking status: Former Smoker    Packs/day: 2.00    Years: 5.00    Pack years: 10.00  . Smokeless tobacco: Never Used  . Tobacco comment: 08/25/2012 "quit smoking cigarettes 40 yr ago"  Substance and Sexual Activity  .  Alcohol use: Yes    Alcohol/week: 60.0 standard drinks    Types: 60 Standard drinks or equivalent per week    Comment: 12/11/2018 "~ 1 pint of vodka/day"  . Drug use: Yes    Types: Marijuana    Comment: 08/25/2012 "last marijuana 10 years ago"  . Sexual activity: Yes  Lifestyle  . Physical activity:    Days per week: Not on file    Minutes per session: Not on file  . Stress: Not on file  Relationships  . Social connections:    Talks on phone: Not on file    Gets together: Not on file    Attends religious service: Not on file    Active member of club or organization: Not on file    Attends meetings of clubs or organizations: Not on file    Relationship status: Not on file  . Intimate partner violence:    Fear of current or ex partner: Not on file    Emotionally abused:  Not on file    Physically abused: Not on file    Forced sexual activity: Not on file  Other Topics Concern  . Not on file  Social History Narrative  . Not on file    Family History  Problem Relation Age of Onset  . Diabetes Mother   . Heart disease Father   . Cancer Brother      Review of Systems  Constitutional: Negative.  Negative for chills and fever.  HENT: Negative.  Negative for hearing loss.   Eyes: Negative.  Negative for blurred vision and double vision.  Respiratory: Negative.  Negative for cough and shortness of breath.   Cardiovascular: Positive for leg swelling. Negative for chest pain and palpitations.  Gastrointestinal: Negative for abdominal pain, blood in stool, diarrhea, melena, nausea and vomiting.  Genitourinary: Negative.   Musculoskeletal: Negative.   Skin: Negative.  Negative for rash.  Neurological: Negative.  Negative for dizziness and headaches.  Endo/Heme/Allergies: Negative.   All other systems reviewed and are negative.  Vitals:   01/24/19 1131  BP: (!) 189/74  Pulse: 78  Resp: 16  Temp: 98.2 F (36.8 C)  SpO2: 96%     Physical Exam Constitutional:       Appearance: He is well-developed.  HENT:     Head: Normocephalic and atraumatic.     Mouth/Throat:     Mouth: Mucous membranes are moist.     Pharynx: Oropharynx is clear.  Eyes:     Extraocular Movements: Extraocular movements intact.     Conjunctiva/sclera: Conjunctivae normal.     Pupils: Pupils are equal, round, and reactive to light.  Neck:     Musculoskeletal: Normal range of motion and neck supple.     Vascular: No JVD.  Cardiovascular:     Rate and Rhythm: Normal rate and regular rhythm.     Pulses: Normal pulses.     Heart sounds: Normal heart sounds.  Pulmonary:     Effort: Pulmonary effort is normal.     Breath sounds: Normal breath sounds.  Abdominal:     General: Bowel sounds are normal. There is distension.     Tenderness: There is no abdominal tenderness.     Comments: Positive ascites  Musculoskeletal:     Right lower leg: Edema (Mild pitting edema) present.     Left lower leg: Edema (Mild pitting edema) present.  Skin:    General: Skin is warm and dry.     Capillary Refill: Capillary refill takes less than 2 seconds.  Neurological:     General: No focal deficit present.     Mental Status: He is alert and oriented to person, place, and time.     Comments: No asterixis or findings of hepatic encephalopathy  Psychiatric:        Mood and Affect: Mood normal.        Behavior: Behavior normal.      ASSESSMENT & PLAN: Pape was seen today for establish care and chest pain.  Diagnoses and all orders for this visit:  Essential hypertension -     nitroGLYCERIN (NITROSTAT) 0.4 MG SL tablet; Place 1 tablet (0.4 mg total) under the tongue every 5 (five) minutes as needed for chest pain. -     losartan (COZAAR) 25 MG tablet; Take 1 tablet (25 mg total) by mouth daily. -     CBC with Differential/Platelet -     Comprehensive metabolic panel -     Hemoglobin A1c  Chronic alcohol abuse -  folic acid (FOLVITE) 1 MG tablet; Take 1 tablet (1 mg total) by mouth  daily. -     thiamine 100 MG tablet; Take 1 tablet (100 mg total) by mouth daily.  Other ascites Comments: Suspected liver cirrhosis Orders: -     US Abdomen Complete; Future  History of coronary artery disease  Hyperglycemia -     Hemoglobin A1c  Thrombocytopenia (HCC) -     CBC with Differential/Platelet    Patient Instructions       If you have lab work done today you will be contacted with your lab results within the next 2 weeks.  If you have not heard from [place] then please contact us. The fastest way to get your results is to register for My Chart.   IF you received an x-ray today, you will receive an invoice from [facility]. Please contact [facility] at [phone] with questions or concerns regarding your invoice.   IF you received labwork today, you will receive an invoice from [place]. Please contact [facility] at [phone] with questions or concerns regarding your invoice.   Our billing staff will not be able to assist you with questions regarding bills from these companies.  You will be contacted with the lab results as soon as they are available. The fastest way to get your results is to activate your My Chart account. Instructions are located on the last page of this paperwork. If you have not heard from [place] regarding the results in 2 weeks, please contact this office.     Hypertension Hypertension, commonly called high blood pressure, is when the force of blood pumping through the arteries is too strong. The arteries are the blood vessels that carry blood from the heart throughout the body. Hypertension forces the heart to work harder to pump blood and may cause arteries to become narrow or stiff. Having untreated or uncontrolled hypertension can cause heart attacks, strokes, kidney disease, and other problems. A blood pressure reading consists of a higher number over a lower number. Ideally, your blood pressure should be below 120/80. The  first ("top") number is called the systolic pressure. It is a measure of the pressure in your arteries as your heart beats. The second ("bottom") number is called the diastolic pressure. It is a measure of the pressure in your arteries as the heart relaxes. What are the causes? The cause of this condition is not known. What increases the risk? Some risk factors for high blood pressure are under your control. Others are not. Factors you can change  Smoking.  Having type 2 diabetes mellitus, high cholesterol, or both.  Not getting enough exercise or physical activity.  Being overweight.  Having too much fat, sugar, calories, or salt (sodium) in your diet.  Drinking too much alcohol. Factors that are difficult or impossible to change  Having chronic kidney disease.  Having a family history of high blood pressure.  Age. Risk increases with age.  Race. You may be at higher risk if you are African-American.  Gender. Men are at higher risk than women before age 75. After age 90, women are at higher risk than men.  Having obstructive sleep apnea.  Stress. What are the signs or symptoms? Extremely high blood pressure (hypertensive crisis) may cause:  Headache.  Anxiety.  Shortness of breath.  Nosebleed.  Nausea and vomiting.  Severe chest pain.  Jerky movements you cannot control (seizures). How is this diagnosed? This condition is diagnosed by measuring your blood pressure  while you are seated, with your arm resting on a surface. The cuff of the blood pressure monitor will be placed directly against the skin of your upper arm at the level of your heart. It should be measured at least twice using the same arm. Certain conditions can cause a difference in blood pressure between your right and left arms. Certain factors can cause blood pressure readings to be lower or higher than normal (elevated) for a short period of time:  When your blood pressure is higher when you are  in a health care provider's office than when you are at home, this is called white coat hypertension. Most people with this condition do not need medicines.  When your blood pressure is higher at home than when you are in a health care provider's office, this is called masked hypertension. Most people with this condition may need medicines to control blood pressure. If you have a high blood pressure reading during one visit or you have normal blood pressure with other risk factors:  You may be asked to return on a different day to have your blood pressure checked again.  You may be asked to monitor your blood pressure at home for 1 week or longer. If you are diagnosed with hypertension, you may have other blood or imaging tests to help your health care provider understand your overall risk for other conditions. How is this treated? This condition is treated by making healthy lifestyle changes, such as eating healthy foods, exercising more, and reducing your alcohol intake. Your health care provider may prescribe medicine if lifestyle changes are not enough to get your blood pressure under control, and if:  Your systolic blood pressure is above 130.  Your diastolic blood pressure is above 80. Your personal target blood pressure may vary depending on your medical conditions, your age, and other factors. Follow these instructions at home: Eating and drinking   Eat a diet that is high in fiber and potassium, and low in sodium, added sugar, and fat. An example eating plan is called the DASH (Dietary Approaches to Stop Hypertension) diet. To eat this way: ? Eat plenty of fresh fruits and vegetables. Try to fill half of your plate at each meal with fruits and vegetables. ? Eat whole grains, such as whole wheat pasta, brown rice, or whole grain bread. Fill about one quarter of your plate with whole grains. ? Eat or drink low-fat dairy products, such as skim milk or low-fat yogurt. ? Avoid fatty cuts  of meat, processed or cured meats, and poultry with skin. Fill about one quarter of your plate with lean proteins, such as fish, chicken without skin, beans, eggs, and tofu. ? Avoid premade and processed foods. These tend to be higher in sodium, added sugar, and fat.  Reduce your daily sodium intake. Most people with hypertension should eat less than 1,500 mg of sodium a day.  Limit alcohol intake to no more than 1 drink a day for nonpregnant women and 2 drinks a day for men. One drink equals 12 oz of beer, 5 oz of wine, or 1 oz of hard liquor. Lifestyle   Work with your health care provider to maintain a healthy body weight or to lose weight. Ask what an ideal weight is for you.  Get at least 30 minutes of exercise that causes your heart to beat faster (aerobic exercise) most days of the week. Activities may include walking, swimming, or biking.  Include exercise to strengthen your muscles (  resistance exercise), such as pilates or lifting weights, as part of your weekly exercise routine. Try to do these types of exercises for 30 minutes at least 3 days a week.  Do not use any products that contain nicotine or tobacco, such as cigarettes and e-cigarettes. If you need help quitting, ask your health care provider.  Monitor your blood pressure at home as told by your health care provider.  Keep all follow-up visits as told by your health care provider. This is important. Medicines  Take over-the-counter and prescription medicines only as told by your health care provider. Follow directions carefully. Blood pressure medicines must be taken as prescribed.  Do not skip doses of blood pressure medicine. Doing this puts you at risk for problems and can make the medicine less effective.  Ask your health care provider about side effects or reactions to medicines that you should watch for. Contact a health care provider if:  You think you are having a reaction to a medicine you are taking.  You  have headaches that keep coming back (recurring).  You feel dizzy.  You have swelling in your ankles.  You have trouble with your vision. Get help right away if:  You develop a severe headache or confusion.  You have unusual weakness or numbness.  You feel faint.  You have severe pain in your chest or abdomen.  You vomit repeatedly.  You have trouble breathing. Summary  Hypertension is when the force of blood pumping through your arteries is too strong. If this condition is not controlled, it may put you at risk for serious complications.  Your personal target blood pressure may vary depending on your medical conditions, your age, and other factors. For most people, a normal blood pressure is less than 120/80.  Hypertension is treated with lifestyle changes, medicines, or a combination of both. Lifestyle changes include weight loss, eating a healthy, low-sodium diet, exercising more, and limiting alcohol. This information is not intended to replace advice given to you by your health care provider. Make sure you discuss any questions you have with your health care provider. Document Released: 11/15/2005 Document Revised: 10/13/2016 Document Reviewed: 10/13/2016 Elsevier Interactive Patient Education  2019 [employer]      Edwina Barth, MD [facility]

## 2019-01-24 NOTE — Patient Instructions (Addendum)
   If you have lab work done today you will be contacted with your lab results within the next 2 weeks.  If you have not heard from us then please contact us. The fastest way to get your results is to register for My Chart.   IF you received an x-ray today, you will receive an invoice from [facility]. Please contact [facility] at [phone] with questions or concerns regarding your invoice.   IF you received labwork today, you will receive an invoice from [facility]. Please contact [facility] at [phone] with questions or concerns regarding your invoice.   Our billing staff will not be able to assist you with questions regarding bills from these companies.  You will be contacted with the lab results as soon as they are available. The fastest way to get your results is to activate your My Chart account. Instructions are located on the last page of this paperwork. If you have not heard from us regarding the results in 2 weeks, please contact this office.       Hypertension Hypertension, commonly called high blood pressure, is when the force of blood pumping through the arteries is too strong. The arteries are the blood vessels that carry blood from the heart throughout the body. Hypertension forces the heart to work harder to pump blood and may cause arteries to become narrow or stiff. Having untreated or uncontrolled hypertension can cause heart attacks, strokes, kidney disease, and other problems. A blood pressure reading consists of a higher number over a lower number. Ideally, your blood pressure should be below 120/80. The first ("top") number is called the systolic pressure. It is a measure of the pressure in your arteries as your heart beats. The second ("bottom") number is called the diastolic pressure. It is a measure of the pressure in your arteries as the heart relaxes. What are the causes? The cause of this condition is not known. What increases the  risk? Some risk factors for high blood pressure are under your control. Others are not. Factors you can change  Smoking.  Having type 2 diabetes mellitus, high cholesterol, or both.  Not getting enough exercise or physical activity.  Being overweight.  Having too much fat, sugar, calories, or salt (sodium) in your diet.  Drinking too much alcohol. Factors that are difficult or impossible to change  Having chronic kidney disease.  Having a family history of high blood pressure.  Age. Risk increases with age.  Race. You may be at higher risk if you are African-American.  Gender. Men are at higher risk than women before age 45. After age 65, women are at higher risk than men.  Having obstructive sleep apnea.  Stress. What are the signs or symptoms? Extremely high blood pressure (hypertensive crisis) may cause:  Headache.  Anxiety.  Shortness of breath.  Nosebleed.  Nausea and vomiting.  Severe chest pain.  Jerky movements you cannot control (seizures). How is this diagnosed? This condition is diagnosed by measuring your blood pressure while you are seated, with your arm resting on a surface. The cuff of the blood pressure monitor will be placed directly against the skin of your upper arm at the level of your heart. It should be measured at least twice using the same arm. Certain conditions can cause a difference in blood pressure between your right and left arms. Certain factors can cause blood pressure readings to be lower or higher than normal (elevated) for a short period of time:    When your blood pressure is higher when you are in a health care provider's office than when you are at home, this is called white coat hypertension. Most people with this condition do not need medicines.  When your blood pressure is higher at home than when you are in a health care provider's office, this is called masked hypertension. Most people with this condition may need medicines  to control blood pressure. If you have a high blood pressure reading during one visit or you have normal blood pressure with other risk factors:  You may be asked to return on a different day to have your blood pressure checked again.  You may be asked to monitor your blood pressure at home for 1 week or longer. If you are diagnosed with hypertension, you may have other blood or imaging tests to help your health care provider understand your overall risk for other conditions. How is this treated? This condition is treated by making healthy lifestyle changes, such as eating healthy foods, exercising more, and reducing your alcohol intake. Your health care provider may prescribe medicine if lifestyle changes are not enough to get your blood pressure under control, and if:  Your systolic blood pressure is above 130.  Your diastolic blood pressure is above 80. Your personal target blood pressure may vary depending on your medical conditions, your age, and other factors. Follow these instructions at home: Eating and drinking   Eat a diet that is high in fiber and potassium, and low in sodium, added sugar, and fat. An example eating plan is called the DASH (Dietary Approaches to Stop Hypertension) diet. To eat this way: ? Eat plenty of fresh fruits and vegetables. Try to fill half of your plate at each meal with fruits and vegetables. ? Eat whole grains, such as whole wheat pasta, brown rice, or whole grain bread. Fill about one quarter of your plate with whole grains. ? Eat or drink low-fat dairy products, such as skim milk or low-fat yogurt. ? Avoid fatty cuts of meat, processed or cured meats, and poultry with skin. Fill about one quarter of your plate with lean proteins, such as fish, chicken without skin, beans, eggs, and tofu. ? Avoid premade and processed foods. These tend to be higher in sodium, added sugar, and fat.  Reduce your daily sodium intake. Most people with hypertension should  eat less than 1,500 mg of sodium a day.  Limit alcohol intake to no more than 1 drink a day for nonpregnant women and 2 drinks a day for men. One drink equals 12 oz of beer, 5 oz of wine, or 1 oz of hard liquor. Lifestyle   Work with your health care provider to maintain a healthy body weight or to lose weight. Ask what an ideal weight is for you.  Get at least 30 minutes of exercise that causes your heart to beat faster (aerobic exercise) most days of the week. Activities may include walking, swimming, or biking.  Include exercise to strengthen your muscles (resistance exercise), such as pilates or lifting weights, as part of your weekly exercise routine. Try to do these types of exercises for 30 minutes at least 3 days a week.  Do not use any products that contain nicotine or tobacco, such as cigarettes and e-cigarettes. If you need help quitting, ask your health care provider.  Monitor your blood pressure at home as told by your health care provider.  Keep all follow-up visits as told by your health care provider.   This is important. Medicines  Take over-the-counter and prescription medicines only as told by your health care provider. Follow directions carefully. Blood pressure medicines must be taken as prescribed.  Do not skip doses of blood pressure medicine. Doing this puts you at risk for problems and can make the medicine less effective.  Ask your health care provider about side effects or reactions to medicines that you should watch for. Contact a health care provider if:  You think you are having a reaction to a medicine you are taking.  You have headaches that keep coming back (recurring).  You feel dizzy.  You have swelling in your ankles.  You have trouble with your vision. Get help right away if:  You develop a severe headache or confusion.  You have unusual weakness or numbness.  You feel faint.  You have severe pain in your chest or abdomen.  You vomit  repeatedly.  You have trouble breathing. Summary  Hypertension is when the force of blood pumping through your arteries is too strong. If this condition is not controlled, it may put you at risk for serious complications.  Your personal target blood pressure may vary depending on your medical conditions, your age, and other factors. For most people, a normal blood pressure is less than 120/80.  Hypertension is treated with lifestyle changes, medicines, or a combination of both. Lifestyle changes include weight loss, eating a healthy, low-sodium diet, exercising more, and limiting alcohol. This information is not intended to replace advice given to you by your health care provider. Make sure you discuss any questions you have with your health care provider. Document Released: 11/15/2005 Document Revised: 10/13/2016 Document Reviewed: 10/13/2016 Elsevier Interactive Patient Education  2019 [employer]  

## 2019-01-24 NOTE — Progress Notes (Deleted)
CARDIOLOGY OFFICE NOTE  Date:  01/24/2019    [name] Date of Birth: 10-30-1946 Medical Record #[id number]  PCP:  System, Pcp Not In  Cardiologist:  Tyrone Sage & ***    No chief complaint on file.   History of Present Illness: [name] is a 73 y.o. male who presents today for a ***   history of CAD s/p CABG x3 with early graft failure and re-do surgery in 2006, hypertension, hyperlipidemia, obesity, and PTSD, who is being seen today for the evaluation of chest pain at the request of Dr. Willette Pa.  History of Present Illness    [name] is a 73 year old male with the above history who is followed by Dr. Katrinka Blazing. He has a history of being non-compliant with blood pressure and cholesterol medications and has had inconsistent follow-up. Patient last saw Dr. Katrinka Blazing on 02/07/2018 at which time he denied any cardiac symptoms.   Patient reports he presented to the ED for evaluation of high blood pressure. Patient reports dizziness with associated blurry vision yesterday, so he went to [facility] to check his blood pressure on their automatic machine. Patient reports BP was 214/104 at that time. The Pharmacist at [facility] manually checked his BP and it was reportedly 116/103. The Pharmacist advised the patient to come to the ED immediately which the patient did. Patient also reports some mild substernal chest tightness yesterday. Onset occurred at rest and patient states it only lasted a couple of minutes. Patient had some mild palpitations and "belching with the pain." Pain improved when patient got up and started walking. No associated shortness of breath, diaphoresis, nausea/vomiting. He denies any recent exertional chest pain or shortness of breath. Patient denies any recent fevers or illness. He does report some recent weight loss which he attributes to his alcohol use. Patient currently drinks a fifth or a pint of liquor (mostly Vodka) daily, which he has done for many years. Patient  knows that this is unhealthy and is scheduled to see a psychiatrist for his PTSD/alcohol abuse later this month.   In the ED, patient hypertensive with systolic BP in the 200's at times. EKG showed no acute ischemic changes. I-stat troponin negative. Chest x-ray showed no acute findings. WBC 5.6, Hgb 14.2, Plts 105. Na 134, K 4.0, Glucose 119, SCr 0.84. Per chart review, pain improved with improvement of BP. Patient admitted for further evaluation.  Comes in today. Here with   Past Medical History:  Diagnosis Date  . Anginal pain (HCC) 01/2005  . Anxiety   . Arthritis    "knees"  . Chronic lower back pain   . Coronary artery disease   . Depression   . Hepatitis    "don't know what kind; they treated me for it; was a long time ago" (12/11/2018)  . High cholesterol   . History of bronchitis    "used to get it alot" (08/25/2012)  . Hypertension   . Panic attacks   . PTSD (post-traumatic stress disorder)    "have been treated in the past" (08/25/2012)    Past Surgical History:  Procedure Laterality Date  . CARDIAC CATHETERIZATION  01/2005  . CORONARY ARTERY BYPASS GRAFT  01/2005   CABG X3  . Shrapnel Left 1969   LLE; left lateral thumb (required grafting)  . VARICOSE VEIN SURGERY  1980's   LLE     Medications: No outpatient medications have been marked as taking for the 01/24/19 encounter (Appointment) with Norma Fredrickson  C, NP.     Allergies: Allergies  Allergen Reactions  . Codeine Palpitations  . Vicodin [Hydrocodone-Acetaminophen] Nausea And Vomiting, Swelling and Palpitations    Social History: The patient  reports that he has quit smoking. He has a 10.00 pack-year smoking history. He has never used smokeless tobacco. He reports current alcohol use of about 60.0 standard drinks of alcohol per week. He reports current drug use. Drug: Marijuana.   Family History: The patient's ***family history includes Cancer in his brother; Diabetes in his mother; Heart disease in his  father.   Review of Systems: Please see the history of present illness.   Otherwise, the review of systems is positive for {NONE DEFAULTED:18576::"none"}.   All other systems are reviewed and negative.   Physical Exam: VS:  There were no vitals taken for this visit. Marland Kitchen  BMI There is no height or weight on file to calculate BMI.  Wt Readings from Last 3 Encounters:  01/24/19 244 lb (110.7 kg)  12/13/18 231 lb 14.8 oz (105.2 kg)  02/07/18 238 lb 6.4 oz (108.1 kg)    General: Pleasant. Well developed, well nourished and in no acute distress.   HEENT: Normal.  Neck: Supple, no JVD, carotid bruits, or masses noted.  Cardiac: ***Regular rate and rhythm. No murmurs, rubs, or gallops. No edema.  Respiratory:  Lungs are clear to auscultation bilaterally with normal work of breathing.  GI: Soft and nontender.  MS: No deformity or atrophy. Gait and ROM intact.  Skin: Warm and dry. Color is normal.  Neuro:  Strength and sensation are intact and no gross focal deficits noted.  Psych: Alert, appropriate and with normal affect.   LABORATORY DATA:  EKG:  EKG {ACTION; IS/IS [facility]:68088110} ordered today. This demonstrates ***.  Lab Results  Component Value Date   WBC 5.9 12/13/2018   HGB 14.1 12/13/2018   HCT 43.3 12/13/2018   PLT 102 (L) 12/13/2018   GLUCOSE 96 12/13/2018   CHOL 251 (H) 08/26/2012   TRIG 282 (H) 08/26/2012   HDL 36 (L) 08/26/2012   LDLCALC 159 (H) 08/26/2012   ALT 155 (H) 11/11/2016   AST 128 (H) 11/11/2016   NA 136 12/13/2018   K 4.1 12/13/2018   CL 102 12/13/2018   CREATININE 0.88 12/13/2018   BUN 17 12/13/2018   CO2 23 12/13/2018   INR 1.08 08/25/2012   HGBA1C 5.9 12/02/2015     BNP (last 3 results) No results for input(s): BNP in the last 8760 hours.  ProBNP (last 3 results) No results for input(s): PROBNP in the last 8760 hours.   Other Studies Reviewed Today:   Assessment/Plan: Echocardiogram 02/21/2014: Study Conclusions: - Left ventricle: The  cavity size was normal. Wall thickness was increased in a pattern of moderate LVH. Systolic function was normal. The estimated ejection fraction was in the range of 50% to 55%. Wall motion was normal; there were no regional wall motion abnormalities. Left ventricular diastolic function parameters were normal. - Aortic valve: Trivial regurgitation. - Pulmonary arteries: PA peak pressure: 33mm Hg (S). - Pericardium, extracardiac: A trivial pericardial effusion was identified. Features were not consistent with tamponade physiology.  Assessment & Plan    1. Chest Pain with Known CAD s/p CABG - Patient reports mild chest tightness for a couple of minutes yesterday that improved with activity. - EKG showed no acute ischemic changes. - Troponin negative x3. - Patient currently chest pain free.  - Suspect chest pain secondary to severe hypertension. No further inpatient  work-up necessary at this time.  2. Hypertensive Urgency - Systolic BP in the 200's in the ED. - Improved but still elevated at 152/79 this morning. - Currently on home Toprol and PRN Hydralazine.  - Will start Losartan  daily.   3. Alcohol Use Disorder - Drink a fifth to a pint of liquor daily. - Patient reports he is scheduled to see a psychiatrist for his PTSD/alcohol abuse later this month. - Complete cessation is advised. - Currently on CIWA protocol. - Patient asked about his current liver function. Will defer checking hepatic function to primary team.    Essential hypertension - We will add losartan 25 mg once a day to his regimen.  Encouraged him to follow-up closely with his primary care physician.  Alcohol cessation will also help with his blood pressure.  Coronary artery disease - CABG with redo CABG 2006.  Last stress test low risk.  Troponins currently low risk.  Alcoholism -Continue to encourage cessation.     Current medicines are reviewed with the patient today.  The  patient does not have concerns regarding medicines other than what has been noted above.  The following changes have been made:  See above.  Labs/ tests ordered today include:   No orders of the defined types were placed in this encounter.    Disposition:   FU with *** in {gen number 4-01:027253} {Days to years:10300}.   Patient is agreeable to this plan and will call if any problems develop in the interim.   SignedNorma Fredrickson, NP  01/24/2019 12:07 PM  [facility] [address] [place], [place]  [address] Phone: [phone] Fax: [phone]

## 2019-01-25 ENCOUNTER — Encounter: Payer: Self-pay | Admitting: Nurse Practitioner

## 2019-01-25 LAB — COMPREHENSIVE METABOLIC PANEL
ALK PHOS: 68 IU/L (ref 39–117)
ALT: 67 IU/L — ABNORMAL HIGH (ref 0–44)
AST: 67 IU/L — ABNORMAL HIGH (ref 0–40)
Albumin/Globulin Ratio: 0.8 — ABNORMAL LOW (ref 1.2–2.2)
Albumin: 4 g/dL (ref 3.7–4.7)
BUN/Creatinine Ratio: 16 (ref 10–24)
BUN: 16 mg/dL (ref 8–27)
Bilirubin Total: 0.7 mg/dL (ref 0.0–1.2)
CO2: 19 mmol/L — ABNORMAL LOW (ref 20–29)
Calcium: 9.6 mg/dL (ref 8.6–10.2)
Chloride: 104 mmol/L (ref 96–106)
Creatinine, Ser: 0.99 mg/dL (ref 0.76–1.27)
GFR calc Af Amer: 88 mL/min/{1.73_m2} (ref >59)
GFR calc non Af Amer: 76 mL/min/{1.73_m2} (ref >59)
Globulin, Total: 4.8 g/dL — ABNORMAL HIGH (ref 1.5–4.5)
Glucose: 107 mg/dL — ABNORMAL HIGH (ref 65–99)
Potassium: 4.1 mmol/L (ref 3.5–5.2)
Sodium: 139 mmol/L (ref 134–144)
Total Protein: 8.8 g/dL — ABNORMAL HIGH (ref 6.0–8.5)

## 2019-01-25 LAB — CBC WITH DIFFERENTIAL/PLATELET
Basophils Absolute: 0 10*3/uL (ref 0.0–0.2)
Basos: 1 %
EOS (ABSOLUTE): 0.1 10*3/uL (ref 0.0–0.4)
Eos: 2 %
Hematocrit: 38.9 % (ref 37.5–51.0)
Hemoglobin: 13.2 g/dL (ref 13.0–17.7)
Immature Grans (Abs): 0 10*3/uL (ref 0.0–0.1)
Immature Granulocytes: 0 %
Lymphocytes Absolute: 1.7 10*3/uL (ref 0.7–3.1)
Lymphs: 39 %
MCH: 30.1 pg (ref 26.6–33.0)
MCHC: 33.9 g/dL (ref 31.5–35.7)
MCV: 89 fL (ref 79–97)
Monocytes Absolute: 0.4 10*3/uL (ref 0.1–0.9)
Monocytes: 10 %
Neutrophils Absolute: 2.1 10*3/uL (ref 1.4–7.0)
Neutrophils: 48 %
Platelets: 87 10*3/uL — CL (ref 150–450)
RBC: 4.39 x10E6/uL (ref 4.14–5.80)
RDW: 12.7 % (ref 11.6–15.4)
WBC: 4.3 10*3/uL (ref 3.4–10.8)

## 2019-01-25 LAB — HEMOGLOBIN A1C
Est. average glucose Bld gHb Est-mCnc: 120 mg/dL
Hgb A1c MFr Bld: 5.8 % — ABNORMAL HIGH (ref 4.8–5.6)

## 2019-02-08 ENCOUNTER — Telehealth: Payer: Self-pay | Admitting: Interventional Cardiology

## 2019-02-08 ENCOUNTER — Encounter: Payer: Self-pay | Admitting: Emergency Medicine

## 2019-02-08 ENCOUNTER — Telehealth: Payer: Self-pay | Admitting: Emergency Medicine

## 2019-02-08 NOTE — Telephone Encounter (Signed)
° °  Patient returned call, patient states he had labs done at [facility], scheduler advised patient to contact PCP office to get lab results

## 2019-02-08 NOTE — Telephone Encounter (Signed)
New Message   Pt says he had some tests done and he is needing results  Please call back

## 2019-02-08 NOTE — Telephone Encounter (Signed)
Letter was mailed to pt. Attempted to call pt to let him know. There was no answer.

## 2019-02-08 NOTE — Telephone Encounter (Signed)
Patient called for lab results, results given per notes Dr. Alvy Bimler on 01/25/19, patient verbalized understanding. He asks about the GI referral that was discussed in the office. He asks if his results can be sent to Dr. Horatio Pel at [facility].   Notes recorded by Georgina Quint, MD on 01/25/2019 at 12:21 PM EST Low platelets. Chronic. Mild elevation of liver enzymes, better than before. Most likely secondary to chronic liver disease. Letter please.

## 2019-02-08 NOTE — Telephone Encounter (Addendum)
Attempted to contact pt.  VM states it is full and cannot leave message. Looks like pt recently had labs at PCP office.  Will need to contact that office for results.  Unable to locate any other recent tests.

## 2019-02-08 NOTE — Telephone Encounter (Signed)
This encounter was created in error - please disregard.

## 2019-02-08 NOTE — Telephone Encounter (Signed)
Copied from CRM [id number]. Topic: General - Inquiry >> Feb 08, 2019 12:02 PM Richarda Blade wrote: Reason for CRM: Patient called wanting his lab results

## 2019-02-17 ENCOUNTER — Emergency Department (HOSPITAL_COMMUNITY)
Admission: EM | Admit: 2019-02-17 | Discharge: 2019-02-18 | Disposition: A | Discharge status: Home / Self Care | Payer: Medicare Other | Attending: Emergency Medicine | Admitting: Emergency Medicine

## 2019-02-17 ENCOUNTER — Encounter (HOSPITAL_COMMUNITY): Payer: Self-pay | Admitting: *Deleted

## 2019-02-17 ENCOUNTER — Other Ambulatory Visit: Payer: Self-pay

## 2019-02-17 DIAGNOSIS — W19XXXA Unspecified fall, initial encounter: Secondary | ICD-10-CM | POA: Diagnosis not present

## 2019-02-17 DIAGNOSIS — I2510 Atherosclerotic heart disease of native coronary artery without angina pectoris: Secondary | ICD-10-CM | POA: Insufficient documentation

## 2019-02-17 DIAGNOSIS — R410 Disorientation, unspecified: Secondary | ICD-10-CM | POA: Diagnosis not present

## 2019-02-17 DIAGNOSIS — Y939 Activity, unspecified: Secondary | ICD-10-CM | POA: Insufficient documentation

## 2019-02-17 DIAGNOSIS — I10 Essential (primary) hypertension: Secondary | ICD-10-CM | POA: Diagnosis not present

## 2019-02-17 DIAGNOSIS — S0990XA Unspecified injury of head, initial encounter: Secondary | ICD-10-CM

## 2019-02-17 DIAGNOSIS — F10220 Alcohol dependence with intoxication, uncomplicated: Secondary | ICD-10-CM | POA: Insufficient documentation

## 2019-02-17 DIAGNOSIS — R4182 Altered mental status, unspecified: Secondary | ICD-10-CM | POA: Diagnosis not present

## 2019-02-17 DIAGNOSIS — E7800 Pure hypercholesterolemia, unspecified: Secondary | ICD-10-CM | POA: Insufficient documentation

## 2019-02-17 DIAGNOSIS — F10920 Alcohol use, unspecified with intoxication, uncomplicated: Secondary | ICD-10-CM

## 2019-02-17 DIAGNOSIS — Z87891 Personal history of nicotine dependence: Secondary | ICD-10-CM | POA: Diagnosis not present

## 2019-02-17 DIAGNOSIS — Y929 Unspecified place or not applicable: Secondary | ICD-10-CM | POA: Insufficient documentation

## 2019-02-17 DIAGNOSIS — Y998 Other external cause status: Secondary | ICD-10-CM | POA: Insufficient documentation

## 2019-02-17 DIAGNOSIS — Z79899 Other long term (current) drug therapy: Secondary | ICD-10-CM | POA: Insufficient documentation

## 2019-02-17 DIAGNOSIS — S0003XA Contusion of scalp, initial encounter: Secondary | ICD-10-CM | POA: Diagnosis not present

## 2019-02-17 DIAGNOSIS — R404 Transient alteration of awareness: Secondary | ICD-10-CM | POA: Diagnosis not present

## 2019-02-17 DIAGNOSIS — R001 Bradycardia, unspecified: Secondary | ICD-10-CM | POA: Diagnosis not present

## 2019-02-17 NOTE — ED Provider Notes (Signed)
[name] EMERGENCY DEPARTMENT Provider Note   CSN: [id number] Arrival date & time: 02/17/19  2308     History   Chief Complaint Chief Complaint  Patient presents with  . Fall    HPI [name] is a 73 y.o. male.     Patient is a 73 year old male with past medical history of coronary artery disease with CABG, PTSD, anxiety, and chronic alcoholism.  He was brought this evening by EMS for evaluation of altered mental status.  He apparently had some sort of episode where he fell and struck the back of his head.  He also has a laceration to his tongue and was apparently incontinent of urine.  There was no witnessed seizure activity.  Patient adds little to history secondary to somnolence and apparent intoxication.  The history is provided by the patient.    Past Medical History:  Diagnosis Date  . Anginal pain (HCC) 01/2005  . Anxiety   . Arthritis    "knees"  . Chronic lower back pain   . Coronary artery disease   . Depression   . Hepatitis    "don't know what kind; they treated me for it; was a long time ago" (12/11/2018)  . High cholesterol   . History of bronchitis    "used to get it alot" (08/25/2012)  . Hypertension   . Panic attacks   . PTSD (post-traumatic stress disorder)    "have been treated in the past" (08/25/2012)    Patient Active Problem List   Diagnosis Date Noted  . Alcohol withdrawal (HCC) 12/12/2018  . Hypertensive urgency 12/11/2018  . Ischemic chest pain (HCC) 12/11/2018  . Chronic alcohol abuse 12/09/2015  . Left knee pain 12/02/2015  . Coronary artery disease involving coronary bypass graft of native heart without angina pectoris 08/07/2014  . Essential hypertension 08/07/2014  . Hyperlipidemia 08/07/2014    Past Surgical History:  Procedure Laterality Date  . CARDIAC CATHETERIZATION  01/2005  . CORONARY ARTERY BYPASS GRAFT  01/2005   CABG X3  . Shrapnel Left 1969   LLE; left lateral thumb (required grafting)  .  VARICOSE VEIN SURGERY  1980's   LLE        Home Medications    Prior to Admission medications   Medication Sig Start Date End Date Taking? Authorizing Provider  ALPRAZolam (XANAX) 0.25 MG tablet Take 1 tablet (0.25 mg total) by mouth 2 (two) times daily as needed for anxiety. 12/13/18  Yes Burnadette Pop, MD  folic acid (FOLVITE) 1 MG tablet Take 1 tablet (1 mg total) by mouth daily. 01/24/19  Yes Sagardia, Eilleen Kempf, MD  losartan (COZAAR) 25 MG tablet Take 1 tablet (25 mg total) by mouth daily. 01/24/19 04/24/19 Yes Sagardia, Eilleen Kempf, MD  metoprolol succinate (TOPROL-XL) 25 MG 24 hr tablet Take 1 tablet (25 mg total) by mouth daily. 04/25/18  Yes Georgie Chard D, NP  nitroGLYCERIN (NITROSTAT) 0.4 MG SL tablet Place 1 tablet (0.4 mg total) under the tongue every 5 (five) minutes as needed for chest pain. 01/24/19  Yes Sagardia, Eilleen Kempf, MD  thiamine 100 MG tablet Take 1 tablet (100 mg total) by mouth daily. 01/24/19  Yes Sagardia, Eilleen Kempf, MD  hydrochlorothiazide (MICROZIDE) 12.5 MG capsule Take 1 capsule (12.5 mg total) by mouth daily. Patient not taking: Reported on 02/17/2019 02/07/18 02/17/28  Lyn Records, MD    Family History Family History  Problem Relation Age of Onset  . Diabetes Mother   .  Heart disease Father   . Cancer Brother     Social History Social History   Tobacco Use  . Smoking status: Former Smoker    Packs/day: 2.00    Years: 5.00    Pack years: 10.00  . Smokeless tobacco: Never Used  . Tobacco comment: 08/25/2012 "quit smoking cigarettes 40 yr ago"  Substance Use Topics  . Alcohol use: Yes    Alcohol/week: 60.0 standard drinks    Types: 60 Standard drinks or equivalent per week    Comment: 12/11/2018 "~ 1 pint of vodka/day"  . Drug use: Yes    Types: Marijuana    Comment: 08/25/2012 "last marijuana 10 years ago"     Allergies   Codeine and Vicodin [hydrocodone-acetaminophen]   Review of Systems Review of Systems  Unable to perform  ROS: Other     Physical Exam Updated Vital Signs BP 137/60   Pulse 62   Temp 97.6 F (36.4 C)   Resp 18   Ht  (1.778 m)   Wt 108.9 kg   SpO2 97%   BMI 34.44 kg/m   Physical Exam Vitals signs and nursing note reviewed.  Constitutional:      General: He is not in acute distress.    Appearance: He is well-developed. He is not diaphoretic.  HENT:     Head: Normocephalic and atraumatic.     Mouth/Throat:     Comments: There are bite marks to the front of his tongue. Eyes:     Extraocular Movements: Extraocular movements intact.     Pupils: Pupils are equal, round, and reactive to light.  Neck:     Musculoskeletal: Normal range of motion and neck supple.  Cardiovascular:     Rate and Rhythm: Normal rate and regular rhythm.     Heart sounds: No murmur. No friction rub.  Pulmonary:     Effort: Pulmonary effort is normal. No respiratory distress.     Breath sounds: Normal breath sounds. No wheezing or rales.  Abdominal:     General: Bowel sounds are normal. There is no distension.     Palpations: Abdomen is soft.     Tenderness: There is no abdominal tenderness.  Musculoskeletal: Normal range of motion.  Skin:    General: Skin is warm and dry.  Neurological:     Mental Status: He is alert.     Cranial Nerves: No cranial nerve deficit.     Coordination: Coordination normal.     Comments: Patient is somnolent, but arousable.  He does answer questions appropriately, then nods back off to sleep.  He moves all 4 extremities.      ED Treatments / Results  Labs (all labs ordered are listed, but only abnormal results are displayed) Labs Reviewed  COMPREHENSIVE METABOLIC PANEL  CBC WITH DIFFERENTIAL/PLATELET  ETHANOL    EKG EKG Interpretation  Date/Time:  Saturday February 17 2019 23:21:02 EDT Ventricular Rate:  62 PR Interval:    QRS Duration: 117 QT Interval:  429 QTC Calculation: 436 R Axis:   68 Text Interpretation:  Sinus rhythm Borderline prolonged PR  interval Nonspecific intraventricular conduction delay Confirmed by Geoffery Lyons ([id number]) on 02/17/2019 11:26:56 PM   Radiology No results found.  Procedures Procedures (including critical care time)  Medications Ordered in ED Medications - No data to display   Initial Impression / Assessment and Plan / ED Course  I have reviewed the triage vital signs and the nursing notes.  Pertinent labs & imaging results that were  available during my care of the patient were reviewed by me and considered in my medical decision making (see chart for details).  Patient brought here by EMS after a fall.  He was found beside his car with possible head injury.  He does have bite marks to his tongue and I am told was incontinent of urine.  The patient may possibly have had a seizure, however he has no history of this.  His work-up reveals no obvious abnormality with the exception of a blood alcohol of 311.  Patient was observed overnight during which time he remained stable and rested comfortably.  Patient is now awake, alert, and I believe appropriate for discharge.  He is to follow-up with his primary doctor.  Final Clinical Impressions(s) / ED Diagnoses   Final diagnoses:  None    ED Discharge Orders    None       Geoffery Lyons, MD 02/18/19 [phone]

## 2019-02-17 NOTE — ED Triage Notes (Signed)
The pt arrived by gems from his care where he has fallen ??  After drinking a lot of alcohol pt has swelling to the back of his head   Tongue laceration  And incontinent of urine  c-collar on arrival.  Pt alert oriented skin warm  And dry  Opens eyes to his name and he answers question

## 2019-02-18 ENCOUNTER — Emergency Department (HOSPITAL_COMMUNITY): Payer: Medicare Other

## 2019-02-18 DIAGNOSIS — S0003XA Contusion of scalp, initial encounter: Secondary | ICD-10-CM | POA: Diagnosis not present

## 2019-02-18 DIAGNOSIS — S0990XA Unspecified injury of head, initial encounter: Secondary | ICD-10-CM | POA: Diagnosis not present

## 2019-02-18 LAB — CBC WITH DIFFERENTIAL/PLATELET
Abs Immature Granulocytes: 0.02 10*3/uL (ref 0.00–0.07)
Basophils Absolute: 0 10*3/uL (ref 0.0–0.1)
Basophils Relative: 1 %
Eosinophils Absolute: 0.1 10*3/uL (ref 0.0–0.5)
Eosinophils Relative: 1 %
HCT: 45.9 % (ref 39.0–52.0)
Hemoglobin: 14.5 g/dL (ref 13.0–17.0)
Immature Granulocytes: 0 %
Lymphocytes Relative: 60 %
Lymphs Abs: 3.6 10*3/uL (ref 0.7–4.0)
MCH: 30.1 pg (ref 26.0–34.0)
MCHC: 31.6 g/dL (ref 30.0–36.0)
MCV: 95.4 fL (ref 80.0–100.0)
Monocytes Absolute: 0.4 10*3/uL (ref 0.1–1.0)
Monocytes Relative: 6 %
Neutro Abs: 1.9 10*3/uL (ref 1.7–7.7)
Neutrophils Relative %: 32 %
Platelets: DECREASED 10*3/uL (ref 150–400)
RBC: 4.81 MIL/uL (ref 4.22–5.81)
RDW: 12.9 % (ref 11.5–15.5)
WBC: 5.9 10*3/uL (ref 4.0–10.5)
nRBC: 0 % (ref 0.0–0.2)

## 2019-02-18 LAB — COMPREHENSIVE METABOLIC PANEL
ALT: 126 U/L — ABNORMAL HIGH (ref 0–44)
AST: 131 U/L — ABNORMAL HIGH (ref 15–41)
Albumin: 3.9 g/dL (ref 3.5–5.0)
Alkaline Phosphatase: 61 U/L (ref 38–126)
Anion gap: 15 (ref 5–15)
BUN: 16 mg/dL (ref 8–23)
CALCIUM: 9 mg/dL (ref 8.9–10.3)
CO2: 17 mmol/L — ABNORMAL LOW (ref 22–32)
Chloride: 105 mmol/L (ref 98–111)
Creatinine, Ser: 1.28 mg/dL — ABNORMAL HIGH (ref 0.61–1.24)
GFR calc Af Amer: >60 mL/min (ref >60)
GFR calc non Af Amer: 56 mL/min — ABNORMAL LOW (ref >60)
Glucose, Bld: 111 mg/dL — ABNORMAL HIGH (ref 70–99)
Potassium: 3.8 mmol/L (ref 3.5–5.1)
Sodium: 137 mmol/L (ref 135–145)
Total Bilirubin: 1 mg/dL (ref 0.3–1.2)
Total Protein: 10.2 g/dL — ABNORMAL HIGH (ref 6.5–8.1)

## 2019-02-18 LAB — ETHANOL: Alcohol, Ethyl (B): 311 mg/dL (ref <10)

## 2019-02-18 NOTE — ED Notes (Signed)
To ct

## 2019-02-18 NOTE — ED Notes (Addendum)
Pt  Asleep until he was oved to the hallway  He woke asking for his money  He was told that everytging that was in his pockets is still in there  As soon as he got his pants he removed 700.00 and proceeded to count it in the [employer]	[place] was called to stand by

## 2019-02-18 NOTE — ED Notes (Signed)
c-collar removed per order dr Judd Lien  Sleeping soundly

## 2019-02-18 NOTE — ED Notes (Signed)
Sleeping soundly 

## 2019-02-18 NOTE — ED Notes (Signed)
Pt asleep again  His money in an envelope at his side

## 2019-02-18 NOTE — Discharge Instructions (Addendum)
Follow-up with your primary doctor in the next week, and return to the ER if symptoms significantly worsen or change. °

## 2019-02-22 ENCOUNTER — Encounter (HOSPITAL_COMMUNITY): Payer: Self-pay | Admitting: Emergency Medicine

## 2019-02-22 ENCOUNTER — Emergency Department (HOSPITAL_COMMUNITY): Payer: Medicare Other

## 2019-02-22 ENCOUNTER — Emergency Department (EMERGENCY_DEPARTMENT_HOSPITAL)
Admission: EM | Admit: 2019-02-22 | Discharge: 2019-02-22 | Disposition: A | Discharge status: Home / Self Care | Payer: Medicare Other | Source: Home / Self Care | Attending: Emergency Medicine | Admitting: Emergency Medicine

## 2019-02-22 ENCOUNTER — Other Ambulatory Visit: Payer: Self-pay

## 2019-02-22 DIAGNOSIS — Z833 Family history of diabetes mellitus: Secondary | ICD-10-CM

## 2019-02-22 DIAGNOSIS — E872 Acidosis, unspecified: Secondary | ICD-10-CM

## 2019-02-22 DIAGNOSIS — I2510 Atherosclerotic heart disease of native coronary artery without angina pectoris: Secondary | ICD-10-CM

## 2019-02-22 DIAGNOSIS — I10 Essential (primary) hypertension: Secondary | ICD-10-CM | POA: Diagnosis present

## 2019-02-22 DIAGNOSIS — Z8249 Family history of ischemic heart disease and other diseases of the circulatory system: Secondary | ICD-10-CM

## 2019-02-22 DIAGNOSIS — Z87891 Personal history of nicotine dependence: Secondary | ICD-10-CM

## 2019-02-22 DIAGNOSIS — K7030 Alcoholic cirrhosis of liver without ascites: Secondary | ICD-10-CM | POA: Diagnosis present

## 2019-02-22 DIAGNOSIS — K922 Gastrointestinal hemorrhage, unspecified: Secondary | ICD-10-CM | POA: Diagnosis not present

## 2019-02-22 DIAGNOSIS — Z951 Presence of aortocoronary bypass graft: Secondary | ICD-10-CM | POA: Insufficient documentation

## 2019-02-22 DIAGNOSIS — F10930 Alcohol use, unspecified with withdrawal, uncomplicated: Secondary | ICD-10-CM

## 2019-02-22 DIAGNOSIS — Z20828 Contact with and (suspected) exposure to other viral communicable diseases: Secondary | ICD-10-CM

## 2019-02-22 DIAGNOSIS — K920 Hematemesis: Secondary | ICD-10-CM | POA: Diagnosis not present

## 2019-02-22 DIAGNOSIS — F1290 Cannabis use, unspecified, uncomplicated: Secondary | ICD-10-CM | POA: Diagnosis present

## 2019-02-22 DIAGNOSIS — Z885 Allergy status to narcotic agent status: Secondary | ICD-10-CM

## 2019-02-22 DIAGNOSIS — R0902 Hypoxemia: Secondary | ICD-10-CM | POA: Diagnosis not present

## 2019-02-22 DIAGNOSIS — R05 Cough: Secondary | ICD-10-CM | POA: Diagnosis not present

## 2019-02-22 DIAGNOSIS — Y906 Blood alcohol level of 120-199 mg/100 ml: Secondary | ICD-10-CM

## 2019-02-22 DIAGNOSIS — F329 Major depressive disorder, single episode, unspecified: Secondary | ICD-10-CM | POA: Diagnosis present

## 2019-02-22 DIAGNOSIS — R42 Dizziness and giddiness: Secondary | ICD-10-CM | POA: Diagnosis not present

## 2019-02-22 DIAGNOSIS — G8929 Other chronic pain: Secondary | ICD-10-CM | POA: Diagnosis present

## 2019-02-22 DIAGNOSIS — J069 Acute upper respiratory infection, unspecified: Secondary | ICD-10-CM | POA: Diagnosis present

## 2019-02-22 DIAGNOSIS — Z79899 Other long term (current) drug therapy: Secondary | ICD-10-CM | POA: Insufficient documentation

## 2019-02-22 DIAGNOSIS — E785 Hyperlipidemia, unspecified: Secondary | ICD-10-CM | POA: Diagnosis present

## 2019-02-22 DIAGNOSIS — E7800 Pure hypercholesterolemia, unspecified: Secondary | ICD-10-CM | POA: Diagnosis present

## 2019-02-22 DIAGNOSIS — K921 Melena: Secondary | ICD-10-CM | POA: Diagnosis present

## 2019-02-22 DIAGNOSIS — I25810 Atherosclerosis of coronary artery bypass graft(s) without angina pectoris: Secondary | ICD-10-CM | POA: Diagnosis present

## 2019-02-22 DIAGNOSIS — D696 Thrombocytopenia, unspecified: Secondary | ICD-10-CM | POA: Diagnosis present

## 2019-02-22 DIAGNOSIS — F4310 Post-traumatic stress disorder, unspecified: Secondary | ICD-10-CM | POA: Diagnosis present

## 2019-02-22 DIAGNOSIS — D500 Iron deficiency anemia secondary to blood loss (chronic): Secondary | ICD-10-CM | POA: Diagnosis present

## 2019-02-22 DIAGNOSIS — F10230 Alcohol dependence with withdrawal, uncomplicated: Secondary | ICD-10-CM | POA: Insufficient documentation

## 2019-02-22 DIAGNOSIS — R300 Dysuria: Secondary | ICD-10-CM | POA: Diagnosis not present

## 2019-02-22 DIAGNOSIS — R059 Cough, unspecified: Secondary | ICD-10-CM

## 2019-02-22 DIAGNOSIS — R791 Abnormal coagulation profile: Secondary | ICD-10-CM | POA: Diagnosis present

## 2019-02-22 LAB — COMPREHENSIVE METABOLIC PANEL
ALK PHOS: 56 U/L (ref 38–126)
ALT: 81 U/L — ABNORMAL HIGH (ref 0–44)
AST: 79 U/L — ABNORMAL HIGH (ref 15–41)
Albumin: 3.6 g/dL (ref 3.5–5.0)
Anion gap: 17 — ABNORMAL HIGH (ref 5–15)
BILIRUBIN TOTAL: 1.4 mg/dL — AB (ref 0.3–1.2)
BUN: 15 mg/dL (ref 8–23)
CO2: 21 mmol/L — ABNORMAL LOW (ref 22–32)
Calcium: 8.4 mg/dL — ABNORMAL LOW (ref 8.9–10.3)
Chloride: 97 mmol/L — ABNORMAL LOW (ref 98–111)
Creatinine, Ser: 1.27 mg/dL — ABNORMAL HIGH (ref 0.61–1.24)
GFR calc Af Amer: >60 mL/min (ref >60)
GFR calc non Af Amer: 56 mL/min — ABNORMAL LOW (ref >60)
Glucose, Bld: 123 mg/dL — ABNORMAL HIGH (ref 70–99)
Potassium: 3.7 mmol/L (ref 3.5–5.1)
Sodium: 135 mmol/L (ref 135–145)
TOTAL PROTEIN: 9.2 g/dL — AB (ref 6.5–8.1)

## 2019-02-22 LAB — URINALYSIS, ROUTINE W REFLEX MICROSCOPIC
BACTERIA UA: NONE SEEN
Bilirubin Urine: NEGATIVE
Glucose, UA: NEGATIVE mg/dL
Ketones, ur: 20 mg/dL — AB
LEUKOCYTE UA: NEGATIVE
Nitrite: NEGATIVE
Protein, ur: 100 mg/dL — AB
SPECIFIC GRAVITY, URINE: 1.023 (ref 1.005–1.030)
pH: 5 (ref 5.0–8.0)

## 2019-02-22 LAB — ETHANOL: Alcohol, Ethyl (B): 170 mg/dL — ABNORMAL HIGH (ref <10)

## 2019-02-22 LAB — C-REACTIVE PROTEIN: CRP: <0.8 mg/dL (ref <1.0)

## 2019-02-22 LAB — CBC WITH DIFFERENTIAL/PLATELET
Abs Immature Granulocytes: 0.02 10*3/uL (ref 0.00–0.07)
Basophils Absolute: 0 10*3/uL (ref 0.0–0.1)
Basophils Relative: 0 %
Eosinophils Absolute: 0 10*3/uL (ref 0.0–0.5)
Eosinophils Relative: 1 %
HCT: 40 % (ref 39.0–52.0)
Hemoglobin: 13 g/dL (ref 13.0–17.0)
Immature Granulocytes: 0 %
Lymphocytes Relative: 51 %
Lymphs Abs: 3.5 10*3/uL (ref 0.7–4.0)
MCH: 30.2 pg (ref 26.0–34.0)
MCHC: 32.5 g/dL (ref 30.0–36.0)
MCV: 92.8 fL (ref 80.0–100.0)
Monocytes Absolute: 0.5 10*3/uL (ref 0.1–1.0)
Monocytes Relative: 7 %
NEUTROS ABS: 2.9 10*3/uL (ref 1.7–7.7)
Neutrophils Relative %: 41 %
PLATELETS: 94 10*3/uL — AB (ref 150–400)
RBC: 4.31 MIL/uL (ref 4.22–5.81)
RDW: 13 % (ref 11.5–15.5)
WBC: 7 10*3/uL (ref 4.0–10.5)
nRBC: 0 % (ref 0.0–0.2)

## 2019-02-22 LAB — URINE CULTURE: Culture: NO GROWTH

## 2019-02-22 LAB — LACTIC ACID, PLASMA
Lactic Acid, Venous: 5 mmol/L (ref 0.5–1.9)
Lactic Acid, Venous: 3.4 mmol/L (ref 0.5–1.9)
Lactic Acid, Venous: 3.7 mmol/L (ref 0.5–1.9)

## 2019-02-22 LAB — INFLUENZA PANEL BY PCR (TYPE A & B)
Influenza A By PCR: NEGATIVE
Influenza B By PCR: NEGATIVE

## 2019-02-22 LAB — TROPONIN I: Troponin I: <0.03 ng/mL (ref <0.03)

## 2019-02-22 MED ORDER — CEPHALEXIN 500 MG PO CAPS: 500.0000 mg | ORAL_CAPSULE | Freq: Four times a day (QID) | ORAL | 0 refills | Status: DC

## 2019-02-22 MED ORDER — CHLORDIAZEPOXIDE HCL 25 MG PO CAPS: ORAL_CAPSULE | ORAL | 0 refills | Status: DC

## 2019-02-22 MED ORDER — SODIUM CHLORIDE 0.9 % IV BOLUS (SEPSIS)
1000.0000 mL | Freq: Once | INTRAVENOUS | Status: AC
  Administered 2019-02-22: 1000 mL via INTRAVENOUS

## 2019-02-22 MED ORDER — SODIUM CHLORIDE 0.9 % IV SOLN
2.0000 g | Freq: Once | INTRAVENOUS | Status: AC
  Administered 2019-02-22: 2 g via INTRAVENOUS
  Filled 2019-02-22: qty 2

## 2019-02-22 MED ORDER — VANCOMYCIN HCL 10 G IV SOLR
2000.0000 mg | Freq: Once | INTRAVENOUS | Status: AC
  Administered 2019-02-22: 2000 mg via INTRAVENOUS
  Filled 2019-02-22: qty 2000

## 2019-02-22 MED ORDER — VANCOMYCIN HCL IN DEXTROSE 1-5 GM/200ML-% IV SOLN: 1000.0000 mg | Freq: Once | INTRAVENOUS | Status: DC

## 2019-02-22 MED ORDER — SODIUM CHLORIDE 0.9 % IV BOLUS (SEPSIS)
500.0000 mL | Freq: Once | INTRAVENOUS | Status: AC
  Administered 2019-02-22: 500 mL via INTRAVENOUS

## 2019-02-22 MED ORDER — SODIUM CHLORIDE 0.9 % IV BOLUS
500.0000 mL | Freq: Once | INTRAVENOUS | Status: AC
  Administered 2019-02-22: 500 mL via INTRAVENOUS

## 2019-02-22 MED ORDER — METRONIDAZOLE IN NACL 5-0.79 MG/ML-% IV SOLN
500.0000 mg | Freq: Once | INTRAVENOUS | Status: AC
  Administered 2019-02-22: 500 mg via INTRAVENOUS
  Filled 2019-02-22: qty 100

## 2019-02-22 MED ORDER — CHLORDIAZEPOXIDE HCL 25 MG PO CAPS
25.0000 mg | ORAL_CAPSULE | ORAL | Status: DC | PRN
  Filled 2019-02-22 (×3): qty 10

## 2019-02-22 MED ORDER — DOXYCYCLINE HYCLATE 100 MG PO CAPS: 100.0000 mg | ORAL_CAPSULE | Freq: Two times a day (BID) | ORAL | 0 refills | Status: DC

## 2019-02-22 MED ORDER — CHLORDIAZEPOXIDE HCL 25 MG PO CAPS
25.0000 mg | ORAL_CAPSULE | Freq: Once | ORAL | Status: AC
  Administered 2019-02-22: 25 mg via ORAL
  Filled 2019-02-22: qty 1

## 2019-02-22 MED ORDER — ACETAMINOPHEN 325 MG PO TABS
650.0000 mg | ORAL_TABLET | Freq: Once | ORAL | Status: AC
  Administered 2019-02-22: 650 mg via ORAL
  Filled 2019-02-22: qty 2

## 2019-02-22 NOTE — ED Notes (Signed)
Medications received from Pharmacy and given to pt with instructions on dosage and use. Pt indicated understanding.

## 2019-02-22 NOTE — ED Notes (Signed)
Ordered tray from kitchen.

## 2019-02-22 NOTE — ED Notes (Signed)
ED Provider at bedside. 

## 2019-02-22 NOTE — ED Notes (Signed)
Pt eating. Will ambulate when done with breakfast

## 2019-02-22 NOTE — ED Notes (Signed)
Attempted to ambulate pt. Pt stood up just for a few seconds when he reported he felt weak and needed to sit down. Pt returned to bed and is resting comfortably at this time.

## 2019-02-22 NOTE — ED Notes (Addendum)
Nurse collecting labs. 

## 2019-02-22 NOTE — H&P (Addendum)
HPI  [name] [id number] DOB: 02/03/1946 DOA: 02/22/2019  PCP: Georgina QuintSagardia, [name], MD   Chief Complaint: Confusion, subjective fever 101.536  HPI:  73 year old African-American male Known alcohol habituation CAD with CABG 2011 low risk stress test 20162-day history of nonproductive cough, fever, T-max 101, sepsis work-up initiated in ED sepsis work-up initiated in ED PTSD Bipolar Seen recently in the emergency room 02/17/2019 with vague complaints and apparently struck his head had a tongue laceration-was felt to be intoxicated at the time-was found to be beside his car with possible head injury at that time his blood alcohol level was 311 and he was discharged to home  Return to emergency room 2-day history of nonproductive cough, fever, T-max 101, sepsis work-up initiated in ED-found to have lactic acid of 5.0-sepsis related IV fluids given at 30 mg/kg in ED lactic acid 5-->3.4 influenza negative Documented possible covid exposure per ED  His CRP returned at 0.8 not in keeping with acute infectious etiology or concern for Covid-however I have sent off a respiratory viral panel      ED Course: As above  He tells me he went home from the emergency room on 3/21 and has not really been taking his meds appropriately the last time he drank was last night and then he started to feel weak fatigued and felt like he was having a cough He is not having any occult chest pain he endorses one episode of dark stool he is not having any vomiting he is thirsty by his own admission and wants to drink He is able to get up and ambulate but does feel a little dizzy at this time  I checked his temperature with presence of nursing and was 100.0 in the room   Review of Systems:   Negative for fever, visual changes, sore throat, rash, new muscle aches, chest pain, SOB, dysuria, bleeding, n/v/abdominal pain.  Past Medical History:  Diagnosis Date  . Anginal pain (HCC) 01/2005  . Anxiety    . Arthritis    "knees"  . Chronic lower back pain   . Coronary artery disease   . Depression   . Hepatitis    "don't know what kind; they treated me for it; was a long time ago" (12/11/2018)  . High cholesterol   . History of bronchitis    "used to get it alot" (08/25/2012)  . Hypertension   . Panic attacks   . PTSD (post-traumatic stress disorder)    "have been treated in the past" (08/25/2012)    Past Surgical History:  Procedure Laterality Date  . CARDIAC CATHETERIZATION  01/2005  . CORONARY ARTERY BYPASS GRAFT  01/2005   CABG X3  . Shrapnel Left 1969   LLE; left lateral thumb (required grafting)  . VARICOSE VEIN SURGERY  1980's   LLE     reports that he has quit smoking. He has a 10.00 pack-year smoking history. He has never used smokeless tobacco. He reports current alcohol use of about 60.0 standard drinks of alcohol per week. He reports current drug use. Drug: Marijuana. Mobility: At baseline functional aware to move around and awake enough Lives alone at home Does not use assistive device  Allergies  Allergen Reactions  . Codeine Palpitations  . Vicodin [Hydrocodone-Acetaminophen] Nausea And Vomiting, Swelling and Palpitations    Family History  Problem Relation Age of Onset  . Diabetes Mother   . Heart disease Father   . Cancer Brother      Prior to  Admission medications   Medication Sig Start Date End Date Taking? Authorizing Provider  ALPRAZolam (XANAX) 0.25 MG tablet Take 1 tablet (0.25 mg total) by mouth 2 (two) times daily as needed for anxiety. 12/13/18   Burnadette Pop, MD  folic acid (FOLVITE) 1 MG tablet Take 1 tablet (1 mg total) by mouth daily. 01/24/19   Georgina Quint, MD  hydrochlorothiazide (MICROZIDE) 12.5 MG capsule Take 1 capsule (12.5 mg total) by mouth daily. Patient not taking: Reported on 02/17/2019 02/07/18 02/17/28  Lyn Records, MD  losartan (COZAAR) 25 MG tablet Take 1 tablet (25 mg total) by mouth daily. 01/24/19 04/24/19   Georgina Quint, MD  metoprolol succinate (TOPROL-XL) 25 MG 24 hr tablet Take 1 tablet (25 mg total) by mouth daily. 04/25/18   Filbert Schilder, NP  nitroGLYCERIN (NITROSTAT) 0.4 MG SL tablet Place 1 tablet (0.4 mg total) under the tongue every 5 (five) minutes as needed for chest pain. 01/24/19   Georgina Quint, MD  thiamine 100 MG tablet Take 1 tablet (100 mg total) by mouth daily. 01/24/19   Georgina Quint, MD    Physical Exam:  Vitals:   02/22/19 1886 02/22/19 0913  BP: (!) 138/55   Pulse: 74   Resp: 18   Temp:  98.1 F (36.7 C)  SpO2: 92%      Awake alert coherent no distress, EOMI NCAT, pupils equally reactive to light  No icterus no pallor  Submandibular lymphadenopathy negative  No wheeze no crackles no rales no rhonchi  S1-S2 no murmur rub or gallop  Abdomen soft nontender no rebound no guarding  Trace lower extremity edema  Neurologically intact was able to stand at the bedside but felt somewhat dizzy  I have personally reviewed following labs and imaging studies  Data   Labs in admission = BUN/creatinine 15/1.2 up from a baseline of 16/0.99 01/24/2019, LFTs improved from prior LFTs 321 131/126-79/81 Bilirubin 1.4 Blood ethanol level 170 POC troponin 0 0.03 CO2 up from 17-21 Chest x-ray no acute cardiopulmonary abnormality  C-reactive protein is pending  EKG independently reviewed: PR 0.04, QRS 60, ST-depr not sig changed from 3/21 EKG  Test discussed with performing physician:  I have discussed with Dr. Madilyn Hook of the emergency room   Active Problems:   * No active hospital problems. *   Assessment/Plan Suspicious case of Covif however patient has no new oxygen requirement I have sent off a respiratory viral panel in addition to the covert added onto the flu test which is negative I have advised the patient clearly that he will need to take Tylenol, self isolate, cut down on drinking, and if something changes he needs to return to  the hospital At the time that I saw him he was asking to drink fluids asking for diet and was coherent alert on my having an oxygen requirement My feeling given his presentation is that he also has a component of alcohol withdrawal and I have counseled him that the next time he drinks, as he did on 3/21 2020, he could fall hit his head have a seizure and hurt himself.    I explained this clearly in the presence of his nurse  In addition I also clearly explained to the patient that if we had a specific treatment for this and that if he was sick enough to warrant admission I would have done so-at this juncture unless something changes in the next 8 to 12 hours he does not meet  those criteria  He does not require in hospital stay and can be discharged from the emergency room once he is more stable on his feet  Thank you for this consult  Time spent: 70 minutes  Gloristine Turrubiates, MD  [facility] Direct contact: [phone] --Via amion app OR  --[email or web address]; password [email or web address]  7PM-7AM contact night coverage as above  02/22/2019, 9:31 AM

## 2019-02-22 NOTE — ED Notes (Signed)
Pt ambulated around room with 2 person stand and assist. Pt shuffled steps slowly endorsing weakness and requesting a walker for home. Pt does not have access to a ride home and will need a cab voucher. Pt requesting medicine to manage withdrawal from alcohol.  CIWA 12. Last drink reported yesterday around 0145

## 2019-02-22 NOTE — Progress Notes (Signed)
CSW consulted as staff unsure what net step sin care are for pt. CSW spoke with admitting MD and was informed that he no longer feels that pt is in need of admission. Per MD pt is suspected COVID-19 and also has a history of ETOH use. MD asked that CSW provide pt with resources so that pt doesn't withdraw while at home. CSW was advised by MD that pt IS NOT IN NEED of placement but is needing to be at home as there are no further medical needs at this time.   CSW spoke with MD Pecola Leisure in ED and was advised that she still doesn't feel that pt is safe to be discharge home and needs admission. CSW was advised that MD in ED would follow up with other MD for further needs of pt. CSW will continue to follow and provide pt with resources as instructed at this time.      Claude Manges Seleste Tallman, MSW, LCSW-A Emergency Department Clinical Social Worker [phone]

## 2019-02-22 NOTE — ED Provider Notes (Signed)
[facility] EMERGENCY DEPARTMENT Provider Note   CSN: [id number] Arrival date & time: 02/22/19  0205    History   Chief Complaint Chief Complaint  Patient presents with  . Cough  . Fever  . Generalized Body Aches    HPI [name] is a 73 y.o. male.     Patient with a history of CAD, HLD, HTN, PTSD, presents for evaluation of cough for the past 3-4 days. He denies significant nasal congestion or sore throat, no SOB, chest pain, nausea, vomiting or diarrhea. No known sick contacts - at work until 5 days ago, in the ED on 02/17/19 as only public exposures. No recent travel. He called EMS tonight for transportation to the ED and, per EMS, was found to have a temperature of 101. The patient was not aware he had any fever. He reports burning with urination as well.   The history is provided by the patient. No language interpreter was used.  Cough  Associated symptoms: fever   Associated symptoms: no chest pain, no myalgias, no rash, no shortness of breath and no sore throat   Fever  Associated symptoms: cough and dysuria   Associated symptoms: no chest pain, no congestion, no diarrhea, no myalgias, no nausea, no rash, no sore throat and no vomiting     Past Medical History:  Diagnosis Date  . Anginal pain (HCC) 01/2005  . Anxiety   . Arthritis    "knees"  . Chronic lower back pain   . Coronary artery disease   . Depression   . Hepatitis    "don't know what kind; they treated me for it; was a long time ago" (12/11/2018)  . High cholesterol   . History of bronchitis    "used to get it alot" (08/25/2012)  . Hypertension   . Panic attacks   . PTSD (post-traumatic stress disorder)    "have been treated in the past" (08/25/2012)    Patient Active Problem List   Diagnosis Date Noted  . Alcohol withdrawal (HCC) 12/12/2018  . Hypertensive urgency 12/11/2018  . Ischemic chest pain (HCC) 12/11/2018  . Chronic alcohol abuse 12/09/2015  . Left knee pain  12/02/2015  . Coronary artery disease involving coronary bypass graft of native heart without angina pectoris 08/07/2014  . Essential hypertension 08/07/2014  . Hyperlipidemia 08/07/2014    Past Surgical History:  Procedure Laterality Date  . CARDIAC CATHETERIZATION  01/2005  . CORONARY ARTERY BYPASS GRAFT  01/2005   CABG X3  . Shrapnel Left 1969   LLE; left lateral thumb (required grafting)  . VARICOSE VEIN SURGERY  1980's   LLE        Home Medications    Prior to Admission medications   Medication Sig Start Date End Date Taking? Authorizing Provider  ALPRAZolam (XANAX) 0.25 MG tablet Take 1 tablet (0.25 mg total) by mouth 2 (two) times daily as needed for anxiety. 12/13/18   Burnadette PopAdhikari, Amrit, MD  folic acid (FOLVITE) 1 MG tablet Take 1 tablet (1 mg total) by mouth daily. 01/24/19   Georgina QuintSagardia, [name], MD  hydrochlorothiazide (MICROZIDE) 12.5 MG capsule Take 1 capsule (12.5 mg total) by mouth daily. Patient not taking: Reported on 02/17/2019 02/07/18 02/17/28  Lyn RecordsSmith, Henry W, MD  losartan (COZAAR) 25 MG tablet Take 1 tablet (25 mg total) by mouth daily. 01/24/19 04/24/19  Georgina QuintSagardia, [name], MD  metoprolol succinate (TOPROL-XL) 25 MG 24 hr tablet Take 1 tablet (25 mg total) by mouth daily. 04/25/18  Georgie Chard D, NP  nitroGLYCERIN (NITROSTAT) 0.4 MG SL tablet Place 1 tablet (0.4 mg total) under the tongue every 5 (five) minutes as needed for chest pain. 01/24/19   Georgina Quint, MD  thiamine 100 MG tablet Take 1 tablet (100 mg total) by mouth daily. 01/24/19   Georgina Quint, MD    Family History Family History  Problem Relation Age of Onset  . Diabetes Mother   . Heart disease Father   . Cancer Brother     Social History Social History   Tobacco Use  . Smoking status: Former Smoker    Packs/day: 2.00    Years: 5.00    Pack years: 10.00  . Smokeless tobacco: Never Used  . Tobacco comment: 08/25/2012 "quit smoking cigarettes 40 yr ago"  Substance Use  Topics  . Alcohol use: Yes    Alcohol/week: 60.0 standard drinks    Types: 60 Standard drinks or equivalent per week    Comment: 12/11/2018 "~ 1 pint of vodka/day"  . Drug use: Yes    Types: Marijuana    Comment: 08/25/2012 "last marijuana 10 years ago"     Allergies   Codeine and Vicodin [hydrocodone-acetaminophen]   Review of Systems Review of Systems  Constitutional: Positive for appetite change and fever.  HENT: Negative.  Negative for congestion and sore throat.   Respiratory: Positive for cough. Negative for shortness of breath.   Cardiovascular: Negative for chest pain.  Gastrointestinal: Negative for abdominal pain, diarrhea, nausea and vomiting.  Genitourinary: Positive for dysuria.  Musculoskeletal: Negative for myalgias.  Skin: Negative for rash.  Neurological: Positive for weakness (Generalized).     Physical Exam Updated Vital Signs BP (!) 155/79 (BP Location: Right Arm)   Pulse 84   Temp 98.3 F (36.8 C) (Rectal)   Resp 19   Ht 5\' 10"  (1.778 m)   Wt 108.9 kg   SpO2 94%   BMI 34.44 kg/m   Physical Exam Vitals signs and nursing note reviewed.  Constitutional:      General: He is not in acute distress.    Appearance: He is well-developed. He is not ill-appearing.  HENT:     Head: Normocephalic.     Nose: Nose normal.     Mouth/Throat:     Mouth: Mucous membranes are moist.  Eyes:     Conjunctiva/sclera: Conjunctivae normal.  Neck:     Musculoskeletal: Normal range of motion and neck supple.  Cardiovascular:     Rate and Rhythm: Normal rate and regular rhythm.     Heart sounds: No murmur.  Pulmonary:     Effort: Pulmonary effort is normal.     Breath sounds: Normal breath sounds. No wheezing, rhonchi or rales.  Abdominal:     General: Bowel sounds are normal.     Palpations: Abdomen is soft.     Tenderness: There is no abdominal tenderness. There is no guarding or rebound.  Musculoskeletal: Normal range of motion.  Skin:    General: Skin is  warm and dry.     Findings: No rash.  Neurological:     Mental Status: He is alert and oriented to person, place, and time.      ED Treatments / Results  Labs (all labs ordered are listed, but only abnormal results are displayed) Labs Reviewed  URINE CULTURE  CULTURE, BLOOD (ROUTINE X 2)  CULTURE, BLOOD (ROUTINE X 2)  CBC WITH DIFFERENTIAL/PLATELET  LACTIC ACID, PLASMA  LACTIC ACID, PLASMA  COMPREHENSIVE METABOLIC PANEL  URINALYSIS, ROUTINE W REFLEX MICROSCOPIC  INFLUENZA PANEL BY PCR (TYPE A & B)    EKG None  Radiology No results found.  Procedures Procedures (including critical care time) CRITICAL CARE Performed by: Arnoldo Hooker   Total critical care time: 40 minutes  Critical care time was exclusive of separately billable procedures and treating other patients.  Critical care was necessary to treat or prevent imminent or life-threatening deterioration.  Critical care was time spent personally by me on the following activities: development of treatment plan with patient and/or surrogate as well as nursing, discussions with consultants, evaluation of patient's response to treatment, examination of patient, obtaining history from patient or surrogate, ordering and performing treatments and interventions, ordering and review of laboratory studies, ordering and review of radiographic studies, pulse oximetry and re-evaluation of patient's condition.  Medications Ordered in ED Medications - No data to display   Initial Impression / Assessment and Plan / ED Course  I have reviewed the triage vital signs and the nursing notes.  Pertinent labs & imaging results that were available during my care of the patient were reviewed by me and considered in my medical decision making (see chart for details).        Patient to ED with cough for several days, found to have fever tonight by EMS, reporting 101. No chest pain, SOB, vomiting or diarrhea. Sepsis work up started.    VSS in ED. No hypoxia, tachycardia, tachypnea. He does not appear in any distress. Afebrile on arrival here. However, lactic acid elevated at 5.0. IVF's started at sepsis rate (30 mg/kg). Antibiotics for unknown source started. Will continue to follow.   Patient remains afebrile. Has been mildly tachypneic without hypoxia. No active cough.   Lactic acid improved but still elevated on repeat at 3.4. No source of infection identified in this patient. Consider COVID as possible. Negative influenza tests. Further testing to be at the discretion of admitting team.  Patient updated on results and plan for admission.   Final Clinical Impressions(s) / ED Diagnoses   Final diagnoses:  None   1. Lactic acidosis 2. Cough  ED Discharge Orders    None       Elpidio Anis, Cordelia Poche 02/22/19 [id number]    Dione Booze, MD 02/22/19 [phone]

## 2019-02-22 NOTE — ED Provider Notes (Signed)
7:05 PM Assumed care from multiple previous providers, please see their note for full history, physical and decision making until this point. In brief this is a 73 y.o. year old male who presented to the ED tonight with Cough; Fever; and Generalized Body Aches     Plan was for admission since he came with a reported fever, cough and generalized weakness.  Patient started withdrawing while in the emergency room and has been evaluated by internal medicine, Dr. Mahala Menghini, multiple times who does not think he needs to be admitted and refuses to admit him to the hospital.  Patient is lactic acid was elevated and did not clear however Dr. Mahala Menghini still did not think he needed to be admitted.  He was contacted by social work, case management and daytime ED provider to no avail.  Patient was given fluids and started on Librium for his alcohol withdrawal.  He was got a walker by case management, meds given to him for discharge and a taxi voucher provided to get him home where he lives by himself.  Care will be also offered to him at home.  Patient will return any time for reevaluation.  Discharge instructions, including strict return precautions for new or worsening symptoms, given. Patient and/or family verbalized understanding and agreement with the plan as described.   Labs, studies and imaging reviewed by myself and considered in medical decision making if ordered. Imaging interpreted by radiology.  Labs Reviewed  CBC WITH DIFFERENTIAL/PLATELET - Abnormal; Notable for the following components:      Result Value   Platelets 94 (*)    All other components within normal limits  LACTIC ACID, PLASMA - Abnormal; Notable for the following components:   Lactic Acid, Venous 5.0 (*)    All other components within normal limits  LACTIC ACID, PLASMA - Abnormal; Notable for the following components:   Lactic Acid, Venous 3.4 (*)    All other components within normal limits  COMPREHENSIVE METABOLIC PANEL - Abnormal;  Notable for the following components:   Chloride 97 (*)    CO2 21 (*)    Glucose, Bld 123 (*)    Creatinine, Ser 1.27 (*)    Calcium 8.4 (*)    Total Protein 9.2 (*)    AST 79 (*)    ALT 81 (*)    Total Bilirubin 1.4 (*)    GFR calc non Af Amer 56 (*)    Anion gap 17 (*)    All other components within normal limits  URINALYSIS, ROUTINE W REFLEX MICROSCOPIC - Abnormal; Notable for the following components:   Color, Urine AMBER (*)    Hgb urine dipstick SMALL (*)    Ketones, ur 20 (*)    Protein, ur 100 (*)    All other components within normal limits  ETHANOL - Abnormal; Notable for the following components:   Alcohol, Ethyl (B) 170 (*)    All other components within normal limits  LACTIC ACID, PLASMA - Abnormal; Notable for the following components:   Lactic Acid, Venous 3.7 (*)    All other components within normal limits  URINE CULTURE  CULTURE, BLOOD (ROUTINE X 2)  CULTURE, BLOOD (ROUTINE X 2)  RESPIRATORY PANEL BY PCR  INFLUENZA PANEL BY PCR (TYPE A & B)  TROPONIN I  C-REACTIVE PROTEIN    DG Chest Portable 1 View  Final Result      No follow-ups on file.    Sharnae Winfree, Barbara Cower, MD 02/26/19 [phone]

## 2019-02-22 NOTE — ED Triage Notes (Signed)
BIB EMS from home. Pt c/o cough, body aches, racing heart, and painful urination for 4 days. EMS reports temporal fever of 101.72F, and given 92mL liquid tylenol. +ETOH tonight. Pt poor historian.

## 2019-02-23 ENCOUNTER — Inpatient Hospital Stay (HOSPITAL_COMMUNITY)
Admission: EM | Admit: 2019-02-23 | Discharge: 2019-02-26 | DRG: 378 | Disposition: A | Discharge status: Home / Self Care | Payer: Medicare Other | Attending: Internal Medicine | Admitting: Internal Medicine

## 2019-02-23 DIAGNOSIS — K920 Hematemesis: Secondary | ICD-10-CM | POA: Diagnosis not present

## 2019-02-23 DIAGNOSIS — F1010 Alcohol abuse, uncomplicated: Secondary | ICD-10-CM | POA: Diagnosis present

## 2019-02-23 DIAGNOSIS — K922 Gastrointestinal hemorrhage, unspecified: Secondary | ICD-10-CM | POA: Diagnosis present

## 2019-02-23 DIAGNOSIS — R1111 Vomiting without nausea: Secondary | ICD-10-CM | POA: Diagnosis not present

## 2019-02-23 DIAGNOSIS — R7989 Other specified abnormal findings of blood chemistry: Secondary | ICD-10-CM | POA: Diagnosis present

## 2019-02-23 DIAGNOSIS — D696 Thrombocytopenia, unspecified: Secondary | ICD-10-CM | POA: Diagnosis present

## 2019-02-23 DIAGNOSIS — I10 Essential (primary) hypertension: Secondary | ICD-10-CM | POA: Diagnosis present

## 2019-02-23 DIAGNOSIS — R05 Cough: Secondary | ICD-10-CM | POA: Diagnosis not present

## 2019-02-23 DIAGNOSIS — J069 Acute upper respiratory infection, unspecified: Secondary | ICD-10-CM | POA: Diagnosis present

## 2019-02-23 DIAGNOSIS — R945 Abnormal results of liver function studies: Secondary | ICD-10-CM | POA: Diagnosis present

## 2019-02-23 DIAGNOSIS — I25810 Atherosclerosis of coronary artery bypass graft(s) without angina pectoris: Secondary | ICD-10-CM | POA: Diagnosis present

## 2019-02-24 ENCOUNTER — Other Ambulatory Visit: Payer: Self-pay

## 2019-02-24 ENCOUNTER — Emergency Department (HOSPITAL_COMMUNITY): Payer: Medicare Other

## 2019-02-24 ENCOUNTER — Encounter (HOSPITAL_COMMUNITY): Payer: Self-pay | Admitting: Emergency Medicine

## 2019-02-24 DIAGNOSIS — D500 Iron deficiency anemia secondary to blood loss (chronic): Secondary | ICD-10-CM | POA: Diagnosis present

## 2019-02-24 DIAGNOSIS — Z87891 Personal history of nicotine dependence: Secondary | ICD-10-CM | POA: Diagnosis not present

## 2019-02-24 DIAGNOSIS — F1290 Cannabis use, unspecified, uncomplicated: Secondary | ICD-10-CM | POA: Diagnosis present

## 2019-02-24 DIAGNOSIS — J22 Unspecified acute lower respiratory infection: Secondary | ICD-10-CM | POA: Diagnosis not present

## 2019-02-24 DIAGNOSIS — E785 Hyperlipidemia, unspecified: Secondary | ICD-10-CM | POA: Diagnosis present

## 2019-02-24 DIAGNOSIS — Z885 Allergy status to narcotic agent status: Secondary | ICD-10-CM

## 2019-02-24 DIAGNOSIS — K920 Hematemesis: Secondary | ICD-10-CM | POA: Diagnosis not present

## 2019-02-24 DIAGNOSIS — Z7289 Other problems related to lifestyle: Secondary | ICD-10-CM | POA: Diagnosis not present

## 2019-02-24 DIAGNOSIS — E7800 Pure hypercholesterolemia, unspecified: Secondary | ICD-10-CM | POA: Diagnosis present

## 2019-02-24 DIAGNOSIS — K922 Gastrointestinal hemorrhage, unspecified: Secondary | ICD-10-CM | POA: Diagnosis not present

## 2019-02-24 DIAGNOSIS — I25810 Atherosclerosis of coronary artery bypass graft(s) without angina pectoris: Secondary | ICD-10-CM | POA: Diagnosis present

## 2019-02-24 DIAGNOSIS — I10 Essential (primary) hypertension: Secondary | ICD-10-CM

## 2019-02-24 DIAGNOSIS — R748 Abnormal levels of other serum enzymes: Secondary | ICD-10-CM | POA: Diagnosis not present

## 2019-02-24 DIAGNOSIS — R7989 Other specified abnormal findings of blood chemistry: Secondary | ICD-10-CM | POA: Diagnosis not present

## 2019-02-24 DIAGNOSIS — I2510 Atherosclerotic heart disease of native coronary artery without angina pectoris: Secondary | ICD-10-CM

## 2019-02-24 DIAGNOSIS — D696 Thrombocytopenia, unspecified: Secondary | ICD-10-CM | POA: Diagnosis present

## 2019-02-24 DIAGNOSIS — Z79899 Other long term (current) drug therapy: Secondary | ICD-10-CM

## 2019-02-24 DIAGNOSIS — Z833 Family history of diabetes mellitus: Secondary | ICD-10-CM | POA: Diagnosis not present

## 2019-02-24 DIAGNOSIS — F4310 Post-traumatic stress disorder, unspecified: Secondary | ICD-10-CM | POA: Diagnosis present

## 2019-02-24 DIAGNOSIS — R509 Fever, unspecified: Secondary | ICD-10-CM

## 2019-02-24 DIAGNOSIS — R791 Abnormal coagulation profile: Secondary | ICD-10-CM | POA: Diagnosis present

## 2019-02-24 DIAGNOSIS — R05 Cough: Secondary | ICD-10-CM

## 2019-02-24 DIAGNOSIS — Z8249 Family history of ischemic heart disease and other diseases of the circulatory system: Secondary | ICD-10-CM | POA: Diagnosis not present

## 2019-02-24 DIAGNOSIS — F1010 Alcohol abuse, uncomplicated: Secondary | ICD-10-CM

## 2019-02-24 DIAGNOSIS — J069 Acute upper respiratory infection, unspecified: Secondary | ICD-10-CM | POA: Diagnosis present

## 2019-02-24 DIAGNOSIS — E872 Acidosis: Secondary | ICD-10-CM | POA: Diagnosis present

## 2019-02-24 DIAGNOSIS — R17 Unspecified jaundice: Secondary | ICD-10-CM

## 2019-02-24 DIAGNOSIS — Z03818 Encounter for observation for suspected exposure to other biological agents ruled out: Secondary | ICD-10-CM | POA: Diagnosis not present

## 2019-02-24 DIAGNOSIS — Z951 Presence of aortocoronary bypass graft: Secondary | ICD-10-CM

## 2019-02-24 DIAGNOSIS — F329 Major depressive disorder, single episode, unspecified: Secondary | ICD-10-CM | POA: Diagnosis present

## 2019-02-24 DIAGNOSIS — R945 Abnormal results of liver function studies: Secondary | ICD-10-CM | POA: Diagnosis present

## 2019-02-24 DIAGNOSIS — K921 Melena: Secondary | ICD-10-CM | POA: Diagnosis present

## 2019-02-24 DIAGNOSIS — G8929 Other chronic pain: Secondary | ICD-10-CM | POA: Diagnosis present

## 2019-02-24 DIAGNOSIS — K7030 Alcoholic cirrhosis of liver without ascites: Secondary | ICD-10-CM | POA: Diagnosis present

## 2019-02-24 LAB — RESPIRATORY PANEL BY PCR
Adenovirus: NOT DETECTED
Bordetella pertussis: NOT DETECTED
Chlamydophila pneumoniae: NOT DETECTED
Coronavirus 229E: NOT DETECTED
Coronavirus HKU1: NOT DETECTED
Coronavirus NL63: NOT DETECTED
Coronavirus OC43: NOT DETECTED
INFLUENZA A-RVPPCR: NOT DETECTED
INFLUENZA B-RVPPCR: NOT DETECTED
Metapneumovirus: NOT DETECTED
Mycoplasma pneumoniae: NOT DETECTED
PARAINFLUENZA VIRUS 4-RVPPCR: NOT DETECTED
Parainfluenza Virus 1: NOT DETECTED
Parainfluenza Virus 2: NOT DETECTED
Parainfluenza Virus 3: NOT DETECTED
Respiratory Syncytial Virus: NOT DETECTED
Rhinovirus / Enterovirus: NOT DETECTED

## 2019-02-24 LAB — COMPREHENSIVE METABOLIC PANEL
ALT: 66 U/L — ABNORMAL HIGH (ref 0–44)
ALT: 64 U/L — ABNORMAL HIGH (ref 0–44)
AST: 98 U/L — ABNORMAL HIGH (ref 15–41)
AST: 90 U/L — ABNORMAL HIGH (ref 15–41)
Albumin: 3.2 g/dL — ABNORMAL LOW (ref 3.5–5.0)
Albumin: 3 g/dL — ABNORMAL LOW (ref 3.5–5.0)
Alkaline Phosphatase: 50 U/L (ref 38–126)
Alkaline Phosphatase: 46 U/L (ref 38–126)
Anion gap: 10 (ref 5–15)
Anion gap: 14 (ref 5–15)
BUN: 11 mg/dL (ref 8–23)
BUN: 9 mg/dL (ref 8–23)
CHLORIDE: 106 mmol/L (ref 98–111)
CO2: 21 mmol/L — ABNORMAL LOW (ref 22–32)
CO2: 18 mmol/L — ABNORMAL LOW (ref 22–32)
Calcium: 8.2 mg/dL — ABNORMAL LOW (ref 8.9–10.3)
Calcium: 7.6 mg/dL — ABNORMAL LOW (ref 8.9–10.3)
Chloride: 106 mmol/L (ref 98–111)
Creatinine, Ser: 1.08 mg/dL (ref 0.61–1.24)
Creatinine, Ser: 1.11 mg/dL (ref 0.61–1.24)
GFR calc Af Amer: >60 mL/min (ref >60)
GFR calc Af Amer: >60 mL/min (ref >60)
GFR calc non Af Amer: >60 mL/min (ref >60)
GFR calc non Af Amer: >60 mL/min (ref >60)
Glucose, Bld: 115 mg/dL — ABNORMAL HIGH (ref 70–99)
Glucose, Bld: 127 mg/dL — ABNORMAL HIGH (ref 70–99)
Potassium: 3.6 mmol/L (ref 3.5–5.1)
Potassium: 4 mmol/L (ref 3.5–5.1)
SODIUM: 137 mmol/L (ref 135–145)
Sodium: 138 mmol/L (ref 135–145)
Total Bilirubin: 1.5 mg/dL — ABNORMAL HIGH (ref 0.3–1.2)
Total Bilirubin: 1.8 mg/dL — ABNORMAL HIGH (ref 0.3–1.2)
Total Protein: 7.8 g/dL (ref 6.5–8.1)
Total Protein: 7.4 g/dL (ref 6.5–8.1)

## 2019-02-24 LAB — CBC WITH DIFFERENTIAL/PLATELET
Abs Immature Granulocytes: 0.01 10*3/uL (ref 0.00–0.07)
Basophils Absolute: 0 10*3/uL (ref 0.0–0.1)
Basophils Relative: 1 %
Eosinophils Absolute: 0.1 10*3/uL (ref 0.0–0.5)
Eosinophils Relative: 2 %
HCT: 36.6 % — ABNORMAL LOW (ref 39.0–52.0)
Hemoglobin: 12.2 g/dL — ABNORMAL LOW (ref 13.0–17.0)
Immature Granulocytes: 0 %
Lymphocytes Relative: 31 %
Lymphs Abs: 1.4 10*3/uL (ref 0.7–4.0)
MCH: 31.5 pg (ref 26.0–34.0)
MCHC: 33.3 g/dL (ref 30.0–36.0)
MCV: 94.6 fL (ref 80.0–100.0)
Monocytes Absolute: 0.4 10*3/uL (ref 0.1–1.0)
Monocytes Relative: 10 %
NRBC: 0 % (ref 0.0–0.2)
Neutro Abs: 2.4 10*3/uL (ref 1.7–7.7)
Neutrophils Relative %: 56 %
Platelets: 72 10*3/uL — ABNORMAL LOW (ref 150–400)
RBC: 3.87 MIL/uL — AB (ref 4.22–5.81)
RDW: 12.8 % (ref 11.5–15.5)
WBC: 4.3 10*3/uL (ref 4.0–10.5)

## 2019-02-24 LAB — PROTIME-INR
INR: 1.3 — ABNORMAL HIGH (ref 0.8–1.2)
Prothrombin Time: 16.5 seconds — ABNORMAL HIGH (ref 11.4–15.2)

## 2019-02-24 LAB — CBC
HCT: 34.7 % — ABNORMAL LOW (ref 39.0–52.0)
HEMOGLOBIN: 11.4 g/dL — AB (ref 13.0–17.0)
MCH: 31.1 pg (ref 26.0–34.0)
MCHC: 32.9 g/dL (ref 30.0–36.0)
MCV: 94.8 fL (ref 80.0–100.0)
Platelets: 59 10*3/uL — ABNORMAL LOW (ref 150–400)
RBC: 3.66 MIL/uL — ABNORMAL LOW (ref 4.22–5.81)
RDW: 12.9 % (ref 11.5–15.5)
WBC: 4.5 10*3/uL (ref 4.0–10.5)
nRBC: 0 % (ref 0.0–0.2)

## 2019-02-24 LAB — ETHANOL

## 2019-02-24 LAB — TYPE AND SCREEN
ABO/RH(D): A POS
Antibody Screen: NEGATIVE

## 2019-02-24 LAB — ABO/RH: ABO/RH(D): A POS

## 2019-02-24 MED ORDER — LORAZEPAM 2 MG/ML IJ SOLN
1.0000 mg | Freq: Four times a day (QID) | INTRAMUSCULAR | Status: DC | PRN
  Administered 2019-02-24 (×3): 1 mg via INTRAVENOUS
  Filled 2019-02-24 (×3): qty 1

## 2019-02-24 MED ORDER — SODIUM CHLORIDE 0.9 % IV SOLN
INTRAVENOUS | Status: DC
  Administered 2019-02-24: 02:00:00 via INTRAVENOUS

## 2019-02-24 MED ORDER — OCTREOTIDE LOAD VIA INFUSION
25.0000 ug | Freq: Once | INTRAVENOUS | Status: AC
  Administered 2019-02-24: 25 ug via INTRAVENOUS
  Filled 2019-02-24: qty 13

## 2019-02-24 MED ORDER — ONDANSETRON HCL 4 MG PO TABS: 4.0000 mg | ORAL_TABLET | Freq: Four times a day (QID) | ORAL | Status: DC | PRN

## 2019-02-24 MED ORDER — SODIUM CHLORIDE 0.9% FLUSH
3.0000 mL | Freq: Two times a day (BID) | INTRAVENOUS | Status: DC
  Administered 2019-02-24 – 2019-02-25 (×4): 3 mL via INTRAVENOUS

## 2019-02-24 MED ORDER — ONDANSETRON HCL 4 MG/2ML IJ SOLN: 4.0000 mg | Freq: Four times a day (QID) | INTRAMUSCULAR | Status: DC | PRN

## 2019-02-24 MED ORDER — SODIUM CHLORIDE 0.9 % IV SOLN
8.0000 mg/h | INTRAVENOUS | Status: DC
  Administered 2019-02-24 – 2019-02-25 (×4): 8 mg/h via INTRAVENOUS
  Filled 2019-02-24 (×5): qty 80

## 2019-02-24 MED ORDER — LORAZEPAM 2 MG/ML IJ SOLN: 0.0000 mg | Freq: Two times a day (BID) | INTRAMUSCULAR | Status: DC

## 2019-02-24 MED ORDER — SODIUM CHLORIDE 0.9 % IV SOLN
80.0000 mg | Freq: Once | INTRAVENOUS | Status: AC
  Administered 2019-02-24: 80 mg via INTRAVENOUS
  Filled 2019-02-24: qty 80

## 2019-02-24 MED ORDER — LORAZEPAM 2 MG/ML IJ SOLN: 0.0000 mg | Freq: Four times a day (QID) | INTRAMUSCULAR | Status: DC

## 2019-02-24 MED ORDER — LORAZEPAM 1 MG PO TABS
1.0000 mg | ORAL_TABLET | Freq: Four times a day (QID) | ORAL | Status: DC | PRN
  Administered 2019-02-25 – 2019-02-26 (×3): 1 mg via ORAL
  Filled 2019-02-24 (×4): qty 1

## 2019-02-24 MED ORDER — IOHEXOL 300 MG/ML  SOLN
100.0000 mL | Freq: Once | INTRAMUSCULAR | Status: AC | PRN
  Administered 2019-02-24: 100 mL via INTRAVENOUS

## 2019-02-24 MED ORDER — SODIUM CHLORIDE 0.9 % IV SOLN
50.0000 ug/h | INTRAVENOUS | Status: DC
  Administered 2019-02-24 – 2019-02-25 (×4): 50 ug/h via INTRAVENOUS
  Filled 2019-02-24 (×5): qty 1

## 2019-02-24 MED ORDER — SODIUM CHLORIDE 0.9 % IV BOLUS
1000.0000 mL | Freq: Once | INTRAVENOUS | Status: AC
  Administered 2019-02-24: 1000 mL via INTRAVENOUS

## 2019-02-24 NOTE — ED Triage Notes (Signed)
Here via [facility], brtought in for  GI bleeding bright red blood in vomit. Pt c/o abdominal pain 8 of 10.

## 2019-02-24 NOTE — ED Provider Notes (Signed)
[facility] EMERGENCY DEPARTMENT Provider Note   CSN: [id number] Arrival date & time: 02/23/19  2358    History   Chief Complaint Chief Complaint  Patient presents with   GI Bleeding    HPI [name] is a 73 y.o. male.     The history is provided by the patient and the EMS personnel.  Emesis  Severity:  Moderate Timing:  Intermittent Quality:  Bright red blood and coffee grounds Progression:  Unchanged Chronicity:  New Recent urination:  Normal Context: not post-tussive   Relieved by:  Nothing Worsened by:  Nothing Ineffective treatments:  None tried Associated symptoms: abdominal pain   Associated symptoms: no arthralgias, no chills, no cough, no diarrhea, no fever, no headaches, no myalgias and no sore throat   Risk factors: alcohol use   Risk factors: no suspect food intake and no travel to endemic areas   Patient with h/o alcohol abuse presents with bright red bloody vomitus and some coffee ground emesis per EMS.  No f/c/r.  No travel.  Seen for body aches yesterday.    Past Medical History:  Diagnosis Date   Anginal pain (HCC) 01/2005   Anxiety    Arthritis    "knees"   Chronic lower back pain    Coronary artery disease    Depression    Hepatitis    "don't know what kind; they treated me for it; was a long time ago" (12/11/2018)   High cholesterol    History of bronchitis    "used to get it alot" (08/25/2012)   Hypertension    Panic attacks    PTSD (post-traumatic stress disorder)    "have been treated in the past" (08/25/2012)    Patient Active Problem List   Diagnosis Date Noted   Alcohol withdrawal (HCC) 12/12/2018   Hypertensive urgency 12/11/2018   Ischemic chest pain (HCC) 12/11/2018   Chronic alcohol abuse 12/09/2015   Left knee pain 12/02/2015   Coronary artery disease involving coronary bypass graft of native heart without angina pectoris 08/07/2014   Essential hypertension 08/07/2014    Hyperlipidemia 08/07/2014    Past Surgical History:  Procedure Laterality Date   CARDIAC CATHETERIZATION  01/2005   CORONARY ARTERY BYPASS GRAFT  01/2005   CABG X3   Shrapnel Left 1969   LLE; left lateral thumb (required grafting)   VARICOSE VEIN SURGERY  1980's   LLE        Home Medications    Prior to Admission medications   Medication Sig Start Date End Date Taking? Authorizing Provider  cephALEXin (KEFLEX) 500 MG capsule Take 1 capsule (500 mg total) by mouth 4 (four) times daily. 02/22/19   Mesner, Barbara Cower, MD  chlordiazePOXIDE (LIBRIUM) 25 MG capsule 50mg  PO TID x 2D, then 25-50mg  PO BID X 2D, then 25-50mg  PO QD X 1D 02/22/19   Mesner, Barbara Cower, MD  doxycycline (VIBRAMYCIN) 100 MG capsule Take 1 capsule (100 mg total) by mouth 2 (two) times daily. One po bid x 7 days 02/22/19   Mesner, Barbara Cower, MD  FLUoxetine (PROZAC) 20 MG capsule Take 20 mg by mouth daily.    [provider]  folic acid (FOLVITE) 1 MG tablet Take 1 tablet (1 mg total) by mouth daily. 01/24/19   Georgina Quint, MD  losartan (COZAAR) 25 MG tablet Take 1 tablet (25 mg total) by mouth daily. 01/24/19 04/24/19  Georgina Quint, MD  metoprolol succinate (TOPROL-XL) 25 MG 24 hr tablet Take 1 tablet (25  mg total) by mouth daily. 04/25/18   Filbert Schilder, NP  nitroGLYCERIN (NITROSTAT) 0.4 MG SL tablet Place 1 tablet (0.4 mg total) under the tongue every 5 (five) minutes as needed for chest pain. 01/24/19   Georgina Quint, MD  thiamine 100 MG tablet Take 1 tablet (100 mg total) by mouth daily. 01/24/19   Georgina Quint, MD    Family History Family History  Problem Relation Age of Onset   Diabetes Mother    Heart disease Father    Cancer Brother     Social History Social History   Tobacco Use   Smoking status: Former Smoker    Packs/day: 2.00    Years: 5.00    Pack years: 10.00   Smokeless tobacco: Never Used   Tobacco comment: 08/25/2012 "quit smoking cigarettes 40 yr ago"    Substance Use Topics   Alcohol use: Yes    Alcohol/week: 60.0 standard drinks    Types: 60 Standard drinks or equivalent per week    Comment: 12/11/2018 "~ 1 pint of vodka/day"   Drug use: Yes    Types: Marijuana    Comment: 08/25/2012 "last marijuana 10 years ago"     Allergies   Codeine and Vicodin [hydrocodone-acetaminophen]   Review of Systems Review of Systems  Constitutional: Negative for chills, fatigue and fever.  HENT: Negative for sore throat, trouble swallowing and voice change.   Eyes: Negative for photophobia.  Respiratory: Negative for cough and shortness of breath.   Cardiovascular: Negative for chest pain, palpitations and leg swelling.  Gastrointestinal: Positive for abdominal pain, nausea and vomiting. Negative for diarrhea.  Musculoskeletal: Negative for arthralgias and myalgias.  Neurological: Negative for headaches.  All other systems reviewed and are negative.    Physical Exam Updated Vital Signs BP (!) 175/76    Resp 13    Ht  (1.778 m)    Wt 108.9 kg    BMI 34.45 kg/m   Physical Exam Vitals signs and nursing note reviewed.  Constitutional:      General: He is not in acute distress.    Appearance: He is obese.  HENT:     Head: Normocephalic and atraumatic.     Nose: Nose normal.  Eyes:     Conjunctiva/sclera: Conjunctivae normal.     Pupils: Pupils are equal, round, and reactive to light.  Neck:     Musculoskeletal: Normal range of motion and neck supple.  Cardiovascular:     Rate and Rhythm: Normal rate and regular rhythm.     Pulses: Normal pulses.     Heart sounds: Normal heart sounds.  Pulmonary:     Effort: Pulmonary effort is normal. No respiratory distress.     Breath sounds: Normal breath sounds. No stridor. No wheezing, rhonchi or rales.  Chest:     Chest wall: No tenderness.  Abdominal:     General: Abdomen is flat. Bowel sounds are normal.     Tenderness: There is no abdominal tenderness. There is no guarding or  rebound.     Comments: Blood on abdomen and feet   Musculoskeletal: Normal range of motion.  Skin:    General: Skin is warm and dry.     Capillary Refill: Capillary refill takes less than 2 seconds.  Neurological:     General: No focal deficit present.     Mental Status: He is alert and oriented to person, place, and time.     Deep Tendon Reflexes: Reflexes normal.  Psychiatric:  Mood and Affect: Mood normal.        Behavior: Behavior normal.      ED Treatments / Results  Labs (all labs ordered are listed, but only abnormal results are displayed) Results for orders placed or performed during the hospital encounter of 02/23/19  CBC with Differential/Platelet  Result Value Ref Range   WBC 4.3 4.0 - 10.5 K/uL   RBC 3.87 (L) 4.22 - 5.81 MIL/uL   Hemoglobin 12.2 (L) 13.0 - 17.0 g/dL   HCT 16.1 (L) 09.6 - 04.5 %   MCV 94.6 80.0 - 100.0 fL   MCH 31.5 26.0 - 34.0 pg   MCHC 33.3 30.0 - 36.0 g/dL   RDW 40.9 81.1 - 91.4 %   Platelets 72 (L) 150 - 400 K/uL   nRBC 0.0 0.0 - 0.2 %   Neutrophils Relative % 56 %   Neutro Abs 2.4 1.7 - 7.7 K/uL   Lymphocytes Relative 31 %   Lymphs Abs 1.4 0.7 - 4.0 K/uL   Monocytes Relative 10 %   Monocytes Absolute 0.4 0.1 - 1.0 K/uL   Eosinophils Relative 2 %   Eosinophils Absolute 0.1 0.0 - 0.5 K/uL   Basophils Relative 1 %   Basophils Absolute 0.0 0.0 - 0.1 K/uL   Immature Granulocytes 0 %   Abs Immature Granulocytes 0.01 0.00 - 0.07 K/uL  Comprehensive metabolic panel  Result Value Ref Range   Sodium 137 135 - 145 mmol/L   Potassium 3.6 3.5 - 5.1 mmol/L   Chloride 106 98 - 111 mmol/L   CO2 21 (L) 22 - 32 mmol/L   Glucose, Bld 115 (H) 70 - 99 mg/dL   BUN 9 8 - 23 mg/dL   Creatinine, Ser 7.82 0.61 - 1.24 mg/dL   Calcium 8.2 (L) 8.9 - 10.3 mg/dL   Total Protein 7.8 6.5 - 8.1 g/dL   Albumin 3.2 (L) 3.5 - 5.0 g/dL   AST 98 (H) 15 - 41 U/L   ALT 66 (H) 0 - 44 U/L   Alkaline Phosphatase 50 38 - 126 U/L   Total Bilirubin 1.5 (H) 0.3 -  1.2 mg/dL   GFR calc non Af Amer >60 >60 mL/min   GFR calc Af Amer >60 >60 mL/min   Anion gap 10 5 - 15  Protime-INR  Result Value Ref Range   Prothrombin Time 16.5 (H) 11.4 - 15.2 seconds   INR 1.3 (H) 0.8 - 1.2  Ethanol  Result Value Ref Range   Alcohol, Ethyl (B) <10 <10 mg/dL  Type and screen  Result Value Ref Range   ABO/RH(D) A POS    Antibody Screen NEG    Sample Expiration      02/27/2019 Performed at [facility], [address], [place], [place] [address]   ABO/Rh  Result Value Ref Range   ABO/RH(D)      A POS Performed at [facility], [address] [place], [place] [address]    Ct Head Wo Contrast  Result Date: 02/18/2019 CLINICAL DATA:  Poly trauma, fall after drinking a lot of alcohol, swelling to back of head, history hypertension, coronary artery disease EXAM: CT HEAD WITHOUT CONTRAST CT CERVICAL SPINE WITHOUT CONTRAST TECHNIQUE: Multidetector CT imaging of the head and cervical spine was performed following the standard protocol without intravenous contrast. Multiplanar CT image reconstructions of the cervical spine were also generated. COMPARISON:  CT head 08/01/2018, CT cervical spine 02/17/2014 FINDINGS: CT HEAD FINDINGS Brain: Minimal atrophy. Normal  ventricular morphology. No midline shift or mass effect. Small vessel chronic ischemic changes of deep cerebral white matter. Asymmetric positioning gantry. No no definite extra-axial fluid collections. No intraparenchymal hemorrhage, mass lesion, or evidence of acute infarction. Vascular: Atherosclerotic calcifications of internal carotid and RIGHT vertebral arteries at skull base Skull: Skull intact.  Posterior scalp hematoma. Sinuses/Orbits: Clear Other: N/A CT CERVICAL SPINE FINDINGS Alignment: Normal Skull base and vertebrae: Scattered multilevel facet degenerative changes. Minimal scattered disc space narrowing greatest at C4-C5 with minimal endplate spurring. Vertebral body heights maintained.  Visualized skull base intact. No fracture, subluxation or bone destruction. Soft tissues and spinal canal: Prevertebral soft tissues normal thickness. Cervical soft tissues otherwise unremarkable. Carotid arteries extend retropharyngeal bilaterally and demonstrate atherosclerotic calcification. Disc levels:  Minimally bulging disc at C4-C5. Upper chest: Lung apices clear. Atherosclerotic calcification aortic arch. Other: N/A IMPRESSION: Atrophy with small vessel chronic ischemic changes of deep cerebral white matter. No acute intracranial abnormalities. Degenerative disc and facet disease changes of the cervical spine as above. No acute cervical spine abnormalities. Electronically Signed   By: Ulyses Southward M.D.   On: 02/18/2019 00:46   Ct Cervical Spine Wo Contrast  Result Date: 02/18/2019 CLINICAL DATA:  Poly trauma, fall after drinking a lot of alcohol, swelling to back of head, history hypertension, coronary artery disease EXAM: CT HEAD WITHOUT CONTRAST CT CERVICAL SPINE WITHOUT CONTRAST TECHNIQUE: Multidetector CT imaging of the head and cervical spine was performed following the standard protocol without intravenous contrast. Multiplanar CT image reconstructions of the cervical spine were also generated. COMPARISON:  CT head 08/01/2018, CT cervical spine 02/17/2014 FINDINGS: CT HEAD FINDINGS Brain: Minimal atrophy. Normal ventricular morphology. No midline shift or mass effect. Small vessel chronic ischemic changes of deep cerebral white matter. Asymmetric positioning gantry. No no definite extra-axial fluid collections. No intraparenchymal hemorrhage, mass lesion, or evidence of acute infarction. Vascular: Atherosclerotic calcifications of internal carotid and RIGHT vertebral arteries at skull base Skull: Skull intact.  Posterior scalp hematoma. Sinuses/Orbits: Clear Other: N/A CT CERVICAL SPINE FINDINGS Alignment: Normal Skull base and vertebrae: Scattered multilevel facet degenerative changes. Minimal  scattered disc space narrowing greatest at C4-C5 with minimal endplate spurring. Vertebral body heights maintained. Visualized skull base intact. No fracture, subluxation or bone destruction. Soft tissues and spinal canal: Prevertebral soft tissues normal thickness. Cervical soft tissues otherwise unremarkable. Carotid arteries extend retropharyngeal bilaterally and demonstrate atherosclerotic calcification. Disc levels:  Minimally bulging disc at C4-C5. Upper chest: Lung apices clear. Atherosclerotic calcification aortic arch. Other: N/A IMPRESSION: Atrophy with small vessel chronic ischemic changes of deep cerebral white matter. No acute intracranial abnormalities. Degenerative disc and facet disease changes of the cervical spine as above. No acute cervical spine abnormalities. Electronically Signed   By: Ulyses Southward M.D.   On: 02/18/2019 00:46   Ct Abdomen Pelvis W Contrast  Result Date: 02/24/2019 CLINICAL DATA:  GI bleed, dark stools EXAM: CT ABDOMEN AND PELVIS WITH CONTRAST TECHNIQUE: Multidetector CT imaging of the abdomen and pelvis was performed using the standard protocol following bolus administration of intravenous contrast. CONTRAST:  OMNIPAQUE IOHEXOL 300 MG/ML  SOLN COMPARISON:  02/17/2014 FINDINGS: Lower chest: Bibasilar scarring or atelectasis. Hepatobiliary: No focal liver abnormality is seen. Small gallstones. No gallbladder wall thickening, or biliary dilatation. Pancreas: Unremarkable. No pancreatic ductal dilatation or surrounding inflammatory changes. Spleen: Normal in size without focal abnormality. Adrenals/Urinary Tract: Adrenal glands are unremarkable. Exophytic left renal cysts. Kidneys are normal, without renal calculi, focal lesion, or hydronephrosis. Bladder is unremarkable. Stomach/Bowel:  Stomach is within normal limits. Appendix appears normal. Fatty mural stratification of the rectum. Occasional sigmoid diverticula. Vascular/Lymphatic: No significant vascular findings are  present. No enlarged abdominal or pelvic lymph nodes. Reproductive: No mass or other abnormality. Other: Small fat containing left inguinal hernia. No abdominopelvic ascites. Musculoskeletal: No acute or significant osseous findings. IMPRESSION: 1. No definite CT findings to explain GI bleeding. There are occasional sigmoid diverticula and fatty mural stratification of the rectum, suggestive of prior or chronic inflammation. 2.  Chronic and incidental findings as detailed above. Electronically Signed   By: Lauralyn Primes M.D.   On: 02/24/2019 01:45   Dg Chest Portable 1 View  Result Date: 02/22/2019 CLINICAL DATA:  Cough and fever. EXAM: PORTABLE CHEST 1 VIEW COMPARISON:  12/11/2018 FINDINGS: Post median sternotomy. Unchanged heart size and mediastinal contours. No evidence pulmonary edema. Mild left lung base scarring. No acute airspace disease. No pleural fluid or pneumothorax. No evidence of acute osseous abnormality. IMPRESSION: No acute cardiopulmonary abnormality. Electronically Signed   By: Narda Rutherford M.D.   On: 02/22/2019 02:44    EKG None  Radiology Dg Chest Portable 1 View  Result Date: 02/22/2019 CLINICAL DATA:  Cough and fever. EXAM: PORTABLE CHEST 1 VIEW COMPARISON:  12/11/2018 FINDINGS: Post median sternotomy. Unchanged heart size and mediastinal contours. No evidence pulmonary edema. Mild left lung base scarring. No acute airspace disease. No pleural fluid or pneumothorax. No evidence of acute osseous abnormality. IMPRESSION: No acute cardiopulmonary abnormality. Electronically Signed   By: Narda Rutherford M.D.   On: 02/22/2019 02:44    Procedures Procedures (including critical care time)  Medications Ordered in ED Medications  sodium chloride 0.9 % bolus 1,000 mL (has no administration in time range)    And  0.9 %  sodium chloride infusion (has no administration in time range)  pantoprazole (PROTONIX) 80 mg in sodium chloride 0.9 % 100 mL IVPB (has no administration in  time range)  pantoprazole (PROTONIX) 80 mg in sodium chloride 0.9 % 250 mL (0.32 mg/mL) infusion (has no administration in time range)  octreotide (SANDOSTATIN) 2 mcg/mL load via infusion 25 mcg (has no administration in time range)    And  octreotide (SANDOSTATIN) 500 mcg in sodium chloride 0.9 % 250 mL (2 mcg/mL) infusion (has no administration in time range)     MDM Reviewed: previous chart, nursing note and vitals Reviewed previous: labs Interpretation: labs (hemoglobin only slightly down.  ) Total time providing critical care: protonix drip and octreotide started in the ED. Consults: admitting MD  CRITICAL CARE Performed by: Lilyahna Sirmon K Irem Stoneham-Rasch Total critical care time: 45 minutes Critical care time was exclusive of separately billable procedures and treating other patients. Critical care was necessary to treat or prevent imminent or life-threatening deterioration. Critical care was time spent personally by me on the following activities: development of treatment plan with patient and/or surrogate as well as nursing, discussions with consultants, evaluation of patient's response to treatment, examination of patient, obtaining history from patient or surrogate, ordering and performing treatments and interventions, ordering and review of laboratory studies, ordering and review of radiographic studies, pulse oximetry and re-evaluation of patient's condition.  Final Clinical Impressions(s) / ED Diagnoses   Admit to medicine for GIB will keep NPO, as I suspect varices have started octreotide in the ED along with protonix.     Myishia Kasik, MD 02/24/19 [id number]

## 2019-02-24 NOTE — Progress Notes (Signed)
Daily Nursing Note  Received report from [place], [place]. Introduce self to patient who was sleepy at that time due to having received lorazepam. Patient alert and oriented to person, place, though not time. Able to state name and vocalize that he was hear for a bloody bowel movement. Protonix and Octreotide gtt continued. CIWA done at 8,12. Patient vocalized feeling more anxious in afternoon and was noted be more tremulous --> 1mg  of IV ativan given. Advanced to CLD with good tolerance.   Patients wife, [name] [phone] called for a brief update. Expressed great interested in learning about rehabilitions for ETOH abuse, endorses patient "cannot quit on his own". I vocalized that with alcoholism one must wish to make this change own their own accord. SW consult ordered for further guidance.    Pending Test:Novel Coronavirus (SARS-CoV-2 NAAT)  DC Plan: Discharge to home as early as 3/31 pending clinical stability.

## 2019-02-24 NOTE — Progress Notes (Addendum)
Social work consult received to provide patient's wife, Jabre Wasco [phone], with community resources related to substance abuse treatment for patient. Phone call attempt made, however there was no voicemail set up to leave a message for a return call.   This Child psychotherapist to provide treatment  resources to patient at bedside.  Verna Czech, [place] Clinical Social Work [facility] [phone]

## 2019-02-24 NOTE — H&P (Signed)
Date: 02/24/2019               Patient Name:  [name] MRN: [id number]  DOB: 1946-07-16 Age / Sex: 73 y.o., male   PCP: Georgina Quint, MD         Medical Service: Internal Medicine Teaching Service         Attending Physician: Dr. Gust Rung, DO    First Contact: Dr. Jaynie Bream Pager: [phone]  Second Contact: Dr. Lanelle Bal Pager: [phone]       After Hours (After 5p/  First Contact Pager: [phone]  weekends / holidays): Second Contact Pager: [phone]   Chief Complaint: Hematemesis  History of Present Illness: [name] is a 73 year old male with HTN, HLD, and CAD s/p CABG who presents with hematemesis.  He was seen in the ED 2 days ago with a several day hx of nonproductive cough and fever.  Sepsis protocol was initiated in the ED and he was given IV fluids.  Influenza was negative.  [facility] was consulted for admission.  They were concerned for COVID but patient had no supplemental oxygen requirement.  Sent lab for RVP but unable to get because sample was too old.  Additionally they believed that his presentation was partly due to alcohol withdrawal.  The hospitalist did not feel that he needed admission and he was discharged with doxycycline, Keflex, and Librium.  [name] states that since being discharged from the hospital his cough and congestion have improved.  He reports good compliance with the antibiotics and Librium.  His last drink of alcohol was 4 days ago.  He states that overall he was doing well up until last night when he took a dose of his antibiotics.  He states that afterwards he became nauseous and started vomiting blood.  He reports multiple episodes of hematemesis which prompted him to call EMS.  Associated symptoms include dark stool which began around 1 week ago and diffuse abdominal pain.   He does report several days of dark urine but denies hematuria and dysuria. He denies chest pain, shortness of breath, new fevers, and chills.   Denies sick contacts or recent travel.  In the ED, he was found to be hypertensive with SBP up to 175.  Vital signs were otherwise unremarkable.  CBC with mild normocytic anemia Hgb 12.2, thrombocytopenia at 72,000.  CMP unremarkable.  PT-INR elevated to 16.5/1.3.  Ethanol <10.  He was given IV fluids, IV Protonix, and octreotide for upper GI bleed.   Meds:  No current facility-administered medications on file prior to encounter.    Current Outpatient Medications on File Prior to Encounter  Medication Sig  . cephALEXin (KEFLEX) 500 MG capsule Take 1 capsule (500 mg total) by mouth 4 (four) times daily.  . chlordiazePOXIDE (LIBRIUM) 25 MG capsule  PO TID x 2D, then 25-50mg  PO BID X 2D, then 25-50mg  PO QD X 1D  . doxycycline (VIBRAMYCIN) 100 MG capsule Take 1 capsule (100 mg total) by mouth 2 (two) times daily. One po bid x 7 days  . FLUoxetine (PROZAC) 20 MG capsule Take 20 mg by mouth daily.  . folic acid (FOLVITE) 1 MG tablet Take 1 tablet (1 mg total) by mouth daily.  Marland Kitchen losartan (COZAAR) 25 MG tablet Take 1 tablet (25 mg total) by mouth daily.  . metoprolol succinate (TOPROL-XL) 25 MG 24 hr tablet Take 1 tablet (25 mg total) by mouth daily.  . nitroGLYCERIN (NITROSTAT) 0.4 MG SL  tablet Place 1 tablet (0.4 mg total) under the tongue every 5 (five) minutes as needed for chest pain.  Marland Kitchen thiamine 100 MG tablet Take 1 tablet (100 mg total) by mouth daily.     Allergies: Allergies as of 02/23/2019 - Review Complete 02/22/2019  Allergen Reaction Noted  . Codeine Palpitations 11/04/2011  . Vicodin [hydrocodone-acetaminophen] Nausea And Vomiting, Swelling, and Palpitations 11/05/2012   Past Medical History:  Diagnosis Date  . Anginal pain (HCC) 01/2005  . Anxiety   . Arthritis    "knees"  . Chronic lower back pain   . Coronary artery disease   . Depression   . Hepatitis    "don't know what kind; they treated me for it; was a long time ago" (12/11/2018)  . High cholesterol   .  History of bronchitis    "used to get it alot" (08/25/2012)  . Hypertension   . Panic attacks   . PTSD (post-traumatic stress disorder)    "have been treated in the past" (08/25/2012)    Family History:  Family History  Problem Relation Age of Onset  . Diabetes Mother   . Heart disease Father   . Cancer Brother     Social History: He lives at home alone.  He has a 10-pack-year smoking history and quit smoking tobacco 40 years ago.  He has a history of alcohol use disorder and drinks 1 pint of vodka per day.  He smokes marijuana but denies any other illicit drugs.  Review of Systems: A complete ROS was negative except as per HPI.   Physical Exam: Blood pressure (!) 175/76, resp. rate 13, height 5\' 10"  (1.778 m), weight 108.9 kg. General: Lying in bed in no acute distress Cardiovascular: Normal rate, regular rhythm Respiratory: Faint wheezes heard bilaterally, no crackles, normal work of breathing, normal oxygen saturations on room air Abdominal: Distended abdomen, tenderness to palpation. MSK: No lower extremity edema, warmth, or erythema bilaterally.  Skin: Warm and dry Neuro: Alert and oriented, mental status at baseline Psych: Normal behavior, affect, mood  EKG: Ordered and pending  CT Abdomen: IMPRESSION: 1. No definite CT findings to explain GI bleeding. There are occasional sigmoid diverticula and fatty mural stratification of the rectum, suggestive of prior or chronic inflammation. 2. Chronic and incidental findings as detailed above.   Assessment & Plan by Problem: Active Problems:   GI bleed  [name] is a 73 year old male with alcohol use disorder who presents with hematemesis concerning for an upper GI bleed.  Differential diagnosis includes esophagogastric varices, erosive esophagitis/gastritis, and gastric/duodenal ulcers.  Additionally he has had cough and congestion for the last several days concerning for a viral upper respiratory infection.  Upper GI  bleed: Hb 12.2 with no new episodes of hematemesis.  He is now s/p 2 L IV fluid.  Vital signs remain stable.  - Will consult gastroenterology in the morning for likely endoscopy. - Repeat CBC in the morning.  Type and screen completed. - Continue octreotide - Continue IV pantoprazole - Continue Zofran 4 mg every 6 hours PRN nausea - Will keep n.p.o. in anticipation of a potential procedure in the morning.  Upper respiratory infection: He has signs and symptoms consistent with a viral URI.  He is currently saturating well on room air.  Work-up has included influenza which was negative. - Follow-up RVP - Will consider COVID testing if RVP negative.  Alcohol use disorder: No signs of withdrawal at this time. - CIWA with Ativan  Hypertension: Home medications  include losartan 25 mg daily and metoprolol 25 mg daily - Holding home blood pressure medications in the setting of GI bleed.  FEN/GI: N.p.o. CODE STATUS: Full DVT prophylaxis: SCDs  Dispo: Admit patient to Inpatient with expected length of stay greater than 2 midnights.  Signed: Synetta Shadow, MD 02/24/2019, 1:48 AM

## 2019-02-24 NOTE — Progress Notes (Signed)
Pt. transferred from to 2W-28 via bed; alert and oriented x2 person, place; little drowsy; oriented to call button and room.

## 2019-02-24 NOTE — Progress Notes (Signed)
Pt systolic BP 160 and he is anxious.  1 mg Ativan given IVP.

## 2019-02-24 NOTE — Progress Notes (Signed)
Entered pt 's room and pt aggitated and anxious.  States in his last admission they sent him home too early and he was supposed to get the Covid test and the doctor was not following protocol.  That he went home vomiting.  Redirected him to present admission.  Assessed his VS and reviewed plan of care for this shift.

## 2019-02-25 LAB — COMPREHENSIVE METABOLIC PANEL
ALT: 77 U/L — ABNORMAL HIGH (ref 0–44)
AST: 131 U/L — AB (ref 15–41)
Albumin: 2.8 g/dL — ABNORMAL LOW (ref 3.5–5.0)
Alkaline Phosphatase: 41 U/L (ref 38–126)
Anion gap: 5 (ref 5–15)
BUN: 9 mg/dL (ref 8–23)
CO2: 22 mmol/L (ref 22–32)
Calcium: 7.3 mg/dL — ABNORMAL LOW (ref 8.9–10.3)
Chloride: 109 mmol/L (ref 98–111)
Creatinine, Ser: 1 mg/dL (ref 0.61–1.24)
GFR calc Af Amer: >60 mL/min (ref >60)
GFR calc non Af Amer: >60 mL/min (ref >60)
Glucose, Bld: 124 mg/dL — ABNORMAL HIGH (ref 70–99)
Potassium: 4.3 mmol/L (ref 3.5–5.1)
SODIUM: 136 mmol/L (ref 135–145)
Total Bilirubin: 1.8 mg/dL — ABNORMAL HIGH (ref 0.3–1.2)
Total Protein: 7.2 g/dL (ref 6.5–8.1)

## 2019-02-25 LAB — CBC
HCT: 32.1 % — ABNORMAL LOW (ref 39.0–52.0)
Hemoglobin: 10.7 g/dL — ABNORMAL LOW (ref 13.0–17.0)
MCH: 31.5 pg (ref 26.0–34.0)
MCHC: 33.3 g/dL (ref 30.0–36.0)
MCV: 94.4 fL (ref 80.0–100.0)
NRBC: 0 % (ref 0.0–0.2)
Platelets: 62 10*3/uL — ABNORMAL LOW (ref 150–400)
RBC: 3.4 MIL/uL — ABNORMAL LOW (ref 4.22–5.81)
RDW: 13.2 % (ref 11.5–15.5)
WBC: 3.8 10*3/uL — AB (ref 4.0–10.5)

## 2019-02-25 MED ORDER — SODIUM CHLORIDE 0.9 % IV SOLN
80.0000 mg | Freq: Two times a day (BID) | INTRAVENOUS | Status: DC
  Administered 2019-02-25 (×2): 80 mg via INTRAVENOUS
  Filled 2019-02-25 (×3): qty 80

## 2019-02-25 MED ORDER — METOPROLOL SUCCINATE ER 25 MG PO TB24
25.0000 mg | ORAL_TABLET | Freq: Every day | ORAL | Status: DC
  Administered 2019-02-25 – 2019-02-26 (×2): 25 mg via ORAL
  Filled 2019-02-25 (×2): qty 1

## 2019-02-25 NOTE — Progress Notes (Signed)
Techs in room bathing pts and Leta Jungling an I ran to door after hearing pt yelling.  Pt angry but techs state they are OK.  Pt told techs he is going report all of the staff

## 2019-02-25 NOTE — Progress Notes (Signed)
Pt calmer this AM.  No nausea or vomiting.  Continues the sandostatin and protonix drip.  Remained afibrile.  No tremors noted.

## 2019-02-25 NOTE — Progress Notes (Signed)
   Subjective: The patient was lying upright in his bed today upon entering the room. He denied chest pain, abdominal pain, headache, weakness, fever, chills, diaphoresis or myalgias. He continues to have bowel movements with black stool including one the prior night when he temporarily lost bowel control.  Objective:  Vital signs in last 24 hours: Vitals:   02/24/19 1729 02/24/19 2052 02/25/19 0317 02/25/19 0900  BP: (!) 153/71 (!) 160/77 (!) 146/73 (!) 145/80  Pulse: 65 66 65 65  Resp:    20  Temp:  (!) 97.2 F (36.2 C) 98.8 F (37.1 C) 98.6 F (37 C)  TempSrc:  Oral Oral Oral  SpO2:  97% 97% 98%  Weight:      Height:       General: A/O x4, in no acute distress, afebrile, nondiaphoretic Cardio: RRR, no mrg's  Pulmonary: CTA bilaterally, mild crackles in the bilateral lung bases Abdomen: Bowel sounds normal, mildly distended, nontympanic, nontender  MSK: BLE nontender, nonedematous Psych: Appropriate affect, not depressed in appearance, engages well  Assessment/Plan:  Principal Problem:   GI bleed Active Problems:   Coronary artery disease involving coronary bypass graft of native heart without angina pectoris   Essential hypertension   Chronic alcohol abuse   Thrombocytopenia (HCC)   Abnormal liver function tests   Acute upper respiratory infection  [name] is a 73 year old male with alcohol use disorder who presents with hematemesis concerning for an upper GI bleed.  Differential diagnosis includes esophagogastric varices, erosive esophagitis/gastritis, and gastric/duodenal ulcers.  Additionally he has had cough and congestion for the last several days concerning for a viral upper respiratory infection.  Upper GI bleed:  Hb 12.2 on admission now down to 10.7. With no new episodes of hematemesis since prior to admission.  Vital signs remain stable. GI recommended monitoring his Hgb and unless he is unstable hemodynamically or develops a rapid GI bleed they will await  final COVID-19 testing before considering endoscopy. This is reasonable given the risk associated with endoscopy if the patient is COVID positive. Will likely be discharged home if he remains stable to quarantine unless COVID testing returns prior to stabilization. If this occurs, he will need GI outpatient follow-up - Repeat CBC daily.  Type and screen completed. - Discontinue octreotide - Continue IV pantoprazole 80mg  BID - Continue Zofran 4 mg every 6 hours PRN nausea - Clear liquids ordered  Upper respiratory infection:  He has signs and symptoms consistent with a viral URI.  He is currently saturating well on room air.  Work-up has included influenza which was negative as well as a general RVP panel that was negative. --Continue Contact and droplet precautions - COVID testing pending.  Alcohol use disorder:  No signs of withdrawal at this time. - CIWA with Ativan  Elevated AST, ALT, T-bili, INR: Mildly elevated, likely 2/2 a more indolent EtOH cirrhosis picture but will evaluate for chronic viral etiologies as there is concern for esophageal variances given his hematemesis. Ordered Hepatitis Viral screening.  Hypertension:  Home medications include losartan 25 mg daily and metoprolol 25 mg daily - Holding home losartan in the setting of GI bleed.  - Will continue metoprolol today  FEN/GI: Clears CODE STATUS: Full DVT prophylaxis: SCDs Dispo: Anticipated discharge in approximately 2-3 day(s).   Lanelle Bal, MD 02/25/2019, 10:11 AM Pager# [phone]

## 2019-02-26 ENCOUNTER — Telehealth: Payer: Self-pay | Admitting: Gastroenterology

## 2019-02-26 DIAGNOSIS — J22 Unspecified acute lower respiratory infection: Secondary | ICD-10-CM

## 2019-02-26 DIAGNOSIS — Z7289 Other problems related to lifestyle: Secondary | ICD-10-CM

## 2019-02-26 LAB — COMMENT2 - HEP PANEL

## 2019-02-26 LAB — CBC
HCT: 31.6 % — ABNORMAL LOW (ref 39.0–52.0)
HCT: 31.1 % — ABNORMAL LOW (ref 39.0–52.0)
Hemoglobin: 10.1 g/dL — ABNORMAL LOW (ref 13.0–17.0)
Hemoglobin: 9.9 g/dL — ABNORMAL LOW (ref 13.0–17.0)
MCH: 30.2 pg (ref 26.0–34.0)
MCH: 30 pg (ref 26.0–34.0)
MCHC: 32 g/dL (ref 30.0–36.0)
MCHC: 31.8 g/dL (ref 30.0–36.0)
MCV: 94.6 fL (ref 80.0–100.0)
MCV: 94.2 fL (ref 80.0–100.0)
NRBC: 0 % (ref 0.0–0.2)
NRBC: 0 % (ref 0.0–0.2)
PLATELETS: 70 10*3/uL — AB (ref 150–400)
Platelets: 68 10*3/uL — ABNORMAL LOW (ref 150–400)
RBC: 3.34 MIL/uL — ABNORMAL LOW (ref 4.22–5.81)
RBC: 3.3 MIL/uL — ABNORMAL LOW (ref 4.22–5.81)
RDW: 13.2 % (ref 11.5–15.5)
RDW: 13.1 % (ref 11.5–15.5)
WBC: 4.6 10*3/uL (ref 4.0–10.5)
WBC: 4.2 10*3/uL (ref 4.0–10.5)

## 2019-02-26 LAB — HEPATITIS B CORE ANTIBODY, TOTAL: Hep B Core Total Ab: POSITIVE — AB

## 2019-02-26 LAB — HEPATITIS B SURFACE ANTIGEN: HEP B S AG: NEGATIVE

## 2019-02-26 LAB — HEPATITIS C ANTIBODY (REFLEX): HCV Ab: >11 s/co ratio — ABNORMAL HIGH (ref 0.0–0.9)

## 2019-02-26 LAB — HEPATITIS B SURFACE ANTIBODY, QUANTITATIVE: Hep B S AB Quant (Post): <3.1 m[IU]/mL — ABNORMAL LOW (ref >9.9)

## 2019-02-26 MED ORDER — CHLORDIAZEPOXIDE HCL 25 MG PO CAPS: 25.0000 mg | ORAL_CAPSULE | Freq: Three times a day (TID) | ORAL | Status: DC

## 2019-02-26 MED ORDER — PANTOPRAZOLE SODIUM 40 MG PO TBEC: 40.0000 mg | DELAYED_RELEASE_TABLET | Freq: Two times a day (BID) | ORAL | 1 refills | Status: DC

## 2019-02-26 MED ORDER — PANTOPRAZOLE SODIUM 40 MG PO TBEC
40.0000 mg | DELAYED_RELEASE_TABLET | Freq: Two times a day (BID) | ORAL | Status: DC
  Administered 2019-02-26: 40 mg via ORAL
  Filled 2019-02-26: qty 1

## 2019-02-26 MED ORDER — LORAZEPAM 2 MG/ML IJ SOLN: 1.0000 mg | INTRAMUSCULAR | Status: DC | PRN

## 2019-02-26 MED ORDER — LORAZEPAM 1 MG PO TABS: 1.0000 mg | ORAL_TABLET | ORAL | Status: DC | PRN

## 2019-02-26 MED ORDER — LORAZEPAM 1 MG PO TABS
1.0000 mg | ORAL_TABLET | Freq: Four times a day (QID) | ORAL | Status: DC | PRN
  Administered 2019-02-26: 1 mg via ORAL
  Filled 2019-02-26 (×2): qty 1

## 2019-02-26 MED ORDER — LORAZEPAM 2 MG/ML IJ SOLN: 1.0000 mg | Freq: Four times a day (QID) | INTRAMUSCULAR | Status: DC | PRN

## 2019-02-26 NOTE — Telephone Encounter (Signed)
We are not on unassigned for [facility].  [name] is on.  Patient was d/c today from [facility]

## 2019-02-26 NOTE — Progress Notes (Signed)
Pt refusing ativan MD notified.

## 2019-02-26 NOTE — Progress Notes (Signed)
Received call from pharmacy.  They said pt called them about his home meds being missing and that they were taken out of his room.  Called wife to see if he brought meds to hospital and she said whe was not there but his sister was and that she would call her.

## 2019-02-26 NOTE — Progress Notes (Signed)
     Subjective: Patient feels well today with no new complaints.  He still continues to have dark bowel movements but states that this is minimal and he has not had a good bowel movement in days.  He did have episodes overnight where he was agitated but calmed down after speaking with the overnight resident and receiving 1 dose of Ativan.  He denies any other complaints at this time.  Objective:  Vital signs in last 24 hours: Vitals:   02/25/19 0900 02/25/19 1654 02/26/19 0800 02/26/19 0858  BP: (!) 145/80 138/75 (!) 158/79 (!) 158/79  Pulse: 65 (!) 59 65   Resp: 20 18 20    Temp: 98.6 F (37 C) 98 F (36.7 C) 97.6 F (36.4 C)   TempSrc: Oral Oral Oral   SpO2: 98% 95% 96%   Weight:      Height:       General: Awake, alert, oriented x3, NAD CVS: Regular rate and rhythm, normal heart sounds Lungs: CTA laterally Abdomen: Soft, nontender, nondistended, normoactive bowel sounds Extremities: No edema noted  Assessment/Plan:  Principal Problem:   GI bleed Active Problems:   Coronary artery disease involving coronary bypass graft of native heart without angina pectoris   Essential hypertension   Chronic alcohol abuse   Thrombocytopenia (HCC)   Abnormal liver function tests   Acute upper respiratory infection  Patient is a 73 year old male with past medical history of alcohol use disorder who presented with hematemesis and dark bowel movements concerning for an upper GI bleed.  1.  Upper GI bleed: -Patient presented to the ED with hematemesis and dark bowel movements concerning for an upper GI bleed.  His vitals have remained stable but his hemoglobin is slowly trended down to 10.1 today from 10.7 yesterday and 12.2 on admission.  He continues to have some dark bowel movements but states that this is minimal.  I do not believe that he is currently having an active GI bleed and suspect that his dark bowel movements may be old blood given that his vital signs have remained stable and  his hemoglobin is only slowly trending down. -We will recheck a CBC this afternoon.  If this remains stable patient should be able to go home today. -Continue with IV PPI for now and transition to oral on discharge -Continue Zofran PRN nausea -Continue with clear liquid diet and advance as tolerated -Patient with mildly elevated AST and ALT with a higher AST consistent with alcohol use disorder.  We will follow-up hepatitis serologies but I suspect EtOH to be the likely etiology behind his elevated LFTs. -Patient is currently on droplet as well as contact precautions for possible COVID-19 given upper respiratory symptoms.  His respiratory viral panel is negative. Will need GI follow-up as an outpatient for possible EGD if he remains stable.  If patient does have recurrent acute bleeding will need inpatient endoscopy. -If patient does get discharged home today he will need to isolate himself at least until the test comes back. -Patient did require 1 dose of Ativan overnight but has no signs of withdrawal currently.  We will hold off on further Ativan for now -Patient expresses understanding and is in agreement with plan  Dispo: Anticipated discharge in approximately 0-1 day(s).   Earl Lagos, MD 02/26/2019, 11:22 AM

## 2019-02-26 NOTE — Progress Notes (Signed)
Called to pt room.  Pt wants him primary MD to come now and tell him if his black stool was because of his liver, kidneys or his bleeding stomach.  Explained to him a dark stool after a GI bleed would be from old blood and reviewed his hgb with him.

## 2019-02-26 NOTE — Progress Notes (Signed)
Dr Alinda Money here and got pt to take dose of ativan.  States this was his baseline in the ER and just check on him thru the night and let him rest.

## 2019-02-26 NOTE — Progress Notes (Signed)
Returned wife's call.  Updated her on pt status.  Inquired about his ETOH use and she said he drinks a lot and the last time he drank was right before he came to the hospital.

## 2019-02-26 NOTE — Telephone Encounter (Signed)
Spoke with Marylene Land RN at [facility] and advised her that Deboraha Sprang is on call for [facility] and that pt has to follow up with them.

## 2019-02-26 NOTE — Telephone Encounter (Signed)
Dr. Crista Elliot called in needing to sched the pt an offc visit asap. He did not want to schedule a wedex visit. He wants a call back soon to discuss. Pt has not gi hx doc of the day which request was made is Dr. Russella Dar.

## 2019-02-26 NOTE — Discharge Instructions (Signed)
Please call and schedule an appointment with your primary care physician and with [facility].   Please take the pantoprazole 40mg  two times per day until your see the gastroenterologist.   You will need to get a CBC in one week to make certain your blood count is stable.  Gastrointestinal Bleeding  Gastrointestinal bleeding is bleeding somewhere along the path food travels through the body (digestive tract). This path is anywhere between the mouth and the opening of the butt (anus). You may have blood in your poop (stools) or have black poop. If you throw up (vomit), there may be blood in it. This condition can be mild, serious, or even life-threatening. If you have a lot of bleeding, you may need to stay in the hospital. Follow these instructions at home:  Take over-the-counter and prescription medicines only as told by your doctor.  Eat foods that have a lot of fiber in them. These foods include whole grains, fruits, and vegetables. You can also try eating 1-3 prunes each day.  Drink enough fluid to keep your pee (urine) clear or pale yellow.  Keep all follow-up visits as told by your doctor. This is important. Contact a doctor if:  Your symptoms do not get better. Get help right away if:  Your bleeding gets worse.  You feel dizzy or you pass out (faint).  You feel weak.  You have very bad cramps in your back or belly (abdomen).  You pass large clumps of blood (clots) in your poop.  Your symptoms are getting worse. This information is not intended to replace advice given to you by your health care provider. Make sure you discuss any questions you have with your health care provider. Document Released: 08/24/2008 Document Revised: 04/22/2016 Document Reviewed: 05/05/2015 Elsevier Interactive Patient Education  2019 [employer].

## 2019-02-26 NOTE — Progress Notes (Signed)
Spoke with [facility] MD about pt mental status change.  Pt is aggitated demanding IV be removed and the air alarm on IV pump has given him air in his IV .  States his home meds were taken from his room.  That he doesn't feel safe her.  With recent admission with wife that pt was drinking up to his admission I was concerned of pt going into withdrawal.  Will give pt a dose of ativan.

## 2019-02-26 NOTE — Discharge Summary (Signed)
Name: [name] MRN: [id number] DOB: 07/01/1946 73 y.o. PCP: Georgina Quint, MD  Date of Admission: 02/23/2019 11:58 PM Date of Discharge: 02/26/2019 Attending Physician: Dr. Earl Lagos  Discharge Diagnosis: 1. Hematemesis  2. Lower Respiratory Tract infection 3. Elevated AST, ALT, T-bili, INR  Discharge Medications: Allergies as of 02/26/2019      Reactions   Codeine Palpitations   Vicodin [hydrocodone-acetaminophen] Nausea And Vomiting, Swelling, Palpitations      Medication List    STOP taking these medications   cephALEXin 500 MG capsule Commonly known as:  KEFLEX   chlordiazePOXIDE 25 MG capsule Commonly known as:  LIBRIUM   doxycycline 100 MG capsule Commonly known as:  VIBRAMYCIN     TAKE these medications   FLUoxetine 20 MG capsule Commonly known as:  PROZAC Take 20 mg by mouth daily.   folic acid 1 MG tablet Commonly known as:  FOLVITE Take 1 tablet (1 mg total) by mouth daily.   losartan 25 MG tablet Commonly known as:  COZAAR Take 1 tablet (25 mg total) by mouth daily.   metoprolol succinate 25 MG 24 hr tablet Commonly known as:  TOPROL-XL Take 1 tablet (25 mg total) by mouth daily.   nitroGLYCERIN 0.4 MG SL tablet Commonly known as:  Nitrostat Place 1 tablet (0.4 mg total) under the tongue every 5 (five) minutes as needed for chest pain.   pantoprazole 40 MG tablet Commonly known as:  PROTONIX Take 1 tablet (40 mg total) by mouth 2 (two) times daily.   thiamine 100 MG tablet Take 1 tablet (100 mg total) by mouth daily.      Disposition and follow-up:   [name] was discharged from [facility] in Fair condition.  At the hospital follow up visit please address:  1.  Hematemesis:  Felt to be upper but due to concern for possible viral LRI and with the COVID pandemic without the patient experiencing a life threatening bleed or hemodynamic instability he was advised to follow-up once testing was  negative. Discharged on PPI 40mg  pantoprazole BID until he sees GI.  Lower Respiratory Infection:  Low concern for COVID but with fever and a cough. Stable, did not require supplemental oxygen or other respiratory interventions. Testing is delayed by 5-10 days, ordered on 02/24/2019.  Elevated AST, ALT, T-bili, INR: Likely EtOH related, but viral serologies pending.   2.  Labs / imaging needed at time of follow-up: CBC  3.  Pending labs/ test needing follow-up: COVID testing, Hepatitis screening labs  Follow-up Appointments: Follow-up Information    Sagardia, Eilleen Kempf, MD. Schedule an appointment as soon as possible for a visit.   Specialty:  Internal Medicine Why:  Call to schedule with your PCP. Contact information: [address] [place] [address] [phone]        Gastroenterology, Deboraha Sprang. Schedule an appointment as soon as possible for a visit.   Why:  Call and schedule an appointment. Contact information: [address] [place] [address] [phone]          Hospital Course by problem list: [name] is a 73 year old male with alcohol use disorder who presents with hematemesis concerning for an upper GI bleed. Differential diagnosis includes esophagogastric varices, erosive esophagitis/gastritis, and gastric/duodenal ulcers. Additionally he has had cough and congestion for the last several days concerning for a viral upper respiratory infection.  Upper GI bleed: Hb 12.2 on admission down to 9.9 by discharge. No ongoing hematemesis  but with dark stool persisting. Vital signs remained stable. GI recommended monitoring his Hgb and unless he was unstable hemodynamically or developed a rapid GI bleed we were advised that he should follow-up as an outpatient if stable for discharge. In addition he was placed on octreotide for 24 hours due to concern for esophageal varices and IV PPI's. He remained stable and had diminished cough being afebrile since  admit at time of discharge. I have attempted to reach out to [facility], but due to the COVID issue, the patient will need to call and schedule this appointment.   Lower respiratory infection:  He has signs and symptoms consistent with a viral URI. He currently saturated well on room air throughout his stay and did not require additional respiratory support or treatment. Work-up had included influenza which was negative as well as a general RVP panel that was negative. COVID was ordered but low likelihood, test result not expected until 04/02-04/07.   Elevated AST, ALT, T-bili, INR: Mildly elevated, likely 2/2 a more indolent EtOH cirrhosis picture but will evaluate for chronic viral etiologies as there is concern for esophageal variances given his hematemesis. Ordered Hepatitis Viral screening, labs pending.  Discharge Vitals:   BP (!) 164/76   Pulse 64   Temp 98 F (36.7 C) (Oral)   Resp 20   Ht 5\' 10"  (1.778 m)   Wt 108.9 kg   SpO2 97%   BMI 34.45 kg/m   Pertinent Labs, Studies, and Procedures:  CBC Latest Ref Rng & Units 02/26/2019 02/26/2019 02/25/2019  WBC 4.0 - 10.5 K/uL 4.2 4.6 3.8(L)  Hemoglobin 13.0 - 17.0 g/dL 5.0(T) 10.1(L) 10.7(L)  Hematocrit 39.0 - 52.0 % 31.1(L) 31.6(L) 32.1(L)  Platelets 150 - 400 K/uL 68(L) 70(L) 62(L)   CMP Latest Ref Rng & Units 02/25/2019 02/24/2019 02/24/2019  Glucose 70 - 99 mg/dL 888(K) 800(L) 491(P)  BUN 8 - 23 mg/dL 9 11 9   Creatinine 0.61 - 1.24 mg/dL 9.15 0.56 9.79  Sodium 135 - 145 mmol/L 136 138 137  Potassium 3.5 - 5.1 mmol/L 4.3 4.0 3.6  Chloride 98 - 111 mmol/L 109 106 106  CO2 22 - 32 mmol/L 22 18(L) 21(L)  Calcium 8.9 - 10.3 mg/dL 7.3(L) 7.6(L) 8.2(L)  Total Protein 6.5 - 8.1 g/dL 7.2 7.4 7.8  Total Bilirubin 0.3 - 1.2 mg/dL 4.8(A) 1.6(P) 5.3(Z)  Alkaline Phos 38 - 126 U/L 41 46 50  AST 15 - 41 U/L 131(H) 90(H) 98(H)  ALT 0 - 44 U/L 77(H) 64(H) 66(H)   Discharge Instructions: Discharge Instructions    Call MD for:   difficulty breathing, headache or visual disturbances   Complete by:  As directed    Call MD for:  persistant dizziness or light-headedness   Complete by:  As directed    Call MD for:  persistant nausea and vomiting   Complete by:  As directed    Call MD for:  temperature >100.4   Complete by:  As directed    Diet - low sodium heart healthy   Complete by:  As directed    Increase activity slowly   Complete by:  As directed      Signed: Lanelle Bal, MD 02/26/2019, 1:53 PM   Pager: Pager# [phone]

## 2019-02-26 NOTE — Progress Notes (Signed)
Pt aggitated and anxious.  States he is calling his lawyer due to not feeing safe.  States Farley NT is making him feel uncomfortable.  Asked him what Leta Jungling has done and said just had a feeling that he would cause him problems.  Asked him what makes him feel like that and said he has not been in his room but could not give a reason. But he mated him removed from the unit.  After listening to his numerous issues with the staff pt appeared calmer.

## 2019-02-27 ENCOUNTER — Ambulatory Visit: Payer: Self-pay | Admitting: *Deleted

## 2019-02-27 LAB — CULTURE, BLOOD (ROUTINE X 2)
Culture: NO GROWTH
Culture: NO GROWTH

## 2019-02-27 LAB — NOVEL CORONAVIRUS, NAA (HOSP ORDER, SEND-OUT TO REF LAB; TAT 18-24 HRS): SARS-CoV-2, NAA: NOT DETECTED

## 2019-02-27 NOTE — Telephone Encounter (Signed)
Pt call in regards to the Covid-19 results. Results were negative given to patient. Would like a call back to talk with his provider. Advised of his virtual appointment to see provider tomorrow at 11 am. Pt voiced understanding.

## 2019-02-28 ENCOUNTER — Telehealth (INDEPENDENT_AMBULATORY_CARE_PROVIDER_SITE_OTHER): Payer: Medicare Other | Admitting: Emergency Medicine

## 2019-02-28 ENCOUNTER — Other Ambulatory Visit: Payer: Self-pay

## 2019-02-28 ENCOUNTER — Telehealth: Payer: Self-pay | Admitting: Emergency Medicine

## 2019-02-28 DIAGNOSIS — I10 Essential (primary) hypertension: Secondary | ICD-10-CM | POA: Diagnosis not present

## 2019-02-28 DIAGNOSIS — J069 Acute upper respiratory infection, unspecified: Secondary | ICD-10-CM

## 2019-02-28 DIAGNOSIS — J22 Unspecified acute lower respiratory infection: Secondary | ICD-10-CM | POA: Diagnosis not present

## 2019-02-28 DIAGNOSIS — F1010 Alcohol abuse, uncomplicated: Secondary | ICD-10-CM

## 2019-02-28 DIAGNOSIS — K922 Gastrointestinal hemorrhage, unspecified: Secondary | ICD-10-CM

## 2019-02-28 NOTE — Progress Notes (Signed)
Telemedicine Encounter- SOAP NOTE Established Patient  This telephone encounter was conducted with the patient's (or proxy's) verbal consent via audio telecommunications: yes/no: Yes Patient was instructed to have this encounter in a suitably private space; and to only have persons present to whom they give permission to participate. In addition, patient identity was confirmed by use of name plus two identifiers (DOB and address).  I discussed the limitations, risks, security and privacy concerns of performing an evaluation and management service by telephone and the availability of in person appointments. I also discussed with the patient that there may be a patient responsible charge related to this service. The patient expressed understanding and agreed to proceed.  I spent a total of TIME; 0 MIN TO 60 MIN: 15 minutes talking with the patient or their proxy.  No chief complaint on file.   Subjective   [name] is a 73 y.o. male established patient. Telephone visit today for hospital follow-up.  Discharge summary as follows: Name: [name] MRN: [id number] DOB: Aug 31, 1946 73 y.o. PCP: Ronald Quint, MD  Date of Admission: 02/23/2019 11:58 PM Date of Discharge: 02/26/2019 Attending Physician: Dr. Earl [name]  Discharge Diagnosis: 1. Hematemesis  2. Lower Respiratory Tract infection 3. Elevated AST, ALT, T-bili, INR  Discharge Medications:      Allergies as of 02/26/2019      Reactions   Codeine Palpitations   Vicodin [hydrocodone-acetaminophen] Nausea And Vomiting, Swelling, Palpitations         Medication List    STOP taking these medications   cephALEXin 500 MG capsule Commonly known as:  KEFLEX   chlordiazePOXIDE 25 MG capsule Commonly known as:  LIBRIUM   doxycycline 100 MG capsule Commonly known as:  VIBRAMYCIN     TAKE these medications   FLUoxetine 20 MG capsule Commonly known as:  PROZAC Take 20 mg by mouth daily.    folic acid 1 MG tablet Commonly known as:  FOLVITE Take 1 tablet (1 mg total) by mouth daily.   losartan 25 MG tablet Commonly known as:  COZAAR Take 1 tablet (25 mg total) by mouth daily.   metoprolol succinate 25 MG 24 hr tablet Commonly known as:  TOPROL-XL Take 1 tablet (25 mg total) by mouth daily.   nitroGLYCERIN 0.4 MG SL tablet Commonly known as:  Nitrostat Place 1 tablet (0.4 mg total) under the tongue every 5 (five) minutes as needed for chest pain.   pantoprazole 40 MG tablet Commonly known as:  PROTONIX Take 1 tablet (40 mg total) by mouth 2 (two) times daily.   thiamine 100 MG tablet Take 1 tablet (100 mg total) by mouth daily.      Disposition and follow-up:   [name] was discharged from [facility] in Fair condition.  At the hospital follow up visit please address:  1.  Hematemesis:  Felt to be upper but due to concern for possible viral LRI and with the COVID pandemic without the patient experiencing a life threatening bleed or hemodynamic instability he was advised to follow-up once testing was negative. Discharged on PPI 40mg  pantoprazole BID until he sees GI.  Lower Respiratory Infection:  Low concern for COVID but with fever and a cough. Stable, did not require supplemental oxygen or other respiratory interventions. Testing is delayed by 5-10 days, ordered on 02/24/2019.  Elevated AST, ALT, T-bili, INR: Likely EtOH related, but viral serologies pending.   2.  Labs / imaging needed at time of  follow-up: CBC  3.  Pending labs/ test needing follow-up: COVID testing, Hepatitis screening labs  Follow-up Appointments:    Follow-up Information    [name], Ronald KempfMiguel Jose, MD. Schedule an appointment as soon as possible for a visit.   Specialty:  Internal Medicine Why:  Call to schedule with your PCP. Contact information: [address] [place] [id number] [phone]        Gastroenterology, Ronald SprangEagle.  Schedule an appointment as soon as possible for a visit.   Why:  Call and schedule an appointment. Contact information: [address] [place] [address] [phone]   Feels much better.  Recovering well.  No longer vomiting blood.  No melena but no bowel movement in the past 4 to 5 days.  Respiratory symptoms much improved.  No longer coughing.  Finished antibiotics.  No fever or chills.  Able to eat and drink well.  No nausea or vomiting.  No new symptomatology, no no complaints, or medical concerns today.  HPI   Patient Active Problem List   Diagnosis Date Noted  . GI bleed 02/24/2019  . Thrombocytopenia (HCC) 02/24/2019  . Abnormal liver function tests 02/24/2019  . Acute upper respiratory infection 02/24/2019  . Alcohol withdrawal (HCC) 12/12/2018  . Hypertensive urgency 12/11/2018  . Ischemic chest pain (HCC) 12/11/2018  . Chronic alcohol abuse 12/09/2015  . Left knee pain 12/02/2015  . Coronary artery disease involving coronary bypass graft of native heart without angina pectoris 08/07/2014  . Essential hypertension 08/07/2014  . Hyperlipidemia 08/07/2014    Past Medical History:  Diagnosis Date  . Anginal pain (HCC) 01/2005  . Anxiety   . Arthritis    "knees"  . Chronic lower back pain   . Coronary artery disease   . Depression   . Hepatitis    "don't know what kind; they treated me for it; was a long time ago" (12/11/2018)  . High cholesterol   . History of bronchitis    "used to get it alot" (08/25/2012)  . Hypertension   . Panic attacks   . PTSD (post-traumatic stress disorder)    "have been treated in the past" (08/25/2012)    Current Outpatient Medications  Medication Sig Dispense Refill  . FLUoxetine (PROZAC) 20 MG capsule Take 20 mg by mouth daily.    . folic acid (FOLVITE) 1 MG tablet Take 1 tablet (1 mg total) by mouth daily. 30 tablet 3  . losartan (COZAAR) 25 MG tablet Take 1 tablet (25 mg total) by mouth daily. 90 tablet 1  .  metoprolol succinate (TOPROL-XL) 25 MG 24 hr tablet Take 1 tablet (25 mg total) by mouth daily. 90 tablet 4  . nitroGLYCERIN (NITROSTAT) 0.4 MG SL tablet Place 1 tablet (0.4 mg total) under the tongue every 5 (five) minutes as needed for chest pain. 25 tablet 3  . pantoprazole (PROTONIX) 40 MG tablet Take 1 tablet (40 mg total) by mouth 2 (two) times daily. 60 tablet 1  . thiamine 100 MG tablet Take 1 tablet (100 mg total) by mouth daily. 30 tablet 3   No current facility-administered medications for this visit.     Allergies  Allergen Reactions  . Codeine Palpitations  . Vicodin [Hydrocodone-Acetaminophen] Nausea And Vomiting, Swelling and Palpitations    Social History   Socioeconomic History  . Marital status: Divorced    Spouse name: Not on file  . Number of children: Not on file  . Years of education: Not on file  . Highest  education level: Not on file  Occupational History  . Not on file  Social Needs  . Financial resource strain: Not on file  . Food insecurity:    Worry: Not on file    Inability: Not on file  . Transportation needs:    Medical: Not on file    Non-medical: Not on file  Tobacco Use  . Smoking status: Former Smoker    Packs/day: 2.00    Years: 5.00    Pack years: 10.00  . Smokeless tobacco: Never Used  . Tobacco comment: 08/25/2012 "quit smoking cigarettes 40 yr ago"  Substance and Sexual Activity  . Alcohol use: Yes    Alcohol/week: 60.0 standard drinks    Types: 60 Standard drinks or equivalent per week    Comment: 12/11/2018 "~ 1 pint of vodka/day"  . Drug use: Yes    Types: Marijuana    Comment: 08/25/2012 "last marijuana 10 years ago"  . Sexual activity: Yes  Lifestyle  . Physical activity:    Days per week: Not on file    Minutes per session: Not on file  . Stress: Not on file  Relationships  . Social connections:    Talks on phone: Not on file    Gets together: Not on file    Attends religious service: Not on file    Active member of  club or organization: Not on file    Attends meetings of clubs or organizations: Not on file    Relationship status: Not on file  . Intimate partner violence:    Fear of current or ex partner: Not on file    Emotionally abused: Not on file    Physically abused: Not on file    Forced sexual activity: Not on file  Other Topics Concern  . Not on file  Social History Narrative  . Not on file    Review of Systems  Constitutional: Negative.  Negative for chills and fever.  HENT: Negative for congestion and sore throat.   Eyes: Negative for discharge and redness.  Respiratory: Negative.  Negative for cough, hemoptysis, sputum production, shortness of breath and wheezing.   Cardiovascular: Negative for chest pain and palpitations.  Gastrointestinal: Positive for constipation. Negative for abdominal pain, blood in stool, diarrhea, heartburn, melena, nausea and vomiting.  Genitourinary: Negative for dysuria and hematuria.  Musculoskeletal: Negative for myalgias.  Neurological: Negative for dizziness and headaches.  All other systems reviewed and are negative.   Objective   Vitals as reported by the patient: None available Diagnoses and all orders for this visit:  Essential hypertension  Lower respiratory infection Comments: improved  Gastrointestinal hemorrhage, unspecified gastrointestinal hemorrhage type Comments: resolved  Chronic alcohol abuse  Acute upper respiratory infection  Patient clinically stable.  Recovering well.  No concerns identified during this visit. Continue present medications. Follow-up in 3 months.   I discussed the assessment and treatment plan with the patient. The patient was provided an opportunity to ask questions and all were answered. The patient agreed with the plan and demonstrated an understanding of the instructions.   The patient was advised to call back or seek an in-person evaluation if the symptoms worsen or if the condition fails to  improve as anticipated.  I provided 15 minutes of non-face-to-face time during this encounter.  Ronald Quint, MD  Primary Care at [facility]

## 2019-02-28 NOTE — Telephone Encounter (Signed)
Thanks

## 2019-02-28 NOTE — Telephone Encounter (Signed)
02/28/2019 - PATIENT HAD A TELEMED VISIT WITH DR. Irving Shows TODAY (02/28/2019). DR. Irving Shows WANTED TO SEE HIM AGAIN IN 3 MONTHS WHICH IS IN July. PATIENT IS AWARE THAT IT IS SCHEDULED FOR 06/05/2019 AT 1:40pm. HE ALSO HAS A PREVIOUS SCHEDULED APPOINTMENT FROM WHEN HE WAS SEEN IN FEB. 2020 AND DR. MIGUEL WANTED TO SEE HIM BACK IN 3 MONTHS. THAT APPOINTMENT IS FOR 04/24/2019 AT 3:40pm. I ASKED CYNTHIA IF HE NEEDED TO KEEP THIS APPOINTMENT IN MAY AND SHE SAID HE DOES. I CALLED TO LET HIM KNOW BUT HIS MAILBOX IS FULL. MBC

## 2019-02-28 NOTE — Telephone Encounter (Signed)
Copied from CRM [id number]. Topic: General - Other >> Feb 28, 2019  2:46 PM Burchel, Abbi R wrote: Reason for CRM:  Pt would like a call to discuss if/when he can return to work.  Pt: [phone]

## 2019-03-01 ENCOUNTER — Telehealth: Payer: Self-pay | Admitting: Emergency Medicine

## 2019-03-01 ENCOUNTER — Ambulatory Visit: Payer: Self-pay | Admitting: *Deleted

## 2019-03-01 ENCOUNTER — Ambulatory Visit: Payer: Self-pay

## 2019-03-01 NOTE — Telephone Encounter (Signed)
Was triaging pt and [employer] disconnected. Pt called back and is speaking to another triage nurse.

## 2019-03-01 NOTE — Telephone Encounter (Signed)
Pt calling with multiple issues. States left foot remains swollen. States started in hospital 02/24/2019. States hands and right Foot no longer swollen. States left foot swollen from foot to "Just above knee.' Reports pitting. Has been keeping elevated, "Not helping." Also reports productive cough which was discussed with Dr. Alvy Bimler yesterday. States "No better." States wheezing, incentive spirometer helps. Denies SOB, fever. Reports "No BM 4-5 days."Also questioning how long he should be out of work; [employer] were already sent.'  Email and phone number verified  States use personal email ,updated. Pt states Dr. Alvy Bimler reviewed quarantine precautions with him yesterday.   Please advise:  CB [phone] Answer Assessment - Initial Assessment Questions 1. ONSET: "When did the cough begin?"      02/24/2019 2. SEVERITY: "How bad is the cough today?"      Worsened once home, discharged 3/30 3. RESPIRATORY DISTRESS: "Describe your breathing."      Wheezing, mild 4. FEVER: "Do you have a fever?" If so, ask: "What is your temperature, how was it measured, and when did it start?"     no 5. SPUTUM: "Describe the color of your sputum" (clear, white, yellow, green)     Yellowish-green 6. HEMOPTYSIS: "Are you coughing up any blood?" If so ask: "How much?" (flecks, streaks, tablespoons, etc.)     no 7. CARDIAC HISTORY: "Do you have any history of heart disease?" (e.g., heart attack, congestive heart failure)      yes 8. LUNG HISTORY: "Do you have any history of lung disease?"  (e.g., pulmonary embolus, asthma, emphysema)     no 9. PE RISK FACTORS: "Do you have a history of blood clots?" (or: recent major surgery, recent prolonged travel, bedridden)    no 10. OTHER SYMPTOMS: "Do you have any other symptoms?" (e.g., runny nose, wheezing, chest pain)       Wheezing "All the time"  Using IS given to him in hospital, helps. Left foot to knee swollen. No BM 4-5 days 12. TRAVEL: "Have you traveled out  of the country in the last month?" (e.g., travel history, exposures)       no  Protocols used: COUGH - ACUTE PRODUCTIVE-A-AH

## 2019-03-01 NOTE — Telephone Encounter (Signed)
Looked at chart but didn't see anything about swollen foot...do you remember talking about swollen foot?

## 2019-03-01 NOTE — Telephone Encounter (Signed)
Copied from CRM [id number]. Topic: General - Other >> Mar 01, 2019 10:04 AM Tamela Oddi wrote: Reason for CRM: Patient called to request that the nurse call him regarding his swollen foot.  He stated that it has not gotten any better and he would like to know what he should do.  CB# [phone]

## 2019-03-01 NOTE — Telephone Encounter (Signed)
Please advise 

## 2019-03-02 NOTE — Telephone Encounter (Signed)
Patient called and says he's swollen to both feet and lower legs, no redness, painful when walking. He says his SOB is getting worse and hard for him to walk. He says someone was supposed to call him back and he hasn't heard from anyone and he needs to get the fluid off. I called and spoke to [place], [facility] who says Dr. Alvy Bimler or his assistant is not in the office and to send another note over, then she will send it to clinical and let the patient know someone will call him back. I advised the patient of the above and advised if he doesn't hear from the office today and his swelling, SOB worsens to go to the ED or UC, he verbalized understanding and says he will wait on a call today.

## 2019-03-02 NOTE — Telephone Encounter (Signed)
Has apt Monday with Dr. Alvy Bimler.

## 2019-03-03 NOTE — Telephone Encounter (Signed)
No

## 2019-03-03 NOTE — Telephone Encounter (Signed)
With those symptoms I recommend he goes to urgent care center or the ER emergency department.  Thanks.

## 2019-03-05 ENCOUNTER — Encounter: Payer: Self-pay | Admitting: Emergency Medicine

## 2019-03-05 ENCOUNTER — Ambulatory Visit (INDEPENDENT_AMBULATORY_CARE_PROVIDER_SITE_OTHER): Payer: Medicare Other | Admitting: Emergency Medicine

## 2019-03-05 ENCOUNTER — Other Ambulatory Visit: Payer: Self-pay

## 2019-03-05 ENCOUNTER — Ambulatory Visit (INDEPENDENT_AMBULATORY_CARE_PROVIDER_SITE_OTHER): Payer: Medicare Other

## 2019-03-05 VITALS — BP 154/76 | HR 62 | Temp 98.4°F | Resp 16 | Ht 70.0 in | Wt 249.4 lb

## 2019-03-05 DIAGNOSIS — I10 Essential (primary) hypertension: Secondary | ICD-10-CM

## 2019-03-05 DIAGNOSIS — R0609 Other forms of dyspnea: Secondary | ICD-10-CM

## 2019-03-05 DIAGNOSIS — R600 Localized edema: Secondary | ICD-10-CM

## 2019-03-05 DIAGNOSIS — L03116 Cellulitis of left lower limb: Secondary | ICD-10-CM | POA: Insufficient documentation

## 2019-03-05 DIAGNOSIS — R0602 Shortness of breath: Secondary | ICD-10-CM | POA: Diagnosis not present

## 2019-03-05 DIAGNOSIS — F1010 Alcohol abuse, uncomplicated: Secondary | ICD-10-CM

## 2019-03-05 MED ORDER — CEFADROXIL 500 MG PO CAPS: 500.0000 mg | ORAL_CAPSULE | Freq: Two times a day (BID) | ORAL | 0 refills | Status: DC

## 2019-03-05 MED ORDER — FUROSEMIDE 20 MG PO TABS: 20.0000 mg | ORAL_TABLET | Freq: Every day | ORAL | 3 refills | Status: DC

## 2019-03-05 MED ORDER — CEFTRIAXONE SODIUM 1 G IJ SOLR
1.0000 g | Freq: Once | INTRAMUSCULAR | Status: AC
  Administered 2019-03-05: 17:00:00 1 g via INTRAMUSCULAR

## 2019-03-05 NOTE — Progress Notes (Signed)
[name] 73 y.o.   Chief Complaint  Patient presents with  . Edema    both legs from the knee to the  both feet since 02/23/19 did not see any swelling before went to the hospital     HISTORY OF PRESENT ILLNESS: This is a 73 y.o. male complaining of swelling of both legs that started 1 week ago.  Also complaining of dyspnea on exertion with cough and increased shortness of breath when lying down.  Denies chest pain.  Denies fever or chills. Also complaining of redness to left knee area.  Patient Active Problem List   Diagnosis Date Noted  . Thrombocytopenia (HCC) 02/24/2019  . Abnormal liver function tests 02/24/2019  . Hypertensive urgency 12/11/2018  . Chronic alcohol abuse 12/09/2015  . Left knee pain 12/02/2015  . Coronary artery disease involving coronary bypass graft of native heart without angina pectoris 08/07/2014  . Essential hypertension 08/07/2014  . Hyperlipidemia 08/07/2014    HPI   Prior to Admission medications   Medication Sig Start Date End Date Taking? Authorizing Provider  FLUoxetine (PROZAC) 20 MG capsule Take 20 mg by mouth daily.   Yes [provider]  folic acid (FOLVITE) 1 MG tablet Take 1 tablet (1 mg total) by mouth daily. 01/24/19  Yes Amonte Brookover, Eilleen Kempf, MD  losartan (COZAAR) 25 MG tablet Take 1 tablet (25 mg total) by mouth daily. 01/24/19 04/24/19 Yes Raunel Dimartino, Eilleen Kempf, MD  metoprolol succinate (TOPROL-XL) 25 MG 24 hr tablet Take 1 tablet (25 mg total) by mouth daily. 04/25/18  Yes Georgie Chard D, NP  nitroGLYCERIN (NITROSTAT) 0.4 MG SL tablet Place 1 tablet (0.4 mg total) under the tongue every 5 (five) minutes as needed for chest pain. 01/24/19  Yes Kord Monette, Eilleen Kempf, MD  pantoprazole (PROTONIX) 40 MG tablet Take 1 tablet (40 mg total) by mouth 2 (two) times daily. 02/26/19  Yes Lanelle Bal, MD  thiamine 100 MG tablet Take 1 tablet (100 mg total) by mouth daily. 01/24/19  Yes Georgina Quint, MD    Allergies   Allergen Reactions  . Codeine Palpitations  . Vicodin [Hydrocodone-Acetaminophen] Nausea And Vomiting, Swelling and Palpitations    Patient Active Problem List   Diagnosis Date Noted  . Thrombocytopenia (HCC) 02/24/2019  . Abnormal liver function tests 02/24/2019  . Hypertensive urgency 12/11/2018  . Chronic alcohol abuse 12/09/2015  . Left knee pain 12/02/2015  . Coronary artery disease involving coronary bypass graft of native heart without angina pectoris 08/07/2014  . Essential hypertension 08/07/2014  . Hyperlipidemia 08/07/2014    Past Medical History:  Diagnosis Date  . Anginal pain (HCC) 01/2005  . Anxiety   . Arthritis    "knees"  . Chronic lower back pain   . Coronary artery disease   . Depression   . Hepatitis    "don't know what kind; they treated me for it; was a long time ago" (12/11/2018)  . High cholesterol   . History of bronchitis    "used to get it alot" (08/25/2012)  . Hypertension   . Panic attacks   . PTSD (post-traumatic stress disorder)    "have been treated in the past" (08/25/2012)    Past Surgical History:  Procedure Laterality Date  . CARDIAC CATHETERIZATION  01/2005  . CORONARY ARTERY BYPASS GRAFT  01/2005   CABG X3  . Shrapnel Left 1969   LLE; left lateral thumb (required grafting)  . VARICOSE VEIN SURGERY  1980's   LLE  Social History   Socioeconomic History  . Marital status: Divorced    Spouse name: Not on file  . Number of children: Not on file  . Years of education: Not on file  . Highest education level: Not on file  Occupational History  . Not on file  Social Needs  . Financial resource strain: Not on file  . Food insecurity:    Worry: Not on file    Inability: Not on file  . Transportation needs:    Medical: Not on file    Non-medical: Not on file  Tobacco Use  . Smoking status: Former Smoker    Packs/day: 2.00    Years: 5.00    Pack years: 10.00  . Smokeless tobacco: Never Used  . Tobacco comment: 08/25/2012  "quit smoking cigarettes 40 yr ago"  Substance and Sexual Activity  . Alcohol use: Yes    Alcohol/week: 60.0 standard drinks    Types: 60 Standard drinks or equivalent per week    Comment: 12/11/2018 "~ 1 pint of vodka/day"  . Drug use: Yes    Types: Marijuana    Comment: 08/25/2012 "last marijuana 10 years ago"  . Sexual activity: Yes  Lifestyle  . Physical activity:    Days per week: Not on file    Minutes per session: Not on file  . Stress: Not on file  Relationships  . Social connections:    Talks on phone: Not on file    Gets together: Not on file    Attends religious service: Not on file    Active member of club or organization: Not on file    Attends meetings of clubs or organizations: Not on file    Relationship status: Not on file  . Intimate partner violence:    Fear of current or ex partner: Not on file    Emotionally abused: Not on file    Physically abused: Not on file    Forced sexual activity: Not on file  Other Topics Concern  . Not on file  Social History Narrative  . Not on file    Family History  Problem Relation Age of Onset  . Diabetes Mother   . Heart disease Father   . Cancer Brother      Review of Systems  Constitutional: Negative.  Negative for chills and fever.  HENT: Negative.  Negative for sore throat.   Respiratory: Positive for cough and shortness of breath (DOE).   Cardiovascular: Positive for orthopnea and leg swelling. Negative for chest pain, palpitations and PND.  Gastrointestinal: Negative for abdominal pain, diarrhea, nausea and vomiting.  Genitourinary: Negative.  Negative for dysuria and hematuria.  Musculoskeletal: Negative for myalgias and neck pain.  Skin: Negative.  Negative for rash.  Neurological: Negative.  Negative for dizziness and headaches.  Endo/Heme/Allergies: Negative.   All other systems reviewed and are negative.  Vitals:   03/05/19 1435 03/05/19 1436  BP: (!) 167/75 (!) 154/76  Pulse: 62   Resp: 16    Temp: 98.4 F (36.9 C)   SpO2: 96%      Physical Exam Vitals signs reviewed.  Constitutional:      Appearance: Normal appearance.  HENT:     Head: Normocephalic and atraumatic.     Nose: Nose normal.     Mouth/Throat:     Mouth: Mucous membranes are moist.     Pharynx: Oropharynx is clear.  Eyes:     Extraocular Movements: Extraocular movements intact.     Conjunctiva/sclera: Conjunctivae  normal.     Pupils: Pupils are equal, round, and reactive to light.  Neck:     Musculoskeletal: Normal range of motion and neck supple.  Cardiovascular:     Rate and Rhythm: Normal rate and regular rhythm.     Heart sounds: Normal heart sounds. No murmur.  Pulmonary:     Effort: Pulmonary effort is normal.     Breath sounds: Rales (Lower posterior thirds) present.  Abdominal:     Tenderness: There is no abdominal tenderness.     Comments: +ascites  Musculoskeletal:     Right lower leg: Edema present.     Left lower leg: Edema present.     Comments: Left knee: Positive erythema, warmth to touch, full range of motion, see picture below.  Skin:    General: Skin is warm and dry.     Capillary Refill: Capillary refill takes less than 2 seconds.  Neurological:     General: No focal deficit present.     Mental Status: He is alert and oriented to person, place, and time.  Psychiatric:        Mood and Affect: Mood normal.        Behavior: Behavior normal.      Dg Chest 2 View  Result Date: 03/05/2019 CLINICAL DATA:  Shortness of breath with exertion.  Previous CABG. EXAM: CHEST - 2 VIEW COMPARISON:  02/22/2019 FINDINGS: Previous median sternotomy and CABG. Mild cardiomegaly. Chronic aortic atherosclerosis. Evidence of heart failure or effusion. Newly seen region of atelectasis in the lingula. Central bronchial thickening. No acute bone finding. IMPRESSION: Previous CABG.  No evidence of heart failure by radiography. Bronchitis pattern.  Newly seen area of atelectasis in the lingula.  Electronically Signed   By: Paulina Fusi M.D.   On: 03/05/2019 15:19   A total of 40 minutes was spent in the room with the patient, greater than 50% of which was in counseling/coordination of care regarding differential diagnosis, treatment, new medications, prognosis, and need for follow-up in 2 weeks.  ED precautions given..   ASSESSMENT & PLAN: Heru was seen today for edema.  Diagnoses and all orders for this visit:  DOE (dyspnea on exertion) -     DG Chest 2 View; Future  Cellulitis of left knee -     cefadroxil (DURICEF) 500 MG capsule; Take 1 capsule (500 mg total) by mouth 2 (two) times daily for 7 days. -     cefTRIAXone (ROCEPHIN) injection 1 g  Pedal edema -     furosemide (LASIX) 20 MG tablet; Take 1 tablet (20 mg total) by mouth daily.  Essential hypertension  Chronic alcohol abuse     Patient Instructions       If you have lab work done today you will be contacted with your lab results within the next 2 weeks.  If you have not heard from [place] then please contact us. The fastest way to get your results is to register for My Chart.   IF you received an x-ray today, you will receive an invoice from [facility]. Please contact [facility] at [phone] with questions or concerns regarding your invoice.   IF you received labwork today, you will receive an invoice from [facility]. Please contact [facility] at [phone] with questions or concerns regarding your invoice.   Our billing staff will not be able to assist you with questions regarding bills from these companies.  You will be contacted with the lab results as soon as they are available.  The fastest way to get your results is to activate your My Chart account. Instructions are located on the last page of this paperwork. If you have not heard from [place] regarding the results in 2 weeks, please contact this office.      Edema  Edema is when you have too much fluid in your body or under  your skin. Edema may make your legs, feet, and ankles swell up. Swelling is also common in looser tissues, like around your eyes. This is a common condition. It gets more common as you get older. There are many possible causes of edema. Eating too much salt (sodium) and being on your feet or sitting for a long time can cause edema in your legs, feet, and ankles. Hot weather may make edema worse. Edema is usually painless. Your skin may look swollen or shiny. Follow these instructions at home:  Keep the swollen body part raised (elevated) above the level of your heart when you are sitting or lying down.  Do not sit still or stand for a long time.  Do not wear tight clothes. Do not wear garters on your upper legs.  Exercise your legs. This can help the swelling go down.  Wear elastic bandages or support stockings as told by your doctor.  Eat a low-salt (low-sodium) diet to reduce fluid as told by your doctor.  Depending on the cause of your swelling, you may need to limit how much fluid you drink (fluid restriction).  Take over-the-counter and prescription medicines only as told by your doctor. Contact a doctor if:  Treatment is not working.  You have heart, liver, or kidney disease and have symptoms of edema.  You have sudden and unexplained weight gain. Get help right away if:  You have shortness of breath or chest pain.  You cannot breathe when you lie down.  You have pain, redness, or warmth in the swollen areas.  You have heart, liver, or kidney disease and get edema all of a sudden.  You have a fever and your symptoms get worse all of a sudden. Summary  Edema is when you have too much fluid in your body or under your skin.  Edema may make your legs, feet, and ankles swell up. Swelling is also common in looser tissues, like around your eyes.  Raise (elevate) the swollen body part above the level of your heart when you are sitting or lying down.  Follow your doctor's  instructions about diet and how much fluid you can drink (fluid restriction). This information is not intended to replace advice given to you by your health care provider. Make sure you discuss any questions you have with your health care provider. Document Released: 05/03/2008 Document Revised: 12/03/2016 Document Reviewed: 12/03/2016 Elsevier Interactive Patient Education  2019 Elsevier Inc.  Cellulitis, Adult  Cellulitis is a skin infection. The infected area is often warm, red, swollen, and sore. It occurs most often in the arms and lower legs. It is very important to get treated for this condition. What are the causes? This condition is caused by bacteria. The bacteria enter through a break in the skin, such as a cut, burn, insect bite, open sore, or crack. What increases the risk? This condition is more likely to occur in people who:  Have a weak body defense system (immune system).  Have open cuts, burns, bites, or scrapes on the skin.  Are older than 72 years of age.  Have a blood sugar problem (diabetes).  Have a long-lasting (chronic) liver disease (cirrhosis) or kidney disease.  Are very overweight (obese).  Have a skin problem, such as: ? Itchy rash (eczema). ? Slow movement of blood in the veins (venous stasis). ? Fluid buildup below the skin (edema).  Have been treated with high-energy rays (radiation).  Use IV drugs. What are the signs or symptoms? Symptoms of this condition include:  Skin that is: ? Red. ? Streaking. ? Spotting. ? Swollen. ? Sore or painful when you touch it. ? Warm.  A fever.  Chills.  Blisters. How is this diagnosed? This condition is diagnosed based on:  Medical history.  Physical exam.  Blood tests.  Imaging tests. How is this treated? Treatment for this condition may include:  Medicines to treat infections or allergies.  Home care, such as: ? Rest. ? Placing cold or warm cloths (compresses) on the skin.   Hospital care, if the condition is very bad. Follow these instructions at home: Medicines  Take over-the-counter and prescription medicines only as told by your doctor.  If you were prescribed an antibiotic medicine, take it as told by your doctor. Do not stop taking it even if you start to feel better. General instructions   Drink enough fluid to keep your pee (urine) pale yellow.  Do not touch or rub the infected area.  Raise (elevate) the infected area above the level of your heart while you are sitting or lying down.  Place cold or warm cloths on the area as told by your doctor.  Keep all follow-up visits as told by your doctor. This is important. Contact a doctor if:  You have a fever.  You do not start to get better after 1-2 days of treatment.  Your bone or joint under the infected area starts to hurt after the skin has healed.  Your infection comes back. This can happen in the same area or another area.  You have a swollen bump in the area.  You have new symptoms.  You feel ill and have muscle aches and pains. Get help right away if:  Your symptoms get worse.  You feel very sleepy.  You throw up (vomit) or have watery poop (diarrhea) for a long time.  You see red streaks coming from the area.  Your red area gets larger.  Your red area turns dark in color. These symptoms may represent a serious problem that is an emergency. Do not wait to see if the symptoms will go away. Get medical help right away. Call your local emergency services (911 in the [place] Do not drive yourself to the hospital. Summary  Cellulitis is a skin infection. The area is often warm, red, swollen, and sore.  This condition is treated with medicines, rest, and cold and warm cloths.  Take all medicines only as told by your doctor.  Tell your doctor if symptoms do not start to get better after 1-2 days of treatment. This information is not intended to replace advice given to you by  your health care provider. Make sure you discuss any questions you have with your health care provider. Document Released: 05/03/2008 Document Revised: 04/06/2018 Document Reviewed: 04/06/2018 Elsevier Interactive Patient Education  2019 Elsevier Inc.      Edwina Barth, MD [facility]

## 2019-03-05 NOTE — Patient Instructions (Addendum)
If you have lab work done today you will be contacted with your lab results within the next 2 weeks.  If you have not heard from us then please contact us. The fastest way to get your results is to register for My Chart.   IF you received an x-ray today, you will receive an invoice from [facility]. Please contact [facility] at [phone] with questions or concerns regarding your invoice.   IF you received labwork today, you will receive an invoice from [facility]. Please contact [facility] at [phone] with questions or concerns regarding your invoice.   Our billing staff will not be able to assist you with questions regarding bills from these companies.  You will be contacted with the lab results as soon as they are available. The fastest way to get your results is to activate your My Chart account. Instructions are located on the last page of this paperwork. If you have not heard from us regarding the results in 2 weeks, please contact this office.      Edema  Edema is when you have too much fluid in your body or under your skin. Edema may make your legs, feet, and ankles swell up. Swelling is also common in looser tissues, like around your eyes. This is a common condition. It gets more common as you get older. There are many possible causes of edema. Eating too much salt (sodium) and being on your feet or sitting for a long time can cause edema in your legs, feet, and ankles. Hot weather may make edema worse. Edema is usually painless. Your skin may look swollen or shiny. Follow these instructions at home:  Keep the swollen body part raised (elevated) above the level of your heart when you are sitting or lying down.  Do not sit still or stand for a long time.  Do not wear tight clothes. Do not wear garters on your upper legs.  Exercise your legs. This can help the swelling go down.  Wear elastic bandages or support stockings as told by your doctor.  Eat a  low-salt (low-sodium) diet to reduce fluid as told by your doctor.  Depending on the cause of your swelling, you may need to limit how much fluid you drink (fluid restriction).  Take over-the-counter and prescription medicines only as told by your doctor. Contact a doctor if:  Treatment is not working.  You have heart, liver, or kidney disease and have symptoms of edema.  You have sudden and unexplained weight gain. Get help right away if:  You have shortness of breath or chest pain.  You cannot breathe when you lie down.  You have pain, redness, or warmth in the swollen areas.  You have heart, liver, or kidney disease and get edema all of a sudden.  You have a fever and your symptoms get worse all of a sudden. Summary  Edema is when you have too much fluid in your body or under your skin.  Edema may make your legs, feet, and ankles swell up. Swelling is also common in looser tissues, like around your eyes.  Raise (elevate) the swollen body part above the level of your heart when you are sitting or lying down.  Follow your doctor's instructions about diet and how much fluid you can drink (fluid restriction). This information is not intended to replace advice given to you by your health care provider. Make sure you discuss any questions you have with your health care provider.  Document Released: 05/03/2008 Document Revised: 12/03/2016 Document Reviewed: 12/03/2016 Elsevier Interactive Patient Education  2019 Elsevier Inc.  Cellulitis, Adult  Cellulitis is a skin infection. The infected area is often warm, red, swollen, and sore. It occurs most often in the arms and lower legs. It is very important to get treated for this condition. What are the causes? This condition is caused by bacteria. The bacteria enter through a break in the skin, such as a cut, burn, insect bite, open sore, or crack. What increases the risk? This condition is more likely to occur in people  who:  Have a weak body defense system (immune system).  Have open cuts, burns, bites, or scrapes on the skin.  Are older than 72 years of age.  Have a blood sugar problem (diabetes).  Have a long-lasting (chronic) liver disease (cirrhosis) or kidney disease.  Are very overweight (obese).  Have a skin problem, such as: ? Itchy rash (eczema). ? Slow movement of blood in the veins (venous stasis). ? Fluid buildup below the skin (edema).  Have been treated with high-energy rays (radiation).  Use IV drugs. What are the signs or symptoms? Symptoms of this condition include:  Skin that is: ? Red. ? Streaking. ? Spotting. ? Swollen. ? Sore or painful when you touch it. ? Warm.  A fever.  Chills.  Blisters. How is this diagnosed? This condition is diagnosed based on:  Medical history.  Physical exam.  Blood tests.  Imaging tests. How is this treated? Treatment for this condition may include:  Medicines to treat infections or allergies.  Home care, such as: ? Rest. ? Placing cold or warm cloths (compresses) on the skin.  Hospital care, if the condition is very bad. Follow these instructions at home: Medicines  Take over-the-counter and prescription medicines only as told by your doctor.  If you were prescribed an antibiotic medicine, take it as told by your doctor. Do not stop taking it even if you start to feel better. General instructions   Drink enough fluid to keep your pee (urine) pale yellow.  Do not touch or rub the infected area.  Raise (elevate) the infected area above the level of your heart while you are sitting or lying down.  Place cold or warm cloths on the area as told by your doctor.  Keep all follow-up visits as told by your doctor. This is important. Contact a doctor if:  You have a fever.  You do not start to get better after 1-2 days of treatment.  Your bone or joint under the infected area starts to hurt after the skin has  healed.  Your infection comes back. This can happen in the same area or another area.  You have a swollen bump in the area.  You have new symptoms.  You feel ill and have muscle aches and pains. Get help right away if:  Your symptoms get worse.  You feel very sleepy.  You throw up (vomit) or have watery poop (diarrhea) for a long time.  You see red streaks coming from the area.  Your red area gets larger.  Your red area turns dark in color. These symptoms may represent a serious problem that is an emergency. Do not wait to see if the symptoms will go away. Get medical help right away. Call your local emergency services (911 in the [place] Do not drive yourself to the hospital. Summary  Cellulitis is a skin infection. The area is often warm, red, swollen, and  sore.  This condition is treated with medicines, rest, and cold and warm cloths.  Take all medicines only as told by your doctor.  Tell your doctor if symptoms do not start to get better after 1-2 days of treatment. This information is not intended to replace advice given to you by your health care provider. Make sure you discuss any questions you have with your health care provider. Document Released: 05/03/2008 Document Revised: 04/06/2018 Document Reviewed: 04/06/2018 Elsevier Interactive Patient Education  2019 [employer].

## 2019-03-06 ENCOUNTER — Telehealth: Payer: Self-pay | Admitting: Interventional Cardiology

## 2019-03-06 NOTE — Telephone Encounter (Signed)
Pt seen PCP yesterday.  Note is in Epic. Had CXR as well.  Pt currently scheduled with you on 4/21 and will see PCP on 4/22.  Can you review and see if any further recommendations?

## 2019-03-06 NOTE — Telephone Encounter (Signed)
Needs virtual visit with vital signs and meds on hand.

## 2019-03-06 NOTE — Telephone Encounter (Signed)
Spoke to pt regarding his upcoming Virtual visit with Dr Katrinka BlazingSmith on Monday, 4/13. He does not use MyChart and is not interested in it. His Son-in-Law downloaded WebEx on his phone for him. I advised him to have vitals ready for when I call prior to his appt and I would walk him through getting connected to WebEx. Pt agreed to do this virtual visit.      Virtual Visit Pre-Appointment Phone Call  Steps For Call:  1. Confirm consent - "In the setting of the current Covid19 crisis, you are scheduled for a (phone or video) visit with your provider on (date) at (time).  Just as we do with many in-office visits, in order for you to participate in this visit, we must obtain consent.  If you'd like, I can send this to your mychart (if signed up) or email for you to review.  Otherwise, I can obtain your verbal consent now.  All virtual visits are billed to your insurance company just like a normal visit would be.  By agreeing to a virtual visit, we'd like you to understand that the technology does not allow for your provider to perform an examination, and thus may limit your provider's ability to fully assess your condition.  Finally, though the technology is pretty good, we cannot assure that it will always work on either your or our end, and in the setting of a video visit, we may have to convert it to a phone-only visit.  In either situation, we cannot ensure that we have a secure connection.  Are you willing to proceed?"  2. Give patient instructions for WebEx download to smartphone as below if video visit  3. Advise patient to be prepared with any vital sign or heart rhythm information, their current medicines, and a piece of paper and pen handy for any instructions they may receive the day of their visit  4. Inform patient they will receive a phone call 15 minutes prior to their appointment time (may be from unknown caller ID) so they should be prepared to answer  5. Confirm that appointment type is  correct in Epic appointment notes (video vs telephone)    TELEPHONE CALL NOTE  [name] has been deemed a candidate for a follow-up tele-health visit to limit community exposure during the Covid-19 pandemic. I spoke with the patient via phone to ensure availability of phone/video source, confirm preferred email & phone number, and discuss instructions and expectations.  I reminded [name] to be prepared with any vital sign and/or heart rhythm information that could potentially be obtained via home monitoring, at the time of his visit. I reminded [name] to expect a phone call at the time of his visit if his visit.  Did the patient verbally acknowledge consent to treatment? Yes  Rhea Thrun, [facility] CenterCMA 03/06/2019 4:41 PM   DOWNLOADING THE WEBEX SOFTWARE TO SMARTPHONE  - If Apple, go to [employer] Store and type in WebEx in the search bar. Download [employer] Meetings, the blue/green circle. The app is free but as with any other app downloads, their phone may require them to verify saved payment information or Apple password. The patient does NOT have to create an account.  - If Android, ask patient to go to [employer] Play Store and type in WebEx in the search bar. Download [employer] Meetings, the blue/green circle. The app is free but as with any other app downloads, their phone may require them to verify  saved payment information or Android password. The patient does NOT have to create an account.   CONSENT FOR TELE-HEALTH VISIT - PLEASE REVIEW  I hereby voluntarily request, consent and authorize [facility] and its employed or contracted physicians, physician assistants, nurse practitioners or other licensed health care professionals (the Practitioner), to provide me with telemedicine health care services (the "Services") as deemed necessary by the treating Practitioner. I acknowledge and consent to receive the Services by the Practitioner via telemedicine. I understand that  the telemedicine visit will involve communicating with the Practitioner through live audiovisual communication technology and the disclosure of certain medical information by electronic transmission. I acknowledge that I have been given the opportunity to request an in-person assessment or other available alternative prior to the telemedicine visit and am voluntarily participating in the telemedicine visit.  I understand that I have the right to withhold or withdraw my consent to the use of telemedicine in the course of my care at any time, without affecting my right to future care or treatment, and that the Practitioner or I may terminate the telemedicine visit at any time. I understand that I have the right to inspect all information obtained and/or recorded in the course of the telemedicine visit and may receive copies of available information for a reasonable fee.  I understand that some of the potential risks of receiving the Services via telemedicine include:  Marland Kitchen Delay or interruption in medical evaluation due to technological equipment failure or disruption; . Information transmitted may not be sufficient (e.g. poor resolution of images) to allow for appropriate medical decision making by the Practitioner; and/or  . In rare instances, security protocols could fail, causing a breach of personal health information.  Furthermore, I acknowledge that it is my responsibility to provide information about my medical history, conditions and care that is complete and accurate to the best of my ability. I acknowledge that Practitioner's advice, recommendations, and/or decision may be based on factors not within their control, such as incomplete or inaccurate data provided by me or distortions of diagnostic images or specimens that may result from electronic transmissions. I understand that the practice of medicine is not an exact science and that Practitioner makes no warranties or guarantees regarding treatment  outcomes. I acknowledge that I will receive a copy of this consent concurrently upon execution via email to the email address I last provided but may also request a printed copy by calling the office of [facility].    I understand that my insurance will be billed for this visit.   I have read or had this consent read to me. . I understand the contents of this consent, which adequately explains the benefits and risks of the Services being provided via telemedicine.  . I have been provided ample opportunity to ask questions regarding this consent and the Services and have had my questions answered to my satisfaction. . I give my informed consent for the services to be provided through the use of telemedicine in my medical care  By participating in this telemedicine visit I agree to the above.

## 2019-03-06 NOTE — Telephone Encounter (Signed)
Pt went to his PCP yesterday. The PCP said the pt had fluid in his lungs and his legs and feet were swollen. The PCP put the pt on Lasix 20mg  1x daily.  Pt was told to get in touch with his Cardiologist to make him aware of the new medication, as well as the fluid on his lungs.  Pt hopes Dr. Katrinka Blazing can re-evaluate his medications and decide what is best for the pt

## 2019-03-07 ENCOUNTER — Telehealth: Payer: Medicare Other | Admitting: Interventional Cardiology

## 2019-03-08 ENCOUNTER — Other Ambulatory Visit: Payer: Self-pay

## 2019-03-08 ENCOUNTER — Encounter (HOSPITAL_COMMUNITY): Payer: Self-pay

## 2019-03-08 ENCOUNTER — Emergency Department (HOSPITAL_COMMUNITY): Payer: Medicare Other

## 2019-03-08 ENCOUNTER — Telehealth: Payer: Self-pay | Admitting: Emergency Medicine

## 2019-03-08 ENCOUNTER — Emergency Department (HOSPITAL_COMMUNITY)
Admission: EM | Admit: 2019-03-08 | Discharge: 2019-03-08 | Disposition: A | Discharge status: Home / Self Care | Payer: Medicare Other | Attending: Emergency Medicine | Admitting: Emergency Medicine

## 2019-03-08 ENCOUNTER — Telehealth: Payer: Self-pay | Admitting: Interventional Cardiology

## 2019-03-08 DIAGNOSIS — I2510 Atherosclerotic heart disease of native coronary artery without angina pectoris: Secondary | ICD-10-CM | POA: Insufficient documentation

## 2019-03-08 DIAGNOSIS — R05 Cough: Secondary | ICD-10-CM

## 2019-03-08 DIAGNOSIS — R609 Edema, unspecified: Secondary | ICD-10-CM | POA: Insufficient documentation

## 2019-03-08 DIAGNOSIS — Z79899 Other long term (current) drug therapy: Secondary | ICD-10-CM | POA: Diagnosis not present

## 2019-03-08 DIAGNOSIS — I10 Essential (primary) hypertension: Secondary | ICD-10-CM | POA: Diagnosis not present

## 2019-03-08 DIAGNOSIS — F1010 Alcohol abuse, uncomplicated: Secondary | ICD-10-CM | POA: Diagnosis not present

## 2019-03-08 DIAGNOSIS — R062 Wheezing: Secondary | ICD-10-CM | POA: Insufficient documentation

## 2019-03-08 DIAGNOSIS — R059 Cough, unspecified: Secondary | ICD-10-CM

## 2019-03-08 DIAGNOSIS — R0602 Shortness of breath: Secondary | ICD-10-CM | POA: Insufficient documentation

## 2019-03-08 DIAGNOSIS — Z87891 Personal history of nicotine dependence: Secondary | ICD-10-CM | POA: Insufficient documentation

## 2019-03-08 DIAGNOSIS — M7989 Other specified soft tissue disorders: Secondary | ICD-10-CM | POA: Diagnosis not present

## 2019-03-08 LAB — CBC WITH DIFFERENTIAL/PLATELET
Abs Immature Granulocytes: 0 10*3/uL (ref 0.00–0.07)
Basophils Absolute: 0.1 10*3/uL (ref 0.0–0.1)
Basophils Relative: 1 %
Eosinophils Absolute: 0.1 10*3/uL (ref 0.0–0.5)
Eosinophils Relative: 2 %
HCT: 36.3 % — ABNORMAL LOW (ref 39.0–52.0)
Hemoglobin: 11.6 g/dL — ABNORMAL LOW (ref 13.0–17.0)
Immature Granulocytes: 0 %
Lymphocytes Relative: 36 %
Lymphs Abs: 1.6 10*3/uL (ref 0.7–4.0)
MCH: 30.4 pg (ref 26.0–34.0)
MCHC: 32 g/dL (ref 30.0–36.0)
MCV: 95 fL (ref 80.0–100.0)
Monocytes Absolute: 0.4 10*3/uL (ref 0.1–1.0)
Monocytes Relative: 9 %
Neutro Abs: 2.3 10*3/uL (ref 1.7–7.7)
Neutrophils Relative %: 52 %
Platelets: 161 10*3/uL (ref 150–400)
RBC: 3.82 MIL/uL — ABNORMAL LOW (ref 4.22–5.81)
RDW: 13.7 % (ref 11.5–15.5)
WBC: 4.4 10*3/uL (ref 4.0–10.5)
nRBC: 0 % (ref 0.0–0.2)

## 2019-03-08 LAB — COMPREHENSIVE METABOLIC PANEL
ALT: 40 U/L (ref 0–44)
AST: 53 U/L — ABNORMAL HIGH (ref 15–41)
Albumin: 3.4 g/dL — ABNORMAL LOW (ref 3.5–5.0)
Alkaline Phosphatase: 45 U/L (ref 38–126)
Anion gap: 10 (ref 5–15)
BUN: 12 mg/dL (ref 8–23)
CO2: 26 mmol/L (ref 22–32)
Calcium: 9.5 mg/dL (ref 8.9–10.3)
Chloride: 100 mmol/L (ref 98–111)
Creatinine, Ser: 1.13 mg/dL (ref 0.61–1.24)
GFR calc Af Amer: >60 mL/min (ref >60)
GFR calc non Af Amer: >60 mL/min (ref >60)
Glucose, Bld: 106 mg/dL — ABNORMAL HIGH (ref 70–99)
Potassium: 3.6 mmol/L (ref 3.5–5.1)
Sodium: 136 mmol/L (ref 135–145)
Total Bilirubin: 0.9 mg/dL (ref 0.3–1.2)
Total Protein: 8.4 g/dL — ABNORMAL HIGH (ref 6.5–8.1)

## 2019-03-08 LAB — BRAIN NATRIURETIC PEPTIDE: B Natriuretic Peptide: 295.2 pg/mL — ABNORMAL HIGH (ref 0.0–100.0)

## 2019-03-08 LAB — TROPONIN I: Troponin I: <0.03 ng/mL (ref <0.03)

## 2019-03-08 MED ORDER — FUROSEMIDE 10 MG/ML IJ SOLN
40.0000 mg | Freq: Once | INTRAMUSCULAR | Status: AC
  Administered 2019-03-08: 40 mg via INTRAVENOUS
  Filled 2019-03-08: qty 4

## 2019-03-08 MED ORDER — ALBUTEROL SULFATE HFA 108 (90 BASE) MCG/ACT IN AERS: 2.0000 | INHALATION_SPRAY | RESPIRATORY_TRACT | 0 refills | Status: DC | PRN

## 2019-03-08 NOTE — ED Provider Notes (Signed)
[facility] EMERGENCY DEPARTMENT Provider Note   CSN: [id number] Arrival date & time: 03/08/19  1127    History   Chief Complaint No chief complaint on file.   HPI [name] is a 73 y.o. male.     Patient is a 73 year old African-American male with past medical history of hypertension, depression, coronary artery disease, heavy alcohol use who presents the emergency department for lower extremity edema, cough, shortness of breath.  Patient reports that the shortness of breath is worse with exertion and reports cough is worse with lying flat. Reports edema has been going on for "a while".   Patient was recently admitted to the hospital at the end of March for upper GI bleed as well as cough and flu symptoms.  COVID-19 testing at that time was negative.  Reports that he has had ongoing edema in his legs as well as cough and shortness of breath since then.  Reports 4 days ago he talked to his primary care doctor about this and was started on daily 20 mg of Lasix.  He reports that symptoms seem to be getting worse.  Denies any chest pain.  Reports that he is coughing up green mucus.  Denies any more fevers.  He had an echo in 2015 which revealed an ejection fraction of 50 to 55%.  Of note, he has a remote history of hepatitis C, he had treatment in the past for this.  Recently he had hepatitis panel drawn during his admission stay due to elevated LFTs.  Patient had HCV antibody positive.  Likely a reflection of past hepatitis C infection.  Also had hepatitis B surface antibody positive but negative IgG and negative HBsAg. Denies alcohol use in 3 weeks     Past Medical History:  Diagnosis Date   Anginal pain (HCC) 01/2005   Anxiety    Arthritis    "knees"   Chronic lower back pain    Coronary artery disease    Depression    Hepatitis    "don't know what kind; they treated me for it; was a long time ago" (12/11/2018)   High cholesterol    History of  bronchitis    "used to get it alot" (08/25/2012)   Hypertension    Panic attacks    PTSD (post-traumatic stress disorder)    "have been treated in the past" (08/25/2012)    Patient Active Problem List   Diagnosis Date Noted   DOE (dyspnea on exertion) 03/05/2019   Cellulitis of left knee 03/05/2019   Thrombocytopenia (HCC) 02/24/2019   Abnormal liver function tests 02/24/2019   Hypertensive urgency 12/11/2018   Chronic alcohol abuse 12/09/2015   Left knee pain 12/02/2015   Coronary artery disease involving coronary bypass graft of native heart without angina pectoris 08/07/2014   Essential hypertension 08/07/2014   Hyperlipidemia 08/07/2014    Past Surgical History:  Procedure Laterality Date   CARDIAC CATHETERIZATION  01/2005   CORONARY ARTERY BYPASS GRAFT  01/2005   CABG X3   Shrapnel Left 1969   LLE; left lateral thumb (required grafting)   VARICOSE VEIN SURGERY  1980's   LLE        Home Medications    Prior to Admission medications   Medication Sig Start Date End Date Taking? Authorizing Provider  albuterol (PROVENTIL HFA;VENTOLIN HFA) 108 (90 Base) MCG/ACT inhaler Inhale 2 puffs into the lungs every 4 (four) hours as needed for up to 30 days for wheezing or shortness of breath.  03/08/19 04/07/19  Ronnie Doss A, PA-C  cefadroxil (DURICEF) 500 MG capsule Take 1 capsule (500 mg total) by mouth 2 (two) times daily for 7 days. 03/05/19 03/12/19  Georgina Quint, MD  FLUoxetine (PROZAC) 20 MG capsule Take 20 mg by mouth daily.    [provider]  folic acid (FOLVITE) 1 MG tablet Take 1 tablet (1 mg total) by mouth daily. 01/24/19   Georgina Quint, MD  furosemide (LASIX) 20 MG tablet Take 1 tablet (20 mg total) by mouth daily. 03/05/19   Georgina Quint, MD  losartan (COZAAR) 25 MG tablet Take 1 tablet (25 mg total) by mouth daily. 01/24/19 04/24/19  Georgina Quint, MD  metoprolol succinate (TOPROL-XL) 25 MG 24 hr tablet Take 1 tablet  (25 mg total) by mouth daily. 04/25/18   Filbert Schilder, NP  nitroGLYCERIN (NITROSTAT) 0.4 MG SL tablet Place 1 tablet (0.4 mg total) under the tongue every 5 (five) minutes as needed for chest pain. 01/24/19   Georgina Quint, MD  pantoprazole (PROTONIX) 40 MG tablet Take 1 tablet (40 mg total) by mouth 2 (two) times daily. 02/26/19   Lanelle Bal, MD  thiamine 100 MG tablet Take 1 tablet (100 mg total) by mouth daily. 01/24/19   Georgina Quint, MD    Family History Family History  Problem Relation Age of Onset   Diabetes Mother    Heart disease Father    Cancer Brother     Social History Social History   Tobacco Use   Smoking status: Former Smoker    Packs/day: 2.00    Years: 5.00    Pack years: 10.00   Smokeless tobacco: Never Used   Tobacco comment: 08/25/2012 "quit smoking cigarettes 40 yr ago"  Substance Use Topics   Alcohol use: Yes    Alcohol/week: 60.0 standard drinks    Types: 60 Standard drinks or equivalent per week    Comment: 12/11/2018 "~ 1 pint of vodka/day"   Drug use: Yes    Types: Marijuana    Comment: 08/25/2012 "last marijuana 10 years ago"     Allergies   Codeine and Vicodin [hydrocodone-acetaminophen]   Review of Systems Review of Systems  Constitutional: Negative for chills and fever.  HENT: Negative for ear pain, rhinorrhea, sinus pain and sore throat.   Eyes: Negative for pain and visual disturbance.  Respiratory: Positive for cough and shortness of breath. Negative for apnea, chest tightness, wheezing and stridor.   Cardiovascular: Positive for leg swelling. Negative for chest pain and palpitations.  Gastrointestinal: Negative for abdominal pain, nausea and vomiting.  Genitourinary: Negative for dysuria and hematuria.  Musculoskeletal: Negative for arthralgias and back pain.  Skin: Negative for color change and rash.  Neurological: Negative for dizziness, seizures and syncope.  All other systems reviewed and are  negative.    Physical Exam Updated Vital Signs BP (!) 104/53    Pulse (!) 55    Temp 97.7 F (36.5 C) (Oral)    Resp 18    Ht  (1.778 m)    Wt 112.9 kg    SpO2 95%    BMI 35.73 kg/m   Physical Exam Vitals signs and nursing note reviewed.  Constitutional:      Appearance: He is well-developed.  HENT:     Head: Normocephalic and atraumatic.     Nose: Nose normal.     Mouth/Throat:     Mouth: Mucous membranes are moist.  Eyes:     Conjunctiva/sclera:  Conjunctivae normal.  Neck:     Musculoskeletal: Neck supple.  Cardiovascular:     Rate and Rhythm: Normal rate and regular rhythm.     Heart sounds: No murmur.  Pulmonary:     Effort: Pulmonary effort is normal.     Comments: Bilateral rales at the bases and bilateral wheezing throughout. Abdominal:     Palpations: Abdomen is soft.     Tenderness: There is no abdominal tenderness.  Musculoskeletal:     Right lower leg: Edema present.     Left lower leg: Edema present.     Comments: Bilateral lower 3+ pitting edema.  Skin:    General: Skin is warm and dry.  Neurological:     General: No focal deficit present.     Mental Status: He is alert.  Psychiatric:        Mood and Affect: Mood normal.      ED Treatments / Results  Labs (all labs ordered are listed, but only abnormal results are displayed) Labs Reviewed  CBC WITH DIFFERENTIAL/PLATELET - Abnormal; Notable for the following components:      Result Value   RBC 3.82 (*)    Hemoglobin 11.6 (*)    HCT 36.3 (*)    All other components within normal limits  BRAIN NATRIURETIC PEPTIDE - Abnormal; Notable for the following components:   B Natriuretic Peptide 295.2 (*)    All other components within normal limits  COMPREHENSIVE METABOLIC PANEL - Abnormal; Notable for the following components:   Glucose, Bld 106 (*)    Total Protein 8.4 (*)    Albumin 3.4 (*)    AST 53 (*)    All other components within normal limits  TROPONIN I     EKG None  Radiology Dg Chest Port 1 View  Result Date: 03/08/2019 CLINICAL DATA:  Short of breath with productive cough for 8 days. Previous smoker. EXAM: PORTABLE CHEST 1 VIEW COMPARISON:  03/05/2019 FINDINGS: Changes from CABG surgery are stable. Cardiac silhouette is mildly enlarged. No mediastinal or hilar masses. No evidence of adenopathy. Discoid type opacity noted in the left upper lobe lingula, stable from the most recent prior exam, consistent with atelectasis. Lungs otherwise clear. No pleural effusion or pneumothorax. Skeletal structures are grossly intact. IMPRESSION: 1. No acute cardiopulmonary disease. 2. Discoid atelectasis in the left upper lobe lingula, stable from the most recent prior exam. Electronically Signed   By: Amie Portlandavid  Ormond M.D.   On: 03/08/2019 12:09    Procedures Procedures (including critical care time)  Medications Ordered in ED Medications  furosemide (LASIX) injection 40 mg (40 mg Intravenous Given 03/08/19 1310)     Initial Impression / Assessment and Plan / ED Course  I have reviewed the triage vital signs and the nursing notes.  Pertinent labs & imaging results that were available during my care of the patient were reviewed by me and considered in my medical decision making (see chart for details).  Clinical Course as of Mar 07 1440  Thu Mar 08, 2019  1215  Differential includes CHF, ascites, hospital-acquired or aspiration pneumonia contracted during his recent admission for hematemesis.  He is not having any active chest pain.   [KM]  1417 Patient's chest x-ray is clear, BNP is 295.  Troponin was negative and EKG unremarkable.  This may volume overload in the context of recent admission and lots of fluid.  However, if it was it is improving with the Lasix.  He also has wheezing on exam.  Previous x-ray showing bronchitis type picture.  Patient had good urine output with IV Lasix.  He reports after the Lasix he is feeling much better.  He was up and  ambulated and oxygen did not drop and he was asymptomatic.  Patient was advised to continue his Lasix at home.  I will also start him on an inhaler.  Patient was offered admission but reports he would rather go home if he can.  I think this is reasonable since he is not hypoxic and can have continued outpatient follow-up with his cardiologist.  States that he will call his cardiologist as soon as possible.   [KM]    Clinical Course User Index [KM] Arlyn Dunning, PA-C       Based on review of vitals, medical screening exam, lab work and/or imaging, there does not appear to be an acute, emergent etiology for the patient's symptoms. Counseled pt on good return precautions and encouraged both PCP and ED follow-up as needed.  Prior to discharge, I also discussed incidental imaging findings with patient in detail and advised appropriate, recommended follow-up in detail.  Clinical Impression: 1. Cough   2. Wheeze     Disposition: Discharge  Prior to providing a prescription for a controlled substance, I independently reviewed the patient's recent prescription history on the [place] Controlled Substance Reporting System. The patient had no recent or regular prescriptions and was deemed appropriate for a brief, less than 3 day prescription of narcotic for acute analgesia.  This note was prepared with assistance of Conservation officer, historic buildings. Occasional wrong-word or sound-a-like substitutions may have occurred due to the inherent limitations of voice recognition software.   Final Clinical Impressions(s) / ED Diagnoses   Final diagnoses:  Cough  Wheeze    ED Discharge Orders         Ordered    albuterol (PROVENTIL HFA;VENTOLIN HFA) 108 (90 Base) MCG/ACT inhaler  Every 4 hours PRN     03/08/19 1427           Jeral Pinch 03/08/19 1441    Arby Barrette, MD 03/17/19 2111

## 2019-03-08 NOTE — Telephone Encounter (Signed)
This sounds appropriate.  Difficult to tell if shortness of breath is volume overload or something other.

## 2019-03-08 NOTE — ED Notes (Signed)
Patient verbalizes understanding of discharge instructions. Opportunity for questioning and answers were provided. Armband removed by staff, pt discharged from ED.  

## 2019-03-08 NOTE — Discharge Instructions (Addendum)
Follow-up with your regular doctor as well as your cardiologist in the next few days.  Continue your Lasix and use the albuterol inhaler if you are coughing more short of breath. Thank you for allowing me to care for you today. Please return to the emergency department if you have new or worsening symptoms. Take your medications as instructed.

## 2019-03-08 NOTE — Telephone Encounter (Signed)
Spoke to pt and he stated that he feels that his legs are swelling even more. The lasix is not helping as much as he hoped it would. The swelling in his feet has gone down some. However he feels build up in the lungs and feels like there is something on his chest when he lays down.   I have informed him if he is having worsing symptoms to go to the urgent care or ED multiple times. He says he will go to an urgent care.   He did have the questions on how much water he can drink a day and what he should do about the continues swelling.   Thanks, [employer]

## 2019-03-08 NOTE — ED Notes (Signed)
One unsuccessful IV attempt, the second attempt flashed but the pt wanted it removed before I could flush and confirm patency.

## 2019-03-08 NOTE — ED Notes (Signed)
Pt ambulated in hallway on pulse ox. O2 saturation stayed between 94-100%. No complaints from the pt, gait steady.

## 2019-03-08 NOTE — ED Provider Notes (Addendum)
Medical screening examination/treatment/procedure(s) were conducted as a shared visit with non-physician practitioner(s) and myself.  I personally evaluated the patient during the encounter.  None    Arby Barrette, MD 03/08/19 1438 Patient has multiple medical comorbidities.  He reports shortness of breath.  He also is getting swelling in his legs.  He had Lasix 20 mg daily started approximately 4 days ago without improvement.  Patient is alert and appropriate.  Kolls at lung bases bilaterally.  Heart regular.  Lower extremity pitting edema bilaterally.  Diagnostic results reviewed.  Findings are stable without suggestion of significant CHF.  Patient has been given Lasix IV with good urine output.  I do feel he is stable for continued home management.  Agree with plan of management.   Arby Barrette, MD 03/17/19 2111

## 2019-03-08 NOTE — Telephone Encounter (Signed)
Copied from CRM [id number]. Topic: Quick Communication - See Telephone Encounter >> Mar 08, 2019  9:54 AM Louie Bun, Rosey Bath D wrote: CRM for notification. See Telephone encounter for: 03/08/19. Patient called stating that he needed to talk to someone in the office regarding fluid that he is accumulating. Patient stated that if he didn't talk to someone in the office he might die. Called office and front desk person took the call.

## 2019-03-08 NOTE — Telephone Encounter (Signed)
Pt called PCP about same issues.  Read note and PCP suggested UC or ED.  Spoke with pt and he states that he still has swelling in legs but cough and breathing has worsened.  Has SOB when lying down.  Has not weighed himself.  States he has had increased urination since starting Furosemide but feels like things are worsening.  Advised pt PCP said UC OR ED.  Pt will proceed to ED for evaluation.  Advised I will send to Dr. Katrinka Blazing to make him aware.

## 2019-03-08 NOTE — Telephone Encounter (Signed)
Needs to go to urgent care center or emergency department.  Thanks.

## 2019-03-08 NOTE — ED Triage Notes (Signed)
Pt placed on lasix four days ago and is still retaining fluid. Pt is coughing.

## 2019-03-08 NOTE — Telephone Encounter (Signed)
Patient is calling stating he feels the fluid still building up in his lungs.  He is still coughing a lot.  He has been taking the lasix as prescribed he said it has helped but his still feels fluid in his lungs.

## 2019-03-09 ENCOUNTER — Ambulatory Visit: Payer: Self-pay | Admitting: Emergency Medicine

## 2019-03-09 NOTE — Telephone Encounter (Signed)
Please advise patient if able if not he will need a tele med.

## 2019-03-09 NOTE — Telephone Encounter (Signed)
Pt. Reports he was seen in ED yesterday  For swelling to lower extremities and shortness of breath. Felt better after treatment and was sent home. Slept well last night and all the swelling was gone. After he ate breakfast and took his medications his feet started "getting puffy again. I think it could be one of my medicines." No chest pain or shortness of breath. Also can not swallow the antibiotics he was given. Please advise pt. Answer Assessment - Initial Assessment Questions 1. ONSET: "When did the swelling start?" (e.g., minutes, hours, days)     Started this morning 2. LOCATION: "What part of the leg is swollen?"  "Are both legs swollen or just one leg?"     Both feet 3. SEVERITY: "How bad is the swelling?" (e.g., localized; mild, moderate, severe)  - Localized - small area of swelling localized to one leg  - MILD pedal edema - swelling limited to foot and ankle, pitting edema < 1/4 inch (6 mm) deep, rest and elevation eliminate most or all swelling  - MODERATE edema - swelling of lower leg to knee, pitting edema > 1/4 inch (6 mm) deep, rest and elevation only partially reduce swelling  - SEVERE edema - swelling extends above knee, facial or hand swelling present      Mild 4. REDNESS: "Does the swelling look red or infected?"     A little red 5. PAIN: "Is the swelling painful to touch?" If so, ask: "How painful is it?"   (Scale 1-10; mild, moderate or severe)     1 6. FEVER: "Do you have a fever?" If so, ask: "What is it, how was it measured, and when did it start?"      No 7. CAUSE: "What do you think is causing the leg swelling?"     Unsure 8. MEDICAL HISTORY: "Do you have a history of heart failure, kidney disease, liver failure, or cancer?"     No 9. RECURRENT SYMPTOM: "Have you had leg swelling before?" If so, ask: "When was the last time?" "What happened that time?"     Yes - ED yesterday 10. OTHER SYMPTOMS: "Do you have any other symptoms?" (e.g., chest pain, difficulty  breathing)       No 11. PREGNANCY: "Is there any chance you are pregnant?" "When was your last menstrual period?"       n/a  Protocols used: LEG SWELLING AND EDEMA-A-AH

## 2019-03-11 NOTE — Telephone Encounter (Signed)
Needs telemed visit

## 2019-03-11 NOTE — Progress Notes (Signed)
Virtual Visit via Video Note   This visit type was conducted due to national recommendations for restrictions regarding the COVID-19 Pandemic (e.g. social distancing) in an effort to limit this patient's exposure and mitigate transmission in our community.  Due to his co-morbid illnesses, this patient is at least at moderate risk for complications without adequate follow up.  This format is felt to be most appropriate for this patient at this time.  All issues noted in this document were discussed and addressed.  A limited physical exam was performed with this format.  Please refer to the patient's chart for his consent to telehealth for [facility].   Evaluation Performed:  Follow-up visit  Date:  03/12/2019   ID:  Ronald MackieJoseph M Horrigan, DOB 11/18/1946, MRN [id number]  Patient Location: Home  Provider Location: Office  PCP:  Georgina QuintSagardia, [name], MD  Cardiologist:  Lesleigh NoeHenry W Smith III, MD  Electrophysiologist:  None   Chief Complaint:  CAD  History of Present Illness:    Babs SciaraJoseph M Ganci is a 73 y.o. male who presents via audio/video conferencing for a telehealth visit today.    He has a history of CAD s/p CABG x3, with early graft failure and re-do surgery (2006), HTN, HLD, PTSD, and obesity who presents today with chest pain.  He was diagnosed as having fluid in his lungs.  We discussed this in terms of diastolic congestive heart failure.  We discussed the etiology which in his case could include diabetes, coronary disease, and hypertension.  He is no longer smoking.  He is compliant with his medical regimen.  He currently has no lower extremity swelling.  The patient does not have symptoms concerning for COVID-19 infection (fever, chills, cough, or new shortness of breath).    Past Medical History:  Diagnosis Date  . Anginal pain (HCC) 01/2005  . Anxiety   . Arthritis    "knees"  . Chronic lower back pain   . Coronary artery disease   . Depression   . Hepatitis    "don't know  what kind; they treated me for it; was a long time ago" (12/11/2018)  . High cholesterol   . History of bronchitis    "used to get it alot" (08/25/2012)  . Hypertension   . Panic attacks   . PTSD (post-traumatic stress disorder)    "have been treated in the past" (08/25/2012)   Past Surgical History:  Procedure Laterality Date  . CARDIAC CATHETERIZATION  01/2005  . CORONARY ARTERY BYPASS GRAFT  01/2005   CABG X3  . Shrapnel Left 1969   LLE; left lateral thumb (required grafting)  . VARICOSE VEIN SURGERY  1980's   LLE     Current Meds  Medication Sig  . albuterol (PROVENTIL HFA;VENTOLIN HFA) 108 (90 Base) MCG/ACT inhaler Inhale 2 puffs into the lungs every 4 (four) hours as needed for up to 30 days for wheezing or shortness of breath.  Marland Kitchen. FLUoxetine (PROZAC) 20 MG capsule Take 20 mg by mouth daily.  . folic acid (FOLVITE) 1 MG tablet Take 1 tablet (1 mg total) by mouth daily.  . furosemide (LASIX) 20 MG tablet Take 1 tablet (20 mg total) by mouth daily.  Marland Kitchen. losartan (COZAAR) 25 MG tablet Take 1 tablet (25 mg total) by mouth daily.  . metoprolol succinate (TOPROL-XL) 25 MG 24 hr tablet Take 1 tablet (25 mg total) by mouth daily.  . nitroGLYCERIN (NITROSTAT) 0.4 MG SL tablet Place 1 tablet (0.4 mg total) under the  tongue every 5 (five) minutes as needed for chest pain.  . pantoprazole (PROTONIX) 40 MG tablet Take 1 tablet (40 mg total) by mouth 2 (two) times daily.  . [DISCONTINUED] furosemide (LASIX) 20 MG tablet Take 1 tablet (20 mg total) by mouth daily.     Allergies:   Codeine and Vicodin [hydrocodone-acetaminophen]   Social History   Tobacco Use  . Smoking status: Former Smoker    Packs/day: 2.00    Years: 5.00    Pack years: 10.00  . Smokeless tobacco: Never Used  . Tobacco comment: 08/25/2012 "quit smoking cigarettes 40 yr ago"  Substance Use Topics  . Alcohol use: Yes    Alcohol/week: 60.0 standard drinks    Types: 60 Standard drinks or equivalent per week    Comment:  12/11/2018 "~ 1 pint of vodka/day"  . Drug use: Yes    Types: Marijuana    Comment: 08/25/2012 "last marijuana 10 years ago"     Family Hx: The patient's family history includes Cancer in his brother; Diabetes in his mother; Heart disease in his father.  ROS:   Please see the history of present illness.    He has had significant difficulty with alcohol abuse.  Not currently drinking.  He has been laid off from work due to the COVID-19 epidemic.  He is sleeping well.  He is gaining weight.  And limited salt in his diet. All other systems reviewed and are negative.   Prior CV studies:   The following studies were reviewed today:  Recent chest x-ray did not reveal volume overload.  The x-ray was personally reviewed and was performed on 03/08/2019.  Labs/Other Tests and Data Reviewed:    EKG:  An ECG dated 03/08/2019 was personally reviewed today and demonstrated:  Demonstrated normal sinus rhythm with normal appearance.  No evidence of ischemia.  Recent Labs: 03/08/2019: ALT 40; B Natriuretic Peptide 295.2; BUN 12; Creatinine, Ser 1.13; Hemoglobin 11.6; Platelets 161; Potassium 3.6; Sodium 136   Recent Lipid Panel Lab Results  Component Value Date/Time   CHOL 251 (H) 08/26/2012 05:00 AM   TRIG 282 (H) 08/26/2012 05:00 AM   HDL 36 (L) 08/26/2012 05:00 AM   CHOLHDL 7.0 08/26/2012 05:00 AM   LDLCALC 159 (H) 08/26/2012 05:00 AM    Wt Readings from Last 3 Encounters:  03/12/19 239 lb (108.4 kg)  03/08/19 249 lb (112.9 kg)  03/05/19 249 lb 6.4 oz (113.1 kg)     Objective:    Vital Signs:  BP 130/76   Pulse (!) 56   Ht  (1.778 m)   Wt 239 lb (108.4 kg)   BMI 34.29 kg/m    Well nourished, well developed male in no acute distress. Moderate facial obesity.  No neck vein distention.  Hands did not reveal any evidence of cyanosis.  Trace bilateral ankle edema.  Respiratory pattern is normal.  ASSESSMENT & PLAN:    1. Coronary artery disease involving coronary bypass graft of  native heart without angina pectoris   2. Essential hypertension   3. Other hyperlipidemia   4. Pedal edema   5. Chronic diastolic heart failure (HCC)   6. Educated About Covid-19 Virus Infection    Plan in order of problem: 1. Stable without obvious angina.  We rediscussed secondary prevention.  He needs more physical activity. 2. Blood pressure is at target less than 130/80 mmHg. 3. LDL target is 70.  He is not currently taking statins because of intolerance. 4. Edema has now  resolved and is due to problem #5 5. Plan to continue furosemide 20 mg/day.  He needs to have a basic metabolic panel performed in 4 to 12 weeks to ensure volume status is stable.  COVID-19 Education: The signs and symptoms of COVID-19 were discussed with the patient and how to seek care for testing (follow up with PCP or arrange E-visit).  The importance of social distancing was discussed today.  Time:   Today, I have spent 15 minutes with the patient with telehealth technology discussing the above problems.     Medication Adjustments/Labs and Tests Ordered: Current medicines are reviewed at length with the patient today.  Concerns regarding medicines are outlined above.  Tests Ordered: Orders Placed This Encounter  Procedures  . Basic metabolic panel   Medication Changes: Meds ordered this encounter  Medications  . furosemide (LASIX) 20 MG tablet    Sig: Take 1 tablet (20 mg total) by mouth daily.    Dispense:  90 tablet    Refill:  1    Disposition:  Follow up in 3 month(s)  Signed, Lesleigh Noe, MD  03/12/2019 9:59 AM    [facility]

## 2019-03-12 ENCOUNTER — Other Ambulatory Visit: Payer: Self-pay

## 2019-03-12 ENCOUNTER — Telehealth (INDEPENDENT_AMBULATORY_CARE_PROVIDER_SITE_OTHER): Payer: Medicare Other | Admitting: Interventional Cardiology

## 2019-03-12 ENCOUNTER — Encounter: Payer: Self-pay | Admitting: Interventional Cardiology

## 2019-03-12 VITALS — BP 130/76 | HR 56 | Ht 70.0 in | Wt 239.0 lb

## 2019-03-12 DIAGNOSIS — E7849 Other hyperlipidemia: Secondary | ICD-10-CM | POA: Diagnosis not present

## 2019-03-12 DIAGNOSIS — I5032 Chronic diastolic (congestive) heart failure: Secondary | ICD-10-CM

## 2019-03-12 DIAGNOSIS — I10 Essential (primary) hypertension: Secondary | ICD-10-CM | POA: Diagnosis not present

## 2019-03-12 DIAGNOSIS — I25810 Atherosclerosis of coronary artery bypass graft(s) without angina pectoris: Secondary | ICD-10-CM

## 2019-03-12 DIAGNOSIS — Z7189 Other specified counseling: Secondary | ICD-10-CM

## 2019-03-12 DIAGNOSIS — R600 Localized edema: Secondary | ICD-10-CM

## 2019-03-12 MED ORDER — FUROSEMIDE 20 MG PO TABS: 20.0000 mg | ORAL_TABLET | Freq: Every day | ORAL | 1 refills | Status: DC

## 2019-03-12 NOTE — Patient Instructions (Signed)
Medication Instructions:   PLEASE CONTINUE TAKING YOUR LASIX 20 MG BY MOUTH DAILY.  THIS WAS REFILLED AND SENT TO YOUR PHARMACY ON FILE.  If you need a refill on your cardiac medications before your next appointment, please call your pharmacy.    Lab work:  YOU WILL NEED TO HAVE A BMET DONE AT OUR OFFICE IN 4 WEEKS-  YOUR APPOINTMENT DATE IS ON Apr 09, 2019    Follow up:  WITH DR. Katrinka Blazing IN 3 MONTHS AS ANOTHER VIRTUAL VIDEO VISIT ON Tuesday June 19, 2019 AT 8:30 AM     Any Other Special Instructions Will Be Listed Below (If Applicable).  *DR Katrinka Blazing WOULD LIKE FOR YOU TO MAINTAIN A LOW SODIUM DIET--PLEASE AVOID SALT IN YOUR DIET*

## 2019-03-12 NOTE — Telephone Encounter (Signed)
error 

## 2019-03-13 ENCOUNTER — Ambulatory Visit: Payer: Self-pay

## 2019-03-13 NOTE — Telephone Encounter (Signed)
Needs telemed

## 2019-03-13 NOTE — Telephone Encounter (Signed)
  Incoming call from Patient reporting that he is constipated.has not had a BM in 4 days.  States he normally has a BM every day.  Denies rectal pain.  He reports that 4 days ago his stools  Were beginning to get hard.  Denies seeing blood in  His stools.  States he was constipated 2 weeks ago. Denies changes in his diet.  No new medication.  States that he eats oatmeal and Swredded Wheat.  Drinks 3 bottle of water/ day.  Related to Patient is on Lasix.  Recommended to try stool softener.  Patient inquired if he could take MOM.  He stated that he used it before. Reviewed Care Advice with Patient Patient voiced understanding.    Reason for Disposition . Treating constipation with Over-The-Counter (OTC) medicines, questions about  Answer Assessment - Initial Assessment Questions 1. STOOL PATTERN OR FREQUENCY: "How often do you pass bowel movements (BMs)?"  (Normal range: tid to q 3 days)  "When was the last BM passed?"       Every day 2. STRAINING: "Do you have to strain to have a BM?"      no 3. RECTAL PAIN: "Does your rectum hurt when the stool comes out?" If so, ask: "Do you have hemorrhoids? How bad is the pain?"  (Scale 1-10; or mild, moderate, severe)     denies 4. STOOL COMPOSITION: "Are the stools hard?"      beginging to be hard 5. BLOOD ON STOOLS: "Has there been any blood on the toilet tissue or on the surface of the BM?" If so, ask: "When was the last time?"      denies 6. CHRONIC CONSTIPATION: "Is this a new problem for you?"  If no, ask: "How long have you had this problem?" (days, weeks, months)       2weeks ago 7. CHANGES IN DIET: "Have there been any recent changes in your diet?"      denies 8. MEDICATIONS: "Have you been taking any new medications?"     *No Answer* 9. LAXATIVES: "Have you been using any laxatives or enemas?"  If yes, ask "What, how often, and when was the last time?"     *No Answer* 10. CAUSE: "What do you think is causing the constipation?"        *No  Answer* 11. OTHER SYMPTOMS: "Do you have any other symptoms?" (e.g., abdominal pain, fever, vomiting)       *No Answer* 12. PREGNANCY: "Is there any chance you are pregnant?" "When was your last menstrual period?"       *No Answer*  Protocols used: CONSTIPATION-A-AH

## 2019-03-14 NOTE — Telephone Encounter (Signed)
If is still having problems patient can have a tele med or webex visit.

## 2019-03-19 ENCOUNTER — Telehealth: Payer: Self-pay | Admitting: Interventional Cardiology

## 2019-03-19 NOTE — Telephone Encounter (Signed)
Spoke with pt and made him aware that Dr. Katrinka Blazing wanted him to have labs in about 4 weeks due to Lasix.  Advised 5/11 was correct to do these labs.  Advised he can come anytime after 745 that morning.  Pt verbalized understanding and was appreciative for call.

## 2019-03-19 NOTE — Telephone Encounter (Signed)
New Message:   Patient calling concering his lab appt patient states that this appt is not right. Please call patient back.

## 2019-03-20 ENCOUNTER — Ambulatory Visit: Payer: Medicare Other | Admitting: Interventional Cardiology

## 2019-03-21 ENCOUNTER — Encounter: Payer: Self-pay | Admitting: Emergency Medicine

## 2019-03-21 ENCOUNTER — Other Ambulatory Visit: Payer: Self-pay

## 2019-03-21 ENCOUNTER — Ambulatory Visit (INDEPENDENT_AMBULATORY_CARE_PROVIDER_SITE_OTHER): Payer: Medicare Other | Admitting: Emergency Medicine

## 2019-03-21 VITALS — BP 153/67 | HR 51 | Temp 98.5°F | Resp 16 | Ht 70.25 in | Wt 228.0 lb

## 2019-03-21 DIAGNOSIS — I110 Hypertensive heart disease with heart failure: Secondary | ICD-10-CM

## 2019-03-21 DIAGNOSIS — R0609 Other forms of dyspnea: Secondary | ICD-10-CM | POA: Diagnosis not present

## 2019-03-21 DIAGNOSIS — I5032 Chronic diastolic (congestive) heart failure: Secondary | ICD-10-CM | POA: Diagnosis not present

## 2019-03-21 DIAGNOSIS — Z8679 Personal history of other diseases of the circulatory system: Secondary | ICD-10-CM | POA: Diagnosis not present

## 2019-03-21 DIAGNOSIS — I10 Essential (primary) hypertension: Secondary | ICD-10-CM | POA: Diagnosis not present

## 2019-03-21 DIAGNOSIS — I25810 Atherosclerosis of coronary artery bypass graft(s) without angina pectoris: Secondary | ICD-10-CM | POA: Diagnosis not present

## 2019-03-21 NOTE — Patient Instructions (Addendum)
   If you have lab work done today you will be contacted with your lab results within the next 2 weeks.  If you have not heard from us then please contact us. The fastest way to get your results is to register for My Chart.   IF you received an x-ray today, you will receive an invoice from [facility]. Please contact [facility] at [phone] with questions or concerns regarding your invoice.   IF you received labwork today, you will receive an invoice from [facility]. Please contact [facility] at [phone] with questions or concerns regarding your invoice.   Our billing staff will not be able to assist you with questions regarding bills from these companies.  You will be contacted with the lab results as soon as they are available. The fastest way to get your results is to activate your My Chart account. Instructions are located on the last page of this paperwork. If you have not heard from us regarding the results in 2 weeks, please contact this office.       Hypertension Hypertension, commonly called high blood pressure, is when the force of blood pumping through the arteries is too strong. The arteries are the blood vessels that carry blood from the heart throughout the body. Hypertension forces the heart to work harder to pump blood and may cause arteries to become narrow or stiff. Having untreated or uncontrolled hypertension can cause heart attacks, strokes, kidney disease, and other problems. A blood pressure reading consists of a higher number over a lower number. Ideally, your blood pressure should be below 120/80. The first ("top") number is called the systolic pressure. It is a measure of the pressure in your arteries as your heart beats. The second ("bottom") number is called the diastolic pressure. It is a measure of the pressure in your arteries as the heart relaxes. What are the causes? The cause of this condition is not known. What increases the  risk? Some risk factors for high blood pressure are under your control. Others are not. Factors you can change  Smoking.  Having type 2 diabetes mellitus, high cholesterol, or both.  Not getting enough exercise or physical activity.  Being overweight.  Having too much fat, sugar, calories, or salt (sodium) in your diet.  Drinking too much alcohol. Factors that are difficult or impossible to change  Having chronic kidney disease.  Having a family history of high blood pressure.  Age. Risk increases with age.  Race. You may be at higher risk if you are African-American.  Gender. Men are at higher risk than women before age 45. After age 65, women are at higher risk than men.  Having obstructive sleep apnea.  Stress. What are the signs or symptoms? Extremely high blood pressure (hypertensive crisis) may cause:  Headache.  Anxiety.  Shortness of breath.  Nosebleed.  Nausea and vomiting.  Severe chest pain.  Jerky movements you cannot control (seizures). How is this diagnosed? This condition is diagnosed by measuring your blood pressure while you are seated, with your arm resting on a surface. The cuff of the blood pressure monitor will be placed directly against the skin of your upper arm at the level of your heart. It should be measured at least twice using the same arm. Certain conditions can cause a difference in blood pressure between your right and left arms. Certain factors can cause blood pressure readings to be lower or higher than normal (elevated) for a short period of time:    When your blood pressure is higher when you are in a health care provider's office than when you are at home, this is called white coat hypertension. Most people with this condition do not need medicines.  When your blood pressure is higher at home than when you are in a health care provider's office, this is called masked hypertension. Most people with this condition may need medicines  to control blood pressure. If you have a high blood pressure reading during one visit or you have normal blood pressure with other risk factors:  You may be asked to return on a different day to have your blood pressure checked again.  You may be asked to monitor your blood pressure at home for 1 week or longer. If you are diagnosed with hypertension, you may have other blood or imaging tests to help your health care provider understand your overall risk for other conditions. How is this treated? This condition is treated by making healthy lifestyle changes, such as eating healthy foods, exercising more, and reducing your alcohol intake. Your health care provider may prescribe medicine if lifestyle changes are not enough to get your blood pressure under control, and if:  Your systolic blood pressure is above 130.  Your diastolic blood pressure is above 80. Your personal target blood pressure may vary depending on your medical conditions, your age, and other factors. Follow these instructions at home: Eating and drinking   Eat a diet that is high in fiber and potassium, and low in sodium, added sugar, and fat. An example eating plan is called the DASH (Dietary Approaches to Stop Hypertension) diet. To eat this way: ? Eat plenty of fresh fruits and vegetables. Try to fill half of your plate at each meal with fruits and vegetables. ? Eat whole grains, such as whole wheat pasta, brown rice, or whole grain bread. Fill about one quarter of your plate with whole grains. ? Eat or drink low-fat dairy products, such as skim milk or low-fat yogurt. ? Avoid fatty cuts of meat, processed or cured meats, and poultry with skin. Fill about one quarter of your plate with lean proteins, such as fish, chicken without skin, beans, eggs, and tofu. ? Avoid premade and processed foods. These tend to be higher in sodium, added sugar, and fat.  Reduce your daily sodium intake. Most people with hypertension should  eat less than 1,500 mg of sodium a day.  Limit alcohol intake to no more than 1 drink a day for nonpregnant women and 2 drinks a day for men. One drink equals 12 oz of beer, 5 oz of wine, or 1 oz of hard liquor. Lifestyle   Work with your health care provider to maintain a healthy body weight or to lose weight. Ask what an ideal weight is for you.  Get at least 30 minutes of exercise that causes your heart to beat faster (aerobic exercise) most days of the week. Activities may include walking, swimming, or biking.  Include exercise to strengthen your muscles (resistance exercise), such as pilates or lifting weights, as part of your weekly exercise routine. Try to do these types of exercises for 30 minutes at least 3 days a week.  Do not use any products that contain nicotine or tobacco, such as cigarettes and e-cigarettes. If you need help quitting, ask your health care provider.  Monitor your blood pressure at home as told by your health care provider.  Keep all follow-up visits as told by your health care provider.   This is important. Medicines  Take over-the-counter and prescription medicines only as told by your health care provider. Follow directions carefully. Blood pressure medicines must be taken as prescribed.  Do not skip doses of blood pressure medicine. Doing this puts you at risk for problems and can make the medicine less effective.  Ask your health care provider about side effects or reactions to medicines that you should watch for. Contact a health care provider if:  You think you are having a reaction to a medicine you are taking.  You have headaches that keep coming back (recurring).  You feel dizzy.  You have swelling in your ankles.  You have trouble with your vision. Get help right away if:  You develop a severe headache or confusion.  You have unusual weakness or numbness.  You feel faint.  You have severe pain in your chest or abdomen.  You vomit  repeatedly.  You have trouble breathing. Summary  Hypertension is when the force of blood pumping through your arteries is too strong. If this condition is not controlled, it may put you at risk for serious complications.  Your personal target blood pressure may vary depending on your medical conditions, your age, and other factors. For most people, a normal blood pressure is less than 120/80.  Hypertension is treated with lifestyle changes, medicines, or a combination of both. Lifestyle changes include weight loss, eating a healthy, low-sodium diet, exercising more, and limiting alcohol. This information is not intended to replace advice given to you by your health care provider. Make sure you discuss any questions you have with your health care provider. Document Released: 11/15/2005 Document Revised: 10/13/2016 Document Reviewed: 10/13/2016 Elsevier Interactive Patient Education  2019 [employer]  

## 2019-03-21 NOTE — Progress Notes (Signed)
[name] 73 y.o.   Chief Complaint  Patient presents with  . Shortness of Breath    with exertion follow up    HISTORY OF PRESENT ILLNESS: This is a 73 y.o. male here for follow-up of dyspnea on exertion.  Feels a lot better since starting the Lasix.  Presently taking ARB and beta-blocker.  Nitroglycerin as needed.    BP Readings from Last 3 Encounters:  03/21/19 (!) 153/67  03/12/19 130/76  03/08/19 (!) 149/75   Wt Readings from Last 3 Encounters:  03/21/19 228 lb (103.4 kg)  03/12/19 239 lb (108.4 kg)  03/08/19 249 lb (112.9 kg)   Lab Results  Component Value Date   CREATININE 1.13 03/08/2019   BUN 12 03/08/2019   NA 136 03/08/2019   K 3.6 03/08/2019   CL 100 03/08/2019   CO2 26 03/08/2019     Recent cardiology telemedicine visit showed the following: 1. Coronary artery disease involving coronary bypass graft of native heart without angina pectoris   2. Essential hypertension   3. Other hyperlipidemia   4. Pedal edema   5. Chronic diastolic heart failure (HCC)   6. Educated About Covid-19 Virus Infection    Plan in order of problem: 1. Stable without obvious angina.  We rediscussed secondary prevention.  He needs more physical activity. 2. Blood pressure is at target less than 130/80 mmHg. 3. LDL target is 70.  He is not currently taking statins because of intolerance. 4. Edema has now resolved and is due to problem #5 5. Plan to continue furosemide 20 mg/day.  He needs to have a basic metabolic panel performed in 4 to 12 weeks to ensure volume status is stable.  Shortness of Breath  Pertinent negatives include no abdominal pain, chest pain, fever, headaches, rash, sore throat or vomiting.     Prior to Admission medications   Medication Sig Start Date End Date Taking? Authorizing Provider  FLUoxetine (PROZAC) 20 MG capsule Take 20 mg by mouth daily.   Yes [provider]  folic acid (FOLVITE) 1 MG tablet Take 1 tablet (1 mg total) by mouth  daily. 01/24/19  Yes Safi Culotta, Eilleen Kempf, MD  furosemide (LASIX) 20 MG tablet Take 1 tablet (20 mg total) by mouth daily. 03/12/19  Yes Lyn Records, MD  losartan (COZAAR) 25 MG tablet Take 1 tablet (25 mg total) by mouth daily. 01/24/19 04/24/19 Yes Abella Shugart, Eilleen Kempf, MD  metoprolol succinate (TOPROL-XL) 25 MG 24 hr tablet Take 1 tablet (25 mg total) by mouth daily. 04/25/18  Yes Georgie Chard D, NP  pantoprazole (PROTONIX) 40 MG tablet Take 1 tablet (40 mg total) by mouth 2 (two) times daily. 02/26/19  Yes Lanelle Bal, MD  albuterol (PROVENTIL HFA;VENTOLIN HFA) 108 (90 Base) MCG/ACT inhaler Inhale 2 puffs into the lungs every 4 (four) hours as needed for up to 30 days for wheezing or shortness of breath. Patient not taking: Reported on 03/21/2019 03/08/19 04/07/19  Arlyn Dunning, PA-C  nitroGLYCERIN (NITROSTAT) 0.4 MG SL tablet Place 1 tablet (0.4 mg total) under the tongue every 5 (five) minutes as needed for chest pain. Patient not taking: Reported on 03/21/2019 01/24/19   Georgina Quint, MD    Allergies  Allergen Reactions  . Codeine Palpitations  . Vicodin [Hydrocodone-Acetaminophen] Nausea And Vomiting, Swelling and Palpitations    Patient Active Problem List   Diagnosis Date Noted  . Educated About Covid-19 Virus Infection 03/12/2019  . DOE (dyspnea on exertion) 03/05/2019  .  Cellulitis of left knee 03/05/2019  . Thrombocytopenia (HCC) 02/24/2019  . Abnormal liver function tests 02/24/2019  . Hypertensive urgency 12/11/2018  . Chronic alcohol abuse 12/09/2015  . Left knee pain 12/02/2015  . Coronary artery disease involving coronary bypass graft of native heart without angina pectoris 08/07/2014  . Essential hypertension 08/07/2014  . Hyperlipidemia 08/07/2014    Past Medical History:  Diagnosis Date  . Anginal pain (HCC) 01/2005  . Anxiety   . Arthritis    "knees"  . Chronic lower back pain   . Coronary artery disease   . Depression   . Hepatitis     "don't know what kind; they treated me for it; was a long time ago" (12/11/2018)  . High cholesterol   . History of bronchitis    "used to get it alot" (08/25/2012)  . Hypertension   . Panic attacks   . PTSD (post-traumatic stress disorder)    "have been treated in the past" (08/25/2012)    Past Surgical History:  Procedure Laterality Date  . CARDIAC CATHETERIZATION  01/2005  . CORONARY ARTERY BYPASS GRAFT  01/2005   CABG X3  . Shrapnel Left 1969   LLE; left lateral thumb (required grafting)  . VARICOSE VEIN SURGERY  1980's   LLE    Social History   Socioeconomic History  . Marital status: Divorced    Spouse name: Not on file  . Number of children: Not on file  . Years of education: Not on file  . Highest education level: Not on file  Occupational History  . Not on file  Social Needs  . Financial resource strain: Not on file  . Food insecurity:    Worry: Not on file    Inability: Not on file  . Transportation needs:    Medical: Not on file    Non-medical: Not on file  Tobacco Use  . Smoking status: Former Smoker    Packs/day: 2.00    Years: 5.00    Pack years: 10.00  . Smokeless tobacco: Never Used  . Tobacco comment: 08/25/2012 "quit smoking cigarettes 40 yr ago"  Substance and Sexual Activity  . Alcohol use: Yes    Alcohol/week: 60.0 standard drinks    Types: 60 Standard drinks or equivalent per week    Comment: 12/11/2018 "~ 1 pint of vodka/day"  . Drug use: Yes    Types: Marijuana    Comment: 08/25/2012 "last marijuana 10 years ago"  . Sexual activity: Yes  Lifestyle  . Physical activity:    Days per week: Not on file    Minutes per session: Not on file  . Stress: Not on file  Relationships  . Social connections:    Talks on phone: Not on file    Gets together: Not on file    Attends religious service: Not on file    Active member of club or organization: Not on file    Attends meetings of clubs or organizations: Not on file    Relationship status: Not  on file  . Intimate partner violence:    Fear of current or ex partner: Not on file    Emotionally abused: Not on file    Physically abused: Not on file    Forced sexual activity: Not on file  Other Topics Concern  . Not on file  Social History Narrative  . Not on file    Family History  Problem Relation Age of Onset  . Diabetes Mother   . Heart disease  Father   . Cancer Brother      Review of Systems  Constitutional: Negative.  Negative for chills and fever.  HENT: Negative for congestion and sore throat.   Respiratory: Positive for shortness of breath (On exertion, much improved).   Cardiovascular: Negative.  Negative for chest pain and palpitations.  Gastrointestinal: Negative for abdominal pain, diarrhea, nausea and vomiting.  Genitourinary: Negative for dysuria and hematuria.  Musculoskeletal: Negative for myalgias.  Skin: Negative.  Negative for rash.  Neurological: Negative for dizziness and headaches.  All other systems reviewed and are negative.     Vitals:   03/21/19 1631  BP: (!) 153/67  Pulse: (!) 51  Resp: 16  Temp: 98.5 F (36.9 C)  SpO2: 96%    Physical Exam Vitals signs reviewed.  Constitutional:      Appearance: He is well-developed.  HENT:     Head: Normocephalic and atraumatic.     Nose: Nose normal.     Mouth/Throat:     Mouth: Mucous membranes are moist.     Pharynx: Oropharynx is clear.  Eyes:     Extraocular Movements: Extraocular movements intact.     Conjunctiva/sclera: Conjunctivae normal.     Pupils: Pupils are equal, round, and reactive to light.  Neck:     Musculoskeletal: Normal range of motion and neck supple.  Cardiovascular:     Rate and Rhythm: Normal rate and regular rhythm.     Pulses: Normal pulses.     Heart sounds: Normal heart sounds.  Pulmonary:     Effort: Pulmonary effort is normal.     Breath sounds: Normal breath sounds.  Abdominal:     Palpations: Abdomen is soft.  Musculoskeletal: Normal range of  motion.     Right lower leg: No edema.     Left lower leg: No edema.  Skin:    General: Skin is warm and dry.     Capillary Refill: Capillary refill takes less than 2 seconds.  Neurological:     General: No focal deficit present.     Mental Status: He is alert and oriented to person, place, and time.     Sensory: No sensory deficit.     Motor: No weakness.  Psychiatric:        Mood and Affect: Mood normal.        Behavior: Behavior normal.    A total of 25 minutes was spent in the room with the patient, greater than 50% of which was in counseling/coordination of care regarding diagnosis, medications, treatment, prognosis, and need for blood work and follow-up.   ASSESSMENT & PLAN: Lennyn was seen today for shortness of breath.  Diagnoses and all orders for this visit:  Hypertensive heart disease with chronic diastolic congestive heart failure (HCC)  Essential hypertension -     CBC with Differential/Platelet -     Comprehensive metabolic panel  DOE (dyspnea on exertion)  History of coronary artery disease    Patient Instructions       If you have lab work done today you will be contacted with your lab results within the next 2 weeks.  If you have not heard from [place] then please contact us. The fastest way to get your results is to register for My Chart.   IF you received an x-ray today, you will receive an invoice from [facility]. Please contact [facility] at [phone] with questions or concerns regarding your invoice.   IF you received labwork today, you will receive  an Economist from [employer]. Please contact [facility] at [phone] with questions or concerns regarding your invoice.   Our billing staff will not be able to assist you with questions regarding bills from these companies.  You will be contacted with the lab results as soon as they are available. The fastest way to get your results is to activate your My Chart account. Instructions  are located on the last page of this paperwork. If you have not heard from [place] regarding the results in 2 weeks, please contact this office.     Hypertension Hypertension, commonly called high blood pressure, is when the force of blood pumping through the arteries is too strong. The arteries are the blood vessels that carry blood from the heart throughout the body. Hypertension forces the heart to work harder to pump blood and may cause arteries to become narrow or stiff. Having untreated or uncontrolled hypertension can cause heart attacks, strokes, kidney disease, and other problems. A blood pressure reading consists of a higher number over a lower number. Ideally, your blood pressure should be below 120/80. The first ("top") number is called the systolic pressure. It is a measure of the pressure in your arteries as your heart beats. The second ("bottom") number is called the diastolic pressure. It is a measure of the pressure in your arteries as the heart relaxes. What are the causes? The cause of this condition is not known. What increases the risk? Some risk factors for high blood pressure are under your control. Others are not. Factors you can change  Smoking.  Having type 2 diabetes mellitus, high cholesterol, or both.  Not getting enough exercise or physical activity.  Being overweight.  Having too much fat, sugar, calories, or salt (sodium) in your diet.  Drinking too much alcohol. Factors that are difficult or impossible to change  Having chronic kidney disease.  Having a family history of high blood pressure.  Age. Risk increases with age.  Race. You may be at higher risk if you are African-American.  Gender. Men are at higher risk than women before age 48. After age 55, women are at higher risk than men.  Having obstructive sleep apnea.  Stress. What are the signs or symptoms? Extremely high blood pressure (hypertensive crisis) may cause:  Headache.  Anxiety.   Shortness of breath.  Nosebleed.  Nausea and vomiting.  Severe chest pain.  Jerky movements you cannot control (seizures). How is this diagnosed? This condition is diagnosed by measuring your blood pressure while you are seated, with your arm resting on a surface. The cuff of the blood pressure monitor will be placed directly against the skin of your upper arm at the level of your heart. It should be measured at least twice using the same arm. Certain conditions can cause a difference in blood pressure between your right and left arms. Certain factors can cause blood pressure readings to be lower or higher than normal (elevated) for a short period of time:  When your blood pressure is higher when you are in a health care provider's office than when you are at home, this is called white coat hypertension. Most people with this condition do not need medicines.  When your blood pressure is higher at home than when you are in a health care provider's office, this is called masked hypertension. Most people with this condition may need medicines to control blood pressure. If you have a high blood pressure reading during one visit or you have normal blood  pressure with other risk factors:  You may be asked to return on a different day to have your blood pressure checked again.  You may be asked to monitor your blood pressure at home for 1 week or longer. If you are diagnosed with hypertension, you may have other blood or imaging tests to help your health care provider understand your overall risk for other conditions. How is this treated? This condition is treated by making healthy lifestyle changes, such as eating healthy foods, exercising more, and reducing your alcohol intake. Your health care provider may prescribe medicine if lifestyle changes are not enough to get your blood pressure under control, and if:  Your systolic blood pressure is above 130.  Your diastolic blood pressure is above  80. Your personal target blood pressure may vary depending on your medical conditions, your age, and other factors. Follow these instructions at home: Eating and drinking   Eat a diet that is high in fiber and potassium, and low in sodium, added sugar, and fat. An example eating plan is called the DASH (Dietary Approaches to Stop Hypertension) diet. To eat this way: ? Eat plenty of fresh fruits and vegetables. Try to fill half of your plate at each meal with fruits and vegetables. ? Eat whole grains, such as whole wheat pasta, brown rice, or whole grain bread. Fill about one quarter of your plate with whole grains. ? Eat or drink low-fat dairy products, such as skim milk or low-fat yogurt. ? Avoid fatty cuts of meat, processed or cured meats, and poultry with skin. Fill about one quarter of your plate with lean proteins, such as fish, chicken without skin, beans, eggs, and tofu. ? Avoid premade and processed foods. These tend to be higher in sodium, added sugar, and fat.  Reduce your daily sodium intake. Most people with hypertension should eat less than 1,500 mg of sodium a day.  Limit alcohol intake to no more than 1 drink a day for nonpregnant women and 2 drinks a day for men. One drink equals 12 oz of beer, 5 oz of wine, or 1 oz of hard liquor. Lifestyle   Work with your health care provider to maintain a healthy body weight or to lose weight. Ask what an ideal weight is for you.  Get at least 30 minutes of exercise that causes your heart to beat faster (aerobic exercise) most days of the week. Activities may include walking, swimming, or biking.  Include exercise to strengthen your muscles (resistance exercise), such as pilates or lifting weights, as part of your weekly exercise routine. Try to do these types of exercises for 30 minutes at least 3 days a week.  Do not use any products that contain nicotine or tobacco, such as cigarettes and e-cigarettes. If you need help quitting, ask  your health care provider.  Monitor your blood pressure at home as told by your health care provider.  Keep all follow-up visits as told by your health care provider. This is important. Medicines  Take over-the-counter and prescription medicines only as told by your health care provider. Follow directions carefully. Blood pressure medicines must be taken as prescribed.  Do not skip doses of blood pressure medicine. Doing this puts you at risk for problems and can make the medicine less effective.  Ask your health care provider about side effects or reactions to medicines that you should watch for. Contact a health care provider if:  You think you are having a reaction to a medicine  you are taking.  You have headaches that keep coming back (recurring).  You feel dizzy.  You have swelling in your ankles.  You have trouble with your vision. Get help right away if:  You develop a severe headache or confusion.  You have unusual weakness or numbness.  You feel faint.  You have severe pain in your chest or abdomen.  You vomit repeatedly.  You have trouble breathing. Summary  Hypertension is when the force of blood pumping through your arteries is too strong. If this condition is not controlled, it may put you at risk for serious complications.  Your personal target blood pressure may vary depending on your medical conditions, your age, and other factors. For most people, a normal blood pressure is less than 120/80.  Hypertension is treated with lifestyle changes, medicines, or a combination of both. Lifestyle changes include weight loss, eating a healthy, low-sodium diet, exercising more, and limiting alcohol. This information is not intended to replace advice given to you by your health care provider. Make sure you discuss any questions you have with your health care provider. Document Released: 11/15/2005 Document Revised: 10/13/2016 Document Reviewed: 10/13/2016 Elsevier  Interactive Patient Education  2019 Elsevier Inc.      Edwina Barth, MD [facility]

## 2019-03-22 LAB — COMPREHENSIVE METABOLIC PANEL
ALT: 56 IU/L — ABNORMAL HIGH (ref 0–44)
AST: 40 IU/L (ref 0–40)
Albumin/Globulin Ratio: 1.1 — ABNORMAL LOW (ref 1.2–2.2)
Albumin: 4.6 g/dL (ref 3.7–4.7)
Alkaline Phosphatase: 56 IU/L (ref 39–117)
BUN/Creatinine Ratio: 19 (ref 10–24)
BUN: 23 mg/dL (ref 8–27)
Bilirubin Total: 0.7 mg/dL (ref 0.0–1.2)
CO2: 21 mmol/L (ref 20–29)
Calcium: 9.5 mg/dL (ref 8.6–10.2)
Chloride: 101 mmol/L (ref 96–106)
Creatinine, Ser: 1.22 mg/dL (ref 0.76–1.27)
GFR calc Af Amer: 69 mL/min/{1.73_m2} (ref >59)
GFR calc non Af Amer: 59 mL/min/{1.73_m2} — ABNORMAL LOW (ref >59)
Globulin, Total: 4.2 g/dL (ref 1.5–4.5)
Glucose: 104 mg/dL — ABNORMAL HIGH (ref 65–99)
Potassium: 4.5 mmol/L (ref 3.5–5.2)
Sodium: 136 mmol/L (ref 134–144)
Total Protein: 8.8 g/dL — ABNORMAL HIGH (ref 6.0–8.5)

## 2019-03-22 LAB — CBC WITH DIFFERENTIAL/PLATELET

## 2019-03-22 NOTE — Telephone Encounter (Signed)
I did tell him this at triage. Thanks

## 2019-03-22 NOTE — Progress Notes (Signed)
Letter sent.

## 2019-03-26 ENCOUNTER — Telehealth: Payer: Self-pay | Admitting: Interventional Cardiology

## 2019-03-26 NOTE — Telephone Encounter (Signed)
New message   Patient states that he had blood work done by Dr. Alvy Bimler at [facility]. He wants to know if he still would need some lab work done on 04/09/2019. Please call to advise.

## 2019-03-26 NOTE — Telephone Encounter (Signed)
I spoke to the patient who is inquiring whether or not he has to come in for 5/11 schedule BMP.  I mentioned that due to the fact that he is on Lasix, it is encouraged to follow his potassium.  He recently had labs drawn 4/22 by his PCP and was asking.  I told him for now to keep that appt, but that I would forward for further advisement.

## 2019-03-27 ENCOUNTER — Telehealth: Payer: Self-pay | Admitting: Interventional Cardiology

## 2019-03-27 ENCOUNTER — Telehealth: Payer: Self-pay | Admitting: Emergency Medicine

## 2019-03-27 NOTE — Telephone Encounter (Signed)
Patient returned Michael's call, please call him after 1pm.

## 2019-03-27 NOTE — Telephone Encounter (Signed)
New Message    Pt is calling and would like to speak to [facility]   Please call back

## 2019-03-27 NOTE — Telephone Encounter (Signed)
Copied from CRM [id number]. Topic: General - Other >> Mar 27, 2019 10:06 AM [name] L wrote: Reason for CRM: Patient would like a call back and was adamant that he does not provide any details as to why. All he mentioned was that his thermometer broke and he can't check his temperature and would like a call back from Dr. Alvy Bimler or his nurse.

## 2019-03-27 NOTE — Telephone Encounter (Signed)
I spoke to the patient and advised him against going into our office for a temperature check, risking exposure. He will reach out to [facility] for a battery for his thermometer.

## 2019-03-27 NOTE — Telephone Encounter (Signed)
I spoke to the patient who wanted to come by the office and have his temperature checked in the lobby to see if he has a fever.  I recommended against that to limit exposure.  I instructed him to go to [facility] and get a replacement battery for his thermometer.

## 2019-03-27 NOTE — Telephone Encounter (Signed)
New Message   Patient states he was speaking to you about getting his temperature checked.  Also states he was taking a quick shower could you please call him back after 1:05pm today.

## 2019-03-27 NOTE — Telephone Encounter (Signed)
Please find out what he wants.  Thanks.

## 2019-03-29 ENCOUNTER — Telehealth: Payer: Self-pay | Admitting: Interventional Cardiology

## 2019-03-29 ENCOUNTER — Encounter: Payer: Self-pay | Admitting: Emergency Medicine

## 2019-03-29 ENCOUNTER — Telehealth (INDEPENDENT_AMBULATORY_CARE_PROVIDER_SITE_OTHER): Payer: Medicare Other | Admitting: Emergency Medicine

## 2019-03-29 ENCOUNTER — Other Ambulatory Visit: Payer: Self-pay

## 2019-03-29 ENCOUNTER — Ambulatory Visit: Payer: Self-pay | Admitting: Emergency Medicine

## 2019-03-29 DIAGNOSIS — I110 Hypertensive heart disease with heart failure: Secondary | ICD-10-CM

## 2019-03-29 DIAGNOSIS — I5032 Chronic diastolic (congestive) heart failure: Secondary | ICD-10-CM | POA: Diagnosis not present

## 2019-03-29 DIAGNOSIS — Z8679 Personal history of other diseases of the circulatory system: Secondary | ICD-10-CM | POA: Diagnosis not present

## 2019-03-29 DIAGNOSIS — F1010 Alcohol abuse, uncomplicated: Secondary | ICD-10-CM | POA: Diagnosis not present

## 2019-03-29 NOTE — Telephone Encounter (Signed)
New Message   Patient is calling because he had to buy a new thermometer. He said when he checks her temperature it shows 95.3, 95.6 and then 95.7. He said this is alarming to him and he feel that his kidney's are shutting down. He said he tried to call his PCP but he can not get through. Please call.

## 2019-03-29 NOTE — Telephone Encounter (Signed)
Pt has tele med apt today.

## 2019-03-29 NOTE — Telephone Encounter (Signed)
Needs tele med apt.

## 2019-03-29 NOTE — Telephone Encounter (Signed)
Spoke with pt and advised that reaching out to the PCP was the right thing to do.  He mentioned that he has a virtual visit with PCP later today.  Advised to keep that appt and discuss concerns with him.  Pt appreciative for call.

## 2019-03-29 NOTE — Progress Notes (Signed)
Telemedicine Encounter- SOAP NOTE Established Patient  Attempted video communication without success.  This telephone encounter was conducted with the patient's (or proxy's) verbal consent via audio telecommunications: yes/no: Yes Patient was instructed to have this encounter in a suitably private space; and to only have persons present to whom they give permission to participate. In addition, patient identity was confirmed by use of name plus two identifiers (DOB and address).  I discussed the limitations, risks, security and privacy concerns of performing an evaluation and management service by telephone and the availability of in person appointments. I also discussed with the patient that there may be a patient responsible charge related to this service. The patient expressed understanding and agreed to proceed.  I spent a total of TIME; 0 MIN TO 60 MIN: 15 minutes talking with the patient or their proxy.  No chief complaint on file. Urinary question and temperature concern  Subjective   [name] is a 73 y.o. male established patient.  Seen by me 1 week ago, stable, unremarkable blood work with acceptable values.  Has a history of diastolic heart failure, on beta-blocker, ACE inhibitor and loop diuretic.  States this morning he got up and was not able to urinate.  Had breakfast, took Lasix, and then was able to urinate normal.  Also concerned about low temperature readings 95.7, 95.9, 96.1 in the past several days but denies flulike symptoms or any infection symptoms.  Otherwise doing well with no other complaints or medical concerns.  HPI   Patient Active Problem List   Diagnosis Date Noted  . Educated About Covid-19 Virus Infection 03/12/2019  . DOE (dyspnea on exertion) 03/05/2019  . Thrombocytopenia (HCC) 02/24/2019  . Abnormal liver function tests 02/24/2019  . Hypertensive urgency 12/11/2018  . Chronic alcohol abuse 12/09/2015  . Coronary artery disease involving  coronary bypass graft of native heart without angina pectoris 08/07/2014  . Essential hypertension 08/07/2014  . Hyperlipidemia 08/07/2014    Past Medical History:  Diagnosis Date  . Anginal pain (HCC) 01/2005  . Anxiety   . Arthritis    "knees"  . Chronic lower back pain   . Coronary artery disease   . Depression   . Hepatitis    "don't know what kind; they treated me for it; was a long time ago" (12/11/2018)  . High cholesterol   . History of bronchitis    "used to get it alot" (08/25/2012)  . Hypertension   . Panic attacks   . PTSD (post-traumatic stress disorder)    "have been treated in the past" (08/25/2012)    Current Outpatient Medications  Medication Sig Dispense Refill  . FLUoxetine (PROZAC) 20 MG capsule Take 20 mg by mouth daily.    . folic acid (FOLVITE) 1 MG tablet Take 1 tablet (1 mg total) by mouth daily. 30 tablet 3  . furosemide (LASIX) 20 MG tablet Take 1 tablet (20 mg total) by mouth daily. 90 tablet 1  . losartan (COZAAR) 25 MG tablet Take 1 tablet (25 mg total) by mouth daily. 90 tablet 1  . metoprolol succinate (TOPROL-XL) 25 MG 24 hr tablet Take 1 tablet (25 mg total) by mouth daily. 90 tablet 4  . nitroGLYCERIN (NITROSTAT) 0.4 MG SL tablet Place 1 tablet (0.4 mg total) under the tongue every 5 (five) minutes as needed for chest pain. 25 tablet 3  . pantoprazole (PROTONIX) 40 MG tablet Take 1 tablet (40 mg total) by mouth 2 (two) times daily. 60 tablet  1  . albuterol (PROVENTIL HFA;VENTOLIN HFA) 108 (90 Base) MCG/ACT inhaler Inhale 2 puffs into the lungs every 4 (four) hours as needed for up to 30 days for wheezing or shortness of breath. (Patient not taking: Reported on 03/29/2019) 1 Inhaler 0   No current facility-administered medications for this visit.     Allergies  Allergen Reactions  . Codeine Palpitations  . Vicodin [Hydrocodone-Acetaminophen] Nausea And Vomiting, Swelling and Palpitations    Social History   Socioeconomic History  . Marital  status: Divorced    Spouse name: Not on file  . Number of children: Not on file  . Years of education: Not on file  . Highest education level: Not on file  Occupational History  . Not on file  Social Needs  . Financial resource strain: Not on file  . Food insecurity:    Worry: Not on file    Inability: Not on file  . Transportation needs:    Medical: Not on file    Non-medical: Not on file  Tobacco Use  . Smoking status: Former Smoker    Packs/day: 2.00    Years: 5.00    Pack years: 10.00  . Smokeless tobacco: Never Used  . Tobacco comment: 08/25/2012 "quit smoking cigarettes 40 yr ago"  Substance and Sexual Activity  . Alcohol use: Yes    Alcohol/week: 60.0 standard drinks    Types: 60 Standard drinks or equivalent per week    Comment: 12/11/2018 "~ 1 pint of vodka/day"  . Drug use: Yes    Types: Marijuana    Comment: 08/25/2012 "last marijuana 10 years ago"  . Sexual activity: Yes  Lifestyle  . Physical activity:    Days per week: Not on file    Minutes per session: Not on file  . Stress: Not on file  Relationships  . Social connections:    Talks on phone: Not on file    Gets together: Not on file    Attends religious service: Not on file    Active member of club or organization: Not on file    Attends meetings of clubs or organizations: Not on file    Relationship status: Not on file  . Intimate partner violence:    Fear of current or ex partner: Not on file    Emotionally abused: Not on file    Physically abused: Not on file    Forced sexual activity: Not on file  Other Topics Concern  . Not on file  Social History Narrative  . Not on file    Review of Systems  Constitutional: Negative.  Negative for chills and fever.  HENT: Negative for congestion, nosebleeds and sore throat.   Eyes: Negative for discharge and redness.  Respiratory: Negative for cough, shortness of breath and wheezing.   Cardiovascular: Negative for chest pain, palpitations and leg  swelling.  Gastrointestinal: Negative for abdominal pain, diarrhea, nausea and vomiting.  Musculoskeletal: Negative for myalgias.  Skin: Negative.  Negative for rash.  Neurological: Negative for dizziness and headaches.  All other systems reviewed and are negative.   Objective   Vitals as reported by the patient: None available There were no vitals filed for this visit. Awake and oriented x3 in no apparent respiratory distress There are no diagnoses linked to this encounter. Diagnoses and all orders for this visit:  Hypertensive heart disease with chronic diastolic congestive heart failure (HCC)  Chronic alcohol abuse  History of coronary artery disease   Clinically stable.  No medical  concerns identified during this visit. Continue present medications.  No changes. Advised to monitor for flulike symptoms. Blood work results reviewed with patient.  Unremarkable results. Follow-up in the office as scheduled last week.  I discussed the assessment and treatment plan with the patient. The patient was provided an opportunity to ask questions and all were answered. The patient agreed with the plan and demonstrated an understanding of the instructions.   The patient was advised to call back or seek an in-person evaluation if the symptoms worsen or if the condition fails to improve as anticipated.  I provided 15 minutes of non-face-to-face time during this encounter.  Georgina QuintMiguel Jose Ashlie Mcmenamy, MD  [facility] at [facility]

## 2019-03-29 NOTE — Telephone Encounter (Signed)
Pt. Reports he has checked his temp. With new thermometer and it is running low - 95-96. States he is having some difficulty passing urine this morning and "felt a little disoriented. I had to try and remember my birthday." Denies any other symptoms.Warm transfer to [facility] in the practice for a virtual visit.  Answer Assessment - Initial Assessment Questions 1. SYMPTOM: "What's the main symptom you're concerned about?" (e.g., frequency, incontinence)     Difficulty passing urine 2. ONSET: "When did the  difficulty  start?"     This morning 3. PAIN: "Is there any pain?" If so, ask: "How bad is it?" (Scale: 1-10; mild, moderate, severe)     No 4. CAUSE: "What do you think is causing the symptoms?"     Unsure 5. OTHER SYMPTOMS: "Do you have any other symptoms?" (e.g., fever, flank pain, blood in urine, pain with urination)     No other urinary symptoms 6. PREGNANCY: "Is there any chance you are pregnant?" "When was your last menstrual period?"     n/a  Protocols used: URINARY [facility]

## 2019-03-29 NOTE — Progress Notes (Signed)
Contacted patient to triage for appointment. Patient states he was having problems urinating. Patient states he took his medication Lasix and he urinated some. Patient drinks a lot of water and he does not have burning when he urinates. Also, patient states he bought two thermometers to take his temperature both readings were 95.7 and 96.1. patient called Dr Katrinka Blazing his cardiologist and Victorino Dike the nurse told him that was dangerous. Patient took them back to [facility] to return them, because they were a defect.

## 2019-04-09 ENCOUNTER — Other Ambulatory Visit: Payer: Medicare Other | Admitting: *Deleted

## 2019-04-09 ENCOUNTER — Other Ambulatory Visit: Payer: Self-pay

## 2019-04-09 DIAGNOSIS — I10 Essential (primary) hypertension: Secondary | ICD-10-CM | POA: Diagnosis not present

## 2019-04-09 DIAGNOSIS — R600 Localized edema: Secondary | ICD-10-CM | POA: Diagnosis not present

## 2019-04-09 DIAGNOSIS — I25810 Atherosclerosis of coronary artery bypass graft(s) without angina pectoris: Secondary | ICD-10-CM | POA: Diagnosis not present

## 2019-04-10 LAB — BASIC METABOLIC PANEL
BUN/Creatinine Ratio: 17 (ref 10–24)
BUN: 19 mg/dL (ref 8–27)
CO2: 24 mmol/L (ref 20–29)
Calcium: 9.7 mg/dL (ref 8.6–10.2)
Chloride: 100 mmol/L (ref 96–106)
Creatinine, Ser: 1.11 mg/dL (ref 0.76–1.27)
GFR calc Af Amer: 77 mL/min/{1.73_m2} (ref >59)
GFR calc non Af Amer: 66 mL/min/{1.73_m2} (ref >59)
Glucose: 92 mg/dL (ref 65–99)
Potassium: 4.3 mmol/L (ref 3.5–5.2)
Sodium: 138 mmol/L (ref 134–144)

## 2019-04-24 ENCOUNTER — Ambulatory Visit: Payer: Medicare Other | Admitting: Emergency Medicine

## 2019-05-01 ENCOUNTER — Other Ambulatory Visit: Payer: Self-pay | Admitting: *Deleted

## 2019-05-01 ENCOUNTER — Telehealth: Payer: Self-pay | Admitting: Interventional Cardiology

## 2019-05-01 DIAGNOSIS — R600 Localized edema: Secondary | ICD-10-CM

## 2019-05-01 MED ORDER — FUROSEMIDE 20 MG PO TABS: 20.0000 mg | ORAL_TABLET | Freq: Every day | ORAL | 1 refills | Status: DC

## 2019-05-01 NOTE — Telephone Encounter (Signed)
Spoke with pt and sent [facility] to [facility] [address] [facility].

## 2019-05-01 NOTE — Telephone Encounter (Signed)
New Message:   Pt needs his medicine transferred to another pharmacy today before the curfew at 8:00 please.   *STAT* If patient is at the pharmacy, call can be transferred to refill team.   1. Which medications need to be refilled? (please list name of each medication and dose if known)  Lasix  2. Which pharmacy/location (including street and city if local pharmacy) is medication to be sent to?   3. Do they need a 30 day or 90 day supply?

## 2019-05-02 ENCOUNTER — Telehealth: Payer: Self-pay | Admitting: Interventional Cardiology

## 2019-05-02 DIAGNOSIS — F1010 Alcohol abuse, uncomplicated: Secondary | ICD-10-CM

## 2019-05-02 DIAGNOSIS — I10 Essential (primary) hypertension: Secondary | ICD-10-CM

## 2019-05-02 NOTE — Telephone Encounter (Signed)
New Message    *STAT* If patient is at the pharmacy, call can be transferred to refill team.   1. Which medications need to be refilled? (please list name of each medication and dose if known) losartan (COZAAR) 25 MG tablet and folic acid (FOLVITE) 1 MG tablet   2. Which pharmacy/location (including street and city if local pharmacy) is medication to be sent to? [facility] [address] [place] - [address]  3. Do they need a 30 day or 90 day supply? 90

## 2019-05-03 MED ORDER — LOSARTAN POTASSIUM 25 MG PO TABS: 25.0000 mg | ORAL_TABLET | Freq: Every day | ORAL | 1 refills | Status: DC

## 2019-05-03 MED ORDER — FOLIC ACID 1 MG PO TABS: 1.0000 mg | ORAL_TABLET | Freq: Every day | ORAL | 0 refills | Status: DC

## 2019-05-03 NOTE — Telephone Encounter (Signed)
Refills sent to pharmacy.  Note on Folic Acid that further refills will need to come from PCP.

## 2019-05-03 NOTE — Telephone Encounter (Signed)
Spoke with pt and advised I have sent prescriptions over to [facility] on [address].  Pt thanked me for the call.

## 2019-05-03 NOTE — Telephone Encounter (Signed)
° ° °  Patient's medication should have been sent to [facility] on [address]. Patient only wants to speak with Constance Holster

## 2019-05-03 NOTE — Telephone Encounter (Signed)
Pt calling requesting a refill on losartan 25 mg tablet and fo9lic acid 1 mg tablet. Dr. Katrinka Blazing did not prescribe these medications. Would Dr. Katrinka Blazing like to refill these medications? Please address

## 2019-05-03 NOTE — Telephone Encounter (Signed)
I am happy to fill them.

## 2019-05-03 NOTE — Addendum Note (Signed)
Addended by: Julio Sicks on: 05/03/2019 01:26 PM   Modules accepted: Orders

## 2019-05-07 DIAGNOSIS — F330 Major depressive disorder, recurrent, mild: Secondary | ICD-10-CM | POA: Diagnosis not present

## 2019-05-07 DIAGNOSIS — F4310 Post-traumatic stress disorder, unspecified: Secondary | ICD-10-CM | POA: Diagnosis not present

## 2019-05-07 DIAGNOSIS — F1011 Alcohol abuse, in remission: Secondary | ICD-10-CM | POA: Diagnosis not present

## 2019-05-07 DIAGNOSIS — F419 Anxiety disorder, unspecified: Secondary | ICD-10-CM | POA: Diagnosis not present

## 2019-05-23 ENCOUNTER — Telehealth: Payer: Self-pay | Admitting: Interventional Cardiology

## 2019-05-23 NOTE — Telephone Encounter (Signed)
New Message    Left voicemail to return my call to confirm appt and answer COVID questions

## 2019-05-23 NOTE — Telephone Encounter (Signed)
Follow up   I sent you a secure chat as well. Patient is returning your call.  Please call.

## 2019-05-23 NOTE — Telephone Encounter (Signed)
Follow up ° ° ° ° °COVID SCREENING QUESTIONS FOR IN-OFFICE VISITS - PLEASE DOCUMENT PATIENT ANSWERS ° °1. Do you currently have a fever? ° °a. NO ° °2. Have you recently traveled on a cruise, internationally, or to [place], [place], [place], [place], [place], or [place], [place] ([employer])? ° °a. NO ° °3. Have you been in contact with someone that is currently pending confirmation of Covid19 testing or has been confirmed to have the Covid19 virus? ° °a. NO ° °4. Are you currently experiencing fatigue or cough? ° °a. NO ° ° °

## 2019-05-23 NOTE — Progress Notes (Signed)
Cardiology Office Note:    Date:  05/24/2019   ID:  [name], [name] 18-Aug-1946, MRN [id number]  PCP:  Horald Pollen, MD  Cardiologist:  Sinclair Grooms, MD   Referring MD: Horald Pollen, *   Chief Complaint  Patient presents with  . Coronary Artery Disease  . Hypertension    History of Present Illness:    [name] is a 73 y.o. male with a hx of CAD s/p CABG x3, with early graft failure and re-do surgery (2006), HTN, HLD,PTSD,obesity with stable chest pain.  [name] is doing relatively well.  He denies orthopnea and PND.  He has lost 15 pounds since mid April.  He feels much better.  He denies claudication.  He has not had syncope.  He denies angina.  Past Medical History:  Diagnosis Date  . Anginal pain ([place]) 01/2005  . Anxiety   . Arthritis    "knees"  . Chronic lower back pain   . Coronary artery disease   . Depression   . Hepatitis    "don't know what kind; they treated me for it; was a long time ago" (12/11/2018)  . High cholesterol   . History of bronchitis    "used to get it alot" (08/25/2012)  . Hypertension   . Panic attacks   . PTSD (post-traumatic stress disorder)    "have been treated in the past" (08/25/2012)    Past Surgical History:  Procedure Laterality Date  . CARDIAC CATHETERIZATION  01/2005  . CORONARY ARTERY BYPASS GRAFT  01/2005   CABG X3  . Shrapnel Left 1969   LLE; left lateral thumb (required grafting)  . VARICOSE VEIN SURGERY  1980's   LLE    Current Medications: Current Meds  Medication Sig  . albuterol (PROVENTIL HFA;VENTOLIN HFA) 108 (90 Base) MCG/ACT inhaler Inhale 2 puffs into the lungs every 4 (four) hours as needed for up to 30 days for wheezing or shortness of breath.  Marland Kitchen FLUoxetine (PROZAC) 20 MG capsule Take 20 mg by mouth daily.  . folic acid (FOLVITE) 1 MG tablet Take 1 tablet (1 mg total) by mouth daily.  . furosemide (LASIX) 20 MG tablet Take 1 tablet (20 mg total) by mouth daily.  Marland Kitchen losartan  (COZAAR) 25 MG tablet Take 1 tablet (25 mg total) by mouth daily.  . metoprolol succinate (TOPROL-XL) 25 MG 24 hr tablet Take 1 tablet (25 mg total) by mouth daily.  . nitroGLYCERIN (NITROSTAT) 0.4 MG SL tablet Place 1 tablet (0.4 mg total) under the tongue every 5 (five) minutes as needed for chest pain.  . pantoprazole (PROTONIX) 40 MG tablet Take 1 tablet (40 mg total) by mouth 2 (two) times daily.     Allergies:   Codeine and Vicodin [hydrocodone-acetaminophen]   Social History   Socioeconomic History  . Marital status: Divorced    Spouse name: Not on file  . Number of children: Not on file  . Years of education: Not on file  . Highest education level: Not on file  Occupational History  . Not on file  Social Needs  . Financial resource strain: Not on file  . Food insecurity    Worry: Not on file    Inability: Not on file  . Transportation needs    Medical: Not on file    Non-medical: Not on file  Tobacco Use  . Smoking status: Former Smoker    Packs/day: 2.00    Years: 5.00  Pack years: 10.00  . Smokeless tobacco: Never Used  . Tobacco comment: 08/25/2012 "quit smoking cigarettes 40 yr ago"  Substance and Sexual Activity  . Alcohol use: Yes    Alcohol/week: 60.0 standard drinks    Types: 60 Standard drinks or equivalent per week    Comment: 12/11/2018 "~ 1 pint of vodka/day"  . Drug use: Yes    Types: Marijuana    Comment: 08/25/2012 "last marijuana 10 years ago"  . Sexual activity: Yes  Lifestyle  . Physical activity    Days per week: Not on file    Minutes per session: Not on file  . Stress: Not on file  Relationships  . Social Musicianconnections    Talks on phone: Not on file    Gets together: Not on file    Attends religious service: Not on file    Active member of club or organization: Not on file    Attends meetings of clubs or organizations: Not on file    Relationship status: Not on file  Other Topics Concern  . Not on file  Social History Narrative  .  Not on file     Family History: The patient's family history includes Cancer in his brother; Diabetes in his mother; Heart disease in his father.  ROS:   Please see the history of present illness.    Appetite is good.  Diet is poor.  All other systems reviewed and are negative.  EKGs/Labs/Other Studies Reviewed:    The following studies were reviewed today: No new data  EKG:  EKG not repeated  Recent Labs: 03/08/2019: B Natriuretic Peptide 295.2 03/21/2019: ALT 56; Hemoglobin CANCELED; Platelets CANCELED 04/09/2019: BUN 19; Creatinine, Ser 1.11; Potassium 4.3; Sodium 138  Recent Lipid Panel    Component Value Date/Time   CHOL 251 (H) 08/26/2012 0500   TRIG 282 (H) 08/26/2012 0500   HDL 36 (L) 08/26/2012 0500   CHOLHDL 7.0 08/26/2012 0500   VLDL 56 (H) 08/26/2012 0500   LDLCALC 159 (H) 08/26/2012 0500    Physical Exam:    VS:  BP (!) 146/78   Pulse (!) 52   Ht 5' 10.25" (1.784 m)   Wt 225 lb (102.1 kg)   SpO2 96%   BMI 32.05 kg/m     Wt Readings from Last 3 Encounters:  05/24/19 225 lb (102.1 kg)  03/21/19 228 lb (103.4 kg)  03/12/19 239 lb (108.4 kg)     GEN: Abdominal obesity. No acute distress HEENT: Normal NECK: No JVD. LYMPHATICS: No lymphadenopathy CARDIAC: RRR.  No murmur, no gallop, no edema.  Previous lower extremity edema has resolved. VASCULAR: 2+ bilateral radial pulses, no bruits RESPIRATORY:  Clear to auscultation without rales, wheezing or rhonchi  ABDOMEN: Soft, non-tender, non-distended, No pulsatile mass, MUSCULOSKELETAL: No deformity  SKIN: Warm and dry NEUROLOGIC:  Alert and oriented x 3 PSYCHIATRIC:  Normal affect   ASSESSMENT:    1. Coronary artery disease involving coronary bypass graft of native heart without angina pectoris   2. Essential hypertension   3. Other hyperlipidemia   4. Chronic diastolic heart failure (HCC)   5. Pedal edema   6. Chronic alcohol abuse   7. Educated About Covid-19 Virus Infection    PLAN:    In  order of problems listed above:  1. Secondary risk protection discussed.  He has a difficult time with the concept.  We need better blood pressure control, moderate aerobic activity, and some means to control lipids.  No recent lipid  panel.  Statin intolerant. 2. Losartan to 50 mg/day for better BP control.  Cmet in 2 weeks.  At that same time he will have a lipid panel 3. His lipids are terrible.  I have referred him to the lipid clinic but he could not afford alternative therapy.  I will check a lipid panel today.  Further management will be dependent upon findings. 4. Improved on diuretic therapy.  May need to add aldosterone receptor antagonist or increase loop diuretic intensity.  Cmet and lipid panel obtained today.  Clinical follow-up in 6 months.  Losartan dose increased.   Medication Adjustments/Labs and Tests Ordered: Current medicines are reviewed at length with the patient today.  Concerns regarding medicines are outlined above.  No orders of the defined types were placed in this encounter.  No orders of the defined types were placed in this encounter.   There are no Patient Instructions on file for this visit.   Signed, Lesleigh NoeHenry W Bryar Dahms III, MD  05/24/2019 4:23 PM    [facility]

## 2019-05-24 ENCOUNTER — Encounter: Payer: Self-pay | Admitting: Interventional Cardiology

## 2019-05-24 ENCOUNTER — Other Ambulatory Visit: Payer: Self-pay

## 2019-05-24 ENCOUNTER — Ambulatory Visit (INDEPENDENT_AMBULATORY_CARE_PROVIDER_SITE_OTHER): Payer: Medicare Other | Admitting: Interventional Cardiology

## 2019-05-24 VITALS — BP 146/78 | HR 52 | Ht 70.25 in | Wt 225.0 lb

## 2019-05-24 DIAGNOSIS — I5032 Chronic diastolic (congestive) heart failure: Secondary | ICD-10-CM

## 2019-05-24 DIAGNOSIS — R600 Localized edema: Secondary | ICD-10-CM

## 2019-05-24 DIAGNOSIS — E7849 Other hyperlipidemia: Secondary | ICD-10-CM | POA: Diagnosis not present

## 2019-05-24 DIAGNOSIS — I25810 Atherosclerosis of coronary artery bypass graft(s) without angina pectoris: Secondary | ICD-10-CM

## 2019-05-24 DIAGNOSIS — F1010 Alcohol abuse, uncomplicated: Secondary | ICD-10-CM

## 2019-05-24 DIAGNOSIS — Z7189 Other specified counseling: Secondary | ICD-10-CM

## 2019-05-24 DIAGNOSIS — I10 Essential (primary) hypertension: Secondary | ICD-10-CM

## 2019-05-24 DIAGNOSIS — F4310 Post-traumatic stress disorder, unspecified: Secondary | ICD-10-CM

## 2019-05-24 MED ORDER — LOSARTAN POTASSIUM 50 MG PO TABS: 50.0000 mg | ORAL_TABLET | Freq: Every day | ORAL | 3 refills | Status: DC

## 2019-05-24 NOTE — Patient Instructions (Signed)
Medication Instructions:  1) INCREASE Losartan to 50mg  once daily  If you need a refill on your cardiac medications before your next appointment, please call your pharmacy.   Lab work: Your physician recommends that you return for lab work in: 2 weeks. You will need to be fasting for these labs (nothing to eat or drink after midnight except water and black coffee).  If you have labs (blood work) drawn today and your tests are completely normal, you will receive your results only by: Marland Kitchen MyChart Message (if you have MyChart) OR . A paper copy in the mail If you have any lab test that is abnormal or we need to change your treatment, we will call you to review the results.  Testing/Procedures: None  Follow-Up: At [facility], you and your health needs are our priority.  As part of our continuing mission to provide you with exceptional heart care, we have created designated Provider Care Teams.  These Care Teams include your primary Cardiologist (physician) and Advanced Practice Providers (APPs -  Physician Assistants and Nurse Practitioners) who all work together to provide you with the care you need, when you need it. You will need a follow up appointment in 6 months.  Please call our office 2 months in advance to schedule this appointment.  You may see Sinclair Grooms, MD or one of the following Advanced Practice Providers on your designated Care Team:   Truitt Merle, NP Cecilie Kicks, NP . Kathyrn Drown, NP  Any Other Special Instructions Will Be Listed Below (If Applicable).

## 2019-05-29 ENCOUNTER — Other Ambulatory Visit: Payer: Self-pay

## 2019-05-29 ENCOUNTER — Telehealth: Payer: Self-pay | Admitting: Interventional Cardiology

## 2019-05-29 MED ORDER — METOPROLOL SUCCINATE ER 25 MG PO TB24: 25.0000 mg | ORAL_TABLET | Freq: Every day | ORAL | 3 refills | Status: DC

## 2019-05-29 NOTE — Telephone Encounter (Signed)
New Message     *STAT* If patient is at the pharmacy, call can be transferred to refill team.   1. Which medications need to be refilled? (please list name of each medication and dose if known) Metoprolol Succinate 25 mg 1 tablet daily  2. Which pharmacy/location (including street and city if local pharmacy) is medication to be sent to? [facility]  3. Do they need a 30 day or 90 day supply? 90 day supply

## 2019-06-05 ENCOUNTER — Ambulatory Visit: Payer: Medicare Other | Admitting: Emergency Medicine

## 2019-06-07 ENCOUNTER — Other Ambulatory Visit: Payer: Medicare Other

## 2019-06-11 ENCOUNTER — Telehealth: Payer: Self-pay | Admitting: Emergency Medicine

## 2019-06-11 NOTE — Telephone Encounter (Signed)
Needs refill  pantoprazole (PROTONIX) 40 MG tablet [[id number]]   folic acid (FOLVITE) 1 MG tablet [[id number]]    [place], [place]. [phone] (Phone) [phone] (Fax)

## 2019-06-15 ENCOUNTER — Emergency Department (HOSPITAL_COMMUNITY)
Admission: EM | Admit: 2019-06-15 | Discharge: 2019-06-16 | Disposition: A | Discharge status: Home / Self Care | Payer: Medicare Other | Attending: Emergency Medicine | Admitting: Emergency Medicine

## 2019-06-15 ENCOUNTER — Encounter (HOSPITAL_COMMUNITY): Payer: Self-pay

## 2019-06-15 DIAGNOSIS — I2510 Atherosclerotic heart disease of native coronary artery without angina pectoris: Secondary | ICD-10-CM | POA: Insufficient documentation

## 2019-06-15 DIAGNOSIS — F329 Major depressive disorder, single episode, unspecified: Secondary | ICD-10-CM

## 2019-06-15 DIAGNOSIS — F1014 Alcohol abuse with alcohol-induced mood disorder: Secondary | ICD-10-CM | POA: Diagnosis present

## 2019-06-15 DIAGNOSIS — Z79899 Other long term (current) drug therapy: Secondary | ICD-10-CM | POA: Diagnosis not present

## 2019-06-15 DIAGNOSIS — F332 Major depressive disorder, recurrent severe without psychotic features: Secondary | ICD-10-CM | POA: Diagnosis not present

## 2019-06-15 DIAGNOSIS — F32A Depression, unspecified: Secondary | ICD-10-CM

## 2019-06-15 DIAGNOSIS — I10 Essential (primary) hypertension: Secondary | ICD-10-CM | POA: Insufficient documentation

## 2019-06-15 DIAGNOSIS — F4310 Post-traumatic stress disorder, unspecified: Secondary | ICD-10-CM | POA: Diagnosis present

## 2019-06-15 DIAGNOSIS — Z951 Presence of aortocoronary bypass graft: Secondary | ICD-10-CM | POA: Insufficient documentation

## 2019-06-15 DIAGNOSIS — Z630 Problems in relationship with spouse or partner: Secondary | ICD-10-CM | POA: Diagnosis not present

## 2019-06-15 DIAGNOSIS — F1010 Alcohol abuse, uncomplicated: Secondary | ICD-10-CM

## 2019-06-15 DIAGNOSIS — Z008 Encounter for other general examination: Secondary | ICD-10-CM | POA: Diagnosis present

## 2019-06-15 DIAGNOSIS — Z87891 Personal history of nicotine dependence: Secondary | ICD-10-CM | POA: Diagnosis not present

## 2019-06-15 DIAGNOSIS — R531 Weakness: Secondary | ICD-10-CM | POA: Diagnosis not present

## 2019-06-15 LAB — CBC WITH DIFFERENTIAL/PLATELET
Abs Immature Granulocytes: 0.01 10*3/uL (ref 0.00–0.07)
Basophils Absolute: 0 10*3/uL (ref 0.0–0.1)
Basophils Relative: 1 %
Eosinophils Absolute: 0.1 10*3/uL (ref 0.0–0.5)
Eosinophils Relative: 2 %
HCT: 35.2 % — ABNORMAL LOW (ref 39.0–52.0)
Hemoglobin: 11.2 g/dL — ABNORMAL LOW (ref 13.0–17.0)
Immature Granulocytes: 0 %
Lymphocytes Relative: 40 %
Lymphs Abs: 1.6 10*3/uL (ref 0.7–4.0)
MCH: 30.1 pg (ref 26.0–34.0)
MCHC: 31.8 g/dL (ref 30.0–36.0)
MCV: 94.6 fL (ref 80.0–100.0)
Monocytes Absolute: 0.3 10*3/uL (ref 0.1–1.0)
Monocytes Relative: 8 %
Neutro Abs: 2 10*3/uL (ref 1.7–7.7)
Neutrophils Relative %: 49 %
Platelets: 98 10*3/uL — ABNORMAL LOW (ref 150–400)
RBC: 3.72 MIL/uL — ABNORMAL LOW (ref 4.22–5.81)
RDW: 14 % (ref 11.5–15.5)
WBC: 4 10*3/uL (ref 4.0–10.5)
nRBC: 0 % (ref 0.0–0.2)

## 2019-06-15 LAB — URINALYSIS, ROUTINE W REFLEX MICROSCOPIC
Bacteria, UA: NONE SEEN
Bilirubin Urine: NEGATIVE
Glucose, UA: NEGATIVE mg/dL
Ketones, ur: NEGATIVE mg/dL
Leukocytes,Ua: NEGATIVE
Nitrite: NEGATIVE
Protein, ur: 100 mg/dL — AB
Specific Gravity, Urine: 1.014 (ref 1.005–1.030)
pH: 5 (ref 5.0–8.0)

## 2019-06-15 LAB — ETHANOL: Alcohol, Ethyl (B): 81 mg/dL — ABNORMAL HIGH (ref <10)

## 2019-06-15 LAB — COMPREHENSIVE METABOLIC PANEL
ALT: 30 U/L (ref 0–44)
AST: 42 U/L — ABNORMAL HIGH (ref 15–41)
Albumin: 4 g/dL (ref 3.5–5.0)
Alkaline Phosphatase: 64 U/L (ref 38–126)
Anion gap: 16 — ABNORMAL HIGH (ref 5–15)
BUN: 17 mg/dL (ref 8–23)
CO2: 22 mmol/L (ref 22–32)
Calcium: 8.7 mg/dL — ABNORMAL LOW (ref 8.9–10.3)
Chloride: 99 mmol/L (ref 98–111)
Creatinine, Ser: 1.3 mg/dL — ABNORMAL HIGH (ref 0.61–1.24)
GFR calc Af Amer: >60 mL/min (ref >60)
GFR calc non Af Amer: 55 mL/min — ABNORMAL LOW (ref >60)
Glucose, Bld: 96 mg/dL (ref 70–99)
Potassium: 3.5 mmol/L (ref 3.5–5.1)
Sodium: 137 mmol/L (ref 135–145)
Total Bilirubin: 0.8 mg/dL (ref 0.3–1.2)
Total Protein: 9.3 g/dL — ABNORMAL HIGH (ref 6.5–8.1)

## 2019-06-15 LAB — RAPID URINE DRUG SCREEN, HOSP PERFORMED
Amphetamines: NOT DETECTED
Barbiturates: NOT DETECTED
Benzodiazepines: POSITIVE — AB
Cocaine: NOT DETECTED
Opiates: NOT DETECTED
Tetrahydrocannabinol: NOT DETECTED

## 2019-06-15 LAB — ACETAMINOPHEN LEVEL: Acetaminophen (Tylenol), Serum: <10 ug/mL — ABNORMAL LOW (ref 10–30)

## 2019-06-15 MED ORDER — FLUOXETINE HCL 20 MG PO CAPS
20.0000 mg | ORAL_CAPSULE | Freq: Every day | ORAL | Status: DC
  Administered 2019-06-15 – 2019-06-16 (×2): 20 mg via ORAL
  Filled 2019-06-15 (×2): qty 1

## 2019-06-15 MED ORDER — LORAZEPAM 2 MG/ML IJ SOLN: 0.0000 mg | Freq: Four times a day (QID) | INTRAMUSCULAR | Status: DC

## 2019-06-15 MED ORDER — METOPROLOL SUCCINATE ER 25 MG PO TB24
25.0000 mg | ORAL_TABLET | Freq: Every day | ORAL | Status: DC
  Administered 2019-06-15 – 2019-06-16 (×2): 25 mg via ORAL
  Filled 2019-06-15 (×2): qty 1

## 2019-06-15 MED ORDER — ALUM & MAG HYDROXIDE-SIMETH 200-200-20 MG/5ML PO SUSP
30.0000 mL | Freq: Four times a day (QID) | ORAL | Status: DC | PRN
  Administered 2019-06-15: 30 mL via ORAL
  Filled 2019-06-15: qty 30

## 2019-06-15 MED ORDER — LORAZEPAM 2 MG/ML IJ SOLN: 0.0000 mg | Freq: Two times a day (BID) | INTRAMUSCULAR | Status: DC

## 2019-06-15 MED ORDER — FOLIC ACID 1 MG PO TABS
1.0000 mg | ORAL_TABLET | Freq: Every day | ORAL | Status: DC
  Administered 2019-06-15 – 2019-06-16 (×2): 1 mg via ORAL
  Filled 2019-06-15 (×2): qty 1

## 2019-06-15 MED ORDER — LORAZEPAM 1 MG PO TABS
0.0000 mg | ORAL_TABLET | Freq: Two times a day (BID) | ORAL | Status: DC
  Administered 2019-06-16: 1 mg via ORAL

## 2019-06-15 MED ORDER — THIAMINE HCL 100 MG/ML IJ SOLN: 100.0000 mg | Freq: Every day | INTRAMUSCULAR | Status: DC

## 2019-06-15 MED ORDER — LOSARTAN POTASSIUM 50 MG PO TABS
50.0000 mg | ORAL_TABLET | Freq: Every day | ORAL | Status: DC
  Administered 2019-06-15 – 2019-06-16 (×2): 50 mg via ORAL
  Filled 2019-06-15 (×2): qty 1

## 2019-06-15 MED ORDER — ONDANSETRON HCL 4 MG PO TABS: 4.0000 mg | ORAL_TABLET | Freq: Three times a day (TID) | ORAL | Status: DC | PRN

## 2019-06-15 MED ORDER — FUROSEMIDE 20 MG PO TABS
20.0000 mg | ORAL_TABLET | Freq: Every day | ORAL | Status: DC
  Administered 2019-06-15 – 2019-06-16 (×2): 20 mg via ORAL
  Filled 2019-06-15 (×2): qty 1

## 2019-06-15 MED ORDER — ZOLPIDEM TARTRATE 5 MG PO TABS: 5.0000 mg | ORAL_TABLET | Freq: Every evening | ORAL | Status: DC | PRN

## 2019-06-15 MED ORDER — NICOTINE 21 MG/24HR TD PT24: 21.0000 mg | MEDICATED_PATCH | Freq: Every day | TRANSDERMAL | Status: DC

## 2019-06-15 MED ORDER — LORAZEPAM 1 MG PO TABS
0.0000 mg | ORAL_TABLET | Freq: Four times a day (QID) | ORAL | Status: DC
  Administered 2019-06-15: 2 mg via ORAL
  Administered 2019-06-15: 1 mg via ORAL
  Filled 2019-06-15 (×4): qty 1

## 2019-06-15 MED ORDER — VITAMIN B-1 100 MG PO TABS
100.0000 mg | ORAL_TABLET | Freq: Every day | ORAL | Status: DC
  Administered 2019-06-15 – 2019-06-16 (×2): 100 mg via ORAL
  Filled 2019-06-15 (×2): qty 1

## 2019-06-15 MED ORDER — PANTOPRAZOLE SODIUM 40 MG PO TBEC
40.0000 mg | DELAYED_RELEASE_TABLET | Freq: Two times a day (BID) | ORAL | Status: DC
  Administered 2019-06-15 – 2019-06-16 (×3): 40 mg via ORAL
  Filled 2019-06-15 (×3): qty 1

## 2019-06-15 NOTE — ED Triage Notes (Addendum)
Patient arrived via [facility] from home.    C/O Generalized weakness, no appetitite, depression X2 days.    Admits to ETOH and not taking his medication for several days.   Denies LOC or falls.  Denies pain.    BP-194/90 P-86 98% RA CBG-97 Temp-97.3   Hx. triple bypass, PTSD, Alcohol abuse, Hypertension, and depression.    A/Ox4.  Ambulatory with ems on scene  NSR with EMS.   Patient placed on 2 litters for comfort by ems.

## 2019-06-15 NOTE — BH Assessment (Signed)
[place] Assessment Progress Note  Case was staffed with Romilda Garret NP who recommended patient be observed and monitored. Psychiatrist to see in the a.m. as medical monitors for withdrawals.

## 2019-06-15 NOTE — ED Notes (Signed)
Pt belongings, 1 bag put in locker 29.

## 2019-06-15 NOTE — ED Notes (Signed)
Patient given meal tray.

## 2019-06-15 NOTE — ED Notes (Signed)
Pt denies SI/HI, A/V hallucinations.

## 2019-06-15 NOTE — Progress Notes (Signed)
Received [name] this PM in his room awake in bed. He endorsed feeling depressed related to his divorce and relapsing on alcohol. He continues to feel mild withdrawal symptoms from the alcohol and anxious.  He drifted off to sleep. His CIWA score was12 and treated with Ativan 2 mg. His B/P was elevated and requested frequent checks (q 15 min) although it was explained to him to relax and allow the medication to take effect. He slept throughout the night and feels better this morning. His B/P is down and he received a small snack.

## 2019-06-15 NOTE — ED Provider Notes (Signed)
  Face-to-face evaluation   History: He presents for evaluation of alcoholism, recurrent drinking, and concern for alcohol withdrawal symptoms.  He has been drinking for 3 weeks and with upset regarding recent divorce.  He did not take his blood pressure medicine for the last 2 days.  He denies chest pain or shortness of breath.  Physical exam: Alert, calm, cooperative.  Mild tremulousness.  No dysarthria or aphasia.  Normal movement of arms and legs bilaterally.  Medical screening examination/treatment/procedure(s) were conducted as a shared visit with non-physician practitioner(s) and myself.  I personally evaluated the patient during the encounter    Daleen Bo, MD 06/15/19 1743

## 2019-06-15 NOTE — BH Assessment (Addendum)
Assessment Note  [name] is an 73 y.o. male that presents this date with S/I. Patient denies any specific plan but reports he has ongoing thoughts of self harm. Patient denies any prior attempts at self harm although reports he was diagnosed with depression "years ago" and just has been "dealing with it" until 6 months ago when he started seeing a provider at [facility]) who prescribed Prozac. Patient states he takes that medication as indicated although feels it is not working with ongoing symptoms to include: feeling useless and isolating. Patient had been maintaining his sobriety from alcohol for over one year until he relapsed 3 months ago and started drinking daily reporting he has been consuming 2 pints of liquor since then with last reported use on 06/14/19 when he reported he consumed 1 pint. Patient denies any other SA use with BAL and UDS pending this date. Patient reports current withdrawals to include tremors, agitation and nausea. Patient denies any H/I although reports active AVH associated with PTSD from the [place] War. Patient states for the last month he has been awaking during the night "hearing explosions and seeing fallen soldiers." Patient states he has recently relocated to live alone after a recent divorce that left him estranged form his family. Patient is a veteran of the [place] War and reports he was diagnosed with PTSD in 1970 and has been receiving services from the [place] in [place] although patient cannot recall the last time he met with that provider. Patient renders limited history and finds it difficult to recall his treatment history. Patient is observed to be tremulous and has to be prompted to gather history. Per notes patient has a history of alcohol abuse, CAD, anxiety, depression, PTSD brought here by EMS from home for complaints of alcohol abuse and feeling depressed. Patient states he is going through renewed symptoms associated with PTSD that has  been in remission as well as finalizing his divorce recently. Patient states he consumes upwards to 2 pints of liquor per day with last use was this morning. He would like to quit drinking and he is concern of withdrawal symptoms. He also feels increased depression and states he does not have the will to live but does not have any overt suicidal plan or homicidal ideation. He admits to having visual and auditory hallucination without any command hallucination. Case was staffed with Romilda Garret NP who recommended patient be observed and monitored. Psychiatrist to see in the a.m. as medical monitors for withdrawals.     Diagnosis: F33.2 MDD recurrent with psychotic features, severe. Alcohol abuse  Past Medical History:  Past Medical History:  Diagnosis Date  . Anginal pain ([place]) 01/2005  . Anxiety   . Arthritis    "knees"  . Chronic lower back pain   . Coronary artery disease   . Depression   . Hepatitis    "don't know what kind; they treated me for it; was a long time ago" (12/11/2018)  . High cholesterol   . History of bronchitis    "used to get it alot" (08/25/2012)  . Hypertension   . Panic attacks   . PTSD (post-traumatic stress disorder)    "have been treated in the past" (08/25/2012)    Past Surgical History:  Procedure Laterality Date  . CARDIAC CATHETERIZATION  01/2005  . CORONARY ARTERY BYPASS GRAFT  01/2005   CABG X3  . Shrapnel Left 1969   LLE; left lateral thumb (required grafting)  . VARICOSE VEIN SURGERY  1980's   LLE    Family History:  Family History  Problem Relation Age of Onset  . Diabetes Mother   . Heart disease Father   . Cancer Brother     Social History:  reports that he has quit smoking. He has a 10.00 pack-year smoking history. He has never used smokeless tobacco. He reports current alcohol use of about 60.0 standard drinks of alcohol per week. He reports current drug use. Drug: Marijuana.  Additional Social History:  Alcohol / Drug Use Pain Medications:  See MAR Prescriptions: See MAR Over the Counter: See MAR History of alcohol / drug use?: Yes Longest period of sobriety (when/how long): 3 months Negative Consequences of Use: (Denies) Withdrawal Symptoms: (Denies) Substance #1 Name of Substance 1: Alcohol 1 - Age of First Use: 18 1 - Amount (size/oz): Varies 1 - Frequency: Varies 1 - Duration: Ongoing 1 - Last Use / Amount: 06/14/19  CIWA: CIWA-Ar BP: (!) 168/79 Pulse Rate: 65 Nausea and Vomiting: mild nausea with no vomiting Tactile Disturbances: very mild itching, pins and needles, burning or numbness Tremor: no tremor Auditory Disturbances: not present Paroxysmal Sweats: barely perceptible sweating, palms moist Visual Disturbances: not present Anxiety: mildly anxious Headache, Fullness in Head: moderate Agitation: normal activity Orientation and Clouding of Sensorium: oriented and can do serial additions CIWA-Ar Total: 7 COWS:    Allergies:  Allergies  Allergen Reactions  . Codeine Palpitations  . Vicodin [Hydrocodone-Acetaminophen] Nausea And Vomiting, Swelling and Palpitations    Home Medications: (Not in a hospital admission)   OB/GYN Status:  No LMP for male patient.  General Assessment Data Location of Assessment: [facility] TTS Assessment: In system Is this a Tele or Face-to-Face Assessment?: Face-to-Face Is this an Initial Assessment or a Re-assessment for this encounter?: Initial Assessment Patient Accompanied by:: N/A Language Other than English: No Living Arrangements: Other (Comment) What gender do you identify as?: Male Marital status: Divorced Living Arrangements: Alone Can pt return to current living arrangement?: Yes Admission Status: Voluntary Is patient capable of signing voluntary admission?: Yes Referral Source: Self/Family/Friend Insurance type: Medicare  Medical Screening Exam ([place]) Medical Exam completed: Yes  Crisis Care Plan Living Arrangements: Alone Legal Guardian:  (NA) Name of Psychiatrist: [facility] ([facility]) Name of Therapist: None  Education Status Is patient currently in school?: No Is the patient employed, unemployed or receiving disability?: Unemployed  Risk to self with the past 6 months Suicidal Ideation: Yes-Currently Present Has patient been a risk to self within the past 6 months prior to admission? : No Suicidal Intent: Yes-Currently Present Has patient had any suicidal intent within the past 6 months prior to admission? : No Is patient at risk for suicide?: Yes Suicidal Plan?: No Has patient had any suicidal plan within the past 6 months prior to admission? : No Access to Means: No What has been your use of drugs/alcohol within the last 12 months?: Current use Previous Attempts/Gestures: No How many times?: 0 Other Self Harm Risks: (Excessive SA use) Triggers for Past Attempts: (NA) Intentional Self Injurious Behavior: None Family Suicide History: No Recent stressful life event(s): Divorce Persecutory voices/beliefs?: No Depression: Yes Depression Symptoms: Feeling worthless/self pity Substance abuse history and/or treatment for substance abuse?: No Suicide prevention information given to non-admitted patients: Not applicable  Risk to Others within the past 6 months Homicidal Ideation: No Does patient have any lifetime risk of violence toward others beyond the six months prior to admission? : No Thoughts of Harm to Others:  No Current Homicidal Intent: No Current Homicidal Plan: No Access to Homicidal Means: No Identified Victim: NA History of harm to others?: No Assessment of Violence: None Noted Violent Behavior Description: NA Does patient have access to weapons?: No Criminal Charges Pending?: No Does patient have a court date: No Is patient on probation?: No  Psychosis Hallucinations: Auditory, Visual Delusions: None noted  Mental Status Report Appearance/Hygiene: Unremarkable Eye Contact: Good Motor  Activity: Freedom of movement Speech: Logical/coherent Level of Consciousness: Alert Mood: Depressed Affect: Anxious Anxiety Level: Moderate Thought Processes: Coherent, Relevant Judgement: Partial Orientation: Person, Place, Time Obsessive Compulsive Thoughts/Behaviors: None  Cognitive Functioning Concentration: Normal Memory: Recent Intact, Remote Intact Is patient IDD: No Insight: Fair Impulse Control: Poor Appetite: Good Have you had any weight changes? : No Change Sleep: Decreased Total Hours of Sleep: 3 Vegetative Symptoms: None  ADLScreening [facility] Assessment Services) Patient's cognitive ability adequate to safely complete daily activities?: Yes Patient able to express need for assistance with ADLs?: Yes Independently performs ADLs?: Yes (appropriate for developmental age)  Prior Inpatient Therapy Prior Inpatient Therapy: No  Prior Outpatient Therapy Prior Outpatient Therapy: Yes Prior Therapy Dates: Current  Prior Therapy Facilty/Provider(s): [facility] Reason for Treatment: Med mang Does patient have an ACCT team?: No Does patient have Intensive In-House Services?  : No Does patient have [facility] services? : No Does patient have P4CC services?: No  ADL Screening (condition at time of admission) Patient's cognitive ability adequate to safely complete daily activities?: Yes Is the patient deaf or have difficulty hearing?: No Does the patient have difficulty seeing, even when wearing glasses/contacts?: No Does the patient have difficulty concentrating, remembering, or making decisions?: No Patient able to express need for assistance with ADLs?: Yes Does the patient have difficulty dressing or bathing?: No Independently performs ADLs?: Yes (appropriate for developmental age) Does the patient have difficulty walking or climbing stairs?: No Weakness of Legs: None Weakness of Arms/Hands: None  Home Assistive Devices/Equipment Home Assistive Devices/Equipment:  None  Therapy Consults (therapy consults require a physician order) PT Evaluation Needed: No OT Evalulation Needed: No SLP Evaluation Needed: No Abuse/Neglect Assessment (Assessment to be complete while patient is alone) Physical Abuse: Denies Verbal Abuse: Denies Sexual Abuse: Denies Exploitation of patient/patient's resources: Denies Self-Neglect: Denies Values / Beliefs Cultural Requests During Hospitalization: None Spiritual Requests During Hospitalization: None Consults Spiritual Care Consult Needed: No Social Work Consult Needed: No Regulatory affairs officer (For Healthcare) Does Patient Have a Medical Advance Directive?: No Would patient like information on creating a medical advance directive?: No - Patient declined          Disposition: Case was staffed with Romilda Garret NP who recommended patient be observed and monitored. Psychiatrist to see in the a.m. as medical monitors for withdrawals.    Disposition Initial Assessment Completed for this Encounter: Yes Disposition of Patient: Admit Type of inpatient treatment program: Adult Patient refused recommended treatment: No Mode of transportation if patient is discharged/movement?: Tomasita Crumble)  On Site Evaluation by:   Reviewed with Physician:    Mamie Nick 06/15/2019 12:30 PM

## 2019-06-15 NOTE — ED Notes (Signed)
Patient dressed out into scrubs. Patient denies SI or HI. Patient states he just feels "hopeless".

## 2019-06-15 NOTE — ED Notes (Signed)
Per TSS. Dress out patient and keep overnight.

## 2019-06-15 NOTE — ED Notes (Signed)
Pt to room 29. Oriented to unit. Pt calm, cooperative.

## 2019-06-15 NOTE — ED Provider Notes (Signed)
[facility]-EMERGENCY DEPT Provider Note   CSN: [id number] Arrival date & time: 06/15/19  0946     History   Chief Complaint Chief Complaint  Patient presents with  . Fatigue  . Depression    HPI [name] is a 73 y.o. male.     The history is provided by the patient and medical records. No language interpreter was used.  Depression     73 year old male with history of alcohol abuse, CAD, anxiety, depression, PTSD brought here via EMS from home for complaints of alcohol abuse and feeling depressed.  Patient states he is going through problem with PTSD as well as finalizing his divorce recently.  Due to that, he has resumed drinking alcohol heavily within the past 2 to 3 weeks.  States he consumes upwards to 2 pints of liquor per day last use was this morning.  He would like to quit drinking and he is concern of withdrawal symptoms.  He also feel increased depressed and states he does not have the will to live but does not have any overt suicidal plan or homicidal ideation.  He admits to having visual and auditory hallucination without any command hallucination.  He does not complain of any active pain aside from heartburn.  States that he has been eating for the past few days due to not having much of an appetite.  No complaints of fever or COVID-19 symptom.  No nausea vomiting or diarrhea.  Past Medical History:  Diagnosis Date  . Anginal pain ([facility]) 01/2005  . Anxiety   . Arthritis    "knees"  . Chronic lower back pain   . Coronary artery disease   . Depression   . Hepatitis    "don't know what kind; they treated me for it; was a long time ago" (12/11/2018)  . High cholesterol   . History of bronchitis    "used to get it alot" (08/25/2012)  . Hypertension   . Panic attacks   . PTSD (post-traumatic stress disorder)    "have been treated in the past" (08/25/2012)    Patient Active Problem List   Diagnosis Date Noted  . Educated About Covid-19  Virus Infection 03/12/2019  . DOE (dyspnea on exertion) 03/05/2019  . Thrombocytopenia ([facility]) 02/24/2019  . Abnormal liver function tests 02/24/2019  . Hypertensive urgency 12/11/2018  . Chronic alcohol abuse 12/09/2015  . Coronary artery disease involving coronary bypass graft of native heart without angina pectoris 08/07/2014  . Essential hypertension 08/07/2014  . Hyperlipidemia 08/07/2014    Past Surgical History:  Procedure Laterality Date  . CARDIAC CATHETERIZATION  01/2005  . CORONARY ARTERY BYPASS GRAFT  01/2005   CABG X3  . Shrapnel Left 1969   LLE; left lateral thumb (required grafting)  . VARICOSE VEIN SURGERY  1980's   LLE        Home Medications    Prior to Admission medications   Medication Sig Start Date End Date Taking? Authorizing Provider  albuterol (PROVENTIL HFA;VENTOLIN HFA) 108 (90 Base) MCG/ACT inhaler Inhale 2 puffs into the lungs every 4 (four) hours as needed for up to 30 days for wheezing or shortness of breath. 03/08/19 05/24/19  Arlyn DunningMcLean, Kelly A, PA-C  FLUoxetine (PROZAC) 20 MG capsule Take 20 mg by mouth daily.    [provider]  folic acid (FOLVITE) 1 MG tablet Take 1 tablet (1 mg total) by mouth daily. 05/03/19   Lyn RecordsSmith, Henry W, MD  furosemide (LASIX) 20 MG tablet Take  1 tablet (20 mg total) by mouth daily. 05/01/19   Belva Crome, MD  losartan (COZAAR) 50 MG tablet Take 1 tablet (50 mg total) by mouth daily. 05/24/19 08/22/19  Belva Crome, MD  metoprolol succinate (TOPROL-XL) 25 MG 24 hr tablet Take 1 tablet (25 mg total) by mouth daily. 05/29/19   Belva Crome, MD  nitroGLYCERIN (NITROSTAT) 0.4 MG SL tablet Place 1 tablet (0.4 mg total) under the tongue every 5 (five) minutes as needed for chest pain. 01/24/19   Horald Pollen, MD  pantoprazole (PROTONIX) 40 MG tablet Take 1 tablet (40 mg total) by mouth 2 (two) times daily. 02/26/19   Kathi Ludwig, MD    Family History Family History  Problem Relation Age of Onset  . Diabetes  Mother   . Heart disease Father   . Cancer Brother     Social History Social History   Tobacco Use  . Smoking status: Former Smoker    Packs/day: 2.00    Years: 5.00    Pack years: 10.00  . Smokeless tobacco: Never Used  . Tobacco comment: 08/25/2012 "quit smoking cigarettes 40 yr ago"  Substance Use Topics  . Alcohol use: Yes    Alcohol/week: 60.0 standard drinks    Types: 60 Standard drinks or equivalent per week    Comment: 12/11/2018 "~ 1 pint of vodka/day"  . Drug use: Yes    Types: Marijuana    Comment: 08/25/2012 "last marijuana 10 years ago"     Allergies   Codeine and Vicodin [hydrocodone-acetaminophen]   Review of Systems Review of Systems  Psychiatric/Behavioral: Positive for depression.  All other systems reviewed and are negative.    Physical Exam Updated Vital Signs BP (!) 159/70   Pulse 63   Temp 97.9 F (36.6 C) (Oral)   Resp 18   SpO2 98%   Physical Exam Vitals signs and nursing note reviewed.  Constitutional:      General: He is not in acute distress.    Appearance: He is well-developed.  HENT:     Head: Atraumatic.  Eyes:     Conjunctiva/sclera: Conjunctivae normal.  Neck:     Musculoskeletal: Neck supple.  Cardiovascular:     Rate and Rhythm: Normal rate and regular rhythm.     Pulses: Normal pulses.     Heart sounds: Normal heart sounds.  Pulmonary:     Effort: Pulmonary effort is normal.  Abdominal:     Palpations: Abdomen is soft.     Tenderness: There is no abdominal tenderness.  Skin:    Findings: No rash.  Neurological:     Mental Status: He is alert.     GCS: GCS eye subscore is 4. GCS verbal subscore is 5. GCS motor subscore is 6.  Psychiatric:        Attention and Perception: Attention normal.        Mood and Affect: Mood is depressed.        Speech: Speech normal.        Behavior: Behavior normal. Behavior is cooperative.        Thought Content: Thought content does not include homicidal or suicidal ideation.       ED Treatments / Results  Labs (all labs ordered are listed, but only abnormal results are displayed) Labs Reviewed  COMPREHENSIVE METABOLIC PANEL - Abnormal; Notable for the following components:      Result Value   Creatinine, Ser 1.30 (*)    Calcium 8.7 (*)  Total Protein 9.3 (*)    AST 42 (*)    GFR calc non Af Amer 55 (*)    Anion gap 16 (*)    All other components within normal limits  ACETAMINOPHEN LEVEL - Abnormal; Notable for the following components:   Acetaminophen (Tylenol), Serum <10 (*)    All other components within normal limits  ETHANOL - Abnormal; Notable for the following components:   Alcohol, Ethyl (B) 81 (*)    All other components within normal limits  CBC WITH DIFFERENTIAL/PLATELET - Abnormal; Notable for the following components:   RBC 3.72 (*)    Hemoglobin 11.2 (*)    HCT 35.2 (*)    Platelets 98 (*)    All other components within normal limits  RAPID URINE DRUG SCREEN, HOSP PERFORMED - Abnormal; Notable for the following components:   Benzodiazepines POSITIVE (*)    All other components within normal limits  URINALYSIS, ROUTINE W REFLEX MICROSCOPIC - Abnormal; Notable for the following components:   Hgb urine dipstick SMALL (*)    Protein, ur 100 (*)    All other components within normal limits    EKG None  Radiology No results found.  Procedures Procedures (including critical care time)  Medications Ordered in ED Medications  LORazepam (ATIVAN) injection 0-4 mg ( Intravenous See Alternative 06/15/19 1132)    Or  LORazepam (ATIVAN) tablet 0-4 mg (1 mg Oral Given 06/15/19 1132)  LORazepam (ATIVAN) injection 0-4 mg (has no administration in time range)    Or  LORazepam (ATIVAN) tablet 0-4 mg (has no administration in time range)  thiamine (VITAMIN B-1) tablet 100 mg (100 mg Oral Given 06/15/19 1132)    Or  thiamine (B-1) injection 100 mg ( Intravenous See Alternative 06/15/19 1132)  zolpidem (AMBIEN) tablet 5 mg (has no administration  in time range)  ondansetron (ZOFRAN) tablet 4 mg (has no administration in time range)  alum & mag hydroxide-simeth (MAALOX/MYLANTA) 200-200-20 MG/5ML suspension 30 mL (30 mLs Oral Given 06/15/19 1137)  nicotine (NICODERM CQ - dosed in mg/24 hours) patch 21 mg (21 mg Transdermal Refused 06/15/19 1133)  FLUoxetine (PROZAC) capsule 20 mg (20 mg Oral Given 06/15/19 1323)  folic acid (FOLVITE) tablet 1 mg (1 mg Oral Given 06/15/19 1323)  furosemide (LASIX) tablet 20 mg (20 mg Oral Given 06/15/19 1324)  losartan (COZAAR) tablet 50 mg (50 mg Oral Given 06/15/19 1325)  metoprolol succinate (TOPROL-XL) 24 hr tablet 25 mg (25 mg Oral Given 06/15/19 1326)  pantoprazole (PROTONIX) EC tablet 40 mg (40 mg Oral Given 06/15/19 1323)     Initial Impression / Assessment and Plan / ED Course  I have reviewed the triage vital signs and the nursing notes.  Pertinent labs & imaging results that were available during my care of the patient were reviewed by me and considered in my medical decision making (see chart for details).        BP (!) 159/70   Pulse 63   Temp 97.9 F (36.6 C) (Oral)   Resp 18   SpO2 98%    Final Clinical Impressions(s) / ED Diagnoses   Final diagnoses:  Depression, unspecified depression type  Alcohol abuse    ED Discharge Orders    None     11:17 AM Patient is here requesting help with his alcohol abuse.  He also endorsed feeling increasingly depressed as of his life is of no value.  He does not overtly states that he is suicidal or homicidal.  His last  alcohol use was today.  Does have known history of alcohol abuse.  Given his age and his medical history, will consult TTS for further evaluation and management of his condition.  2:56 PM Alcohol level is 81.  CIWA protocol is in place.  Mild AKI with Cr 1.3.  Encourage fluid intake.  Pt otherwise medically cleared.  TTS has evaluated pt and recommend overnight obs and reassess by psychiatry in the AM.  Care discussed with Dr.  Effie ShyWentz.  PT with elevated BP, will give home BP medications.    Fayrene Helperran, Zigmund Linse, PA-C 06/15/19 1458    Mancel BaleWentz, Elliott, MD 06/15/19 1743

## 2019-06-16 ENCOUNTER — Other Ambulatory Visit: Payer: Self-pay

## 2019-06-16 DIAGNOSIS — Z87891 Personal history of nicotine dependence: Secondary | ICD-10-CM

## 2019-06-16 DIAGNOSIS — F1014 Alcohol abuse with alcohol-induced mood disorder: Secondary | ICD-10-CM | POA: Diagnosis present

## 2019-06-16 DIAGNOSIS — F4310 Post-traumatic stress disorder, unspecified: Secondary | ICD-10-CM | POA: Diagnosis present

## 2019-06-16 DIAGNOSIS — Z630 Problems in relationship with spouse or partner: Secondary | ICD-10-CM

## 2019-06-16 MED ORDER — PANTOPRAZOLE SODIUM 40 MG PO TBEC: 40.0000 mg | DELAYED_RELEASE_TABLET | Freq: Two times a day (BID) | ORAL | 0 refills | Status: DC

## 2019-06-16 MED ORDER — FLUOXETINE HCL 20 MG PO CAPS: 20.0000 mg | ORAL_CAPSULE | Freq: Every day | ORAL | Status: DC

## 2019-06-16 MED ORDER — GABAPENTIN 300 MG PO CAPS: 300.0000 mg | ORAL_CAPSULE | Freq: Two times a day (BID) | ORAL | Status: DC

## 2019-06-16 MED ORDER — FOLIC ACID 1 MG PO TABS: 1.0000 mg | ORAL_TABLET | Freq: Every day | ORAL | 0 refills | Status: DC

## 2019-06-16 MED ORDER — GABAPENTIN 100 MG PO CAPS: 100.0000 mg | ORAL_CAPSULE | Freq: Three times a day (TID) | ORAL | 0 refills | Status: DC

## 2019-06-16 NOTE — Telephone Encounter (Signed)
Pantoprazole 40 mg and folvite sent to [facility] on [address]. Dgaddy, CMA

## 2019-06-16 NOTE — ED Notes (Signed)
Pt discharged safely with RX called in.  Pt was in no distress and showed not active symptoms of withdrawal.  All belongings were returned to pt.

## 2019-06-16 NOTE — Consult Note (Addendum)
[facility]  06/16/2019 11:57 AM Ronald SciaraJoseph M [name]  MRN:  [id number] Principal Problem: Alcohol abuse with alcohol-induced mood disorder [facility](HCC) Discharge Diagnoses: Principal Problem:   Alcohol abuse with alcohol-induced mood disorder (HCC) Active Problems:   PTSD (post-traumatic stress disorder)   Subjective: Pt was seen and chart reviewed with treatment team and Dr Jannifer FranklinAkintayo. Pt denies suicidal/homicidal ideation, denies auditory/visual hallucinations and does not appear to be responding to internal stimuli. Pt stated he has been drinking more lately because of going through a bad divorce. 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 He came to the hospital because he was thinking of self harm. He sees a therapist at [facility] and he is prescribed Prozac, which he takes each day. He was prescribed Gabapentin upon discharge which will help with his anxiety and also may help with sleep. Pt stated he has not been sleeping well. Pt's UDS on admission was 81, UDS negative. Pt will continue to follow up with [facility] after discharge. Pt is psychiatrically clear.   Total Time spent with patient: 30 minutes  Past Psychiatric History: As above  Past Medical History:  Past Medical History:  Diagnosis Date  . Anginal pain (HCC) 01/2005  . Anxiety   . Arthritis    "knees"  . Chronic lower back pain   . Coronary artery disease   . Depression   . Hepatitis    "don't know what kind; they treated me for it; was a long time ago" (12/11/2018)  . High cholesterol   . History of bronchitis    "used to get it alot" (08/25/2012)  . Hypertension   . Panic attacks   . PTSD (post-traumatic stress disorder)    "have been treated in the past" (08/25/2012)    Past Surgical History:  Procedure Laterality Date  . CARDIAC CATHETERIZATION  01/2005  . CORONARY ARTERY BYPASS GRAFT  01/2005   CABG X3  . Shrapnel Left 1969   LLE; left lateral thumb (required grafting)  . VARICOSE VEIN  SURGERY  1980's   LLE   Family History:  Family History  Problem Relation Age of Onset  . Diabetes Mother   . Heart disease Father   . Cancer Brother    Family Psychiatric  History: As above Social History:  Social History   Substance and Sexual Activity  Alcohol Use Yes  . Alcohol/week: 60.0 standard drinks  . Types: 60 Standard drinks or equivalent per week   Comment: 12/11/2018 "~ 1 pint of vodka/day"     Social History   Substance and Sexual Activity  Drug Use Yes  . Types: Marijuana   Comment: 08/25/2012 "last marijuana 10 years ago"    Social History   Socioeconomic History  . Marital status: Divorced    Spouse name: Not on file  . Number of children: Not on file  . Years of education: Not on file  . Highest education level: Not on file  Occupational History  . Not on file  Social Needs  . Financial resource strain: Not on file  . Food insecurity    Worry: Not on file    Inability: Not on file  . Transportation needs    Medical: Not on file    Non-medical: Not on file  Tobacco Use  . Smoking status: Former Smoker    Packs/day: 2.00    Years: 5.00    Pack years: 10.00  . Smokeless tobacco: Never Used  . Tobacco comment:  08/25/2012 "quit smoking cigarettes 40 yr ago"  Substance and Sexual Activity  . Alcohol use: Yes    Alcohol/week: 60.0 standard drinks    Types: 60 Standard drinks or equivalent per week    Comment: 12/11/2018 "~ 1 pint of vodka/day"  . Drug use: Yes    Types: Marijuana    Comment: 08/25/2012 "last marijuana 10 years ago"  . Sexual activity: Yes  Lifestyle  . Physical activity    Days per week: Not on file    Minutes per session: Not on file  . Stress: Not on file  Relationships  . Social Herbalist on phone: Not on file    Gets together: Not on file    Attends religious service: Not on file    Active member of club or organization: Not on file    Attends meetings of clubs or organizations: Not on file    Relationship  status: Not on file  Other Topics Concern  . Not on file  Social History Narrative  . Not on file    Has this patient used any form of tobacco in the last 30 days? (Cigarettes, Smokeless Tobacco, Cigars, and/or Pipes) Prescription not provided because: Pt declines   Current Medications: Current Facility-Administered Medications  Medication Dose Route Frequency Provider Last Rate Last Dose  . alum & mag hydroxide-simeth (MAALOX/MYLANTA) 200-200-20 MG/5ML suspension 30 mL  30 mL Oral Q6H PRN Domenic Moras, PA-C   30 mL at 06/15/19 1137  . FLUoxetine (PROZAC) capsule 20 mg  20 mg Oral Daily Domenic Moras, PA-C   20 mg at 06/16/19 1031  . folic acid (FOLVITE) tablet 1 mg  1 mg Oral Daily Domenic Moras, PA-C   1 mg at 06/16/19 1031  . furosemide (LASIX) tablet 20 mg  20 mg Oral Daily Domenic Moras, PA-C   20 mg at 06/16/19 1032  . gabapentin (NEURONTIN) capsule 300 mg  300 mg Oral BID Rilan Eiland, MD      . LORazepam (ATIVAN) injection 0-4 mg  0-4 mg Intravenous Q6H Domenic Moras, PA-C       Or  . LORazepam (ATIVAN) tablet 0-4 mg  0-4 mg Oral Q6H Domenic Moras, PA-C   2 mg at 06/15/19 2234  . [START ON 06/17/2019] LORazepam (ATIVAN) injection 0-4 mg  0-4 mg Intravenous Q12H Domenic Moras, PA-C       Or  . Derrill Memo ON 06/17/2019] LORazepam (ATIVAN) tablet 0-4 mg  0-4 mg Oral Q12H Domenic Moras, PA-C   1 mg at 06/16/19 0554  . losartan (COZAAR) tablet 50 mg  50 mg Oral Daily Domenic Moras, PA-C   50 mg at 06/16/19 1032  . metoprolol succinate (TOPROL-XL) 24 hr tablet 25 mg  25 mg Oral Daily Domenic Moras, PA-C   25 mg at 06/16/19 1031  . nicotine (NICODERM CQ - dosed in mg/24 hours) patch 21 mg  21 mg Transdermal Daily Domenic Moras, PA-C      . ondansetron [facility]) tablet 4 mg  4 mg Oral Q8H PRN Domenic Moras, PA-C      . pantoprazole (PROTONIX) EC tablet 40 mg  40 mg Oral BID Domenic Moras, PA-C   40 mg at 06/16/19 1031  . thiamine (VITAMIN B-1) tablet 100 mg  100 mg Oral Daily Domenic Moras, PA-C   100 mg at 06/16/19  1124   Or  . thiamine (B-1) injection 100 mg  100 mg Intravenous Daily Domenic Moras, PA-C       Current  Outpatient Medications  Medication Sig Dispense Refill  . FLUoxetine (PROZAC) 20 MG capsule Take 20 mg by mouth daily.    . folic acid (FOLVITE) 1 MG tablet Take 1 tablet (1 mg total) by mouth daily. 30 tablet 0  . furosemide (LASIX) 20 MG tablet Take 1 tablet (20 mg total) by mouth daily. 90 tablet 1  . losartan (COZAAR) 50 MG tablet Take 1 tablet (50 mg total) by mouth daily. 90 tablet 3  . metoprolol succinate (TOPROL-XL) 25 MG 24 hr tablet Take 1 tablet (25 mg total) by mouth daily. 90 tablet 3  . pantoprazole (PROTONIX) 40 MG tablet Take 1 tablet (40 mg total) by mouth 2 (two) times daily. 60 tablet 1  . albuterol (PROVENTIL HFA;VENTOLIN HFA) 108 (90 Base) MCG/ACT inhaler Inhale 2 puffs into the lungs every 4 (four) hours as needed for up to 30 days for wheezing or shortness of breath. (Patient not taking: Reported on 06/15/2019) 1 Inhaler 0  . nitroGLYCERIN (NITROSTAT) 0.4 MG SL tablet Place 1 tablet (0.4 mg total) under the tongue every 5 (five) minutes as needed for chest pain. (Patient not taking: Reported on 06/15/2019) 25 tablet 3    Musculoskeletal: Strength & Muscle Tone: within normal limits Gait & Station: normal Patient leans: N/A  Psychiatric Specialty Exam: Physical Exam  Constitutional: He is oriented to person, place, and time. He appears well-developed and well-nourished.  HENT:  Head: Normocephalic.  Musculoskeletal: Normal range of motion.  Neurological: He is alert and oriented to person, place, and time.  Psychiatric: His speech is normal and behavior is normal. Judgment and thought content normal. Cognition and memory are normal. He exhibits a depressed mood.    Review of Systems  Psychiatric/Behavioral: Positive for depression and substance abuse.  All other systems reviewed and are negative.   Blood pressure (!) 138/59, pulse 63, temperature 98.3 F (36.8  C), temperature source Oral, resp. rate 16, SpO2 96 %.There is no height or weight on file to calculate BMI.  General Appearance: Pt assessed via telephone due to telecart not working  [employer] Contact:  Pt assessed via telephone due to telecart not working  Speech:  Clear and Coherent and Normal Rate  Volume:  Normal  Mood:  Depressed  Affect:  Congruent and Depressed  Thought Process:  Coherent and Descriptions of Associations: Intact  Orientation:  Full (Time, Place, and Person)  Thought Content:  Logical  Suicidal Thoughts:  No  Homicidal Thoughts:  No  Memory:  Immediate;   Good Recent;   Good Remote;   Fair  Judgement:  Fair  Insight:  Fair  Psychomotor Activity:  Pt assessed via telephone due to telecart not working  Concentration:  Concentration: Good and Attention Span: Good  Recall:  Good  Fund of Knowledge:  Good  Language:  Good  Akathisia:  Negative  Handed:  Right  AIMS (if indicated):     Assets:  ArchitectCommunication Skills Financial Resources/Insurance Housing Social Support  ADL's:  Intact  Cognition:  WNL  Sleep:        Demographic Factors:  Male, Age 72 or older and Divorced or widowed  Loss Factors: Loss of significant relationship  Historical Factors: Family history of mental illness or substance abuse  Risk Reduction Factors:   Sense of responsibility to family  Continued Clinical Symptoms:  Alcohol/Substance Abuse/Dependencies  Cognitive Features That Contribute To Risk:  Closed-mindedness    Suicide Risk:  Minimal: No identifiable suicidal ideation.  Patients presenting with no risk  factors but with morbid ruminations; may be classified as minimal risk based on the severity of the depressive symptoms    Plan Of Care/Follow-up recommendations:  Activity:  as tolerated Diet:  Heart Healthy  Disposition and Treatment Plan: Alcohol abuse with alcohol-induced mood disorder (HCC) Take all medications as prescribed. [facility] on [address] Keep all follow-up appointments as scheduled. Follow up with [facility] Do not consume alcohol or use illegal drugs while on prescription medications. Report any adverse effects from your medications to your primary care provider promptly.  In the event of recurrent symptoms or worsening symptoms, call 911, a crisis hotline, or go to the nearest emergency department for evaluation.   Laveda AbbeLaurie Britton Parks, NP 06/16/2019, 11:57 AM  Patient seen face-to-face for psychiatric evaluation, chart reviewed and case discussed with the physician extender and developed treatment plan. Reviewed the information documented and agree with the treatment plan. Thedore MinsMojeed Kert Shackett, MD

## 2019-06-16 NOTE — Progress Notes (Signed)
Pantoprazole 40 mg #60 with 0 refills approved.  Last ov 03/21/2019 , 03/29/2019 with Sagardia. Dgaddy, CMA

## 2019-06-18 ENCOUNTER — Telehealth: Payer: Self-pay | Admitting: Emergency Medicine

## 2019-06-18 NOTE — Telephone Encounter (Signed)
Pt is needing medical records faxed over to [place] we have done thia before pt has already signed release if questions please call [phone]

## 2019-06-19 ENCOUNTER — Ambulatory Visit: Payer: Medicare Other | Admitting: Emergency Medicine

## 2019-06-19 ENCOUNTER — Telehealth: Payer: Medicare Other | Admitting: Interventional Cardiology

## 2019-06-19 ENCOUNTER — Encounter (HOSPITAL_COMMUNITY): Payer: Self-pay | Admitting: *Deleted

## 2019-06-19 ENCOUNTER — Emergency Department (HOSPITAL_COMMUNITY): Payer: Medicare Other

## 2019-06-19 ENCOUNTER — Emergency Department (HOSPITAL_COMMUNITY)
Admission: EM | Admit: 2019-06-19 | Discharge: 2019-06-20 | Disposition: A | Discharge status: Home / Self Care | Payer: Medicare Other | Attending: Emergency Medicine | Admitting: Emergency Medicine

## 2019-06-19 DIAGNOSIS — I10 Essential (primary) hypertension: Secondary | ICD-10-CM | POA: Insufficient documentation

## 2019-06-19 DIAGNOSIS — R457 State of emotional shock and stress, unspecified: Secondary | ICD-10-CM | POA: Diagnosis not present

## 2019-06-19 DIAGNOSIS — R072 Precordial pain: Secondary | ICD-10-CM | POA: Diagnosis not present

## 2019-06-19 DIAGNOSIS — R079 Chest pain, unspecified: Secondary | ICD-10-CM | POA: Diagnosis not present

## 2019-06-19 DIAGNOSIS — I25810 Atherosclerosis of coronary artery bypass graft(s) without angina pectoris: Secondary | ICD-10-CM | POA: Insufficient documentation

## 2019-06-19 DIAGNOSIS — Z79899 Other long term (current) drug therapy: Secondary | ICD-10-CM | POA: Diagnosis not present

## 2019-06-19 LAB — BASIC METABOLIC PANEL
Anion gap: 10 (ref 5–15)
BUN: 22 mg/dL (ref 8–23)
CO2: 23 mmol/L (ref 22–32)
Calcium: 9.2 mg/dL (ref 8.9–10.3)
Chloride: 100 mmol/L (ref 98–111)
Creatinine, Ser: 1.42 mg/dL — ABNORMAL HIGH (ref 0.61–1.24)
GFR calc Af Amer: 57 mL/min — ABNORMAL LOW (ref >60)
GFR calc non Af Amer: 49 mL/min — ABNORMAL LOW (ref >60)
Glucose, Bld: 106 mg/dL — ABNORMAL HIGH (ref 70–99)
Potassium: 3.8 mmol/L (ref 3.5–5.1)
Sodium: 133 mmol/L — ABNORMAL LOW (ref 135–145)

## 2019-06-19 LAB — TROPONIN I (HIGH SENSITIVITY): Troponin I (High Sensitivity): 5 ng/L (ref <18)

## 2019-06-19 LAB — ETHANOL: Alcohol, Ethyl (B): <10 mg/dL (ref <10)

## 2019-06-19 MED ORDER — SODIUM CHLORIDE 0.9% FLUSH: 3.0000 mL | Freq: Once | INTRAVENOUS | Status: DC

## 2019-06-19 NOTE — ED Triage Notes (Signed)
Per EMS pt was a daily drinker and quit drinking x4 days ago

## 2019-06-19 NOTE — ED Triage Notes (Signed)
Pt in c/o L sided CP onset while laying down with x2 episodes of CP, denies n/v/d, denies SOB, refused ASA pta, pt hx of CABG, pt denies current pain, pt A&O x4

## 2019-06-20 LAB — CBC
HCT: 32.8 % — ABNORMAL LOW (ref 39.0–52.0)
Hemoglobin: 10.7 g/dL — ABNORMAL LOW (ref 13.0–17.0)
MCH: 30.6 pg (ref 26.0–34.0)
MCHC: 32.6 g/dL (ref 30.0–36.0)
MCV: 93.7 fL (ref 80.0–100.0)
Platelets: 111 10*3/uL — ABNORMAL LOW (ref 150–400)
RBC: 3.5 MIL/uL — ABNORMAL LOW (ref 4.22–5.81)
RDW: 14.2 % (ref 11.5–15.5)
WBC: 6 10*3/uL (ref 4.0–10.5)
nRBC: 0 % (ref 0.0–0.2)

## 2019-06-20 LAB — TROPONIN I (HIGH SENSITIVITY): Troponin I (High Sensitivity): 4 ng/L (ref <18)

## 2019-06-20 MED ORDER — LORAZEPAM 1 MG PO TABS
1.0000 mg | ORAL_TABLET | Freq: Once | ORAL | Status: AC
  Administered 2019-06-20: 1 mg via ORAL
  Filled 2019-06-20: qty 1

## 2019-06-20 NOTE — Telephone Encounter (Signed)
Please send records

## 2019-06-20 NOTE — Discharge Instructions (Addendum)

## 2019-06-20 NOTE — ED Provider Notes (Signed)
[facility]GENC[facility] Provider Note   CSN: [id number] Arrival date & time: 06/19/19  2250    History   Chief Complaint Chief Complaint  Patient presents with   Chest Pain    HPI [name] is a 73 y.o. male.  HPI: A 73 year old patient with a history of hypertension and hypercholesterolemia presents for evaluation of chest pain. Initial onset of pain was approximately 3-6 hours ago. The patient's chest pain is well-localized, is sharp and is not worse with exertion. The patient complains of nausea. The patient's chest pain is middle- or left-sided, is not described as heaviness/pressure/tightness and does not radiate to the arms/jaw/neck. The patient denies diaphoresis. The patient has no history of stroke, has no history of peripheral artery disease, has not smoked in the past 90 days, denies any history of treated diabetes, has no relevant family history of coronary artery disease (first degree relative at less than age 72) and does not have an elevated BMI (>=30).   The history is provided by the patient.  Chest Pain Pain location:  L chest Pain quality: sharp   Pain radiates to:  Does not radiate Timing:  Intermittent Progression:  Resolved Chronicity:  New Relieved by:  None tried Worsened by:  Nothing Associated symptoms: shortness of breath   Associated symptoms: no cough, no fever and no vomiting    Patient reports he had 2 separate episodes of left-sided sharp chest pain prior to arrival.  Each episode lasted approximately 1 minute.  He felt short of breath and nauseous during the episodes but they have since resolved.  Denies fever/vomiting. He does have a previous history of CAD. Patient reports he quit drinking alcohol over 4 days ago.  He reports he has been under a lot of stress recently with the recent divorce.  However he has been sober for several days. Past Medical History:  Diagnosis Date   Anginal pain (HCC) 01/2005    Anxiety    Arthritis    "knees"   Chronic lower back pain    Coronary artery disease    Depression    Hepatitis    "don't know what kind; they treated me for it; was a long time ago" (12/11/2018)   High cholesterol    History of bronchitis    "used to get it alot" (08/25/2012)   Hypertension    Panic attacks    PTSD (post-traumatic stress disorder)    "have been treated in the past" (08/25/2012)    Patient Active Problem List   Diagnosis Date Noted   Alcohol abuse with alcohol-induced mood disorder (HCC) 06/16/2019   PTSD (post-traumatic stress disorder) 06/16/2019   Educated About Covid-19 Virus Infection 03/12/2019   DOE (dyspnea on exertion) 03/05/2019   Thrombocytopenia (HCC) 02/24/2019   Abnormal liver function tests 02/24/2019   Hypertensive urgency 12/11/2018   Chronic alcohol abuse 12/09/2015   Coronary artery disease involving coronary bypass graft of native heart without angina pectoris 08/07/2014   Essential hypertension 08/07/2014   Hyperlipidemia 08/07/2014    Past Surgical History:  Procedure Laterality Date   CARDIAC CATHETERIZATION  01/2005   CORONARY ARTERY BYPASS GRAFT  01/2005   CABG X3   Shrapnel Left 1969   LLE; left lateral thumb (required grafting)   VARICOSE VEIN SURGERY  1980's   LLE        Home Medications    Prior to Admission medications   Medication Sig Start Date End Date Taking? Authorizing Provider  albuterol (  PROVENTIL HFA;VENTOLIN HFA) 108 (90 Base) MCG/ACT inhaler Inhale 2 puffs into the lungs every 4 (four) hours as needed for up to 30 days for wheezing or shortness of breath. Patient not taking: Reported on 06/15/2019 03/08/19 05/24/19  Alveria Apley, PA-C  FLUoxetine (PROZAC) 20 MG capsule Take 20 mg by mouth daily.    [provider]  folic acid (FOLVITE) 1 MG tablet Take 1 tablet (1 mg total) by mouth daily. 06/16/19   Horald Pollen, MD  furosemide (LASIX) 20 MG tablet Take 1 tablet (20 mg  total) by mouth daily. 05/01/19   Belva Crome, MD  gabapentin (NEURONTIN) 100 MG capsule Take 1 capsule (100 mg total) by mouth 3 (three) times daily. 06/16/19 06/15/20  Ethelene Hal, NP  losartan (COZAAR) 50 MG tablet Take 1 tablet (50 mg total) by mouth daily. 05/24/19 08/22/19  Belva Crome, MD  metoprolol succinate (TOPROL-XL) 25 MG 24 hr tablet Take 1 tablet (25 mg total) by mouth daily. 05/29/19   Belva Crome, MD  nitroGLYCERIN (NITROSTAT) 0.4 MG SL tablet Place 1 tablet (0.4 mg total) under the tongue every 5 (five) minutes as needed for chest pain. Patient not taking: Reported on 06/15/2019 01/24/19   Horald Pollen, MD  pantoprazole (PROTONIX) 40 MG tablet Take 1 tablet (40 mg total) by mouth 2 (two) times daily. 06/16/19   Horald Pollen, MD    Family History Family History  Problem Relation Age of Onset   Diabetes Mother    Heart disease Father    Cancer Brother     Social History Social History   Tobacco Use   Smoking status: Former Smoker    Packs/day: 2.00    Years: 5.00    Pack years: 10.00   Smokeless tobacco: Never Used   Tobacco comment: 08/25/2012 "quit smoking cigarettes 40 yr ago"  Substance Use Topics   Alcohol use: Yes    Alcohol/week: 60.0 standard drinks    Types: 60 Standard drinks or equivalent per week    Comment: 12/11/2018 "~ 1 pint of vodka/day"   Drug use: Yes    Types: Marijuana    Comment: 08/25/2012 "last marijuana 10 years ago"     Allergies   Codeine and Vicodin [hydrocodone-acetaminophen]   Review of Systems Review of Systems  Constitutional: Negative for fever.  Respiratory: Positive for shortness of breath. Negative for cough.   Cardiovascular: Positive for chest pain.  Gastrointestinal: Negative for vomiting.  All other systems reviewed and are negative.    Physical Exam Updated Vital Signs BP (!) 177/78    Pulse (!) 57    Temp 98.5 F (36.9 C) (Oral)    Resp 18    SpO2 98%   Physical  Exam CONSTITUTIONAL: Well developed/well nourished HEAD: Normocephalic/atraumatic EYES: EOMI/PERRL ENMT: Mucous membranes moist NECK: supple no meningeal signs SPINE/BACK:entire spine nontender CV: S1/S2 noted, no murmurs/rubs/gallops noted LUNGS: Lungs are clear to auscultation bilaterally, no apparent distress ABDOMEN: soft, nontender, no rebound or guarding, bowel sounds noted throughout abdomen GU:no cva tenderness NEURO: Pt is awake/alert/appropriate, moves all extremitiesx4.  No facial droop.   EXTREMITIES: pulses normal/equal, full ROM, no lower extremity edema or tenderness SKIN: warm, color normal PSYCH: Mildly anxious   ED Treatments / Results  Labs (all labs ordered are listed, but only abnormal results are displayed) Labs Reviewed  BASIC METABOLIC PANEL - Abnormal; Notable for the following components:      Result Value   Sodium 133 (*)  Glucose, Bld 106 (*)    Creatinine, Ser 1.42 (*)    GFR calc non Af Amer 49 (*)    GFR calc Af Amer 57 (*)    All other components within normal limits  CBC - Abnormal; Notable for the following components:   RBC 3.50 (*)    Hemoglobin 10.7 (*)    HCT 32.8 (*)    Platelets 111 (*)    All other components within normal limits  ETHANOL  TROPONIN I (HIGH SENSITIVITY)  TROPONIN I (HIGH SENSITIVITY)    EKG EKG Interpretation  Date/Time:  Tuesday June 19 2019 22:52:17 EDT Ventricular Rate:  56 PR Interval:  220 QRS Duration: 88 QT Interval:  434 QTC Calculation: 418 R Axis:   57 Text Interpretation:  Sinus bradycardia with 1st degree A-V block T wave abnormality, consider lateral ischemia Abnormal ECG When compared with ECG of 06/15/2019, QT has shortened Confirmed by Dione BoozeGlick, David ([id number]) on 06/20/2019 12:02:48 AM   Radiology Dg Chest 2 View  Result Date: 06/19/2019 CLINICAL DATA:  Left-sided chest pain for or 1 day, initial encounter EXAM: CHEST - 2 VIEW COMPARISON:  03/08/2019 FINDINGS: Cardiac shadow is stable.  Postsurgical changes are again seen. Aortic calcifications are noted. The lungs are clear. No bony abnormality is noted. IMPRESSION: No active cardiopulmonary disease. Electronically Signed   By: Alcide CleverMark  Lukens M.D.   On: 06/19/2019 23:26    Procedures Procedures (including critical care time)  Medications Ordered in ED Medications  sodium chloride flush (NS) 0.9 % injection 3 mL (has no administration in time range)  LORazepam (ATIVAN) tablet 1 mg (1 mg Oral Given 06/20/19 0400)     Initial Impression / Assessment and Plan / ED Course  I have reviewed the triage vital signs and the nursing notes.  Pertinent labs & imaging results that were available during my care of the patient were reviewed by me and considered in my medical decision making (see chart for details).     HEAR Score: 5  Patient had 2 isolated episodes of left-sided sharp chest pain that have since resolved several hours ago.  He is well-appearing in no acute distress.  No acute troponin change.  No hypoxia or tachycardia to suggest PE.  Have low suspicion for ACS/dissection at this time.  EKG abnormal, but is similar to prior EKGs. No acute ST changes. Patient was concerned about his elevated blood pressure, but reports he did miss his dose of losartan. He reports feeling mildly anxious and is requesting a one-time dose of Ativan. Patient is otherwise stable and appropriate for discharge home.  He will monitor his blood pressure at home if it continues remain elevated despite his home medicines, he will follow-up with his cardiologist Dr. Katrinka BlazingSmith. Of note, he was recently seen by psychiatry and was deemed appropriate for outpatient management.  I agree with this assessment  Final Clinical Impressions(s) / ED Diagnoses   Final diagnoses:  Precordial pain    ED Discharge Orders    None       Zadie RhineWickline, Micco Bourbeau, MD 06/20/19 [phone]

## 2019-06-21 NOTE — Telephone Encounter (Signed)
I called pt and left a vm for him that I need a verbal auth and I can release his medical records.

## 2019-06-21 NOTE — Telephone Encounter (Signed)
I called pt and he wants his [facility] ED medical records. I informed him I can not release these records and let him know he should call [facility] MR dept.

## 2019-07-03 ENCOUNTER — Encounter: Payer: Self-pay | Admitting: Emergency Medicine

## 2019-07-03 ENCOUNTER — Other Ambulatory Visit: Payer: Self-pay

## 2019-07-03 ENCOUNTER — Ambulatory Visit (INDEPENDENT_AMBULATORY_CARE_PROVIDER_SITE_OTHER): Payer: Medicare Other | Admitting: Emergency Medicine

## 2019-07-03 VITALS — BP 149/71 | HR 58 | Temp 98.9°F | Resp 16 | Wt 226.2 lb

## 2019-07-03 DIAGNOSIS — E7849 Other hyperlipidemia: Secondary | ICD-10-CM | POA: Diagnosis not present

## 2019-07-03 DIAGNOSIS — I110 Hypertensive heart disease with heart failure: Secondary | ICD-10-CM | POA: Diagnosis not present

## 2019-07-03 DIAGNOSIS — F1010 Alcohol abuse, uncomplicated: Secondary | ICD-10-CM

## 2019-07-03 DIAGNOSIS — Z8679 Personal history of other diseases of the circulatory system: Secondary | ICD-10-CM

## 2019-07-03 DIAGNOSIS — I5032 Chronic diastolic (congestive) heart failure: Secondary | ICD-10-CM | POA: Diagnosis not present

## 2019-07-03 DIAGNOSIS — F4310 Post-traumatic stress disorder, unspecified: Secondary | ICD-10-CM

## 2019-07-03 DIAGNOSIS — I25810 Atherosclerosis of coronary artery bypass graft(s) without angina pectoris: Secondary | ICD-10-CM | POA: Diagnosis not present

## 2019-07-03 NOTE — Progress Notes (Signed)
[name] 73 y.o.   Chief Complaint  Patient presents with   Chest Pain    patient was seen in ED 06/19/2019, per patient he was referred to Cardiologist    HISTORY OF PRESENT ILLNESS: This is a 73 y.o. male with history of hypertensive heart disease and chronic congestive heart failure, CAD status post CABG in 2006, here for follow-up.  Compliant with medications.  States blood pressure is doing well. Also has a history of PTSD, chronic depression, and alcoholism.  Presently seeing psychiatrist with good results.  On medication. Today he has no complaints or medical concerns.  Doing well.  HPI   Prior to Admission medications   Medication Sig Start Date End Date Taking? Authorizing Provider  FLUoxetine (PROZAC) 20 MG capsule Take 20 mg by mouth daily.   Yes [provider]  folic acid (FOLVITE) 1 MG tablet Take 1 tablet (1 mg total) by mouth daily. 06/16/19  Yes Ankush Gintz, Eilleen KempfMiguel Jose, MD  furosemide (LASIX) 20 MG tablet Take 1 tablet (20 mg total) by mouth daily. 05/01/19  Yes Lyn RecordsSmith, Henry W, MD  losartan (COZAAR) 50 MG tablet Take 1 tablet (50 mg total) by mouth daily. 05/24/19 08/22/19 Yes Lyn RecordsSmith, Henry W, MD  metoprolol succinate (TOPROL-XL) 25 MG 24 hr tablet Take 1 tablet (25 mg total) by mouth daily. 05/29/19  Yes Lyn RecordsSmith, Henry W, MD  nitroGLYCERIN (NITROSTAT) 0.4 MG SL tablet Place 1 tablet (0.4 mg total) under the tongue every 5 (five) minutes as needed for chest pain. 01/24/19  Yes Deliliah Spranger, Eilleen KempfMiguel Jose, MD  pantoprazole (PROTONIX) 40 MG tablet Take 1 tablet (40 mg total) by mouth 2 (two) times daily. 06/16/19  Yes Elexius Minar, Eilleen KempfMiguel Jose, MD  albuterol (PROVENTIL HFA;VENTOLIN HFA) 108 (90 Base) MCG/ACT inhaler Inhale 2 puffs into the lungs every 4 (four) hours as needed for up to 30 days for wheezing or shortness of breath. Patient not taking: Reported on 06/15/2019 03/08/19 05/24/19  Ronnie DossMcLean, Kelly A, PA-C  gabapentin (NEURONTIN) 100 MG capsule Take 1 capsule (100 mg total) by  mouth 3 (three) times daily. Patient not taking: Reported on 07/03/2019 06/16/19 06/15/20  Laveda AbbeParks, Laurie Britton, NP    Allergies  Allergen Reactions   Codeine Palpitations   Vicodin [Hydrocodone-Acetaminophen] Nausea And Vomiting, Swelling and Palpitations    Patient Active Problem List   Diagnosis Date Noted   Alcohol abuse with alcohol-induced mood disorder (HCC) 06/16/2019   PTSD (post-traumatic stress disorder) 06/16/2019   Thrombocytopenia (HCC) 02/24/2019   Chronic alcohol abuse 12/09/2015   Coronary artery disease involving coronary bypass graft of native heart without angina pectoris 08/07/2014   Essential hypertension 08/07/2014   Hyperlipidemia 08/07/2014    Past Medical History:  Diagnosis Date   Anginal pain (HCC) 01/2005   Anxiety    Arthritis    "knees"   Chronic lower back pain    Coronary artery disease    Depression    Hepatitis    "don't know what kind; they treated me for it; was a long time ago" (12/11/2018)   High cholesterol    History of bronchitis    "used to get it alot" (08/25/2012)   Hypertension    Panic attacks    PTSD (post-traumatic stress disorder)    "have been treated in the past" (08/25/2012)    Past Surgical History:  Procedure Laterality Date   CARDIAC CATHETERIZATION  01/2005   CORONARY ARTERY BYPASS GRAFT  01/2005   CABG X3   Shrapnel Left 1969  LLE; left lateral thumb (required grafting)   VARICOSE VEIN SURGERY  1980's   LLE    Social History   Socioeconomic History   Marital status: Divorced    Spouse name: Not on file   Number of children: Not on file   Years of education: Not on file   Highest education level: Not on file  Occupational History   Not on file  Social Needs   Financial resource strain: Not on file   Food insecurity    Worry: Not on file    Inability: Not on file   Transportation needs    Medical: Not on file    Non-medical: Not on file  Tobacco Use   Smoking  status: Former Smoker    Packs/day: 2.00    Years: 5.00    Pack years: 10.00   Smokeless tobacco: Never Used   Tobacco comment: 08/25/2012 "quit smoking cigarettes 40 yr ago"  Substance and Sexual Activity   Alcohol use: Yes    Alcohol/week: 60.0 standard drinks    Types: 60 Standard drinks or equivalent per week    Comment: 12/11/2018 "~ 1 pint of vodka/day"   Drug use: Yes    Types: Marijuana    Comment: 08/25/2012 "last marijuana 10 years ago"   Sexual activity: Yes  Lifestyle   Physical activity    Days per week: Not on file    Minutes per session: Not on file   Stress: Not on file  Relationships   Social connections    Talks on phone: Not on file    Gets together: Not on file    Attends religious service: Not on file    Active member of club or organization: Not on file    Attends meetings of clubs or organizations: Not on file    Relationship status: Not on file   Intimate partner violence    Fear of current or ex partner: Not on file    Emotionally abused: Not on file    Physically abused: Not on file    Forced sexual activity: Not on file  Other Topics Concern   Not on file  Social History Narrative   Not on file    Family History  Problem Relation Age of Onset   Diabetes Mother    Heart disease Father    Cancer Brother      Review of Systems  Constitutional: Negative.  Negative for chills and fever.  HENT: Negative.  Negative for congestion and sore throat.   Eyes: Negative.   Respiratory: Negative.  Negative for cough and shortness of breath.   Cardiovascular: Negative.  Negative for chest pain and palpitations.  Gastrointestinal: Negative for abdominal pain, diarrhea, nausea and vomiting.  Genitourinary: Negative.   Musculoskeletal: Negative.  Negative for myalgias.  Skin: Negative.  Negative for rash.  Neurological: Negative.  Negative for dizziness and headaches.  All other systems reviewed and are negative.  Vitals:   07/03/19 1355   BP: (!) 149/71  Pulse: (!) 58  Resp: 16  Temp: 98.9 F (37.2 C)  SpO2: 96%     Physical Exam Vitals signs reviewed.  Constitutional:      Appearance: He is well-developed.  HENT:     Head: Normocephalic and atraumatic.  Eyes:     Extraocular Movements: Extraocular movements intact.     Pupils: Pupils are equal, round, and reactive to light.  Neck:     Musculoskeletal: Normal range of motion.  Cardiovascular:  Rate and Rhythm: Normal rate and regular rhythm.     Heart sounds: Normal heart sounds.  Pulmonary:     Effort: Pulmonary effort is normal.     Breath sounds: Normal breath sounds.  Musculoskeletal: Normal range of motion.  Skin:    General: Skin is warm and dry.     Capillary Refill: Capillary refill takes less than 2 seconds.  Neurological:     General: No focal deficit present.     Mental Status: He is alert and oriented to person, place, and time.  Psychiatric:        Mood and Affect: Mood normal.        Behavior: Behavior normal.    A total of 25 minutes was spent in the room with the patient, greater than 50% of which was in counseling/coordination of care regarding chronic medical problems, management, medication side effects, importance of nutrition and regular physical activity, prognosis, and need for follow-up.  Recent blood work reviewed with patient.  Liver enzymes much improved.   ASSESSMENT & PLAN: [place] was seen today for chest pain.  Diagnoses and all orders for this visit:  Hypertensive heart disease with chronic diastolic congestive heart failure ([place])  History of coronary artery disease  Coronary artery disease involving coronary bypass graft of native heart without angina pectoris  Other hyperlipidemia  Chronic alcohol abuse  PTSD (post-traumatic stress disorder)   Clinically stable.  Well compensated chronic medical problems.  Compliant with medications. Continue present medications.  No changes. No medical concerns  identified during this visit. Follow-up with cardiologist as scheduled. Follow-up here in 6 months.   Patient Instructions       If you have lab work done today you will be contacted with your lab results within the next 2 weeks.  If you have not heard from [place] then please contact us. The fastest way to get your results is to register for My Chart.   IF you received an x-ray today, you will receive an invoice from [facility]. Please contact [facility] at [phone] with questions or concerns regarding your invoice.   IF you received labwork today, you will receive an invoice from [facility]. Please contact [facility] at [phone] with questions or concerns regarding your invoice.   Our billing staff will not be able to assist you with questions regarding bills from these companies.  You will be contacted with the lab results as soon as they are available. The fastest way to get your results is to activate your My Chart account. Instructions are located on the last page of this paperwork. If you have not heard from [place] regarding the results in 2 weeks, please contact this office.     Hypertension, Adult High blood pressure (hypertension) is when the force of blood pumping through the arteries is too strong. The arteries are the blood vessels that carry blood from the heart throughout the body. Hypertension forces the heart to work harder to pump blood and may cause arteries to become narrow or stiff. Untreated or uncontrolled hypertension can cause a heart attack, heart failure, a stroke, kidney disease, and other problems. A blood pressure reading consists of a higher number over a lower number. Ideally, your blood pressure should be below 120/80. The first ("top") number is called the systolic pressure. It is a measure of the pressure in your arteries as your heart beats. The second ("bottom") number is called the diastolic pressure. It is a measure of the pressure in  your  arteries as the heart relaxes. What are the causes? The exact cause of this condition is not known. There are some conditions that result in or are related to high blood pressure. What increases the risk? Some risk factors for high blood pressure are under your control. The following factors may make you more likely to develop this condition:  Smoking.  Having type 2 diabetes mellitus, high cholesterol, or both.  Not getting enough exercise or physical activity.  Being overweight.  Having too much fat, sugar, calories, or salt (sodium) in your diet.  Drinking too much alcohol. Some risk factors for high blood pressure may be difficult or impossible to change. Some of these factors include:  Having chronic kidney disease.  Having a family history of high blood pressure.  Age. Risk increases with age.  Race. You may be at higher risk if you are African American.  Gender. Men are at higher risk than women before age 72. After age 72, women are at higher risk than men.  Having obstructive sleep apnea.  Stress. What are the signs or symptoms? High blood pressure may not cause symptoms. Very high blood pressure (hypertensive crisis) may cause:  Headache.  Anxiety.  Shortness of breath.  Nosebleed.  Nausea and vomiting.  Vision changes.  Severe chest pain.  Seizures. How is this diagnosed? This condition is diagnosed by measuring your blood pressure while you are seated, with your arm resting on a flat surface, your legs uncrossed, and your feet flat on the floor. The cuff of the blood pressure monitor will be placed directly against the skin of your upper arm at the level of your heart. It should be measured at least twice using the same arm. Certain conditions can cause a difference in blood pressure between your right and left arms. Certain factors can cause blood pressure readings to be lower or higher than normal for a short period of time:  When your blood  pressure is higher when you are in a health care provider's office than when you are at home, this is called white coat hypertension. Most people with this condition do not need medicines.  When your blood pressure is higher at home than when you are in a health care provider's office, this is called masked hypertension. Most people with this condition may need medicines to control blood pressure. If you have a high blood pressure reading during one visit or you have normal blood pressure with other risk factors, you may be asked to:  Return on a different day to have your blood pressure checked again.  Monitor your blood pressure at home for 1 week or longer. If you are diagnosed with hypertension, you may have other blood or imaging tests to help your health care provider understand your overall risk for other conditions. How is this treated? This condition is treated by making healthy lifestyle changes, such as eating healthy foods, exercising more, and reducing your alcohol intake. Your health care provider may prescribe medicine if lifestyle changes are not enough to get your blood pressure under control, and if:  Your systolic blood pressure is above 130.  Your diastolic blood pressure is above 80. Your personal target blood pressure may vary depending on your medical conditions, your age, and other factors. Follow these instructions at home: Eating and drinking   Eat a diet that is high in fiber and potassium, and low in sodium, added sugar, and fat. An example eating plan is called the DASH (Dietary Approaches  to Stop Hypertension) diet. To eat this way: ? Eat plenty of fresh fruits and vegetables. Try to fill one half of your plate at each meal with fruits and vegetables. ? Eat whole grains, such as whole-wheat pasta, brown rice, or whole-grain bread. Fill about one fourth of your plate with whole grains. ? Eat or drink low-fat dairy products, such as skim milk or low-fat  yogurt. ? Avoid fatty cuts of meat, processed or cured meats, and poultry with skin. Fill about one fourth of your plate with lean proteins, such as fish, chicken without skin, beans, eggs, or tofu. ? Avoid pre-made and processed foods. These tend to be higher in sodium, added sugar, and fat.  Reduce your daily sodium intake. Most people with hypertension should eat less than 1,500 mg of sodium a day.  Do not drink alcohol if: ? Your health care provider tells you not to drink. ? You are pregnant, may be pregnant, or are planning to become pregnant.  If you drink alcohol: ? Limit how much you use to:  0-1 drink a day for women.  0-2 drinks a day for men. ? Be aware of how much alcohol is in your drink. In the [place] one drink equals one 12 oz bottle of beer (355 mL), one 5 oz glass of wine (148 mL), or one 1 oz glass of hard liquor (44 mL). Lifestyle   Work with your health care provider to maintain a healthy body weight or to lose weight. Ask what an ideal weight is for you.  Get at least 30 minutes of exercise most days of the week. Activities may include walking, swimming, or biking.  Include exercise to strengthen your muscles (resistance exercise), such as Pilates or lifting weights, as part of your weekly exercise routine. Try to do these types of exercises for 30 minutes at least 3 days a week.  Do not use any products that contain nicotine or tobacco, such as cigarettes, e-cigarettes, and chewing tobacco. If you need help quitting, ask your health care provider.  Monitor your blood pressure at home as told by your health care provider.  Keep all follow-up visits as told by your health care provider. This is important. Medicines  Take over-the-counter and prescription medicines only as told by your health care provider. Follow directions carefully. Blood pressure medicines must be taken as prescribed.  Do not skip doses of blood pressure medicine. Doing this puts you at risk  for problems and can make the medicine less effective.  Ask your health care provider about side effects or reactions to medicines that you should watch for. Contact a health care provider if you:  Think you are having a reaction to a medicine you are taking.  Have headaches that keep coming back (recurring).  Feel dizzy.  Have swelling in your ankles.  Have trouble with your vision. Get help right away if you:  Develop a severe headache or confusion.  Have unusual weakness or numbness.  Feel faint.  Have severe pain in your chest or abdomen.  Vomit repeatedly.  Have trouble breathing. Summary  Hypertension is when the force of blood pumping through your arteries is too strong. If this condition is not controlled, it may put you at risk for serious complications.  Your personal target blood pressure may vary depending on your medical conditions, your age, and other factors. For most people, a normal blood pressure is less than 120/80.  Hypertension is treated with lifestyle changes, medicines, or a  combination of both. Lifestyle changes include losing weight, eating a healthy, low-sodium diet, exercising more, and limiting alcohol. This information is not intended to replace advice given to you by your health care provider. Make sure you discuss any questions you have with your health care provider. Document Released: 11/15/2005 Document Revised: 07/26/2018 Document Reviewed: 07/26/2018 Elsevier Patient Education  2020 [employer]      Edwina Barth, MD [facility]

## 2019-07-03 NOTE — Patient Instructions (Addendum)
   If you have lab work done today you will be contacted with your lab results within the next 2 weeks.  If you have not heard from us then please contact us. The fastest way to get your results is to register for My Chart.   IF you received an x-ray today, you will receive an invoice from [facility]. Please contact [facility] at [phone] with questions or concerns regarding your invoice.   IF you received labwork today, you will receive an invoice from [facility]. Please contact [facility] at [phone] with questions or concerns regarding your invoice.   Our billing staff will not be able to assist you with questions regarding bills from these companies.  You will be contacted with the lab results as soon as they are available. The fastest way to get your results is to activate your My Chart account. Instructions are located on the last page of this paperwork. If you have not heard from us regarding the results in 2 weeks, please contact this office.     Hypertension, Adult High blood pressure (hypertension) is when the force of blood pumping through the arteries is too strong. The arteries are the blood vessels that carry blood from the heart throughout the body. Hypertension forces the heart to work harder to pump blood and may cause arteries to become narrow or stiff. Untreated or uncontrolled hypertension can cause a heart attack, heart failure, a stroke, kidney disease, and other problems. A blood pressure reading consists of a higher number over a lower number. Ideally, your blood pressure should be below 120/80. The first ("top") number is called the systolic pressure. It is a measure of the pressure in your arteries as your heart beats. The second ("bottom") number is called the diastolic pressure. It is a measure of the pressure in your arteries as the heart relaxes. What are the causes? The exact cause of this condition is not known. There are some conditions  that result in or are related to high blood pressure. What increases the risk? Some risk factors for high blood pressure are under your control. The following factors may make you more likely to develop this condition:  Smoking.  Having type 2 diabetes mellitus, high cholesterol, or both.  Not getting enough exercise or physical activity.  Being overweight.  Having too much fat, sugar, calories, or salt (sodium) in your diet.  Drinking too much alcohol. Some risk factors for high blood pressure may be difficult or impossible to change. Some of these factors include:  Having chronic kidney disease.  Having a family history of high blood pressure.  Age. Risk increases with age.  Race. You may be at higher risk if you are African American.  Gender. Men are at higher risk than women before age 45. After age 65, women are at higher risk than men.  Having obstructive sleep apnea.  Stress. What are the signs or symptoms? High blood pressure may not cause symptoms. Very high blood pressure (hypertensive crisis) may cause:  Headache.  Anxiety.  Shortness of breath.  Nosebleed.  Nausea and vomiting.  Vision changes.  Severe chest pain.  Seizures. How is this diagnosed? This condition is diagnosed by measuring your blood pressure while you are seated, with your arm resting on a flat surface, your legs uncrossed, and your feet flat on the floor. The cuff of the blood pressure monitor will be placed directly against the skin of your upper arm at the level of your heart.   It should be measured at least twice using the same arm. Certain conditions can cause a difference in blood pressure between your right and left arms. Certain factors can cause blood pressure readings to be lower or higher than normal for a short period of time:  When your blood pressure is higher when you are in a health care provider's office than when you are at home, this is called white coat hypertension.  Most people with this condition do not need medicines.  When your blood pressure is higher at home than when you are in a health care provider's office, this is called masked hypertension. Most people with this condition may need medicines to control blood pressure. If you have a high blood pressure reading during one visit or you have normal blood pressure with other risk factors, you may be asked to:  Return on a different day to have your blood pressure checked again.  Monitor your blood pressure at home for 1 week or longer. If you are diagnosed with hypertension, you may have other blood or imaging tests to help your health care provider understand your overall risk for other conditions. How is this treated? This condition is treated by making healthy lifestyle changes, such as eating healthy foods, exercising more, and reducing your alcohol intake. Your health care provider may prescribe medicine if lifestyle changes are not enough to get your blood pressure under control, and if:  Your systolic blood pressure is above 130.  Your diastolic blood pressure is above 80. Your personal target blood pressure may vary depending on your medical conditions, your age, and other factors. Follow these instructions at home: Eating and drinking   Eat a diet that is high in fiber and potassium, and low in sodium, added sugar, and fat. An example eating plan is called the DASH (Dietary Approaches to Stop Hypertension) diet. To eat this way: ? Eat plenty of fresh fruits and vegetables. Try to fill one half of your plate at each meal with fruits and vegetables. ? Eat whole grains, such as whole-wheat pasta, brown rice, or whole-grain bread. Fill about one fourth of your plate with whole grains. ? Eat or drink low-fat dairy products, such as skim milk or low-fat yogurt. ? Avoid fatty cuts of meat, processed or cured meats, and poultry with skin. Fill about one fourth of your plate with lean proteins, such  as fish, chicken without skin, beans, eggs, or tofu. ? Avoid pre-made and processed foods. These tend to be higher in sodium, added sugar, and fat.  Reduce your daily sodium intake. Most people with hypertension should eat less than 1,500 mg of sodium a day.  Do not drink alcohol if: ? Your health care provider tells you not to drink. ? You are pregnant, may be pregnant, or are planning to become pregnant.  If you drink alcohol: ? Limit how much you use to:  0-1 drink a day for women.  0-2 drinks a day for men. ? Be aware of how much alcohol is in your drink. In the [place] one drink equals one 12 oz bottle of beer (355 mL), one 5 oz glass of wine (148 mL), or one 1 oz glass of hard liquor (44 mL). Lifestyle   Work with your health care provider to maintain a healthy body weight or to lose weight. Ask what an ideal weight is for you.  Get at least 30 minutes of exercise most days of the week. Activities may include walking, swimming, or   biking.  Include exercise to strengthen your muscles (resistance exercise), such as Pilates or lifting weights, as part of your weekly exercise routine. Try to do these types of exercises for 30 minutes at least 3 days a week.  Do not use any products that contain nicotine or tobacco, such as cigarettes, e-cigarettes, and chewing tobacco. If you need help quitting, ask your health care provider.  Monitor your blood pressure at home as told by your health care provider.  Keep all follow-up visits as told by your health care provider. This is important. Medicines  Take over-the-counter and prescription medicines only as told by your health care provider. Follow directions carefully. Blood pressure medicines must be taken as prescribed.  Do not skip doses of blood pressure medicine. Doing this puts you at risk for problems and can make the medicine less effective.  Ask your health care provider about side effects or reactions to medicines that you  should watch for. Contact a health care provider if you:  Think you are having a reaction to a medicine you are taking.  Have headaches that keep coming back (recurring).  Feel dizzy.  Have swelling in your ankles.  Have trouble with your vision. Get help right away if you:  Develop a severe headache or confusion.  Have unusual weakness or numbness.  Feel faint.  Have severe pain in your chest or abdomen.  Vomit repeatedly.  Have trouble breathing. Summary  Hypertension is when the force of blood pumping through your arteries is too strong. If this condition is not controlled, it may put you at risk for serious complications.  Your personal target blood pressure may vary depending on your medical conditions, your age, and other factors. For most people, a normal blood pressure is less than 120/80.  Hypertension is treated with lifestyle changes, medicines, or a combination of both. Lifestyle changes include losing weight, eating a healthy, low-sodium diet, exercising more, and limiting alcohol. This information is not intended to replace advice given to you by your health care provider. Make sure you discuss any questions you have with your health care provider. Document Released: 11/15/2005 Document Revised: 07/26/2018 Document Reviewed: 07/26/2018 Elsevier Patient Education  2020 Elsevier Inc.  

## 2019-07-16 ENCOUNTER — Encounter: Payer: Self-pay | Admitting: Interventional Cardiology

## 2019-07-16 ENCOUNTER — Telehealth: Payer: Self-pay | Admitting: Interventional Cardiology

## 2019-07-16 ENCOUNTER — Telehealth: Payer: Self-pay | Admitting: Emergency Medicine

## 2019-07-16 NOTE — Telephone Encounter (Signed)
He would like to come by and pick up when ready.

## 2019-07-16 NOTE — Telephone Encounter (Signed)
  Mr [name] would like to speak to the nurse about getting a note for work since he has been out due to Covid and his CHF.

## 2019-07-16 NOTE — Telephone Encounter (Signed)
error 

## 2019-07-16 NOTE — Telephone Encounter (Signed)
Spoke with pt and he states that he needs a note for his job because he has been out of work since March 20th.  They need something stating he needs to be out until it is safe for him to return to work and they need it back dated to mention since March.  Pt is a [employer] and is around multiple people a day when working.  Advised I will send message to Dr. Tamala Julian for review.

## 2019-07-16 NOTE — Telephone Encounter (Signed)
Okay to provide work note.  Thanks.

## 2019-07-16 NOTE — Telephone Encounter (Signed)
Pt would like a letter very similar to the letter you wrote on 03/21/19 stating he can't work due to his heart condition. Pt would like this letter sent to. Please advise at  [phone]

## 2019-07-16 NOTE — Telephone Encounter (Signed)
Pt is asking for another letter to be released from work due to heart condition

## 2019-07-16 NOTE — Telephone Encounter (Signed)
Follow up    Patient is calling in reference to letter he requested for him to be out of work. He would like for it be emailed to [email or web address]. Please call patient.

## 2019-07-17 NOTE — Telephone Encounter (Signed)
No message needed °

## 2019-07-17 NOTE — Telephone Encounter (Signed)
Letter is at the front ready for pick up

## 2019-07-17 NOTE — Telephone Encounter (Signed)
Follow up    Patient states that he no longer needs the letter per the previous message. He states that this has been taken care of. Please call if you have any questions.

## 2019-07-17 NOTE — Telephone Encounter (Signed)
Below message was an error

## 2019-10-04 ENCOUNTER — Telehealth: Payer: Self-pay | Admitting: *Deleted

## 2019-10-04 NOTE — Telephone Encounter (Signed)
Schedule AWV.  

## 2019-10-05 ENCOUNTER — Encounter: Payer: Self-pay | Admitting: Emergency Medicine

## 2019-10-09 ENCOUNTER — Ambulatory Visit (INDEPENDENT_AMBULATORY_CARE_PROVIDER_SITE_OTHER): Payer: Medicare Other | Admitting: Emergency Medicine

## 2019-10-09 VITALS — BP 149/71 | Ht 71.0 in | Wt 226.0 lb

## 2019-10-09 DIAGNOSIS — Z0000 Encounter for general adult medical examination without abnormal findings: Secondary | ICD-10-CM

## 2019-10-09 NOTE — Progress Notes (Signed)
Presents today for [employer] Visit   Date of last exam: 07/03/2019  Interpreter used for this visit?  No  I connected with  [name] on 10/09/19 by a  telephone and verified that I am speaking with the correct person using two identifiers.   I discussed the limitations of evaluation and management by telemedicine. The patient expressed understanding and agreed to proceed.   Patient Care Team: Horald Pollen, MD as PCP - General (Internal Medicine) Belva Crome, MD as PCP - Cardiology (Cardiology)   Other items to address today:   Discussed immunization Declined FLU-SHOT Discussed Eye/Dental    Other Screening:  Last lipid screening: 05/24/2019  ADVANCE DIRECTIVES: Discussed: yes On File: no Materials Provided: no Declined  Immunization status:  Immunization History  Administered Date(s) Administered  . Tdap 02/11/2013     There are no preventive care reminders to display for this patient.   Functional Status Survey: Is the patient deaf or have difficulty hearing?: No Does the patient have difficulty seeing, even when wearing glasses/contacts?: No Does the patient have difficulty concentrating, remembering, or making decisions?: No Does the patient have difficulty walking or climbing stairs?: No Does the patient have difficulty dressing or bathing?: No Does the patient have difficulty doing errands alone such as visiting a doctor's office or shopping?: No   6CIT Screen 10/09/2019  What Year? 0 points  What month? 0 points  What time? 0 points  Count back from 20 0 points  Months in reverse 0 points  Repeat phrase 0 points  Total Score 0        Clinical Support from 10/09/2019 in [place] at [place]  AUDIT-C Score  2       Home Environment:   Lives in a one story No trouble climbing stairs No scattered rugs Yes grab bars Adequate lighting/no clutter   Patient Active Problem List   Diagnosis Date  Noted  . Alcohol abuse with alcohol-induced mood disorder ([place]) 06/16/2019  . PTSD (post-traumatic stress disorder) 06/16/2019  . Thrombocytopenia ([place]) 02/24/2019  . Chronic alcohol abuse 12/09/2015  . Coronary artery disease involving coronary bypass graft of native heart without angina pectoris 08/07/2014  . Essential hypertension 08/07/2014  . Hyperlipidemia 08/07/2014     Past Medical History:  Diagnosis Date  . Anginal pain ([place]) 01/2005  . Anxiety   . Arthritis    "knees"  . Chronic lower back pain   . Coronary artery disease   . Depression   . Hepatitis    "don't know what kind; they treated me for it; was a long time ago" (12/11/2018)  . High cholesterol   . History of bronchitis    "used to get it alot" (08/25/2012)  . Hypertension   . Panic attacks   . PTSD (post-traumatic stress disorder)    "have been treated in the past" (08/25/2012)     Past Surgical History:  Procedure Laterality Date  . CARDIAC CATHETERIZATION  01/2005  . CORONARY ARTERY BYPASS GRAFT  01/2005   CABG X3  . Shrapnel Left 1969   LLE; left lateral thumb (required grafting)  . VARICOSE VEIN SURGERY  1980's   LLE     Family History  Problem Relation Age of Onset  . Diabetes Mother   . Heart disease Father   . Cancer Brother      Social History   Socioeconomic History  . Marital status: Divorced    Spouse name:  Not on file  . Number of children: Not on file  . Years of education: Not on file  . Highest education level: Not on file  Occupational History  . Not on file  Social Needs  . Financial resource strain: Not on file  . Food insecurity    Worry: Not on file    Inability: Not on file  . Transportation needs    Medical: Not on file    Non-medical: Not on file  Tobacco Use  . Smoking status: Former Smoker    Packs/day: 2.00    Years: 5.00    Pack years: 10.00  . Smokeless tobacco: Never Used  . Tobacco comment: 08/25/2012 "quit smoking cigarettes 40 yr ago"  Substance  and Sexual Activity  . Alcohol use: Yes    Alcohol/week: 60.0 standard drinks    Types: 60 Standard drinks or equivalent per week    Comment: 12/11/2018 "~ 1 pint of vodka/day"  . Drug use: Yes    Types: Marijuana    Comment: 08/25/2012 "last marijuana 10 years ago"  . Sexual activity: Yes  Lifestyle  . Physical activity    Days per week: Not on file    Minutes per session: Not on file  . Stress: Not on file  Relationships  . Social Musicianconnections    Talks on phone: Not on file    Gets together: Not on file    Attends religious service: Not on file    Active member of club or organization: Not on file    Attends meetings of clubs or organizations: Not on file    Relationship status: Not on file  . Intimate partner violence    Fear of current or ex partner: Not on file    Emotionally abused: Not on file    Physically abused: Not on file    Forced sexual activity: Not on file  Other Topics Concern  . Not on file  Social History Narrative  . Not on file     Allergies  Allergen Reactions  . Codeine Palpitations  . Vicodin [Hydrocodone-Acetaminophen] Nausea And Vomiting, Swelling and Palpitations     Prior to Admission medications   Medication Sig Start Date End Date Taking? Authorizing Provider  FLUoxetine (PROZAC) 20 MG capsule Take 20 mg by mouth daily.   Yes [provider]  furosemide (LASIX) 20 MG tablet Take 1 tablet (20 mg total) by mouth daily. 05/01/19  Yes Lyn RecordsSmith, Henry W, MD  metoprolol succinate (TOPROL-XL) 25 MG 24 hr tablet Take 1 tablet (25 mg total) by mouth daily. 05/29/19  Yes Lyn RecordsSmith, Henry W, MD  nitroGLYCERIN (NITROSTAT) 0.4 MG SL tablet Place 1 tablet (0.4 mg total) under the tongue every 5 (five) minutes as needed for chest pain. 01/24/19  Yes Sagardia, Eilleen KempfMiguel Jose, MD  pantoprazole (PROTONIX) 40 MG tablet Take 1 tablet (40 mg total) by mouth 2 (two) times daily. 06/16/19  Yes Sagardia, Eilleen KempfMiguel Jose, MD  albuterol (PROVENTIL HFA;VENTOLIN HFA) 108 (90  Base) MCG/ACT inhaler Inhale 2 puffs into the lungs every 4 (four) hours as needed for up to 30 days for wheezing or shortness of breath. Patient not taking: Reported on 06/15/2019 03/08/19 05/24/19  Arlyn DunningMcLean, Kelly A, PA-C  folic acid (FOLVITE) 1 MG tablet Take 1 tablet (1 mg total) by mouth daily. Patient not taking: Reported on 10/09/2019 06/16/19   Georgina QuintSagardia, [name], MD  gabapentin (NEURONTIN) 100 MG capsule Take 1 capsule (100 mg total) by mouth 3 (three) times daily. Patient  not taking: Reported on 07/03/2019 06/16/19 06/15/20  Laveda Abbe, NP  losartan (COZAAR) 50 MG tablet Take 1 tablet (50 mg total) by mouth daily. 05/24/19 08/22/19  Lyn Records, MD     Depression screen [facility] 2/9 10/09/2019 03/29/2019 03/21/2019 02/28/2019 01/24/2019  Decreased Interest 0 0 0 0 3  Down, Depressed, Hopeless 0 0 0 0 3  PHQ - 2 Score 0 0 0 0 6  Altered sleeping - - - - 3  Tired, decreased energy - - - - 3  Change in appetite - - - - 2  Feeling bad or failure about yourself  - - - - 3  Trouble concentrating - - - - 2  Moving slowly or fidgety/restless - - - - 3  Suicidal thoughts - - - - (No Data)  PHQ-9 Score - - - - 22  Difficult doing work/chores - - - - -     Fall Risk  10/09/2019 03/29/2019 03/21/2019 03/05/2019 02/28/2019  Falls in the past year? 0 1 0 1 1  Number falls in past yr: 0 1 0 1 1  Injury with Fall? 0 - 1 1 1   Comment - - - head head  Risk for fall due to : - - - Other (Comment) -  Risk for fall due to: Comment - - - possible seizure -  Follow up Falls evaluation completed;Education provided Falls evaluation completed Falls evaluation completed Falls evaluation completed -      PHYSICAL EXAM: BP (!) 149/71 Comment: taken from previous visit  Ht 5\' 11"  (1.803 m)   Wt 226 lb (102.5 kg) Comment: taken from previous visit  BMI 31.52 kg/m    Wt Readings from Last 3 Encounters:  10/09/19 226 lb (102.5 kg)  07/03/19 226 lb 3.2 oz (102.6 kg)  05/24/19 225 lb (102.1 kg)        Education/Counseling provided regarding diet and exercise, prevention of chronic diseases, smoking/tobacco cessation, if applicable, and reviewed "Covered Medicare Preventive Services."

## 2019-10-09 NOTE — Patient Instructions (Addendum)
Thank you for taking time to come for your Medicare Wellness Visit. I appreciate your ongoing commitment to your health goals. Please review the following plan we discussed and let me know if I can assist you in the future.  Ronald Kennedy LPN   Preventive Care 72 Years and Older, Male Preventive care refers to lifestyle choices and visits with your health care provider that can promote health and wellness. This includes:  A yearly physical exam. This is also called an annual well check.  Regular dental and eye exams.  Immunizations.  Screening for certain conditions.  Healthy lifestyle choices, such as diet and exercise. What can I expect for my preventive care visit? Physical exam Your health care provider will check:  Height and weight. These may be used to calculate body mass index (BMI), which is a measurement that tells if you are at a healthy weight.  Heart rate and blood pressure.  Your skin for abnormal spots. Counseling Your health care provider may ask you questions about:  Alcohol, tobacco, and drug use.  Emotional well-being.  Home and relationship well-being.  Sexual activity.  Eating habits.  History of falls.  Memory and ability to understand (cognition).  Work and work Statistician. What immunizations do I need?  Influenza (flu) vaccine  This is recommended every year. Tetanus, diphtheria, and pertussis (Tdap) vaccine  You may need a Td booster every 10 years. Varicella (chickenpox) vaccine  You may need this vaccine if you have not already been vaccinated. Zoster (shingles) vaccine  You may need this after age 72. Pneumococcal conjugate (PCV13) vaccine  One dose is recommended after age 10. Pneumococcal polysaccharide (PPSV23) vaccine  One dose is recommended after age 72. Measles, mumps, and rubella (MMR) vaccine  You may need at least one dose of MMR if you were born in 1957 or later. You may also need a second dose. Meningococcal  conjugate (MenACWY) vaccine  You may need this if you have certain conditions. Hepatitis A vaccine  You may need this if you have certain conditions or if you travel or work in places where you may be exposed to hepatitis A. Hepatitis B vaccine  You may need this if you have certain conditions or if you travel or work in places where you may be exposed to hepatitis B. Haemophilus influenzae type b (Hib) vaccine  You may need this if you have certain conditions. You may receive vaccines as individual doses or as more than one vaccine together in one shot (combination vaccines). Talk with your health care provider about the risks and benefits of combination vaccines. What tests do I need? Blood tests  Lipid and cholesterol levels. These may be checked every 5 years, or more frequently depending on your overall health.  Hepatitis C test.  Hepatitis B test. Screening  Lung cancer screening. You may have this screening every year starting at age 72 if you have a 30-pack-year history of smoking and currently smoke or have quit within the past 15 years.  Colorectal cancer screening. All adults should have this screening starting at age 72 and continuing until age 72. Your health care provider may recommend screening at age 1 if you are at increased risk. You will have tests every 1-10 years, depending on your results and the type of screening test.  Prostate cancer screening. Recommendations will vary depending on your family history and other risks.  Diabetes screening. This is done by checking your blood sugar (glucose) after you have not eaten  for a while (fasting). You may have this done every 1-3 years.  Abdominal aortic aneurysm (AAA) screening. You may need this if you are a current or former smoker.  Sexually transmitted disease (STD) testing. Follow these instructions at home: Eating and drinking  Eat a diet that includes fresh fruits and vegetables, whole grains, lean  protein, and low-fat dairy products. Limit your intake of foods with high amounts of sugar, saturated fats, and salt.  Take vitamin and mineral supplements as recommended by your health care provider.  Do not drink alcohol if your health care provider tells you not to drink.  If you drink alcohol: ? Limit how much you have to 0-2 drinks a day. ? Be aware of how much alcohol is in your drink. In the [place] one drink equals one 12 oz bottle of beer (355 mL), one 5 oz glass of wine (148 mL), or one 1 oz glass of hard liquor (44 mL). Lifestyle  Take daily care of your teeth and gums.  Stay active. Exercise for at least 30 minutes on 5 or more days each week.  Do not use any products that contain nicotine or tobacco, such as cigarettes, e-cigarettes, and chewing tobacco. If you need help quitting, ask your health care provider.  If you are sexually active, practice safe sex. Use a condom or other form of protection to prevent STIs (sexually transmitted infections).  Talk with your health care provider about taking a low-dose aspirin or statin. What's next?  Visit your health care provider once a year for a well check visit.  Ask your health care provider how often you should have your eyes and teeth checked.  Stay up to date on all vaccines. This information is not intended to replace advice given to you by your health care provider. Make sure you discuss any questions you have with your health care provider. Document Released: 12/12/2015 Document Revised: 11/09/2018 Document Reviewed: 11/09/2018 Elsevier Patient Education  2020 [employer].

## 2019-12-04 ENCOUNTER — Emergency Department (HOSPITAL_COMMUNITY)
Admission: EM | Admit: 2019-12-04 | Discharge: 2019-12-05 | Disposition: A | Discharge status: Home / Self Care | Payer: Medicare Other | Attending: Emergency Medicine | Admitting: Emergency Medicine

## 2019-12-04 ENCOUNTER — Other Ambulatory Visit: Payer: Self-pay

## 2019-12-04 ENCOUNTER — Emergency Department (HOSPITAL_COMMUNITY): Payer: Medicare Other

## 2019-12-04 ENCOUNTER — Encounter (HOSPITAL_COMMUNITY): Payer: Self-pay

## 2019-12-04 ENCOUNTER — Telehealth: Payer: Self-pay | Admitting: Emergency Medicine

## 2019-12-04 DIAGNOSIS — Z87891 Personal history of nicotine dependence: Secondary | ICD-10-CM | POA: Insufficient documentation

## 2019-12-04 DIAGNOSIS — R438 Other disturbances of smell and taste: Secondary | ICD-10-CM | POA: Diagnosis not present

## 2019-12-04 DIAGNOSIS — Z951 Presence of aortocoronary bypass graft: Secondary | ICD-10-CM | POA: Insufficient documentation

## 2019-12-04 DIAGNOSIS — Z20822 Contact with and (suspected) exposure to covid-19: Secondary | ICD-10-CM | POA: Insufficient documentation

## 2019-12-04 DIAGNOSIS — K047 Periapical abscess without sinus: Secondary | ICD-10-CM | POA: Diagnosis not present

## 2019-12-04 DIAGNOSIS — I10 Essential (primary) hypertension: Secondary | ICD-10-CM | POA: Diagnosis not present

## 2019-12-04 DIAGNOSIS — Z79899 Other long term (current) drug therapy: Secondary | ICD-10-CM | POA: Insufficient documentation

## 2019-12-04 DIAGNOSIS — I2510 Atherosclerotic heart disease of native coronary artery without angina pectoris: Secondary | ICD-10-CM | POA: Diagnosis not present

## 2019-12-04 DIAGNOSIS — R531 Weakness: Secondary | ICD-10-CM | POA: Diagnosis not present

## 2019-12-04 LAB — CBC
HCT: 37.8 % — ABNORMAL LOW (ref 39.0–52.0)
Hemoglobin: 12.4 g/dL — ABNORMAL LOW (ref 13.0–17.0)
MCH: 30.7 pg (ref 26.0–34.0)
MCHC: 32.8 g/dL (ref 30.0–36.0)
MCV: 93.6 fL (ref 80.0–100.0)
Platelets: 82 10*3/uL — ABNORMAL LOW (ref 150–400)
RBC: 4.04 MIL/uL — ABNORMAL LOW (ref 4.22–5.81)
RDW: 12.7 % (ref 11.5–15.5)
WBC: 3.5 10*3/uL — ABNORMAL LOW (ref 4.0–10.5)
nRBC: 0 % (ref 0.0–0.2)

## 2019-12-04 LAB — URINALYSIS, ROUTINE W REFLEX MICROSCOPIC
Bilirubin Urine: NEGATIVE
Glucose, UA: NEGATIVE mg/dL
Hgb urine dipstick: NEGATIVE
Ketones, ur: 5 mg/dL — AB
Leukocytes,Ua: NEGATIVE
Nitrite: NEGATIVE
Protein, ur: 100 mg/dL — AB
Specific Gravity, Urine: 1.018 (ref 1.005–1.030)
pH: 5 (ref 5.0–8.0)

## 2019-12-04 LAB — COMPREHENSIVE METABOLIC PANEL
ALT: 116 U/L — ABNORMAL HIGH (ref 0–44)
AST: 134 U/L — ABNORMAL HIGH (ref 15–41)
Albumin: 4.1 g/dL (ref 3.5–5.0)
Alkaline Phosphatase: 57 U/L (ref 38–126)
Anion gap: 17 — ABNORMAL HIGH (ref 5–15)
BUN: 33 mg/dL — ABNORMAL HIGH (ref 8–23)
CO2: 20 mmol/L — ABNORMAL LOW (ref 22–32)
Calcium: 9.4 mg/dL (ref 8.9–10.3)
Chloride: 99 mmol/L (ref 98–111)
Creatinine, Ser: 1.49 mg/dL — ABNORMAL HIGH (ref 0.61–1.24)
GFR calc Af Amer: 54 mL/min — ABNORMAL LOW (ref >60)
GFR calc non Af Amer: 46 mL/min — ABNORMAL LOW (ref >60)
Glucose, Bld: 125 mg/dL — ABNORMAL HIGH (ref 70–99)
Potassium: 4.6 mmol/L (ref 3.5–5.1)
Sodium: 136 mmol/L (ref 135–145)
Total Bilirubin: 1.2 mg/dL (ref 0.3–1.2)
Total Protein: 9.8 g/dL — ABNORMAL HIGH (ref 6.5–8.1)

## 2019-12-04 LAB — TROPONIN I (HIGH SENSITIVITY)
Troponin I (High Sensitivity): 11 ng/L (ref <18)
Troponin I (High Sensitivity): 9 ng/L (ref <18)

## 2019-12-04 LAB — LIPASE, BLOOD: Lipase: 47 U/L (ref 11–51)

## 2019-12-04 MED ORDER — SODIUM CHLORIDE 0.9% FLUSH: 3.0000 mL | Freq: Once | INTRAVENOUS | Status: DC

## 2019-12-04 NOTE — Telephone Encounter (Signed)
Patient is wanting Dr.Sagardia know that he is at ER now . They took x-ray and blood/ patient has swollen lymph node on side of neck . Has not been admitted .but nurse said they are waiting on a room.Patient is just letting you know .

## 2019-12-04 NOTE — ED Provider Notes (Signed)
[facility] EMERGENCY DEPARTMENT Provider Note   CSN: [id number] Arrival date & time: 12/04/19  1242     History Chief Complaint  Patient presents with  . Weakness  . Diarrhea    [name] is a 74 y.o. male with past medical history significant for anxiety, CAD s/p CABG x3 in 2015, hepatitis, hypertension, thrombocytopenia, chronic alcohol abuse presents to emergency department today via EMS with multiple chief complaints x 2 days.  Patient is endorsing generalized weakness, loss of appetite and taste, subjective fever and chills, productive cough with yellow sputum, swollen lymph nodes on left side of neck, dental pain, chest pain, nausea without emesis, diarrhea,and dark urine.  He is unable to really describe his chest pain.   He does not remember what he was doing when the chest pain started, chest pain is not worse with exertion, he does not have chest pain currently.  He states he has upper left-sided dental pain.  He has a known broken tooth that states gives him problems every now and again.  He states he currently has dull throbbing sensation that radiates to his left ear.  Pain is worse when eating.  He has not seen a dentist in many years due to lack of dental insurance however states he was told the tooth need to be pulled in the past.  He denies urinary frequency but states he noticed his urine is much darker than usual. He has had 4 episodes of diarrhea in the last 24 hours, he denies any blood in stool or black tarry stool. Patient states he thinks his 73-year-old son recently had croup.  However was not tested for Covid. He denies any other sick contacts or contact with anyone positive for COVID-19. Admits to drinking a pint of vodka a day with last drink yesterday. He denies diaphoresis, sore throat, congestion, neck pain, headache, visual changes, pain pain, lower extremity edema. History provided by patient with additional history obtained from chart  review.     Past Medical History:  Diagnosis Date  . Anginal pain (HCC) 01/2005  . Anxiety   . Arthritis    "knees"  . Chronic lower back pain   . Coronary artery disease   . Depression   . Hepatitis    "don't know what kind; they treated me for it; was a long time ago" (12/11/2018)  . High cholesterol   . History of bronchitis    "used to get it alot" (08/25/2012)  . Hypertension   . Panic attacks   . PTSD (post-traumatic stress disorder)    "have been treated in the past" (08/25/2012)    Patient Active Problem List   Diagnosis Date Noted  . Alcohol abuse with alcohol-induced mood disorder (HCC) 06/16/2019  . PTSD (post-traumatic stress disorder) 06/16/2019  . Thrombocytopenia (HCC) 02/24/2019  . Chronic alcohol abuse 12/09/2015  . Coronary artery disease involving coronary bypass graft of native heart without angina pectoris 08/07/2014  . Essential hypertension 08/07/2014  . Hyperlipidemia 08/07/2014    Past Surgical History:  Procedure Laterality Date  . CARDIAC CATHETERIZATION  01/2005  . CORONARY ARTERY BYPASS GRAFT  01/2005   CABG X3  . Shrapnel Left 1969   LLE; left lateral thumb (required grafting)  . VARICOSE VEIN SURGERY  1980's   LLE       Family History  Problem Relation Age of Onset  . Diabetes Mother   . Heart disease Father   . Cancer Brother  Social History   Tobacco Use  . Smoking status: Former Smoker    Packs/day: 2.00    Years: 5.00    Pack years: 10.00  . Smokeless tobacco: Never Used  . Tobacco comment: 08/25/2012 "quit smoking cigarettes 40 yr ago"  Substance Use Topics  . Alcohol use: Yes    Alcohol/week: 60.0 standard drinks    Types: 60 Standard drinks or equivalent per week    Comment: 12/11/2018 "~ 1 pint of vodka/day"  . Drug use: Yes    Types: Marijuana    Comment: 08/25/2012 "last marijuana 10 years ago"    Home Medications Prior to Admission medications   Medication Sig Start Date End Date Taking? Authorizing  Provider  albuterol (PROVENTIL HFA;VENTOLIN HFA) 108 (90 Base) MCG/ACT inhaler Inhale 2 puffs into the lungs every 4 (four) hours as needed for up to 30 days for wheezing or shortness of breath. Patient not taking: Reported on 06/15/2019 03/08/19 05/24/19  Madilyn Hook A, PA-C  amoxicillin-clavulanate (AUGMENTIN) 875-125 MG tablet Take 1 tablet by mouth every 12 (twelve) hours. 12/05/19   Kharter Sestak E, PA-C  FLUoxetine (PROZAC) 20 MG capsule Take 20 mg by mouth daily.    [provider]  folic acid (FOLVITE) 1 MG tablet Take 1 tablet (1 mg total) by mouth daily. Patient not taking: Reported on 10/09/2019 06/16/19   Horald Pollen, MD  furosemide (LASIX) 20 MG tablet Take 1 tablet (20 mg total) by mouth daily. 05/01/19   Belva Crome, MD  gabapentin (NEURONTIN) 100 MG capsule Take 1 capsule (100 mg total) by mouth 3 (three) times daily. Patient not taking: Reported on 07/03/2019 06/16/19 06/15/20  Ethelene Hal, NP  losartan (COZAAR) 50 MG tablet Take 1 tablet (50 mg total) by mouth daily. 05/24/19 08/22/19  Belva Crome, MD  metoprolol succinate (TOPROL-XL) 25 MG 24 hr tablet Take 1 tablet (25 mg total) by mouth daily. 05/29/19   Belva Crome, MD  nitroGLYCERIN (NITROSTAT) 0.4 MG SL tablet Place 1 tablet (0.4 mg total) under the tongue every 5 (five) minutes as needed for chest pain. 01/24/19   Horald Pollen, MD  pantoprazole (PROTONIX) 40 MG tablet Take 1 tablet (40 mg total) by mouth 2 (two) times daily. 06/16/19   Horald Pollen, MD    Allergies    Codeine and Vicodin [hydrocodone-acetaminophen]  Review of Systems   Review of Systems All other systems are reviewed and are negative for acute change except as noted in the HPI.  Physical Exam Updated Vital Signs BP (!) 193/79 (BP Location: Left Arm)   Pulse 65   Temp 97.8 F (36.6 C) (Oral)   Resp 16   SpO2 99%   Physical Exam Vitals and nursing note reviewed.  Constitutional:      General: He is  not in acute distress.    Appearance: He is not ill-appearing or toxic-appearing.  HENT:     Head: Normocephalic and atraumatic.     Right Ear: Tympanic membrane and external ear normal.     Left Ear: Ear canal and external ear normal.  No middle ear effusion. Tympanic membrane is erythematous and bulging.     Nose: Nose normal.     Mouth/Throat:     Mouth: Mucous membranes are dry.     Dentition: Dental caries present.     Pharynx: Oropharynx is clear. Uvula midline. No pharyngeal swelling, oropharyngeal exudate, posterior oropharyngeal erythema or uvula swelling.     Tonsils: No  tonsillar exudate or tonsillar abscesses.      Comments: Patient wearing dentures. When removed he has multiple missing teeth and the few teeth he has have decay and caries. Palpated gumline without signs of abscess. No external facial swelling or asymmetry noted.  No swelling of submandibular region.   Eyes:     General: No scleral icterus.       Right eye: No discharge.        Left eye: No discharge.     Extraocular Movements: Extraocular movements intact.     Conjunctiva/sclera: Conjunctivae normal.     Pupils: Pupils are equal, round, and reactive to light.  Neck:     Vascular: No JVD.  Cardiovascular:     Rate and Rhythm: Normal rate and regular rhythm.     Pulses: Normal pulses.          Radial pulses are 2+ on the right side and 2+ on the left side.     Heart sounds: Normal heart sounds.  Pulmonary:     Effort: Pulmonary effort is normal.     Breath sounds: Normal breath sounds.     Comments: Lungs clear to auscultation in all fields. Symmetric chest rise. No wheezing, rales, or rhonchi. Chest:     Chest wall: No tenderness.  Abdominal:     General: Bowel sounds are normal. There is no distension.     Tenderness: There is no right CVA tenderness or left CVA tenderness.     Comments: Abdomen is soft, non-distended, and non-tender in all quadrants. No rigidity, no guarding. No peritoneal signs.    Musculoskeletal:        General: Normal range of motion.     Cervical back: Normal range of motion. No muscular tenderness.     Right lower leg: No edema.     Left lower leg: No edema.  Skin:    General: Skin is warm and dry.     Capillary Refill: Capillary refill takes less than 2 seconds.  Neurological:     Mental Status: He is oriented to person, place, and time.     GCS: GCS eye subscore is 4. GCS verbal subscore is 5. GCS motor subscore is 6.     Comments: Fluent speech, no facial droop.  Psychiatric:        Behavior: Behavior normal.     ED Results / Procedures / Treatments   Labs (all labs ordered are listed, but only abnormal results are displayed) Labs Reviewed  COMPREHENSIVE METABOLIC PANEL - Abnormal; Notable for the following components:      Result Value   CO2 20 (*)    Glucose, Bld 125 (*)    BUN 33 (*)    Creatinine, Ser 1.49 (*)    Total Protein 9.8 (*)    AST 134 (*)    ALT 116 (*)    GFR calc non Af Amer 46 (*)    GFR calc Af Amer 54 (*)    Anion gap 17 (*)    All other components within normal limits  CBC - Abnormal; Notable for the following components:   WBC 3.5 (*)    RBC 4.04 (*)    Hemoglobin 12.4 (*)    HCT 37.8 (*)    Platelets 82 (*)    All other components within normal limits  URINALYSIS, ROUTINE W REFLEX MICROSCOPIC - Abnormal; Notable for the following components:   Ketones, ur 5 (*)    Protein, ur 100 (*)  Bacteria, UA RARE (*)    All other components within normal limits  NOVEL CORONAVIRUS, NAA (HOSP ORDER, SEND-OUT TO REF LAB; TAT 18-24 HRS)  LIPASE, BLOOD  POC SARS CORONAVIRUS 2 AG -  ED  TROPONIN I (HIGH SENSITIVITY)  TROPONIN I (HIGH SENSITIVITY)    EKG EKG Interpretation  Date/Time:  Tuesday December 04 2019 13:21:13 EST Ventricular Rate:  80 PR Interval:  194 QRS Duration: 82 QT Interval:  402 QTC Calculation: 463 R Axis:   58 Text Interpretation: Normal sinus rhythm Normal ECG When compared with ECG of  06/19/2019, No significant change was found Confirmed by Dione Booze [phone]) on 12/04/2019 10:59:15 PM   Radiology DG Chest 2 View  Result Date: 12/04/2019 CLINICAL DATA:  Weakness, loss of appetite, diarrhea, chills, swollen lymph nodes for 2 days, chest pain, history of hypertension, former smoker, coronary artery disease EXAM: CHEST - 2 VIEW COMPARISON:  06/19/2019 FINDINGS: Normal heart size post CABG. Mediastinal contours and pulmonary vascularity normal. Atherosclerotic calcification aorta. Minimal scarring in lingula. Lungs otherwise clear. No infiltrate, pleural effusion, or pneumothorax. Osseous structures unremarkable. IMPRESSION: No acute abnormalities. Electronically Signed   By: Ulyses Southward M.D.   On: 12/04/2019 14:54    Procedures Procedures (including critical care time)  Medications Ordered in ED Medications  sodium chloride flush (NS) 0.9 % injection 3 mL (has no administration in time range)  sodium chloride 0.9 % bolus 500 mL (0 mLs Intravenous Stopped 12/05/19 0315)  ondansetron (ZOFRAN) injection 4 mg (4 mg Intravenous Given 12/05/19 0121)  amoxicillin-clavulanate (AUGMENTIN) 875-125 MG per tablet 1 tablet (1 tablet Oral Given 12/05/19 0320)    ED Course  I have reviewed the triage vital signs and the nursing notes.  Pertinent labs & imaging results that were available during my care of the patient were reviewed by me and considered in my medical decision making (see chart for details).  Vitals:   12/05/19 0215 12/05/19 0245 12/05/19 0300 12/05/19 0315  BP: (!) 141/63 (!) 180/76 (!) 180/75 (!) 177/76  Pulse: 62 69 70 68  Resp: 13 20 15 18   Temp:      TempSrc:      SpO2: 97% 98% 96% 97%    MDM Rules/Calculators/A&P                     Patient seen and examined. Patient unfortunately had prolonged wait time in the lobby of 10 hours due to high volume. He has been afebrile, hemodynamically stable, no hypoxia.  On my exam he is well appearing, in no acute distress. Mucus  membranes are dry. He has chronic dental decay without  gross abscess.  Exam unconcerning for Ludwig's angina or spread of infection.  Lungs are clear to auscultation in all fields, normal work of breathing, symmetric chest rise. Abdomen is non tender, no peritoneal signs. No signs of volume overload. Does have signs of left sided otitis media. No signs of alcohol withdrawal. I viewed labs ordered in triage. Delta troponin flat. Labs are overall consistent with baseline including hemoglobin, thrombocytopenia, renal function. No severe electrolyte derangements, normal lipase. Does have slightly elevated anion gap of 17, possible dehydration given lack of PO intake since symptom onset,given small bolus IVF. Also with elevated liver enzymes AST 134 and ALT 116, drinks alcohol daily. Total bilirubin is normal and he has no benign abdominal exam, do not think emergent imaging is needed at this time.  UA is without signs of infection. I viewed pt's chest  xray and it does not suggest acute infectious processes. EKG shows normal sinus rhythm, no ischemic changes. Patient given zofran for nausea.  Rapid covid test is negative. Will perform send out covid test to confirm. Patient ambulated in the emergency department without respiratory distress, tachycardia, or hypoxia. SpO2 during ambulation >94% on room air. On reassessment patient is tolerating PO intake while in the department. Serial abdominal exams are benign. Given reassuring exam and overall work up will discharge home with symptomatic care as well as prescription for Augmentin to cover for for probable dental infection and otitis media. Patient is aware he needs to self quarantine until he has the covid test result. We discussed symptomatic treatments to do test result is positive and recommend he avoid tylenol because of elevated liver enzymes.  The patient appears reasonably screened and/or stabilized for discharge and I doubt any other medical condition or  other [facility] requiring further screening, evaluation, or treatment in the ED at this time prior to discharge. The patient is safe for discharge with strict return precautions discussed. Recommend follow up with pcp within 1 week to have liver enzymes checked.  Also urged patient to follow-up with dentist and given information for on call dentist. The patient was discussed with and seen by Dr. Preston Fleeting who agrees with the treatment plan.   [name] was evaluated in Emergency Department on 12/05/2019 for the symptoms described in the history of present illness. He was evaluated in the context of the global COVID-19 pandemic, which necessitated consideration that the patient might be at risk for infection with the SARS-CoV-2 virus that causes COVID-19. Institutional protocols and algorithms that pertain to the evaluation of patients at risk for COVID-19 are in a state of rapid change based on information released by regulatory bodies including the CDC and federal and state organizations. These policies and algorithms were followed during the patient's care in the ED.   Portions of this note were generated with Scientist, clinical (histocompatibility and immunogenetics). Dictation errors may occur despite best attempts at proofreading.   Final Clinical Impression(s) / ED Diagnoses Final diagnoses:  Generalized weakness  Dental infection    Rx / DC Orders ED Discharge Orders         Ordered    amoxicillin-clavulanate (AUGMENTIN) 875-125 MG tablet  Every 12 hours,   Status:  Discontinued     12/05/19 0307    amoxicillin-clavulanate (AUGMENTIN) 875-125 MG tablet  Every 12 hours     12/05/19 0310           Caster Fayette, Caroleen Hamman, PA-C 12/05/19 0810    Dione Booze, MD 12/05/19 [id number]

## 2019-12-04 NOTE — ED Notes (Signed)
Beverly (Sister#[phone]) called for an update.

## 2019-12-04 NOTE — ED Triage Notes (Addendum)
Pt presents via [facility] from home, weakness, loss of appetite, diarrhea, chills and swollen lymph nodes x2 days. Pt now reports generalized chest pain, states "I just ache all over"    pts son recently sick but not tested for covid   188/70 98 98.7 temporal  CBG 140

## 2019-12-05 LAB — POC SARS CORONAVIRUS 2 AG -  ED: SARS Coronavirus 2 Ag: NEGATIVE

## 2019-12-05 MED ORDER — AMOXICILLIN-POT CLAVULANATE 875-125 MG PO TABS
1.0000 | ORAL_TABLET | Freq: Once | ORAL | Status: AC
  Administered 2019-12-05: 1 via ORAL
  Filled 2019-12-05: qty 1

## 2019-12-05 MED ORDER — ONDANSETRON HCL 4 MG/2ML IJ SOLN
4.0000 mg | Freq: Once | INTRAMUSCULAR | Status: AC
  Administered 2019-12-05: 4 mg via INTRAVENOUS
  Filled 2019-12-05: qty 2

## 2019-12-05 MED ORDER — AMOXICILLIN-POT CLAVULANATE 875-125 MG PO TABS: 1.0000 | ORAL_TABLET | Freq: Two times a day (BID) | ORAL | 0 refills | Status: DC

## 2019-12-05 MED ORDER — SODIUM CHLORIDE 0.9 % IV BOLUS
500.0000 mL | Freq: Once | INTRAVENOUS | Status: AC
  Administered 2019-12-05: 500 mL via INTRAVENOUS

## 2019-12-05 NOTE — Discharge Instructions (Addendum)
Follow up with primary care doctor to have your liver enzymes rechecked. They were elevated today.  Antibiotic for Augmentin has been sent to your pharmacy for possible dental infection. Please take as prescribed. This medicine can cause upset stomach and diarrhea. Please take with food and monitor your symptoms.    You were seen in the ED for weakness and diarrhea.  I suspect you have a virus. We tested your for COVID-19 (coronavirus) infection.  It is also possible you could have other viral upper respiratory infection from another virus.    Test results come back in 48 hours, sometimes sooner.  Someone will call you if ypu are positive for COVID. If the result is negative you can see it on your MyChart.  Treatment of your illness and symptoms will include self-isolation, monitoring of symptoms and supportive care with over-the-counter medicines.    Return to the ED if there is increased work of breathing, shortness of breath, inability to tolerate fluids, weakness, chest pain.  If your test results are POSITIVE, the following isolation requirements need to be met to return to work and resume essential activities: At least 10 days since symptom onset  72 hours of absence of fever without antifever medicine (ibuprofen, acetaminophen). A fever is temperature of 100.56F or greater. Improvement of respiratory symptoms  If your test is NEGATIVE, you may return to work and essential activities as long as your symptoms have improved and you do not have a fever for a total of 3 days.  Call your job and notify them that your test result was negative to see if they will allow you to return to work.   Stay well-hydrated. Rest. You can use over the counter medications to help with symptoms: 200 mg ibuprofen (motrin, aleve, advil) every 6 hours for fevers, sore throat, headaches, generalized body aches and malaise.  Only take ibuprofen if absolutely needed. It can cause stomach upset and stomach bleeding  in the elderly. Please avoid Tylenol as you are liver functions were elevated today as we discussed. Flonsae once daily to help with congestion Allergy medication (loratadine, cetirizine, etc) Ask the pharmacist about safe cough medicines you can take because of your high blood pressure. Wash your hands often to prevent spread.

## 2019-12-05 NOTE — ED Notes (Signed)
Pt's O2 sats maintained at 97% on RA while ambulating around the room.

## 2019-12-07 NOTE — Telephone Encounter (Signed)
During ER visit patient had two COVID tests administered. Pt cannot see the second lab posted to his [facility] and would like to be able to see his result. Please advise

## 2019-12-08 LAB — NOVEL CORONAVIRUS, NAA (HOSP ORDER, SEND-OUT TO REF LAB; TAT 18-24 HRS): SARS-CoV-2, NAA: NOT DETECTED

## 2020-01-01 ENCOUNTER — Ambulatory Visit: Payer: Medicare Other | Admitting: Emergency Medicine

## 2020-01-09 ENCOUNTER — Ambulatory Visit: Payer: Medicare Other | Admitting: Emergency Medicine

## 2020-02-25 ENCOUNTER — Other Ambulatory Visit: Payer: Self-pay | Admitting: Interventional Cardiology

## 2020-02-25 DIAGNOSIS — R600 Localized edema: Secondary | ICD-10-CM

## 2020-02-27 ENCOUNTER — Other Ambulatory Visit: Payer: Self-pay

## 2020-02-27 ENCOUNTER — Telehealth (INDEPENDENT_AMBULATORY_CARE_PROVIDER_SITE_OTHER): Payer: Medicare Other | Admitting: Registered Nurse

## 2020-02-27 ENCOUNTER — Encounter: Payer: Self-pay | Admitting: Registered Nurse

## 2020-02-27 VITALS — Temp 97.2°F

## 2020-02-27 DIAGNOSIS — N529 Male erectile dysfunction, unspecified: Secondary | ICD-10-CM | POA: Diagnosis not present

## 2020-02-27 NOTE — Patient Instructions (Signed)
° ° ° °  If you have lab work done today you will be contacted with your lab results within the next 2 weeks.  If you have not heard from us then please contact us. The fastest way to get your results is to register for My Chart. ° ° °IF you received an x-ray today, you will receive an invoice from [facility]. Please contact [facility] at [phone] with questions or concerns regarding your invoice.  ° °IF you received labwork today, you will receive an invoice from [facility]. Please contact [facility] at [phone] with questions or concerns regarding your invoice.  ° °Our billing staff will not be able to assist you with questions regarding bills from these companies. ° °You will be contacted with the lab results as soon as they are available. The fastest way to get your results is to activate your My Chart account. Instructions are located on the last page of this paperwork. If you have not heard from us regarding the results in 2 weeks, please contact this office. °  ° ° ° °

## 2020-03-07 ENCOUNTER — Telehealth: Payer: Self-pay | Admitting: Emergency Medicine

## 2020-03-07 NOTE — Telephone Encounter (Signed)
Can you please sign the 3/31 visit do that I may send referral? Thank you

## 2020-03-09 NOTE — Progress Notes (Signed)
Telemedicine Encounter- SOAP NOTE Established Patient  This telephone encounter was conducted with the patient's (or proxy's) verbal consent via audio telecommunications: yes  Patient was instructed to have this encounter in a suitably private space; and to only have persons present to whom they give permission to participate. In addition, patient identity was confirmed by use of name plus two identifiers (DOB and address).  I discussed the limitations, risks, security and privacy concerns of performing an evaluation and management service by telephone and the availability of in person appointments. I also discussed with the patient that there may be a patient responsible charge related to this service. The patient expressed understanding and agreed to proceed.  I spent a total of 14 minutes talking with the patient or their proxy.  Chief Complaint  Patient presents with  . medication request    ED medication or urology referral if needed     Subjective   [name] is a 74 y.o. established patient. Telephone visit today for ED  HPI Pt reports a number of months of trouble achieving and maintaining erection.  No clear cause Pt has significant cardiac history Interested in medication Interested in referral to urology if needed.   Patient Active Problem List   Diagnosis Date Noted  . Alcohol abuse with alcohol-induced mood disorder (HCC) 06/16/2019  . PTSD (post-traumatic stress disorder) 06/16/2019  . Thrombocytopenia (HCC) 02/24/2019  . Chronic alcohol abuse 12/09/2015  . Coronary artery disease involving coronary bypass graft of native heart without angina pectoris 08/07/2014  . Essential hypertension 08/07/2014  . Hyperlipidemia 08/07/2014    Past Medical History:  Diagnosis Date  . Anginal pain (HCC) 01/2005  . Anxiety   . Arthritis    "knees"  . Chronic lower back pain   . Coronary artery disease   . Depression   . Hepatitis    "don't know what kind; they  treated me for it; was a long time ago" (12/11/2018)  . High cholesterol   . History of bronchitis    "used to get it alot" (08/25/2012)  . Hypertension   . Panic attacks   . PTSD (post-traumatic stress disorder)    "have been treated in the past" (08/25/2012)    Current Outpatient Medications  Medication Sig Dispense Refill  . FLUoxetine (PROZAC) 20 MG capsule Take 20 mg by mouth daily.    . furosemide (LASIX) 20 MG tablet Take 1 tablet by mouth once daily 90 tablet 0  . losartan (COZAAR) 50 MG tablet Take 1 tablet (50 mg total) by mouth daily. 90 tablet 3  . metoprolol succinate (TOPROL-XL) 25 MG 24 hr tablet Take 1 tablet (25 mg total) by mouth daily. 90 tablet 3  . pantoprazole (PROTONIX) 40 MG tablet Take 1 tablet (40 mg total) by mouth 2 (two) times daily. 60 tablet 0  . albuterol (PROVENTIL HFA;VENTOLIN HFA) 108 (90 Base) MCG/ACT inhaler Inhale 2 puffs into the lungs every 4 (four) hours as needed for up to 30 days for wheezing or shortness of breath. (Patient not taking: Reported on 06/15/2019) 1 Inhaler 0   No current facility-administered medications for this visit.    Allergies  Allergen Reactions  . Codeine Palpitations  . Vicodin [Hydrocodone-Acetaminophen] Nausea And Vomiting, Swelling and Palpitations    Social History   Socioeconomic History  . Marital status: Divorced    Spouse name: Not on file  . Number of children: Not on file  . Years of education: Not on file  .  Highest education level: Not on file  Occupational History  . Not on file  Tobacco Use  . Smoking status: Former Smoker    Packs/day: 2.00    Years: 5.00    Pack years: 10.00  . Smokeless tobacco: Never Used  . Tobacco comment: 08/25/2012 "quit smoking cigarettes 40 yr ago"  Substance and Sexual Activity  . Alcohol use: Yes    Alcohol/week: 60.0 standard drinks    Types: 60 Standard drinks or equivalent per week    Comment: 12/11/2018 "~ 1 pint of vodka/day"  . Drug use: Yes    Types:  Marijuana    Comment: 08/25/2012 "last marijuana 10 years ago"  . Sexual activity: Yes  Other Topics Concern  . Not on file  Social History Narrative  . Not on file   Social Determinants of Health   Financial Resource Strain:   . Difficulty of Paying Living Expenses:   Food Insecurity:   . Worried About Charity fundraiser in the Last Year:   . Arboriculturist in the Last Year:   Transportation Needs:   . Film/video editor (Medical):   Marland Kitchen Lack of Transportation (Non-Medical):   Physical Activity:   . Days of Exercise per Week:   . Minutes of Exercise per Session:   Stress:   . Feeling of Stress :   Social Connections:   . Frequency of Communication with Friends and Family:   . Frequency of Social Gatherings with Friends and Family:   . Attends Religious Services:   . Active Member of Clubs or Organizations:   . Attends Archivist Meetings:   Marland Kitchen Marital Status:   Intimate Partner Violence:   . Fear of Current or Ex-Partner:   . Emotionally Abused:   Marland Kitchen Physically Abused:   . Sexually Abused:     Review of Systems  Constitutional: Negative.   HENT: Negative.   Eyes: Negative.   Respiratory: Negative.   Cardiovascular: Negative.   Gastrointestinal: Negative.   Genitourinary: Negative.   Musculoskeletal: Negative.   Skin: Negative.   Neurological: Negative.   Endo/Heme/Allergies: Negative.   Psychiatric/Behavioral: Negative.   All other systems reviewed and are negative.   Objective   Vitals as reported by the patient: Today's Vitals   02/27/20 0833  Temp: (!) 97.2 F (36.2 C)  TempSrc: Temporal    [name] was seen today for medication request.  Diagnoses and all orders for this visit:  Erectile dysfunction, unspecified erectile dysfunction type -     Ambulatory referral to Urology   PLAN  Given his cv history, will refer to urology for further assessment  Patient encouraged to call clinic with any questions, comments, or concerns.  I  discussed the assessment and treatment plan with the patient. The patient was provided an opportunity to ask questions and all were answered. The patient agreed with the plan and demonstrated an understanding of the instructions.   The patient was advised to call back or seek an in-person evaluation if the symptoms worsen or if the condition fails to improve as anticipated.  I provided 14 minutes of non-face-to-face time during this encounter.  Maximiano Coss, NP  Primary Care at [facility]

## 2020-03-09 NOTE — Telephone Encounter (Signed)
Complete -   Sorry for the delay!  Rich

## 2020-04-08 IMAGING — CR DG CHEST 2V
2 series · 2 of 2 positions shown · non-contrast
Comparison: 08/01/2018

CLINICAL DATA: Chest pain.

EXAM:
CHEST - 2 VIEW

[chest pa]
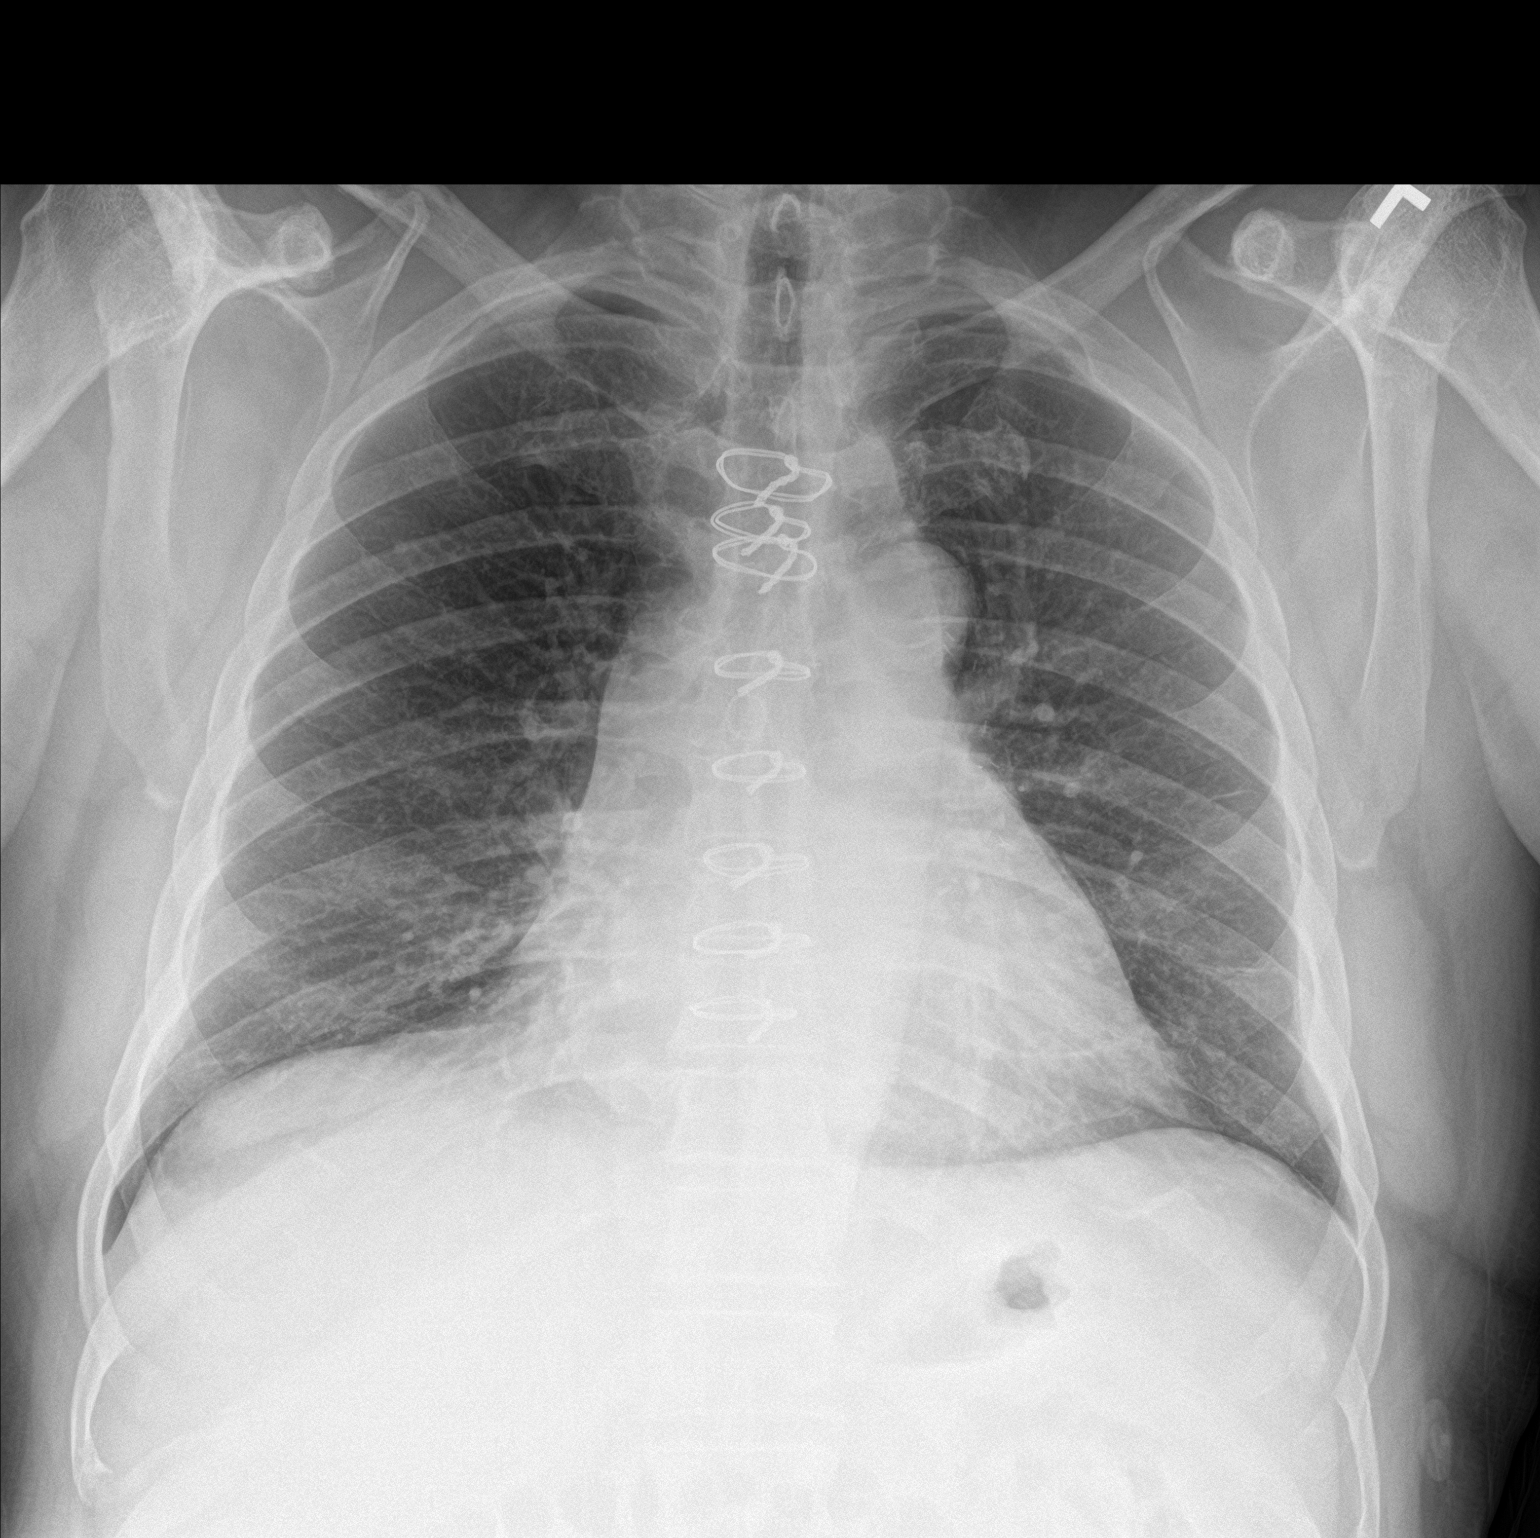

[chest lat]
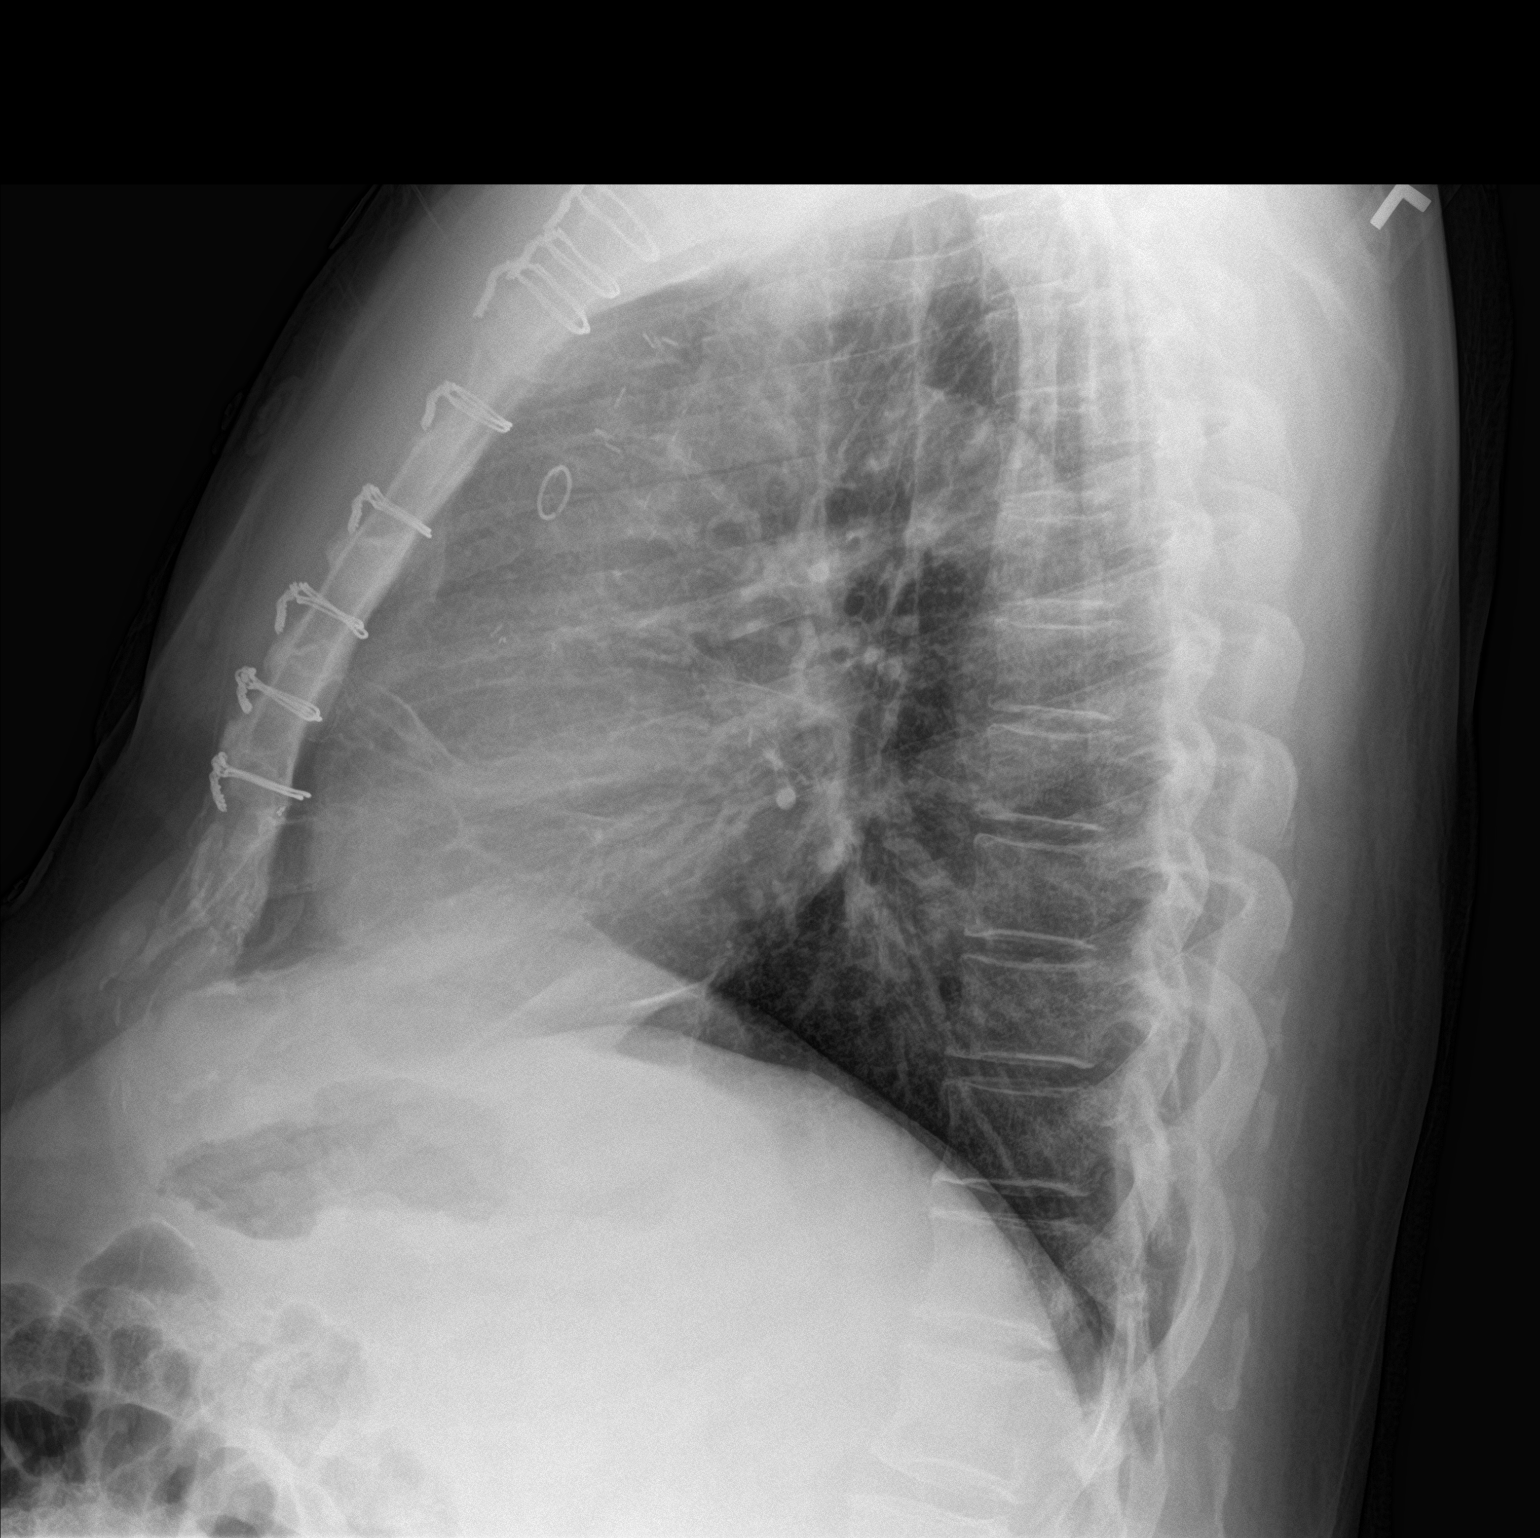

[2 of 2 positions shown; findings below may reference images not displayed]

FINDINGS: Heart size and pulmonary vascularity are normal. No infiltrates or
effusions. Minimal linear scarring in the left midzone. CABG. No
significant bone abnormality.
IMPRESSION: No active cardiopulmonary disease.

## 2020-04-16 NOTE — Progress Notes (Deleted)
CARDIOLOGY OFFICE NOTE  Date:  04/22/2020    [name] Date of Birth: Apr 11, 1946 Medical Record #[id number]  PCP:  Georgina Quint, MD  Cardiologist:  Princella Ion chief complaint on file.   History of Present Illness: [name] is a 74 y.o. male who presents today for an almost one year visit. Seen for Dr. Katrinka Blazing.   He has a history of known CAD s/p CABG x3, with early graft failure and re-do surgery (2006), HTN, HLD,PTSD,alcohol abuse, obesity with stable chest pain.  Last seen in June of 2020 - had lost some weight - felt to be doing ok.   The patient {does/does not:200015} have symptoms concerning for COVID-19 infection (fever, chills, cough, or new shortness of breath).   Comes in today. Here with   Past Medical History:  Diagnosis Date  . Anginal pain (HCC) 01/2005  . Anxiety   . Arthritis    "knees"  . Chronic lower back pain   . Coronary artery disease   . Depression   . Hepatitis    "don't know what kind; they treated me for it; was a long time ago" (12/11/2018)  . High cholesterol   . History of bronchitis    "used to get it alot" (08/25/2012)  . Hypertension   . Panic attacks   . PTSD (post-traumatic stress disorder)    "have been treated in the past" (08/25/2012)    Past Surgical History:  Procedure Laterality Date  . CARDIAC CATHETERIZATION  01/2005  . CORONARY ARTERY BYPASS GRAFT  01/2005   CABG X3  . Shrapnel Left 1969   LLE; left lateral thumb (required grafting)  . VARICOSE VEIN SURGERY  1980's   LLE     Medications: No outpatient medications have been marked as taking for the 04/23/20 encounter (Appointment) with Rosalio Macadamia, NP.     Allergies: Allergies  Allergen Reactions  . Codeine Palpitations  . Vicodin [Hydrocodone-Acetaminophen] Nausea And Vomiting, Swelling and Palpitations    Social History: The patient  reports that he has quit smoking. He has a 10.00 pack-year smoking history. He has never used  smokeless tobacco. He reports current alcohol use of about 60.0 standard drinks of alcohol per week. He reports current drug use. Drug: Marijuana.   Family History: The patient's ***family history includes Cancer in his brother; Diabetes in his mother; Heart disease in his father.   Review of Systems: Please see the history of present illness.   All other systems are reviewed and negative.   Physical Exam: VS:  There were no vitals taken for this visit. Marland Kitchen  BMI There is no height or weight on file to calculate BMI.  Wt Readings from Last 3 Encounters:  10/09/19 226 lb (102.5 kg)  07/03/19 226 lb 3.2 oz (102.6 kg)  05/24/19 225 lb (102.1 kg)    General: Pleasant. Well developed, well nourished and in no acute distress.   HEENT: Normal.  Neck: Supple, no JVD, carotid bruits, or masses noted.  Cardiac: ***Regular rate and rhythm. No murmurs, rubs, or gallops. No edema.  Respiratory:  Lungs are clear to auscultation bilaterally with normal work of breathing.  GI: Soft and nontender.  MS: No deformity or atrophy. Gait and ROM intact.  Skin: Warm and dry. Color is normal.  Neuro:  Strength and sensation are intact and no gross focal deficits noted.  Psych: Alert, appropriate and with normal affect.   LABORATORY DATA:  EKG:  EKG {ACTION;  IS/IS [id number]} ordered today.  Personally reviewed by me. This demonstrates ***.  Lab Results  Component Value Date   WBC 3.5 (L) 12/04/2019   HGB 12.4 (L) 12/04/2019   HCT 37.8 (L) 12/04/2019   PLT 82 (L) 12/04/2019   GLUCOSE 125 (H) 12/04/2019   CHOL 251 (H) 08/26/2012   TRIG 282 (H) 08/26/2012   HDL 36 (L) 08/26/2012   LDLCALC 159 (H) 08/26/2012   ALT 116 (H) 12/04/2019   AST 134 (H) 12/04/2019   NA 136 12/04/2019   K 4.6 12/04/2019   CL 99 12/04/2019   CREATININE 1.49 (H) 12/04/2019   BUN 33 (H) 12/04/2019   CO2 20 (L) 12/04/2019   INR 1.3 (H) 02/24/2019   HGBA1C 5.8 (H) 01/24/2019     BNP (last 3 results) No results for  input(s): BNP in the last 8760 hours.  ProBNP (last 3 results) No results for input(s): PROBNP in the last 8760 hours.   Other Studies Reviewed Today:  Myoview 03-30-15  Echo Study Conclusions 03/29/14  - Left ventricle: The cavity size was normal. Wall thickness  was increased in a pattern of moderate LVH. Systolic  function was normal. The estimated ejection fraction was  in the range of 50% to 55%. Wall motion was normal; there  were no regional wall motion abnormalities. Left  ventricular diastolic function parameters were normal.  - Aortic valve: Trivial regurgitation.  - Pulmonary arteries: PA peak pressure: 25mm Hg (S).  - Pericardium, extracardiac: A trivial pericardial effusion  was identified. Features were not consistent with  tamponade physiology.     ASSESSMENT & PLAN:    1. CAD  2. HTN  3. HLD  4. Chronic diastolic HF  5. Chronic alcohol abuse   1. Coronary artery disease involving coronary bypass graft of native heart without angina pectoris   2. Essential hypertension   3. Other hyperlipidemia   4. Chronic diastolic heart failure ([place])   5. Pedal edema   6. Chronic alcohol abuse   7. Educated About Covid-19 Virus Infection    PLAN:    In order of problems listed above:  1. Secondary risk protection discussed.  He has a difficult time with the concept.  We need better blood pressure control, moderate aerobic activity, and some means to control lipids.  No recent lipid panel.  Statin intolerant. 2. Losartan to 50 mg/day for better BP control.  Cmet in 2 weeks.  At that same time he will have a lipid panel 3. His lipids are terrible.  I have referred him to the lipid clinic but he could not afford alternative therapy.  I will check a lipid panel today.  Further management will be dependent upon findings. 4. Improved on diuretic therapy.  May need to add aldosterone receptor antagonist or increase loop diuretic intensity.  Cmet and lipid  panel obtained today.  Clinical follow-up in 6 months.  Losartan dose increased.  Marland Kitchen COVID-19 Education: The signs and symptoms of COVID-19 were discussed with the patient and how to seek care for testing (follow up with PCP or arrange E-visit).  The importance of social distancing, staying at home, hand hygiene and wearing a mask when out in public were discussed today.  Current medicines are reviewed with the patient today.  The patient does not have concerns regarding medicines other than what has been noted above.  The following changes have been made:  See above.  Labs/ tests ordered today include:   No orders of the  defined types were placed in this encounter.    Disposition:   FU with *** in {gen number 2-63:785885} {Days to years:10300}.   Patient is agreeable to this plan and will call if any problems develop in the interim.   SignedNorma Fredrickson, NP  04/22/2020 12:46 PM  [facility] [address] [place], [place]  [address] Phone: [phone] Fax: [phone]

## 2020-04-21 ENCOUNTER — Telehealth: Payer: Self-pay | Admitting: Emergency Medicine

## 2020-04-21 NOTE — Telephone Encounter (Signed)
Pt called in regard to medical release form that should have been provided by his attorney Mr. Margo Common. Pt states his attorney called in earlier and provided his office/fax information. Complains of office customer service and lack of communication.

## 2020-04-22 ENCOUNTER — Other Ambulatory Visit: Payer: Self-pay | Admitting: Emergency Medicine

## 2020-04-22 NOTE — Telephone Encounter (Signed)
Please advise on if these pages has been faxed to patient's attorney

## 2020-04-22 NOTE — Telephone Encounter (Signed)
Requested Prescriptions  Pending Prescriptions Disp Refills  . pantoprazole (PROTONIX) 40 MG tablet [Pharmacy Med Name: Pantoprazole Sodium 40 MG Oral Tablet Delayed Release] 60 tablet 0    Sig: Take 1 tablet by mouth twice daily     Gastroenterology: Proton Pump Inhibitors Passed - 04/22/2020 10:22 AM      Passed - Valid encounter within last 12 months    Recent Outpatient Visits          1 month ago Erectile dysfunction, unspecified erectile dysfunction type   [facility] at [facility], Gerlene Burdock, NP   6 months ago Medicare annual wellness visit, subsequent   Primary Care at [facility], [place], MD   9 months ago Hypertensive heart disease with chronic diastolic congestive heart failure [facility])   Primary Care at [facility], [place], MD   1 year ago Hypertensive heart disease with chronic diastolic congestive heart failure [facility])   Primary Care at [facility], [place], MD   1 year ago Hypertensive heart disease with chronic diastolic congestive heart failure [facility])   Primary Care at [facility], Eilleen Kempf, MD      Future Appointments            Tomorrow Rosalio Macadamia, NP [facility], [facility]

## 2020-04-22 NOTE — Telephone Encounter (Signed)
Patients a attorney called looking for records to be faxed / records where faxed 71 pages because pt has a deadline of Monday / also copies of records mailed to patients attorney

## 2020-04-22 NOTE — Telephone Encounter (Signed)
Pt called and wanted to follow up and give him a call  When all records have been sent and received.

## 2020-04-22 NOTE — Telephone Encounter (Signed)
Pt called the office these Morning state his Don,t want to talk to me a transfer the called to [facility]

## 2020-04-23 ENCOUNTER — Ambulatory Visit: Payer: Medicare Other | Admitting: Nurse Practitioner

## 2020-04-23 NOTE — Telephone Encounter (Signed)
[name] has taken care of it.

## 2020-04-29 ENCOUNTER — Telehealth: Payer: Self-pay | Admitting: Emergency Medicine

## 2020-04-29 ENCOUNTER — Emergency Department (HOSPITAL_COMMUNITY): Payer: Medicare Other

## 2020-04-29 ENCOUNTER — Emergency Department (HOSPITAL_COMMUNITY)
Admission: EM | Admit: 2020-04-29 | Discharge: 2020-04-29 | Disposition: A | Discharge status: Home / Self Care | Payer: Medicare Other | Attending: Emergency Medicine | Admitting: Emergency Medicine

## 2020-04-29 ENCOUNTER — Encounter (HOSPITAL_COMMUNITY): Payer: Self-pay | Admitting: Emergency Medicine

## 2020-04-29 DIAGNOSIS — Z87891 Personal history of nicotine dependence: Secondary | ICD-10-CM | POA: Diagnosis not present

## 2020-04-29 DIAGNOSIS — R197 Diarrhea, unspecified: Secondary | ICD-10-CM | POA: Diagnosis present

## 2020-04-29 DIAGNOSIS — Z79899 Other long term (current) drug therapy: Secondary | ICD-10-CM | POA: Insufficient documentation

## 2020-04-29 DIAGNOSIS — I2510 Atherosclerotic heart disease of native coronary artery without angina pectoris: Secondary | ICD-10-CM | POA: Diagnosis not present

## 2020-04-29 DIAGNOSIS — R110 Nausea: Secondary | ICD-10-CM | POA: Diagnosis not present

## 2020-04-29 DIAGNOSIS — Z951 Presence of aortocoronary bypass graft: Secondary | ICD-10-CM | POA: Diagnosis not present

## 2020-04-29 DIAGNOSIS — I10 Essential (primary) hypertension: Secondary | ICD-10-CM | POA: Insufficient documentation

## 2020-04-29 DIAGNOSIS — R109 Unspecified abdominal pain: Secondary | ICD-10-CM | POA: Diagnosis not present

## 2020-04-29 DIAGNOSIS — E875 Hyperkalemia: Secondary | ICD-10-CM | POA: Insufficient documentation

## 2020-04-29 LAB — COMPREHENSIVE METABOLIC PANEL
ALT: 115 U/L — ABNORMAL HIGH (ref 0–44)
AST: 143 U/L — ABNORMAL HIGH (ref 15–41)
Albumin: 4.2 g/dL (ref 3.5–5.0)
Alkaline Phosphatase: 47 U/L (ref 38–126)
Anion gap: 15 (ref 5–15)
BUN: 25 mg/dL — ABNORMAL HIGH (ref 8–23)
CO2: 23 mmol/L (ref 22–32)
Calcium: 8.8 mg/dL — ABNORMAL LOW (ref 8.9–10.3)
Chloride: 96 mmol/L — ABNORMAL LOW (ref 98–111)
Creatinine, Ser: 1.76 mg/dL — ABNORMAL HIGH (ref 0.61–1.24)
GFR calc Af Amer: 44 mL/min — ABNORMAL LOW (ref >60)
GFR calc non Af Amer: 38 mL/min — ABNORMAL LOW (ref >60)
Glucose, Bld: 118 mg/dL — ABNORMAL HIGH (ref 70–99)
Potassium: 5.6 mmol/L — ABNORMAL HIGH (ref 3.5–5.1)
Sodium: 134 mmol/L — ABNORMAL LOW (ref 135–145)
Total Bilirubin: 1 mg/dL (ref 0.3–1.2)
Total Protein: 8.8 g/dL — ABNORMAL HIGH (ref 6.5–8.1)

## 2020-04-29 LAB — URINALYSIS, ROUTINE W REFLEX MICROSCOPIC
Bilirubin Urine: NEGATIVE
Glucose, UA: NEGATIVE mg/dL
Hgb urine dipstick: NEGATIVE
Ketones, ur: NEGATIVE mg/dL
Leukocytes,Ua: NEGATIVE
Nitrite: NEGATIVE
Protein, ur: NEGATIVE mg/dL
Specific Gravity, Urine: 1.008 (ref 1.005–1.030)
pH: 6 (ref 5.0–8.0)

## 2020-04-29 LAB — CBC WITH DIFFERENTIAL/PLATELET
Abs Immature Granulocytes: 0.01 10*3/uL (ref 0.00–0.07)
Basophils Absolute: 0 10*3/uL (ref 0.0–0.1)
Basophils Relative: 1 %
Eosinophils Absolute: 0 10*3/uL (ref 0.0–0.5)
Eosinophils Relative: 0 %
HCT: 36.3 % — ABNORMAL LOW (ref 39.0–52.0)
Hemoglobin: 12.2 g/dL — ABNORMAL LOW (ref 13.0–17.0)
Immature Granulocytes: 0 %
Lymphocytes Relative: 20 %
Lymphs Abs: 0.9 10*3/uL (ref 0.7–4.0)
MCH: 31.4 pg (ref 26.0–34.0)
MCHC: 33.6 g/dL (ref 30.0–36.0)
MCV: 93.6 fL (ref 80.0–100.0)
Monocytes Absolute: 0.5 10*3/uL (ref 0.1–1.0)
Monocytes Relative: 10 %
Neutro Abs: 3.2 10*3/uL (ref 1.7–7.7)
Neutrophils Relative %: 69 %
Platelets: 101 10*3/uL — ABNORMAL LOW (ref 150–400)
RBC: 3.88 MIL/uL — ABNORMAL LOW (ref 4.22–5.81)
RDW: 12.4 % (ref 11.5–15.5)
WBC: 4.6 10*3/uL (ref 4.0–10.5)
nRBC: 0 % (ref 0.0–0.2)

## 2020-04-29 LAB — I-STAT CHEM 8, ED
BUN: 25 mg/dL — ABNORMAL HIGH (ref 8–23)
Calcium, Ion: 1.04 mmol/L — ABNORMAL LOW (ref 1.15–1.40)
Chloride: 100 mmol/L (ref 98–111)
Creatinine, Ser: 1.9 mg/dL — ABNORMAL HIGH (ref 0.61–1.24)
Glucose, Bld: 105 mg/dL — ABNORMAL HIGH (ref 70–99)
HCT: 38 % — ABNORMAL LOW (ref 39.0–52.0)
Hemoglobin: 12.9 g/dL — ABNORMAL LOW (ref 13.0–17.0)
Potassium: 5.3 mmol/L — ABNORMAL HIGH (ref 3.5–5.1)
Sodium: 135 mmol/L (ref 135–145)
TCO2: 24 mmol/L (ref 22–32)

## 2020-04-29 LAB — ETHANOL: Alcohol, Ethyl (B): 46 mg/dL — ABNORMAL HIGH (ref <10)

## 2020-04-29 LAB — POTASSIUM: Potassium: 6.5 mmol/L (ref 3.5–5.1)

## 2020-04-29 LAB — LIPASE, BLOOD: Lipase: 42 U/L (ref 11–51)

## 2020-04-29 MED ORDER — LORAZEPAM 1 MG PO TABS: 0.0000 mg | ORAL_TABLET | Freq: Two times a day (BID) | ORAL | Status: DC

## 2020-04-29 MED ORDER — AMLODIPINE BESYLATE 10 MG PO TABS: 10.0000 mg | ORAL_TABLET | Freq: Every day | ORAL | 0 refills | Status: DC

## 2020-04-29 MED ORDER — LORAZEPAM 2 MG/ML IJ SOLN: 0.0000 mg | Freq: Two times a day (BID) | INTRAMUSCULAR | Status: DC

## 2020-04-29 MED ORDER — PANTOPRAZOLE SODIUM 20 MG PO TBEC: 20.0000 mg | DELAYED_RELEASE_TABLET | Freq: Two times a day (BID) | ORAL | 0 refills | Status: DC

## 2020-04-29 MED ORDER — LORAZEPAM 1 MG PO TABS: 0.0000 mg | ORAL_TABLET | Freq: Four times a day (QID) | ORAL | Status: DC

## 2020-04-29 MED ORDER — THIAMINE HCL 100 MG/ML IJ SOLN
100.0000 mg | Freq: Every day | INTRAMUSCULAR | Status: DC
  Administered 2020-04-29: 100 mg via INTRAVENOUS
  Filled 2020-04-29: qty 2

## 2020-04-29 MED ORDER — AMLODIPINE BESYLATE 5 MG PO TABS
10.0000 mg | ORAL_TABLET | Freq: Once | ORAL | Status: DC
  Filled 2020-04-29: qty 2

## 2020-04-29 MED ORDER — ONDANSETRON HCL 4 MG/2ML IJ SOLN
4.0000 mg | Freq: Once | INTRAMUSCULAR | Status: AC
  Administered 2020-04-29: 4 mg via INTRAVENOUS
  Filled 2020-04-29: qty 2

## 2020-04-29 MED ORDER — LORAZEPAM 2 MG/ML IJ SOLN
0.0000 mg | Freq: Four times a day (QID) | INTRAMUSCULAR | Status: DC
  Administered 2020-04-29: 1 mg via INTRAVENOUS
  Filled 2020-04-29: qty 1

## 2020-04-29 MED ORDER — ONDANSETRON 4 MG PO TBDP
4.0000 mg | ORAL_TABLET | Freq: Three times a day (TID) | ORAL | 0 refills | Status: DC | PRN
Start: 2020-04-29 — End: 2020-09-03

## 2020-04-29 MED ORDER — THIAMINE HCL 100 MG PO TABS: 100.0000 mg | ORAL_TABLET | Freq: Every day | ORAL | Status: DC

## 2020-04-29 MED ORDER — SODIUM CHLORIDE 0.9 % IV BOLUS
1000.0000 mL | Freq: Once | INTRAVENOUS | Status: AC
  Administered 2020-04-29: 1000 mL via INTRAVENOUS

## 2020-04-29 NOTE — Telephone Encounter (Signed)
Thanks

## 2020-04-29 NOTE — Discharge Instructions (Signed)
Your work-up today did not show any evidence of serious abdominal infection or other abnormalities.  Your kidney function was a little bit abnormal but it is stable compared to other lab work that was done previously.  Your potassium was a little bit high today.  We suspect that your blood pressure medicine could be contributing to this so we are going to stop the losartan and start a blood pressure medicine called amlodipine.  1. Medications: Take Protonix twice daily 1 hour prior to meals.  Take Zofran as needed for nausea and vomiting.  Let this medicine dissolve under your tongue and wait around 10 or 15 minutes before you have anything to eat or drink to give this medicine time to work.  Start taking amlodipine instead of losartan for blood pressure. 2. Treatment: rest, drink plenty of fluids, advance diet slowly.  Eat a diet of bland foods that will not upset your stomach such as crackers, mashed potatoes, and peanut butter. 3. Follow Up: Please followup with your primary doctor within 3 days for discussion of your diagnoses and further evaluation after today's visit and for recheck of your blood pressure. Please return to the ER for persistent vomiting, high fevers or worsening symptoms

## 2020-04-29 NOTE — ED Provider Notes (Signed)
Received patient at signout from [facility].  Refer to provider note for full history and physical examination.  Briefly patient is a 74 year old male with history of alcohol abuse, coronary artery disease, hepatitis, hypertension presenting to the emergency department with complaint of 10 days of diarrhea with associated nausea, decreased appetite and oral intake.  Has some left-sided abdominal pains on examination but no rebound or guarding.  Lab work shows renal insufficiency at patient's baseline.  He is mildly hyperkalemic with potassium of 5.6 but EKG shows normal QT interval, no peak T waves.  Plan for CT scan to rule out acute surgical abdominal pathology and recheck of potassium.  Physical Exam  BP (!) 152/78   Pulse 63   Temp 98.5 F (36.9 C) (Oral)   Resp 18   SpO2 100%   Physical Exam Vitals and nursing note reviewed.  Constitutional:      General: He is not in acute distress.    Appearance: He is well-developed.     Comments: Resting comfortably in bed  HENT:     Head: Normocephalic and atraumatic.  Eyes:     General:        Right eye: No discharge.        Left eye: No discharge.     Conjunctiva/sclera: Conjunctivae normal.  Neck:     Vascular: No JVD.     Trachea: No tracheal deviation.  Cardiovascular:     Rate and Rhythm: Normal rate.  Pulmonary:     Effort: Pulmonary effort is normal.  Abdominal:     General: Bowel sounds are normal. There is no distension.     Palpations: Abdomen is soft.  Skin:    General: Skin is warm and dry.     Findings: No erythema.  Neurological:     Mental Status: He is alert.  Psychiatric:        Behavior: Behavior normal.     ED Course/Procedures     Procedures  MDM  CT ABDOMEN PELVIS WO CONTRAST  Result Date: 04/29/2020 CLINICAL DATA:  Left-sided abdominal pain over the last 10 days. EXAM: CT ABDOMEN AND PELVIS WITHOUT CONTRAST TECHNIQUE: Multidetector CT imaging of the abdomen and pelvis was performed following the  standard protocol without IV contrast. COMPARISON:  02/24/2019 FINDINGS: Lower chest: Mild linear scarring at the lung bases. No active process evident. Hepatobiliary: Liver parenchyma appears unremarkable without contrast. Few insignificant granulomas. There are stones layering dependently in the gallbladder but no evidence of cholecystitis or obstruction by CT. Pancreas: Normal Spleen: Normal Adrenals/Urinary Tract: Adrenal glands are normal. Right kidney is normal. Left kidney again shows the presence of a cyst projecting laterally measuring 5 cm in size. Second cyst projecting inferiorly from the tip of the kidney measures up to 7.4 cm in size. Bladder is normal. Stomach/Bowel: Stomach and small intestine appear normal. No sign of appendicitis or acute bowel inflammation. Vascular/Lymphatic: Aortic atherosclerosis. No aneurysm. IVC is normal. No retroperitoneal adenopathy. Reproductive: Normal Other: Left inguinal hernia containing only fat. Musculoskeletal: Ordinary mild lower lumbar degenerative changes. IMPRESSION: No acute finding to explain left-sided abdominal pain. Chololithiasis without CT evidence of cholecystitis. Left renal cysts as seen previously. Aortic Atherosclerosis (ICD10-I70.0). Small left inguinal hernia containing only fat. Electronically Signed   By: Paulina Fusi M.D.   On: 04/29/2020 15:30   Labs Reviewed  COMPREHENSIVE METABOLIC PANEL - Abnormal; Notable for the following components:      Result Value   Sodium 134 (*)    Potassium 5.6 (*)  Chloride 96 (*)    Glucose, Bld 118 (*)    BUN 25 (*)    Creatinine, Ser 1.76 (*)    Calcium 8.8 (*)    Total Protein 8.8 (*)    AST 143 (*)    ALT 115 (*)    GFR calc non Af Amer 38 (*)    GFR calc Af Amer 44 (*)    All other components within normal limits  ETHANOL - Abnormal; Notable for the following components:   Alcohol, Ethyl (B) 46 (*)    All other components within normal limits  CBC WITH DIFFERENTIAL/PLATELET - Abnormal;  Notable for the following components:   RBC 3.88 (*)    Hemoglobin 12.2 (*)    HCT 36.3 (*)    Platelets 101 (*)    All other components within normal limits  POTASSIUM - Abnormal; Notable for the following components:   Potassium 6.5 (*)    All other components within normal limits  I-STAT CHEM 8, ED - Abnormal; Notable for the following components:   Potassium 5.3 (*)    BUN 25 (*)    Creatinine, Ser 1.90 (*)    Glucose, Bld 105 (*)    Calcium, Ion 1.04 (*)    Hemoglobin 12.9 (*)    HCT 38.0 (*)    All other components within normal limits  GASTROINTESTINAL PANEL BY PCR, STOOL (REPLACES STOOL CULTURE)  C DIFFICILE QUICK SCREEN W PCR REFLEX  LIPASE, BLOOD  URINALYSIS, ROUTINE W REFLEX MICROSCOPIC     CT scan reassuring.  Will recheck potassium.  UA is in process.  With hyperkalemia, will plan to discontinue losartan and start amlodipine instead for blood pressure control and he can follow-up with his PCP for reevaluation.   UA does not suggest UTI or nephrolithiasis.  Repeat potassium unfortunately is hemolyzed, came back elevated at 6.5.  However, I statChem-8 was obtained which showed potassium 5.3.  I suspect this is likely in the setting of dehydration as he has continued to take his losartan.  We discussed discontinuing the losartan and starting amlodipine instead.  Serial abdominal examinations remain benign and he is tolerating p.o. fluids without difficulty.  Discussed symptomatic management with Zofran, Protonix, Imodium as needed for diarrhea.  He will follow up with his PCP for reevaluation of his symptoms and for recheck of his blood pressure.  Discussed strict ED return precautions. Patient verbalized understanding of and agreement with plan and is safe for discharge home at this time. Discussed with Dr. Karle Starch who agrees with assessment and plan at this time.       Renita Papa, PA-C 04/29/20 1933    Truddie Hidden, MD 04/29/20 2042

## 2020-04-29 NOTE — Telephone Encounter (Signed)
Copied from CRM [id number]. Topic: General - Other >> Apr 29, 2020 11:12 AM Dalphine Handing A wrote: Patient complained of feeling weak, unable to eat and that he feels "terrible" and that he will be hanging up to call the ambulance and go to the ER. Was advised by nurse triage for patient to go ahead and call amublance to go to ER.

## 2020-04-29 NOTE — ED Provider Notes (Signed)
[facility]-EMERGENCY DEPT Provider Note   CSN: [id number] Arrival date & time: 04/29/20  1153     History Chief Complaint  Patient presents with  . Abdominal Pain  . Nausea  . Diarrhea    [name] is a 74 y.o. male with past medical history of alcohol abuse, CAD, hepatitis, hypertension, PTSD, presenting to the emergency department with complaint of about 10 days of diarrhea with associated nausea and decreased appetite.  He states he has been having some pain to his left upper abdomen.  He he endorses daily alcohol use, states he drinks 1/2 pint of vodka daily.  Last alcohol was yesterday.  He denies history of seizures with withdrawal though does state he has some hallucinations.  Denies fevers or chills.  He states he has been having a cough productive of phlegm worse in the evenings.  No known Covid contacts.  He has not received Covid vaccine.  No urinary symptoms.  The history is provided by the patient.       Past Medical History:  Diagnosis Date  . Anginal pain (HCC) 01/2005  . Anxiety   . Arthritis    "knees"  . Chronic lower back pain   . Coronary artery disease   . Depression   . Hepatitis    "don't know what kind; they treated me for it; was a long time ago" (12/11/2018)  . High cholesterol   . History of bronchitis    "used to get it alot" (08/25/2012)  . Hypertension   . Panic attacks   . PTSD (post-traumatic stress disorder)    "have been treated in the past" (08/25/2012)    Patient Active Problem List   Diagnosis Date Noted  . Alcohol abuse with alcohol-induced mood disorder (HCC) 06/16/2019  . PTSD (post-traumatic stress disorder) 06/16/2019  . Thrombocytopenia (HCC) 02/24/2019  . Chronic alcohol abuse 12/09/2015  . Coronary artery disease involving coronary bypass graft of native heart without angina pectoris 08/07/2014  . Essential hypertension 08/07/2014  . Hyperlipidemia 08/07/2014    Past Surgical History:  Procedure  Laterality Date  . CARDIAC CATHETERIZATION  01/2005  . CORONARY ARTERY BYPASS GRAFT  01/2005   CABG X3  . Shrapnel Left 1969   LLE; left lateral thumb (required grafting)  . VARICOSE VEIN SURGERY  1980's   LLE       Family History  Problem Relation Age of Onset  . Diabetes Mother   . Heart disease Father   . Cancer Brother     Social History   Tobacco Use  . Smoking status: Former Smoker    Packs/day: 2.00    Years: 5.00    Pack years: 10.00  . Smokeless tobacco: Never Used  . Tobacco comment: 08/25/2012 "quit smoking cigarettes 40 yr ago"  Substance Use Topics  . Alcohol use: Yes    Alcohol/week: 60.0 standard drinks    Types: 60 Standard drinks or equivalent per week    Comment: 12/11/2018 "~ 1 pint of vodka/day"  . Drug use: Yes    Types: Marijuana    Comment: 08/25/2012 "last marijuana 10 years ago"    Home Medications Prior to Admission medications   Medication Sig Start Date End Date Taking? Authorizing Provider  FLUoxetine (PROZAC) 20 MG capsule Take 20 mg by mouth daily.   Yes [provider]  furosemide (LASIX) 20 MG tablet Take 1 tablet by mouth once daily 02/26/20  Yes Lyn Records, MD  losartan (COZAAR) 50 MG  tablet Take 50 mg by mouth daily.   Yes [provider]  metoprolol succinate (TOPROL-XL) 25 MG 24 hr tablet Take 1 tablet (25 mg total) by mouth daily. 05/29/19  Yes Belva Crome, MD  pantoprazole (PROTONIX) 40 MG tablet Take 1 tablet by mouth twice daily Patient taking differently: Take 40 mg by mouth daily.  04/22/20  Yes Sagardia, Ines Bloomer, MD  thiamine (VITAMIN B-1) 100 MG tablet Take 100 mg by mouth daily.   Yes [provider]  albuterol (PROVENTIL HFA;VENTOLIN HFA) 108 (90 Base) MCG/ACT inhaler Inhale 2 puffs into the lungs every 4 (four) hours as needed for up to 30 days for wheezing or shortness of breath. Patient not taking: Reported on 06/15/2019 03/08/19 05/24/19  Madilyn Hook A, PA-C  losartan (COZAAR) 50 MG  tablet Take 1 tablet (50 mg total) by mouth daily. 05/24/19 02/27/20  Belva Crome, MD    Allergies    Codeine and Vicodin [hydrocodone-acetaminophen]  Review of Systems   Review of Systems  All other systems reviewed and are negative.   Physical Exam Updated Vital Signs BP (!) 186/84   Pulse 61   Temp 98.5 F (36.9 C) (Oral)   Resp 19   SpO2 98%   Physical Exam Vitals and nursing note reviewed.  Constitutional:      General: He is not in acute distress.    Appearance: He is well-developed.  HENT:     Head: Normocephalic and atraumatic.  Eyes:     Conjunctiva/sclera: Conjunctivae normal.  Cardiovascular:     Rate and Rhythm: Normal rate and regular rhythm.  Pulmonary:     Effort: Pulmonary effort is normal. No respiratory distress.     Breath sounds: Normal breath sounds.  Abdominal:     General: Bowel sounds are normal.     Palpations: Abdomen is soft.     Tenderness: There is abdominal tenderness in the epigastric area, left upper quadrant and left lower quadrant. There is no guarding or rebound.  Skin:    General: Skin is warm.  Neurological:     Mental Status: He is alert.  Psychiatric:        Behavior: Behavior normal.     ED Results / Procedures / Treatments   Labs (all labs ordered are listed, but only abnormal results are displayed) Labs Reviewed  COMPREHENSIVE METABOLIC PANEL - Abnormal; Notable for the following components:      Result Value   Sodium 134 (*)    Potassium 5.6 (*)    Chloride 96 (*)    Glucose, Bld 118 (*)    BUN 25 (*)    Creatinine, Ser 1.76 (*)    Calcium 8.8 (*)    Total Protein 8.8 (*)    AST 143 (*)    ALT 115 (*)    GFR calc non Af Amer 38 (*)    GFR calc Af Amer 44 (*)    All other components within normal limits  ETHANOL - Abnormal; Notable for the following components:   Alcohol, Ethyl (B) 46 (*)    All other components within normal limits  CBC WITH DIFFERENTIAL/PLATELET - Abnormal; Notable for the following  components:   RBC 3.88 (*)    Hemoglobin 12.2 (*)    HCT 36.3 (*)    Platelets 101 (*)    All other components within normal limits  GASTROINTESTINAL PANEL BY PCR, STOOL (REPLACES STOOL CULTURE)  C DIFFICILE QUICK SCREEN W PCR REFLEX  LIPASE, BLOOD  URINALYSIS, ROUTINE W REFLEX MICROSCOPIC  POTASSIUM    EKG None  Radiology No results found.  Procedures Procedures (including critical care time)  Medications Ordered in ED Medications  LORazepam (ATIVAN) injection 0-4 mg (1 mg Intravenous Given 04/29/20 1528)    Or  LORazepam (ATIVAN) tablet 0-4 mg ( Oral See Alternative 04/29/20 1528)  LORazepam (ATIVAN) injection 0-4 mg (has no administration in time range)    Or  LORazepam (ATIVAN) tablet 0-4 mg (has no administration in time range)  thiamine tablet 100 mg ( Oral See Alternative 04/29/20 1528)    Or  thiamine (B-1) injection 100 mg (100 mg Intravenous Given 04/29/20 1528)  sodium chloride 0.9 % bolus 1,000 mL (0 mLs Intravenous Stopped 04/29/20 1503)  ondansetron (ZOFRAN) injection 4 mg (4 mg Intravenous Given 04/29/20 1256)    ED Course  I have reviewed the triage vital signs and the nursing notes.  Pertinent labs & imaging results that were available during my care of the patient were reviewed by me and considered in my medical decision making (see chart for details).    MDM Rules/Calculators/A&P                      Patient with history of alcohol abuse, presenting with 10 days of diarrhea with associated nausea and left-sided abdominal pain.  He does not appear to be actively withdrawing in the ED though last drink was yesterday.  He endorses drinking about 1/2 pint of vodka per day.  No history of seizure-like activity with alcohol withdrawal.  On exam, patient is in no distress.  Afebrile, hypertensive.  Abdomen is soft with tenderness to left upper and lower quadrant.  Differential diagnosis includes diverticulitis versus colitis versus gastroenteritis.  Plan to obtain  lab work, hydrate with IV fluids, administer antiemetics, and CT scan.  Labs with hyperkalemia 5.6, no acute EKG changes.  Creatinine appears to be at patient's baseline.  No leukocytosis.  Pending CT scan, care assumed at shift change by PA [facility], to follow scan and reevaluate to determine appropriate disposition.  Recheck potassium.  Final Clinical Impression(s) / ED Diagnoses Final diagnoses:  None    Rx / DC Orders ED Discharge Orders    None       [name], [place] N, PA-C 04/29/20 1534    Arby Barrette, MD 05/07/20 1337

## 2020-04-29 NOTE — ED Triage Notes (Signed)
Patient here from home reporting abd pain increased on left upper side. Nausea and diarrhea x10days. Reports that he has not had his COVID vaccinations.

## 2020-04-29 NOTE — Telephone Encounter (Signed)
FYI: Pt called to let us know he's feeling very weak unable to eat and feels "terrible" pt stated they would be calling for an ambulance once they hung up.

## 2020-05-02 ENCOUNTER — Emergency Department (HOSPITAL_COMMUNITY)
Admission: EM | Admit: 2020-05-02 | Discharge: 2020-05-02 | Disposition: A | Discharge status: Home / Self Care | Payer: Medicare Other | Attending: Emergency Medicine | Admitting: Emergency Medicine

## 2020-05-02 ENCOUNTER — Emergency Department (HOSPITAL_COMMUNITY): Payer: Medicare Other

## 2020-05-02 DIAGNOSIS — Z87891 Personal history of nicotine dependence: Secondary | ICD-10-CM | POA: Insufficient documentation

## 2020-05-02 DIAGNOSIS — Z951 Presence of aortocoronary bypass graft: Secondary | ICD-10-CM | POA: Diagnosis not present

## 2020-05-02 DIAGNOSIS — I2510 Atherosclerotic heart disease of native coronary artery without angina pectoris: Secondary | ICD-10-CM | POA: Diagnosis not present

## 2020-05-02 DIAGNOSIS — Z79899 Other long term (current) drug therapy: Secondary | ICD-10-CM | POA: Insufficient documentation

## 2020-05-02 DIAGNOSIS — F1010 Alcohol abuse, uncomplicated: Secondary | ICD-10-CM | POA: Diagnosis not present

## 2020-05-02 DIAGNOSIS — R42 Dizziness and giddiness: Secondary | ICD-10-CM | POA: Insufficient documentation

## 2020-05-02 DIAGNOSIS — I10 Essential (primary) hypertension: Secondary | ICD-10-CM | POA: Diagnosis present

## 2020-05-02 LAB — COMPREHENSIVE METABOLIC PANEL
ALT: 85 U/L — ABNORMAL HIGH (ref 0–44)
AST: 111 U/L — ABNORMAL HIGH (ref 15–41)
Albumin: 3.9 g/dL (ref 3.5–5.0)
Alkaline Phosphatase: 45 U/L (ref 38–126)
Anion gap: 17 — ABNORMAL HIGH (ref 5–15)
BUN: 19 mg/dL (ref 8–23)
CO2: 18 mmol/L — ABNORMAL LOW (ref 22–32)
Calcium: 8.7 mg/dL — ABNORMAL LOW (ref 8.9–10.3)
Chloride: 98 mmol/L (ref 98–111)
Creatinine, Ser: 1.44 mg/dL — ABNORMAL HIGH (ref 0.61–1.24)
GFR calc Af Amer: 56 mL/min — ABNORMAL LOW (ref >60)
GFR calc non Af Amer: 48 mL/min — ABNORMAL LOW (ref >60)
Glucose, Bld: 153 mg/dL — ABNORMAL HIGH (ref 70–99)
Potassium: 3.8 mmol/L (ref 3.5–5.1)
Sodium: 133 mmol/L — ABNORMAL LOW (ref 135–145)
Total Bilirubin: 1.5 mg/dL — ABNORMAL HIGH (ref 0.3–1.2)
Total Protein: 8.7 g/dL — ABNORMAL HIGH (ref 6.5–8.1)

## 2020-05-02 LAB — URINALYSIS, ROUTINE W REFLEX MICROSCOPIC
Bilirubin Urine: NEGATIVE
Glucose, UA: NEGATIVE mg/dL
Hgb urine dipstick: NEGATIVE
Ketones, ur: NEGATIVE mg/dL
Leukocytes,Ua: NEGATIVE
Nitrite: NEGATIVE
Protein, ur: NEGATIVE mg/dL
Specific Gravity, Urine: 1.005 (ref 1.005–1.030)
pH: 6 (ref 5.0–8.0)

## 2020-05-02 LAB — CBC WITH DIFFERENTIAL/PLATELET
Abs Immature Granulocytes: 0.01 10*3/uL (ref 0.00–0.07)
Basophils Absolute: 0 10*3/uL (ref 0.0–0.1)
Basophils Relative: 1 %
Eosinophils Absolute: 0.1 10*3/uL (ref 0.0–0.5)
Eosinophils Relative: 1 %
HCT: 37.6 % — ABNORMAL LOW (ref 39.0–52.0)
Hemoglobin: 12.3 g/dL — ABNORMAL LOW (ref 13.0–17.0)
Immature Granulocytes: 0 %
Lymphocytes Relative: 39 %
Lymphs Abs: 1.9 10*3/uL (ref 0.7–4.0)
MCH: 31.4 pg (ref 26.0–34.0)
MCHC: 32.7 g/dL (ref 30.0–36.0)
MCV: 95.9 fL (ref 80.0–100.0)
Monocytes Absolute: 0.5 10*3/uL (ref 0.1–1.0)
Monocytes Relative: 9 %
Neutro Abs: 2.4 10*3/uL (ref 1.7–7.7)
Neutrophils Relative %: 50 %
Platelets: 87 10*3/uL — ABNORMAL LOW (ref 150–400)
RBC: 3.92 MIL/uL — ABNORMAL LOW (ref 4.22–5.81)
RDW: 12.5 % (ref 11.5–15.5)
WBC: 4.8 10*3/uL (ref 4.0–10.5)
nRBC: 0 % (ref 0.0–0.2)

## 2020-05-02 LAB — RAPID URINE DRUG SCREEN, HOSP PERFORMED
Amphetamines: NOT DETECTED
Barbiturates: NOT DETECTED
Benzodiazepines: NOT DETECTED
Cocaine: NOT DETECTED
Opiates: NOT DETECTED
Tetrahydrocannabinol: POSITIVE — AB

## 2020-05-02 LAB — AMMONIA: Ammonia: 39 umol/L — ABNORMAL HIGH (ref 9–35)

## 2020-05-02 MED ORDER — CHLORDIAZEPOXIDE HCL 25 MG PO CAPS
ORAL_CAPSULE | ORAL | 0 refills | Status: DC
Start: 2020-05-02 — End: 2020-05-06

## 2020-05-02 NOTE — ED Provider Notes (Signed)
[facility] EMERGENCY DEPARTMENT Provider Note   CSN: [id number] Arrival date & time: 05/02/20  4401     History Chief Complaint  Patient presents with  . Hypertension  . Drug / Alcohol Assessment    [name] is a 74 y.o. male.  74 y/o man with hx of alcohol abuse, HTN, anxiety, PTSD, panic attacks presenting to the ER with multiple complaints. Patient reports for the last several days he has been feeling dizzy, having elevated BP and having diarrhea. Reports he was seen at [facility] long 2 days ago for the same and diagnosed with elevated potassium and had his BP meds changed. He reports this morning he had continued symptoms and was concerned that it may be related to pesticides or other chemicals apartment complex is spraying so he called the fire department. He reports when the FD arrived they checked his BP and it was 027O systolic so they told him he needed to go to the ER for concerns of stroke. He demands a "toxicology report" to test for anything that might be poisoning him. He also reports that he is in need of ETOH detox. Reports he was previously drinking 1pint of hard alcohol daily. He weaned himself down to 1/2 pint daily and for the last 2 days he has been having a beer. He reports he thinks his last drink was a beer early this morning.         Past Medical History:  Diagnosis Date  . Anginal pain ([place]) 01/2005  . Anxiety   . Arthritis    "knees"  . Chronic lower back pain   . Coronary artery disease   . Depression   . Hepatitis    "don't know what kind; they treated me for it; was a long time ago" (12/11/2018)  . High cholesterol   . History of bronchitis    "used to get it alot" (08/25/2012)  . Hypertension   . Panic attacks   . PTSD (post-traumatic stress disorder)    "have been treated in the past" (08/25/2012)    Patient Active Problem List   Diagnosis Date Noted  . Alcohol abuse with alcohol-induced mood disorder ([place]) 06/16/2019  .  PTSD (post-traumatic stress disorder) 06/16/2019  . Thrombocytopenia ([place]) 02/24/2019  . Chronic alcohol abuse 12/09/2015  . Coronary artery disease involving coronary bypass graft of native heart without angina pectoris 08/07/2014  . Essential hypertension 08/07/2014  . Hyperlipidemia 08/07/2014    Past Surgical History:  Procedure Laterality Date  . CARDIAC CATHETERIZATION  01/2005  . CORONARY ARTERY BYPASS GRAFT  01/2005   CABG X3  . Shrapnel Left 1969   LLE; left lateral thumb (required grafting)  . VARICOSE VEIN SURGERY  1980's   LLE       Family History  Problem Relation Age of Onset  . Diabetes Mother   . Heart disease Father   . Cancer Brother     Social History   Tobacco Use  . Smoking status: Former Smoker    Packs/day: 2.00    Years: 5.00    Pack years: 10.00  . Smokeless tobacco: Never Used  . Tobacco comment: 08/25/2012 "quit smoking cigarettes 40 yr ago"  Substance Use Topics  . Alcohol use: Yes    Alcohol/week: 60.0 standard drinks    Types: 60 Standard drinks or equivalent per week    Comment: 12/11/2018 "~ 1 pint of vodka/day"  . Drug use: Yes    Types: Marijuana  Comment: 08/25/2012 "last marijuana 10 years ago"    Home Medications Prior to Admission medications   Medication Sig Start Date End Date Taking? Authorizing Provider  amLODipine (NORVASC) 10 MG tablet Take 1 tablet (10 mg total) by mouth daily. 04/29/20  Yes Fawze, Mina A, PA-C  FLUoxetine (PROZAC) 20 MG capsule Take 20 mg by mouth daily.   Yes [provider]  furosemide (LASIX) 20 MG tablet Take 1 tablet by mouth once daily 02/26/20  Yes Lyn Records, MD  metoprolol succinate (TOPROL-XL) 25 MG 24 hr tablet Take 1 tablet (25 mg total) by mouth daily. 05/29/19  Yes Lyn Records, MD  ondansetron (ZOFRAN ODT) 4 MG disintegrating tablet Take 1 tablet (4 mg total) by mouth every 8 (eight) hours as needed for nausea or vomiting. 04/29/20  Yes Fawze, Mina A, PA-C  pantoprazole  (PROTONIX) 20 MG tablet Take 1 tablet (20 mg total) by mouth 2 (two) times daily before a meal. 04/29/20  Yes Fawze, Mina A, PA-C  thiamine (VITAMIN B-1) 100 MG tablet Take 100 mg by mouth daily.   Yes [provider]  albuterol (PROVENTIL HFA;VENTOLIN HFA) 108 (90 Base) MCG/ACT inhaler Inhale 2 puffs into the lungs every 4 (four) hours as needed for up to 30 days for wheezing or shortness of breath. Patient not taking: Reported on 06/15/2019 03/08/19 05/24/19  Ronnie Doss A, PA-C  chlordiazePOXIDE (LIBRIUM) 25 MG capsule 50mg  PO TID x 1D, then 25-50mg  PO BID X 1D, then 25-50mg  PO QD X 1D 05/02/20   07/02/20 A, PA-C  losartan (COZAAR) 50 MG tablet Take 50 mg by mouth daily.  04/29/20  [provider]    Allergies    Codeine and Vicodin [hydrocodone-acetaminophen]  Review of Systems   Review of Systems  Constitutional: Negative for fever.  HENT: Negative for sore throat.   Eyes: Negative for visual disturbance.  Respiratory: Negative for shortness of breath.   Cardiovascular: Negative for chest pain.  Gastrointestinal: Positive for diarrhea.  Genitourinary: Negative for dysuria.  Skin: Negative for rash.  Neurological: Positive for facial asymmetry and light-headedness.  All other systems reviewed and are negative.   Physical Exam Updated Vital Signs BP (!) 148/68   Pulse (!) 51   Resp 16   SpO2 98%   Physical Exam Vitals and nursing note reviewed.  Constitutional:      Appearance: Normal appearance.  HENT:     Head: Normocephalic.     Nose: Nose normal.     Mouth/Throat:     Mouth: Mucous membranes are moist.  Eyes:     Conjunctiva/sclera: Conjunctivae normal.  Cardiovascular:     Rate and Rhythm: Normal rate.  Pulmonary:     Effort: Pulmonary effort is normal.  Skin:    General: Skin is dry.  Neurological:     Mental Status: He is alert and oriented to person, place, and time.  Psychiatric:        Mood and Affect: Mood normal.     ED Results /  Procedures / Treatments   Labs (all labs ordered are listed, but only abnormal results are displayed) Labs Reviewed  COMPREHENSIVE METABOLIC PANEL - Abnormal; Notable for the following components:      Result Value   Sodium 133 (*)    CO2 18 (*)    Glucose, Bld 153 (*)    Creatinine, Ser 1.44 (*)    Calcium 8.7 (*)    Total Protein 8.7 (*)    AST  111 (*)    ALT 85 (*)    Total Bilirubin 1.5 (*)    GFR calc non Af Amer 48 (*)    GFR calc Af Amer 56 (*)    Anion gap 17 (*)    All other components within normal limits  CBC WITH DIFFERENTIAL/PLATELET - Abnormal; Notable for the following components:   RBC 3.92 (*)    Hemoglobin 12.3 (*)    HCT 37.6 (*)    Platelets 87 (*)    All other components within normal limits  AMMONIA - Abnormal; Notable for the following components:   Ammonia 39 (*)    All other components within normal limits  RAPID URINE DRUG SCREEN, HOSP PERFORMED - Abnormal; Notable for the following components:   Tetrahydrocannabinol POSITIVE (*)    All other components within normal limits  URINALYSIS, ROUTINE W REFLEX MICROSCOPIC    EKG EKG Interpretation  Date/Time:  Friday May 02 2020 09:07:38 EDT Ventricular Rate:  62 PR Interval:    QRS Duration: 121 QT Interval:  449 QTC Calculation: 456 R Axis:   39 Text Interpretation: Sinus rhythm Borderline prolonged PR interval Nonspecific intraventricular conduction delay When compared to prior, similar appearance. No STEMI Confirmed by Theda Belfast ([id number]) on 05/02/2020 9:12:57 AM   Radiology CT Head Wo Contrast  Result Date: 05/02/2020 CLINICAL DATA:  Headache EXAM: CT HEAD WITHOUT CONTRAST TECHNIQUE: Contiguous axial images were obtained from the base of the skull through the vertex without intravenous contrast. COMPARISON:  February 18, 2019 FINDINGS: Brain: There is age related volume loss. There is no intracranial mass, hemorrhage, extra-axial fluid collection, or midline shift. There is mild small vessel  disease in the centra semiovale bilaterally, stable. No acute infarct evident. Vascular: No hyperdense vessel. Foci of calcification noted in each carotid siphon region. Skull: Bony calvarium appears intact. Sinuses/Orbits: There are old healed nasal fractures bilaterally, more notable on the right. There is mild mucosal thickening in several ethmoid air cells. Other visualized paranasal sinuses are clear. Orbits appear symmetric bilaterally. Other: Mastoid air cells are clear. IMPRESSION: Stable age related volume loss with mild periventricular small vessel disease. No acute infarct. No mass or hemorrhage. Multiple foci of arterial vascular calcification noted. Mucosal thickening in several ethmoid air cells. Prior healed nasal fractures. Electronically Signed   By: Bretta Bang III M.D.   On: 05/02/2020 11:06    Procedures Procedures (including critical care time)  Medications Ordered in ED Medications - No data to display  ED Course  I have reviewed the triage vital signs and the nursing notes.  Pertinent labs & imaging results that were available during my care of the patient were reviewed by me and considered in my medical decision making (see chart for details).  Clinical Course as of May 02 1356  Fri May 02, 2020  8811 After speaking with the patient I explained the plan to the patient. I explained that we would be happy to work up his dizziness/diarrhea today but that this hospital does not offer detox and that we cannot performed the extensive toxicology report for "all chemicals" that he requested in the ER. I explained the plan to check labs, possible imaging and contact CM/SW for detox options. He then stated he did not feel safe here and states we couldn't help him and was further upset that he had pain with the IV placement. He proceeded to call family members to come the ER to get him. I again tried to comfort the patient  and explain our plan to assess him. He requested to speak  with the "MD in charge". I contacted Dr. Rush Landmark to see the patient at this time.    [KM]  1345 Patient work-up was overall reassuring and was actually improved from a few days ago.  I discussed with the patient that we will start him on a Librium taper and he can follow-up with his primary care doctor.  Patient was reassured and he is happy with the plan.  Patient is feeling improved.   [KM]    Clinical Course User Index [KM] Jeral Pinch   MDM Rules/Calculators/A&P                      Based on review of vitals, medical screening exam, lab work and/or imaging, there does not appear to be an acute, emergent etiology for the patient's symptoms. Counseled pt on good return precautions and encouraged both PCP and ED follow-up as needed.  Prior to discharge, I also discussed incidental imaging findings with patient in detail and advised appropriate, recommended follow-up in detail.  Clinical Impression: 1. Dizziness   2. Alcohol abuse   3. Hypertension, unspecified type     Disposition: Discharge  Prior to providing a prescription for a controlled substance, I independently reviewed the patient's recent prescription history on the [place] Controlled Substance Reporting System. The patient had no recent or regular prescriptions and was deemed appropriate for a brief, less than 3 day prescription of narcotic for acute analgesia.  This note was prepared with assistance of Conservation officer, historic buildings. Occasional wrong-word or sound-a-like substitutions may have occurred due to the inherent limitations of voice recognition software.  Final Clinical Impression(s) / ED Diagnoses Final diagnoses:  Dizziness  Alcohol abuse  Hypertension, unspecified type    Rx / DC Orders ED Discharge Orders         Ordered    chlordiazePOXIDE (LIBRIUM) 25 MG capsule     05/02/20 1355           Jeral Pinch 05/02/20 1357    Tegeler, Canary Brim, MD 05/03/20  [phone]

## 2020-05-02 NOTE — ED Triage Notes (Signed)
Pt here with hypertension, dizziness and near syncope. Reports detoxing off of etoh. Last drink 2 days ago. Started back on metoprolol 2 days ago. No neuro deficits noted. Pt requesting toxicology testing.  176/98 HR 60 98% RA

## 2020-05-02 NOTE — Discharge Instructions (Addendum)
Thank you for allowing me to care for you today. Please return to the emergency department if you have new or worsening symptoms. Take your medications as instructed.  ° °

## 2020-05-02 NOTE — ED Notes (Signed)
Patient verbalizes understanding of discharge instructions. Opportunity for questioning and answers were provided. Pt discharged from ED. 

## 2020-05-02 NOTE — ED Notes (Signed)
[name] would like an update [phone]

## 2020-05-06 ENCOUNTER — Other Ambulatory Visit: Payer: Self-pay

## 2020-05-06 ENCOUNTER — Ambulatory Visit (INDEPENDENT_AMBULATORY_CARE_PROVIDER_SITE_OTHER): Payer: Medicare Other | Admitting: Emergency Medicine

## 2020-05-06 ENCOUNTER — Encounter: Payer: Self-pay | Admitting: Emergency Medicine

## 2020-05-06 VITALS — BP 157/67 | HR 59 | Temp 98.1°F | Resp 16 | Ht 71.0 in | Wt 238.0 lb

## 2020-05-06 DIAGNOSIS — F1010 Alcohol abuse, uncomplicated: Secondary | ICD-10-CM | POA: Diagnosis not present

## 2020-05-06 DIAGNOSIS — Z8679 Personal history of other diseases of the circulatory system: Secondary | ICD-10-CM

## 2020-05-06 DIAGNOSIS — F4310 Post-traumatic stress disorder, unspecified: Secondary | ICD-10-CM

## 2020-05-06 DIAGNOSIS — Z9289 Personal history of other medical treatment: Secondary | ICD-10-CM

## 2020-05-06 DIAGNOSIS — R05 Cough: Secondary | ICD-10-CM

## 2020-05-06 DIAGNOSIS — I110 Hypertensive heart disease with heart failure: Secondary | ICD-10-CM | POA: Diagnosis not present

## 2020-05-06 DIAGNOSIS — Z09 Encounter for follow-up examination after completed treatment for conditions other than malignant neoplasm: Secondary | ICD-10-CM

## 2020-05-06 DIAGNOSIS — I5032 Chronic diastolic (congestive) heart failure: Secondary | ICD-10-CM | POA: Diagnosis not present

## 2020-05-06 DIAGNOSIS — R059 Cough, unspecified: Secondary | ICD-10-CM

## 2020-05-06 LAB — COMPREHENSIVE METABOLIC PANEL
ALT: 63 IU/L — ABNORMAL HIGH (ref 0–44)
AST: 57 IU/L — ABNORMAL HIGH (ref 0–40)
Albumin/Globulin Ratio: 1 — ABNORMAL LOW (ref 1.2–2.2)
Albumin: 4.2 g/dL (ref 3.7–4.7)
Alkaline Phosphatase: 57 IU/L (ref 48–121)
BUN/Creatinine Ratio: 15 (ref 10–24)
BUN: 19 mg/dL (ref 8–27)
Bilirubin Total: 0.8 mg/dL (ref 0.0–1.2)
CO2: 20 mmol/L (ref 20–29)
Calcium: 9.3 mg/dL (ref 8.6–10.2)
Chloride: 104 mmol/L (ref 96–106)
Creatinine, Ser: 1.28 mg/dL — ABNORMAL HIGH (ref 0.76–1.27)
GFR calc Af Amer: 64 mL/min/{1.73_m2} (ref >59)
GFR calc non Af Amer: 56 mL/min/{1.73_m2} — ABNORMAL LOW (ref >59)
Globulin, Total: 4.1 g/dL (ref 1.5–4.5)
Glucose: 96 mg/dL (ref 65–99)
Potassium: 4.3 mmol/L (ref 3.5–5.2)
Sodium: 136 mmol/L (ref 134–144)
Total Protein: 8.3 g/dL (ref 6.0–8.5)

## 2020-05-06 MED ORDER — CHLORDIAZEPOXIDE HCL 25 MG PO CAPS: 25.0000 mg | ORAL_CAPSULE | Freq: Two times a day (BID) | ORAL | 0 refills | Status: DC | PRN

## 2020-05-06 NOTE — Patient Instructions (Addendum)
If you have lab work done today you will be contacted with your lab results within the next 2 weeks.  If you have not heard from [place] then please contact us. The fastest way to get your results is to register for My Chart.   IF you received an x-ray today, you will receive an invoice from [facility]. Please contact [facility] at [phone] with questions or concerns regarding your invoice.   IF you received labwork today, you will receive an invoice from [facility]. Please contact [facility] at [phone] with questions or concerns regarding your invoice.   Our billing staff will not be able to assist you with questions regarding bills from these companies.  You will be contacted with the lab results as soon as they are available. The fastest way to get your results is to activate your My Chart account. Instructions are located on the last page of this paperwork. If you have not heard from [place] regarding the results in 2 weeks, please contact this office.     Alcohol Abuse and Dependence Information, Adult Alcohol is a widely available drug. People drink alcohol in different amounts. People who drink alcohol very often and in large amounts often have problems during and after drinking. They may develop what is called an alcohol use disorder. There are two main types of alcohol use disorders:  Alcohol abuse. This is when you use alcohol too much or too often. You may use alcohol to make yourself feel happy or to reduce stress. You may have a hard time setting a limit on the amount you drink.  Alcohol dependence. This is when you use alcohol consistently for a period of time, and your body changes as a result. This can make it hard to stop drinking because you may start to feel sick or feel different when you do not use alcohol. These symptoms are known as withdrawal. How can alcohol abuse and dependence affect me? Alcohol abuse and dependence can have a negative effect on  your life. Drinking too much can lead to addiction. You may feel like you need alcohol to function normally. You may drink alcohol before work in the morning, during the day, or as soon as you get home from work in the evening. These actions can result in:  Poor work performance.  Job loss.  Financial problems.  Car crashes or criminal charges from driving after drinking alcohol.  Problems in your relationships with friends and family.  Losing the trust and respect of coworkers, friends, and family. Drinking heavily over a long period of time can permanently damage your body and brain, and can cause lifelong health issues, such as:  Damage to your liver or pancreas.  Heart problems, high blood pressure, or stroke.  Certain cancers.  Decreased ability to fight infections.  Brain or nerve damage.  Depression.  Early (premature) death. If you are careless or you crave alcohol, it is easy to drink more than your body can handle (overdose). Alcohol overdose is a serious situation that requires hospitalization. It may lead to permanent injuries or death. What can increase my risk?  Having a family history of alcohol abuse.  Having depression or other mental health conditions.  Beginning to drink at an early age.  Binge drinking often.  Experiencing trauma, stress, and an unstable home life during childhood.  Spending time with people who drink often. What actions can I take to prevent or manage alcohol abuse and dependence?  Do not  drink alcohol if: ? Your health care provider tells you not to drink. ? You are pregnant, may be pregnant, or are planning to become pregnant.  If you drink alcohol: ? Limit how much you use to:  0-1 drink a day for women.  0-2 drinks a day for men. ? Be aware of how much alcohol is in your drink. In the [place] one drink equals one 12 oz bottle of beer (355 mL), one 5 oz glass of wine (148 mL), or one 1 oz glass of hard liquor (44  mL).  Stop drinking if you have been drinking too much. This can be very hard to do if you are used to abusing alcohol. If you begin to have withdrawal symptoms, talk with your health care provider or a person that you trust. These symptoms may include anxiety, shaky hands, headache, nausea, sweating, or not being able to sleep.  Choose to drink nonalcoholic beverages in social gatherings and places where there may be alcohol. Activity  Spend more time on activities that you enjoy that do not involve alcohol, like hobbies or exercise.  Find healthy ways to cope with stress, such as exercise, meditation, or spending time with people you care about. General information  Talk to your family, coworkers, and friends about supporting you in your efforts to stop drinking. If they drink, ask them not to drink around you. Spend more time with people who do not drink alcohol.  If you think that you have an alcohol dependency problem: ? Tell friends or family about your concerns. ? Talk with your health care provider or another health professional about where to get help. ? Work with a Paramedic and a Network engineer. ? Consider joining a support group for people who struggle with alcohol abuse and dependence. Where to find support   Your health care provider.  SMART Recovery: www.smartrecovery.org Therapy and support groups  Local treatment centers or chemical dependency counselors.  Local AA groups in your community: SalaryStart.tn Where to find more information  Centers for Disease Control and Prevention: FootballExhibition.com.br  [employer] on Alcohol Abuse and Alcoholism: BasicStudents.dk  Alcoholics Anonymous (AA): SalaryStart.tn Contact a health care provider if:  You drank more or for longer than you intended on more than one occasion.  You tried to stop drinking or to cut back on how much you drink, but you were not able to.  You often drink to the point of vomiting or  passing out.  You want to drink so badly that you cannot think about anything else.  You have problems in your life due to drinking, but you continue to drink.  You keep drinking even though you feel anxious, depressed, or have experienced memory loss.  You have stopped doing the things you used to enjoy in order to drink.  You have to drink more than you used to in order to get the effect you want.  You experience anxiety, sweating, nausea, shakiness, and trouble sleeping when you try to stop drinking. Get help right away if:  You have thoughts about hurting yourself or others.  You have serious withdrawal symptoms, including: ? Confusion. ? Racing heart. ? High blood pressure. ? Fever. If you ever feel like you may hurt yourself or others, or have thoughts about taking your own life, get help right away. You can go to your nearest emergency department or call:  Your local emergency services (911 in the [place]  A suicide crisis helpline, such as the  National Suicide Prevention Lifeline at (908)664-4238. This is open 24 hours a day. Summary  Alcohol abuse and dependence can have a negative effect on your life. Drinking too much or too often can lead to addiction.  If you drink alcohol, limit how much you use.  If you are having trouble keeping your drinking under control, find ways to change your behavior. Hobbies, calming activities, exercise, or support groups can help.  If you feel you need help with changing your drinking habits, talk with your health care provider, a good friend, or a therapist, or go to an AA group. This information is not intended to replace advice given to you by your health care provider. Make sure you discuss any questions you have with your health care provider. Document Revised: 03/06/2019 Document Reviewed: 01/23/2019 Elsevier Patient Education  2020 Elsevier Inc.  Hypertension, Adult High blood pressure (hypertension) is when the force of  blood pumping through the arteries is too strong. The arteries are the blood vessels that carry blood from the heart throughout the body. Hypertension forces the heart to work harder to pump blood and may cause arteries to become narrow or stiff. Untreated or uncontrolled hypertension can cause a heart attack, heart failure, a stroke, kidney disease, and other problems. A blood pressure reading consists of a higher number over a lower number. Ideally, your blood pressure should be below 120/80. The first ("top") number is called the systolic pressure. It is a measure of the pressure in your arteries as your heart beats. The second ("bottom") number is called the diastolic pressure. It is a measure of the pressure in your arteries as the heart relaxes. What are the causes? The exact cause of this condition is not known. There are some conditions that result in or are related to high blood pressure. What increases the risk? Some risk factors for high blood pressure are under your control. The following factors may make you more likely to develop this condition:  Smoking.  Having type 2 diabetes mellitus, high cholesterol, or both.  Not getting enough exercise or physical activity.  Being overweight.  Having too much fat, sugar, calories, or salt (sodium) in your diet.  Drinking too much alcohol. Some risk factors for high blood pressure may be difficult or impossible to change. Some of these factors include:  Having chronic kidney disease.  Having a family history of high blood pressure.  Age. Risk increases with age.  Race. You may be at higher risk if you are African American.  Gender. Men are at higher risk than women before age 34. After age 66, women are at higher risk than men.  Having obstructive sleep apnea.  Stress. What are the signs or symptoms? High blood pressure may not cause symptoms. Very high blood pressure (hypertensive crisis) may  cause:  Headache.  Anxiety.  Shortness of breath.  Nosebleed.  Nausea and vomiting.  Vision changes.  Severe chest pain.  Seizures. How is this diagnosed? This condition is diagnosed by measuring your blood pressure while you are seated, with your arm resting on a flat surface, your legs uncrossed, and your feet flat on the floor. The cuff of the blood pressure monitor will be placed directly against the skin of your upper arm at the level of your heart. It should be measured at least twice using the same arm. Certain conditions can cause a difference in blood pressure between your right and left arms. Certain factors can cause blood pressure readings to be  lower or higher than normal for a short period of time:  When your blood pressure is higher when you are in a health care provider's office than when you are at home, this is called white coat hypertension. Most people with this condition do not need medicines.  When your blood pressure is higher at home than when you are in a health care provider's office, this is called masked hypertension. Most people with this condition may need medicines to control blood pressure. If you have a high blood pressure reading during one visit or you have normal blood pressure with other risk factors, you may be asked to:  Return on a different day to have your blood pressure checked again.  Monitor your blood pressure at home for 1 week or longer. If you are diagnosed with hypertension, you may have other blood or imaging tests to help your health care provider understand your overall risk for other conditions. How is this treated? This condition is treated by making healthy lifestyle changes, such as eating healthy foods, exercising more, and reducing your alcohol intake. Your health care provider may prescribe medicine if lifestyle changes are not enough to get your blood pressure under control, and if:  Your systolic blood pressure is above  130.  Your diastolic blood pressure is above 80. Your personal target blood pressure may vary depending on your medical conditions, your age, and other factors. Follow these instructions at home: Eating and drinking   Eat a diet that is high in fiber and potassium, and low in sodium, added sugar, and fat. An example eating plan is called the DASH (Dietary Approaches to Stop Hypertension) diet. To eat this way: ? Eat plenty of fresh fruits and vegetables. Try to fill one half of your plate at each meal with fruits and vegetables. ? Eat whole grains, such as whole-wheat pasta, brown rice, or whole-grain bread. Fill about one fourth of your plate with whole grains. ? Eat or drink low-fat dairy products, such as skim milk or low-fat yogurt. ? Avoid fatty cuts of meat, processed or cured meats, and poultry with skin. Fill about one fourth of your plate with lean proteins, such as fish, chicken without skin, beans, eggs, or tofu. ? Avoid pre-made and processed foods. These tend to be higher in sodium, added sugar, and fat.  Reduce your daily sodium intake. Most people with hypertension should eat less than 1,500 mg of sodium a day.  Do not drink alcohol if: ? Your health care provider tells you not to drink. ? You are pregnant, may be pregnant, or are planning to become pregnant.  If you drink alcohol: ? Limit how much you use to:  0-1 drink a day for women.  0-2 drinks a day for men. ? Be aware of how much alcohol is in your drink. In the [place] one drink equals one 12 oz bottle of beer (355 mL), one 5 oz glass of wine (148 mL), or one 1 oz glass of hard liquor (44 mL). Lifestyle   Work with your health care provider to maintain a healthy body weight or to lose weight. Ask what an ideal weight is for you.  Get at least 30 minutes of exercise most days of the week. Activities may include walking, swimming, or biking.  Include exercise to strengthen your muscles (resistance exercise),  such as Pilates or lifting weights, as part of your weekly exercise routine. Try to do these types of exercises for 30 minutes at least  3 days a week.  Do not use any products that contain nicotine or tobacco, such as cigarettes, e-cigarettes, and chewing tobacco. If you need help quitting, ask your health care provider.  Monitor your blood pressure at home as told by your health care provider.  Keep all follow-up visits as told by your health care provider. This is important. Medicines  Take over-the-counter and prescription medicines only as told by your health care provider. Follow directions carefully. Blood pressure medicines must be taken as prescribed.  Do not skip doses of blood pressure medicine. Doing this puts you at risk for problems and can make the medicine less effective.  Ask your health care provider about side effects or reactions to medicines that you should watch for. Contact a health care provider if you:  Think you are having a reaction to a medicine you are taking.  Have headaches that keep coming back (recurring).  Feel dizzy.  Have swelling in your ankles.  Have trouble with your vision. Get help right away if you:  Develop a severe headache or confusion.  Have unusual weakness or numbness.  Feel faint.  Have severe pain in your chest or abdomen.  Vomit repeatedly.  Have trouble breathing. Summary  Hypertension is when the force of blood pumping through your arteries is too strong. If this condition is not controlled, it may put you at risk for serious complications.  Your personal target blood pressure may vary depending on your medical conditions, your age, and other factors. For most people, a normal blood pressure is less than 120/80.  Hypertension is treated with lifestyle changes, medicines, or a combination of both. Lifestyle changes include losing weight, eating a healthy, low-sodium diet, exercising more, and limiting alcohol. This  information is not intended to replace advice given to you by your health care provider. Make sure you discuss any questions you have with your health care provider. Document Revised: 07/26/2018 Document Reviewed: 07/26/2018 Elsevier Patient Education  2020 [employer]

## 2020-05-06 NOTE — Addendum Note (Signed)
Addended by: Georg Ruddle A on: 05/06/2020 12:39 PM   Modules accepted: Orders

## 2020-05-06 NOTE — Progress Notes (Signed)
Ronald BonusJoseph M [name] 74 y.o.   Chief Complaint  Patient presents with  . Alcohol Intoxication    Hospital follow up 05/02/2020 - per pt was told to follow up with PCP  . Dizziness    HISTORY OF PRESENT ILLNESS: This is [name] 74 y.o. male here for hospital follow-up.  Seen in the emergency department on 05/02/2020 for dizziness, uncontrolled hypertension and alcohol abuse.  Was released on Librium for alcohol withdrawals symptoms. Today feels better.  Has no complaints or medical concerns.  Sees psychiatrist regularly for PTSD.  Emergency department visit on 05/02/2020 summary as follows: Ronald SciaraJoseph M [name] is [name] 74 y.o. male with [name] past medical history significant for hypertension, anxiety, alcohol abuse, and PTSD who presents with blood pressure fluctuations, concern for chemical exposure, and some right-sided transient numbness.  Patient reports that he has been diagnosed with some electrolyte imbalances several days ago when he was seen for similar symptoms.  He reports that he has continued to have some headaches and thinks he has been exposed to chemicals in his home.  He reportedly called back to diet and [name] diet near his home was clean and clear but they found him to have [name] blood pressure of around 200 so they sent him to the emergency department for evaluation.  He was reporting some very mild headache as well as having some transient right arm and right leg tingling sensation.  No weakness reported.  He denied new cough, congestion, chest pain, or abdominal pain on my history gathering.  He had diarrhea recently and has electrolyte imbalances.  He also reports that he is trying to quit drinking alcohol but has never had any alcohol withdrawal seizures.  He reports he did drink alcohol this morning.  On exam, lungs are clear and chest is nontender.  Abdomen is nontender.  No focal neurologic deficits on my full exam.  Normal visual fields, normal extraocular movements, clear speech.  Patient very  well-appearing.  Blood pressure was improved into the 160s on my evaluation.  Given the fluctuating blood pressures including fire department finding blood pressure over 200, we agreed to get imaging of his head to look for abnormality given the transient headache and right arm and right leg symptoms.  Low suspicion for hemorrhage or stroke at this time.  Patient is headache free on my evaluation.  We will also get some lab work to look for electrolyte imbalances and evidence of organ injury.  We informed him that we do not have acute ability to do certain chemical testing he was requesting but he can follow-up with his PCP and talk to the health department for this.  Work-up was reassuring and patient is improved and symptoms.  Patient has no other complaints and will be discharged for outpatient follow-up.  Patient interested in withdraw assistance with alcohol detox and was given [name] prescription for Librium taper.  Patient discharged in good condition.   HPI   Prior to Admission medications   Medication Sig Start Date End Date Taking? Authorizing Provider  chlordiazePOXIDE (LIBRIUM) 25 MG capsule 50mg  PO TID x 1D, then 25-50mg  PO BID X 1D, then 25-50mg  PO QD X 1D 05/02/20  Yes [name]  FLUoxetine (PROZAC) 20 MG capsule Take 20 mg by mouth daily.   Yes [provider]  metoprolol succinate (TOPROL-XL) 25 MG 24 hr tablet Take 1 tablet (25 mg total) by mouth daily. 05/29/19  Yes Ronald RecordsSmith, Ronald W, MD  pantoprazole (PROTONIX) 20 MG tablet  Take 1 tablet (20 mg total) by mouth 2 (two) times daily before [name] meal. 04/29/20  Yes [name], [name], [name]  albuterol (PROVENTIL HFA;VENTOLIN HFA) 108 (90 Base) MCG/ACT inhaler Inhale 2 puffs into the lungs every 4 (four) hours as needed for up to 30 days for wheezing or shortness of breath. Patient not taking: Reported on 06/15/2019 03/08/19 05/24/19  Ronald Hook [name], [name]  amLODipine (NORVASC) 10 MG tablet Take 1 tablet (10 mg total) by mouth  daily. Patient not taking: Reported on 05/06/2020 04/29/20   Ronald Perna [name], [name]  furosemide (LASIX) 20 MG tablet Take 1 tablet by mouth once daily Patient not taking: Reported on 05/06/2020 02/26/20   Ronald Crome, MD  ondansetron (ZOFRAN ODT) 4 MG disintegrating tablet Take 1 tablet (4 mg total) by mouth every 8 (eight) hours as needed for nausea or vomiting. Patient not taking: Reported on 05/06/2020 04/29/20   Ronald Perna [name], [name]  thiamine (VITAMIN B-1) 100 MG tablet Take 100 mg by mouth daily.    [provider]  losartan (COZAAR) 50 MG tablet Take 50 mg by mouth daily.  04/29/20  [provider]    Allergies  Allergen Reactions  . Codeine Palpitations  . Vicodin [Hydrocodone-Acetaminophen] Nausea And Vomiting, Swelling and Palpitations    Patient Active Problem List   Diagnosis Date Noted  . Alcohol abuse with alcohol-induced mood disorder ([place]) 06/16/2019  . PTSD (post-traumatic stress disorder) 06/16/2019  . Thrombocytopenia ([place]) 02/24/2019  . Chronic alcohol abuse 12/09/2015  . Coronary artery disease involving coronary bypass graft of native heart without angina pectoris 08/07/2014  . Essential hypertension 08/07/2014  . Hyperlipidemia 08/07/2014    Past Medical History:  Diagnosis Date  . Anginal pain ([place]) 01/2005  . Anxiety   . Arthritis    "knees"  . Chronic lower back pain   . Coronary artery disease   . Depression   . Hepatitis    "don't know what kind; they treated me for it; was [name] long time ago" (12/11/2018)  . High cholesterol   . History of bronchitis    "used to get it alot" (08/25/2012)  . Hypertension   . Panic attacks   . PTSD (post-traumatic stress disorder)    "have been treated in the past" (08/25/2012)    Past Surgical History:  Procedure Laterality Date  . CARDIAC CATHETERIZATION  01/2005  . CORONARY ARTERY BYPASS GRAFT  01/2005   CABG X3  . Shrapnel Left 1969   LLE; left lateral thumb (required grafting)  . VARICOSE VEIN SURGERY   1980's   LLE    Social History   Socioeconomic History  . Marital status: Divorced    Spouse name: Not on file  . Number of children: Not on file  . Years of education: Not on file  . Highest education level: Not on file  Occupational History  . Not on file  Tobacco Use  . Smoking status: Former Smoker    Packs/day: 2.00    Years: 5.00    Pack years: 10.00  . Smokeless tobacco: Never Used  . Tobacco comment: 08/25/2012 "quit smoking cigarettes 40 yr ago"  Substance and Sexual Activity  . Alcohol use: Yes    Alcohol/week: 60.0 standard drinks    Types: 60 Standard drinks or equivalent per week    Comment: 12/11/2018 "~ 1 pint of vodka/day"  . Drug use: Yes    Types: Marijuana    Comment: 08/25/2012 "last marijuana 10 years ago"  .  Sexual activity: Yes  Other Topics Concern  . Not on file  Social History Narrative  . Not on file   Social Determinants of Health   Financial Resource Strain:   . Difficulty of Paying Living Expenses:   Food Insecurity:   . Worried About Programme researcher, broadcasting/film/video in the Last Year:   . Barista in the Last Year:   Transportation Needs:   . Freight forwarder (Medical):   Marland Kitchen Lack of Transportation (Non-Medical):   Physical Activity:   . Days of Exercise per Week:   . Minutes of Exercise per Session:   Stress:   . Feeling of Stress :   Social Connections:   . Frequency of Communication with Friends and Family:   . Frequency of Social Gatherings with Friends and Family:   . Attends Religious Services:   . Active Member of Clubs or Organizations:   . Attends Banker Meetings:   Marland Kitchen Marital Status:   Intimate Partner Violence:   . Fear of Current or Ex-Partner:   . Emotionally Abused:   Marland Kitchen Physically Abused:   . Sexually Abused:     Family History  Problem Relation Age of Onset  . Diabetes Mother   . Heart disease Father   . Cancer Brother      Review of Systems  Constitutional: Negative.  Negative for chills and  fever.  HENT: Negative.  Negative for congestion and sore throat.   Respiratory: Negative.  Negative for cough and shortness of breath.   Cardiovascular: Negative.  Negative for chest pain and palpitations.  Gastrointestinal: Negative.  Negative for abdominal pain, diarrhea, nausea and vomiting.  Genitourinary: Negative.   Skin: Negative.  Negative for rash.  Neurological: Negative.  Negative for dizziness and headaches.  All other systems reviewed and are negative.    Physical Exam Vitals reviewed.  Constitutional:      Appearance: Normal appearance.  HENT:     Head: Normocephalic.  Eyes:     Extraocular Movements: Extraocular movements intact.     Conjunctiva/sclera: Conjunctivae normal.     Pupils: Pupils are equal, round, and reactive to light.  Cardiovascular:     Rate and Rhythm: Normal rate and regular rhythm.     Pulses: Normal pulses.     Heart sounds: Normal heart sounds.  Pulmonary:     Effort: Pulmonary effort is normal.     Breath sounds: Normal breath sounds.  Abdominal:     Palpations: Abdomen is soft.     Tenderness: There is no abdominal tenderness.  Musculoskeletal:        General: Normal range of motion.     Cervical back: Normal range of motion and neck supple.  Skin:    General: Skin is warm and dry.     Capillary Refill: Capillary refill takes less than 2 seconds.  Neurological:     General: No focal deficit present.     Mental Status: He is alert and oriented to person, place, and time.  Psychiatric:        Mood and Affect: Mood normal.        Behavior: Behavior normal.    [name] total of 30 minutes was spent with the patient, greater than 50% of which was in counseling/coordination of care regarding chronic medical problems, review of recent ED visit and discharge summary, review of most recent office visit notes, review of most recent blood work results, review of all medications, alcohol abuse and cardiovascular  risks associated with this, need for  detox program, diet and nutrition as well as increasing physical activity, need to follow-up with his psychiatrist for PTSD and alcohol abuse, prognosis and need for follow-up.   ASSESSMENT & PLAN: Majed was seen today for alcohol intoxication and dizziness.  Diagnoses and all orders for this visit:  Hypertensive heart disease with chronic diastolic congestive heart failure (HCC) -     Comprehensive metabolic panel  History of coronary artery disease  Chronic alcohol abuse  PTSD (post-traumatic stress disorder)  S/P alcohol detoxification -     chlordiazePOXIDE (LIBRIUM) 25 MG capsule; Take 1 capsule (25 mg total) by mouth 2 (two) times daily as needed for anxiety.  Hospital discharge follow-up    Patient Instructions       If you have lab work done today you will be contacted with your lab results within the next 2 weeks.  If you have not heard from [place] then please contact us. The fastest way to get your results is to register for My Chart.   IF you received an x-ray today, you will receive an invoice from [facility]. Please contact [facility] at [phone] with questions or concerns regarding your invoice.   IF you received labwork today, you will receive an invoice from [facility]. Please contact [facility] at [phone] with questions or concerns regarding your invoice.   Our billing staff will not be able to assist you with questions regarding bills from these companies.  You will be contacted with the lab results as soon as they are available. The fastest way to get your results is to activate your My Chart account. Instructions are located on the last page of this paperwork. If you have not heard from [place] regarding the results in 2 weeks, please contact this office.     Alcohol Abuse and Dependence Information, Adult Alcohol is [name] widely available drug. People drink alcohol in different amounts. People who drink alcohol very often and in large  amounts often have problems during and after drinking. They may develop what is called an alcohol use disorder. There are two main types of alcohol use disorders:  Alcohol abuse. This is when you use alcohol too much or too often. You may use alcohol to make yourself feel happy or to reduce stress. You may have [name] hard time setting [name] limit on the amount you drink.  Alcohol dependence. This is when you use alcohol consistently for [name] period of time, and your body changes as [name] result. This can make it hard to stop drinking because you may start to feel sick or feel different when you do not use alcohol. These symptoms are known as withdrawal. How can alcohol abuse and dependence affect me? Alcohol abuse and dependence can have [name] negative effect on your life. Drinking too much can lead to addiction. You may feel like you need alcohol to function normally. You may drink alcohol before work in the morning, during the day, or as soon as you get home from work in the evening. These actions can result in:  Poor work performance.  Job loss.  Financial problems.  Car crashes or criminal charges from driving after drinking alcohol.  Problems in your relationships with friends and family.  Losing the trust and respect of coworkers, friends, and family. Drinking heavily over [name] long period of time can permanently damage your body and brain, and can cause lifelong health issues, such as:  Damage to your liver or pancreas.  Heart problems, high blood pressure, or stroke.  Certain cancers.  Decreased ability to fight infections.  Brain or nerve damage.  Depression.  Early (premature) death. If you are careless or you crave alcohol, it is easy to drink more than your body can handle (overdose). Alcohol overdose is [name] serious situation that requires hospitalization. It may lead to permanent injuries or death. What can increase my risk?  Having [name] family history of alcohol abuse.  Having depression or  other mental health conditions.  Beginning to drink at an early age.  Binge drinking often.  Experiencing trauma, stress, and an unstable home life during childhood.  Spending time with people who drink often. What actions can I take to prevent or manage alcohol abuse and dependence?  Do not drink alcohol if: ? Your health care provider tells you not to drink. ? You are pregnant, may be pregnant, or are planning to become pregnant.  If you drink alcohol: ? Limit how much you use to:  0-1 drink [name] day for women.  0-2 drinks [name] day for men. ? Be aware of how much alcohol is in your drink. In the [place] one drink equals one 12 oz bottle of beer (355 mL), one 5 oz glass of wine (148 mL), or one 1 oz glass of hard liquor (44 mL).  Stop drinking if you have been drinking too much. This can be very hard to do if you are used to abusing alcohol. If you begin to have withdrawal symptoms, talk with your health care provider or [name] person that you trust. These symptoms may include anxiety, shaky hands, headache, nausea, sweating, or not being able to sleep.  Choose to drink nonalcoholic beverages in social gatherings and places where there may be alcohol. Activity  Spend more time on activities that you enjoy that do not involve alcohol, like hobbies or exercise.  Find healthy ways to cope with stress, such as exercise, meditation, or spending time with people you care about. General information  Talk to your family, coworkers, and friends about supporting you in your efforts to stop drinking. If they drink, ask them not to drink around you. Spend more time with people who do not drink alcohol.  If you think that you have an alcohol dependency problem: ? Tell friends or family about your concerns. ? Talk with your health care provider or another health professional about where to get help. ? Work with [name] Paramedic and [name] Network engineer. ? Consider joining [name] support group for  people who struggle with alcohol abuse and dependence. Where to find support   Your health care provider.  SMART Recovery: www.smartrecovery.org Therapy and support groups  Local treatment centers or chemical dependency counselors.  Local AA groups in your community: SalaryStart.tn Where to find more information  Centers for Disease Control and Prevention: FootballExhibition.com.br  [employer] on Alcohol Abuse and Alcoholism: BasicStudents.dk  Alcoholics Anonymous (AA): SalaryStart.tn Contact [name] health care provider if:  You drank more or for longer than you intended on more than one occasion.  You tried to stop drinking or to cut back on how much you drink, but you were not able to.  You often drink to the point of vomiting or passing out.  You want to drink so badly that you cannot think about anything else.  You have problems in your life due to drinking, but you continue to drink.  You keep drinking even though you feel anxious, depressed, or have experienced memory  loss.  You have stopped doing the things you used to enjoy in order to drink.  You have to drink more than you used to in order to get the effect you want.  You experience anxiety, sweating, nausea, shakiness, and trouble sleeping when you try to stop drinking. Get help right away if:  You have thoughts about hurting yourself or others.  You have serious withdrawal symptoms, including: ? Confusion. ? Racing heart. ? High blood pressure. ? Fever. If you ever feel like you may hurt yourself or others, or have thoughts about taking your own life, get help right away. You can go to your nearest emergency department or call:  Your local emergency services (911 in the [place]  [name] suicide crisis helpline, such as the National Suicide Prevention Lifeline at 229-217-7729. This is open 24 hours [name] day. Summary  Alcohol abuse and dependence can have [name] negative effect on your life. Drinking too much or too often can lead to  addiction.  If you drink alcohol, limit how much you use.  If you are having trouble keeping your drinking under control, find ways to change your behavior. Hobbies, calming activities, exercise, or support groups can help.  If you feel you need help with changing your drinking habits, talk with your health care provider, [name] good friend, or [name] therapist, or go to an AA group. This information is not intended to replace advice given to you by your health care provider. Make sure you discuss any questions you have with your health care provider. Document Revised: 03/06/2019 Document Reviewed: 01/23/2019 Elsevier Patient Education  2020 Elsevier Inc.  Hypertension, Adult High blood pressure (hypertension) is when the force of blood pumping through the arteries is too strong. The arteries are the blood vessels that carry blood from the heart throughout the body. Hypertension forces the heart to work harder to pump blood and may cause arteries to become narrow or stiff. Untreated or uncontrolled hypertension can cause [name] heart attack, heart failure, [name] stroke, kidney disease, and other problems. [name] blood pressure reading consists of [name] higher number over [name] lower number. Ideally, your blood pressure should be below 120/80. The first ("top") number is called the systolic pressure. It is [name] measure of the pressure in your arteries as your heart beats. The second ("bottom") number is called the diastolic pressure. It is [name] measure of the pressure in your arteries as the heart relaxes. What are the causes? The exact cause of this condition is not known. There are some conditions that result in or are related to high blood pressure. What increases the risk? Some risk factors for high blood pressure are under your control. The following factors may make you more likely to develop this condition:  Smoking.  Having type 2 diabetes mellitus, high cholesterol, or both.  Not getting enough exercise or physical  activity.  Being overweight.  Having too much fat, sugar, calories, or salt (sodium) in your diet.  Drinking too much alcohol. Some risk factors for high blood pressure may be difficult or impossible to change. Some of these factors include:  Having chronic kidney disease.  Having [name] family history of high blood pressure.  Age. Risk increases with age.  Race. You may be at higher risk if you are African American.  Gender. Men are at higher risk than women before age 56. After age 8, women are at higher risk than men.  Having obstructive sleep apnea.  Stress. What are the signs or symptoms?  High blood pressure may not cause symptoms. Very high blood pressure (hypertensive crisis) may cause:  Headache.  Anxiety.  Shortness of breath.  Nosebleed.  Nausea and vomiting.  Vision changes.  Severe chest pain.  Seizures. How is this diagnosed? This condition is diagnosed by measuring your blood pressure while you are seated, with your arm resting on [name] flat surface, your legs uncrossed, and your feet flat on the floor. The cuff of the blood pressure monitor will be placed directly against the skin of your upper arm at the level of your heart. It should be measured at least twice using the same arm. Certain conditions can cause [name] difference in blood pressure between your right and left arms. Certain factors can cause blood pressure readings to be lower or higher than normal for [name] short period of time:  When your blood pressure is higher when you are in [name] health care provider's office than when you are at home, this is called white coat hypertension. Most people with this condition do not need medicines.  When your blood pressure is higher at home than when you are in [name] health care provider's office, this is called masked hypertension. Most people with this condition may need medicines to control blood pressure. If you have [name] high blood pressure reading during one visit or you have  normal blood pressure with other risk factors, you may be asked to:  Return on [name] different day to have your blood pressure checked again.  Monitor your blood pressure at home for 1 week or longer. If you are diagnosed with hypertension, you may have other blood or imaging tests to help your health care provider understand your overall risk for other conditions. How is this treated? This condition is treated by making healthy lifestyle changes, such as eating healthy foods, exercising more, and reducing your alcohol intake. Your health care provider may prescribe medicine if lifestyle changes are not enough to get your blood pressure under control, and if:  Your systolic blood pressure is above 130.  Your diastolic blood pressure is above 80. Your personal target blood pressure may vary depending on your medical conditions, your age, and other factors. Follow these instructions at home: Eating and drinking   Eat [name] diet that is high in fiber and potassium, and low in sodium, added sugar, and fat. An example eating plan is called the DASH (Dietary Approaches to Stop Hypertension) diet. To eat this way: ? Eat plenty of fresh fruits and vegetables. Try to fill one half of your plate at each meal with fruits and vegetables. ? Eat whole grains, such as whole-wheat pasta, brown rice, or whole-grain bread. Fill about one fourth of your plate with whole grains. ? Eat or drink low-fat dairy products, such as skim milk or low-fat yogurt. ? Avoid fatty cuts of meat, processed or cured meats, and poultry with skin. Fill about one fourth of your plate with lean proteins, such as fish, chicken without skin, beans, eggs, or tofu. ? Avoid pre-made and processed foods. These tend to be higher in sodium, added sugar, and fat.  Reduce your daily sodium intake. Most people with hypertension should eat less than 1,500 mg of sodium [name] day.  Do not drink alcohol if: ? Your health care provider tells you not to  drink. ? You are pregnant, may be pregnant, or are planning to become pregnant.  If you drink alcohol: ? Limit how much you use to:  0-1 drink [name] day for  women.  0-2 drinks [name] day for men. ? Be aware of how much alcohol is in your drink. In the [place] one drink equals one 12 oz bottle of beer (355 mL), one 5 oz glass of wine (148 mL), or one 1 oz glass of hard liquor (44 mL). Lifestyle   Work with your health care provider to maintain [name] healthy body weight or to lose weight. Ask what an ideal weight is for you.  Get at least 30 minutes of exercise most days of the week. Activities may include walking, swimming, or biking.  Include exercise to strengthen your muscles (resistance exercise), such as Pilates or lifting weights, as part of your weekly exercise routine. Try to do these types of exercises for 30 minutes at least 3 days [name] week.  Do not use any products that contain nicotine or tobacco, such as cigarettes, e-cigarettes, and chewing tobacco. If you need help quitting, ask your health care provider.  Monitor your blood pressure at home as told by your health care provider.  Keep all follow-up visits as told by your health care provider. This is important. Medicines  Take over-the-counter and prescription medicines only as told by your health care provider. Follow directions carefully. Blood pressure medicines must be taken as prescribed.  Do not skip doses of blood pressure medicine. Doing this puts you at risk for problems and can make the medicine less effective.  Ask your health care provider about side effects or reactions to medicines that you should watch for. Contact [name] health care provider if you:  Think you are having [name] reaction to [name] medicine you are taking.  Have headaches that keep coming back (recurring).  Feel dizzy.  Have swelling in your ankles.  Have trouble with your vision. Get help right away if you:  Develop [name] severe headache or confusion.  Have  unusual weakness or numbness.  Feel faint.  Have severe pain in your chest or abdomen.  Vomit repeatedly.  Have trouble breathing. Summary  Hypertension is when the force of blood pumping through your arteries is too strong. If this condition is not controlled, it may put you at risk for serious complications.  Your personal target blood pressure may vary depending on your medical conditions, your age, and other factors. For most people, [name] normal blood pressure is less than 120/80.  Hypertension is treated with lifestyle changes, medicines, or [name] combination of both. Lifestyle changes include losing weight, eating [name] healthy, low-sodium diet, exercising more, and limiting alcohol. This information is not intended to replace advice given to you by your health care provider. Make sure you discuss any questions you have with your health care provider. Document Revised: 07/26/2018 Document Reviewed: 07/26/2018 Elsevier Patient Education  2020 Elsevier Inc.       Edwina Barth, MD [facility]

## 2020-05-07 ENCOUNTER — Encounter: Payer: Self-pay | Admitting: Emergency Medicine

## 2020-05-07 LAB — SAR COV2 SEROLOGY (COVID19)AB(IGG),IA: DiaSorin SARS-CoV-2 Ab, IgG: NEGATIVE

## 2020-05-09 ENCOUNTER — Telehealth: Payer: Self-pay

## 2020-05-09 NOTE — Telephone Encounter (Signed)
Pt. Called requesting specific information about his lab results. Pt. Inquired if the value of his liver enzymes went down. If possible could a provider look over his recent labs and have someone call him with the answer?

## 2020-05-12 NOTE — Telephone Encounter (Signed)
Dr Alvy Bimler, patient's do not look it has been reviewed by you yet. Is there anything

## 2020-05-13 ENCOUNTER — Telehealth: Payer: Self-pay | Admitting: Emergency Medicine

## 2020-05-13 NOTE — Telephone Encounter (Signed)
Pt is calling and would like blood work result 

## 2020-05-13 NOTE — Telephone Encounter (Signed)
Pt called and is wanting liver enzymes results from labs that were done on 05/06/20. Pt wants to know if they are going down because he is on  Librium. Pt would like a call about these. [phone] Please advise.

## 2020-05-13 NOTE — Telephone Encounter (Signed)
Pt is requesting Lab results. Please advise

## 2020-05-13 NOTE — Telephone Encounter (Signed)
Dr Alvy Bimler sent him a message via mychart on June 9th. Please relay message to patient if he has not seen it yet. thanks

## 2020-05-14 ENCOUNTER — Telehealth: Payer: Self-pay | Admitting: *Deleted

## 2020-05-14 NOTE — Telephone Encounter (Signed)
Called patient to let him know Dr Alvy Bimler sent him a my chart message about his lab results on 05/07/2020. I was unable to leave a message because mobile mail box is full.

## 2020-05-14 NOTE — Telephone Encounter (Signed)
Spoke to patient to let him know Dr Alvy Bimler sent a my chart message on 05/07/2020 to let him know the labs looked pretty good. The patient did not check his my chart message. I reviewed the ALT and AST results because he asked about the liver numbers and he was happy the numbers were down from the last time. Patient was very thankful and he stated he will continue to do what he was doing. He is exercising and walking.

## 2020-05-27 ENCOUNTER — Other Ambulatory Visit: Payer: Self-pay | Admitting: Interventional Cardiology

## 2020-05-29 ENCOUNTER — Ambulatory Visit (INDEPENDENT_AMBULATORY_CARE_PROVIDER_SITE_OTHER): Payer: Medicare Other | Admitting: Family Medicine

## 2020-05-29 ENCOUNTER — Encounter: Payer: Self-pay | Admitting: Family Medicine

## 2020-05-29 ENCOUNTER — Other Ambulatory Visit: Payer: Self-pay

## 2020-05-29 VITALS — BP 151/71 | HR 60 | Temp 98.0°F | Ht 71.0 in | Wt 236.0 lb

## 2020-05-29 DIAGNOSIS — R059 Cough, unspecified: Secondary | ICD-10-CM

## 2020-05-29 DIAGNOSIS — T7840XA Allergy, unspecified, initial encounter: Secondary | ICD-10-CM | POA: Diagnosis not present

## 2020-05-29 DIAGNOSIS — R05 Cough: Secondary | ICD-10-CM

## 2020-05-29 DIAGNOSIS — Z77120 Contact with and (suspected) exposure to mold (toxic): Secondary | ICD-10-CM

## 2020-05-29 NOTE — Progress Notes (Signed)
Patient ID: [name], male    DOB: [birth year]  Age: 74 y.o. MRN: [id number]  Chief Complaint  Patient presents with  . Cough    Pt stated that he has been coughing for the past 41mos/52yr and he ttok his son to the dr. for coughing and they told him that his son had a mold allergy that was causing him to cough so this is why he is here to get a check up and make sure he didnt not have the same thing.    Subjective:   Patient is here with a chronic low-grade cough.  It bothers him when he lays down at night.  This been going on for a while.  He does not smoke.  No one smokes in his apartment.  His son has been diagnosed as having a cough from mold allergy.  When the man came to clean the filters it was filthy.  The filter apparatus was also filthy so when the filters changed there is still a lot of black stuff around.  He says his floors get moist from humidity.  He feels like his cough is from the mold.  He denies major other allergies.  Current allergies, medications, problem list, past/family and social histories reviewed.  Objective:  BP (!) 151/71   Pulse 60   Temp 98 F (36.7 C) (Temporal)   Ht 5\' 11"  (1.803 m)   Wt 236 lb (107 kg)   SpO2 96%   BMI 32.92 kg/m  No acute distress.  Alert and oriented.  Mild systolic elevation.  Chest clear.  Heart rate without murmurs.  Throat clear.  He already takes some medicine for reflux.  Assessment & Plan:   Assessment: 1. Allergy, initial encounter   2. Cough   3. Mold exposure       Plan: See instructions.  I do not know what is causing his cough, but we will send him onto the allergist per his request so he can be evaluated for possible mold allergy.  Orders Placed This Encounter  Procedures  . Ambulatory referral to Allergy    Referral Priority:   Routine    Referral Type:   Allergy Testing    Referral Reason:   Specialty Services Required    Requested Specialty:   Allergy    Number of Visits Requested:   1    No  orders of the defined types were placed in this encounter.        Patient Instructions    Referral has been made to Dr. .  You should hear from that referral during the next week.  Try taking an over-the-counter antihistamine every night such as Claritin or Allegra (loratadine or fexofenadine)  Continue taking your stomach acid suppression medication  Return as needed   If you have lab work done today you will be contacted with your lab results within the next 2 weeks.  If you have not heard from Hoyle Barr then please contact us. The fastest way to get your results is to register for My Chart.   IF you received an x-ray today, you will receive an invoice from [facility]. Please contact [facility] at [phone] with questions or concerns regarding your invoice.   IF you received labwork today, you will receive an invoice from [place]. Please contact [facility] at [phone] with questions or concerns regarding your invoice.   Our billing staff will not be able to assist you with questions regarding bills from these companies.  You will be contacted with the lab results as soon as they are available. The fastest way to get your results is to activate your My Chart account. Instructions are located on the last page of this paperwork. If you have not heard from [place] regarding the results in 2 weeks, please contact this office.        Return if symptoms worsen or fail to improve.   Janace Hoard, MD 05/29/2020

## 2020-05-29 NOTE — Patient Instructions (Addendum)
  Referral has been made to Dr. Hoyle Barr.  You should hear from that referral during the next week.  Try taking an over-the-counter antihistamine every night such as Claritin or Allegra (loratadine or fexofenadine)  Continue taking your stomach acid suppression medication  Return as needed   If you have lab work done today you will be contacted with your lab results within the next 2 weeks.  If you have not heard from [place] then please contact us. The fastest way to get your results is to register for My Chart.   IF you received an x-ray today, you will receive an invoice from [facility]. Please contact [facility] at [phone] with questions or concerns regarding your invoice.   IF you received labwork today, you will receive an invoice from [place]. Please contact [facility] at [phone] with questions or concerns regarding your invoice.   Our billing staff will not be able to assist you with questions regarding bills from these companies.  You will be contacted with the lab results as soon as they are available. The fastest way to get your results is to activate your My Chart account. Instructions are located on the last page of this paperwork. If you have not heard from [place] regarding the results in 2 weeks, please contact this office.

## 2020-06-18 ENCOUNTER — Telehealth: Payer: Self-pay | Admitting: Interventional Cardiology

## 2020-06-18 NOTE — Telephone Encounter (Signed)
Patient calling to speak with Dr. Michaelle Copas nurse. I asked if I could take a message and have her call him back and he said no, he just would like a call back from her.

## 2020-06-18 NOTE — Telephone Encounter (Signed)
Pt states Urologist called in Viagra for him and it is expensive.  Pharmacist told him if he has pulmonary HTN, insurance will help cover it if they can get a letter stating he has this diagnoses.  Advised pt he doesn't have pulmonary HTN, just essential HTN.  Mentioned [facility] in [place] and [facility] are usually more cost effective.  Pt will reach out to Urology to send prescription elsewhere.  Pt appreciative for call.

## 2020-06-22 IMAGING — CT CT ABDOMEN AND PELVIS WITH CONTRAST
2 of 5 series · 16 of 46 positions shown, 18 images · IV contrast (Omni 300)
Comparison: 02/17/2014

CLINICAL DATA: GI bleed, dark stools

EXAM:
CT ABDOMEN AND PELVIS WITH CONTRAST
TECHNIQUE: Multidetector CT imaging of the abdomen and pelvis was performed
using the standard protocol following bolus administration of
intravenous contrast.
CONTRAST:  100mL OMNIPAQUE IOHEXOL 300 MG/ML  SOLN

[Series 3: a/p w/ 5mm · axial · 0.93mm/px · z∈[+728,+1228]mm · 13 of 114 slices shown, 15 images]
[im 7/114  soft-tissue]
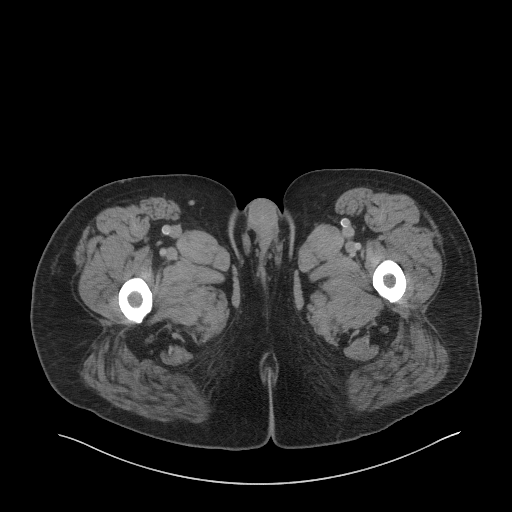
[im 7/114  bone]
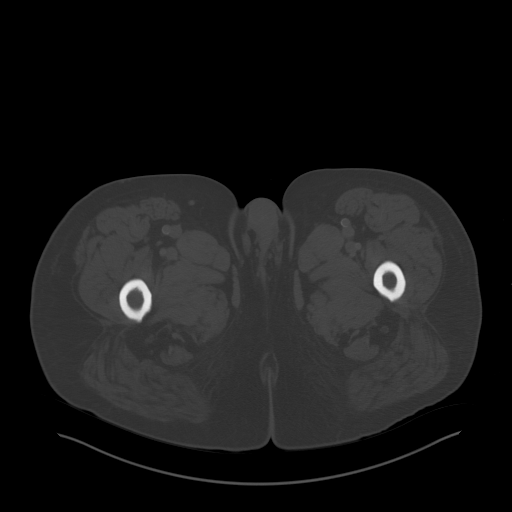
[im 13/114  soft-tissue]
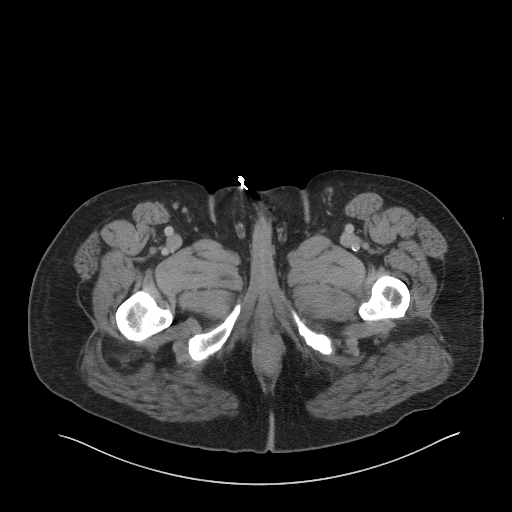
[im 26/114  soft-tissue]
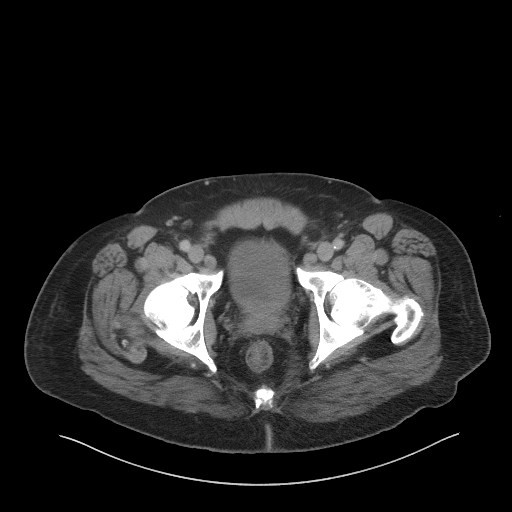
[im 32/114  soft-tissue]
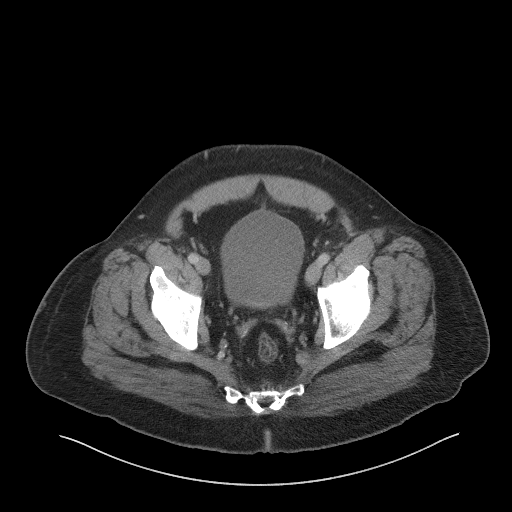
[im 38/114  soft-tissue]
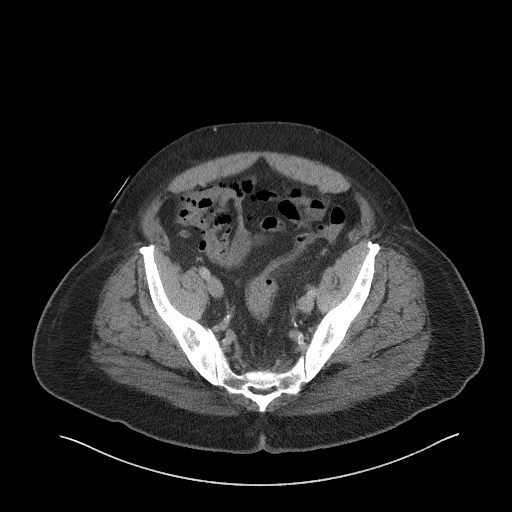
[im 51/114  soft-tissue]
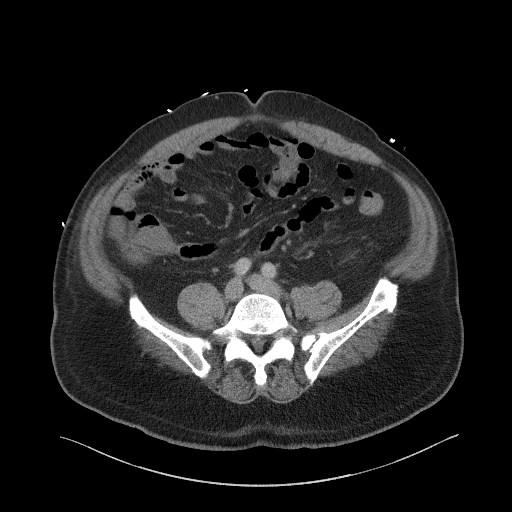
[im 57/114  soft-tissue]
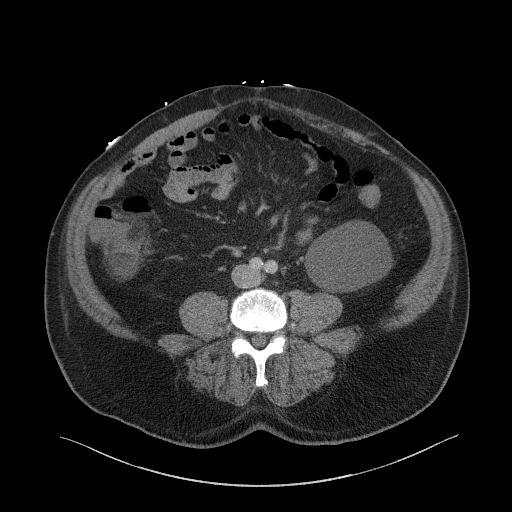
[im 63/114  soft-tissue]
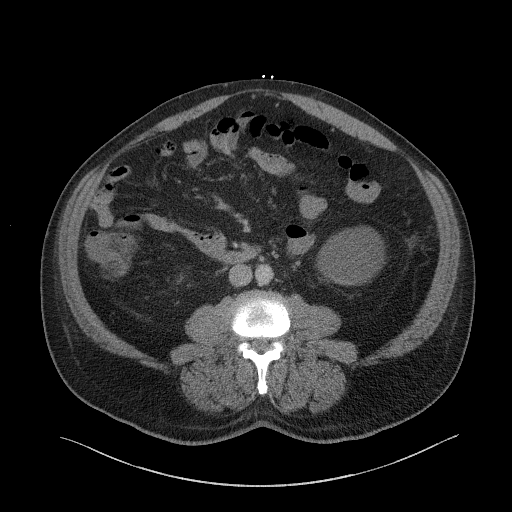
[im 76/114  soft-tissue]
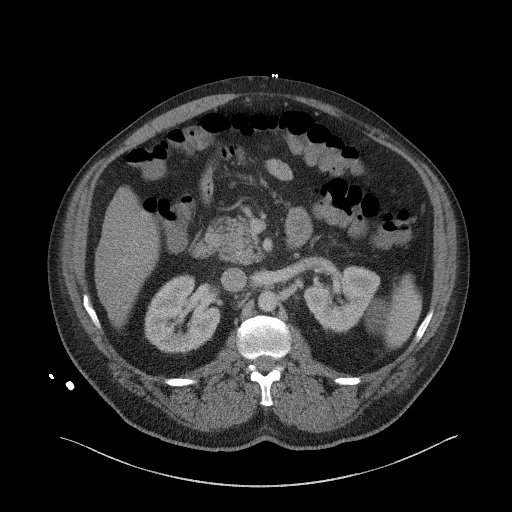
[im 76/114  bone]
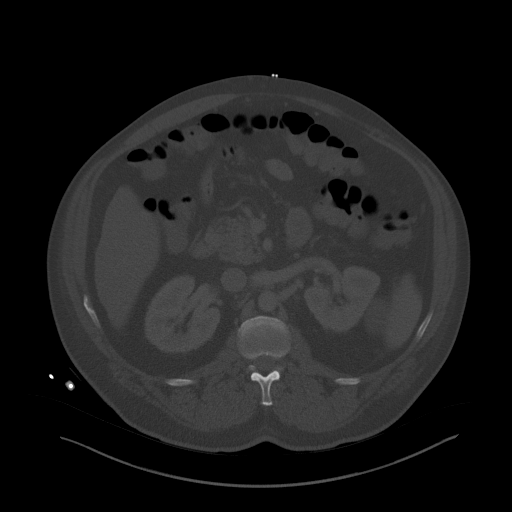
[im 82/114  soft-tissue]
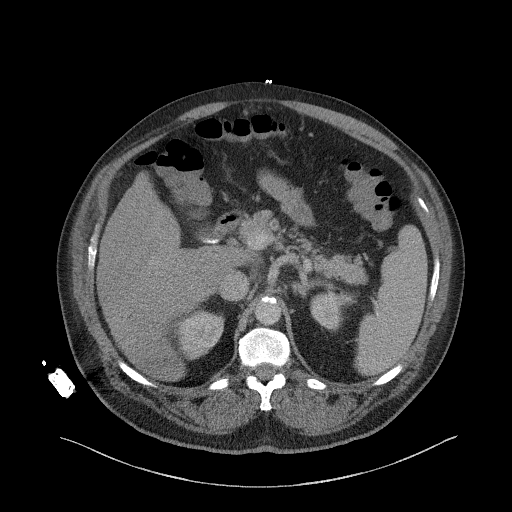
[im 88/114  soft-tissue]
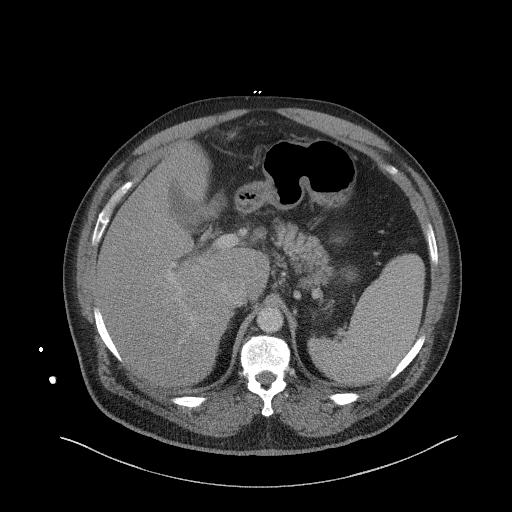
[im 101/114  soft-tissue]
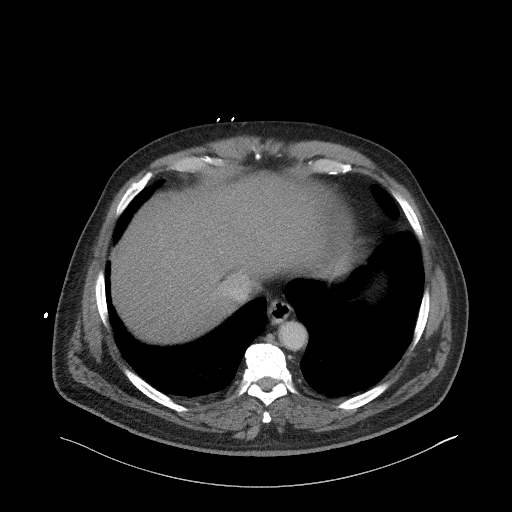
[im 107/114  soft-tissue]
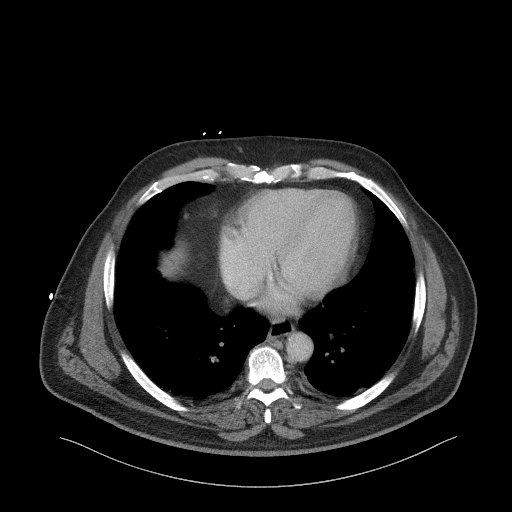

[Series 6: a/p w/ cor · coronal · 0.87mm/px · 3 of 151 slices shown]
[im 51/151  soft-tissue]
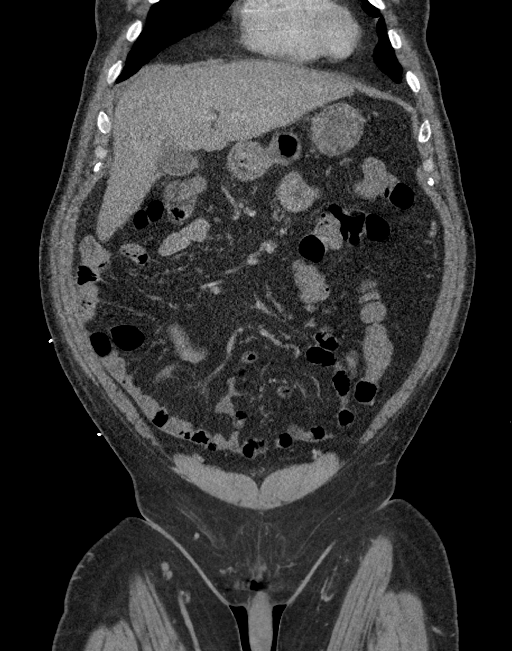
[im 67/151  soft-tissue]
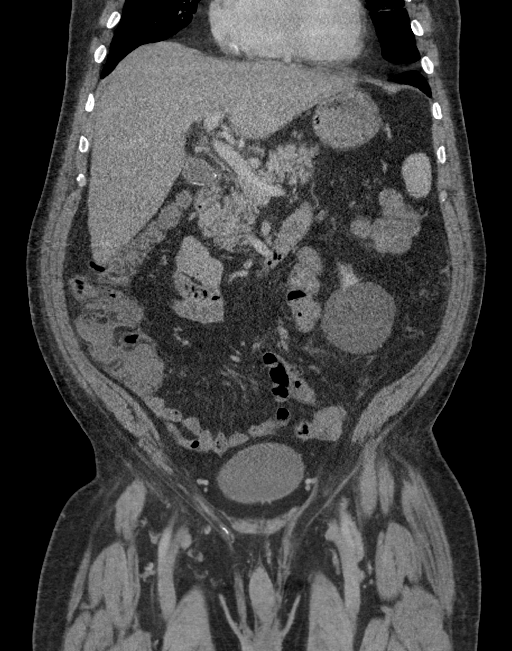
[im 84/151  soft-tissue]
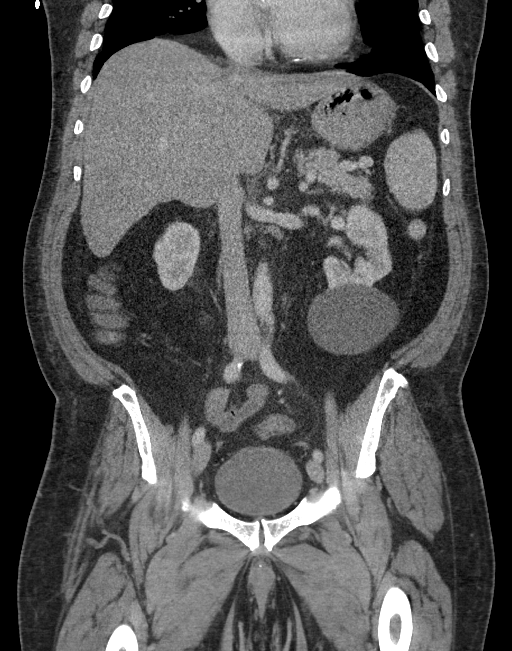

[16 of 46 positions shown; findings below may reference images not displayed]

FINDINGS: Lower chest: Bibasilar scarring or atelectasis.

Hepatobiliary: No focal liver abnormality is seen. Small gallstones.
No gallbladder wall thickening, or biliary dilatation.

Pancreas: Unremarkable. No pancreatic ductal dilatation or
surrounding inflammatory changes.

Spleen: Normal in size without focal abnormality.

Adrenals/Urinary Tract: Adrenal glands are unremarkable. Exophytic
left renal cysts. Kidneys are normal, without renal calculi, focal
lesion, or hydronephrosis. Bladder is unremarkable.

Stomach/Bowel: Stomach is within normal limits. Appendix appears
normal. Fatty mural stratification of the rectum. Occasional sigmoid
diverticula.

Vascular/Lymphatic: No significant vascular findings are present. No
enlarged abdominal or pelvic lymph nodes.

Reproductive: No mass or other abnormality.

Other: Small fat containing left inguinal hernia. No abdominopelvic
ascites.

Musculoskeletal: No acute or significant osseous findings.
IMPRESSION: 1. No definite CT findings to explain GI bleeding. There are
occasional sigmoid diverticula and fatty mural stratification of the
rectum, suggestive of prior or chronic inflammation.

2.  Chronic and incidental findings as detailed above.

## 2020-06-30 ENCOUNTER — Telehealth: Payer: Self-pay | Admitting: Interventional Cardiology

## 2020-06-30 ENCOUNTER — Other Ambulatory Visit: Payer: Self-pay | Admitting: Interventional Cardiology

## 2020-06-30 MED ORDER — METOPROLOL SUCCINATE ER 25 MG PO TB24: 25.0000 mg | ORAL_TABLET | Freq: Every day | ORAL | 0 refills | Status: DC

## 2020-06-30 NOTE — Telephone Encounter (Signed)
*  STAT* If patient is at the pharmacy, call can be transferred to refill team.   1. Which medications need to be refilled? (please list name of each medication and dose if known) metoprolol succinate (TOPROL-XL) 25 MG 24 hr tablet  2. Which pharmacy/location (including street and city if local pharmacy) is medication to be sent to? [facility] [address] [place] - [address]  3. Do they need a 30 day or 90 day supply? 30 days

## 2020-06-30 NOTE — Telephone Encounter (Signed)
Pt's medication was sent to pt's pharmacy as requested. Confirmation received.  °

## 2020-07-11 ENCOUNTER — Other Ambulatory Visit: Payer: Medicare Other

## 2020-07-11 ENCOUNTER — Other Ambulatory Visit: Payer: Self-pay | Admitting: Radiology

## 2020-07-11 DIAGNOSIS — Z20822 Contact with and (suspected) exposure to covid-19: Secondary | ICD-10-CM

## 2020-07-12 LAB — NOVEL CORONAVIRUS, NAA: SARS-CoV-2, NAA: NOT DETECTED

## 2020-07-12 LAB — SARS-COV-2, NAA 2 DAY TAT

## 2020-07-15 ENCOUNTER — Ambulatory Visit: Payer: Medicare Other | Admitting: Physician Assistant

## 2020-07-15 ENCOUNTER — Ambulatory Visit: Payer: Medicare Other | Admitting: Allergy and Immunology

## 2020-07-15 ENCOUNTER — Ambulatory Visit: Payer: Medicare Other | Attending: Internal Medicine

## 2020-07-15 DIAGNOSIS — Z23 Encounter for immunization: Secondary | ICD-10-CM

## 2020-07-15 NOTE — Progress Notes (Signed)
   Covid-19 Vaccination Clinic  Name:  [name]    MRN: [id number] DOB: Jun 19, 1946  07/15/2020  [name] was observed post Covid-19 immunization for 30 minutes based on pre-vaccination screening without incident. He was provided with Vaccine Information Sheet and instruction to access the V-Safe system.   [name] was instructed to call 911 with any severe reactions post vaccine: Marland Kitchen Difficulty breathing  . Swelling of face and throat  . A fast heartbeat  . A bad rash all over body  . Dizziness and weakness   Immunizations Administered    Name Date Dose VIS Date Route   Pfizer COVID-19 Vaccine 07/15/2020 12:08 PM 0.3 mL 01/23/2019 Intramuscular   Manufacturer: [employer], [employer]   Lot: [id number]   NDC: [address]-9

## 2020-08-05 ENCOUNTER — Ambulatory Visit: Payer: Medicare Other | Admitting: Interventional Cardiology

## 2020-08-05 ENCOUNTER — Ambulatory Visit: Payer: Medicare Other | Attending: Internal Medicine

## 2020-08-05 DIAGNOSIS — Z23 Encounter for immunization: Secondary | ICD-10-CM

## 2020-08-05 NOTE — Progress Notes (Signed)
   Covid-19 Vaccination Clinic  Name:  NEKODA CHOCK    MRN: [id number] DOB: 02-19-1946  08/05/2020  Mr. Saks was observed post Covid-19 immunization for 30 minutes based on pre-vaccination screening without incident. He was provided with Vaccine Information Sheet and instruction to access the V-Safe system.   Mr. Leavell was instructed to call 911 with any severe reactions post vaccine: Marland Kitchen Difficulty breathing  . Swelling of face and throat  . A fast heartbeat  . A bad rash all over body  . Dizziness and weakness   Immunizations Administered    Name Date Dose VIS Date Route   Pfizer COVID-19 Vaccine 08/05/2020  1:05 PM 0.3 mL 01/23/2019 Intramuscular   Manufacturer: [employer], [employer]   Lot: [id number]   NDC: [id number]

## 2020-08-09 ENCOUNTER — Other Ambulatory Visit: Payer: Self-pay | Admitting: Interventional Cardiology

## 2020-08-11 ENCOUNTER — Encounter: Payer: Self-pay | Admitting: Emergency Medicine

## 2020-08-11 ENCOUNTER — Other Ambulatory Visit: Payer: Self-pay

## 2020-08-11 ENCOUNTER — Ambulatory Visit (INDEPENDENT_AMBULATORY_CARE_PROVIDER_SITE_OTHER): Payer: Medicare Other | Admitting: Emergency Medicine

## 2020-08-11 VITALS — BP 186/86 | HR 98 | Temp 97.7°F | Ht 71.0 in | Wt 229.0 lb

## 2020-08-11 DIAGNOSIS — F1010 Alcohol abuse, uncomplicated: Secondary | ICD-10-CM | POA: Diagnosis not present

## 2020-08-11 DIAGNOSIS — M5431 Sciatica, right side: Secondary | ICD-10-CM

## 2020-08-11 DIAGNOSIS — Z9289 Personal history of other medical treatment: Secondary | ICD-10-CM

## 2020-08-11 DIAGNOSIS — I5032 Chronic diastolic (congestive) heart failure: Secondary | ICD-10-CM

## 2020-08-11 DIAGNOSIS — I110 Hypertensive heart disease with heart failure: Secondary | ICD-10-CM | POA: Diagnosis not present

## 2020-08-11 MED ORDER — METOPROLOL SUCCINATE ER 25 MG PO TB24: 25.0000 mg | ORAL_TABLET | Freq: Every day | ORAL | 0 refills | Status: DC

## 2020-08-11 MED ORDER — CHLORDIAZEPOXIDE HCL 25 MG PO CAPS: 25.0000 mg | ORAL_CAPSULE | Freq: Two times a day (BID) | ORAL | 0 refills | Status: DC | PRN

## 2020-08-11 MED ORDER — PREDNISONE 20 MG PO TABS: 40.0000 mg | ORAL_TABLET | Freq: Every day | ORAL | 0 refills | Status: AC

## 2020-08-11 NOTE — Telephone Encounter (Signed)
Pt's medication was sent to pt's pharmacy as requested. Confirmation received.  °

## 2020-08-11 NOTE — Progress Notes (Signed)
[name] 74 y.o.   Chief Complaint  Patient presents with  . Back Pain    Pt reports having cyatic nerve pain. Pt states his pain starts in his Lower back on the R side and it shoots all the way down his leg consintrating in the R foot. pt states this pain stater 4-5 days after his firts COVID-19 vaccine.  . Depression    Pt reports he started drinking to help sleep with his pain and this has him depressed and anxious.    HISTORY OF PRESENT ILLNESS: This is a 74 y.o. male complaining of "sciatica pain" for the past 4 to 5 days.  States pain is sharp and starts on the right lumbar area and radiates down the back of his right leg all the way down to his foot.  Denies injuries. Patient has history of chronic alcohol abuse and states he started drinking again due to his pain. Denies abdominal or pelvic pain.  No other significant symptoms. No other complaints or medical concerns today.  HPI   Prior to Admission medications   Medication Sig Start Date End Date Taking? Authorizing Provider  amLODipine (NORVASC) 10 MG tablet Take 1 tablet (10 mg total) by mouth daily. 04/29/20  Yes Fawze, Mina A, PA-C  chlordiazePOXIDE (LIBRIUM) 25 MG capsule Take 1 capsule (25 mg total) by mouth 2 (two) times daily as needed for anxiety. 08/11/20  Yes Demontae Antunes, Eilleen Kempf, MD  FLUoxetine (PROZAC) 20 MG capsule Take 20 mg by mouth daily.   Yes [provider]  furosemide (LASIX) 20 MG tablet Take 1 tablet by mouth once daily 02/26/20  Yes Lyn Records, MD  ondansetron (ZOFRAN ODT) 4 MG disintegrating tablet Take 1 tablet (4 mg total) by mouth every 8 (eight) hours as needed for nausea or vomiting. 04/29/20  Yes Fawze, Mina A, PA-C  pantoprazole (PROTONIX) 20 MG tablet Take 1 tablet (20 mg total) by mouth 2 (two) times daily before a meal. 04/29/20  Yes Fawze, Mina A, PA-C  thiamine (VITAMIN B-1) 100 MG tablet Take 100 mg by mouth daily.   Yes [provider]  albuterol (PROVENTIL  HFA;VENTOLIN HFA) 108 (90 Base) MCG/ACT inhaler Inhale 2 puffs into the lungs every 4 (four) hours as needed for up to 30 days for wheezing or shortness of breath. Patient not taking: Reported on 06/15/2019 03/08/19 05/24/19  Ronnie Doss A, PA-C  metoprolol succinate (TOPROL-XL) 25 MG 24 hr tablet Take 1 tablet (25 mg total) by mouth daily. Please keep upcoming appt in Octoberbefore anymore refills. Final AttemptThank you 08/11/20   Lyn Records, MD  predniSONE (DELTASONE) 20 MG tablet Take 2 tablets (40 mg total) by mouth daily with breakfast for 5 days. 08/11/20 08/16/20  Georgina Quint, MD  losartan (COZAAR) 50 MG tablet Take 50 mg by mouth daily.  04/29/20  [provider]    Allergies  Allergen Reactions  . Codeine Palpitations  . Vicodin [Hydrocodone-Acetaminophen] Nausea And Vomiting, Swelling and Palpitations    Patient Active Problem List   Diagnosis Date Noted  . Alcohol abuse with alcohol-induced mood disorder (HCC) 06/16/2019  . PTSD (post-traumatic stress disorder) 06/16/2019  . Thrombocytopenia (HCC) 02/24/2019  . Chronic alcohol abuse 12/09/2015  . Coronary artery disease involving coronary bypass graft of native heart without angina pectoris 08/07/2014  . Essential hypertension 08/07/2014  . Hyperlipidemia 08/07/2014    Past Medical History:  Diagnosis Date  . Anginal pain (HCC) 01/2005  . Anxiety   .  Arthritis    "knees"  . Chronic lower back pain   . Coronary artery disease   . Depression   . Hepatitis    "don't know what kind; they treated me for it; was a long time ago" (12/11/2018)  . High cholesterol   . History of bronchitis    "used to get it alot" (08/25/2012)  . Hypertension   . Panic attacks   . PTSD (post-traumatic stress disorder)    "have been treated in the past" (08/25/2012)    Past Surgical History:  Procedure Laterality Date  . CARDIAC CATHETERIZATION  01/2005  . CORONARY ARTERY BYPASS GRAFT  01/2005   CABG X3  . Shrapnel Left  1969   LLE; left lateral thumb (required grafting)  . VARICOSE VEIN SURGERY  1980's   LLE    Social History   Socioeconomic History  . Marital status: Divorced    Spouse name: Not on file  . Number of children: Not on file  . Years of education: Not on file  . Highest education level: Not on file  Occupational History  . Not on file  Tobacco Use  . Smoking status: Former Smoker    Packs/day: 2.00    Years: 5.00    Pack years: 10.00  . Smokeless tobacco: Never Used  . Tobacco comment: 08/25/2012 "quit smoking cigarettes 40 yr ago"  Vaping Use  . Vaping Use: Never used  Substance and Sexual Activity  . Alcohol use: Yes    Alcohol/week: 60.0 standard drinks    Types: 60 Standard drinks or equivalent per week    Comment: 12/11/2018 "~ 1 pint of vodka/day"  . Drug use: Yes    Types: Marijuana    Comment: 08/25/2012 "last marijuana 10 years ago"  . Sexual activity: Yes  Other Topics Concern  . Not on file  Social History Narrative  . Not on file   Social Determinants of Health   Financial Resource Strain:   . Difficulty of Paying Living Expenses: Not on file  Food Insecurity:   . Worried About Programme researcher, broadcasting/film/video in the Last Year: Not on file  . Ran Out of Food in the Last Year: Not on file  Transportation Needs:   . Lack of Transportation (Medical): Not on file  . Lack of Transportation (Non-Medical): Not on file  Physical Activity:   . Days of Exercise per Week: Not on file  . Minutes of Exercise per Session: Not on file  Stress:   . Feeling of Stress : Not on file  Social Connections:   . Frequency of Communication with Friends and Family: Not on file  . Frequency of Social Gatherings with Friends and Family: Not on file  . Attends Religious Services: Not on file  . Active Member of Clubs or Organizations: Not on file  . Attends Banker Meetings: Not on file  . Marital Status: Not on file  Intimate Partner Violence:   . Fear of Current or  Ex-Partner: Not on file  . Emotionally Abused: Not on file  . Physically Abused: Not on file  . Sexually Abused: Not on file    Family History  Problem Relation Age of Onset  . Diabetes Mother   . Heart disease Father   . Cancer Brother      Review of Systems  Constitutional: Negative.  Negative for chills and fever.  HENT: Negative.  Negative for congestion and sore throat.   Respiratory: Negative.  Negative for cough  and shortness of breath.   Cardiovascular: Negative.  Negative for chest pain and palpitations.  Gastrointestinal: Negative.  Negative for abdominal pain, diarrhea, nausea and vomiting.  Genitourinary: Negative.  Negative for dysuria and hematuria.  Musculoskeletal: Positive for back pain.  Skin: Negative.  Negative for rash.  Neurological: Negative.  Negative for dizziness, sensory change, focal weakness and headaches.  All other systems reviewed and are negative.  Today's Vitals   08/11/20 1503  BP: (!) 186/86  Pulse: 98  Temp: 97.7 F (36.5 C)  TempSrc: Temporal  SpO2: 98%  Weight: 229 lb (103.9 kg)  Height:  (1.803 m)   Body mass index is 31.94 kg/m.   Physical Exam Vitals reviewed.  Constitutional:      Appearance: Normal appearance.  HENT:     Head: Normocephalic.  Eyes:     Extraocular Movements: Extraocular movements intact.     Pupils: Pupils are equal, round, and reactive to light.  Cardiovascular:     Rate and Rhythm: Normal rate and regular rhythm.     Pulses: Normal pulses.     Heart sounds: Normal heart sounds.  Pulmonary:     Effort: Pulmonary effort is normal.     Breath sounds: Normal breath sounds.  Abdominal:     General: Bowel sounds are normal. There is no distension.     Palpations: Abdomen is soft.     Tenderness: There is no abdominal tenderness.  Musculoskeletal:        General: Normal range of motion.     Cervical back: Normal range of motion.     Comments: Lower extremities: Warm to touch, no erythema or  ecchymosis, no swelling or tenderness, full range of motion, good peripheral pulses with excellent capillary refill, neurovascularly intact.  Skin:    General: Skin is warm and dry.     Capillary Refill: Capillary refill takes less than 2 seconds.  Neurological:     General: No focal deficit present.     Mental Status: He is alert and oriented to person, place, and time.  Psychiatric:        Mood and Affect: Mood normal.        Behavior: Behavior normal.    A total of 30 minutes was spent with the patient, greater than 50% of which was in counseling/coordination of care regarding differential diagnosis for sciatica, treatment and prognosis, review of all medications, advised to stay off NSAIDs, chronic alcohol abuse and need to stop, review of most recent office visit notes, review of most recent blood work results, diet and nutrition prognosis and need for follow-up.   ASSESSMENT & PLAN: [name] was seen today for back pain and depression.  Diagnoses and all orders for this visit:  Sciatica of right side -     predniSONE (DELTASONE) 20 MG tablet; Take 2 tablets (40 mg total) by mouth daily with breakfast for 5 days.  S/P alcohol detoxification -     chlordiazePOXIDE (LIBRIUM) 25 MG capsule; Take 1 capsule (25 mg total) by mouth 2 (two) times daily as needed for anxiety.  Hypertensive heart disease with chronic diastolic congestive heart failure (HCC)  Chronic alcohol abuse    Patient Instructions       If you have lab work done today you will be contacted with your lab results within the next 2 weeks.  If you have not heard from [place] then please contact us. The fastest way to get your results is to register for My Chart.  IF you received an x-ray today, you will receive an invoice from [facility]. Please contact [facility] at [phone] with questions or concerns regarding your invoice.   IF you received labwork today, you will receive an invoice from  [facility]. Please contact [facility] at [phone] with questions or concerns regarding your invoice.   Our billing staff will not be able to assist you with questions regarding bills from these companies.  You will be contacted with the lab results as soon as they are available. The fastest way to get your results is to activate your My Chart account. Instructions are located on the last page of this paperwork. If you have not heard from us regarding the results in 2 weeks, please contact this office.     Sciatica  Sciatica is pain, weakness, tingling, or loss of feeling (numbness) along the sciatic nerve. The sciatic nerve starts in the lower back and goes down the back of each leg. Sciatica usually goes away on its own or with treatment. Sometimes, sciatica may come back (recur). What are the causes? This condition happens when the sciatic nerve is pinched or has pressure put on it. This may be the result of:  A disk in between the bones of the spine bulging out too far (herniated disk).  Changes in the spinal disks that occur with aging.  A condition that affects a muscle in the butt.  Extra bone growth near the sciatic nerve.  A break (fracture) of the area between your hip bones (pelvis).  Pregnancy.  Tumor. This is rare. What increases the risk? You are more likely to develop this condition if you:  Play sports that put pressure or stress on the spine.  Have poor strength and ease of movement (flexibility).  Have had a back injury in the past.  Have had back surgery.  Sit for long periods of time.  Do activities that involve bending or lifting over and over again.  Are very overweight (obese). What are the signs or symptoms? Symptoms can vary from mild to very bad. They may include:  Any of these problems in the lower back, leg, hip, or butt: ? Mild tingling, loss of feeling, or dull aches. ? Burning sensations. ? Sharp pains.  Loss of feeling in the back of  the calf or the sole of the foot.  Leg weakness.  Very bad back pain that makes it hard to move. These symptoms may get worse when you cough, sneeze, or laugh. They may also get worse when you sit or stand for long periods of time. How is this treated? This condition often gets better without any treatment. However, treatment may include:  Changing or cutting back on physical activity when you have pain.  Doing exercises and stretching.  Putting ice or heat on the affected area.  Medicines that help: ? To relieve pain and swelling. ? To relax your muscles.  Shots (injections) of medicines that help to relieve pain, irritation, and swelling.  Surgery. Follow these instructions at home: Medicines  Take over-the-counter and prescription medicines only as told by your doctor.  Ask your doctor if the medicine prescribed to you: ? Requires you to avoid driving or using heavy machinery. ? Can cause trouble pooping (constipation). You may need to take these steps to prevent or treat trouble pooping:  Drink enough fluids to keep your pee (urine) pale yellow.  Take over-the-counter or prescription medicines.  Eat foods that are high in fiber. These include beans,  whole grains, and fresh fruits and vegetables.  Limit foods that are high in fat and sugar. These include fried or sweet foods. Managing pain      If told, put ice on the affected area. ? Put ice in a plastic bag. ? Place a towel between your skin and the bag. ? Leave the ice on for 20 minutes, 2-3 times a day.  If told, put heat on the affected area. Use the heat source that your doctor tells you to use, such as a moist heat pack or a heating pad. ? Place a towel between your skin and the heat source. ? Leave the heat on for 20-30 minutes. ? Remove the heat if your skin turns bright red. This is very important if you are unable to feel pain, heat, or cold. You may have a greater risk of getting  burned. Activity   Return to your normal activities as told by your doctor. Ask your doctor what activities are safe for you.  Avoid activities that make your symptoms worse.  Take short rests during the day. ? When you rest for a long time, do some physical activity or stretching between periods of rest. ? Avoid sitting for a long time without moving. Get up and move around at least one time each hour.  Exercise and stretch regularly, as told by your doctor.  Do not lift anything that is heavier than 10 lb (4.5 kg) while you have symptoms of sciatica. ? Avoid lifting heavy things even when you do not have symptoms. ? Avoid lifting heavy things over and over.  When you lift objects, always lift in a way that is safe for your body. To do this, you should: ? Bend your knees. ? Keep the object close to your body. ? Avoid twisting. General instructions  Stay at a healthy weight.  Wear comfortable shoes that support your feet. Avoid wearing high heels.  Avoid sleeping on a mattress that is too soft or too hard. You might have less pain if you sleep on a mattress that is firm enough to support your back.  Keep all follow-up visits as told by your doctor. This is important. Contact a doctor if:  You have pain that: ? Wakes you up when you are sleeping. ? Gets worse when you lie down. ? Is worse than the pain you have had in the past. ? Lasts longer than 4 weeks.  You lose weight without trying. Get help right away if:  You cannot control when you pee (urinate) or poop (have a bowel movement).  You have weakness in any of these areas and it gets worse: ? Lower back. ? The area between your hip bones. ? Butt. ? Legs.  You have redness or swelling of your back.  You have a burning feeling when you pee. Summary  Sciatica is pain, weakness, tingling, or loss of feeling (numbness) along the sciatic nerve.  This condition happens when the sciatic nerve is pinched or has  pressure put on it.  Sciatica can cause pain, tingling, or loss of feeling (numbness) in the lower back, legs, hips, and butt.  Treatment often includes rest, exercise, medicines, and putting ice or heat on the affected area. This information is not intended to replace advice given to you by your health care provider. Make sure you discuss any questions you have with your health care provider. Document Revised: 12/04/2018 Document Reviewed: 12/04/2018 Elsevier Patient Education  2020 [employer].  Myiesha Edgar, MD [facility] 

## 2020-08-11 NOTE — Patient Instructions (Addendum)
   If you have lab work done today you will be contacted with your lab results within the next 2 weeks.  If you have not heard from us then please contact us. The fastest way to get your results is to register for My Chart.   IF you received an x-ray today, you will receive an invoice from [facility]. Please contact [facility] at [phone] with questions or concerns regarding your invoice.   IF you received labwork today, you will receive an invoice from [facility]. Please contact [facility] at [phone] with questions or concerns regarding your invoice.   Our billing staff will not be able to assist you with questions regarding bills from these companies.  You will be contacted with the lab results as soon as they are available. The fastest way to get your results is to activate your My Chart account. Instructions are located on the last page of this paperwork. If you have not heard from us regarding the results in 2 weeks, please contact this office.     Sciatica  Sciatica is pain, weakness, tingling, or loss of feeling (numbness) along the sciatic nerve. The sciatic nerve starts in the lower back and goes down the back of each leg. Sciatica usually goes away on its own or with treatment. Sometimes, sciatica may come back (recur). What are the causes? This condition happens when the sciatic nerve is pinched or has pressure put on it. This may be the result of:  A disk in between the bones of the spine bulging out too far (herniated disk).  Changes in the spinal disks that occur with aging.  A condition that affects a muscle in the butt.  Extra bone growth near the sciatic nerve.  A break (fracture) of the area between your hip bones (pelvis).  Pregnancy.  Tumor. This is rare. What increases the risk? You are more likely to develop this condition if you:  Play sports that put pressure or stress on the spine.  Have poor strength and ease of movement  (flexibility).  Have had a back injury in the past.  Have had back surgery.  Sit for long periods of time.  Do activities that involve bending or lifting over and over again.  Are very overweight (obese). What are the signs or symptoms? Symptoms can vary from mild to very bad. They may include:  Any of these problems in the lower back, leg, hip, or butt: ? Mild tingling, loss of feeling, or dull aches. ? Burning sensations. ? Sharp pains.  Loss of feeling in the back of the calf or the sole of the foot.  Leg weakness.  Very bad back pain that makes it hard to move. These symptoms may get worse when you cough, sneeze, or laugh. They may also get worse when you sit or stand for long periods of time. How is this treated? This condition often gets better without any treatment. However, treatment may include:  Changing or cutting back on physical activity when you have pain.  Doing exercises and stretching.  Putting ice or heat on the affected area.  Medicines that help: ? To relieve pain and swelling. ? To relax your muscles.  Shots (injections) of medicines that help to relieve pain, irritation, and swelling.  Surgery. Follow these instructions at home: Medicines  Take over-the-counter and prescription medicines only as told by your doctor.  Ask your doctor if the medicine prescribed to you: ? Requires you to avoid driving or using   heavy machinery. ? Can cause trouble pooping (constipation). You may need to take these steps to prevent or treat trouble pooping:  Drink enough fluids to keep your pee (urine) pale yellow.  Take over-the-counter or prescription medicines.  Eat foods that are high in fiber. These include beans, whole grains, and fresh fruits and vegetables.  Limit foods that are high in fat and sugar. These include fried or sweet foods. Managing pain      If told, put ice on the affected area. ? Put ice in a plastic bag. ? Place a towel between  your skin and the bag. ? Leave the ice on for 20 minutes, 2-3 times a day.  If told, put heat on the affected area. Use the heat source that your doctor tells you to use, such as a moist heat pack or a heating pad. ? Place a towel between your skin and the heat source. ? Leave the heat on for 20-30 minutes. ? Remove the heat if your skin turns bright red. This is very important if you are unable to feel pain, heat, or cold. You may have a greater risk of getting burned. Activity   Return to your normal activities as told by your doctor. Ask your doctor what activities are safe for you.  Avoid activities that make your symptoms worse.  Take short rests during the day. ? When you rest for a long time, do some physical activity or stretching between periods of rest. ? Avoid sitting for a long time without moving. Get up and move around at least one time each hour.  Exercise and stretch regularly, as told by your doctor.  Do not lift anything that is heavier than 10 lb (4.5 kg) while you have symptoms of sciatica. ? Avoid lifting heavy things even when you do not have symptoms. ? Avoid lifting heavy things over and over.  When you lift objects, always lift in a way that is safe for your body. To do this, you should: ? Bend your knees. ? Keep the object close to your body. ? Avoid twisting. General instructions  Stay at a healthy weight.  Wear comfortable shoes that support your feet. Avoid wearing high heels.  Avoid sleeping on a mattress that is too soft or too hard. You might have less pain if you sleep on a mattress that is firm enough to support your back.  Keep all follow-up visits as told by your doctor. This is important. Contact a doctor if:  You have pain that: ? Wakes you up when you are sleeping. ? Gets worse when you lie down. ? Is worse than the pain you have had in the past. ? Lasts longer than 4 weeks.  You lose weight without trying. Get help right away  if:  You cannot control when you pee (urinate) or poop (have a bowel movement).  You have weakness in any of these areas and it gets worse: ? Lower back. ? The area between your hip bones. ? Butt. ? Legs.  You have redness or swelling of your back.  You have a burning feeling when you pee. Summary  Sciatica is pain, weakness, tingling, or loss of feeling (numbness) along the sciatic nerve.  This condition happens when the sciatic nerve is pinched or has pressure put on it.  Sciatica can cause pain, tingling, or loss of feeling (numbness) in the lower back, legs, hips, and butt.  Treatment often includes rest, exercise, medicines, and putting ice or heat   on the affected area. This information is not intended to replace advice given to you by your health care provider. Make sure you discuss any questions you have with your health care provider. Document Revised: 12/04/2018 Document Reviewed: 12/04/2018 Elsevier Patient Education  2020 [employer]  

## 2020-08-25 NOTE — Progress Notes (Signed)
CARDIOLOGY OFFICE NOTE  Date:  09/03/2020    [name] Date of Birth: 11-16-1946 Medical Record #[id number]  PCP:  Georgina Quint, MD  Cardiologist:  Katrinka Blazing    Chief Complaint  Patient presents with  . Follow-up    Seen for Dr. Katrinka Blazing    History of Present Illness: [name] is a 74 y.o. male who presents today for a follow up visit. Seen for Dr. Katrinka Blazing.   He has a history of known CAD s/p CABG x3, with early graft failure and re-do surgery (2006), HTN, HLD,PTSD,alcohol abuse, diastolic dysfunction and obesity with stable chest pain. Remote Myoview from 2016.   He was last seen in June of 2020 and felt to be doing ok. Was intentionally losing weight at that time.   Comes in today. Here alone. Needs his Toprol refilled.  He had picked up some weight - now back intentionally lose weight. Restricting his carbs. No fried foods. He is off most of his BP medicines - says he was told it needed to be higher given his age. Sounds like Losartan may have been stopped due to high potassium level. He says he has cut back on hs alcohol but does not quantify. Seeing Psych for his PTSD issues. Lipids from June not ideal - elevated LFTs as well - he is not on statin. He feels like he can improve this with his diet and walking.    Past Medical History:  Diagnosis Date  . Anginal pain (HCC) 01/2005  . Anxiety   . Arthritis    "knees"  . Chronic lower back pain   . Coronary artery disease   . Depression   . Hepatitis    "don't know what kind; they treated me for it; was a long time ago" (12/11/2018)  . High cholesterol   . History of bronchitis    "used to get it alot" (08/25/2012)  . Hypertension   . Panic attacks   . PTSD (post-traumatic stress disorder)    "have been treated in the past" (08/25/2012)    Past Surgical History:  Procedure Laterality Date  . CARDIAC CATHETERIZATION  01/2005  . CORONARY ARTERY BYPASS GRAFT  01/2005   CABG X3  . Shrapnel Left 1969    LLE; left lateral thumb (required grafting)  . VARICOSE VEIN SURGERY  1980's   LLE     Medications: Current Meds  Medication Sig  . chlordiazePOXIDE (LIBRIUM) 25 MG capsule Take 1 capsule (25 mg total) by mouth 2 (two) times daily as needed for anxiety.  Marland Kitchen FLUoxetine (PROZAC) 20 MG capsule Take 20 mg by mouth daily.  . furosemide (LASIX) 20 MG tablet Take 1 tablet by mouth once daily  . metoprolol succinate (TOPROL-XL) 25 MG 24 hr tablet Take 1 tablet (25 mg total) by mouth daily. Please keep upcoming appt in Octoberbefore anymore refills. Final AttemptThank you  . pantoprazole (PROTONIX) 20 MG tablet Take 1 tablet (20 mg total) by mouth 2 (two) times daily before a meal.     Allergies: Allergies  Allergen Reactions  . Codeine Palpitations  . Vicodin [Hydrocodone-Acetaminophen] Nausea And Vomiting, Swelling and Palpitations    Social History: The patient  reports that he has quit smoking. He has a 10.00 pack-year smoking history. He has never used smokeless tobacco. He reports current alcohol use of about 60.0 standard drinks of alcohol per week. He reports current drug use. Drug: Marijuana.   Family History: The patient's family history includes  Cancer in his brother; Diabetes in his mother; Heart disease in his father.   Review of Systems: Please see the history of present illness.   All other systems are reviewed and negative.   Physical Exam: VS:  BP (!) 144/70   Pulse (!) 52   Ht 5\' 11"  (1.803 m)   Wt 227 lb 3.2 oz (103.1 kg)   SpO2 96%   BMI 31.69 kg/m  .  BMI Body mass index is 31.69 kg/m.  Wt Readings from Last 3 Encounters:  09/03/20 227 lb 3.2 oz (103.1 kg)  08/11/20 229 lb (103.9 kg)  05/29/20 236 lb (107 kg)   BP is 140/80 by me.   General: Pleasant. Alert and in no acute distress.   Cardiac: Regular rate and rhythm. No murmurs, rubs, or gallops. No edema.  Respiratory:  Lungs are clear to auscultation bilaterally with normal work of breathing.  GI:  Soft and nontender.  MS: No deformity or atrophy. Gait and ROM intact.  Skin: Warm and dry. Color is normal.  Neuro:  Strength and sensation are intact and no gross focal deficits noted.  Psych: Alert, appropriate and with normal affect.   LABORATORY DATA:  EKG:  EKG is not ordered today.    Lab Results  Component Value Date   WBC 4.8 05/02/2020   HGB 12.3 (L) 05/02/2020   HCT 37.6 (L) 05/02/2020   PLT 87 (L) 05/02/2020   GLUCOSE 96 05/06/2020   CHOL 251 (H) 08/26/2012   TRIG 282 (H) 08/26/2012   HDL 36 (L) 08/26/2012   LDLCALC 159 (H) 08/26/2012   ALT 63 (H) 05/06/2020   AST 57 (H) 05/06/2020   NA 136 05/06/2020   K 4.3 05/06/2020   CL 104 05/06/2020   CREATININE 1.28 (H) 05/06/2020   BUN 19 05/06/2020   CO2 20 05/06/2020   INR 1.3 (H) 02/24/2019   HGBA1C 5.8 (H) 01/24/2019     BNP (last 3 results) No results for input(s): BNP in the last 8760 hours.  ProBNP (last 3 results) No results for input(s): PROBNP in the last 8760 hours.   Other Studies Reviewed Today:     ASSESSMENT & PLAN:    1. CAD with remote CABG - he has no chest pain - he is trying to get back on track with CV risk factor modification - I think his PTSD issues make this challenging for him.   2. HTN - off Norvasc and Losartan - he does not wish to restart anything - wants to keep working on weight loss. Repeat by me is 140/80.   3. HLD - lipids from June not ideal - he is aware - again, he feels like he can do this with diet/exercise/weight loss - wants to try for a bit longer. He has elevated LFTs.   4. Chronic diastolic HF - seems euvolemic. Needs good BP control.   5. Chronic alcohol abuse - seeing Psyche.   Current medicines are reviewed with the patient today.  The patient does not have concerns regarding medicines other than what has been noted above.  The following changes have been made:  See above.  Labs/ tests ordered today include:   No orders of the defined types were  placed in this encounter.    Disposition:   FU with July in about 4 months to see where he is and recheck BP. Hopefully he can stay on track but his current regimen is not ideal per his preference.  Patient is agreeable to this plan and will call if any problems develop in the interim.   SignedNorma Fredrickson, NP  09/03/2020 2:00 PM  [facility] [address] [place], [place]  [address] Phone: [phone] Fax: [phone]

## 2020-09-02 ENCOUNTER — Ambulatory Visit: Payer: Medicare Other | Admitting: Allergy and Immunology

## 2020-09-03 ENCOUNTER — Ambulatory Visit (INDEPENDENT_AMBULATORY_CARE_PROVIDER_SITE_OTHER): Payer: Medicare Other | Admitting: Nurse Practitioner

## 2020-09-03 ENCOUNTER — Encounter: Payer: Self-pay | Admitting: Nurse Practitioner

## 2020-09-03 ENCOUNTER — Other Ambulatory Visit: Payer: Self-pay

## 2020-09-03 VITALS — BP 144/70 | HR 52 | Ht 71.0 in | Wt 227.2 lb

## 2020-09-03 DIAGNOSIS — I25810 Atherosclerosis of coronary artery bypass graft(s) without angina pectoris: Secondary | ICD-10-CM

## 2020-09-03 DIAGNOSIS — R600 Localized edema: Secondary | ICD-10-CM | POA: Diagnosis not present

## 2020-09-03 DIAGNOSIS — I10 Essential (primary) hypertension: Secondary | ICD-10-CM

## 2020-09-03 DIAGNOSIS — I5032 Chronic diastolic (congestive) heart failure: Secondary | ICD-10-CM | POA: Diagnosis not present

## 2020-09-03 MED ORDER — METOPROLOL SUCCINATE ER 25 MG PO TB24: 25.0000 mg | ORAL_TABLET | Freq: Every day | ORAL | 3 refills | Status: DC

## 2020-09-03 MED ORDER — FUROSEMIDE 20 MG PO TABS: 40.0000 mg | ORAL_TABLET | Freq: Every day | ORAL | 0 refills | Status: DC | PRN

## 2020-09-03 NOTE — Patient Instructions (Signed)
After Visit Summary:  We will be checking the following labs today - NONE   Medication Instructions:    Continue with your current medicines.    If you need a refill on your cardiac medications before your next appointment, please call your pharmacy.     Testing/Procedures To Be Arranged:  N/A  Follow-Up:   See Dr. Smith in 4 months     At [facility], you and your health needs are our priority.  As part of our continuing mission to provide you with exceptional heart care, we have created designated Provider Care Teams.  These Care Teams include your primary Cardiologist (physician) and Advanced Practice Providers (APPs -  Physician Assistants and Nurse Practitioners) who all work together to provide you with the care you need, when you need it.  Special Instructions:  . Stay safe, wash your hands for at least 20 seconds and wear a mask when needed.  . It was good to talk with you today.    Call the [facility] office at [phone] if you have any questions, problems or concerns.       

## 2020-09-11 ENCOUNTER — Other Ambulatory Visit: Payer: Self-pay | Admitting: Interventional Cardiology

## 2020-09-11 MED ORDER — METOPROLOL SUCCINATE ER 25 MG PO TB24
25.0000 mg | ORAL_TABLET | Freq: Every day | ORAL | 3 refills | Status: DC
Start: 2020-09-11 — End: 2021-08-12

## 2020-09-11 NOTE — Telephone Encounter (Signed)
*  STAT* If patient is at the pharmacy, call can be transferred to refill team.   1. Which medications need to be refilled? (please list name of each medication and dose if known) Metoprolol- please call today if possible- he is completely out of it  2. Which pharmacy/location (including street and city if local pharmacy) is medication to be sent to? [facility] [address] [place], [place]  3. Do they need a 30 day or 90 day supply? 90 days and refills

## 2020-09-17 ENCOUNTER — Telehealth: Payer: Self-pay | Admitting: Emergency Medicine

## 2020-09-17 NOTE — Telephone Encounter (Signed)
Patient is requesting a letter from Provider stating that he has arthritis.   He needs this for his attorney  Please advise

## 2020-09-18 NOTE — Telephone Encounter (Signed)
Pt wants letter stating he has arthritis, last OV was 08/11/2020 no OV in past 1 year that has addressed this issue directly. Also arthritis notes "knees" is this appropriate to write for him?

## 2020-09-18 NOTE — Telephone Encounter (Signed)
Letter written and signed. Placed up front pt has been called he states his sister Akif Weldy will be by to pick this up and he has approved her getting this today.

## 2020-09-18 NOTE — Telephone Encounter (Signed)
[name] has osteoarthritis.  It is appropriate to write a letter stating that he has osteoarthritis.  Thanks.

## 2020-10-14 ENCOUNTER — Telehealth: Payer: Self-pay | Admitting: Emergency Medicine

## 2020-10-14 ENCOUNTER — Ambulatory Visit: Payer: Medicare Other | Admitting: Allergy and Immunology

## 2020-10-14 NOTE — Telephone Encounter (Signed)
Needs OV for HTN due to elevated readings with new medication

## 2020-10-14 NOTE — Telephone Encounter (Signed)
Needs to be evaluated.  Thanks.

## 2020-10-14 NOTE — Telephone Encounter (Signed)
Patient is requesting a letter from Provider that states he is on medication chlordiazePOXIDE (LIBRIUM) 25 MG capsule [[id number]] for anxiety and stress management   from 08/11/2020 and that you taking for 2 weeks and that patient has  had elevated  BP  Please advise

## 2020-10-14 NOTE — Telephone Encounter (Signed)
Pt reports elevated BP since starting ChlordiazePoxide is this okay? Or should he be evaluated? Attempted to call and ask what his readings have been but his phone went to VM which was full.

## 2020-10-15 NOTE — Telephone Encounter (Signed)
Called pt to schedule appointment. Pt stated his BP is fine. Pt stated he called in to get a letter from Dr. Alvy Bimler stating that the past visits back in September he had High PB and anxiety because he was dealing with a situation with a insurance department and they stated they will combinate him in some way for the stress they caused. hat is why they are wanting the letter that states he had high pb and anxiety. Please advise.

## 2020-10-15 NOTE — Telephone Encounter (Signed)
Please advise on if it is ok to write patient a note stated the below msg

## 2020-10-16 NOTE — Telephone Encounter (Signed)
Spoke with Maralyn Sago to contact patient again to schedule appt because Dr Alvy Bimler states patient needs to be evaluated.

## 2020-11-04 ENCOUNTER — Ambulatory Visit: Payer: Medicare Other | Admitting: Emergency Medicine

## 2020-11-26 ENCOUNTER — Telehealth (HOSPITAL_COMMUNITY): Payer: Self-pay | Admitting: Licensed Clinical Social Worker

## 2020-12-01 ENCOUNTER — Other Ambulatory Visit: Payer: Self-pay

## 2020-12-01 ENCOUNTER — Emergency Department (HOSPITAL_COMMUNITY): Payer: Medicare Other

## 2020-12-01 ENCOUNTER — Emergency Department (HOSPITAL_COMMUNITY)
Admission: EM | Admit: 2020-12-01 | Discharge: 2020-12-01 | Disposition: A | Discharge status: Home / Self Care | Payer: Medicare Other | Attending: Emergency Medicine | Admitting: Emergency Medicine

## 2020-12-01 ENCOUNTER — Encounter (HOSPITAL_COMMUNITY): Payer: Self-pay | Admitting: Emergency Medicine

## 2020-12-01 DIAGNOSIS — R531 Weakness: Secondary | ICD-10-CM | POA: Diagnosis not present

## 2020-12-01 DIAGNOSIS — U071 COVID-19: Secondary | ICD-10-CM | POA: Insufficient documentation

## 2020-12-01 DIAGNOSIS — I2510 Atherosclerotic heart disease of native coronary artery without angina pectoris: Secondary | ICD-10-CM | POA: Insufficient documentation

## 2020-12-01 DIAGNOSIS — R42 Dizziness and giddiness: Secondary | ICD-10-CM | POA: Diagnosis not present

## 2020-12-01 DIAGNOSIS — R748 Abnormal levels of other serum enzymes: Secondary | ICD-10-CM

## 2020-12-01 DIAGNOSIS — R059 Cough, unspecified: Secondary | ICD-10-CM | POA: Diagnosis present

## 2020-12-01 DIAGNOSIS — R7401 Elevation of levels of liver transaminase levels: Secondary | ICD-10-CM | POA: Insufficient documentation

## 2020-12-01 DIAGNOSIS — I10 Essential (primary) hypertension: Secondary | ICD-10-CM | POA: Diagnosis not present

## 2020-12-01 DIAGNOSIS — R55 Syncope and collapse: Secondary | ICD-10-CM | POA: Diagnosis not present

## 2020-12-01 DIAGNOSIS — Z79899 Other long term (current) drug therapy: Secondary | ICD-10-CM | POA: Diagnosis not present

## 2020-12-01 DIAGNOSIS — Z87891 Personal history of nicotine dependence: Secondary | ICD-10-CM | POA: Insufficient documentation

## 2020-12-01 DIAGNOSIS — R945 Abnormal results of liver function studies: Secondary | ICD-10-CM | POA: Diagnosis not present

## 2020-12-01 DIAGNOSIS — R0902 Hypoxemia: Secondary | ICD-10-CM | POA: Diagnosis not present

## 2020-12-01 DIAGNOSIS — Z955 Presence of coronary angioplasty implant and graft: Secondary | ICD-10-CM | POA: Diagnosis not present

## 2020-12-01 LAB — BASIC METABOLIC PANEL
Anion gap: 12 (ref 5–15)
BUN: 15 mg/dL (ref 8–23)
CO2: 22 mmol/L (ref 22–32)
Calcium: 9.1 mg/dL (ref 8.9–10.3)
Chloride: 96 mmol/L — ABNORMAL LOW (ref 98–111)
Creatinine, Ser: 1.02 mg/dL (ref 0.61–1.24)
GFR, Estimated: >60 mL/min (ref >60)
Glucose, Bld: 124 mg/dL — ABNORMAL HIGH (ref 70–99)
Potassium: 3.7 mmol/L (ref 3.5–5.1)
Sodium: 130 mmol/L — ABNORMAL LOW (ref 135–145)

## 2020-12-01 LAB — URINALYSIS, ROUTINE W REFLEX MICROSCOPIC
Bacteria, UA: NONE SEEN
Bilirubin Urine: NEGATIVE
Glucose, UA: NEGATIVE mg/dL
Hgb urine dipstick: NEGATIVE
Ketones, ur: NEGATIVE mg/dL
Leukocytes,Ua: NEGATIVE
Nitrite: NEGATIVE
Protein, ur: 100 mg/dL — AB
Specific Gravity, Urine: 1.023 (ref 1.005–1.030)
pH: 5 (ref 5.0–8.0)

## 2020-12-01 LAB — CBC
HCT: 41.7 % (ref 39.0–52.0)
Hemoglobin: 14.8 g/dL (ref 13.0–17.0)
MCH: 32.1 pg (ref 26.0–34.0)
MCHC: 35.5 g/dL (ref 30.0–36.0)
MCV: 90.5 fL (ref 80.0–100.0)
Platelets: 81 10*3/uL — ABNORMAL LOW (ref 150–400)
RBC: 4.61 MIL/uL (ref 4.22–5.81)
RDW: 12.7 % (ref 11.5–15.5)
WBC: 3.8 10*3/uL — ABNORMAL LOW (ref 4.0–10.5)
nRBC: 0 % (ref 0.0–0.2)

## 2020-12-01 LAB — HEPATIC FUNCTION PANEL
ALT: 53 U/L — ABNORMAL HIGH (ref 0–44)
AST: 62 U/L — ABNORMAL HIGH (ref 15–41)
Albumin: 3.7 g/dL (ref 3.5–5.0)
Alkaline Phosphatase: 48 U/L (ref 38–126)
Bilirubin, Direct: 0.3 mg/dL — ABNORMAL HIGH (ref 0.0–0.2)
Indirect Bilirubin: 0.7 mg/dL (ref 0.3–0.9)
Total Bilirubin: 1 mg/dL (ref 0.3–1.2)
Total Protein: 8.7 g/dL — ABNORMAL HIGH (ref 6.5–8.1)

## 2020-12-01 LAB — TROPONIN I (HIGH SENSITIVITY): Troponin I (High Sensitivity): 7 ng/L (ref <18)

## 2020-12-01 LAB — LIPASE, BLOOD: Lipase: 34 U/L (ref 11–51)

## 2020-12-01 LAB — CBG MONITORING, ED: Glucose-Capillary: 103 mg/dL — ABNORMAL HIGH (ref 70–99)

## 2020-12-01 LAB — POC SARS CORONAVIRUS 2 AG -  ED: SARS Coronavirus 2 Ag: POSITIVE — AB

## 2020-12-01 MED ORDER — SODIUM CHLORIDE 0.9 % IV BOLUS
1000.0000 mL | Freq: Once | INTRAVENOUS | Status: AC
  Administered 2020-12-01: 1000 mL via INTRAVENOUS

## 2020-12-01 NOTE — ED Provider Notes (Signed)
[facility] EMERGENCY DEPARTMENT Provider Note   CSN: [id number] Arrival date & time: 12/01/20  1049     History Chief Complaint  Patient presents with  . Near Syncope    [name] is a 75 y.o. male.  HPI  75 year old male history of coronary artery disease, hepatitis, hypertension, high cholesterol, alcohol abuse presents today complaining of cough and weakness.  He states he has had a cough for several days.  His cough has been productive of yellowish sputum.  He has had some mild dyspnea.  He felt generally weak and lightheaded today.  He felt like he might pass out.  He noted that his blood pressure was high.  He called EMS and was transported here due to this.  He currently continues to feel generally weak.  He has received two Covid vaccines but is not eligible for booster at this time. Patient reports that he has been trying to detox from alcohol.  He states he has been cutting back.  He is gone from over a pint a day to approximately a pint a day.  He last drank alcohol 4 AM this morning.  He denies any nausea, vomiting, or feeling of alcohol withdrawal at this time.    Past Medical History:  Diagnosis Date  . Anginal pain ([place]) 01/2005  . Anxiety   . Arthritis    "knees"  . Chronic lower back pain   . Coronary artery disease   . Depression   . Hepatitis    "don't know what kind; they treated me for it; was a long time ago" (12/11/2018)  . High cholesterol   . History of bronchitis    "used to get it alot" (08/25/2012)  . Hypertension   . Panic attacks   . PTSD (post-traumatic stress disorder)    "have been treated in the past" (08/25/2012)    Patient Active Problem List   Diagnosis Date Noted  . Alcohol abuse with alcohol-induced mood disorder ([place]) 06/16/2019  . PTSD (post-traumatic stress disorder) 06/16/2019  . Thrombocytopenia ([place]) 02/24/2019  . Chronic alcohol abuse 12/09/2015  . Coronary artery disease involving coronary bypass graft  of native heart without angina pectoris 08/07/2014  . Essential hypertension 08/07/2014  . Hyperlipidemia 08/07/2014    Past Surgical History:  Procedure Laterality Date  . CARDIAC CATHETERIZATION  01/2005  . CORONARY ARTERY BYPASS GRAFT  01/2005   CABG X3  . Shrapnel Left 1969   LLE; left lateral thumb (required grafting)  . VARICOSE VEIN SURGERY  1980's   LLE       Family History  Problem Relation Age of Onset  . Diabetes Mother   . Heart disease Father   . Cancer Brother     Social History   Tobacco Use  . Smoking status: Former Smoker    Packs/day: 2.00    Years: 5.00    Pack years: 10.00  . Smokeless tobacco: Never Used  . Tobacco comment: 08/25/2012 "quit smoking cigarettes 40 yr ago"  Vaping Use  . Vaping Use: Never used  Substance Use Topics  . Alcohol use: Yes    Alcohol/week: 60.0 standard drinks    Types: 60 Standard drinks or equivalent per week    Comment: 12/11/2018 "~ 1 pint of vodka/day"  . Drug use: Yes    Types: Marijuana    Comment: 08/25/2012 "last marijuana 10 years ago"    Home Medications Prior to Admission medications   Medication Sig Start Date End Date Taking?  Authorizing Provider  albuterol (PROVENTIL HFA;VENTOLIN HFA) 108 (90 Base) MCG/ACT inhaler Inhale 2 puffs into the lungs every 4 (four) hours as needed for up to 30 days for wheezing or shortness of breath. 03/08/19 05/24/19  Ronnie Doss A, PA-C  chlordiazePOXIDE (LIBRIUM) 25 MG capsule Take 1 capsule (25 mg total) by mouth 2 (two) times daily as needed for anxiety. 08/11/20   Georgina Quint, MD  FLUoxetine (PROZAC) 20 MG capsule Take 20 mg by mouth daily.    [provider]  furosemide (LASIX) 20 MG tablet Take 2 tablets (40 mg total) by mouth daily as needed. 09/03/20   Rosalio Macadamia, NP  metoprolol succinate (TOPROL-XL) 25 MG 24 hr tablet Take 1 tablet (25 mg total) by mouth daily. 09/11/20   Rosalio Macadamia, NP  pantoprazole (PROTONIX) 20 MG tablet Take 1 tablet (20  mg total) by mouth 2 (two) times daily before a meal. 04/29/20   Fawze, Mina A, PA-C  losartan (COZAAR) 50 MG tablet Take 50 mg by mouth daily.  04/29/20  [provider]    Allergies    Codeine and Vicodin [hydrocodone-acetaminophen]  Review of Systems   Review of Systems  All other systems reviewed and are negative.   Physical Exam Updated Vital Signs BP (!) 174/71   Pulse 88   Temp 97.9 F (36.6 C) (Oral)   Resp 18   SpO2 98%   Physical Exam Vitals and nursing note reviewed.  HENT:     Head: Normocephalic.     Right Ear: External ear normal.     Left Ear: External ear normal.     Nose: Nose normal.     Mouth/Throat:     Mouth: Mucous membranes are moist.  Eyes:     Pupils: Pupils are equal, round, and reactive to light.  Cardiovascular:     Rate and Rhythm: Normal rate and regular rhythm.     Pulses: Normal pulses.     Heart sounds: Normal heart sounds.  Pulmonary:     Effort: Pulmonary effort is normal.     Breath sounds: Normal breath sounds.  Abdominal:     General: Abdomen is flat.     Palpations: Abdomen is soft.  Musculoskeletal:        General: Normal range of motion.     Cervical back: Normal range of motion.  Skin:    General: Skin is warm and dry.     Capillary Refill: Capillary refill takes less than 2 seconds.  Neurological:     General: No focal deficit present.     Mental Status: He is alert and oriented to person, place, and time.  Psychiatric:        Mood and Affect: Mood normal.        Behavior: Behavior normal.     ED Results / Procedures / Treatments   Labs (all labs ordered are listed, but only abnormal results are displayed) Labs Reviewed  BASIC METABOLIC PANEL - Abnormal; Notable for the following components:      Result Value   Sodium 130 (*)    Chloride 96 (*)    Glucose, Bld 124 (*)    All other components within normal limits  CBC - Abnormal; Notable for the following components:   WBC 3.8 (*)    Platelets 81 (*)     All other components within normal limits  URINALYSIS, ROUTINE W REFLEX MICROSCOPIC  HEPATIC FUNCTION PANEL  LIPASE, BLOOD  CBG MONITORING, ED  POC SARS CORONAVIRUS 2 AG -  ED  TROPONIN I (HIGH SENSITIVITY)    EKG EKG Interpretation  Date/Time:  Monday December 01 2020 10:46:53 EST Ventricular Rate:  77 PR Interval:  184 QRS Duration: 82 QT Interval:  386 QTC Calculation: 436 R Axis:   67 Text Interpretation: Normal sinus rhythm Nonspecific T wave abnormality Abnormal ECG T wave abnormality noted v2 and v3 Confirmed by Margarita Grizzle [phone]) on 12/01/2020 8:53:19 PM   Radiology DG Chest Port 1 View  Result Date: 12/01/2020 CLINICAL DATA:  Near syncope, alcohol abuse EXAM: PORTABLE CHEST 1 VIEW COMPARISON:  12/04/2019 FINDINGS: Single frontal view of the chest demonstrates stable postsurgical changes from median sternotomy. Cardiac silhouette is unremarkable. No airspace disease, effusion, or pneumothorax. No acute bony abnormalities. IMPRESSION: 1. No acute intrathoracic process. Electronically Signed   By: Sharlet Salina M.D.   On: 12/01/2020 21:31    Procedures Procedures (including critical care time)  Medications Ordered in ED Medications  sodium chloride 0.9 % bolus 1,000 mL (has no administration in time range)    ED Course  I have reviewed the triage vital signs and the nursing notes.  Pertinent labs & imaging results that were available during my care of the patient were reviewed by me and considered in my medical decision making (see chart for details).    MDM Rules/Calculators/A&P                          75 year old male known history of coronary artery disease presents today with cough and generalized weakness.  On exam here EKG obtained has some nonspecific T wave changes in V1 and V2.  This is repeated and has remained stable over his visit here.  Troponin is pending.  He is not complaining of any chest pain.  Have a low index of suspicion for coronary artery  disease as etiology Patient has also had cough and feels generally weak.  Covid is positive.  Patient has been having symptoms for the proximately the past 7 days and cough is productive of yellow sputum.  He had received 2 of the vaccines but has not been boosted at this time.  He appears to have mild disease.  He is not hypoxic and appears to be stable from this perspective.  He will be referred to the infusion clinic for any appropriate outpatient therapy. Patient has a history of alcohol abuse.  Is been trying to cut back.  Is not appear to be in any withdrawals.  He did ask about his liver enzymes and these were checked.  His AST is 62 and ALT is 53.  This is improved from the first prior. Sodium is mildly decreased at 130.  He does not appear in any overt symptoms of hyponatremia.  This is likely secondary to his alcohol intake. Patient also has a history of hypernatremia.  He is concerned because his blood pressure has been running high.  Here in the ED he has not had his meds all day.  He is on outpatient medication.  He does not appear to be symptomatic from his high blood pressure.  He is advised to continue to monitor at home and take his blood pressure medications as prescribed.  He is not tachycardic and this does not appear to be secondary to alcohol withdrawal. Patient advised regarding need to quarantine and follow-up.  Discussed return precautions and patient voices understanding. Final Clinical Impression(s) / ED Diagnoses Final diagnoses:  COVID  Weakness  Elevated liver enzymes    Rx / DC Orders ED Discharge Orders    None       Margarita Grizzle, MD 12/01/20 2307

## 2020-12-01 NOTE — Discharge Instructions (Signed)
Please quarantine until your symptoms have resolved and it has been 10 days. Your liver enzymes are elevate but appear improved from before Your bp remains elevated, please continue your home bp medicines and recheck with your doctor.  Take your bp twice a day and keep a log You do not appera to be withdrawing from alcohol now, but please discuss alcohol cessation with your doctor.

## 2020-12-01 NOTE — ED Triage Notes (Signed)
Pt from home via EMS for near syncope. Pt grabbed onto his couch and sat down, did not lose consciousness. Pt drinks at least one pint of liquor per day. Was supposed to be picked up from his house and taken to [facility] for detox for same. Last drink this morning.

## 2020-12-01 NOTE — ED Notes (Signed)
Pt moved to room 44 due to covid test results.

## 2020-12-03 ENCOUNTER — Telehealth: Payer: Self-pay | Admitting: Emergency Medicine

## 2020-12-03 NOTE — Telephone Encounter (Signed)
error 

## 2020-12-09 DIAGNOSIS — Z1152 Encounter for screening for COVID-19: Secondary | ICD-10-CM | POA: Diagnosis not present

## 2020-12-10 ENCOUNTER — Ambulatory Visit: Payer: Medicare Other | Admitting: Emergency Medicine

## 2020-12-11 ENCOUNTER — Telehealth: Payer: Self-pay | Admitting: Emergency Medicine

## 2020-12-11 ENCOUNTER — Ambulatory Visit (INDEPENDENT_AMBULATORY_CARE_PROVIDER_SITE_OTHER): Payer: Medicare Other | Admitting: Emergency Medicine

## 2020-12-11 ENCOUNTER — Other Ambulatory Visit: Payer: Self-pay

## 2020-12-11 ENCOUNTER — Encounter: Payer: Self-pay | Admitting: Emergency Medicine

## 2020-12-11 VITALS — BP 130/80 | HR 59 | Temp 98.1°F | Resp 16 | Ht 71.0 in | Wt 223.0 lb

## 2020-12-11 DIAGNOSIS — I5032 Chronic diastolic (congestive) heart failure: Secondary | ICD-10-CM | POA: Diagnosis not present

## 2020-12-11 DIAGNOSIS — I110 Hypertensive heart disease with heart failure: Secondary | ICD-10-CM | POA: Diagnosis not present

## 2020-12-11 DIAGNOSIS — M25562 Pain in left knee: Secondary | ICD-10-CM

## 2020-12-11 DIAGNOSIS — D696 Thrombocytopenia, unspecified: Secondary | ICD-10-CM

## 2020-12-11 DIAGNOSIS — F1014 Alcohol abuse with alcohol-induced mood disorder: Secondary | ICD-10-CM | POA: Diagnosis not present

## 2020-12-11 DIAGNOSIS — M25561 Pain in right knee: Secondary | ICD-10-CM

## 2020-12-11 DIAGNOSIS — I2510 Atherosclerotic heart disease of native coronary artery without angina pectoris: Secondary | ICD-10-CM

## 2020-12-11 DIAGNOSIS — Z8616 Personal history of COVID-19: Secondary | ICD-10-CM

## 2020-12-11 DIAGNOSIS — G8929 Other chronic pain: Secondary | ICD-10-CM

## 2020-12-11 DIAGNOSIS — Z9289 Personal history of other medical treatment: Secondary | ICD-10-CM | POA: Diagnosis not present

## 2020-12-11 DIAGNOSIS — Z1211 Encounter for screening for malignant neoplasm of colon: Secondary | ICD-10-CM | POA: Diagnosis not present

## 2020-12-11 MED ORDER — CHLORDIAZEPOXIDE HCL 25 MG PO CAPS
25.0000 mg | ORAL_CAPSULE | Freq: Two times a day (BID) | ORAL | 0 refills | Status: DC | PRN
Start: 2020-12-11 — End: 2021-06-16

## 2020-12-11 MED ORDER — ROSUVASTATIN CALCIUM 10 MG PO TABS: 10.0000 mg | ORAL_TABLET | Freq: Every day | ORAL | 3 refills | Status: DC

## 2020-12-11 NOTE — Telephone Encounter (Signed)
Error

## 2020-12-11 NOTE — Progress Notes (Addendum)
[name] 75 y.o.   Chief Complaint  Patient presents with  . Hypertension    Follow up 6 month  . Medication Refill    Chlordiazepoxide    HISTORY OF PRESENT ILLNESS: This is a 75 y.o. male with history of hypertension here for medication refill. Recently tested positive for COVID during visit to the ER on 12/01/2020.  Previously vaccinated.  Was mildly symptomatic. History of chronic EtOH abuse.  States he has been dry now for the past 13 days and going to detox program or scheduled to begin detox program.  Not sure. Has no other complaints or medical concerns today. Compliant with medications.  HPI   Prior to Admission medications   Medication Sig Start Date End Date Taking? Authorizing Provider  FLUoxetine (PROZAC) 20 MG capsule Take 20 mg by mouth daily.   Yes [provider]  furosemide (LASIX) 20 MG tablet Take 2 tablets (40 mg total) by mouth daily as needed. 09/03/20  Yes Rosalio Macadamia, NP  metoprolol succinate (TOPROL-XL) 25 MG 24 hr tablet Take 1 tablet (25 mg total) by mouth daily. 09/11/20  Yes Rosalio Macadamia, NP  pantoprazole (PROTONIX) 20 MG tablet Take 1 tablet (20 mg total) by mouth 2 (two) times daily before a meal. 04/29/20  Yes Fawze, Mina A, PA-C  albuterol (PROVENTIL HFA;VENTOLIN HFA) 108 (90 Base) MCG/ACT inhaler Inhale 2 puffs into the lungs every 4 (four) hours as needed for up to 30 days for wheezing or shortness of breath. 03/08/19 05/24/19  Ronnie Doss A, PA-C  chlordiazePOXIDE (LIBRIUM) 25 MG capsule Take 1 capsule (25 mg total) by mouth 2 (two) times daily as needed for anxiety. 12/11/20   Georgina Quint, MD  losartan (COZAAR) 50 MG tablet Take 50 mg by mouth daily.  04/29/20  [provider]    Allergies  Allergen Reactions  . Codeine Palpitations  . Vicodin [Hydrocodone-Acetaminophen] Nausea And Vomiting, Swelling and Palpitations    Patient Active Problem List   Diagnosis Date Noted  . Alcohol abuse with  alcohol-induced mood disorder (HCC) 06/16/2019  . PTSD (post-traumatic stress disorder) 06/16/2019  . Thrombocytopenia (HCC) 02/24/2019  . Chronic alcohol abuse 12/09/2015  . Coronary artery disease involving coronary bypass graft of native heart without angina pectoris 08/07/2014  . Essential hypertension 08/07/2014  . Hyperlipidemia 08/07/2014    Past Medical History:  Diagnosis Date  . Anginal pain (HCC) 01/2005  . Anxiety   . Arthritis    "knees"  . Chronic lower back pain   . Coronary artery disease   . Depression   . Hepatitis    "don't know what kind; they treated me for it; was a long time ago" (12/11/2018)  . High cholesterol   . History of bronchitis    "used to get it alot" (08/25/2012)  . Hypertension   . Panic attacks   . PTSD (post-traumatic stress disorder)    "have been treated in the past" (08/25/2012)    Past Surgical History:  Procedure Laterality Date  . CARDIAC CATHETERIZATION  01/2005  . CORONARY ARTERY BYPASS GRAFT  01/2005   CABG X3  . Shrapnel Left 1969   LLE; left lateral thumb (required grafting)  . VARICOSE VEIN SURGERY  1980's   LLE    Social History   Socioeconomic History  . Marital status: Divorced    Spouse name: Not on file  . Number of children: Not on file  . Years of education: Not on file  .  Highest education level: Not on file  Occupational History  . Not on file  Tobacco Use  . Smoking status: Former Smoker    Packs/day: 2.00    Years: 5.00    Pack years: 10.00  . Smokeless tobacco: Never Used  . Tobacco comment: 08/25/2012 "quit smoking cigarettes 40 yr ago"  Vaping Use  . Vaping Use: Never used  Substance and Sexual Activity  . Alcohol use: Yes    Alcohol/week: 60.0 standard drinks    Types: 60 Standard drinks or equivalent per week    Comment: 12/11/2018 "~ 1 pint of vodka/day"  . Drug use: Yes    Types: Marijuana    Comment: 08/25/2012 "last marijuana 10 years ago"  . Sexual activity: Yes  Other Topics Concern  .  Not on file  Social History Narrative  . Not on file   Social Determinants of Health   Financial Resource Strain: Not on file  Food Insecurity: Not on file  Transportation Needs: Not on file  Physical Activity: Not on file  Stress: Not on file  Social Connections: Not on file  Intimate Partner Violence: Not on file    Family History  Problem Relation Age of Onset  . Diabetes Mother   . Heart disease Father   . Cancer Brother      Review of Systems  Constitutional: Negative.  Negative for chills and fever.  HENT: Negative.  Negative for congestion and sore throat.   Respiratory: Negative.  Negative for cough and shortness of breath.   Cardiovascular: Negative.  Negative for chest pain and palpitations.  Gastrointestinal: Negative.  Negative for abdominal pain, diarrhea, nausea and vomiting.  Genitourinary: Negative.  Negative for dysuria and hematuria.  Skin: Negative.  Negative for rash.  Neurological: Negative.  Negative for dizziness and headaches.  All other systems reviewed and are negative.    Today's Vitals   12/11/20 1126  BP: (!) 165/70  Pulse: (!) 59  Resp: 16  Temp: 98.1 F (36.7 C)  TempSrc: Temporal  SpO2: 96%  Weight: 223 lb (101.2 kg)  Height: 5\' 11"  (1.803 m)   Body mass index is 31.1 kg/m.   Physical Exam Vitals reviewed.  Constitutional:      Appearance: Normal appearance.  HENT:     Head: Normocephalic.  Eyes:     Extraocular Movements: Extraocular movements intact.     Pupils: Pupils are equal, round, and reactive to light.  Cardiovascular:     Rate and Rhythm: Normal rate and regular rhythm.     Pulses: Normal pulses.     Heart sounds: Normal heart sounds.  Pulmonary:     Effort: Pulmonary effort is normal.     Breath sounds: Normal breath sounds.  Abdominal:     Palpations: Abdomen is soft.     Tenderness: There is no abdominal tenderness.  Musculoskeletal:        General: Normal range of motion.     Cervical back: Normal  range of motion and neck supple.  Skin:    General: Skin is warm and dry.     Capillary Refill: Capillary refill takes less than 2 seconds.  Neurological:     General: No focal deficit present.     Mental Status: He is alert and oriented to person, place, and time.  Psychiatric:        Mood and Affect: Mood normal.        Behavior: Behavior normal.      ASSESSMENT & PLAN: Clinically  stable.  No medical concerns other than chronic EtOH abuse identified during this visit. Patient states he is going through a detox program. Recent COVID infection.  Asymptomatic without complications. Continue present medications.  No changes. Follow-up in 6 months.  [name] was seen today for hypertension and medication refill.  Diagnoses and all orders for this visit:  Hypertensive heart disease with chronic diastolic congestive heart failure (HCC)  S/P alcohol detoxification -     chlordiazePOXIDE (LIBRIUM) 25 MG capsule; Take 1 capsule (25 mg total) by mouth 2 (two) times daily as needed for anxiety.  Chronic diastolic heart failure (HCC)  Alcohol abuse with alcohol-induced mood disorder (HCC)  Thrombocytopenia (HCC)  Atherosclerotic cardiovascular disease -     rosuvastatin (CRESTOR) 10 MG tablet; Take 1 tablet (10 mg total) by mouth daily.  History of COVID-19 Comments: Recent infection.  Asymptomatic.  Colon cancer screening -     Ambulatory referral to Gastroenterology     Patient Instructions   Health Maintenance After Age 74 After age 74, you are at a higher risk for certain long-term diseases and infections as well as injuries from falls. Falls are a major cause of broken bones and head injuries in people who are older than age 74. Getting regular preventive care can help to keep you healthy and well. Preventive care includes getting regular testing and making lifestyle changes as recommended by your health care provider. Talk with your health care provider about:  Which  screenings and tests you should have. A screening is a test that checks for a disease when you have no symptoms.  A diet and exercise plan that is right for you. What should I know about screenings and tests to prevent falls? Screening and testing are the best ways to find a health problem early. Early diagnosis and treatment give you the best chance of managing medical conditions that are common after age 74. Certain conditions and lifestyle choices may make you more likely to have a fall. Your health care provider may recommend:  Regular vision checks. Poor vision and conditions such as cataracts can make you more likely to have a fall. If you wear glasses, make sure to get your prescription updated if your vision changes.  Medicine review. Work with your health care provider to regularly review all of the medicines you are taking, including over-the-counter medicines. Ask your health care provider about any side effects that may make you more likely to have a fall. Tell your health care provider if any medicines that you take make you feel dizzy or sleepy.  Osteoporosis screening. Osteoporosis is a condition that causes the bones to get weaker. This can make the bones weak and cause them to break more easily.  Blood pressure screening. Blood pressure changes and medicines to control blood pressure can make you feel dizzy.  Strength and balance checks. Your health care provider may recommend certain tests to check your strength and balance while standing, walking, or changing positions.  Foot health exam. Foot pain and numbness, as well as not wearing proper footwear, can make you more likely to have a fall.  Depression screening. You may be more likely to have a fall if you have a fear of falling, feel emotionally low, or feel unable to do activities that you used to do.  Alcohol use screening. Using too much alcohol can affect your balance and may make you more likely to have a fall. What  actions can I take to lower my risk  of falls? General instructions  Talk with your health care provider about your risks for falling. Tell your health care provider if: ? You fall. Be sure to tell your health care provider about all falls, even ones that seem minor. ? You feel dizzy, sleepy, or off-balance.  Take over-the-counter and prescription medicines only as told by your health care provider. These include any supplements.  Eat a healthy diet and maintain a healthy weight. A healthy diet includes low-fat dairy products, low-fat (lean) meats, and fiber from whole grains, beans, and lots of fruits and vegetables. Home safety  Remove any tripping hazards, such as rugs, cords, and clutter.  Install safety equipment such as grab bars in bathrooms and safety rails on stairs.  Keep rooms and walkways well-lit. Activity  Follow a regular exercise program to stay fit. This will help you maintain your balance. Ask your health care provider what types of exercise are appropriate for you.  If you need a cane or walker, use it as recommended by your health care provider.  Wear supportive shoes that have nonskid soles.   Lifestyle  Do not drink alcohol if your health care provider tells you not to drink.  If you drink alcohol, limit how much you have: ? 0-1 drink a day for women. ? 0-2 drinks a day for men.  Be aware of how much alcohol is in your drink. In the [place] one drink equals one typical bottle of beer (12 oz), one-half glass of wine (5 oz), or one shot of hard liquor (1 oz).  Do not use any products that contain nicotine or tobacco, such as cigarettes and e-cigarettes. If you need help quitting, ask your health care provider. Summary  Having a healthy lifestyle and getting preventive care can help to protect your health and wellness after age 71.  Screening and testing are the best way to find a health problem early and help you avoid having a fall. Early diagnosis and  treatment give you the best chance for managing medical conditions that are more common for people who are older than age 35.  Falls are a major cause of broken bones and head injuries in people who are older than age 49. Take precautions to prevent a fall at home.  Work with your health care provider to learn what changes you can make to improve your health and wellness and to prevent falls. This information is not intended to replace advice given to you by your health care provider. Make sure you discuss any questions you have with your health care provider. Document Revised: 03/08/2019 Document Reviewed: 09/28/2017 Elsevier Patient Education  2021 Elsevier Inc.      Edwina Barth, MD [facility]

## 2020-12-11 NOTE — Patient Instructions (Signed)
Health Maintenance After Age 74 After age 74, you are at a higher risk for certain long-term diseases and infections as well as injuries from falls. Falls are a major cause of broken bones and head injuries in people who are older than age 74. Getting regular preventive care can help to keep you healthy and well. Preventive care includes getting regular testing and making lifestyle changes as recommended by your health care provider. Talk with your health care provider about:  Which screenings and tests you should have. A screening is a test that checks for a disease when you have no symptoms.  A diet and exercise plan that is right for you. What should I know about screenings and tests to prevent falls? Screening and testing are the best ways to find a health problem early. Early diagnosis and treatment give you the best chance of managing medical conditions that are common after age 74. Certain conditions and lifestyle choices may make you more likely to have a fall. Your health care provider may recommend:  Regular vision checks. Poor vision and conditions such as cataracts can make you more likely to have a fall. If you wear glasses, make sure to get your prescription updated if your vision changes.  Medicine review. Work with your health care provider to regularly review all of the medicines you are taking, including over-the-counter medicines. Ask your health care provider about any side effects that may make you more likely to have a fall. Tell your health care provider if any medicines that you take make you feel dizzy or sleepy.  Osteoporosis screening. Osteoporosis is a condition that causes the bones to get weaker. This can make the bones weak and cause them to break more easily.  Blood pressure screening. Blood pressure changes and medicines to control blood pressure can make you feel dizzy.  Strength and balance checks. Your health care provider may recommend certain tests to check your  strength and balance while standing, walking, or changing positions.  Foot health exam. Foot pain and numbness, as well as not wearing proper footwear, can make you more likely to have a fall.  Depression screening. You may be more likely to have a fall if you have a fear of falling, feel emotionally low, or feel unable to do activities that you used to do.  Alcohol use screening. Using too much alcohol can affect your balance and may make you more likely to have a fall. What actions can I take to lower my risk of falls? General instructions  Talk with your health care provider about your risks for falling. Tell your health care provider if: ? You fall. Be sure to tell your health care provider about all falls, even ones that seem minor. ? You feel dizzy, sleepy, or off-balance.  Take over-the-counter and prescription medicines only as told by your health care provider. These include any supplements.  Eat a healthy diet and maintain a healthy weight. A healthy diet includes low-fat dairy products, low-fat (lean) meats, and fiber from whole grains, beans, and lots of fruits and vegetables. Home safety  Remove any tripping hazards, such as rugs, cords, and clutter.  Install safety equipment such as grab bars in bathrooms and safety rails on stairs.  Keep rooms and walkways well-lit. Activity  Follow a regular exercise program to stay fit. This will help you maintain your balance. Ask your health care provider what types of exercise are appropriate for you.  If you need a cane or walker,   use it as recommended by your health care provider.  Wear supportive shoes that have nonskid soles.   Lifestyle  Do not drink alcohol if your health care provider tells you not to drink.  If you drink alcohol, limit how much you have: ? 0-1 drink a day for women. ? 0-2 drinks a day for men.  Be aware of how much alcohol is in your drink. In the [place] one drink equals one typical bottle of beer (12  oz), one-half glass of wine (5 oz), or one shot of hard liquor (1 oz).  Do not use any products that contain nicotine or tobacco, such as cigarettes and e-cigarettes. If you need help quitting, ask your health care provider. Summary  Having a healthy lifestyle and getting preventive care can help to protect your health and wellness after age 74.  Screening and testing are the best way to find a health problem early and help you avoid having a fall. Early diagnosis and treatment give you the best chance for managing medical conditions that are more common for people who are older than age 74.  Falls are a major cause of broken bones and head injuries in people who are older than age 74. Take precautions to prevent a fall at home.  Work with your health care provider to learn what changes you can make to improve your health and wellness and to prevent falls. This information is not intended to replace advice given to you by your health care provider. Make sure you discuss any questions you have with your health care provider. Document Revised: 03/08/2019 Document Reviewed: 09/28/2017 Elsevier Patient Education  2021 Elsevier Inc.  

## 2020-12-11 NOTE — Addendum Note (Signed)
Addended by: Evie Lacks on: 12/11/2020 12:29 PM   Modules accepted: Orders

## 2020-12-12 ENCOUNTER — Other Ambulatory Visit: Payer: Medicare Other

## 2020-12-12 DIAGNOSIS — Z20822 Contact with and (suspected) exposure to covid-19: Secondary | ICD-10-CM | POA: Diagnosis not present

## 2020-12-13 LAB — SARS-COV-2, NAA 2 DAY TAT

## 2020-12-13 LAB — NOVEL CORONAVIRUS, NAA: SARS-CoV-2, NAA: NOT DETECTED

## 2020-12-16 ENCOUNTER — Other Ambulatory Visit: Payer: Medicare Other

## 2020-12-23 ENCOUNTER — Telehealth: Payer: Self-pay | Admitting: Emergency Medicine

## 2020-12-23 DIAGNOSIS — G8929 Other chronic pain: Secondary | ICD-10-CM

## 2020-12-23 DIAGNOSIS — M25561 Pain in right knee: Secondary | ICD-10-CM

## 2020-12-23 NOTE — Telephone Encounter (Signed)
Pt called today adamant to get referral for home health after having gotten the letter at his [facility] 12/11/2020 stating he would benefit from this. Pt still needed an Order for physical therapy to be signed. Informed pt I would send this request to you ASAP to be done as he was very upset. Pt then let me know he also had [facility] on the line and they agreed they needed an order from the physician and that I could not sign this. Pt then states he will be following up with Darrick Huntsman to ensure this is done and quickly.    Then told me this had to be faxed to [phone] for [facility]   Please order PT or in home PT as advised your letter 12/11/2020

## 2020-12-24 NOTE — Addendum Note (Signed)
Addended by: Georg Ruddle A on: 12/24/2020 12:15 PM   Modules accepted: Orders

## 2020-12-24 NOTE — Telephone Encounter (Signed)
Please look into this and order home physical therapy please.  Thanks.

## 2020-12-25 NOTE — Telephone Encounter (Signed)
Ordered and signed

## 2020-12-25 NOTE — Telephone Encounter (Signed)
Referral sent on 12/24/2020 for PT through [facility] as patient requested. See message from Carin Primrose (referral).

## 2020-12-30 ENCOUNTER — Other Ambulatory Visit: Payer: Self-pay | Admitting: *Deleted

## 2020-12-30 NOTE — Patient Outreach (Addendum)
[facility]) Care Management  12/30/2020  [name] [facility] [birth year] [phone]  Initial telephone outreach for referral from primary care. MD requesting assistance for pt knee pain.  Sent message to Dr. Alvy Bimler to advise [facility] does not provide home health services including PT. Advised if this is the service he would like for his pt, recommended making the referral to a home health agency. Advised this NP would outreach [name] to attempt engagement for chronic disease management of CAD, HTN, Hyperlipidemia and HF.  Called pt, Unsuccessful, left message and requested a return call. I will call again tomorrow if I do not receive a return call.  Zara Council. Burgess Estelle, MSN, GNP-BC Gerontological Nurse Practitioner [facility] Care Management [phone]  [name] returned my call. He explained what he was hoping to receive which is PT and or an orthopedic evaluation. Explained that [facility] care management does not provide PT but I would like to work with him on self management of some of his other health issues. Explained the program. He is interested but is in school now and would like to revisit this invitation in a couple of weeks.  We agreed to talk again in 3 weeks, Tuesday, February 22nd. Will send him a new pt packet and ouor [facility] Disease Management Calendar.  Zara Council. Burgess Estelle, MSN, [facility] Gerontological Nurse Practitioner [facility] Care Management [phone]

## 2021-01-04 NOTE — Progress Notes (Signed)
Cardiology Office Note:    Date:  01/06/2021   ID:  [name], [name] 13-Aug-1946, MRN [id number]  PCP:  Georgina Quint, MD  Cardiologist:  Lesleigh Noe, MD   Referring MD: Georgina Quint, *   Chief Complaint  Patient presents with  . Hyperlipidemia  . Hypertension    History of Present Illness:    [name] is a 75 y.o. male with a hx of CAD s/p CABG x3, with early graft failure and re-do surgery (2006), HTN, HLD,PTSD,obesity with stable chest pain.  Squire is doing relatively well.  He has stopped smoking, discontinued alcohol use, he is worried about his liver, he is exercising without angina, and denies shortness of breath, orthopnea, and PND.  He is not having lower extremity edema.  His medication regimen no longer includes Cozaar.  He continues on Crestor 10 mg/day, Toprol-XL 25 mg/day, furosemide 20 mg as needed.  Past Medical History:  Diagnosis Date  . Anginal pain (HCC) 01/2005  . Anxiety   . Arthritis    "knees"  . Chronic lower back pain   . Coronary artery disease   . Depression   . Hepatitis    "don't know what kind; they treated me for it; was a long time ago" (12/11/2018)  . High cholesterol   . History of bronchitis    "used to get it alot" (08/25/2012)  . Hypertension   . Panic attacks   . PTSD (post-traumatic stress disorder)    "have been treated in the past" (08/25/2012)    Past Surgical History:  Procedure Laterality Date  . CARDIAC CATHETERIZATION  01/2005  . CORONARY ARTERY BYPASS GRAFT  01/2005   CABG X3  . Shrapnel Left 1969   LLE; left lateral thumb (required grafting)  . VARICOSE VEIN SURGERY  1980's   LLE    Current Medications: Current Meds  Medication Sig  . chlordiazePOXIDE (LIBRIUM) 25 MG capsule Take 1 capsule (25 mg total) by mouth 2 (two) times daily as needed for anxiety.  Marland Kitchen FLUoxetine (PROZAC) 20 MG capsule Take 20 mg by mouth daily.  . furosemide (LASIX) 20 MG tablet Take 2 tablets (40 mg total)  by mouth daily as needed.  . metoprolol succinate (TOPROL-XL) 25 MG 24 hr tablet Take 1 tablet (25 mg total) by mouth daily.  . pantoprazole (PROTONIX) 20 MG tablet Take 1 tablet (20 mg total) by mouth 2 (two) times daily before a meal.  . rosuvastatin (CRESTOR) 10 MG tablet Take 1 tablet (10 mg total) by mouth daily.     Allergies:   Codeine and Vicodin [hydrocodone-acetaminophen]   Social History   Socioeconomic History  . Marital status: Divorced    Spouse name: Not on file  . Number of children: Not on file  . Years of education: Not on file  . Highest education level: Not on file  Occupational History  . Not on file  Tobacco Use  . Smoking status: Former Smoker    Packs/day: 2.00    Years: 5.00    Pack years: 10.00  . Smokeless tobacco: Never Used  . Tobacco comment: 08/25/2012 "quit smoking cigarettes 40 yr ago"  Vaping Use  . Vaping Use: Never used  Substance and Sexual Activity  . Alcohol use: Yes    Alcohol/week: 60.0 standard drinks    Types: 60 Standard drinks or equivalent per week    Comment: 12/11/2018 "~ 1 pint of vodka/day"  . Drug use: Yes  Types: Marijuana    Comment: 08/25/2012 "last marijuana 10 years ago"  . Sexual activity: Yes  Other Topics Concern  . Not on file  Social History Narrative  . Not on file   Social Determinants of Health   Financial Resource Strain: Not on file  Food Insecurity: Not on file  Transportation Needs: Not on file  Physical Activity: Not on file  Stress: Not on file  Social Connections: Not on file     Family History: The patient's family history includes Cancer in his brother; Diabetes in his mother; Heart disease in his father.  ROS:   Please see the history of present illness.    He had Covid in December.  Denies chest pain today.  Had breakfast this morning.  Trying to eat grain and fruit more than carbohydrates.  All other systems reviewed and are negative.  EKGs/Labs/Other Studies Reviewed:    The  following studies were reviewed today: New data  EKG:  EKG performed December 01, 2020 demonstrates normal sinus rhythm, first-degree AV block with PR interval 222 ms, and T wave inversion V1 through V3.  The tracing is otherwise normal.  Recent Labs: 12/01/2020: ALT 53; BUN 15; Creatinine, Ser 1.02; Hemoglobin 14.8; Platelets 81; Potassium 3.7; Sodium 130  Recent Lipid Panel    Component Value Date/Time   CHOL 251 (H) 08/26/2012 0500   TRIG 282 (H) 08/26/2012 0500   HDL 36 (L) 08/26/2012 0500   CHOLHDL 7.0 08/26/2012 0500   VLDL 56 (H) 08/26/2012 0500   LDLCALC 159 (H) 08/26/2012 0500    Physical Exam:    VS:  BP (!) 148/72   Pulse (!) 52   Ht 5\' 11"  (1.803 m)   Wt 231 lb 3.2 oz (104.9 kg)   SpO2 98%   BMI 32.25 kg/m     Wt Readings from Last 3 Encounters:  01/06/21 231 lb 3.2 oz (104.9 kg)  12/11/20 223 lb (101.2 kg)  09/03/20 227 lb 3.2 oz (103.1 kg)     GEN: Abdominal obesity. No acute distress HEENT: Normal NECK: No JVD. LYMPHATICS: No lymphadenopathy CARDIAC: 2/6 right upper sternal systolic murmur. RRR S4 gallop, or edema. VASCULAR:  Normal Pulses. No bruits. RESPIRATORY:  Clear to auscultation without rales, wheezing or rhonchi  ABDOMEN: Soft, non-tender, non-distended, No pulsatile mass, MUSCULOSKELETAL: No deformity  SKIN: Warm and dry NEUROLOGIC:  Alert and oriented x 3 PSYCHIATRIC:  Normal affect   ASSESSMENT:    1. Coronary artery disease involving coronary bypass graft of native heart without angina pectoris   2. Essential hypertension   3. Chronic diastolic heart failure (HCC)   4. Other hyperlipidemia   5. Chronic alcohol abuse   6. Educated about COVID-19 virus infection   7. Alcoholic hepatitis, unspecified whether ascites present    PLAN:    In order of problems listed above:  1. Secondary prevention discussed.  Blood pressure is a little high today. 2. 2.4 g sodium diet, aerobic exercise, avoid alcohol, avoid nonsteroidal anti-inflammatory  therapy, and if still running above 130/80, may need to resume losartan. 3. No volume overload or evidence of failure. 4. Lipid panel will be obtained today.  Continue rosuvastatin 10 mg/day. 5. Discontinued. 6. Vaccinated.  Recent Covid infection.  Not boosted.  Practicing social distancing. 7. Liver enzymes be checked today.  Overall education and awareness concerning primary/secondary risk prevention was discussed in detail: LDL less than 70, hemoglobin A1c less than 7, blood pressure target less than 130/80 mmHg, >150 minutes  of moderate aerobic activity per week, avoidance of smoking, weight control (via diet and exercise), and continued surveillance/management of/for obstructive sleep apnea.    Medication Adjustments/Labs and Tests Ordered: Current medicines are reviewed at length with the patient today.  Concerns regarding medicines are outlined above.  Orders Placed This Encounter  Procedures  . Lipid panel  . Basic metabolic panel  . Hepatic function panel  . Hemoglobin A1c   No orders of the defined types were placed in this encounter.   Patient Instructions  Medication Instructions:  Your physician recommends that you continue on your current medications as directed. Please refer to the Current Medication list given to you today.  *If you need a refill on your cardiac medications before your next appointment, please call your pharmacy*   Lab Work: TODAY: LIPIDS,BMET, LIVER, HgbA1c If you have labs (blood work) drawn today and your tests are completely normal, you will receive your results only by: Marland Kitchen MyChart Message (if you have MyChart) OR . A paper copy in the mail If you have any lab test that is abnormal or we need to change your treatment, we will call you to review the results.   Testing/Procedures: none   Follow-Up: At [facility], you and your health needs are our priority.  As part of our continuing mission to provide you with exceptional heart care,  we have created designated Provider Care Teams.  These Care Teams include your primary Cardiologist (physician) and Advanced Practice Providers (APPs -  Physician Assistants and Nurse Practitioners) who all work together to provide you with the care you need, when you need it.  We recommend signing up for the patient portal called "MyChart".  Sign up information is provided on this After Visit Summary.  MyChart is used to connect with patients for Virtual Visits (Telemedicine).  Patients are able to view lab/test results, encounter notes, upcoming appointments, etc.  Non-urgent messages can be sent to your provider as well.   To learn more about what you can do with MyChart, go to [email or web address].    Your next appointment:   6 month(s)  The format for your next appointment:   In Person  Provider:   You may see Lesleigh Noe, MD or one of the following Advanced Practice Providers on your designated Care Team:    Georgie Chard, NP    Other Instructions Your physician recommends you follow a 2400mg  sodium diet per day, and complete 150 minutes of aerobic exercise per week.  Your target blood pressure is 130/80 or less.     Signed, , MD  01/06/2021 1:58 PM    [facility]

## 2021-01-06 ENCOUNTER — Encounter: Payer: Self-pay | Admitting: Interventional Cardiology

## 2021-01-06 ENCOUNTER — Other Ambulatory Visit: Payer: Self-pay

## 2021-01-06 ENCOUNTER — Ambulatory Visit (INDEPENDENT_AMBULATORY_CARE_PROVIDER_SITE_OTHER): Payer: Medicare Other | Admitting: Interventional Cardiology

## 2021-01-06 VITALS — BP 148/72 | HR 52 | Ht 71.0 in | Wt 231.2 lb

## 2021-01-06 DIAGNOSIS — E7849 Other hyperlipidemia: Secondary | ICD-10-CM

## 2021-01-06 DIAGNOSIS — I25810 Atherosclerosis of coronary artery bypass graft(s) without angina pectoris: Secondary | ICD-10-CM

## 2021-01-06 DIAGNOSIS — I5032 Chronic diastolic (congestive) heart failure: Secondary | ICD-10-CM

## 2021-01-06 DIAGNOSIS — Z7189 Other specified counseling: Secondary | ICD-10-CM

## 2021-01-06 DIAGNOSIS — F1010 Alcohol abuse, uncomplicated: Secondary | ICD-10-CM

## 2021-01-06 DIAGNOSIS — K7010 Alcoholic hepatitis without ascites: Secondary | ICD-10-CM

## 2021-01-06 DIAGNOSIS — I10 Essential (primary) hypertension: Secondary | ICD-10-CM

## 2021-01-06 NOTE — Patient Instructions (Addendum)
Medication Instructions:  Your physician recommends that you continue on your current medications as directed. Please refer to the Current Medication list given to you today.  *If you need a refill on your cardiac medications before your next appointment, please call your pharmacy*   Lab Work: TODAY: LIPIDS,BMET, LIVER, HgbA1c If you have labs (blood work) drawn today and your tests are completely normal, you will receive your results only by: Marland Kitchen MyChart Message (if you have MyChart) OR . A paper copy in the mail If you have any lab test that is abnormal or we need to change your treatment, we will call you to review the results.   Testing/Procedures: none   Follow-Up: At [facility], you and your health needs are our priority.  As part of our continuing mission to provide you with exceptional heart care, we have created designated Provider Care Teams.  These Care Teams include your primary Cardiologist (physician) and Advanced Practice Providers (APPs -  Physician Assistants and Nurse Practitioners) who all work together to provide you with the care you need, when you need it.  We recommend signing up for the patient portal called "MyChart".  Sign up information is provided on this After Visit Summary.  MyChart is used to connect with patients for Virtual Visits (Telemedicine).  Patients are able to view lab/test results, encounter notes, upcoming appointments, etc.  Non-urgent messages can be sent to your provider as well.   To learn more about what you can do with MyChart, go to [email or web address].    Your next appointment:   6 month(s)  The format for your next appointment:   In Person  Provider:   You may see Lesleigh Noe, MD or one of the following Advanced Practice Providers on your designated Care Team:    Georgie Chard, NP    Other Instructions Your physician recommends you follow a 2400mg  sodium diet per day, and complete 150 minutes of aerobic exercise per  week.  Your target blood pressure is 130/80 or less.

## 2021-01-07 LAB — BASIC METABOLIC PANEL
BUN/Creatinine Ratio: 18 (ref 10–24)
BUN: 19 mg/dL (ref 8–27)
CO2: 20 mmol/L (ref 20–29)
Calcium: 9.7 mg/dL (ref 8.6–10.2)
Chloride: 103 mmol/L (ref 96–106)
Creatinine, Ser: 1.03 mg/dL (ref 0.76–1.27)
GFR calc Af Amer: 83 mL/min/{1.73_m2} (ref >59)
GFR calc non Af Amer: 72 mL/min/{1.73_m2} (ref >59)
Glucose: 87 mg/dL (ref 65–99)
Potassium: 4.4 mmol/L (ref 3.5–5.2)
Sodium: 138 mmol/L (ref 134–144)

## 2021-01-07 LAB — LIPID PANEL
Chol/HDL Ratio: 4.9 ratio (ref 0.0–5.0)
Cholesterol, Total: 212 mg/dL — ABNORMAL HIGH (ref 100–199)
HDL: 43 mg/dL (ref >39)
LDL Chol Calc (NIH): 133 mg/dL — ABNORMAL HIGH (ref 0–99)
Triglycerides: 201 mg/dL — ABNORMAL HIGH (ref 0–149)
VLDL Cholesterol Cal: 36 mg/dL (ref 5–40)

## 2021-01-07 LAB — HEPATIC FUNCTION PANEL
ALT: 41 IU/L (ref 0–44)
AST: 40 IU/L (ref 0–40)
Albumin: 4.5 g/dL (ref 3.7–4.7)
Alkaline Phosphatase: 62 IU/L (ref 44–121)
Bilirubin Total: 0.5 mg/dL (ref 0.0–1.2)
Bilirubin, Direct: 0.19 mg/dL (ref 0.00–0.40)
Total Protein: 9.1 g/dL — ABNORMAL HIGH (ref 6.0–8.5)

## 2021-01-07 LAB — HEMOGLOBIN A1C
Est. average glucose Bld gHb Est-mCnc: 117 mg/dL
Hgb A1c MFr Bld: 5.7 % — ABNORMAL HIGH (ref 4.8–5.6)

## 2021-01-12 ENCOUNTER — Other Ambulatory Visit: Payer: Self-pay | Admitting: *Deleted

## 2021-01-12 DIAGNOSIS — E7849 Other hyperlipidemia: Secondary | ICD-10-CM

## 2021-01-20 ENCOUNTER — Other Ambulatory Visit: Payer: Self-pay | Admitting: *Deleted

## 2021-01-20 NOTE — Patient Outreach (Signed)
[facility]) Care Management  01/20/2021  [name] Mar 08, 1946 [id number]  Telephone outreach to request engagement for care management services.  [name] has reviewed our brochure and services and reports he is watching his diet, eats a well balanced heart healthy diet. He works out several times a week. He keeps up with his medical screenings and and routine MD visits. He adds that he feels he is doing what he should be doing to reduce his chances for cardiac events and does not need our services at this time.  Zara Council. Burgess Estelle, MSN, [facility] Geront[facility] Nurse Practitioner [facility] Care Management [phone]

## 2021-02-24 DIAGNOSIS — H1013 Acute atopic conjunctivitis, bilateral: Secondary | ICD-10-CM | POA: Diagnosis not present

## 2021-02-26 ENCOUNTER — Other Ambulatory Visit: Payer: Medicare Other | Admitting: *Deleted

## 2021-02-26 ENCOUNTER — Other Ambulatory Visit: Payer: Self-pay

## 2021-02-26 DIAGNOSIS — E7849 Other hyperlipidemia: Secondary | ICD-10-CM | POA: Diagnosis not present

## 2021-02-26 LAB — HEPATIC FUNCTION PANEL
ALT: 42 IU/L (ref 0–44)
AST: 41 IU/L — ABNORMAL HIGH (ref 0–40)
Albumin: 4.3 g/dL (ref 3.7–4.7)
Alkaline Phosphatase: 52 IU/L (ref 44–121)
Bilirubin Total: 0.7 mg/dL (ref 0.0–1.2)
Bilirubin, Direct: 0.2 mg/dL (ref 0.00–0.40)
Total Protein: 8.9 g/dL — ABNORMAL HIGH (ref 6.0–8.5)

## 2021-02-26 LAB — LIPID PANEL
Chol/HDL Ratio: 5.1 ratio — ABNORMAL HIGH (ref 0.0–5.0)
Cholesterol, Total: 199 mg/dL (ref 100–199)
HDL: 39 mg/dL — ABNORMAL LOW (ref >39)
LDL Chol Calc (NIH): 130 mg/dL — ABNORMAL HIGH (ref 0–99)
Triglycerides: 165 mg/dL — ABNORMAL HIGH (ref 0–149)
VLDL Cholesterol Cal: 30 mg/dL (ref 5–40)

## 2021-02-27 ENCOUNTER — Telehealth: Payer: Self-pay | Admitting: Interventional Cardiology

## 2021-02-27 ENCOUNTER — Encounter: Payer: Self-pay | Admitting: Interventional Cardiology

## 2021-02-27 NOTE — Telephone Encounter (Signed)
Spoke with pt and reviewed results and recommendations per Dr. Katrinka Blazing.  Pt mentioned that he is having myalgias at current dose of Crestor.  Dr. Katrinka Blazing said ok to refer to Lipid Clinic.  Scheduled pt to see them on 4/21.  Pt appreciative for call.

## 2021-02-27 NOTE — Telephone Encounter (Signed)
Patient was returning phone call 

## 2021-02-27 NOTE — Telephone Encounter (Signed)
This encounter was created in error - please disregard.

## 2021-03-18 ENCOUNTER — Encounter: Payer: Medicare Other | Admitting: Gastroenterology

## 2021-03-19 ENCOUNTER — Other Ambulatory Visit: Payer: Self-pay

## 2021-03-19 ENCOUNTER — Ambulatory Visit (INDEPENDENT_AMBULATORY_CARE_PROVIDER_SITE_OTHER): Payer: Medicare Other | Admitting: Pharmacist

## 2021-03-19 ENCOUNTER — Telehealth: Payer: Self-pay

## 2021-03-19 DIAGNOSIS — E7849 Other hyperlipidemia: Secondary | ICD-10-CM | POA: Diagnosis not present

## 2021-03-19 MED ORDER — REPATHA SURECLICK 140 MG/ML ~~LOC~~ SOAJ: 140.0000 mg | SUBCUTANEOUS | 11 refills | Status: DC

## 2021-03-19 NOTE — Addendum Note (Signed)
Addended by: Eather Colas on: 03/19/2021 04:41 PM   Modules accepted: Orders

## 2021-03-19 NOTE — Telephone Encounter (Signed)
Called and spoke w/pt regarding repatha. I told him that I am in process of a pa but I was able to get the [employer] grant approved and emailed and approval copy to the pt so that they can print it off and take it to the pharmacy to get the medication free of charge. I told the pt that I would call him once the repatha is approved so we know where to send rx. Pt voiced gratitude and understanding.

## 2021-03-19 NOTE — Progress Notes (Signed)
Patient ID: [name]                 DOB: 1946/10/01                    MRN: [id number]     HPI: [name] is a 75 y.o. male patient referred to lipid clinic by Dr Katrinka Blazing. PMH is significant for CAD s/p CABG x3 with early graft failure and re-do surgery in 2006, HTN, HLD, PTSD, and obesity. Pt called into clinic 02/27/21 reporting myalgias on rosuvastatin 10mg  daily and was referred to lipid clinic to discuss other options.  Pt presents today in good spirits. Reports tolerating rosuvastatin 10mg  daily ok but has some cramping and doesn't want to increase his dose since he thinks the cramping will become worse. Tries to stay active at the gym, goes to [employer] a lot.  Current Medications: rosuvastatin 10mg  daily - some myalgias Risk Factors: CAD s/p CABG LDL goal: 70mg /dL  Diet: Breakfast - bacon, egg whites, bran cereal. Does like [employer], goes once a day sometimes. Bakes food at home, loves salmon.  Exercise: Working out at [employer]  Family History: Cancer in his brother; Diabetes in his mother; Heart disease in his father.  Social History: Former smoker 2 PPD for 5 years, quit over 40 years ago. Discontinued alcohol use, used to drink 1 pint of vodka/day.  Labs: 02/26/21: TC 199, TG 165, HDL 39, LDL 130, LFTs normal (rosuvastatin 10mg  daily)  Past Medical History:  Diagnosis Date  . Anginal pain (HCC) 01/2005  . Anxiety   . Arthritis    "knees"  . Chronic lower back pain   . Coronary artery disease   . Depression   . Hepatitis    "don't know what kind; they treated me for it; was a long time ago" (12/11/2018)  . High cholesterol   . History of bronchitis    "used to get it alot" (08/25/2012)  . Hypertension   . Panic attacks   . PTSD (post-traumatic stress disorder)    "have been treated in the past" (08/25/2012)    Current Outpatient Medications on File Prior to Visit  Medication Sig Dispense Refill  . chlordiazePOXIDE (LIBRIUM) 25 MG capsule Take 1  capsule (25 mg total) by mouth 2 (two) times daily as needed for anxiety. 21 capsule 0  . FLUoxetine (PROZAC) 20 MG capsule Take 20 mg by mouth daily.    . furosemide (LASIX) 20 MG tablet Take 2 tablets (40 mg total) by mouth daily as needed. 90 tablet 0  . metoprolol succinate (TOPROL-XL) 25 MG 24 hr tablet Take 1 tablet (25 mg total) by mouth daily. 90 tablet 3  . pantoprazole (PROTONIX) 20 MG tablet Take 1 tablet (20 mg total) by mouth 2 (two) times daily before a meal. 60 tablet 0  . rosuvastatin (CRESTOR) 10 MG tablet Take 1 tablet (10 mg total) by mouth daily. 90 tablet 3  . [DISCONTINUED] losartan (COZAAR) 50 MG tablet Take 50 mg by mouth daily.     No current facility-administered medications on file prior to visit.    Allergies  Allergen Reactions  . Codeine Palpitations  . Vicodin [Hydrocodone-Acetaminophen] Nausea And Vomiting, Swelling and Palpitations    Assessment/Plan:  1. Hyperlipidemia - LDL 130 on rosuvastatin 10mg  daily, above goal 70mg /dL given history of CABG. Pt experiencing some myalgias on rosuvastatin. He's ok continuing current dose but doesn't think he can increase his dose  further. Discussed PCSK9i therapy with pt as ezetimibe will not bring LDL to goal. Pt is interested in trying Repatha. Will submit prior authorization and [employer] for pt (income 14,4000 for 1 person in household, SSN [id number]). Follow up labs scheduled in June.   [name], PharmD, BCACP, CPP [facility] [address], [place], [place] [address] Phone: [phone]; Fax: [phone] 03/19/2021 3:58 PM

## 2021-03-19 NOTE — Patient Instructions (Addendum)
It was nice to meet you today!  Your LDL is 130 and your goal is < 70  I'll submit information to your insurance to see if they will cover Repatha injections. These are given every 2 weeks in the fatty tissue of your stomach and lower your LDL by 60%. Store the medication in the fridge until you're ready to give a dose.  I'll also apply for a grant through the [facility] which will cover your $47 copay for Repatha  We'll recheck your cholesterol in 2 months - come in any time after 7:30am on Tuesday, June 28th

## 2021-03-23 MED ORDER — REPATHA SURECLICK 140 MG/ML ~~LOC~~ SOAJ: 140.0000 mg | SUBCUTANEOUS | 11 refills | Status: DC

## 2021-03-23 NOTE — Addendum Note (Signed)
Addended by: Eather Colas on: 03/23/2021 02:12 PM   Modules accepted: Orders

## 2021-03-23 NOTE — Telephone Encounter (Signed)
Called and spoke w/pt regarding the approval of the repatha [facility] rx sent, pt instructed to complete the fasting labs June 28th as discussed previously. Pt voiced undersanding

## 2021-03-24 ENCOUNTER — Telehealth: Payer: Self-pay | Admitting: Interventional Cardiology

## 2021-03-24 NOTE — Telephone Encounter (Signed)
Patient should continue his current dose of rosuvastatin since he is tolerating it as well as starting the Repatha

## 2021-03-24 NOTE — Telephone Encounter (Signed)
Spoke with pt and made him aware to continue both medications.  Pt agreeable to plan.

## 2021-03-24 NOTE — Telephone Encounter (Signed)
Pt c/o medication issue:  1. Name of Medication: rosuvastatin (CRESTOR) 10 MG tablet  Evolocumab (REPATHA SURECLICK) 140 MG/ML SOAJ     2. How are you currently taking this medication (dosage and times per day)?   3. Are you having a reaction (difficulty breathing--STAT)? No   4. What is your medication issue? Pt is calling with questions regarding these medications.PT wants to confirm he needs to stop taking the rosuvatin now that he is repatha.Please advise

## 2021-03-30 ENCOUNTER — Other Ambulatory Visit (HOSPITAL_COMMUNITY): Payer: Self-pay

## 2021-04-23 ENCOUNTER — Telehealth: Payer: Self-pay | Admitting: Interventional Cardiology

## 2021-04-23 DIAGNOSIS — I5032 Chronic diastolic (congestive) heart failure: Secondary | ICD-10-CM

## 2021-04-23 DIAGNOSIS — R600 Localized edema: Secondary | ICD-10-CM

## 2021-04-23 MED ORDER — FUROSEMIDE 20 MG PO TABS: 40.0000 mg | ORAL_TABLET | Freq: Every day | ORAL | 1 refills | Status: DC | PRN

## 2021-04-23 NOTE — Telephone Encounter (Signed)
Agree with recommendation

## 2021-04-23 NOTE — Telephone Encounter (Signed)
Patient called to say fluid is starting to build back up, he had taken the medication for it but it dont seem to be working. Requesting that the nurse give him a call.

## 2021-04-23 NOTE — Telephone Encounter (Signed)
Pt states swelling has worsened over the last 2-3 weeks.  Has taken Furosemide 20mg  intermittently.  Elevation helps with swelling.  Occasionally SOB but not consistent.  Pt still able to work out at .  Pt has returned to work and admits to eating out a lot when he's working.  States he's been eating a lot of fast food, especially chicken strips from [employer].  Weight is 236lbs.  Pt down to 1 tablet of Furosemide. Advised I will send in a new prescription and to cut back on fast food.  Advised per prescription, he can take Furosemide 40mg  PRN for swelling.  Advised to take 40mg  2-3 days and then go to one tablet until back to baseline.  Advised to increase Potassium rich foods while taking this much Furosemide.  Pt will come for BMET on 04/29/2021.  Advised pt monitor BP, especially if he develops dizziness or feels like he is going to pass out as it may be getting too low.  BP usually slightly elevated.  Pt verbalized understanding and was appreciative for call.

## 2021-04-30 ENCOUNTER — Other Ambulatory Visit: Payer: Medicare Other | Admitting: *Deleted

## 2021-04-30 ENCOUNTER — Other Ambulatory Visit: Payer: Self-pay

## 2021-04-30 ENCOUNTER — Other Ambulatory Visit: Payer: Medicare Other

## 2021-04-30 DIAGNOSIS — I5032 Chronic diastolic (congestive) heart failure: Secondary | ICD-10-CM | POA: Diagnosis not present

## 2021-04-30 DIAGNOSIS — R600 Localized edema: Secondary | ICD-10-CM | POA: Diagnosis not present

## 2021-04-30 LAB — BASIC METABOLIC PANEL
BUN/Creatinine Ratio: 17 (ref 10–24)
BUN: 18 mg/dL (ref 8–27)
CO2: 21 mmol/L (ref 20–29)
Calcium: 9.6 mg/dL (ref 8.6–10.2)
Chloride: 102 mmol/L (ref 96–106)
Creatinine, Ser: 1.06 mg/dL (ref 0.76–1.27)
Glucose: 97 mg/dL (ref 65–99)
Potassium: 4.4 mmol/L (ref 3.5–5.2)
Sodium: 137 mmol/L (ref 134–144)
eGFR: 74 mL/min/{1.73_m2} (ref >59)

## 2021-05-01 ENCOUNTER — Other Ambulatory Visit: Payer: Medicare Other

## 2021-05-12 DIAGNOSIS — M170 Bilateral primary osteoarthritis of knee: Secondary | ICD-10-CM | POA: Diagnosis not present

## 2021-05-12 DIAGNOSIS — M25561 Pain in right knee: Secondary | ICD-10-CM | POA: Diagnosis not present

## 2021-05-12 DIAGNOSIS — M25562 Pain in left knee: Secondary | ICD-10-CM | POA: Diagnosis not present

## 2021-05-17 ENCOUNTER — Emergency Department (HOSPITAL_COMMUNITY): Payer: Medicare Other

## 2021-05-17 ENCOUNTER — Other Ambulatory Visit: Payer: Self-pay

## 2021-05-17 ENCOUNTER — Emergency Department (HOSPITAL_COMMUNITY)
Admission: EM | Admit: 2021-05-17 | Discharge: 2021-05-17 | Disposition: A | Discharge status: Home / Self Care | Payer: Medicare Other | Attending: Emergency Medicine | Admitting: Emergency Medicine

## 2021-05-17 ENCOUNTER — Encounter (HOSPITAL_COMMUNITY): Payer: Self-pay

## 2021-05-17 DIAGNOSIS — Z8616 Personal history of COVID-19: Secondary | ICD-10-CM | POA: Insufficient documentation

## 2021-05-17 DIAGNOSIS — J101 Influenza due to other identified influenza virus with other respiratory manifestations: Secondary | ICD-10-CM

## 2021-05-17 DIAGNOSIS — R0602 Shortness of breath: Secondary | ICD-10-CM | POA: Diagnosis not present

## 2021-05-17 DIAGNOSIS — I2510 Atherosclerotic heart disease of native coronary artery without angina pectoris: Secondary | ICD-10-CM | POA: Insufficient documentation

## 2021-05-17 DIAGNOSIS — J1089 Influenza due to other identified influenza virus with other manifestations: Secondary | ICD-10-CM | POA: Insufficient documentation

## 2021-05-17 DIAGNOSIS — Z79899 Other long term (current) drug therapy: Secondary | ICD-10-CM | POA: Diagnosis not present

## 2021-05-17 DIAGNOSIS — M7989 Other specified soft tissue disorders: Secondary | ICD-10-CM | POA: Insufficient documentation

## 2021-05-17 DIAGNOSIS — Z951 Presence of aortocoronary bypass graft: Secondary | ICD-10-CM | POA: Insufficient documentation

## 2021-05-17 DIAGNOSIS — Z87891 Personal history of nicotine dependence: Secondary | ICD-10-CM | POA: Insufficient documentation

## 2021-05-17 DIAGNOSIS — I110 Hypertensive heart disease with heart failure: Secondary | ICD-10-CM | POA: Diagnosis not present

## 2021-05-17 DIAGNOSIS — R059 Cough, unspecified: Secondary | ICD-10-CM

## 2021-05-17 DIAGNOSIS — H6692 Otitis media, unspecified, left ear: Secondary | ICD-10-CM | POA: Insufficient documentation

## 2021-05-17 DIAGNOSIS — Z20822 Contact with and (suspected) exposure to covid-19: Secondary | ICD-10-CM | POA: Diagnosis not present

## 2021-05-17 DIAGNOSIS — J9811 Atelectasis: Secondary | ICD-10-CM | POA: Diagnosis not present

## 2021-05-17 DIAGNOSIS — I5032 Chronic diastolic (congestive) heart failure: Secondary | ICD-10-CM | POA: Insufficient documentation

## 2021-05-17 DIAGNOSIS — U071 COVID-19: Secondary | ICD-10-CM | POA: Diagnosis not present

## 2021-05-17 LAB — CBC WITH DIFFERENTIAL/PLATELET
Abs Immature Granulocytes: 0.02 10*3/uL (ref 0.00–0.07)
Basophils Absolute: 0 10*3/uL (ref 0.0–0.1)
Basophils Relative: 0 %
Eosinophils Absolute: 0 10*3/uL (ref 0.0–0.5)
Eosinophils Relative: 0 %
HCT: 44.2 % (ref 39.0–52.0)
Hemoglobin: 14 g/dL (ref 13.0–17.0)
Immature Granulocytes: 0 %
Lymphocytes Relative: 18 %
Lymphs Abs: 1.3 10*3/uL (ref 0.7–4.0)
MCH: 29.2 pg (ref 26.0–34.0)
MCHC: 31.7 g/dL (ref 30.0–36.0)
MCV: 92.3 fL (ref 80.0–100.0)
Monocytes Absolute: 0.6 10*3/uL (ref 0.1–1.0)
Monocytes Relative: 9 %
Neutro Abs: 5.1 10*3/uL (ref 1.7–7.7)
Neutrophils Relative %: 73 %
Platelets: 85 10*3/uL — ABNORMAL LOW (ref 150–400)
RBC: 4.79 MIL/uL (ref 4.22–5.81)
RDW: 12.7 % (ref 11.5–15.5)
WBC: 7 10*3/uL (ref 4.0–10.5)
nRBC: 0 % (ref 0.0–0.2)

## 2021-05-17 LAB — BASIC METABOLIC PANEL
Anion gap: 8 (ref 5–15)
BUN: 15 mg/dL (ref 8–23)
CO2: 24 mmol/L (ref 22–32)
Calcium: 9.3 mg/dL (ref 8.9–10.3)
Chloride: 103 mmol/L (ref 98–111)
Creatinine, Ser: 1.29 mg/dL — ABNORMAL HIGH (ref 0.61–1.24)
GFR, Estimated: 59 mL/min — ABNORMAL LOW (ref >60)
Glucose, Bld: 109 mg/dL — ABNORMAL HIGH (ref 70–99)
Potassium: 3.6 mmol/L (ref 3.5–5.1)
Sodium: 135 mmol/L (ref 135–145)

## 2021-05-17 LAB — TROPONIN I (HIGH SENSITIVITY): Troponin I (High Sensitivity): 8 ng/L (ref <18)

## 2021-05-17 LAB — RESP PANEL BY RT-PCR (FLU A&B, COVID) ARPGX2
Influenza A by PCR: POSITIVE — AB
Influenza B by PCR: NEGATIVE
SARS Coronavirus 2 by RT PCR: NEGATIVE

## 2021-05-17 LAB — BRAIN NATRIURETIC PEPTIDE: B Natriuretic Peptide: 135.4 pg/mL — ABNORMAL HIGH (ref 0.0–100.0)

## 2021-05-17 MED ORDER — ALBUTEROL SULFATE HFA 108 (90 BASE) MCG/ACT IN AERS: 2.0000 | INHALATION_SPRAY | RESPIRATORY_TRACT | Status: DC | PRN

## 2021-05-17 MED ORDER — ACETAMINOPHEN 325 MG PO TABS
650.0000 mg | ORAL_TABLET | Freq: Once | ORAL | Status: AC
  Administered 2021-05-17: 650 mg via ORAL
  Filled 2021-05-17: qty 2

## 2021-05-17 MED ORDER — SODIUM CHLORIDE 0.9 % IV BOLUS
500.0000 mL | Freq: Once | INTRAVENOUS | Status: AC
  Administered 2021-05-17: 500 mL via INTRAVENOUS

## 2021-05-17 MED ORDER — AMOXICILLIN 500 MG PO CAPS: 500.0000 mg | ORAL_CAPSULE | Freq: Two times a day (BID) | ORAL | 0 refills | Status: AC

## 2021-05-17 MED ORDER — BENZONATATE 100 MG PO CAPS: 100.0000 mg | ORAL_CAPSULE | Freq: Three times a day (TID) | ORAL | 0 refills | Status: DC

## 2021-05-17 NOTE — ED Provider Notes (Signed)
[facility] EMERGENCY DEPARTMENT Provider Note   CSN: [id number] Arrival date & time: 05/17/21  1013     History Chief Complaint  Patient presents with   Cough         Leg Swelling    [name] is a 75 y.o. male with past medical history significant for chronic diastolic heart failure, alcohol abuse, thrombocytopenia, arthritis, hepatitis, hypertension, PTSD.  Patient is not anticoagulated.  HPI Patient presents to emergency department today with chief complaint of cough and leg swelling.  Patient states he has had a nonproductive cough for the last x1 month and he noticed that x3 days ago the cough worsened. Cough is productive now. Multiple of his coworkers have tested positive for COVID recently.  Patient states he himself had COVID back in January, he did not require hospitalization and felt like his case was overall pretty mild.  Patient states his legs have been swelling for several months.  He is supposed to take 40 mg of Lasix daily however states that he thinks it makes him very dehydrated so he only takes 20 mg daily. He states last night he noticed his legs were 3x the size they are supposed to be. When he work up this morning they were normal size after elevating them in bed. He works at a [employer], [employer] and states when standing on his feet for several hours he has worsening swelling in bilateral lower extremities.  At night when he goes home and but does like that the swelling resolves.  He does endorse shortness of breath recently.  He is unsure exactly how long it has been going on for.  He states he is unable to walk as far as usual.  He denies any associated chest pain.  He does endorse nasal congestion and left ear pain.  He states this feels like his left ear is congested.  He is experiencing nasal congestion as well.  Besides his daily medications he has not taken any over-the-counter medications prior to arrival. Patient denies any fever, chills,  diaphoresis, wheezing, back pain, abdominal pain, nausea, rash, urinary symptoms, diarrhea.     Past Medical History:  Diagnosis Date   Anginal pain (HCC) 01/2005   Anxiety    Arthritis    "knees"   Chronic lower back pain    Coronary artery disease    Depression    Hepatitis    "don't know what kind; they treated me for it; was a long time ago" (12/11/2018)   High cholesterol    History of bronchitis    "used to get it alot" (08/25/2012)   Hypertension    Panic attacks    PTSD (post-traumatic stress disorder)    "have been treated in the past" (08/25/2012)    Patient Active Problem List   Diagnosis Date Noted   Chronic diastolic heart failure (HCC) 12/11/2020   Alcohol abuse with alcohol-induced mood disorder (HCC) 06/16/2019   PTSD (post-traumatic stress disorder) 06/16/2019   Thrombocytopenia (HCC) 02/24/2019   Chronic alcohol abuse 12/09/2015   Coronary artery disease involving coronary bypass graft of native heart without angina pectoris 08/07/2014   Essential hypertension 08/07/2014   Hyperlipidemia 08/07/2014    Past Surgical History:  Procedure Laterality Date   CARDIAC CATHETERIZATION  01/2005   CORONARY ARTERY BYPASS GRAFT  01/2005   CABG X3   Shrapnel Left 1969   LLE; left lateral thumb (required grafting)   VARICOSE VEIN SURGERY  1980's   LLE  Family History  Problem Relation Age of Onset   Diabetes Mother    Heart disease Father    Cancer Brother     Social History   Tobacco Use   Smoking status: Former    Packs/day: 2.00    Years: 5.00    Pack years: 10.00    Types: Cigarettes   Smokeless tobacco: Never   Tobacco comments:    08/25/2012 "quit smoking cigarettes 40 yr ago"  Vaping Use   Vaping Use: Never used  Substance Use Topics   Alcohol use: Yes    Alcohol/week: 60.0 standard drinks    Types: 60 Standard drinks or equivalent per week    Comment: 12/11/2018 "~ 1 pint of vodka/day"   Drug use: Yes    Types: Marijuana    Comment:  08/25/2012 "last marijuana 10 years ago"    Home Medications Prior to Admission medications   Medication Sig Start Date End Date Taking? Authorizing Provider  amoxicillin (AMOXIL) 500 MG capsule Take 1 capsule (500 mg total) by mouth 2 (two) times daily for 7 days. 05/17/21 05/24/21 Yes Walisiewicz, Davide Risdon E, PA-C  benzonatate (TESSALON) 100 MG capsule Take 1 capsule (100 mg total) by mouth every 8 (eight) hours. 05/17/21  Yes Walisiewicz, Jule Whitsel E, PA-C  chlordiazePOXIDE (LIBRIUM) 25 MG capsule Take 1 capsule (25 mg total) by mouth 2 (two) times daily as needed for anxiety. 12/11/20   Georgina Quint, MD  Evolocumab (REPATHA SURECLICK) 140 MG/ML SOAJ Inject 140 mg into the skin every 14 (fourteen) days. 03/23/21   Lyn Records, MD  FLUoxetine (PROZAC) 20 MG capsule Take 20 mg by mouth daily.    [provider]  furosemide (LASIX) 20 MG tablet Take 2 tablets (40 mg total) by mouth daily as needed. 04/23/21   Lyn Records, MD  metoprolol succinate (TOPROL-XL) 25 MG 24 hr tablet Take 1 tablet (25 mg total) by mouth daily. 09/11/20   Rosalio Macadamia, NP  pantoprazole (PROTONIX) 20 MG tablet Take 1 tablet (20 mg total) by mouth 2 (two) times daily before a meal. 04/29/20   Fawze, Mina A, PA-C  rosuvastatin (CRESTOR) 10 MG tablet Take 1 tablet (10 mg total) by mouth daily. 12/11/20   Georgina Quint, MD  losartan (COZAAR) 50 MG tablet Take 50 mg by mouth daily.  04/29/20  [provider]    Allergies    Codeine and Vicodin [hydrocodone-acetaminophen]  Review of Systems   Review of Systems All other systems are reviewed and are negative for acute change except as noted in the HPI.  Physical Exam Updated Vital Signs BP (!) 164/60 (BP Location: Right Arm)   Pulse 66   Temp (!) 100.9 F (38.3 C) (Oral)   Resp (!) 24   SpO2 99%   Physical Exam Vitals and nursing note reviewed.  Constitutional:      General: He is not in acute distress.    Appearance: He is not  ill-appearing.  HENT:     Head: Normocephalic and atraumatic.     Right Ear: Tympanic membrane and external ear normal. No mastoid tenderness.     Left Ear: External ear normal. A middle ear effusion is present. No mastoid tenderness. Tympanic membrane is not erythematous or bulging.     Nose: Nose normal.     Mouth/Throat:     Mouth: Mucous membranes are moist.     Pharynx: Oropharynx is clear.  Eyes:     General: No scleral icterus.  Right eye: No discharge.        Left eye: No discharge.     Extraocular Movements: Extraocular movements intact.     Conjunctiva/sclera: Conjunctivae normal.     Pupils: Pupils are equal, round, and reactive to light.  Neck:     Vascular: No JVD.  Cardiovascular:     Rate and Rhythm: Normal rate and regular rhythm.     Pulses: Normal pulses.          Radial pulses are 2+ on the right side and 2+ on the left side.     Heart sounds: Normal heart sounds.  Pulmonary:     Comments: Lungs clear to auscultation in all fields. Symmetric chest rise. No wheezing, rales, or rhonchi. Abdominal:     Comments: Abdomen is soft, non-distended, and non-tender in all quadrants. No rigidity, no guarding. No peritoneal signs.  Musculoskeletal:        General: Normal range of motion.     Cervical back: Normal range of motion.  Skin:    General: Skin is warm and dry.     Capillary Refill: Capillary refill takes less than 2 seconds.  Neurological:     Mental Status: He is oriented to person, place, and time.     GCS: GCS eye subscore is 4. GCS verbal subscore is 5. GCS motor subscore is 6.     Comments: Fluent speech, no facial droop.  Psychiatric:        Behavior: Behavior normal.    ED Results / Procedures / Treatments   Labs (all labs ordered are listed, but only abnormal results are displayed) Labs Reviewed  RESP PANEL BY RT-PCR (FLU A&B, COVID) ARPGX2 - Abnormal; Notable for the following components:      Result Value   Influenza A by PCR POSITIVE (*)     All other components within normal limits  CBC WITH DIFFERENTIAL/PLATELET - Abnormal; Notable for the following components:   Platelets 85 (*)    All other components within normal limits  BASIC METABOLIC PANEL - Abnormal; Notable for the following components:   Glucose, Bld 109 (*)    Creatinine, Ser 1.29 (*)    GFR, Estimated 59 (*)    All other components within normal limits  BRAIN NATRIURETIC PEPTIDE - Abnormal; Notable for the following components:   B Natriuretic Peptide 135.4 (*)    All other components within normal limits  TROPONIN I (HIGH SENSITIVITY)    EKG EKG Interpretation  Date/Time:  Sunday May 17 2021 10:56:59 EDT Ventricular Rate:  68 PR Interval:  216 QRS Duration: 111 QT Interval:  381 QTC Calculation: 406 R Axis:   73 Text Interpretation: Sinus rhythm Borderline prolonged PR interval Nonspecific T abnormalities, lateral leads Confirmed by Eber HongMiller, Brian ([id number]) on 05/17/2021 11:06:33 AM  Radiology DG Chest Portable 1 View  Result Date: 05/17/2021 CLINICAL DATA:  Cough.  COVID positive EXAM: PORTABLE CHEST 1 VIEW COMPARISON:  12/01/2020 FINDINGS: Heart size within normal limits. Prior CABG. Negative for heart failure. Mild right lower lobe atelectasis. Lungs otherwise clear without pneumonia or effusion. IMPRESSION: Mild right lower lobe atelectasis. Electronically Signed   By: Marlan Palauharles  Clark M.D.   On: 05/17/2021 12:31    Procedures Procedures   Medications Ordered in ED Medications  acetaminophen (TYLENOL) tablet 650 mg (650 mg Oral Given 05/17/21 1208)  sodium chloride 0.9 % bolus 500 mL (500 mLs Intravenous New Bag/Given 05/17/21 1427)    ED Course  I have reviewed the triage vital  signs and the nursing notes.  Pertinent labs & imaging results that were available during my care of the patient were reviewed by me and considered in my medical decision making (see chart for details).    MDM Rules/Calculators/A&P                          Found  to be febrile in triage to 100.9, he was initially documented at 100.9 however that was a typo I was informed of this by the RN.  Given Tylenol for fever.  On exam he has signs of left-sided acute otitis media.  No findings to suggest mastoiditis or other severe infection. Lungs are clear to auscultation all fields and he has normal work of breathing.  No hypoxia.  CBC without leukocytosis, no anemia, patient has thrombocytopenia, consistent with his baseline.  BMP shows no significant electrolyte derangement, he does have slight bump in creatinine today it is 1.29 compared to 2 weeks ago when it was 1.06 with his baseline appearing to be around 1.  Patient given 500 ml fluid bolus.  BNP today is 135, no significant elevation.  Troponin is 8.  Low suspicion for ACS or PE.  EKG without ischemic changes. Patient tested positive for influenza A.  Chest xray shows mild right lower lobe atelectasis without signs of focal pneumonia.  I viewed image and agree with radiologist impression. Patient ambulated without hypoxia or tachycardia.  His chest pain has resolved.  He is eager to be discharged home.  Will discharge home with prescription for amoxicillin for acute otitis media as well as Tessalon Perles for cough.  The patient appears reasonably screened and/or stabilized for discharge and I doubt any other medical condition or other [facility] requiring further screening, evaluation, or treatment in the ED at this time prior to discharge. The patient is safe for discharge with strict return precautions discussed. Recommend pcp follow up for recheck. Findings and plan of care discussed with supervising physician Dr. Hyacinth Meeker.  Portions of this note were generated with Scientist, clinical (histocompatibility and immunogenetics). Dictation errors may occur despite best attempts at proofreading.   Final Clinical Impression(s) / ED Diagnoses Final diagnoses:  Cough  Influenza A    Rx / DC Orders ED Discharge Orders          Ordered    amoxicillin  (AMOXIL) 500 MG capsule  2 times daily        05/17/21 1318    benzonatate (TESSALON) 100 MG capsule  Every 8 hours        05/17/21 1454             Shanon Ace, PA-C 05/17/21 1503    Eber Hong, MD 05/19/21 1102

## 2021-05-17 NOTE — Discharge Instructions (Addendum)
-  Prescription sent to pharmacy for amoxicillin.  This is an antibiotic used to treat ear infections.  Start taking as prescribed. -You are also given a prescription for [employer].  This is a cough medicine that should help make you cough less.  You have the flu this is a viral infection that will likely start to improve after 5-7 days, antibiotics are not helpful in treating viral infections.    Please make sure you are drinking plenty of fluids. You can treat your symptoms supportively with tylenol/ibuprofen for fevers and pains, Zyrtec and Flonase to heal with nasal congestion, and over the counter cough syrups and throat lozenges to help with cough. If your symptoms are not improving please follow up with you Primary doctor.   If you develop persistent fevers, shortness of breath or difficulty breathing, chest pain, severe headache and neck pain, persistent nausea and vomiting or other new or concerning symptoms return to the Emergency department.

## 2021-05-17 NOTE — ED Triage Notes (Signed)
Pt presents with a dry cough, sneezing, Left ear pain and feeling "woozy" for "a while" pt also reports BLE edema, takes Lasix PRN.

## 2021-05-17 NOTE — ED Notes (Addendum)
Pt ambulated in the room with a steady gait. O2 Sats remained 97% RA.

## 2021-05-18 ENCOUNTER — Telehealth: Payer: Self-pay | Admitting: Emergency Medicine

## 2021-05-18 NOTE — Telephone Encounter (Signed)
[facility] FYI- 6.19.22:  ---Caller states he had a heart procedure several years ago. Pt having leg swelling and having cough/ fluid in lungs. He was coughing a lot of mucus up last night. Elevating his leg this morning helped some with swelling. Pitting edema. Takes diuretics regularly.  Advised to go to ED now

## 2021-05-21 NOTE — Telephone Encounter (Signed)
Called mobile number unable to leave message, mail box has not been setup.

## 2021-05-22 NOTE — Telephone Encounter (Signed)
Patient returned call, per patient he went to ED for cough diagnosed with Influenza A. He is feeling much better. Also, he is having problems with athletes's foot with an odor, he has tried OTC medications that did not help. He request an appointment with Dr Alvy Bimler on 06/04/2021 at 3:00 pm.

## 2021-05-22 NOTE — Telephone Encounter (Signed)
2nd attempt to contact patient, mobile mail box not setup.

## 2021-05-24 ENCOUNTER — Telehealth: Payer: Self-pay | Admitting: Cardiology

## 2021-05-24 ENCOUNTER — Encounter (HOSPITAL_COMMUNITY): Payer: Self-pay

## 2021-05-24 ENCOUNTER — Emergency Department (HOSPITAL_COMMUNITY): Payer: Medicare Other

## 2021-05-24 ENCOUNTER — Other Ambulatory Visit: Payer: Self-pay

## 2021-05-24 ENCOUNTER — Emergency Department (HOSPITAL_COMMUNITY)
Admission: EM | Admit: 2021-05-24 | Discharge: 2021-05-24 | Disposition: A | Discharge status: Home / Self Care | Payer: Medicare Other | Attending: Emergency Medicine | Admitting: Emergency Medicine

## 2021-05-24 DIAGNOSIS — I2510 Atherosclerotic heart disease of native coronary artery without angina pectoris: Secondary | ICD-10-CM | POA: Diagnosis not present

## 2021-05-24 DIAGNOSIS — Z87891 Personal history of nicotine dependence: Secondary | ICD-10-CM | POA: Diagnosis not present

## 2021-05-24 DIAGNOSIS — I861 Scrotal varices: Secondary | ICD-10-CM | POA: Diagnosis not present

## 2021-05-24 DIAGNOSIS — R1032 Left lower quadrant pain: Secondary | ICD-10-CM

## 2021-05-24 DIAGNOSIS — R001 Bradycardia, unspecified: Secondary | ICD-10-CM | POA: Diagnosis not present

## 2021-05-24 DIAGNOSIS — I10 Essential (primary) hypertension: Secondary | ICD-10-CM | POA: Insufficient documentation

## 2021-05-24 DIAGNOSIS — R1031 Right lower quadrant pain: Secondary | ICD-10-CM | POA: Insufficient documentation

## 2021-05-24 DIAGNOSIS — Z951 Presence of aortocoronary bypass graft: Secondary | ICD-10-CM | POA: Insufficient documentation

## 2021-05-24 DIAGNOSIS — Z79899 Other long term (current) drug therapy: Secondary | ICD-10-CM | POA: Diagnosis not present

## 2021-05-24 DIAGNOSIS — R109 Unspecified abdominal pain: Secondary | ICD-10-CM | POA: Diagnosis not present

## 2021-05-24 LAB — CBC
HCT: 38.9 % — ABNORMAL LOW (ref 39.0–52.0)
Hemoglobin: 12.7 g/dL — ABNORMAL LOW (ref 13.0–17.0)
MCH: 29.5 pg (ref 26.0–34.0)
MCHC: 32.6 g/dL (ref 30.0–36.0)
MCV: 90.5 fL (ref 80.0–100.0)
Platelets: 96 10*3/uL — ABNORMAL LOW (ref 150–400)
RBC: 4.3 MIL/uL (ref 4.22–5.81)
RDW: 12.2 % (ref 11.5–15.5)
WBC: 4 10*3/uL (ref 4.0–10.5)
nRBC: 0 % (ref 0.0–0.2)

## 2021-05-24 LAB — BASIC METABOLIC PANEL
Anion gap: 9 (ref 5–15)
BUN: 19 mg/dL (ref 8–23)
CO2: 24 mmol/L (ref 22–32)
Calcium: 9.4 mg/dL (ref 8.9–10.3)
Chloride: 103 mmol/L (ref 98–111)
Creatinine, Ser: 1.19 mg/dL (ref 0.61–1.24)
GFR, Estimated: >60 mL/min (ref >60)
Glucose, Bld: 109 mg/dL — ABNORMAL HIGH (ref 70–99)
Potassium: 3.9 mmol/L (ref 3.5–5.1)
Sodium: 136 mmol/L (ref 135–145)

## 2021-05-24 LAB — URINALYSIS, ROUTINE W REFLEX MICROSCOPIC
Bilirubin Urine: NEGATIVE
Glucose, UA: NEGATIVE mg/dL
Hgb urine dipstick: NEGATIVE
Ketones, ur: NEGATIVE mg/dL
Leukocytes,Ua: NEGATIVE
Nitrite: NEGATIVE
Protein, ur: NEGATIVE mg/dL
Specific Gravity, Urine: 1.015 (ref 1.005–1.030)
pH: 6 (ref 5.0–8.0)

## 2021-05-24 LAB — HEPATIC FUNCTION PANEL
ALT: 26 U/L (ref 0–44)
AST: 31 U/L (ref 15–41)
Albumin: 3.7 g/dL (ref 3.5–5.0)
Alkaline Phosphatase: 41 U/L (ref 38–126)
Bilirubin, Direct: 0.2 mg/dL (ref 0.0–0.2)
Indirect Bilirubin: 0.4 mg/dL (ref 0.3–0.9)
Total Bilirubin: 0.6 mg/dL (ref 0.3–1.2)
Total Protein: 8.6 g/dL — ABNORMAL HIGH (ref 6.5–8.1)

## 2021-05-24 MED ORDER — CEPHALEXIN 500 MG PO CAPS: 500.0000 mg | ORAL_CAPSULE | Freq: Two times a day (BID) | ORAL | 0 refills | Status: AC

## 2021-05-24 MED ORDER — ACETAMINOPHEN 325 MG PO TABS
650.0000 mg | ORAL_TABLET | Freq: Once | ORAL | Status: AC
  Administered 2021-05-24: 650 mg via ORAL
  Filled 2021-05-24: qty 2

## 2021-05-24 MED ORDER — IOHEXOL 300 MG/ML  SOLN
100.0000 mL | Freq: Once | INTRAMUSCULAR | Status: AC | PRN
  Administered 2021-05-24: 100 mL via INTRAVENOUS

## 2021-05-24 NOTE — ED Notes (Signed)
IV team at bedside 

## 2021-05-24 NOTE — ED Notes (Signed)
US at bedside

## 2021-05-24 NOTE — ED Triage Notes (Signed)
Patient had influenza last week and now reports decreased appetite and concerned with skin peeling in genital area, patient alert and oriented, NAD

## 2021-05-24 NOTE — Telephone Encounter (Signed)
Patient presented to ER today with complaints of scrotal pain today and was noted to have bradycardia with HR 45-60's.  His last HR with Dr. Katrinka Blazing in Feb 2022 was 51 and he was instructed to continue on Toprol XL 25mg  daily.  He is asymptomatic from a cardiac standpoint except for occasional mild dizziness.   I have asked him to call the office on Monday to see what Dr. Wednesday wants him to do with his Toprol dose.  I have asked him to hold his Toprol tomorrow am until he hears back from the office tomorrow.

## 2021-05-24 NOTE — Discharge Instructions (Addendum)
  You were evaluated in the Emergency Department and after careful evaluation, we did not find any emergent condition requiring admission or further testing in the hospital.   Your exam/testing today was overall reassuring.  Symptoms seem to be due to  mild skin infection to groin.  Please take the antibiotics as directed.  I want you to follow-up with your primary care doctor for this, your low platelets, your liver your low heart rate as well.  I also referred you to the heart doctor for this, please follow-up with them.  Your ultrasound does show some varicoceles, I did refer you to the urologist for this as well.  I did put CT findings below.  Please return to the Emergency Department if you experience any worsening of your condition.  Thank you for allowing [place] to be a part of your care. Please speak to your pharmacist about any new medications prescribed today in regards to side effects or interactions with other medications.  Your blood pressure is also elevated today, your primary care doctor about this as well.    IMPRESSION:  1. The liver demonstrates a mildly nodular contour consistent with  cirrhosis. There is a subtle rounded region of increased  attenuation/enhancement in the hepatic dome as above. This could  represent a vascular anomaly or a small enhancing nodule/mass. Given  the findings of cirrhosis, recommend MRI as an outpatient for  further assessment.  2. Cholelithiasis.  3. Left renal cysts.  4. Calcified atherosclerosis in the nonaneurysmal aorta.  5. Fat containing left inguinal hernia, small.  6. No other abnormalities.   IMPRESSION:  1. No acute abnormalities. No evidence of epididymitis/orchitis or  of testicular torsion.  2. No solid testicular masses. 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  3. Small epididymal head cysts.  4. Prominent varicoceles, left larger than right.

## 2021-05-24 NOTE — ED Provider Notes (Signed)
[facility] EMERGENCY DEPARTMENT Provider Note   CSN: [id number] Arrival date & time: 05/24/21  0755     History Chief Complaint  Patient presents with   recheck influenza    [name] is a 75 y.o. male with past medical history of anxiety, CAD, depression, hypertension that presents to the emerge department today for groin pain.  Patient states that he was diagnosed with influenza a week ago, states that he started having groin pain about 3 to 4 days ago with urinary odor.  States that he has a burning sensation in his groin, however not burning when he pees.  States that he is able to urinate normally.  Denies any hematuria.  Patient states that he still getting fevers from his influenza, states that they are infrequent but do come and go.  Denies any myalgias, nausea or vomiting.  Denies any chest pain or shortness of breath.  Does report a cough that has been nonproductive and slowly decreasing.  No other URI symptoms.  Denies any penile pain, penile discharge or penile swelling.  States that groin pain is mainly in his scrotal area.  HPI     Past Medical History:  Diagnosis Date   Anginal pain (HCC) 01/2005   Anxiety    Arthritis    "knees"   Chronic lower back pain    Coronary artery disease    Depression    Hepatitis    "don't know what kind; they treated me for it; was a long time ago" (12/11/2018)   High cholesterol    History of bronchitis    "used to get it alot" (08/25/2012)   Hypertension    Panic attacks    PTSD (post-traumatic stress disorder)    "have been treated in the past" (08/25/2012)    Patient Active Problem List   Diagnosis Date Noted   Chronic diastolic heart failure (HCC) 12/11/2020   Alcohol abuse with alcohol-induced mood disorder (HCC) 06/16/2019   PTSD (post-traumatic stress disorder) 06/16/2019   Thrombocytopenia (HCC) 02/24/2019   Chronic alcohol abuse 12/09/2015   Coronary artery disease involving coronary bypass  graft of native heart without angina pectoris 08/07/2014   Essential hypertension 08/07/2014   Hyperlipidemia 08/07/2014    Past Surgical History:  Procedure Laterality Date   CARDIAC CATHETERIZATION  01/2005   CORONARY ARTERY BYPASS GRAFT  01/2005   CABG X3   Shrapnel Left 1969   LLE; left lateral thumb (required grafting)   VARICOSE VEIN SURGERY  1980's   LLE       Family History  Problem Relation Age of Onset   Diabetes Mother    Heart disease Father    Cancer Brother     Social History   Tobacco Use   Smoking status: Former    Packs/day: 2.00    Years: 5.00    Pack years: 10.00    Types: Cigarettes   Smokeless tobacco: Never   Tobacco comments:    08/25/2012 "quit smoking cigarettes 40 yr ago"  Vaping Use   Vaping Use: Never used  Substance Use Topics   Alcohol use: Yes    Alcohol/week: 60.0 standard drinks    Types: 60 Standard drinks or equivalent per week    Comment: 12/11/2018 "~ 1 pint of vodka/day"   Drug use: Yes    Types: Marijuana    Comment: 08/25/2012 "last marijuana 10 years ago"    Home Medications Prior to Admission medications   Medication Sig Start Date End Date  Taking? Authorizing Provider  cephALEXin (KEFLEX) 500 MG capsule Take 1 capsule (500 mg total) by mouth 2 (two) times daily for 7 days. 05/24/21 05/31/21 Yes Brandalyn Harting, PA-C  amoxicillin (AMOXIL) 500 MG capsule Take 1 capsule (500 mg total) by mouth 2 (two) times daily for 7 days. 05/17/21 05/24/21  Namon Cirri E, PA-C  benzonatate (TESSALON) 100 MG capsule Take 1 capsule (100 mg total) by mouth every 8 (eight) hours. 05/17/21   Walisiewicz, Yvonna Alanis E, PA-C  chlordiazePOXIDE (LIBRIUM) 25 MG capsule Take 1 capsule (25 mg total) by mouth 2 (two) times daily as needed for anxiety. 12/11/20   Georgina Quint, MD  Evolocumab (REPATHA SURECLICK) 140 MG/ML SOAJ Inject 140 mg into the skin every 14 (fourteen) days. 03/23/21   Lyn Records, MD  FLUoxetine (PROZAC) 20 MG capsule Take  20 mg by mouth daily.    [provider]  furosemide (LASIX) 20 MG tablet Take 2 tablets (40 mg total) by mouth daily as needed. 04/23/21   Lyn Records, MD  metoprolol succinate (TOPROL-XL) 25 MG 24 hr tablet Take 1 tablet (25 mg total) by mouth daily. 09/11/20   Rosalio Macadamia, NP  pantoprazole (PROTONIX) 20 MG tablet Take 1 tablet (20 mg total) by mouth 2 (two) times daily before a meal. 04/29/20   Fawze, Mina A, PA-C  rosuvastatin (CRESTOR) 10 MG tablet Take 1 tablet (10 mg total) by mouth daily. 12/11/20   Georgina Quint, MD  losartan (COZAAR) 50 MG tablet Take 50 mg by mouth daily.  04/29/20  [provider]    Allergies    Codeine and Vicodin [hydrocodone-acetaminophen]  Review of Systems   Review of Systems  Constitutional:  Negative for chills, diaphoresis, fatigue and fever.  HENT:  Negative for congestion, sore throat and trouble swallowing.   Eyes:  Negative for pain and visual disturbance.  Respiratory:  Negative for cough, shortness of breath and wheezing.   Cardiovascular:  Negative for chest pain, palpitations and leg swelling.  Gastrointestinal:  Negative for abdominal distention, abdominal pain, diarrhea, nausea and vomiting.  Genitourinary:  Positive for scrotal swelling and testicular pain. Negative for decreased urine volume, difficulty urinating, dysuria, enuresis, flank pain, frequency, hematuria, penile discharge, penile pain, penile swelling and urgency.       Groin pain  Musculoskeletal:  Negative for back pain, neck pain and neck stiffness.  Skin:  Negative for pallor.  Neurological:  Negative for dizziness, speech difficulty, weakness and headaches.  Psychiatric/Behavioral:  Negative for confusion.    Physical Exam Updated Vital Signs BP (!) 178/69   Pulse (!) 45   Temp 98.2 F (36.8 C)   Resp 15   Ht 5\' 11"  (1.803 m)   SpO2 99%   BMI 32.25 kg/m   Physical Exam Constitutional:      General: He is not in acute distress.     Appearance: Normal appearance. He is not ill-appearing, toxic-appearing or diaphoretic.     Comments: Very well-appearing  HENT:     Mouth/Throat:     Mouth: Mucous membranes are moist.     Pharynx: Oropharynx is clear.  Eyes:     General: No scleral icterus.    Extraocular Movements: Extraocular movements intact.     Pupils: Pupils are equal, round, and reactive to light.  Cardiovascular:     Rate and Rhythm: Normal rate and regular rhythm.     Pulses: Normal pulses.     Heart sounds: Normal heart sounds.  Pulmonary:     Effort: Pulmonary effort is normal. No respiratory distress.     Breath sounds: Normal breath sounds. No stridor. No wheezing, rhonchi or rales.  Chest:     Chest wall: No tenderness.  Abdominal:     General: Abdomen is flat. There is no distension.     Palpations: Abdomen is soft.     Tenderness: There is no abdominal tenderness. There is no guarding or rebound.  Genitourinary:    Comments: Chaperone present.  Patient with diffuse scrotal tenderness bilaterally, no scrotal edema or erythema.  No penile pain or penile discharge or penile lesions.  No foul-smelling area.   Musculoskeletal:        General: No swelling or tenderness. Normal range of motion.     Cervical back: Normal range of motion and neck supple. No rigidity.     Right lower leg: No edema.     Left lower leg: No edema.  Skin:    General: Skin is warm and dry.     Capillary Refill: Capillary refill takes less than 2 seconds.     Coloration: Skin is not pale.  Neurological:     General: No focal deficit present.     Mental Status: He is alert and oriented to person, place, and time.  Psychiatric:        Mood and Affect: Mood normal.        Behavior: Behavior normal.    ED Results / Procedures / Treatments   Labs (all labs ordered are listed, but only abnormal results are displayed) Labs Reviewed  BASIC METABOLIC PANEL - Abnormal; Notable for the following components:      Result Value    Glucose, Bld 109 (*)    All other components within normal limits  CBC - Abnormal; Notable for the following components:   Hemoglobin 12.7 (*)    HCT 38.9 (*)    Platelets 96 (*)    All other components within normal limits  HEPATIC FUNCTION PANEL - Abnormal; Notable for the following components:   Total Protein 8.6 (*)    All other components within normal limits  URINALYSIS, ROUTINE W REFLEX MICROSCOPIC    EKG None  Radiology CT Abdomen Pelvis W Contrast  Result Date: 05/24/2021 CLINICAL DATA:  Left-sided abdominal pain. EXAM: CT ABDOMEN AND PELVIS WITH CONTRAST TECHNIQUE: Multidetector CT imaging of the abdomen and pelvis was performed using the standard protocol following bolus administration of intravenous contrast. CONTRAST:  100mL OMNIPAQUE IOHEXOL 300 MG/ML  SOLN COMPARISON:  April 29, 2020 FINDINGS: Lower chest: No acute abnormality. Hepatobiliary: The liver demonstrates a mildly nodular contour consistent with cirrhosis. Low-attenuation in the liver may represent hepatic steatosis. Subtle increased attenuation in the hepatic dome on coronal image 53 and axial image 13. No other liver masses identified. Cholelithiasis is identified without gallbladder wall thickening or adjacent fat stranding. The portal vein is patent. Pancreas: Unremarkable. No pancreatic ductal dilatation or surrounding inflammatory changes. Spleen: Normal in size without focal abnormality. Adrenals/Urinary Tract: Adrenal glands are normal. Left renal cysts are again identified. No suspicious renal masses. The kidneys are otherwise normal. The ureters and bladder are normal. Stomach/Bowel: Stomach is within normal limits. Appendix appears normal. No evidence of bowel wall thickening, distention, or inflammatory changes. Vascular/Lymphatic: Calcified atherosclerosis is seen in the nonaneurysmal aorta. No dissection. No adenopathy. Reproductive: Prostate is unremarkable. Other: A fat containing left inguinal hernia is  identified. Musculoskeletal: No acute or significant osseous findings. IMPRESSION: 1. The liver demonstrates  a mildly nodular contour consistent with cirrhosis. There is a subtle rounded region of increased attenuation/enhancement in the hepatic dome as above. This could represent a vascular anomaly or a small enhancing nodule/mass. Given the findings of cirrhosis, recommend MRI as an outpatient for further assessment. 2. Cholelithiasis. 3. Left renal cysts. 4. Calcified atherosclerosis in the nonaneurysmal aorta. 5. Fat containing left inguinal hernia, small. 6. No other abnormalities. Electronically Signed   By: Gerome Sam III M.D   On: 05/24/2021 14:40   US SCROTUM W/DOPPLER  Result Date: 05/24/2021 CLINICAL DATA:  Bilateral scrotal pain. EXAM: SCROTAL ULTRASOUND DOPPLER ULTRASOUND OF THE TESTICLES TECHNIQUE: Complete ultrasound examination of the testicles, epididymis, and other scrotal structures was performed. Color and spectral Doppler ultrasound were also utilized to evaluate blood flow to the testicles. COMPARISON:  None. FINDINGS: Right testicle Measurements: 3.7 x 1.9 x 2.5 cm. No testicular mass. 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 Left testicle Measurements: 4.3 x 1.8 x 2.6 cm. No mass or microlithiasis visualized. Right epididymis: Heterogeneous. Epididymal head cyst measuring 5 mm. Left epididymis: Heterogeneous. Epididymal head cyst measuring 4 mm. Hydrocele:  Small left hydrocele. Varicocele:  Bilateral varicoceles, left larger than right. Pulsed Doppler interrogation of both testes demonstrates normal low resistance arterial and venous waveforms bilaterally. IMPRESSION: 1. No acute abnormalities. No evidence of epididymitis/orchitis or of testicular torsion. 2. No solid testicular masses. 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 3.  Small epididymal head cysts. 4. Prominent varicoceles, left larger than right. Electronically Signed   By: Amie Portland M.D.   On: 05/24/2021 11:44    Procedures Procedures   Medications Ordered in ED Medications  acetaminophen (TYLENOL) tablet 650 mg (650 mg Oral Given 05/24/21 1156)  iohexol (OMNIPAQUE) 300 MG/ML solution 100 mL (100 mLs Intravenous Contrast Given 05/24/21 1356)    ED Course  I have reviewed the triage vital signs and the nursing notes.  Pertinent labs & imaging results that were available during my care of the patient were reviewed by me and considered in my medical decision making (see chart for details).    MDM Rules/Calculators/A&P                          [name] is a 75 y.o. male with past medical history of anxiety, CAD, depression, hypertension that presents to the emerge department today for groin pain.  Patient is nontoxic-appearing, appears well, crying without any specific findings besides tenderness to palpation.  Will obtain basic labs, ultrasound and CT imaging at this time.  Work-up today without white count, BMP unremarkable, urinalysis unremarkable.  Platelets are low, this appears to be baseline for patient.  CT abdomen pelvis without any acute intra-abdominal pathology, does show incidental findings which I did discuss with patient.  Patient does have questionable cirrhosis, will follow up with PCP, patient has normal LFTs and no right upper quadrant pain.  Ultrasound scrotum does show varicocele, will have patient follow-up with urology.  Did speak to Dr. Rush Landmark who thinks that we should treat for very mild cellulitis at this time.  Patient agreeable with plan, patient has been consistently bradycardic during this visit with heart rate ranging between 45-60.  Did discuss this with patient, he states that he is often bradycardic, however I do want him to follow-up with cardiology.  EKG  seen by Dr. Rush Landmark.  Patient is not having any symptoms of  bradycardia, no weakness, fatigue, dizziness, chest pain, shortness of breath.  Doubt need for further emergent work up at this time. I explained the diagnosis and have given explicit precautions to return to the ER including for any other new or worsening symptoms. The patient understands and accepts the medical plan as it's been dictated and I have answered their questions. Discharge instructions concerning home care and prescriptions have been given. The patient is STABLE and is discharged to home in good condition.  I discussed this case with my attending physician who cosigned this note including patient's presenting symptoms, physical exam, and planned diagnostics and interventions. Attending physician stated agreement with plan or made changes to plan which were implemented.   Attending physician assessed patient at bedside.   Final Clinical Impression(s) / ED Diagnoses Final diagnoses:  Bilateral groin pain    Rx / DC Orders ED Discharge Orders          Ordered    cephALEXin (KEFLEX) 500 MG capsule  2 times daily        05/24/21 1502             Farrel Gordon, PA-C 05/24/21 1511    Tegeler, Canary Brim, MD 05/26/21 1539

## 2021-05-25 NOTE — Telephone Encounter (Signed)
Spoke with pt and made him aware to continue current dose of Metoprolol.  Advised to monitor HR and symptoms and when appropriate to call.  Pt appreciative for call.

## 2021-05-25 NOTE — Telephone Encounter (Signed)
Attempted to contact pt.  VM has not been set up. Unable to leave message.  

## 2021-05-25 NOTE — Telephone Encounter (Signed)
No change in Metoprolol dose recommended.

## 2021-05-26 ENCOUNTER — Other Ambulatory Visit: Payer: Self-pay

## 2021-05-26 ENCOUNTER — Other Ambulatory Visit: Payer: Medicare Other | Admitting: *Deleted

## 2021-05-26 DIAGNOSIS — E7849 Other hyperlipidemia: Secondary | ICD-10-CM

## 2021-05-26 LAB — LIPID PANEL
Chol/HDL Ratio: 3.3 ratio (ref 0.0–5.0)
Cholesterol, Total: 124 mg/dL (ref 100–199)
HDL: 38 mg/dL — ABNORMAL LOW (ref >39)
LDL Chol Calc (NIH): 61 mg/dL (ref 0–99)
Triglycerides: 142 mg/dL (ref 0–149)
VLDL Cholesterol Cal: 25 mg/dL (ref 5–40)

## 2021-05-26 LAB — HEPATIC FUNCTION PANEL
ALT: 23 IU/L (ref 0–44)
AST: 27 IU/L (ref 0–40)
Albumin: 4.5 g/dL (ref 3.7–4.7)
Alkaline Phosphatase: 54 IU/L (ref 44–121)
Bilirubin Total: 0.8 mg/dL (ref 0.0–1.2)
Bilirubin, Direct: 0.26 mg/dL (ref 0.00–0.40)
Total Protein: 8.8 g/dL — ABNORMAL HIGH (ref 6.0–8.5)

## 2021-06-04 ENCOUNTER — Ambulatory Visit: Payer: Medicare Other | Admitting: Emergency Medicine

## 2021-06-11 ENCOUNTER — Ambulatory Visit: Payer: Medicare Other | Admitting: Emergency Medicine

## 2021-06-11 ENCOUNTER — Other Ambulatory Visit: Payer: Self-pay

## 2021-06-16 ENCOUNTER — Encounter: Payer: Self-pay | Admitting: Emergency Medicine

## 2021-06-16 ENCOUNTER — Other Ambulatory Visit: Payer: Self-pay

## 2021-06-16 ENCOUNTER — Ambulatory Visit (INDEPENDENT_AMBULATORY_CARE_PROVIDER_SITE_OTHER): Payer: Medicare Other | Admitting: Emergency Medicine

## 2021-06-16 VITALS — BP 138/68 | HR 54 | Temp 97.8°F | Ht 71.0 in | Wt 228.0 lb

## 2021-06-16 DIAGNOSIS — I5032 Chronic diastolic (congestive) heart failure: Secondary | ICD-10-CM | POA: Diagnosis not present

## 2021-06-16 DIAGNOSIS — E785 Hyperlipidemia, unspecified: Secondary | ICD-10-CM

## 2021-06-16 DIAGNOSIS — K8020 Calculus of gallbladder without cholecystitis without obstruction: Secondary | ICD-10-CM | POA: Insufficient documentation

## 2021-06-16 DIAGNOSIS — K7030 Alcoholic cirrhosis of liver without ascites: Secondary | ICD-10-CM | POA: Diagnosis not present

## 2021-06-16 DIAGNOSIS — I700 Atherosclerosis of aorta: Secondary | ICD-10-CM | POA: Diagnosis not present

## 2021-06-16 DIAGNOSIS — I110 Hypertensive heart disease with heart failure: Secondary | ICD-10-CM | POA: Diagnosis not present

## 2021-06-16 LAB — COMPREHENSIVE METABOLIC PANEL
ALT: 24 U/L (ref 0–53)
AST: 25 U/L (ref 0–37)
Albumin: 4.1 g/dL (ref 3.5–5.2)
Alkaline Phosphatase: 47 U/L (ref 39–117)
BUN: 18 mg/dL (ref 6–23)
CO2: 27 mEq/L (ref 19–32)
Calcium: 9.5 mg/dL (ref 8.4–10.5)
Chloride: 102 mEq/L (ref 96–112)
Creatinine, Ser: 1.09 mg/dL (ref 0.40–1.50)
GFR: 67.17 mL/min (ref >60.00)
Glucose, Bld: 93 mg/dL (ref 70–99)
Potassium: 3.9 mEq/L (ref 3.5–5.1)
Sodium: 136 mEq/L (ref 135–145)
Total Bilirubin: 0.6 mg/dL (ref 0.2–1.2)
Total Protein: 8.5 g/dL — ABNORMAL HIGH (ref 6.0–8.3)

## 2021-06-16 LAB — CBC WITH DIFFERENTIAL/PLATELET
Basophils Absolute: 0 10*3/uL (ref 0.0–0.1)
Basophils Relative: 0.5 % (ref 0.0–3.0)
Eosinophils Absolute: 0.1 10*3/uL (ref 0.0–0.7)
Eosinophils Relative: 1.9 % (ref 0.0–5.0)
HCT: 39.3 % (ref 39.0–52.0)
Hemoglobin: 13.2 g/dL (ref 13.0–17.0)
Lymphocytes Relative: 41.4 % (ref 12.0–46.0)
Lymphs Abs: 2 10*3/uL (ref 0.7–4.0)
MCHC: 33.5 g/dL (ref 30.0–36.0)
MCV: 88.6 fl (ref 78.0–100.0)
Monocytes Absolute: 0.5 10*3/uL (ref 0.1–1.0)
Monocytes Relative: 10.6 % (ref 3.0–12.0)
Neutro Abs: 2.2 10*3/uL (ref 1.4–7.7)
Neutrophils Relative %: 45.6 % (ref 43.0–77.0)
Platelets: 91 10*3/uL — ABNORMAL LOW (ref 150.0–400.0)
RBC: 4.44 Mil/uL (ref 4.22–5.81)
RDW: 13.2 % (ref 11.5–15.5)
WBC: 4.7 10*3/uL (ref 4.0–10.5)

## 2021-06-16 LAB — LIPID PANEL
Cholesterol: 118 mg/dL (ref 0–200)
HDL: 36.7 mg/dL — ABNORMAL LOW (ref >39.00)
LDL Cholesterol: 42 mg/dL (ref 0–99)
NonHDL: 80.94
Total CHOL/HDL Ratio: 3
Triglycerides: 193 mg/dL — ABNORMAL HIGH (ref 0.0–149.0)
VLDL: 38.6 mg/dL (ref 0.0–40.0)

## 2021-06-16 LAB — HEMOGLOBIN A1C: Hgb A1c MFr Bld: 5.7 % (ref 4.6–6.5)

## 2021-06-16 NOTE — Progress Notes (Signed)
[name] 75 y.o.   Chief Complaint  Patient presents with   Follow-up    Chronic conditons   Last office visit assessment and plan as follows: ASSESSMENT & PLAN: Clinically stable.  No medical concerns other than chronic EtOH abuse identified during this visit. Patient states he is going through a detox program. Recent COVID infection.  Asymptomatic without complications. Continue present medications.  No changes. Follow-up in 6 months.  HISTORY OF PRESENT ILLNESS: This is a 75 y.o. male with history of hypertensive heart disease and chronic diastolic heart failure here for follow-up. History of chronic EtOH abuse but quit about 13 months ago.  Doing much better.  Feels much better. Has no complaints or medical concerns today. Was recently seen in the emergency room due to bilateral groin pain.  CT scan of abdomen and pelvis showed liver cirrhosis and gallstones.   HPI   Prior to Admission medications   Medication Sig Start Date End Date Taking? Authorizing Provider  chlordiazePOXIDE (LIBRIUM) 25 MG capsule Take 1 capsule (25 mg total) by mouth 2 (two) times daily as needed for anxiety. 12/11/20  Yes Nirvi Boehler, Eilleen Kempf, MD  Evolocumab (REPATHA SURECLICK) 140 MG/ML SOAJ Inject 140 mg into the skin every 14 (fourteen) days. 03/23/21  Yes Lyn Records, MD  FLUoxetine (PROZAC) 20 MG capsule Take 20 mg by mouth daily.   Yes [provider]  furosemide (LASIX) 20 MG tablet Take 2 tablets (40 mg total) by mouth daily as needed. 04/23/21  Yes Lyn Records, MD  metoprolol succinate (TOPROL-XL) 25 MG 24 hr tablet Take 1 tablet (25 mg total) by mouth daily. 09/11/20  Yes Rosalio Macadamia, NP  pantoprazole (PROTONIX) 20 MG tablet Take 1 tablet (20 mg total) by mouth 2 (two) times daily before a meal. 04/29/20  Yes Fawze, Mina A, PA-C  rosuvastatin (CRESTOR) 10 MG tablet Take 1 tablet (10 mg total) by mouth daily. 12/11/20  Yes Aamina Skiff, Eilleen Kempf, MD  benzonatate (TESSALON)  100 MG capsule Take 1 capsule (100 mg total) by mouth every 8 (eight) hours. Patient not taking: Reported on 06/16/2021 05/17/21   Shanon Ace, PA-C  losartan (COZAAR) 50 MG tablet Take 50 mg by mouth daily.  04/29/20  [provider]    Allergies  Allergen Reactions   Codeine Palpitations   Vicodin [Hydrocodone-Acetaminophen] Nausea And Vomiting, Swelling and Palpitations    Patient Active Problem List   Diagnosis Date Noted   Chronic diastolic heart failure (HCC) 12/11/2020   Alcohol abuse with alcohol-induced mood disorder (HCC) 06/16/2019   PTSD (post-traumatic stress disorder) 06/16/2019   Thrombocytopenia (HCC) 02/24/2019   Chronic alcohol abuse 12/09/2015   Coronary artery disease involving coronary bypass graft of native heart without angina pectoris 08/07/2014   Essential hypertension 08/07/2014   Hyperlipidemia 08/07/2014    Past Medical History:  Diagnosis Date   Anginal pain (HCC) 01/2005   Anxiety    Arthritis    "knees"   Chronic lower back pain    Coronary artery disease    Depression    Hepatitis    "don't know what kind; they treated me for it; was a long time ago" (12/11/2018)   High cholesterol    History of bronchitis    "used to get it alot" (08/25/2012)   Hypertension    Panic attacks    PTSD (post-traumatic stress disorder)    "have been treated in the past" (08/25/2012)    Past Surgical History:  Procedure  Laterality Date   CARDIAC CATHETERIZATION  01/2005   CORONARY ARTERY BYPASS GRAFT  01/2005   CABG X3   Shrapnel Left 1969   LLE; left lateral thumb (required grafting)   VARICOSE VEIN SURGERY  1980's   LLE    Social History   Socioeconomic History   Marital status: Divorced    Spouse name: Not on file   Number of children: Not on file   Years of education: Not on file   Highest education level: Not on file  Occupational History   Not on file  Tobacco Use   Smoking status: Former    Packs/day: 2.00    Years: 5.00     Pack years: 10.00    Types: Cigarettes   Smokeless tobacco: Never   Tobacco comments:    08/25/2012 "quit smoking cigarettes 40 yr ago"  Vaping Use   Vaping Use: Never used  Substance and Sexual Activity   Alcohol use: Yes    Alcohol/week: 60.0 standard drinks    Types: 60 Standard drinks or equivalent per week    Comment: 12/11/2018 "~ 1 pint of vodka/day"   Drug use: Yes    Types: Marijuana    Comment: 08/25/2012 "last marijuana 10 years ago"   Sexual activity: Yes  Other Topics Concern   Not on file  Social History Narrative   Not on file   Social Determinants of Health   Financial Resource Strain: Not on file  Food Insecurity: Not on file  Transportation Needs: Not on file  Physical Activity: Not on file  Stress: Not on file  Social Connections: Not on file  Intimate Partner Violence: Not on file    Family History  Problem Relation Age of Onset   Diabetes Mother    Heart disease Father    Cancer Brother      Review of Systems  Constitutional: Negative.  Negative for chills and fever.  HENT: Negative.  Negative for congestion and sore throat.   Respiratory: Negative.  Negative for cough and shortness of breath.   Cardiovascular: Negative.  Negative for chest pain and palpitations.  Gastrointestinal:  Negative for abdominal pain, diarrhea, nausea and vomiting.  Genitourinary: Negative.  Negative for dysuria and hematuria.  Skin: Negative.  Negative for rash.  Neurological:  Negative for dizziness and headaches.  All other systems reviewed and are negative.  Today's Vitals   06/16/21 1048  BP: 138/68  Pulse: (!) 54  Temp: 97.8 F (36.6 C)  TempSrc: Oral  SpO2: 98%  Weight: 228 lb (103.4 kg)  Height: 5\' 11"  (1.803 m)   Body mass index is 31.8 kg/m. Wt Readings from Last 3 Encounters:  06/16/21 228 lb (103.4 kg)  01/06/21 231 lb 3.2 oz (104.9 kg)  12/11/20 223 lb (101.2 kg)    Physical Exam Vitals reviewed.  Constitutional:      Appearance: Normal  appearance.  HENT:     Head: Normocephalic.  Eyes:     Extraocular Movements: Extraocular movements intact.     Conjunctiva/sclera: Conjunctivae normal.     Pupils: Pupils are equal, round, and reactive to light.  Cardiovascular:     Rate and Rhythm: Normal rate and regular rhythm.     Pulses: Normal pulses.     Heart sounds: Normal heart sounds.  Pulmonary:     Effort: Pulmonary effort is normal.     Breath sounds: Normal breath sounds.  Abdominal:     General: There is no distension.     Palpations:  Abdomen is soft.     Tenderness: There is no abdominal tenderness.  Musculoskeletal:        General: Normal range of motion.     Cervical back: Normal range of motion and neck supple.  Skin:    General: Skin is warm and dry.     Capillary Refill: Capillary refill takes less than 2 seconds.  Neurological:     General: No focal deficit present.     Mental Status: He is alert and oriented to person, place, and time.  Psychiatric:        Mood and Affect: Mood normal.        Behavior: Behavior normal.     ASSESSMENT & PLAN: A total of 30 minutes was spent with the patient and counseling/coordination of care regarding preparing for this visit, review of most recent office visit notes, review of most recent emergency department visit notes, review of most recent blood work results, review of most recent CT scan of abdomen and pelvis which shows liver cirrhosis and cholelithiasis, review of all medications, health maintenance items, education on nutrition, importance of alcohol abstinence, prognosis, documentation and need for follow-up.   Hypertensive heart disease with chronic diastolic congestive heart failure (HCC) Well-controlled hypertension.  Clinically euvolemic. Continue Lasix 40 mg daily, losartan 50 mg daily, and metoprolol succinate 25 mg daily. Dietary approach to stop hypertension discussed.  Alcoholic cirrhosis of liver without ascites (HCC) Newly diagnosed.  Referral  to GI placed today for further evaluation. Patient no longer drinking.  Dyslipidemia Intolerant to statins.  Normal most recent lipid profile. Continue Repatha every 14 days.  Osha was seen today for follow-up.  Diagnoses and all orders for this visit:  Hypertensive heart disease with chronic diastolic congestive heart failure (HCC) -     CBC with Differential/Platelet -     Comprehensive metabolic panel -     Hemoglobin A1c  Atherosclerosis of aorta (HCC)  Dyslipidemia -     Lipid panel  Alcoholic cirrhosis of liver without ascites (HCC) -     Ambulatory referral to Gastroenterology  Patient Instructions  Cirrhosis  Cirrhosis is long-term (chronic) liver injury. The liver is the body's largest internal organ, and it performs many functions. It converts food into energy, removes toxic material from theblood, makes important proteins, and absorbs necessary vitamins from food. In cirrhosis, healthy liver cells are replaced by scar tissue. This prevents blood from flowing through the liver and makes it difficult for the liver tocomplete its functions. What are the causes? Common causes of this condition are hepatitis C and long-term alcohol abuse. Other causes include: Nonalcoholic fatty liver disease (NAFLD). This happens when fat is deposited in the liver by causes other than alcohol. Hepatitis B infection. Autoimmune hepatitis. In this condition, the body's defense system (immune system) mistakenly attacks the liver cells, causing inflammation. Diseases that cause blockage of ducts inside the liver. Inherited liver diseases, such as hemochromatosis. This is one of the most common inherited liver diseases. In this disease, deposits of iron collect in the liver and other organs. Reactions to certain long-term medicines, such as amiodarone, a heart medicine. Parasitic infections. These include schistosomiasis, which is caused by a flatworm. Long-term contact to certain toxins.  These toxins include certain organic solvents, such as toluene and chloroform. What increases the risk? You are more likely to develop this condition if: You have certain types of viral hepatitis. You abuse alcohol, especially if you are male. You are overweight. You use IV drugs  and share needles. You have unprotected sex with someone who has viral hepatitis. What are the signs or symptoms? You may not have any signs and symptoms at first. Symptoms may not developuntil the damage to your liver starts to get worse. Early symptoms may include: Weakness and tiredness (fatigue). Changes in sleep patterns or having trouble sleeping. Itchiness. Tenderness in the right-upper part of your abdomen. Weight loss and muscle loss. Nausea. Loss of appetite. Later symptoms may include: Fatigue or weakness that is getting worse. Yellow skin and eyes (jaundice). Buildup of fluid in the abdomen (ascites). You may notice that your clothes are tight around your waist. Weight gain and swelling of the feet and ankles (edema). Trouble breathing. Easy bruising and bleeding. Vomiting blood, or black or bloody stool. Mental confusion. How is this diagnosed? Your health care provider may suspect cirrhosis based on your symptoms and medical history, especially if you have other medical conditions or a history of alcohol abuse. Your health care provider will do a physical exam to feel your liver and to check for signs of cirrhosis. Tests may include: Blood tests to check: For hepatitis B or C. Kidney function. Liver function. Imaging tests such as: MRI or CT scan to look for changes seen in advanced cirrhosis. Ultrasound to see if normal liver tissue is being replaced by scar tissue. A procedure in which a long needle is used to take a sample of liver tissue to be checked in a lab (biopsy). Liver biopsy can confirm the diagnosis of cirrhosis. How is this treated? Treatment for this condition depends on  how damaged your liver is and what caused the damage. It may include treating the symptoms of cirrhosis, or treating the underlying causes to slow the damage. Treatment may include: Making lifestyle changes, such as: Eating a healthy diet. You may need to work with your health care provider or a dietitian to develop an eating plan. Restricting salt intake. Maintaining a healthy weight. Not abusing drugs or alcohol. Taking medicines to: Treat liver infections or other infections. Control itching. Reduce fluid buildup. Reduce certain blood toxins. Reduce risk of bleeding from enlarged blood vessels in the stomach or esophagus (varices). Liver transplant. In this procedure, a liver from a donor is used to replace your diseased liver. This is done if cirrhosis has caused liver failure. Other treatments and procedures may be done depending on the problems that you get from cirrhosis. Common problems include liver-related kidney failure (hepatorenal syndrome). Follow these instructions at home:  Take medicines only as told by your health care provider. Do not use medicines that are toxic to your liver. Ask your health care provider before taking any new medicines, including over-the-counter medicines such as NSAIDs. Rest as needed. Eat a well-balanced diet. Limit your salt or water intake, if your health care provider asks you to do this. Do not drink alcohol. This is especially important if you routinely take acetaminophen. Keep all follow-up visits. This is important. Contact a health care provider if you: Have fatigue or weakness that is getting worse. Develop swelling of the hands, feet, or legs, or a buildup of fluid in the abdomen (ascites). Have a fever or chills. Develop loss of appetite. Have nausea or vomiting. Develop jaundice. Develop easy bruising or bleeding. Get help right away if you: Vomit bright red blood or a material that looks like coffee grounds. Have blood in your  stools. Notice that your stools appear black and tarry. Become confused. Have chest pain or trouble  breathing. These symptoms may represent a serious problem that is an emergency. Do not wait to see if the symptoms will go away. Get medical help right away. Call your local emergency services (911 in the [place] Do not drive yourself to the hospital. Summary Cirrhosis is chronic liver injury. Common causes are hepatitis C and long-term alcohol abuse. Tests used to diagnose cirrhosis include blood tests, imaging tests, and liver biopsy. Treatment for this condition involves treating the underlying cause. Avoid alcohol, drugs, salt, and medicines that may damage your liver. Get help right away if you vomit bright red blood or a material that looks like coffee grounds. This information is not intended to replace advice given to you by your health care provider. Make sure you discuss any questions you have with your healthcare provider. Document Revised: 08/28/2020 Document Reviewed: 08/28/2020 Elsevier Patient Education  2022 [employer] Hypertension, Adult High blood pressure (hypertension) is when the force of blood pumping through the arteries is too strong. The arteries are the blood vessels that carry blood from the heart throughout the body. Hypertension forces the heart to work harder to pump blood and may cause arteries to become narrow or stiff. Untreated or uncontrolled hypertension can cause a heart attack, heart failure, a stroke, kidney disease, and otherproblems. A blood pressure reading consists of a higher number over a lower number. Ideally, your blood pressure should be below 120/80. The first ("top") number is called the systolic pressure. It is a measure of the pressure in your arteries as your heart beats. The second ("bottom") number is called the diastolic pressure. It is a measure of the pressure in your arteries as theheart relaxes. What are the causes? The exact cause of this  condition is not known. There are some conditions thatresult in or are related to high blood pressure. What increases the risk? Some risk factors for high blood pressure are under your control. The following factors may make you more likely to develop this condition: Smoking. Having type 2 diabetes mellitus, high cholesterol, or both. Not getting enough exercise or physical activity. Being overweight. Having too much fat, sugar, calories, or salt (sodium) in your diet. Drinking too much alcohol. Some risk factors for high blood pressure may be difficult or impossible to change. Some of these factors include: Having chronic kidney disease. Having a family history of high blood pressure. Age. Risk increases with age. Race. You may be at higher risk if you are African American. Gender. Men are at higher risk than women before age 60. After age 51, women are at higher risk than men. Having obstructive sleep apnea. Stress. What are the signs or symptoms? High blood pressure may not cause symptoms. Very high blood pressure (hypertensive crisis) may cause: Headache. Anxiety. Shortness of breath. Nosebleed. Nausea and vomiting. Vision changes. Severe chest pain. Seizures. How is this diagnosed? This condition is diagnosed by measuring your blood pressure while you are seated, with your arm resting on a flat surface, your legs uncrossed, and your feet flat on the floor. The cuff of the blood pressure monitor will be placed directly against the skin of your upper arm at the level of your heart. It should be measured at least twice using the same arm. Certain conditions cancause a difference in blood pressure between your right and left arms. Certain factors can cause blood pressure readings to be lower or higher than normal for a short period of time: When your blood pressure is higher when you are in  a health care provider's office than when you are at home, this is called white coat  hypertension. Most people with this condition do not need medicines. When your blood pressure is higher at home than when you are in a health care provider's office, this is called masked hypertension. Most people with this condition may need medicines to control blood pressure. If you have a high blood pressure reading during one visit or you have normal blood pressure with other risk factors, you may be asked to: Return on a different day to have your blood pressure checked again. Monitor your blood pressure at home for 1 week or longer. If you are diagnosed with hypertension, you may have other blood or imaging tests to help your health care provider understand your overall risk for otherconditions. How is this treated? This condition is treated by making healthy lifestyle changes, such as eating healthy foods, exercising more, and reducing your alcohol intake. Your health care provider may prescribe medicine if lifestyle changes are not enough to get your blood pressure under control, and if: Your systolic blood pressure is above 130. Your diastolic blood pressure is above 80. Your personal target blood pressure may vary depending on your medicalconditions, your age, and other factors. Follow these instructions at home: Eating and drinking  Eat a diet that is high in fiber and potassium, and low in sodium, added sugar, and fat. An example eating plan is called the DASH (Dietary Approaches to Stop Hypertension) diet. To eat this way: Eat plenty of fresh fruits and vegetables. Try to fill one half of your plate at each meal with fruits and vegetables. Eat whole grains, such as whole-wheat pasta, brown rice, or whole-grain bread. Fill about one fourth of your plate with whole grains. Eat or drink low-fat dairy products, such as skim milk or low-fat yogurt. Avoid fatty cuts of meat, processed or cured meats, and poultry with skin. Fill about one fourth of your plate with lean proteins, such as  fish, chicken without skin, beans, eggs, or tofu. Avoid pre-made and processed foods. These tend to be higher in sodium, added sugar, and fat. Reduce your daily sodium intake. Most people with hypertension should eat less than 1,500 mg of sodium a day. Do not drink alcohol if: Your health care provider tells you not to drink. You are pregnant, may be pregnant, or are planning to become pregnant. If you drink alcohol: Limit how much you use to: 0-1 drink a day for women. 0-2 drinks a day for men. Be aware of how much alcohol is in your drink. In the [place] one drink equals one 12 oz bottle of beer (355 mL), one 5 oz glass of wine (148 mL), or one 1 oz glass of hard liquor (44 mL).  Lifestyle  Work with your health care provider to maintain a healthy body weight or to lose weight. Ask what an ideal weight is for you. Get at least 30 minutes of exercise most days of the week. Activities may include walking, swimming, or biking. Include exercise to strengthen your muscles (resistance exercise), such as Pilates or lifting weights, as part of your weekly exercise routine. Try to do these types of exercises for 30 minutes at least 3 days a week. Do not use any products that contain nicotine or tobacco, such as cigarettes, e-cigarettes, and chewing tobacco. If you need help quitting, ask your health care provider. Monitor your blood pressure at home as told by your health care provider. Keep all  follow-up visits as told by your health care provider. This is important.  Medicines Take over-the-counter and prescription medicines only as told by your health care provider. Follow directions carefully. Blood pressure medicines must be taken as prescribed. Do not skip doses of blood pressure medicine. Doing this puts you at risk for problems and can make the medicine less effective. Ask your health care provider about side effects or reactions to medicines that you should watch for. Contact a health care  provider if you: Think you are having a reaction to a medicine you are taking. Have headaches that keep coming back (recurring). Feel dizzy. Have swelling in your ankles. Have trouble with your vision. Get help right away if you: Develop a severe headache or confusion. Have unusual weakness or numbness. Feel faint. Have severe pain in your chest or abdomen. Vomit repeatedly. Have trouble breathing. Summary Hypertension is when the force of blood pumping through your arteries is too strong. If this condition is not controlled, it may put you at risk for serious complications. Your personal target blood pressure may vary depending on your medical conditions, your age, and other factors. For most people, a normal blood pressure is less than 120/80. Hypertension is treated with lifestyle changes, medicines, or a combination of both. Lifestyle changes include losing weight, eating a healthy, low-sodium diet, exercising more, and limiting alcohol. This information is not intended to replace advice given to you by your health care provider. Make sure you discuss any questions you have with your healthcare provider. Document Revised: 07/26/2018 Document Reviewed: 07/26/2018 Elsevier Patient Education  2022 Else[employer] Inc.  Edwina Barth, MD [facility]

## 2021-06-16 NOTE — Assessment & Plan Note (Signed)
Newly diagnosed.  Referral to GI placed today for further evaluation. Patient no longer drinking.

## 2021-06-16 NOTE — Patient Instructions (Addendum)
Cirrhosis  Cirrhosis is long-term (chronic) liver injury. The liver is the body's largest internal organ, and it performs many functions. It converts food into energy, removes toxic material from theblood, makes important proteins, and absorbs necessary vitamins from food. In cirrhosis, healthy liver cells are replaced by scar tissue. This prevents blood from flowing through the liver and makes it difficult for the liver tocomplete its functions. What are the causes? Common causes of this condition are hepatitis C and long-term alcohol abuse. Other causes include: Nonalcoholic fatty liver disease (NAFLD). This happens when fat is deposited in the liver by causes other than alcohol. Hepatitis B infection. Autoimmune hepatitis. In this condition, the body's defense system (immune system) mistakenly attacks the liver cells, causing inflammation. Diseases that cause blockage of ducts inside the liver. Inherited liver diseases, such as hemochromatosis. This is one of the most common inherited liver diseases. In this disease, deposits of iron collect in the liver and other organs. Reactions to certain long-term medicines, such as amiodarone, a heart medicine. Parasitic infections. These include schistosomiasis, which is caused by a flatworm. Long-term contact to certain toxins. These toxins include certain organic solvents, such as toluene and chloroform. What increases the risk? You are more likely to develop this condition if: You have certain types of viral hepatitis. You abuse alcohol, especially if you are male. You are overweight. You use IV drugs and share needles. You have unprotected sex with someone who has viral hepatitis. What are the signs or symptoms? You may not have any signs and symptoms at first. Symptoms may not developuntil the damage to your liver starts to get worse. Early symptoms may include: Weakness and tiredness (fatigue). Changes in sleep patterns or having trouble  sleeping. Itchiness. Tenderness in the right-upper part of your abdomen. Weight loss and muscle loss. Nausea. Loss of appetite. Later symptoms may include: Fatigue or weakness that is getting worse. Yellow skin and eyes (jaundice). Buildup of fluid in the abdomen (ascites). You may notice that your clothes are tight around your waist. Weight gain and swelling of the feet and ankles (edema). Trouble breathing. Easy bruising and bleeding. Vomiting blood, or black or bloody stool. Mental confusion. How is this diagnosed? Your health care provider may suspect cirrhosis based on your symptoms and medical history, especially if you have other medical conditions or a history of alcohol abuse. Your health care provider will do a physical exam to feel your liver and to check for signs of cirrhosis. Tests may include: Blood tests to check: For hepatitis B or C. Kidney function. Liver function. Imaging tests such as: MRI or CT scan to look for changes seen in advanced cirrhosis. Ultrasound to see if normal liver tissue is being replaced by scar tissue. A procedure in which a long needle is used to take a sample of liver tissue to be checked in a lab (biopsy). Liver biopsy can confirm the diagnosis of cirrhosis. How is this treated? Treatment for this condition depends on how damaged your liver is and what caused the damage. It may include treating the symptoms of cirrhosis, or treating the underlying causes to slow the damage. Treatment may include: Making lifestyle changes, such as: Eating a healthy diet. You may need to work with your health care provider or a dietitian to develop an eating plan. Restricting salt intake. Maintaining a healthy weight. Not abusing drugs or alcohol. Taking medicines to: Treat liver infections or other infections. Control itching. Reduce fluid buildup. Reduce certain blood toxins. Reduce risk  of bleeding from enlarged blood vessels in the stomach or esophagus  (varices). Liver transplant. In this procedure, a liver from a donor is used to replace your diseased liver. This is done if cirrhosis has caused liver failure. Other treatments and procedures may be done depending on the problems that you get from cirrhosis. Common problems include liver-related kidney failure (hepatorenal syndrome). Follow these instructions at home:  Take medicines only as told by your health care provider. Do not use medicines that are toxic to your liver. Ask your health care provider before taking any new medicines, including over-the-counter medicines such as NSAIDs. Rest as needed. Eat a well-balanced diet. Limit your salt or water intake, if your health care provider asks you to do this. Do not drink alcohol. This is especially important if you routinely take acetaminophen. Keep all follow-up visits. This is important. Contact a health care provider if you: Have fatigue or weakness that is getting worse. Develop swelling of the hands, feet, or legs, or a buildup of fluid in the abdomen (ascites). Have a fever or chills. Develop loss of appetite. Have nausea or vomiting. Develop jaundice. Develop easy bruising or bleeding. Get help right away if you: Vomit bright red blood or a material that looks like coffee grounds. Have blood in your stools. Notice that your stools appear black and tarry. Become confused. Have chest pain or trouble breathing. These symptoms may represent a serious problem that is an emergency. Do not wait to see if the symptoms will go away. Get medical help right away. Call your local emergency services (911 in the [place] Do not drive yourself to the hospital. Summary Cirrhosis is chronic liver injury. Common causes are hepatitis C and long-term alcohol abuse. Tests used to diagnose cirrhosis include blood tests, imaging tests, and liver biopsy. Treatment for this condition involves treating the underlying cause. Avoid alcohol, drugs, salt,  and medicines that may damage your liver. Get help right away if you vomit bright red blood or a material that looks like coffee grounds. This information is not intended to replace advice given to you by your health care provider. Make sure you discuss any questions you have with your healthcare provider. Document Revised: 08/28/2020 Document Reviewed: 08/28/2020 Elsevier Patient Education  2022 [employer] Hypertension, Adult High blood pressure (hypertension) is when the force of blood pumping through the arteries is too strong. The arteries are the blood vessels that carry blood from the heart throughout the body. Hypertension forces the heart to work harder to pump blood and may cause arteries to become narrow or stiff. Untreated or uncontrolled hypertension can cause a heart attack, heart failure, a stroke, kidney disease, and otherproblems. A blood pressure reading consists of a higher number over a lower number. Ideally, your blood pressure should be below 120/80. The first ("top") number is called the systolic pressure. It is a measure of the pressure in your arteries as your heart beats. The second ("bottom") number is called the diastolic pressure. It is a measure of the pressure in your arteries as theheart relaxes. What are the causes? The exact cause of this condition is not known. There are some conditions thatresult in or are related to high blood pressure. What increases the risk? Some risk factors for high blood pressure are under your control. The following factors may make you more likely to develop this condition: Smoking. Having type 2 diabetes mellitus, high cholesterol, or both. Not getting enough exercise or physical activity. Being overweight. Having too  much fat, sugar, calories, or salt (sodium) in your diet. Drinking too much alcohol. Some risk factors for high blood pressure may be difficult or impossible to change. Some of these factors include: Having chronic  kidney disease. Having a family history of high blood pressure. Age. Risk increases with age. Race. You may be at higher risk if you are African American. Gender. Men are at higher risk than women before age 63. After age 28, women are at higher risk than men. Having obstructive sleep apnea. Stress. What are the signs or symptoms? High blood pressure may not cause symptoms. Very high blood pressure (hypertensive crisis) may cause: Headache. Anxiety. Shortness of breath. Nosebleed. Nausea and vomiting. Vision changes. Severe chest pain. Seizures. How is this diagnosed? This condition is diagnosed by measuring your blood pressure while you are seated, with your arm resting on a flat surface, your legs uncrossed, and your feet flat on the floor. The cuff of the blood pressure monitor will be placed directly against the skin of your upper arm at the level of your heart. It should be measured at least twice using the same arm. Certain conditions cancause a difference in blood pressure between your right and left arms. Certain factors can cause blood pressure readings to be lower or higher than normal for a short period of time: When your blood pressure is higher when you are in a health care provider's office than when you are at home, this is called white coat hypertension. Most people with this condition do not need medicines. When your blood pressure is higher at home than when you are in a health care provider's office, this is called masked hypertension. Most people with this condition may need medicines to control blood pressure. If you have a high blood pressure reading during one visit or you have normal blood pressure with other risk factors, you may be asked to: Return on a different day to have your blood pressure checked again. Monitor your blood pressure at home for 1 week or longer. If you are diagnosed with hypertension, you may have other blood or imaging tests to help your health  care provider understand your overall risk for otherconditions. How is this treated? This condition is treated by making healthy lifestyle changes, such as eating healthy foods, exercising more, and reducing your alcohol intake. Your health care provider may prescribe medicine if lifestyle changes are not enough to get your blood pressure under control, and if: Your systolic blood pressure is above 130. Your diastolic blood pressure is above 80. Your personal target blood pressure may vary depending on your medicalconditions, your age, and other factors. Follow these instructions at home: Eating and drinking  Eat a diet that is high in fiber and potassium, and low in sodium, added sugar, and fat. An example eating plan is called the DASH (Dietary Approaches to Stop Hypertension) diet. To eat this way: Eat plenty of fresh fruits and vegetables. Try to fill one half of your plate at each meal with fruits and vegetables. Eat whole grains, such as whole-wheat pasta, brown rice, or whole-grain bread. Fill about one fourth of your plate with whole grains. Eat or drink low-fat dairy products, such as skim milk or low-fat yogurt. Avoid fatty cuts of meat, processed or cured meats, and poultry with skin. Fill about one fourth of your plate with lean proteins, such as fish, chicken without skin, beans, eggs, or tofu. Avoid pre-made and processed foods. These tend to be higher in sodium,  added sugar, and fat. Reduce your daily sodium intake. Most people with hypertension should eat less than 1,500 mg of sodium a day. Do not drink alcohol if: Your health care provider tells you not to drink. You are pregnant, may be pregnant, or are planning to become pregnant. If you drink alcohol: Limit how much you use to: 0-1 drink a day for women. 0-2 drinks a day for men. Be aware of how much alcohol is in your drink. In the [place] one drink equals one 12 oz bottle of beer (355 mL), one 5 oz glass of wine (148 mL),  or one 1 oz glass of hard liquor (44 mL).  Lifestyle  Work with your health care provider to maintain a healthy body weight or to lose weight. Ask what an ideal weight is for you. Get at least 30 minutes of exercise most days of the week. Activities may include walking, swimming, or biking. Include exercise to strengthen your muscles (resistance exercise), such as Pilates or lifting weights, as part of your weekly exercise routine. Try to do these types of exercises for 30 minutes at least 3 days a week. Do not use any products that contain nicotine or tobacco, such as cigarettes, e-cigarettes, and chewing tobacco. If you need help quitting, ask your health care provider. Monitor your blood pressure at home as told by your health care provider. Keep all follow-up visits as told by your health care provider. This is important.  Medicines Take over-the-counter and prescription medicines only as told by your health care provider. Follow directions carefully. Blood pressure medicines must be taken as prescribed. Do not skip doses of blood pressure medicine. Doing this puts you at risk for problems and can make the medicine less effective. Ask your health care provider about side effects or reactions to medicines that you should watch for. Contact a health care provider if you: Think you are having a reaction to a medicine you are taking. Have headaches that keep coming back (recurring). Feel dizzy. Have swelling in your ankles. Have trouble with your vision. Get help right away if you: Develop a severe headache or confusion. Have unusual weakness or numbness. Feel faint. Have severe pain in your chest or abdomen. Vomit repeatedly. Have trouble breathing. Summary Hypertension is when the force of blood pumping through your arteries is too strong. If this condition is not controlled, it may put you at risk for serious complications. Your personal target blood pressure may vary depending on  your medical conditions, your age, and other factors. For most people, a normal blood pressure is less than 120/80. Hypertension is treated with lifestyle changes, medicines, or a combination of both. Lifestyle changes include losing weight, eating a healthy, low-sodium diet, exercising more, and limiting alcohol. This information is not intended to replace advice given to you by your health care provider. Make sure you discuss any questions you have with your healthcare provider. Document Revised: 07/26/2018 Document Reviewed: 07/26/2018 Elsevier Patient Education  2022 [employer].

## 2021-06-16 NOTE — Assessment & Plan Note (Signed)
Well-controlled hypertension.  Clinically euvolemic. Continue Lasix 40 mg daily, losartan 50 mg daily, and metoprolol succinate 25 mg daily. Dietary approach to stop hypertension discussed.

## 2021-06-16 NOTE — Assessment & Plan Note (Signed)
Intolerant to statins.  Normal most recent lipid profile. Continue Repatha every 14 days.

## 2021-06-23 ENCOUNTER — Telehealth: Payer: Self-pay | Admitting: Interventional Cardiology

## 2021-06-23 NOTE — Telephone Encounter (Signed)
Will route to Pharmacy team to review meds

## 2021-06-23 NOTE — Telephone Encounter (Signed)
Pt just found out he has cancer and he wants to confirm that the medication he is on will not damage his liver further

## 2021-06-23 NOTE — Telephone Encounter (Signed)
Spoke with pt and he states cirrhosis is correct, not cancer.  Pt wanted to let us know that he is no longer on the Rosuvastatin because PCP stopped it due to the cirrhosis stating they were concerned it would affect his liver.  Pt appreciative for call.

## 2021-06-23 NOTE — Telephone Encounter (Signed)
Do not see anything in his chart involving a cancer diagnosis.  I see a liver cirrhosis diagnosis, but not cancer.  Recommend he reach out to oncology or who diagnosed him or request more information

## 2021-06-24 ENCOUNTER — Other Ambulatory Visit: Payer: Self-pay | Admitting: *Deleted

## 2021-06-24 NOTE — Telephone Encounter (Signed)
Spoke with pt and made him aware of information from the Pharmacist.  Pt states he is not actually taking Fluoxetine right now.  He's aware that liver function will need to be monitored regularly and Furosemide adjusted if necessary.

## 2021-06-24 NOTE — Telephone Encounter (Signed)
Furosemide: Hepatic: Patients with preexisting hepatic cirrhosis and ascites may experience precipitated hepatic coma with sudden fluid or electrolyte alteration; monitoring recommended  Fluoxetine: Hepatic: Use caution in patients with hepatic impairment, including cirrhosis; dosage adjustment may be necessar

## 2021-07-25 ENCOUNTER — Other Ambulatory Visit: Payer: Self-pay | Admitting: Interventional Cardiology

## 2021-08-09 ENCOUNTER — Other Ambulatory Visit: Payer: Self-pay

## 2021-08-09 ENCOUNTER — Emergency Department (HOSPITAL_COMMUNITY)
Admission: EM | Admit: 2021-08-09 | Discharge: 2021-08-09 | Disposition: A | Discharge status: Home / Self Care | Payer: Medicare Other | Attending: Emergency Medicine | Admitting: Emergency Medicine

## 2021-08-09 ENCOUNTER — Emergency Department (HOSPITAL_COMMUNITY): Payer: Medicare Other

## 2021-08-09 ENCOUNTER — Encounter (HOSPITAL_COMMUNITY): Payer: Self-pay | Admitting: Emergency Medicine

## 2021-08-09 DIAGNOSIS — R61 Generalized hyperhidrosis: Secondary | ICD-10-CM | POA: Diagnosis not present

## 2021-08-09 DIAGNOSIS — R6889 Other general symptoms and signs: Secondary | ICD-10-CM | POA: Diagnosis not present

## 2021-08-09 DIAGNOSIS — I2510 Atherosclerotic heart disease of native coronary artery without angina pectoris: Secondary | ICD-10-CM | POA: Insufficient documentation

## 2021-08-09 DIAGNOSIS — Z951 Presence of aortocoronary bypass graft: Secondary | ICD-10-CM | POA: Insufficient documentation

## 2021-08-09 DIAGNOSIS — R079 Chest pain, unspecified: Secondary | ICD-10-CM | POA: Insufficient documentation

## 2021-08-09 DIAGNOSIS — Z743 Need for continuous supervision: Secondary | ICD-10-CM | POA: Diagnosis not present

## 2021-08-09 DIAGNOSIS — R110 Nausea: Secondary | ICD-10-CM | POA: Diagnosis not present

## 2021-08-09 DIAGNOSIS — R0602 Shortness of breath: Secondary | ICD-10-CM | POA: Diagnosis not present

## 2021-08-09 DIAGNOSIS — R002 Palpitations: Secondary | ICD-10-CM | POA: Insufficient documentation

## 2021-08-09 DIAGNOSIS — R0789 Other chest pain: Secondary | ICD-10-CM | POA: Diagnosis not present

## 2021-08-09 DIAGNOSIS — I5030 Unspecified diastolic (congestive) heart failure: Secondary | ICD-10-CM | POA: Insufficient documentation

## 2021-08-09 DIAGNOSIS — Z5321 Procedure and treatment not carried out due to patient leaving prior to being seen by health care provider: Secondary | ICD-10-CM | POA: Diagnosis not present

## 2021-08-09 LAB — CBC WITH DIFFERENTIAL/PLATELET
Abs Immature Granulocytes: 0.01 10*3/uL (ref 0.00–0.07)
Basophils Absolute: 0 10*3/uL (ref 0.0–0.1)
Basophils Relative: 1 %
Eosinophils Absolute: 0.1 10*3/uL (ref 0.0–0.5)
Eosinophils Relative: 2 %
HCT: 42.6 % (ref 39.0–52.0)
Hemoglobin: 14 g/dL (ref 13.0–17.0)
Immature Granulocytes: 0 %
Lymphocytes Relative: 45 %
Lymphs Abs: 2.7 10*3/uL (ref 0.7–4.0)
MCH: 29.7 pg (ref 26.0–34.0)
MCHC: 32.9 g/dL (ref 30.0–36.0)
MCV: 90.4 fL (ref 80.0–100.0)
Monocytes Absolute: 0.5 10*3/uL (ref 0.1–1.0)
Monocytes Relative: 9 %
Neutro Abs: 2.4 10*3/uL (ref 1.7–7.7)
Neutrophils Relative %: 43 %
Platelets: 104 10*3/uL — ABNORMAL LOW (ref 150–400)
RBC: 4.71 MIL/uL (ref 4.22–5.81)
RDW: 12.6 % (ref 11.5–15.5)
WBC: 5.7 10*3/uL (ref 4.0–10.5)
nRBC: 0 % (ref 0.0–0.2)

## 2021-08-09 LAB — TROPONIN I (HIGH SENSITIVITY)
Troponin I (High Sensitivity): 6 ng/L (ref <18)
Troponin I (High Sensitivity): 7 ng/L (ref <18)

## 2021-08-09 LAB — COMPREHENSIVE METABOLIC PANEL
ALT: 35 U/L (ref 0–44)
AST: 37 U/L (ref 15–41)
Albumin: 3.8 g/dL (ref 3.5–5.0)
Alkaline Phosphatase: 57 U/L (ref 38–126)
Anion gap: 11 (ref 5–15)
BUN: 23 mg/dL (ref 8–23)
CO2: 20 mmol/L — ABNORMAL LOW (ref 22–32)
Calcium: 9.2 mg/dL (ref 8.9–10.3)
Chloride: 107 mmol/L (ref 98–111)
Creatinine, Ser: 1.14 mg/dL (ref 0.61–1.24)
GFR, Estimated: >60 mL/min (ref >60)
Glucose, Bld: 134 mg/dL — ABNORMAL HIGH (ref 70–99)
Potassium: 3.6 mmol/L (ref 3.5–5.1)
Sodium: 138 mmol/L (ref 135–145)
Total Bilirubin: 0.7 mg/dL (ref 0.3–1.2)
Total Protein: 8.6 g/dL — ABNORMAL HIGH (ref 6.5–8.1)

## 2021-08-09 LAB — LIPASE, BLOOD: Lipase: 42 U/L (ref 11–51)

## 2021-08-09 LAB — BRAIN NATRIURETIC PEPTIDE: B Natriuretic Peptide: 86.8 pg/mL (ref 0.0–100.0)

## 2021-08-09 MED ORDER — ASPIRIN 81 MG PO CHEW: 324.0000 mg | CHEWABLE_TABLET | Freq: Once | ORAL | Status: DC

## 2021-08-09 NOTE — ED Provider Notes (Signed)
Emergency Medicine Provider Triage Evaluation Note  [name] , a 75 y.o. male  was evaluated in triage.  Pt complains of an onset chest pain just prior to calling EMS which woke him from sleep.  Patient reports chest pain is left-sided and severe.  Reports he has nausea and diaphoresis with it.  Has a history of coronary artery disease and a previous CABG.  Records reviewed.  Patient also has a history of high cholesterol, chronic alcohol abuse, diastolic heart failure and hypertensive heart disease.  Treatments prior to arrival.  Patient refused aspirin, nitroglycerin and IV from EMS prior to arrival.  Review of Systems  Positive: Chest pain, shortness of breath Negative: Fever, chills  Physical Exam  BP (!) 168/75 (BP Location: Right Arm)   Pulse 73   Temp 98.1 F (36.7 C) (Oral)   Resp 18   Ht 5\' 10"  (1.778 m)   Wt 115 kg   SpO2 100%   BMI 36.38 kg/m  Gen:   Awake, no distress  Resp:  Normal effort  MSK:   Moves extremities without difficulty  Other:  RRR, clear and equal breath sounds  Medical Decision Making  Medically screening exam initiated at 2:57 AM.  Appropriate orders placed.  [name] was informed that the remainder of the evaluation will be completed by another provider, this initial triage assessment does not replace that evaluation, and the importance of remaining in the ED until their evaluation is complete.  Patient with cardiac history here with chest pain.  EKG, labs and x-ray ordered.   [name], [name] 08/09/21 0258    10/09/21, MD 08/09/21 [phone]

## 2021-08-09 NOTE — ED Notes (Addendum)
Pt stated he was leaving ED. PT encouraged to stay. Pt declined.

## 2021-08-09 NOTE — ED Triage Notes (Signed)
Patient arrived with EMS from home reports left chest pain with palpitations with SOB ,diaphoresis , no emesis , hypertensive 222/120 . His cardiologist is Dr. Katrinka Blazing . EMS reported patient refused medications from EMS .

## 2021-08-11 NOTE — Progress Notes (Signed)
Cardiology Office Note:    Date:  08/12/2021   ID:  DAJION BICKFORD, DOB 1946-01-17, MRN [id number]  PCP:  Horald Pollen, MD  Cardiologist:  Sinclair Grooms, MD   Referring MD: Horald Pollen, *   Chief Complaint  Patient presents with   Dizziness    Dizzy Bradycardia     History of Present Illness:    [name] is a 75 y.o. male with a hx of CAD s/p CABG x3, with early graft failure and re-do surgery (2006), HTN, HLD, PTSD, obesity with stable chest pain.   Was seen in the emergency room recently because he felt lightheaded.  The EKG done in the emergency room was unremarkable.  States he was having muscle spasms as a reason for showing up in the emergency room.  He is not having angina.  He is compliant with his medications.  Takes furosemide as needed.  He takes 40 mg as needed based upon swelling.  Even though on his medication list, furosemide is listed, he again tells me that he is not taking furosemide, losartan recently.  The only medication that he uses his Toprol-XL.  On check-in today the heart rate was 47.  With walking it only increased to 63 bpm.  Past Medical History:  Diagnosis Date   Anginal pain ([place]) 01/2005   Anxiety    Arthritis    "knees"   Chronic lower back pain    Coronary artery disease    Depression    Hepatitis    "don't know what kind; they treated me for it; was a long time ago" (12/11/2018)   High cholesterol    History of bronchitis    "used to get it alot" (08/25/2012)   Hypertension    Panic attacks    PTSD (post-traumatic stress disorder)    "have been treated in the past" (08/25/2012)    Past Surgical History:  Procedure Laterality Date   CARDIAC CATHETERIZATION  01/2005   CORONARY ARTERY BYPASS GRAFT  01/2005   CABG X3   Shrapnel Left 1969   LLE; left lateral thumb (required grafting)   VARICOSE VEIN SURGERY  1980's   LLE    Current Medications: Current Meds  Medication Sig   benzonatate (TESSALON)  100 MG capsule Take 1 capsule (100 mg total) by mouth every 8 (eight) hours.   Evolocumab (REPATHA SURECLICK) 643 MG/ML SOAJ Inject 140 mg into the skin every 14 (fourteen) days.   furosemide (LASIX) 20 MG tablet TAKE 2 TABLETS BY MOUTH DAILY AS NEEDED.   metoprolol succinate (TOPROL-XL) 25 MG 24 hr tablet Take 1 tablet (25 mg total) by mouth daily.   pantoprazole (PROTONIX) 20 MG tablet Take 1 tablet (20 mg total) by mouth 2 (two) times daily before a meal.     Allergies:   Codeine and Vicodin [hydrocodone-acetaminophen]   Social History   Socioeconomic History   Marital status: Divorced    Spouse name: Not on file   Number of children: Not on file   Years of education: Not on file   Highest education level: Not on file  Occupational History   Not on file  Tobacco Use   Smoking status: Former    Packs/day: 2.00    Years: 5.00    Pack years: 10.00    Types: Cigarettes   Smokeless tobacco: Never   Tobacco comments:    08/25/2012 "quit smoking cigarettes 40 yr ago"  Vaping Use   Vaping Use: Never  used  Substance and Sexual Activity   Alcohol use: Yes    Alcohol/week: 60.0 standard drinks    Types: 60 Standard drinks or equivalent per week    Comment: 12/11/2018 "~ 1 pint of vodka/day"   Drug use: Yes    Types: Marijuana    Comment: 08/25/2012 "last marijuana 10 years ago"   Sexual activity: Yes  Other Topics Concern   Not on file  Social History Narrative   Not on file   Social Determinants of Health   Financial Resource Strain: Not on file  Food Insecurity: Not on file  Transportation Needs: Not on file  Physical Activity: Not on file  Stress: Not on file  Social Connections: Not on file     Family History: The patient's family history includes Cancer in his brother; Diabetes in his mother; Heart disease in his father.  ROS:   Please see the history of present illness.    Has sciatica that involves his right extremity.  All other systems reviewed and are  negative.  EKGs/Labs/Other Studies Reviewed:    The following studies were reviewed today: No new data  EKG:  EKG August 10, 2021 normal sinus rhythm, first-degree AV block, nonspecific ST-T wave abnormality, otherwise okay.  ECG today demonstrates sinus bradycardia, 55 bpm, first-degree AV block at 232 ms, which is slightly longer than the tracing performed 2 days ago.  Recent Labs: 08/09/2021: ALT 35; B Natriuretic Peptide 86.8; BUN 23; Creatinine, Ser 1.14; Hemoglobin 14.0; Platelets 104; Potassium 3.6; Sodium 138  Recent Lipid Panel    Component Value Date/Time   CHOL 118 06/16/2021 1133   CHOL 124 05/26/2021 1042   TRIG 193.0 (H) 06/16/2021 1133   HDL 36.70 (L) 06/16/2021 1133   HDL 38 (L) 05/26/2021 1042   CHOLHDL 3 06/16/2021 1133   VLDL 38.6 06/16/2021 1133   LDLCALC 42 06/16/2021 1133   LDLCALC 61 05/26/2021 1042    Physical Exam:    VS:  BP (!) 150/80   Pulse (!) 47   Ht 5' 10"  (1.778 m)   Wt 231 lb 3.2 oz (104.9 kg)   SpO2 99%   BMI 33.17 kg/m     Wt Readings from Last 3 Encounters:  08/12/21 231 lb 3.2 oz (104.9 kg)  08/09/21 253 lb 8.5 oz (115 kg)  06/16/21 228 lb (103.4 kg)     GEN: Abdominal obesity. No acute distress HEENT: Normal NECK: No JVD. LYMPHATICS: No lymphadenopathy CARDIAC: 2/6 systolic right upper sternal aortic valve murmur. R with bradycardia andno gallop, or edema. VASCULAR: normal Pulses. No bruits. RESPIRATORY:  Clear to auscultation without rales, wheezing or rhonchi  ABDOMEN: Soft, non-tender, non-distended, No pulsatile mass, MUSCULOSKELETAL: No deformity  SKIN: Warm and dry NEUROLOGIC:  Alert and oriented x 3 PSYCHIATRIC:  Normal affect   ASSESSMENT:    1. Coronary artery disease involving coronary bypass graft of native heart without angina pectoris   2. Chronic diastolic heart failure ([place])   3. Essential hypertension   4. Other hyperlipidemia   5. Chronic alcohol abuse    PLAN:    In order of problems listed  above:  Not having angina.  Secondary prevention discussed. No evidence of volume overload Elevated blood pressure.  Did not take furosemide this morning.  States that he did have metoprolol 25 mg and losartan 50 mg.  Have decided today to combine losartan and HCTZ 50/12.5 mg 1 daily.  Be met in 7 to 10 days.  Target blood pressure less  than 140/90 and ideally 130/80 mmHg. Continue evolocumab.  Most recent LDL was 42 in July. Did not discuss Cardiac with inability to increase heart rate above 65 with walking briskly for 30 yards.  Discontinue metoprolol.  Continue to monitor blood pressure and heart rate at home.   Medication Adjustments/Labs and Tests Ordered: Current medicines are reviewed at length with the patient today.  Concerns regarding medicines are outlined above.  No orders of the defined types were placed in this encounter.  No orders of the defined types were placed in this encounter.   There are no Patient Instructions on file for this visit.   Signed, Sinclair Grooms, MD  08/12/2021 10:57 AM    [place]

## 2021-08-12 ENCOUNTER — Ambulatory Visit (INDEPENDENT_AMBULATORY_CARE_PROVIDER_SITE_OTHER): Payer: Medicare Other | Admitting: Interventional Cardiology

## 2021-08-12 ENCOUNTER — Encounter: Payer: Self-pay | Admitting: Interventional Cardiology

## 2021-08-12 ENCOUNTER — Other Ambulatory Visit: Payer: Self-pay

## 2021-08-12 VITALS — BP 150/80 | HR 47 | Ht 70.0 in | Wt 231.2 lb

## 2021-08-12 DIAGNOSIS — I10 Essential (primary) hypertension: Secondary | ICD-10-CM | POA: Diagnosis not present

## 2021-08-12 DIAGNOSIS — I5032 Chronic diastolic (congestive) heart failure: Secondary | ICD-10-CM | POA: Diagnosis not present

## 2021-08-12 DIAGNOSIS — R001 Bradycardia, unspecified: Secondary | ICD-10-CM | POA: Diagnosis not present

## 2021-08-12 DIAGNOSIS — E7849 Other hyperlipidemia: Secondary | ICD-10-CM

## 2021-08-12 DIAGNOSIS — I25810 Atherosclerosis of coronary artery bypass graft(s) without angina pectoris: Secondary | ICD-10-CM | POA: Diagnosis not present

## 2021-08-12 DIAGNOSIS — F1010 Alcohol abuse, uncomplicated: Secondary | ICD-10-CM

## 2021-08-12 MED ORDER — LOSARTAN POTASSIUM-HCTZ 50-12.5 MG PO TABS: 1.0000 | ORAL_TABLET | Freq: Every day | ORAL | 3 refills | Status: DC

## 2021-08-12 NOTE — Patient Instructions (Signed)
Medication Instructions:  1) DISCONTINUE Furosemide and Metoprolol 2) START Losartan/HCTZ 50/12.5mg  once daily  *If you need a refill on your cardiac medications before your next appointment, please call your pharmacy*   Lab Work: BMET in 7-10 days  If you have labs (blood work) drawn today and your tests are completely normal, you will receive your results only by: MyChart Message (if you have MyChart) OR A paper copy in the mail If you have any lab test that is abnormal or we need to change your treatment, we will call you to review the results.   Testing/Procedures: None   Follow-Up: At [facility], you and your health needs are our priority.  As part of our continuing mission to provide you with exceptional heart care, we have created designated Provider Care Teams.  These Care Teams include your primary Cardiologist (physician) and Advanced Practice Providers (APPs -  Physician Assistants and Nurse Practitioners) who all work together to provide you with the care you need, when you need it.  We recommend signing up for the patient portal called "MyChart".  Sign up information is provided on this After Visit Summary.  MyChart is used to connect with patients for Virtual Visits (Telemedicine).  Patients are able to view lab/test results, encounter notes, upcoming appointments, etc.  Non-urgent messages can be sent to your provider as well.   To learn more about what you can do with MyChart, go to [email or web address].    Your next appointment:   1 year(s)  The format for your next appointment:   In Person  Provider:   You may see Lesleigh Noe, MD or one of the following Advanced Practice Providers on your designated Care Team:   Nada Boozer, NP   Other Instructions

## 2021-08-16 ENCOUNTER — Ambulatory Visit: Payer: Medicare Other

## 2021-08-16 NOTE — Progress Notes (Deleted)
Subjective:   [name] is a 75 y.o. male who presents for Medicare Annual/Subsequent preventive examination.  I connected with  [name] on 08/16/21 by audio enabled telemedicine application and verified that I am speaking with the correct person using two identifiers.   I discussed the limitations of evaluation and management by telemedicine. The patient expressed understanding and agreed to proceed.   Location of patient: Home Location of provider: Home Office Persons participating in visit: [name] (patient) & Silvestre Moment, CMA  Review of Systems    Defer to PCP       Objective:    There were no vitals filed for this visit. There is no height or weight on file to calculate BMI.  Advanced Directives 05/24/2021 05/17/2021 04/29/2020 10/09/2019 06/15/2019 06/15/2019 03/08/2019  Does Patient Have a Medical Advance Directive? No No No No No No No  Would patient like information on creating a medical advance directive? No - Patient declined No - Patient declined - No - Patient declined No - Patient declined No - Patient declined No - Patient declined    Current Medications (verified) Outpatient Encounter Medications as of 08/16/2021  Medication Sig   benzonatate (TESSALON) 100 MG capsule Take 1 capsule (100 mg total) by mouth every 8 (eight) hours.   Evolocumab (REPATHA SURECLICK) 140 MG/ML SOAJ Inject 140 mg into the skin every 14 (fourteen) days.   losartan-hydrochlorothiazide (HYZAAR) 50-12.5 MG tablet Take 1 tablet by mouth daily.   pantoprazole (PROTONIX) 20 MG tablet Take 1 tablet (20 mg total) by mouth 2 (two) times daily before a meal.   sildenafil (VIAGRA) 100 MG tablet Take 100 mg by mouth daily as needed.   [DISCONTINUED] losartan (COZAAR) 50 MG tablet Take 50 mg by mouth daily.   No facility-administered encounter medications on file as of 08/16/2021.    Allergies (verified) Codeine and Vicodin [hydrocodone-acetaminophen]   History: Past Medical History:   Diagnosis Date   Anginal pain ([facility]) 01/2005   Anxiety    Arthritis    "knees"   Chronic lower back pain    Coronary artery disease    Depression    Hepatitis    "don't know what kind; they treated me for it; was a long time ago" (12/11/2018)   High cholesterol    History of bronchitis    "used to get it alot" (08/25/2012)   Hypertension    Panic attacks    PTSD (post-traumatic stress disorder)    "have been treated in the past" (08/25/2012)   Past Surgical History:  Procedure Laterality Date   CARDIAC CATHETERIZATION  01/2005   CORONARY ARTERY BYPASS GRAFT  01/2005   CABG X3   Shrapnel Left 1969   LLE; left lateral thumb (required grafting)   VARICOSE VEIN SURGERY  1980's   LLE   Family History  Problem Relation Age of Onset   Diabetes Mother    Heart disease Father    Cancer Brother    Social History   Socioeconomic History   Marital status: Divorced    Spouse name: Not on file   Number of children: Not on file   Years of education: Not on file   Highest education level: Not on file  Occupational History   Not on file  Tobacco Use   Smoking status: Former    Packs/day: 2.00    Years: 5.00    Pack years: 10.00    Types: Cigarettes   Smokeless tobacco: Never   Tobacco comments:  08/25/2012 "quit smoking cigarettes 40 yr ago"  Vaping Use   Vaping Use: Never used  Substance and Sexual Activity   Alcohol use: Yes    Alcohol/week: 60.0 standard drinks    Types: 60 Standard drinks or equivalent per week    Comment: 12/11/2018 "~ 1 pint of vodka/day"   Drug use: Yes    Types: Marijuana    Comment: 08/25/2012 "last marijuana 10 years ago"   Sexual activity: Yes  Other Topics Concern   Not on file  Social History Narrative   Not on file   Social Determinants of Health   Financial Resource Strain: Not on file  Food Insecurity: Not on file  Transportation Needs: Not on file  Physical Activity: Not on file  Stress: Not on file  Social Connections: Not on  file    Tobacco Counseling Counseling given: Not Answered Tobacco comments: 08/25/2012 "quit smoking cigarettes 40 yr ago"   Clinical Intake:                 Diabetic? No         Activities of Daily Living No flowsheet data found.  Patient Care Team: Georgina Quint, MD as PCP - General (Internal Medicine) Lyn Records, MD as PCP - Cardiology (Cardiology)  Indicate any recent Medical Services you may have received from other than [facility] providers in the past year (date may be approximate).     Assessment:   This is a routine wellness examination for [name].  Hearing/Vision screen No results found.  Dietary issues and exercise activities discussed:     Goals Addressed   None    Depression Screen PHQ 2/9 Scores 12/11/2020 08/11/2020 05/29/2020 05/06/2020 10/09/2019 03/29/2019 03/21/2019  PHQ - 2 Score 4 5 0 4 0 0 0  PHQ- 9 Score 14 20 0 15 - - -    Fall Risk Fall Risk  12/11/2020 05/29/2020 05/06/2020 02/27/2020 10/09/2019  Falls in the past year? 0 0 0 0 0  Number falls in past yr: - 0 - 0 0  Injury with Fall? - 0 - 0 0  Comment - - - - -  Risk for fall due to : - - - - -  Risk for fall due to: Comment - - - - -  Follow up Falls evaluation completed Falls evaluation completed Falls evaluation completed Falls evaluation completed Falls evaluation completed;Education provided    FALL RISK PREVENTION PERTAINING TO THE HOME:  Any stairs in or around the home? {YES/NO:21197} If so, are there any without handrails? {YES/NO:21197} Home free of loose throw rugs in walkways, pet beds, electrical cords, etc? {YES/NO:21197} Adequate lighting in your home to reduce risk of falls? {YES/NO:21197}  ASSISTIVE DEVICES UTILIZED TO PREVENT FALLS:  Life alert? {YES/NO:21197} Use of a Ronald, walker or w/c? {YES/NO:21197} Grab bars in the bathroom? {YES/NO:21197} Shower chair or bench in shower? {YES/NO:21197} Elevated toilet seat or a handicapped toilet?  {YES/NO:21197}  TIMED UP AND GO:  Was the test performed?  n/a .  Length of time to ambulate 10 feet: n/a sec.     Cognitive Function:     6CIT Screen 10/09/2019  What Year? 0 points  What month? 0 points  What time? 0 points  Count back from 20 0 points  Months in reverse 0 points  Repeat phrase 0 points  Total Score 0    Immunizations Immunization History  Administered Date(s) Administered   PFIZER(Purple Top)SARS-COV-2 Vaccination 07/15/2020, 08/05/2020   Tdap 02/11/2013  TDAP status: Up to date  Flu Vaccine status: Due, Education has been provided regarding the importance of this vaccine. Advised may receive this vaccine at local pharmacy or Health Dept. Aware to provide a copy of the vaccination record if obtained from local pharmacy or Health Dept. Verbalized acceptance and understanding.  Pneumococcal vaccine status: Due, Education has been provided regarding the importance of this vaccine. Advised may receive this vaccine at local pharmacy or Health Dept. Aware to provide a copy of the vaccination record if obtained from local pharmacy or Health Dept. Verbalized acceptance and understanding.  Covid-19 vaccine status: Information provided on how to obtain vaccines.   Qualifies for Shingles Vaccine? Yes   Zostavax completed No   Shingrix Completed?: No.    Education has been provided regarding the importance of this vaccine. Patient has been advised to call insurance company to determine out of pocket expense if they have not yet received this vaccine. Advised may also receive vaccine at local pharmacy or Health Dept. Verbalized acceptance and understanding.  Screening Tests Health Maintenance  Topic Date Due   Zoster Vaccines- Shingrix (1 of 2) Never done   COVID-19 Vaccine (3 - Booster for Pfizer series) 01/05/2021   INFLUENZA VACCINE  Never done   COLONOSCOPY (Pts 45-4yrs Insurance coverage will need to be confirmed)  12/11/2021 (Originally 08/09/1991)    TETANUS/TDAP  02/12/2023   Hepatitis C Screening  Completed   HPV VACCINES  Aged Out    Health Maintenance  Health Maintenance Due  Topic Date Due   Zoster Vaccines- Shingrix (1 of 2) Never done   COVID-19 Vaccine (3 - Booster for Pfizer series) 01/05/2021   INFLUENZA VACCINE  Never done    Coloerctal Cancer Screening: No record on file  Lung Cancer Screening: (Low Dose CT Chest recommended if Age 11-80 years, 30 pack-year currently smoking OR have quit w/in 15years.) does qualify.   Lung Cancer Screening Referral: Defer to PCP  Additional Screening:  Hepatitis C Screening: does qualify; Completed 02/25/2019  Vision Screening: Recommended annual ophthalmology exams for early detection of glaucoma and other disorders of the eye. Is the patient up to date with their annual eye exam?  {YES/NO:21197} Who is the provider or what is the name of the office in which the patient attends annual eye exams? *** If pt is not established with a provider, would they like to be referred to a provider to establish care? {YES/NO:21197}.   Dental Screening: Recommended annual dental exams for proper oral hygiene  Community Resource Referral / Chronic Care Management: CRR required this visit?  {YES/NO:21197}  CCM required this visit?  {YES/NO:21197}     Plan:     I have personally reviewed and noted the following in the patient's chart:   Medical and social history Use of alcohol, tobacco or illicit drugs  Current medications and supplements including opioid prescriptions. {Opioid Prescriptions:[phone]} Functional ability and status Nutritional status Physical activity Advanced directives List of other physicians Hospitalizations, surgeries, and ER visits in previous 12 months Vitals Screenings to include cognitive, depression, and falls Referrals and appointments  In addition, I have reviewed and discussed with patient certain preventive protocols, quality metrics, and best  practice recommendations. A written personalized care plan for preventive services as well as general preventive health recommendations were provided to patient.     Thurmond Butts, CMA   08/16/2021   Nurse Notes: ***

## 2021-08-19 ENCOUNTER — Other Ambulatory Visit: Payer: Medicare Other | Admitting: *Deleted

## 2021-08-19 ENCOUNTER — Other Ambulatory Visit: Payer: Self-pay

## 2021-08-19 DIAGNOSIS — I25810 Atherosclerosis of coronary artery bypass graft(s) without angina pectoris: Secondary | ICD-10-CM | POA: Diagnosis not present

## 2021-08-19 DIAGNOSIS — I5032 Chronic diastolic (congestive) heart failure: Secondary | ICD-10-CM | POA: Diagnosis not present

## 2021-08-19 LAB — BASIC METABOLIC PANEL
BUN/Creatinine Ratio: 23 (ref 10–24)
BUN: 27 mg/dL (ref 8–27)
CO2: 20 mmol/L (ref 20–29)
Calcium: 9.4 mg/dL (ref 8.6–10.2)
Chloride: 101 mmol/L (ref 96–106)
Creatinine, Ser: 1.18 mg/dL (ref 0.76–1.27)
Glucose: 104 mg/dL — ABNORMAL HIGH (ref 65–99)
Potassium: 4.3 mmol/L (ref 3.5–5.2)
Sodium: 136 mmol/L (ref 134–144)
eGFR: 65 mL/min/{1.73_m2} (ref >59)

## 2021-09-15 ENCOUNTER — Telehealth: Payer: Self-pay | Admitting: Interventional Cardiology

## 2021-09-15 DIAGNOSIS — M7989 Other specified soft tissue disorders: Secondary | ICD-10-CM

## 2021-09-15 DIAGNOSIS — M79605 Pain in left leg: Secondary | ICD-10-CM

## 2021-09-15 NOTE — Telephone Encounter (Signed)
Pt states for about a week and a half he has had dizziness, blurred vision and abnormal gait.  Starts after he takes the Losartan-HCTZ.  He has not taken it today and no issues so far.  Does not check his BP at home.  States he feels thirsty all the time, has dark urine and his urine smells.  Advised pt to speak with PCP about possible UTI.  Scheduled pt to come into the office to see HTN clinic on Thursday.  Pt did mention that he has been eating a lot more sweets recently.  Pt also mentioned that he is having swelling in both legs, left much worse than right.  Has pain in the left leg that is persistent.  Spoke with Dr. Ladona Ridgel, DOD and he agreed with ordering a LEV doppler to rule out DVT.  Order placed and pt aware.

## 2021-09-15 NOTE — Telephone Encounter (Signed)
Pt c/o medication issue:  1. Name of Medication: losartan-hydrochlorothiazide (HYZAAR) 50-12.5 MG tablet  2. How are you currently taking this medication (dosage and times per day)? Once daily  3. Are you having a reaction (difficulty breathing--STAT)? no  4. What is your medication issue? Vision blurry, makes him feel a little dizzy off balance. He thinks it's affecting his circulation, says left foot is giving him problems.

## 2021-09-16 ENCOUNTER — Other Ambulatory Visit: Payer: Self-pay

## 2021-09-16 ENCOUNTER — Ambulatory Visit (HOSPITAL_COMMUNITY)
Admission: RE | Admit: 2021-09-16 | Discharge: 2021-09-16 | Disposition: A | Discharge status: Home / Self Care | Payer: Medicare Other | Source: Ambulatory Visit | Attending: Cardiology | Admitting: Cardiology

## 2021-09-16 DIAGNOSIS — M79605 Pain in left leg: Secondary | ICD-10-CM | POA: Insufficient documentation

## 2021-09-16 DIAGNOSIS — M7989 Other specified soft tissue disorders: Secondary | ICD-10-CM

## 2021-09-16 NOTE — Progress Notes (Signed)
Patient ID: [name]                 DOB: 05-31-1946                      MRN: [id number]     HPI: [name] is a 75 y.o. male referred by Dr. Katrinka Blazing to HTN clinic. PMH is significant for HTN, mvCAD with s/p CABG x3 with early graft failure and repeat surgery (2006), stable chest pain, chronic diastolic heart failure with LVH, HLD, and cirrhosis of the liver for which he has been referred to [facility]. Most recent echo showed EF 50-55% on 01/2014.  He was recently seen in emergency room on 9/11 after reporting left chest pain with palpitations with SOB, diaphoresis. BP on presentation was 168/75. He refused medications from EMS. He shortly left the ED after being encouraged to stay. Patient was then seen on 9/14 by Dr. Katrinka Blazing for CAD follow-up. At that visit, he states he was not taking his losartan or furosemide and only taking metoprolol. Of note, his HR in office was 47, improving to 63 only after walking, so metoprolol and furosemide was discontinued, and patient was started on losartan-HCTZ 50-12.5mg  daily.  Pt called clinic on 10/18 reporting dizziness, blurred vision, and abnormal gait after taking losartan-HCTZ for the previous week and a half. He stated when not taking it, he feels fine. He also reported excessive thirst, and dark smelly urine and he mentions he has been eating more sweets. He also endorses swelling in both legs, with persistent pain in the left leg. Venous doppler on 10/18  ruled out DVT. He has been referred to HTN for symptoms.   Today, patient presents to clinic in good spiirts. He takes his losartan-HCTZ in the morning, and still feels mild dizziness, blurred vision and light-headedness when walking. Denies swelling. He states the last time he had these symptoms are when his BP was high. He mentions using sildenafil around three times a week. He does not check his BP at home. He mentions he is also curious about labwork (A1c, potassium level, LFTs)  Current HTN  meds: losartan-HCTZ 50-12.5 mg daily Other Meds: sildenafil 100 mg prn Previously tried: metoprolol succinate 25 mg daily, amlodipine 10 m gdaily, losartan 50 mg daily, hydralazine 25 m gq6h prn, lisinopril 20 mg daily BP goal: <130/57mmHg  Family History: family history includes Cancer in his brother; Diabetes in his mother; Heart disease in his father.  Social History:  reports that he has quit smoking. His smoking use included cigarettes. He has a 10.00 pack-year smoking history. He has never used smokeless tobacco. He reports current alcohol use of about 60.0 standard drinks per week. He reports current drug use. Drug: Marijuana.  Diet: Has been eating lean protein like chicken and [place]. Eats oatmeal for breakfast, and eats lots of fruit that contain potassium. Does not drink a lot of coffee or soda; drinks mostly water  Home BP readings: n/a  Wt Readings from Last 3 Encounters:  08/12/21 231 lb 3.2 oz (104.9 kg)  08/09/21 253 lb 8.5 oz (115 kg)  06/16/21 228 lb (103.4 kg)   BP Readings from Last 3 Encounters:  08/12/21 (!) 150/80  08/09/21 (!) 159/74  06/16/21 138/68   Pulse Readings from Last 3 Encounters:  08/12/21 (!) 47  08/09/21 (!) 53  06/16/21 (!) 54    Renal function: CrCl cannot be calculated (Patient's most recent lab result is older than  the maximum 21 days allowed.).  Past Medical History:  Diagnosis Date   Anginal pain (HCC) 01/2005   Anxiety    Arthritis    "knees"   Chronic lower back pain    Coronary artery disease    Depression    Hepatitis    "don't know what kind; they treated me for it; was a long time ago" (12/11/2018)   High cholesterol    History of bronchitis    "used to get it alot" (08/25/2012)   Hypertension    Panic attacks    PTSD (post-traumatic stress disorder)    "have been treated in the past" (08/25/2012)    Current Outpatient Medications on File Prior to Visit  Medication Sig Dispense Refill   benzonatate (TESSALON) 100 MG  capsule Take 1 capsule (100 mg total) by mouth every 8 (eight) hours. 21 capsule 0   Evolocumab (REPATHA SURECLICK) 140 MG/ML SOAJ Inject 140 mg into the skin every 14 (fourteen) days. 2 mL 11   losartan-hydrochlorothiazide (HYZAAR) 50-12.5 MG tablet Take 1 tablet by mouth daily. 90 tablet 3   pantoprazole (PROTONIX) 20 MG tablet Take 1 tablet (20 mg total) by mouth 2 (two) times daily before a meal. 60 tablet 0   sildenafil (VIAGRA) 100 MG tablet Take 100 mg by mouth daily as needed.     [DISCONTINUED] losartan (COZAAR) 50 MG tablet Take 50 mg by mouth daily.     No current facility-administered medications on file prior to visit.    Allergies  Allergen Reactions   Codeine Palpitations   Vicodin [Hydrocodone-Acetaminophen] Nausea And Vomiting, Swelling and Palpitations    There were no vitals taken for this visit.   Assessment/Plan:  1. Hypertension - Patient's BP has improved notably and is now at goal of <130/54mmHg. Discussed changing to alternative BP regimen given pt's sx, however he prefers to continue on losartan-HCTZ 50-12.5mg  daily since it's been effective in controlling his BP. Counseled patient to purchase a blood pressure cuff and monitor at home and call clinic with any blood pressure concerns.  Patient requested potassium, liver and diabetes labs. Will check CMET and A1c. Gave patient number for  GI to schedule follow-up visit for cirrhosis noted on abdominal CT.  Counseled patient to encourage healthy eating habits, and aim for >150 min/week of physical activity.   Hermenia Fiscal  Pharm D. Candidate  [facility] [address]  Moquino E. Supple, PharmD, BCACP, CPP [facility] [address] [place], [place] [address] Phone: [phone]; Fax: [phone] 09/17/2021 3:20 PM

## 2021-09-17 ENCOUNTER — Ambulatory Visit: Payer: Medicare Other | Admitting: Pharmacist

## 2021-09-17 VITALS — BP 127/60 | HR 64

## 2021-09-17 DIAGNOSIS — R7303 Prediabetes: Secondary | ICD-10-CM

## 2021-09-17 DIAGNOSIS — I10 Essential (primary) hypertension: Secondary | ICD-10-CM | POA: Diagnosis not present

## 2021-09-17 LAB — COMPREHENSIVE METABOLIC PANEL
ALT: 30 IU/L (ref 0–44)
AST: 28 IU/L (ref 0–40)
Albumin/Globulin Ratio: 1.1 — ABNORMAL LOW (ref 1.2–2.2)
Albumin: 4.4 g/dL (ref 3.7–4.7)
Alkaline Phosphatase: 68 IU/L (ref 44–121)
BUN/Creatinine Ratio: 19 (ref 10–24)
BUN: 23 mg/dL (ref 8–27)
Bilirubin Total: 0.6 mg/dL (ref 0.0–1.2)
CO2: 22 mmol/L (ref 20–29)
Calcium: 9.3 mg/dL (ref 8.6–10.2)
Chloride: 103 mmol/L (ref 96–106)
Creatinine, Ser: 1.18 mg/dL (ref 0.76–1.27)
Globulin, Total: 4 g/dL (ref 1.5–4.5)
Glucose: 87 mg/dL (ref 70–99)
Potassium: 4.3 mmol/L (ref 3.5–5.2)
Sodium: 139 mmol/L (ref 134–144)
Total Protein: 8.4 g/dL (ref 6.0–8.5)
eGFR: 65 mL/min/{1.73_m2} (ref >59)

## 2021-09-17 LAB — HEMOGLOBIN A1C
Est. average glucose Bld gHb Est-mCnc: 123 mg/dL
Hgb A1c MFr Bld: 5.9 % — ABNORMAL HIGH (ref 4.8–5.6)

## 2021-09-17 NOTE — Patient Instructions (Addendum)
It was nice to meet you today! Your blood pressure in office at goal of <130/80.  Please purchase a blood pressure cuff at your local pharmacy and begin checking and recording your blood pressure at home.  Continue taking losartan-hydrochlorothiazide 50-12.5 mg daily.   Call clinic with any blood pressure concerns.   Call [facility] for follow up visit, your primary care doctor made a referral back in July #[phone]

## 2021-09-21 ENCOUNTER — Telehealth: Payer: Self-pay | Admitting: Pharmacist

## 2021-09-21 NOTE — Telephone Encounter (Signed)
I have advised pt on both voicemails to call me back on my direct line instead of the general one. Called pt back again, no answer, again reminded him to call me on my direct line instead of the main number.

## 2021-09-21 NOTE — Telephone Encounter (Signed)
Returned call to pt and left 2nd message.

## 2021-09-21 NOTE — Telephone Encounter (Signed)
Follow Up:     Patient s returning Megan's call from today.i

## 2021-09-21 NOTE — Telephone Encounter (Signed)
Patient returned call

## 2021-09-22 MED ORDER — LOSARTAN POTASSIUM 50 MG PO TABS: 50.0000 mg | ORAL_TABLET | Freq: Every day | ORAL | 5 refills | Status: DC

## 2021-09-22 NOTE — Telephone Encounter (Signed)
Spoke with pt about lab results. He states he thinks he's having a gout flare and wonders if any of his medications could contribute. Discussed that HCTZ can contribute. Will stop losartan-HCTZ and replace it with just losartan. Discussed increasing to 100mg  daily but pt prefers to try 50mg  daily first. Scheduled PharmD follow up in 3 weeks for BP check and BMET.

## 2021-10-13 ENCOUNTER — Telehealth: Payer: Self-pay | Admitting: Pharmacist

## 2021-10-13 ENCOUNTER — Ambulatory Visit: Payer: Medicare Other | Admitting: Pharmacist

## 2021-10-13 NOTE — Progress Notes (Deleted)
Patient ID: [name]                 DOB: 19-Mar-1946                      MRN: [id number]     HPI: [name] is a 75 y.o. male referred by Dr. Katrinka Blazing to HTN clinic. PMH is significant for HTN, mvCAD with s/p CABG x3 with early graft failure and repeat surgery (2006), stable chest pain, chronic diastolic heart failure with LVH, HLD, and cirrhosis of the liver for which he has been referred to [facility]. Most recent echo showed EF 50-55% on 01/2014.  He was recently seen in emergency room on 9/11 after reporting left chest pain with palpitations with SOB, diaphoresis. BP on presentation was 168/75. He refused medications from EMS. He shortly left the ED after being encouraged to stay. Patient was then seen on 9/14 by Dr. Katrinka Blazing for CAD follow-up. At that visit, he states he was not taking his losartan or furosemide and only taking metoprolol. Of note, his HR in office was 47, improving to 63 only after walking, so metoprolol and furosemide were discontinued and patient was started on losartan-HCTZ 50-12.5mg  daily.  Pt called clinic on 10/18 reporting dizziness, blurred vision, excessive thirst, and abnormal gait after taking losartan-HCTZ for the previous week and a half. He stated when not taking it, he feels fine. He also endorses swelling in both legs, with persistent pain in the left leg. Venous doppler on 10/18  ruled out DVT. He was seen by me 10/20 and BP had improved notably to 127/60. Pt preferred to continue on same medication regiment despite above listed side effects since it was controlling his BP well. However, when I called him with follow up lab results, he mentioned he thought he was having a gout flare and wondered if any of his meds could contribute. His HCTZ was stopped and he continues just on losartan with f/u today.  Inc losartan to 100 if needed or change to irbesartan 300 Did his prior SE resolve off hctz? What was prior issue on amlodipine?  Today, patient presents to  clinic in good spirits. He takes his losartan-HCTZ in the morning, and still feels mild dizziness, blurred vision and light-headedness when walking. Denies swelling. He states the last time he had these symptoms are when his BP was high. He mentions using sildenafil around three times a week. He does not check his BP at home. He mentions he is also curious about labwork (A1c, potassium level, LFTs)  Current HTN meds: losartan-HCTZ 50-12.5 mg daily  Previously tried: metoprolol succinate 25 mg daily (bradycardia), amlodipine 10 mg daily, losartan 50 mg daily, hydralazine 25 m gq6h prn, lisinopril 20 mg daily  BP goal: <130/63mmHg  Family History: family history includes Cancer in his brother; Diabetes in his mother; Heart disease in his father.  Social History:  reports that he has quit smoking. His smoking use included cigarettes. He has a 10.00 pack-year smoking history. He has never used smokeless tobacco. He reports current alcohol use of about 60.0 standard drinks per week. He reports current drug use. Drug: Marijuana.  Diet: Has been eating lean protein like chicken and [place]. Eats oatmeal for breakfast, and eats lots of fruit that contain potassium. Does not drink a lot of coffee or soda; drinks mostly water  Home BP readings: n/a  Wt Readings from Last 3 Encounters:  08/12/21 231 lb 3.2 oz (  104.9 kg)  08/09/21 253 lb 8.5 oz (115 kg)  06/16/21 228 lb (103.4 kg)   BP Readings from Last 3 Encounters:  09/17/21 127/60  08/12/21 (!) 150/80  08/09/21 (!) 159/74   Pulse Readings from Last 3 Encounters:  09/17/21 64  08/12/21 (!) 47  08/09/21 (!) 53    Renal function: CrCl cannot be calculated (Patient's most recent lab result is older than the maximum 21 days allowed.).  Past Medical History:  Diagnosis Date   Anginal pain (HCC) 01/2005   Anxiety    Arthritis    "knees"   Chronic lower back pain    Coronary artery disease    Depression    Hepatitis    "don't know what  kind; they treated me for it; was a long time ago" (12/11/2018)   High cholesterol    History of bronchitis    "used to get it alot" (08/25/2012)   Hypertension    Panic attacks    PTSD (post-traumatic stress disorder)    "have been treated in the past" (08/25/2012)    Current Outpatient Medications on File Prior to Visit  Medication Sig Dispense Refill   benzonatate (TESSALON) 100 MG capsule Take 1 capsule (100 mg total) by mouth every 8 (eight) hours. 21 capsule 0   Evolocumab (REPATHA SURECLICK) 140 MG/ML SOAJ Inject 140 mg into the skin every 14 (fourteen) days. 2 mL 11   losartan (COZAAR) 50 MG tablet Take 1 tablet (50 mg total) by mouth daily. 30 tablet 5   pantoprazole (PROTONIX) 20 MG tablet Take 1 tablet (20 mg total) by mouth 2 (two) times daily before a meal. 60 tablet 0   sildenafil (VIAGRA) 100 MG tablet Take 100 mg by mouth daily as needed.     No current facility-administered medications on file prior to visit.    Allergies  Allergen Reactions   Codeine Palpitations   Vicodin [Hydrocodone-Acetaminophen] Nausea And Vomiting, Swelling and Palpitations    There were no vitals taken for this visit.   Assessment/Plan:  1. Hypertension - Patient's BP has improved notably and is now at goal of <130/42mmHg.   Audreana Hancox E. York Valliant, PharmD, BCACP, CPP [facility] [address], [place], [place] [address] Phone: [phone]; Fax: [phone] 10/13/2021 7:35 AM

## 2021-10-13 NOTE — Telephone Encounter (Signed)
Pt missed f/u HTN appt today. Left message for pt to reschedule.

## 2021-10-19 ENCOUNTER — Telehealth: Payer: Self-pay | Admitting: Emergency Medicine

## 2021-10-19 ENCOUNTER — Other Ambulatory Visit: Payer: Self-pay | Admitting: Emergency Medicine

## 2021-10-19 MED ORDER — TRAMADOL HCL 50 MG PO TABS: 50.0000 mg | ORAL_TABLET | Freq: Three times a day (TID) | ORAL | 0 refills | Status: DC | PRN

## 2021-10-19 NOTE — Telephone Encounter (Signed)
Advil dual action which is a combination of ibuprofen and acetaminophen is a good over-the-counter option for him.  Thanks.

## 2021-10-19 NOTE — Telephone Encounter (Signed)
Prescription for tramadol sent to pharmacy of record.  Thanks.

## 2021-10-19 NOTE — Telephone Encounter (Signed)
Patient requesting a call back to discuss otc meds to take for leg until his appt w/ [facility] tomorrow 10-19-2021  Patient states he has pain and swelling in his leg

## 2021-10-20 ENCOUNTER — Ambulatory Visit: Payer: Self-pay

## 2021-10-20 ENCOUNTER — Ambulatory Visit (INDEPENDENT_AMBULATORY_CARE_PROVIDER_SITE_OTHER): Payer: Medicare Other | Admitting: Orthopaedic Surgery

## 2021-10-20 ENCOUNTER — Other Ambulatory Visit: Payer: Self-pay

## 2021-10-20 DIAGNOSIS — M1711 Unilateral primary osteoarthritis, right knee: Secondary | ICD-10-CM | POA: Diagnosis not present

## 2021-10-20 DIAGNOSIS — M5441 Lumbago with sciatica, right side: Secondary | ICD-10-CM

## 2021-10-20 DIAGNOSIS — G8929 Other chronic pain: Secondary | ICD-10-CM

## 2021-10-20 MED ORDER — LIDOCAINE HCL 1 % IJ SOLN
2.0000 mL | INTRAMUSCULAR | Status: AC | PRN
Start: 2021-10-20 — End: 2021-10-20
  Administered 2021-10-20: 2 mL

## 2021-10-20 MED ORDER — BUPIVACAINE HCL 0.25 % IJ SOLN
2.0000 mL | INTRAMUSCULAR | Status: AC | PRN
Start: 2021-10-20 — End: 2021-10-20
  Administered 2021-10-20: 2 mL via INTRA_ARTICULAR

## 2021-10-20 MED ORDER — METHYLPREDNISOLONE ACETATE 40 MG/ML IJ SUSP
40.0000 mg | INTRAMUSCULAR | Status: AC | PRN
Start: 2021-10-20 — End: 2021-10-20
  Administered 2021-10-20: 40 mg via INTRA_ARTICULAR

## 2021-10-20 MED ORDER — TRAMADOL HCL 50 MG PO TABS: 50.0000 mg | ORAL_TABLET | Freq: Two times a day (BID) | ORAL | 2 refills | Status: DC | PRN

## 2021-10-20 NOTE — Progress Notes (Signed)
Office Visit Note   Patient: [name]           Date of Birth: 1946/10/12           MRN: [id number] Visit Date: 10/20/2021              Requested by: Georgina Quint, MD [address] [place],  [place] [address] PCP: Georgina Quint, MD   Assessment & Plan: Visit Diagnoses:  1. Primary osteoarthritis of right knee   2. Chronic right-sided low back pain with right-sided sciatica     Plan: Impression is right knee degenerative joint disease and chronic right lower back pain with right lower extremity radiculopathy.  In regards to the right knee, we have discussed cortisone injection for which she would like to proceed.  In regards to the back, his symptoms have significantly improved with tramadol.  Have agreed to refill this.  He will follow-up with [place] as needed.  Follow-Up Instructions: No follow-ups on file.   Orders:  Orders Placed This Encounter  Procedures   XR Knee 1-2 Views Right   XR Lumbar Spine 2-3 Views   No orders of the defined types were placed in this encounter.     Procedures: Large Joint Inj: R knee on 10/20/2021 11:43 AM Indications: pain Details: 22 G needle, anterolateral approach Medications: 2 mL lidocaine 1 %; 2 mL bupivacaine 0.25 %; 40 mg methylPREDNISolone acetate 40 MG/ML     Clinical Data: No additional findings.   Subjective: Chief Complaint  Patient presents with   Right Leg - Pain   Lower Back - Pain    HPI patient is a pleasant 75 year old gentleman who comes in today with right knee pain and right lower back pain.  In regards to the right knee, he has had pain here for years.  The pain he has is to the lateral aspect.  He has associated swelling and locking.  Increased pain with standing, walking as well as at night when he is trying to sleep.  He has been taking tramadol without significant relief.  No previous cortisone injection.  In regards to the right lower back, the pain is occasionally radiating down  the back of the leg.  He does have associated paresthesias to right lower extremity.  He has recently been taking tramadol which does help.  Review of Systems as detailed in HPI.  All others reviewed and are negative.   Objective: Vital Signs: There were no vitals taken for this visit.  Physical Exam well-developed well-nourished gentleman in no acute distress.  Alert and oriented x3.  Ortho Exam right knee exam shows a trace effusion.  Range of motion 0 to 120 degrees.  Lateral joint line tenderness.  Valgus deformity.  Moderate patellofemoral crepitus.  Ligaments are stable.  He is neurovascular tact distally.  Lumbar spine exam shows no spinous tenderness he has mild right-sided paraspinous tenderness with positive straight leg raise.  No focal weakness.  He is neurovascular intact distally.  Specialty Comments:  No specialty comments available.  Imaging: XR Knee 1-2 Views Right  Result Date: 10/20/2021 X-rays demonstrate moderate tricompartmental degenerative changes  XR Lumbar Spine 2-3 Views  Result Date: 10/20/2021 X-rays demonstrate mild multilevel degenerative changes    PMFS History: Patient Active Problem List   Diagnosis Date Noted   Atherosclerosis of aorta (HCC) 06/16/2021   Hypertensive heart disease with chronic diastolic congestive heart failure (HCC) 06/16/2021   Alcoholic cirrhosis of liver without ascites (HCC) 06/16/2021  Calculus of gallbladder without cholecystitis without obstruction 06/16/2021   Chronic diastolic heart failure (HCC) 12/11/2020   Alcohol abuse with alcohol-induced mood disorder (HCC) 06/16/2019   PTSD (post-traumatic stress disorder) 06/16/2019   Thrombocytopenia (HCC) 02/24/2019   Chronic alcohol abuse 12/09/2015   Coronary artery disease involving coronary bypass graft of native heart without angina pectoris 08/07/2014   Essential hypertension 08/07/2014   Dyslipidemia 08/07/2014   Past Medical History:  Diagnosis Date   Anginal  pain (HCC) 01/2005   Anxiety    Arthritis    "knees"   Chronic lower back pain    Coronary artery disease    Depression    Hepatitis    "don't know what kind; they treated me for it; was a long time ago" (12/11/2018)   High cholesterol    History of bronchitis    "used to get it alot" (08/25/2012)   Hypertension    Panic attacks    PTSD (post-traumatic stress disorder)    "have been treated in the past" (08/25/2012)    Family History  Problem Relation Age of Onset   Diabetes Mother    Heart disease Father    Cancer Brother     Past Surgical History:  Procedure Laterality Date   CARDIAC CATHETERIZATION  01/2005   CORONARY ARTERY BYPASS GRAFT  01/2005   CABG X3   Shrapnel Left 1969   LLE; left lateral thumb (required grafting)   VARICOSE VEIN SURGERY  1980's   LLE   Social History   Occupational History   Not on file  Tobacco Use   Smoking status: Former    Packs/day: 2.00    Years: 5.00    Pack years: 10.00    Types: Cigarettes   Smokeless tobacco: Never   Tobacco comments:    08/25/2012 "quit smoking cigarettes 40 yr ago"  Vaping Use   Vaping Use: Never used  Substance and Sexual Activity   Alcohol use: Yes    Alcohol/week: 60.0 standard drinks    Types: 60 Standard drinks or equivalent per week    Comment: 12/11/2018 "~ 1 pint of vodka/day"   Drug use: Yes    Types: Marijuana    Comment: 08/25/2012 "last marijuana 10 years ago"   Sexual activity: Yes

## 2021-10-20 NOTE — Telephone Encounter (Signed)
Called and spoke with pt 10/19/21, he verb understanding.

## 2021-10-26 ENCOUNTER — Telehealth: Payer: Self-pay | Admitting: Orthopaedic Surgery

## 2021-10-26 DIAGNOSIS — G8929 Other chronic pain: Secondary | ICD-10-CM

## 2021-10-26 MED ORDER — PREDNISONE 10 MG (21) PO TBPK: ORAL_TABLET | ORAL | 3 refills | Status: DC

## 2021-10-26 NOTE — Telephone Encounter (Signed)
I called patient and advised. 

## 2021-10-26 NOTE — Telephone Encounter (Signed)
I called patient and advised. Referral entered for Lumbar ESI with Dr. Alvester Morin.  Patient would like to know if there is something stronger that you can give him for pain as the tramadol is not helping at all.  Please advise.

## 2021-10-26 NOTE — Telephone Encounter (Signed)
Pt called and states his back is killing him. He is wondering if he could get another injection?   CB [phone]

## 2021-10-26 NOTE — Telephone Encounter (Signed)
I sent prednisone

## 2021-10-26 NOTE — Telephone Encounter (Signed)
Please advise 

## 2021-10-26 NOTE — Telephone Encounter (Signed)
Sure he can make appt with [facility].  Thanks.

## 2021-11-02 ENCOUNTER — Telehealth: Payer: Self-pay | Admitting: Orthopaedic Surgery

## 2021-11-02 ENCOUNTER — Telehealth: Payer: Self-pay | Admitting: Physical Medicine and Rehabilitation

## 2021-11-02 NOTE — Telephone Encounter (Signed)
Pt called stating he's in a lot of pain and would like to see if he could be worked in sooner than 11/18/21 appt he currently has. He would like a CB.   [phone]

## 2021-11-02 NOTE — Telephone Encounter (Signed)
Pt called for the fourth time today.

## 2021-11-02 NOTE — Telephone Encounter (Signed)
Malva Cogan is off today. Adelina Mings can you help with this?

## 2021-11-02 NOTE — Telephone Encounter (Signed)
Pt states he needs a stronger pain medication then tramadol until he can be seen on 12/21 with [facility].   CB [phone]

## 2021-11-02 NOTE — Telephone Encounter (Signed)
Pt called stating he's in a lot of pain and would like a stronger rx sent in; he would like a CB to be updated on what's being sent in please.    [phone]

## 2021-11-02 NOTE — Telephone Encounter (Signed)
Called pt and advised that [name] will contact him asap and [name] is in surgery and would more than likely will call him tomorrow 12/05

## 2021-11-03 ENCOUNTER — Other Ambulatory Visit: Payer: Self-pay | Admitting: Physician Assistant

## 2021-11-03 MED ORDER — TRAMADOL HCL 50 MG PO TABS: 50.0000 mg | ORAL_TABLET | Freq: Four times a day (QID) | ORAL | 2 refills | Status: DC | PRN

## 2021-11-03 NOTE — Telephone Encounter (Signed)
The meds we have stronger than tramadol contain codeine which he is allergic to.  He can take two tramadol pills at once if he would like (100mg  total).  I can also send in a muscle relaxer.  Please let me konw

## 2021-11-03 NOTE — Telephone Encounter (Signed)
Sent in

## 2021-11-03 NOTE — Telephone Encounter (Signed)
Would like more Tramadol (2pills) 100mg  sent into pharm. Does not want any muscle relaxers.

## 2021-11-06 ENCOUNTER — Telehealth: Payer: Self-pay

## 2021-11-06 ENCOUNTER — Other Ambulatory Visit: Payer: Self-pay

## 2021-11-06 ENCOUNTER — Encounter: Payer: Self-pay | Admitting: Orthopaedic Surgery

## 2021-11-06 ENCOUNTER — Ambulatory Visit: Payer: Medicare Other | Admitting: Orthopaedic Surgery

## 2021-11-06 DIAGNOSIS — M1711 Unilateral primary osteoarthritis, right knee: Secondary | ICD-10-CM

## 2021-11-06 MED ORDER — METHYLPREDNISOLONE 4 MG PO TBPK: ORAL_TABLET | ORAL | 0 refills | Status: DC

## 2021-11-06 NOTE — Telephone Encounter (Signed)
Please submit right knee gel inj

## 2021-11-06 NOTE — Progress Notes (Signed)
Office Visit Note   Patient: [name]           Date of Birth: Jul 27, 1946           MRN: [id number] Visit Date: 11/06/2021              Requested by: Georgina Quint, MD [address] [place],  [place] [address] PCP: Georgina Quint, MD   Assessment & Plan: Visit Diagnoses:  1. Primary osteoarthritis of right knee     Plan: Natalie returns today for chronic right knee pain.  The cortisone injection that we did 2 weeks ago only gave him 1 day of relief.  He has lumbar spine ESI scheduled for the 21st with Dr. Alvester Morin.  He is here to discuss other treatment options for the knee pain.  Right knee exam is unchanged.  No joint effusion.  Jaymien understands that he has end-stage right knee DJD and he is looking at having a knee replacement in the summer time next year.  In the meantime he would like to get approval for Visco injection.  He also agreed to a steroid pack.  This patient is diagnosed with osteoarthritis of the knee(s).    Radiographs show evidence of joint space narrowing, osteophytes, subchondral sclerosis and/or subchondral cysts.  This patient has knee pain which interferes with functional and activities of daily living.    This patient has experienced inadequate response, adverse effects and/or intolerance with conservative treatments such as acetaminophen, NSAIDS, topical creams, physical therapy or regular exercise, knee bracing and/or weight loss.   This patient has experienced inadequate response or has a contraindication to intra articular steroid injections for at least 3 months.   This patient is not scheduled to have a total knee replacement within 6 months of starting treatment with viscosupplementation.  Follow-Up Instructions: No follow-ups on file.   Orders:  No orders of the defined types were placed in this encounter.  Meds ordered this encounter  Medications   methylPREDNISolone (MEDROL DOSEPAK) 4 MG TBPK tablet    Sig: Take as directed     Dispense:  21 tablet    Refill:  0      Procedures: No procedures performed   Clinical Data: No additional findings.   Subjective: Chief Complaint  Patient presents with   Right Knee - Pain    HPI  Review of Systems   Objective: Vital Signs: There were no vitals taken for this visit.  Physical Exam  Ortho Exam  Specialty Comments:  No specialty comments available.  Imaging: No results found.   PMFS History: Patient Active Problem List   Diagnosis Date Noted   Atherosclerosis of aorta (HCC) 06/16/2021   Hypertensive heart disease with chronic diastolic congestive heart failure (HCC) 06/16/2021   Alcoholic cirrhosis of liver without ascites (HCC) 06/16/2021   Calculus of gallbladder without cholecystitis without obstruction 06/16/2021   Chronic diastolic heart failure (HCC) 12/11/2020   Alcohol abuse with alcohol-induced mood disorder (HCC) 06/16/2019   PTSD (post-traumatic stress disorder) 06/16/2019   Thrombocytopenia (HCC) 02/24/2019   Chronic alcohol abuse 12/09/2015   Coronary artery disease involving coronary bypass graft of native heart without angina pectoris 08/07/2014   Essential hypertension 08/07/2014   Dyslipidemia 08/07/2014   Past Medical History:  Diagnosis Date   Anginal pain (HCC) 01/2005   Anxiety    Arthritis    "knees"   Chronic lower back pain    Coronary artery disease    Depression  Hepatitis    "don't know what kind; they treated me for it; was a long time ago" (12/11/2018)   High cholesterol    History of bronchitis    "used to get it alot" (08/25/2012)   Hypertension    Panic attacks    PTSD (post-traumatic stress disorder)    "have been treated in the past" (08/25/2012)    Family History  Problem Relation Age of Onset   Diabetes Mother    Heart disease Father    Cancer Brother     Past Surgical History:  Procedure Laterality Date   CARDIAC CATHETERIZATION  01/2005   CORONARY ARTERY BYPASS GRAFT  01/2005    CABG X3   Shrapnel Left 1969   LLE; left lateral thumb (required grafting)   VARICOSE VEIN SURGERY  1980's   LLE   Social History   Occupational History   Not on file  Tobacco Use   Smoking status: Former    Packs/day: 2.00    Years: 5.00    Pack years: 10.00    Types: Cigarettes   Smokeless tobacco: Never   Tobacco comments:    08/25/2012 "quit smoking cigarettes 40 yr ago"  Vaping Use   Vaping Use: Never used  Substance and Sexual Activity   Alcohol use: Yes    Alcohol/week: 60.0 standard drinks    Types: 60 Standard drinks or equivalent per week    Comment: 12/11/2018 "~ 1 pint of vodka/day"   Drug use: Yes    Types: Marijuana    Comment: 08/25/2012 "last marijuana 10 years ago"   Sexual activity: Yes

## 2021-11-06 NOTE — Telephone Encounter (Signed)
Pt called stating that he changed his mind and he would like something to be sent in for pain

## 2021-11-06 NOTE — Telephone Encounter (Signed)
Noted.  Will submit after 12/01/2021. 

## 2021-11-09 ENCOUNTER — Other Ambulatory Visit: Payer: Self-pay | Admitting: Physician Assistant

## 2021-11-09 MED ORDER — HYDROCODONE-ACETAMINOPHEN 5-325 MG PO TABS: 1.0000 | ORAL_TABLET | Freq: Two times a day (BID) | ORAL | 0 refills | Status: DC | PRN

## 2021-11-09 NOTE — Progress Notes (Signed)
Patient ID: [name]                 DOB: 25-Nov-1946                      MRN: [id number]     HPI: [name] is a 75 y.o. male referred by Dr. Katrinka Blazing to HTN clinic. PMH is significant for HTN, mvCAD with s/p CABG x3 with early graft failure and repeat surgery (2006), stable chest pain, chronic diastolic heart failure with LVH, HLD, and cirrhosis of the liver for which he has been referred to [facility]. Most recent echo showed EF 50-55% on 01/2014.  He was seen in the ER on 9/11 after reporting left chest pain with palpitations with SOB, diaphoresis. BP on presentation was 168/75. He refused medications from EMS. He shortly left the ED after being encouraged to stay. Patient was then seen on 9/14 by Dr. Katrinka Blazing for CAD follow-up. At that visit, he states he was not taking his losartan or furosemide and only taking metoprolol. Of note, his HR in office was 47, improving to 63 only after walking, so metoprolol and furosemide were discontinued and patient was started on losartan-HCTZ 50-12.5mg  daily.  Pt called clinic on 10/18 reporting dizziness, blurred vision, excessive thirst, and abnormal gait after taking losartan-HCTZ for the previous week and a half. He stated when not taking it, he felt fine. He also endorsed swelling in both legs, with persistent pain in the left leg. Venous doppler on 10/18  ruled out DVT. He was seen by me 10/20 and BP had improved notably to 127/60. Pt preferred to continue on same medication regiment despite above listed side effects since it was controlling his BP so well. However, when I called him with follow up lab results, he mentioned he thought he was having a gout flare and wondered if any of his meds could contribute. His HCTZ was stopped and he continued on just losartan. Did not want to increase his dose to 100mg  and preferred to continue at 50mg  daily first.  Today, patient reports that he went back to taking his losartan-HCTZ because his BP was higher on  just the losartan. Does not have a BP cuff to monitor his pressure at home though. Also states he didn't have a gout flare, was dealing with arthritis in his knees. Takes tramadol for this, steroid taper, and recently Norco rx.   He takes his losartan in the morning, and still feels mild dizziness, blurred vision and light-headedness when walking. Denies swelling. He states the last time he had these symptoms are when his BP was high. He mentions using sildenafil around three times a week. He does not check his BP at home. Still reports side effects with his BP med including thirst, dizziness, and blurred vision. Wishes to try a different BP medication. Doesn't recall specific side effect with amlodipine in the past but reports he didn't tolerate it well.  Current HTN meds: losartan-HCTZ 50-12.5mg  daily  Previously tried: metoprolol succinate 25 mg daily (bradycardia), amlodipine 10 mg daily, losartan 50 mg daily, hydralazine 25 m gq6h prn, lisinopril 20 mg daily, HCTZ 12.5mg  daily (gout)  BP goal: <130/40mmHg  Family History: family history includes Cancer in his brother; Diabetes in his mother; Heart disease in his father.  Social History:  reports that he has quit smoking. His smoking use included cigarettes. He has a 10.00 pack-year smoking history. He has never used smokeless tobacco. He reports  current alcohol use of about 60.0 standard drinks per week. He reports current drug use. Drug: Marijuana.  Diet: Has been eating lean protein like chicken and [place]. Eats oatmeal for breakfast, and eats lots of fruit that contain potassium. Does not drink a lot of coffee or soda; drinks mostly water  Home BP readings: n/a  Wt Readings from Last 3 Encounters:  08/12/21 231 lb 3.2 oz (104.9 kg)  08/09/21 253 lb 8.5 oz (115 kg)  06/16/21 228 lb (103.4 kg)   BP Readings from Last 3 Encounters:  09/17/21 127/60  08/12/21 (!) 150/80  08/09/21 (!) 159/74   Pulse Readings from Last 3 Encounters:   09/17/21 64  08/12/21 (!) 47  08/09/21 (!) 53    Renal function: CrCl cannot be calculated (Patient's most recent lab result is older than the maximum 21 days allowed.).  Past Medical History:  Diagnosis Date   Anginal pain (HCC) 01/2005   Anxiety    Arthritis    "knees"   Chronic lower back pain    Coronary artery disease    Depression    Hepatitis    "don't know what kind; they treated me for it; was a long time ago" (12/11/2018)   High cholesterol    History of bronchitis    "used to get it alot" (08/25/2012)   Hypertension    Panic attacks    PTSD (post-traumatic stress disorder)    "have been treated in the past" (08/25/2012)    Current Outpatient Medications on File Prior to Visit  Medication Sig Dispense Refill   benzonatate (TESSALON) 100 MG capsule Take 1 capsule (100 mg total) by mouth every 8 (eight) hours. 21 capsule 0   Evolocumab (REPATHA SURECLICK) 140 MG/ML SOAJ Inject 140 mg into the skin every 14 (fourteen) days. 2 mL 11   HYDROcodone-acetaminophen (NORCO) 5-325 MG tablet Take 1 tablet by mouth 2 (two) times daily as needed. 10 tablet 0   losartan (COZAAR) 50 MG tablet Take 1 tablet (50 mg total) by mouth daily. 30 tablet 5   methylPREDNISolone (MEDROL DOSEPAK) 4 MG TBPK tablet Take as directed 21 tablet 0   pantoprazole (PROTONIX) 20 MG tablet Take 1 tablet (20 mg total) by mouth 2 (two) times daily before a meal. 60 tablet 0   predniSONE (STERAPRED UNI-PAK 21 TAB) 10 MG (21) TBPK tablet Take as directed 21 tablet 3   sildenafil (VIAGRA) 100 MG tablet Take 100 mg by mouth daily as needed.     traMADol (ULTRAM) 50 MG tablet Take 1-2 tablets (50-100 mg total) by mouth every 6 (six) hours as needed. 60 tablet 2   No current facility-administered medications on file prior to visit.    Allergies  Allergen Reactions   Codeine Palpitations   Vicodin [Hydrocodone-Acetaminophen] Nausea And Vomiting, Swelling and Palpitations    There were no vitals taken for  this visit.   Assessment/Plan:  1. Hypertension - Patient's BP is slightly elevated above goal <130/26mmHg. Will stop losartan-HCTZ 50-12.5mg  daily secondary to side effects and start irbesartan 300mg  daily. Pt does not have BP cuff at home to check readings. Will f/u in 3 weeks in clinic for BP check and BMET.  Makyla Bye E. Jamieson Lisa, PharmD, BCACP, CPP [facility] [address] [place], [place] Phone: [phone]; Fax: [phone] 11/09/2021 3:00 PM

## 2021-11-09 NOTE — Telephone Encounter (Signed)
Spoke with patient and advised

## 2021-11-09 NOTE — Telephone Encounter (Signed)
Sent in small rx for norco

## 2021-11-10 ENCOUNTER — Ambulatory Visit (INDEPENDENT_AMBULATORY_CARE_PROVIDER_SITE_OTHER): Payer: Medicare Other | Admitting: Pharmacist

## 2021-11-10 ENCOUNTER — Other Ambulatory Visit: Payer: Self-pay

## 2021-11-10 ENCOUNTER — Ambulatory Visit: Payer: Medicare Other | Admitting: Orthopaedic Surgery

## 2021-11-10 VITALS — BP 138/80 | HR 62

## 2021-11-10 DIAGNOSIS — I10 Essential (primary) hypertension: Secondary | ICD-10-CM | POA: Diagnosis not present

## 2021-11-10 MED ORDER — IRBESARTAN 300 MG PO TABS: 300.0000 mg | ORAL_TABLET | Freq: Every day | ORAL | 5 refills | Status: DC

## 2021-11-10 NOTE — Patient Instructions (Addendum)
It was nice to see you today!  Your blood pressure goal is < 130/41mmHg  Stop taking losartan-HCTZ  Start taking irbesartan 300mg  - 1 tablet once daily  Follow up in 3 weeks for blood pressure check and lab work

## 2021-11-17 ENCOUNTER — Other Ambulatory Visit: Payer: Self-pay | Admitting: Physician Assistant

## 2021-11-17 ENCOUNTER — Telehealth: Payer: Self-pay | Admitting: Radiology

## 2021-11-17 MED ORDER — OXYCODONE HCL 5 MG PO TABS: 5.0000 mg | ORAL_TABLET | Freq: Two times a day (BID) | ORAL | 0 refills | Status: DC | PRN

## 2021-11-17 NOTE — Telephone Encounter (Signed)
Patient called triage line with questions about pain medication. He states that he is in excruciating pain and has not slept. He was given hydrocodone for about 5 days previously, which helped a lot. He asked if this would affect his liver as he has had some mild cirrhosis. He is requesting something for pain as he says that he cannot take this.  Patient uses [facility] on [address].  Please advise.  CB [phone]

## 2021-11-17 NOTE — Telephone Encounter (Signed)
Mychart Message sent to patient

## 2021-11-17 NOTE — Telephone Encounter (Signed)
Yes, there is tylenol in norco.  I can write one small rx for oxycodone (without tylenol), but if he needs more he will need to get from pcp or pain management (can make referral).  Unfortunately, we cannot write chronic narcotics

## 2021-11-18 ENCOUNTER — Ambulatory Visit (INDEPENDENT_AMBULATORY_CARE_PROVIDER_SITE_OTHER): Payer: Medicare Other | Admitting: Physical Medicine and Rehabilitation

## 2021-11-18 ENCOUNTER — Ambulatory Visit: Payer: Self-pay

## 2021-11-18 ENCOUNTER — Other Ambulatory Visit: Payer: Self-pay

## 2021-11-18 ENCOUNTER — Encounter: Payer: Self-pay | Admitting: Physical Medicine and Rehabilitation

## 2021-11-18 VITALS — BP 178/72 | HR 72

## 2021-11-18 DIAGNOSIS — M5416 Radiculopathy, lumbar region: Secondary | ICD-10-CM | POA: Diagnosis not present

## 2021-11-18 MED ORDER — METHYLPREDNISOLONE ACETATE 80 MG/ML IJ SUSP
80.0000 mg | Freq: Once | INTRAMUSCULAR | Status: AC
  Administered 2021-11-18: 14:00:00 80 mg

## 2021-11-18 NOTE — Patient Instructions (Signed)

## 2021-11-18 NOTE — Progress Notes (Signed)
Pt state lower back pain that travels down his right leg. Pt state walking, standing and sitting makes the pain worse. Pt sttae he takes pain meds tohelp ease his pain  Numeric Pain Rating Scale and Functional Assessment Average Pain 10   In the last MONTH (on 0-10 scale) has pain interfered with the following?  1. General activity like being  able to carry out your everyday physical activities such as walking, climbing stairs, carrying groceries, or moving a chair?  Rating(10)   +Driver, -BT, -Dye Allergies.

## 2021-11-19 ENCOUNTER — Other Ambulatory Visit: Payer: Self-pay

## 2021-11-26 ENCOUNTER — Telehealth: Payer: Self-pay | Admitting: Emergency Medicine

## 2021-11-26 NOTE — Telephone Encounter (Signed)
Patient calling in  Says he believes he needs to possibly be tested for diabetes  Says he has been extremely thirsty these past few days.. urine is smelling sweet.. & he is feeling dizzy & lightheaded  Offered 1st available appt & patient declined.. says he can not wait that long & wants to know what provider recommends urgently bc he is very concerned  Please call [phone]

## 2021-11-26 NOTE — Telephone Encounter (Signed)
Recommend to be seen at urgent care center today.  Thanks.

## 2021-11-26 NOTE — Telephone Encounter (Signed)
Pt expressed understand and agreed to go to Urgent care.

## 2021-11-27 ENCOUNTER — Encounter (HOSPITAL_COMMUNITY): Payer: Self-pay

## 2021-11-27 ENCOUNTER — Ambulatory Visit (HOSPITAL_COMMUNITY)
Admission: EM | Admit: 2021-11-27 | Discharge: 2021-11-27 | Disposition: A | Discharge status: Home / Self Care | Payer: Medicare Other | Attending: Emergency Medicine | Admitting: Emergency Medicine

## 2021-11-27 ENCOUNTER — Other Ambulatory Visit: Payer: Self-pay

## 2021-11-27 DIAGNOSIS — R7303 Prediabetes: Secondary | ICD-10-CM | POA: Diagnosis not present

## 2021-11-27 DIAGNOSIS — R42 Dizziness and giddiness: Secondary | ICD-10-CM | POA: Diagnosis not present

## 2021-11-27 DIAGNOSIS — I10 Essential (primary) hypertension: Secondary | ICD-10-CM | POA: Diagnosis not present

## 2021-11-27 DIAGNOSIS — Z87891 Personal history of nicotine dependence: Secondary | ICD-10-CM | POA: Diagnosis not present

## 2021-11-27 LAB — POCT URINALYSIS DIPSTICK, ED / UC
Bilirubin Urine: NEGATIVE
Glucose, UA: NEGATIVE mg/dL
Hgb urine dipstick: NEGATIVE
Ketones, ur: NEGATIVE mg/dL
Leukocytes,Ua: NEGATIVE
Nitrite: NEGATIVE
Protein, ur: NEGATIVE mg/dL
Specific Gravity, Urine: 1.025 (ref 1.005–1.030)
Urobilinogen, UA: 2 mg/dL — ABNORMAL HIGH (ref 0.0–1.0)
pH: 6 (ref 5.0–8.0)

## 2021-11-27 LAB — CBG MONITORING, ED: Glucose-Capillary: 85 mg/dL (ref 70–99)

## 2021-11-27 NOTE — Discharge Instructions (Addendum)
Your urinalysis today was essentially normal.  To be complete, I have requested a urine culture to be performed, this typically takes 3 to 5 days and the result will be posted to your MyChart account.  If the result is positive, you will be contacted by phone and antibiotics will be provided for treatment.  I noticed that your blood pressure today is very elevated, please be sure that you are taking your blood pressure medication daily and at the same time.  Please reach out to your primary care provider to let them know about your elevated blood pressure, particularly if you are taking your blood pressure medication exactly as prescribed.  I am glad to say that I am not concerned about you having an alarming elevation of your blood sugar.  Please keep your regular follow-up appointments with your primary care provider.  Thank you for visiting urgent care today.  I wish you a happy new year.

## 2021-11-27 NOTE — ED Triage Notes (Signed)
Pt presents with c/o high blood sugar. States he is pre diabetic. States he has an odor in his urine, dizziness and blurred vision.

## 2021-11-27 NOTE — ED Provider Notes (Signed)
[facility]    CSN: [id number] Arrival date & time: 11/27/21  1654    HISTORY   Chief Complaint  Patient presents with   Hyperglycemia   HPI [name] is a 75 y.o. male. Patient reports a history of prediabetes, states he been having some blurry vision and feeling dizzy recently, thinks he may have any symptoms secondary to eating more sugar around the holidays.  Patient's fingerstick blood sugar during visit today is 85, patient advised.  Patient states his urine is also had an unusual odor.  Patient states he is also been more thirsty and urinating more frequently.  Urine dip today showed elevated urobilinogen but was otherwise normal.  The history is provided by the patient.  Past Medical History:  Diagnosis Date   Anginal pain (HCC) 01/2005   Anxiety    Arthritis    "knees"   Chronic lower back pain    Coronary artery disease    Depression    Hepatitis    "don't know what kind; they treated me for it; was a long time ago" (12/11/2018)   High cholesterol    History of bronchitis    "used to get it alot" (08/25/2012)   Hypertension    Panic attacks    PTSD (post-traumatic stress disorder)    "have been treated in the past" (08/25/2012)   Patient Active Problem List   Diagnosis Date Noted   Atherosclerosis of aorta (HCC) 06/16/2021   Hypertensive heart disease with chronic diastolic congestive heart failure (HCC) 06/16/2021   Alcoholic cirrhosis of liver without ascites (HCC) 06/16/2021   Calculus of gallbladder without cholecystitis without obstruction 06/16/2021   Chronic diastolic heart failure (HCC) 12/11/2020   Alcohol abuse with alcohol-induced mood disorder (HCC) 06/16/2019   PTSD (post-traumatic stress disorder) 06/16/2019   Thrombocytopenia (HCC) 02/24/2019   Chronic alcohol abuse 12/09/2015   Coronary artery disease involving coronary bypass graft of native heart without angina pectoris 08/07/2014   Essential hypertension 08/07/2014    Dyslipidemia 08/07/2014   Past Surgical History:  Procedure Laterality Date   CARDIAC CATHETERIZATION  01/2005   CORONARY ARTERY BYPASS GRAFT  01/2005   CABG X3   Shrapnel Left 1969   LLE; left lateral thumb (required grafting)   VARICOSE VEIN SURGERY  1980's   LLE    Home Medications    Prior to Admission medications   Medication Sig Start Date End Date Taking? Authorizing Provider  benzonatate (TESSALON) 100 MG capsule Take 1 capsule (100 mg total) by mouth every 8 (eight) hours. 05/17/21   Walisiewicz, Kaitlyn E, PA-C  Evolocumab (REPATHA SURECLICK) 140 MG/ML SOAJ Inject 140 mg into the skin every 14 (fourteen) days. 03/23/21   Lyn Records, MD  HYDROcodone-acetaminophen (NORCO) 5-325 MG tablet Take 1 tablet by mouth 2 (two) times daily as needed. 11/09/21   Cristie Hem, PA-C  irbesartan (AVAPRO) 300 MG tablet Take 1 tablet (300 mg total) by mouth daily. 11/10/21   Lyn Records, MD  methylPREDNISolone (MEDROL DOSEPAK) 4 MG TBPK tablet Take as directed 11/06/21   Tarry Kos, MD  oxyCODONE (ROXICODONE) 5 MG immediate release tablet Take 1 tablet (5 mg total) by mouth 2 (two) times daily as needed. 11/17/21 11/17/22  Cristie Hem, PA-C  pantoprazole (PROTONIX) 20 MG tablet Take 1 tablet (20 mg total) by mouth 2 (two) times daily before a meal. 04/29/20   Fawze, Mina A, PA-C  predniSONE (STERAPRED UNI-PAK 21 TAB) 10 MG (21) TBPK tablet  Take as directed 10/26/21   Leandrew Koyanagi, MD  sildenafil (VIAGRA) 100 MG tablet Take 100 mg by mouth daily as needed. 06/17/21   [provider]  traMADol (ULTRAM) 50 MG tablet Take 1-2 tablets (50-100 mg total) by mouth every 6 (six) hours as needed. 11/03/21   Aundra Dubin, PA-C    Family History Family History  Problem Relation Age of Onset   Diabetes Mother    Heart disease Father    Cancer Brother    Social History Social History   Tobacco Use   Smoking status: Former    Packs/day: 2.00    Years: 5.00    Pack years:  10.00    Types: Cigarettes   Smokeless tobacco: Never   Tobacco comments:    08/25/2012 "quit smoking cigarettes 40 yr ago"  Vaping Use   Vaping Use: Never used  Substance Use Topics   Alcohol use: Yes    Alcohol/week: 60.0 standard drinks    Types: 60 Standard drinks or equivalent per week    Comment: 12/11/2018 "~ 1 pint of vodka/day"   Drug use: Yes    Types: Marijuana    Comment: 08/25/2012 "last marijuana 10 years ago"   Allergies   Codeine and Vicodin [hydrocodone-acetaminophen]  Review of Systems Review of Systems Pertinent findings noted in history of present illness.   Physical Exam Triage Vital Signs ED Triage Vitals  Enc Vitals Group     BP 09/25/21 0827 (!) 147/82     Pulse Rate 09/25/21 0827 72     Resp 09/25/21 0827 18     Temp 09/25/21 0827 98.3 F (36.8 C)     Temp Source 09/25/21 0827 Oral     SpO2 09/25/21 0827 98 %     Weight --      Height --      Head Circumference --      Peak Flow --      Pain Score 09/25/21 0826 5     Pain Loc --      Pain Edu? --      Excl. in [place]? --   No data found.  Updated Vital Signs BP (!) 178/82 (BP Location: Right Arm)    Pulse (!) 57    Temp 98.3 F (36.8 C) (Oral)    Resp 19    SpO2 98%   Physical Exam Vitals and nursing note reviewed.  Constitutional:      General: He is not in acute distress.    Appearance: Normal appearance. He is not ill-appearing.  HENT:     Head: Normocephalic and atraumatic.  Eyes:     General: Lids are normal.        Right eye: No discharge.        Left eye: No discharge.     Extraocular Movements: Extraocular movements intact.     Conjunctiva/sclera: Conjunctivae normal.     Right eye: Right conjunctiva is not injected.     Left eye: Left conjunctiva is not injected.     Pupils: Pupils are equal, round, and reactive to light.  Neck:     Trachea: Trachea and phonation normal.  Cardiovascular:     Rate and Rhythm: Normal rate and regular rhythm.     Pulses: Normal pulses.      Heart sounds: Normal heart sounds. No murmur heard.   No friction rub. No gallop.  Pulmonary:     Effort: Pulmonary effort is normal. No accessory muscle usage, prolonged expiration  or respiratory distress.     Breath sounds: Normal breath sounds. No stridor, decreased air movement or transmitted upper airway sounds. No decreased breath sounds, wheezing, rhonchi or rales.  Chest:     Chest wall: No tenderness.  Musculoskeletal:        General: Normal range of motion.     Cervical back: Normal range of motion and neck supple. Normal range of motion.  Lymphadenopathy:     Cervical: No cervical adenopathy.  Skin:    General: Skin is warm and dry.     Findings: No erythema or rash.  Neurological:     General: No focal deficit present.     Mental Status: He is alert and oriented to person, place, and time.  Psychiatric:        Mood and Affect: Mood normal.        Behavior: Behavior normal.    Visual Acuity Right Eye Distance:   Left Eye Distance:   Bilateral Distance:    Right Eye Near:   Left Eye Near:    Bilateral Near:     UC Couse / Diagnostics / Procedures:    EKG  Radiology No results found.  Procedures Procedures (including critical care time)  UC Diagnoses / Final Clinical Impressions(s)   I have reviewed the triage vital signs and the nursing notes.  Pertinent labs & imaging results that were available during my care of the patient were reviewed by me and considered in my medical decision making (see chart for details).    Final diagnoses:  Dizziness  Essential hypertension   Physical exam today is unremarkable.  Blood sugar is normal.  Blood pressure is elevated.  Patient advised to follow-up with primary care provider and reminded to take all blood pressure medications exactly as prescribed.  Urine dip revealed increased urobilinogen, will order urine culture to be complete.  Patient reminded that with prediabetes, he should be avoiding sweets.  ED  Prescriptions   None    PDMP not reviewed this encounter.  Pending results:  Labs Reviewed  POCT URINALYSIS DIPSTICK, ED / UC - Abnormal; Notable for the following components:      Result Value   Urobilinogen, UA 2.0 (*)    All other components within normal limits  URINE CULTURE  CBG MONITORING, ED    Medications Ordered in UC: Medications - No data to display  Disposition Upon Discharge:  Condition: stable for discharge home Home: take medications as prescribed; routine discharge instructions as discussed; follow up as advised.  Patient presented with an acute illness with associated systemic symptoms and significant discomfort requiring urgent management. In my opinion, this is a condition that a prudent lay person (someone who possesses an average knowledge of health and medicine) may potentially expect to result in complications if not addressed urgently such as respiratory distress, impairment of bodily function or dysfunction of bodily organs.   Routine symptom specific, illness specific and/or disease specific instructions were discussed with the patient and/or caregiver at length.   As such, the patient has been evaluated and assessed, work-up was performed and treatment was provided in alignment with urgent care protocols and evidence based medicine.  Patient/parent/caregiver has been advised that the patient may require follow up for further testing and treatment if the symptoms continue in spite of treatment, as clinically indicated and appropriate.  If the patient was tested for COVID-19, Influenza and/or RSV, then the patient/parent/guardian was advised to isolate at home pending the results of his/her diagnostic  coronavirus test and potentially longer if theyre positive. I have also advised pt that if his/her COVID-19 test returns positive, it's recommended to self-isolate for at least 10 days after symptoms first appeared AND until fever-free for 24 hours without fever  reducer AND other symptoms have improved or resolved. Discussed self-isolation recommendations as well as instructions for household member/close contacts as per the [facility] and [place] [facility], and also gave patient the [place] packet with this information.  Patient/parent/caregiver has been advised to return to the [facility] or PCP in 3-5 days if no better; to PCP or the Emergency Department if new signs and symptoms develop, or if the current signs or symptoms continue to change or worsen for further workup, evaluation and treatment as clinically indicated and appropriate  The patient will follow up with their current PCP if and as advised. If the patient does not currently have a PCP we will assist them in obtaining one.   The patient may need specialty follow up if the symptoms continue, in spite of conservative treatment and management, for further workup, evaluation, consultation and treatment as clinically indicated and appropriate.   Patient/parent/caregiver verbalized understanding and agreement of plan as discussed.  All questions were addressed during visit.  Please see discharge instructions below for further details of plan.  Discharge Instructions:   Discharge Instructions      Your urinalysis today was essentially normal.  To be complete, I have requested a urine culture to be performed, this typically takes 3 to 5 days and the result will be posted to your MyChart account.  If the result is positive, you will be contacted by phone and antibiotics will be provided for treatment.  I noticed that your blood pressure today is very elevated, please be sure that you are taking your blood pressure medication daily and at the same time.  Please reach out to your primary care provider to let them know about your elevated blood pressure, particularly if you are taking your blood pressure medication exactly as prescribed.  I am glad to say that I am not concerned about you having an alarming elevation of your  blood sugar.  Please keep your regular follow-up appointments with your primary care provider.  Thank you for visiting urgent care today.  I wish you a happy new year.      This office note has been dictated using Museum/gallery curator.  Unfortunately, and despite my best efforts, this method of dictation can sometimes lead to occasional typographical or grammatical errors.  I apologize in advance if this occurs.     Lynden Oxford Scales, [place] 11/27/21 [phone]

## 2021-11-28 ENCOUNTER — Telehealth (HOSPITAL_COMMUNITY): Payer: Self-pay | Admitting: Emergency Medicine

## 2021-11-28 ENCOUNTER — Other Ambulatory Visit: Payer: Self-pay | Admitting: Emergency Medicine

## 2021-11-28 DIAGNOSIS — I2510 Atherosclerotic heart disease of native coronary artery without angina pectoris: Secondary | ICD-10-CM

## 2021-11-28 NOTE — Telephone Encounter (Signed)
Patient called after seeing in my chart the "urobili" red flagged.  Requested Ignacia Bayley , PA review chart .   This nurse called patient, verifying identity with 2 identifiers.  Explained to patient that providers are waiting for urine culture to return.  In light of cirrhosis diagnosis, urobili presence is not uncommon.    Patient also spoke to the concern that he can smell odors through skin.  Discussed fragrant, pugant vegetables and spices may be detected this way.  Patient had no further questions

## 2021-11-29 LAB — URINE CULTURE: Culture: NO GROWTH

## 2021-12-01 ENCOUNTER — Telehealth: Payer: Self-pay | Admitting: Emergency Medicine

## 2021-12-01 NOTE — Telephone Encounter (Signed)
Connected to [facility] 12.31.2022.  Caller says that the NP told him to go to the UC the other day. He went yesterday; they said there is something to do with his urine that has to be treated with antibiotics. He got the results and he wants somebody contacted to call the antibiotics. States he has cirrhosis of the liver.

## 2021-12-01 NOTE — Telephone Encounter (Signed)
Called and spoke with pt, scheduled pt OV

## 2021-12-02 DIAGNOSIS — H40023 Open angle with borderline findings, high risk, bilateral: Secondary | ICD-10-CM | POA: Diagnosis not present

## 2021-12-02 DIAGNOSIS — H524 Presbyopia: Secondary | ICD-10-CM | POA: Diagnosis not present

## 2021-12-02 DIAGNOSIS — H52223 Regular astigmatism, bilateral: Secondary | ICD-10-CM | POA: Diagnosis not present

## 2021-12-02 DIAGNOSIS — H25813 Combined forms of age-related cataract, bilateral: Secondary | ICD-10-CM | POA: Diagnosis not present

## 2021-12-02 DIAGNOSIS — H3561 Retinal hemorrhage, right eye: Secondary | ICD-10-CM | POA: Diagnosis not present

## 2021-12-03 ENCOUNTER — Other Ambulatory Visit: Payer: Self-pay

## 2021-12-03 ENCOUNTER — Telehealth: Payer: Self-pay | Admitting: Interventional Cardiology

## 2021-12-03 ENCOUNTER — Telehealth: Payer: Self-pay

## 2021-12-03 ENCOUNTER — Ambulatory Visit (INDEPENDENT_AMBULATORY_CARE_PROVIDER_SITE_OTHER): Payer: Medicare Other | Admitting: Pharmacist

## 2021-12-03 VITALS — BP 142/82 | HR 54 | Wt 231.0 lb

## 2021-12-03 DIAGNOSIS — I10 Essential (primary) hypertension: Secondary | ICD-10-CM

## 2021-12-03 DIAGNOSIS — E7849 Other hyperlipidemia: Secondary | ICD-10-CM

## 2021-12-03 MED ORDER — SPIRONOLACTONE 25 MG PO TABS: 25.0000 mg | ORAL_TABLET | Freq: Every day | ORAL | 5 refills | Status: DC

## 2021-12-03 NOTE — Telephone Encounter (Signed)
VOB submitted for Duroalne, right knee. BV pending.

## 2021-12-03 NOTE — Progress Notes (Signed)
Patient ID: [name]                 DOB: [birth year]                      MRN: [id number]     HPI: [name] is a 76 y.o. male referred by Dr. Katrinka Blazing to HTN clinic. PMH is significant for HTN, mvCAD with s/p CABG x3 with early graft failure and repeat surgery (2006), stable chest pain, chronic diastolic heart failure with LVH, HLD, and cirrhosis of the liver for which he has been referred to [facility], prediabetic. Most recent echo showed EF 50-55% on 01/2014.  He was seen in the ER on 9/11 after reporting left chest pain with palpitations with SOB, diaphoresis. BP on presentation was 168/75. He refused medications from EMS. He shortly left the ED after being encouraged to stay. Patient was then seen on 9/14 by Dr. Katrinka Blazing for CAD follow-up. At that visit, he states he was not taking his losartan or furosemide and only taking metoprolol. Of note, his HR in office was 47, improving to 63 only after walking, so metoprolol and furosemide were discontinued and patient was started on losartan-HCTZ 50-12.5mg  daily.  Pt called clinic on 10/18 reporting dizziness, blurred vision, excessive thirst, and abnormal gait after taking losartan-HCTZ for the previous week and a half. He stated when not taking it, he felt fine. He also endorsed swelling in both legs, with persistent pain in the left leg. Venous doppler on 10/18 ruled out DVT. He was seen by me 10/20 and BP had improved notably to 127/60. Pt preferred to continue on same medication regiment despite above listed side effects since it was controlling his BP so well. However, when I called him with follow up lab results, he mentioned he thought he was having a gout flare and wondered if any of his meds could contribute. His HCTZ was stopped and he continued on just losartan. Did not want to increase his dose to 100mg  and preferred to continue at 50mg  daily first.  At last visit on 12/13, patient reports that he went back to taking his losartan-HCTZ  because his BP was higher on just the losartan. Does not have a BP cuff to monitor his pressure at home though. Also states he didn't have a gout flare, was dealing with arthritis in his knees. He mentions using sildenafil around three times a week. Doesn't recall specific side effect with amlodipine in the past but reports he didn't tolerate it well. Again discontinued losartan-HCTZ at this visit secondary to side effects. Initiated irbesartan 300 mg once daily.  Pt presented to ED on 11/27/21 with CC of blurry vision, dizziness. BG and urinalysis normal, urobilinogen elevated to 2.0. Elevated BP 178/82 on admission. Advised to continue at home blood pressure medications and avoid sweets.  Today, patient presents reporting multiple retinal hemorrhages in right eye noted at recent eye doctor apt. Pt reported that MD recommended a carotid doppler, notes faxed to our office. An appt was set with Dr. 1/14 for follow up on 1/10.  At today's visit, pt reports adherence to irbesartan 300 mg, but has missed this AM dose. Pt again reports blurry vision, dizziness, swelling in his legs (none noted today), and lethargy where he would fall asleep frequently when sitting down. States these feel similar to side effects experienced prior to CABG but also attributes some of these issues to his irbesartan. Pt prefers to lower or  discontinue irbesartan today. Pt endorses adherence to Repatha and requested a lipid panel to assess LDL levels today.   Current HTN meds: irbesartan 300 mg daily  Previously tried: metoprolol succinate 25 mg daily (bradycardia), amlodipine 10 mg daily, losartan 50 mg daily, hydralazine 25 m gq6h prn, lisinopril 20 mg daily, HCTZ 12.5mg  daily (gout), losartan-HCT 50-12.5mg  daily, irbesartan (blurred vision, dizziness, lethargy)  BP goal: <130/3880mmHg  Family History: family history includes Cancer in his brother; Diabetes in his mother; Heart disease in his father.  Social History:  reports  that he has quit smoking. His smoking use included cigarettes. He has a 10.00 pack-year smoking history. He has never used smokeless tobacco. He reports current alcohol use of about 60.0 standard drinks per week. He reports current drug use. Drug: Marijuana.  Diet: Has been eating lean protein like chicken and [place]. Eats oatmeal for breakfast, and eats lots of fruit that contain potassium. Does not drink a lot of coffee or soda; drinks mostly water  Home BP readings: n/a  Wt Readings from Last 3 Encounters:  08/12/21 231 lb 3.2 oz (104.9 kg)  08/09/21 253 lb 8.5 oz (115 kg)  06/16/21 228 lb (103.4 kg)   BP Readings from Last 3 Encounters:  11/27/21 (!) 178/82  11/18/21 (!) 178/72  11/10/21 138/80   Pulse Readings from Last 3 Encounters:  11/27/21 (!) 57  11/18/21 72  11/10/21 62    Renal function: CrCl cannot be calculated (Patient's most recent lab result is older than the maximum 21 days allowed.).  Past Medical History:  Diagnosis Date   Anginal pain (HCC) 01/2005   Anxiety    Arthritis    "knees"   Chronic lower back pain    Coronary artery disease    Depression    Hepatitis    "don't know what kind; they treated me for it; was a long time ago" (12/11/2018)   High cholesterol    History of bronchitis    "used to get it alot" (08/25/2012)   Hypertension    Panic attacks    PTSD (post-traumatic stress disorder)    "have been treated in the past" (08/25/2012)    Current Outpatient Medications on File Prior to Visit  Medication Sig Dispense Refill   benzonatate (TESSALON) 100 MG capsule Take 1 capsule (100 mg total) by mouth every 8 (eight) hours. 21 capsule 0   Evolocumab (REPATHA SURECLICK) 140 MG/ML SOAJ Inject 140 mg into the skin every 14 (fourteen) days. 2 mL 11   HYDROcodone-acetaminophen (NORCO) 5-325 MG tablet Take 1 tablet by mouth 2 (two) times daily as needed. 10 tablet 0   irbesartan (AVAPRO) 300 MG tablet Take 1 tablet (300 mg total) by mouth daily. 30  tablet 5   methylPREDNISolone (MEDROL DOSEPAK) 4 MG TBPK tablet Take as directed 21 tablet 0   oxyCODONE (ROXICODONE) 5 MG immediate release tablet Take 1 tablet (5 mg total) by mouth 2 (two) times daily as needed. 10 tablet 0   pantoprazole (PROTONIX) 20 MG tablet Take 1 tablet (20 mg total) by mouth 2 (two) times daily before a meal. 60 tablet 0   predniSONE (STERAPRED UNI-PAK 21 TAB) 10 MG (21) TBPK tablet Take as directed 21 tablet 3   sildenafil (VIAGRA) 100 MG tablet Take 100 mg by mouth daily as needed.     traMADol (ULTRAM) 50 MG tablet Take 1-2 tablets (50-100 mg total) by mouth every 6 (six) hours as needed. 60 tablet 2   No current facility-administered  medications on file prior to visit.    Allergies  Allergen Reactions   Codeine Palpitations   Vicodin [Hydrocodone-Acetaminophen] Nausea And Vomiting, Swelling and Palpitations    There were no vitals taken for this visit.   Assessment/Plan:  1) HTN: Patient's BP is slightly elevated above goal <130/64mmHg. Stop irbesartan 300 mg daily secondary to blurry vision, dizziness, and lethargy that pt attributes to irbesartan (see above for details). Will initiate spironolactone 25 mg daily. Pt sees Dr Katrinka Blazing next week for follow up visit and BP will be reassessed then.  2) HLD: LDL most recently 42 at goal < 70 given hx of ASCVD. Continue Repatha 140 mg q14d. F/u lipid panel today to confirm lipids remain at goal per pt request.  [name], PharmD Candidate assisted in this visit.  Megan E. Supple, PharmD, BCACP, CPP [facility] [address], [place], [place] [address] Phone: [phone]; Fax: [phone] 12/03/2021 10:21 AM

## 2021-12-03 NOTE — Patient Instructions (Addendum)
It was nice to see you today!  Your blood pressure goal is < 130/48mmHg  Stop taking irbesartan  Start taking spironolactone - 1 tablet daily in the morning  Follow up with Dr Katrinka Blazing on Monday

## 2021-12-03 NOTE — Telephone Encounter (Signed)
Pt seen by his eye doctor and they noted multiple retinal hemorrhages in mid-periphery in right eye.  They are recommending pt have a carotid doppler performed to rule out any kind of blockage.  Will route to Dr. Katrinka Blazing for review.  Pt in today to see Pharmacy team for HTN clinic.  He mentioned increased fatigue and generally feeling bad like he did prior to his CABG.  Scheduled pt to see Dr. Katrinka Blazing on Monday, January 9th for eval.

## 2021-12-04 LAB — LIPID PANEL
Chol/HDL Ratio: 2.9 ratio (ref 0.0–5.0)
Cholesterol, Total: 128 mg/dL (ref 100–199)
HDL: 44 mg/dL (ref >39)
LDL Chol Calc (NIH): 55 mg/dL (ref 0–99)
Triglycerides: 174 mg/dL — ABNORMAL HIGH (ref 0–149)
VLDL Cholesterol Cal: 29 mg/dL (ref 5–40)

## 2021-12-04 LAB — COMPREHENSIVE METABOLIC PANEL
ALT: 26 IU/L (ref 0–44)
AST: 20 IU/L (ref 0–40)
Albumin/Globulin Ratio: 1.1 — ABNORMAL LOW (ref 1.2–2.2)
Albumin: 4.3 g/dL (ref 3.7–4.7)
Alkaline Phosphatase: 79 IU/L (ref 44–121)
BUN/Creatinine Ratio: 21 (ref 10–24)
BUN: 23 mg/dL (ref 8–27)
Bilirubin Total: 0.5 mg/dL (ref 0.0–1.2)
CO2: 21 mmol/L (ref 20–29)
Calcium: 9 mg/dL (ref 8.6–10.2)
Chloride: 103 mmol/L (ref 96–106)
Creatinine, Ser: 1.11 mg/dL (ref 0.76–1.27)
Globulin, Total: 3.9 g/dL (ref 1.5–4.5)
Glucose: 91 mg/dL (ref 70–99)
Potassium: 5 mmol/L (ref 3.5–5.2)
Sodium: 136 mmol/L (ref 134–144)
Total Protein: 8.2 g/dL (ref 6.0–8.5)
eGFR: 70 mL/min/{1.73_m2} (ref >59)

## 2021-12-04 LAB — LDL CHOLESTEROL, DIRECT: LDL Direct: 47 mg/dL (ref 0–99)

## 2021-12-04 NOTE — Telephone Encounter (Signed)
Bilateral carotid Doppler should be ordered.  ----- Message -----  From: Loren Racer, RN  Sent: 12/03/2021   1:45 PM EST  To: Belva Crome, MD, Loren Racer, RN    Pt coming to see Dr. Tamala Julian on Monday.  Will order at that time.

## 2021-12-06 NOTE — Progress Notes (Addendum)
Cardiology Office Note:    Date:  12/07/2021   ID:  Ronald SciaraJoseph M Mcauley, DOB [birth year], MRN [id number]  PCP:  Georgina QuintSagardia, [name], MD  Cardiologist:  Lesleigh NoeHenry W Caiya Bettes III, MD   Referring MD: Georgina QuintSagardia, [name], *   Chief Complaint  Patient presents with   Hypertension   Coronary Artery Disease   Congestive Heart Failure    History of Present Illness:    [name] is a 76 y.o. male with a hx of CAD s/p CABG x3, with early graft failure and re-do surgery (2006), HTN, HLD, PTSD, obesity with stable chest pain.   [facility]    Seen by ophthalmology for the evaluation of blurry vision identified retinal hemorrhage in right eye.  This was suggests blood pressure related issues and or diabetes.  Rule out embolic disease with ischemia and bleeding.  Does not sleep well.  Falls asleep a lot during the day.  Not having angina.  Past Medical History:  Diagnosis Date   Anginal pain (HCC) 01/2005   Anxiety    Arthritis    "knees"   Chronic lower back pain    Coronary artery disease    Depression    Hepatitis    "don't know what kind; they treated me for it; was a long time ago" (12/11/2018)   High cholesterol    History of bronchitis    "used to get it alot" (08/25/2012)   Hypertension    Panic attacks    PTSD (post-traumatic stress disorder)    "have been treated in the past" (08/25/2012)    Past Surgical History:  Procedure Laterality Date   CARDIAC CATHETERIZATION  01/2005   CORONARY ARTERY BYPASS GRAFT  01/2005   CABG X3   Shrapnel Left 1969   LLE; left lateral thumb (required grafting)   VARICOSE VEIN SURGERY  1980's   LLE    Current Medications: Current Meds  Medication Sig   escitalopram (LEXAPRO) 5 MG tablet Take 5 mg by mouth daily.   Evolocumab (REPATHA SURECLICK) 140 MG/ML SOAJ Inject 140 mg into the skin every 14 (fourteen) days.   indapamide (LOZOL) 1.25 MG tablet Take 1 tablet (1.25 mg total) by mouth daily.   sildenafil  (VIAGRA) 100 MG tablet Take 100 mg by mouth daily as needed.   spironolactone (ALDACTONE) 25 MG tablet Take 1 tablet (25 mg total) by mouth daily.   traMADol (ULTRAM) 50 MG tablet Take 1-2 tablets (50-100 mg total) by mouth every 6 (six) hours as needed.     Allergies:   Codeine and Vicodin [hydrocodone-acetaminophen]   Social History   Socioeconomic History   Marital status: Divorced    Spouse name: Not on file   Number of children: Not on file   Years of education: Not on file   Highest education level: Not on file  Occupational History   Not on file  Tobacco Use   Smoking status: Former    Packs/day: 2.00    Years: 5.00    Pack years: 10.00    Types: Cigarettes   Smokeless tobacco: Never   Tobacco comments:    08/25/2012 "quit smoking cigarettes 40 yr ago"  Vaping Use   Vaping Use: Never used  Substance and Sexual Activity   Alcohol use: Yes    Alcohol/week: 60.0 standard drinks    Types: 60 Standard drinks or equivalent per week    Comment: 12/11/2018 "~ 1 pint of vodka/day"   Drug use:  Yes    Types: Marijuana    Comment: 08/25/2012 "last marijuana 10 years ago"   Sexual activity: Yes  Other Topics Concern   Not on file  Social History Narrative   Not on file   Social Determinants of Health   Financial Resource Strain: Not on file  Food Insecurity: Not on file  Transportation Needs: Not on file  Physical Activity: Not on file  Stress: Not on file  Social Connections: Not on file     Family History: The patient's family history includes Cancer in his brother; Diabetes in his mother; Heart disease in his father.  ROS:   Please see the history of present illness.    Reads about medication side effects which then causes poor compliance.  All other systems reviewed and are negative.  EKGs/Labs/Other Studies Reviewed:    The following studies were reviewed today: No recent imaging  EKG:  EKG sinus rhythm at 61 bpm with first-degree AV block.  Precordial V1  and V2 T wave abnormality.  Recent Labs: 08/09/2021: B Natriuretic Peptide 86.8; Hemoglobin 14.0; Platelets 104 12/03/2021: ALT 26; BUN 23; Creatinine, Ser 1.11; Potassium 5.0; Sodium 136  Recent Lipid Panel    Component Value Date/Time   CHOL 128 12/03/2021 1407   TRIG 174 (H) 12/03/2021 1407   HDL 44 12/03/2021 1407   CHOLHDL 2.9 12/03/2021 1407   CHOLHDL 3 06/16/2021 1133   VLDL 38.6 06/16/2021 1133   LDLCALC 55 12/03/2021 1407   LDLDIRECT 47 12/03/2021 1407    Physical Exam:    VS:  BP 140/62    Pulse 61    Ht 5\' 10"  (1.778 m)    Wt 236 lb 3.2 oz (107.1 kg)    SpO2 96%    BMI 33.89 kg/m     Wt Readings from Last 3 Encounters:  12/07/21 236 lb 3.2 oz (107.1 kg)  12/03/21 231 lb (104.8 kg)  08/12/21 231 lb 3.2 oz (104.9 kg)     GEN: Obese. No acute distress HEENT: Normal NECK: No JVD. LYMPHATICS: No lymphadenopathy CARDIAC: No murmur. RRR S4 gallop, or edema. VASCULAR:  Normal Pulses. No bruits. RESPIRATORY:  Clear to auscultation without rales, wheezing or rhonchi  ABDOMEN: Soft, non-tender, non-distended, No pulsatile mass, MUSCULOSKELETAL: No deformity  SKIN: Warm and dry NEUROLOGIC:  Alert and oriented x 3 PSYCHIATRIC:  Normal affect   ASSESSMENT:    1. Coronary artery disease involving coronary bypass graft of native heart without angina pectoris   2. Chronic diastolic heart failure (HCC)   3. Other hyperlipidemia   4. Essential hypertension   5. Pre-diabetes   6. Chronic alcohol abuse   7. Retinal hemorrhage, unspecified laterality    PLAN:    In order of problems listed above:  Secondary prevention reviewed Continue current therapy Continue Repatha Add Lozol 1.2 mg/day to irbesartan 300 mg/day.  Continue to monitor blood pressure at home.  Irbesartan was discontinued the patient has continued to take it and has not started spironolactone as was previously recommended.  Potassium is 5.0 when evaluated most recently in early January.  He would not be  able to tolerate both spironolactone and irbesartan. Decrease carbohydrate intake Encouraged to discontinue The right eye was affected.  Suggests poorly controlled blood pressure.  Lozol is being added to irbesartan.  Bilateral carotid Doppler and echocardiogram to look for sources of embolization.   Needs aggressive risk factor modification, weight loss, and monitor laboratory data for evidence of renal insufficiency are hyperkalemia.  Continue  monitoring blood pressure at home.    Overall education and awareness concerning secondary risk prevention was discussed in detail: LDL less than 70, hemoglobin A1c less than 7, blood pressure target less than 130/80 mmHg, >150 minutes of moderate aerobic activity per week, avoidance of smoking, weight control (via diet and exercise), and continued surveillance/management of/for obstructive sleep apnea.    Medication Adjustments/Labs and Tests Ordered: Current medicines are reviewed at length with the patient today.  Concerns regarding medicines are outlined above.  Orders Placed This Encounter  Procedures   EKG 12-Lead   ECHOCARDIOGRAM COMPLETE   VAS US CAROTID   Meds ordered this encounter  Medications   indapamide (LOZOL) 1.25 MG tablet    Sig: Take 1 tablet (1.25 mg total) by mouth daily.    Dispense:  90 tablet    Refill:  3    D/c Spironolactone    Patient Instructions  Medication Instructions:  1) Stay on Irbesartan.  Do not start Spironolactone. 2) START Lovol (Indapamide) 1.25mg  once daily  *If you need a refill on your cardiac medications before your next appointment, please call your pharmacy*   Lab Work: None  If you have labs (blood work) drawn today and your tests are completely normal, you will receive your results only by: MyChart Message (if you have MyChart) OR A paper copy in the mail If you have any lab test that is abnormal or we need to change your treatment, we will call you to review the  results.   Testing/Procedures: Your physician has requested that you have a carotid duplex. This test is an ultrasound of the carotid arteries in your neck. It looks at blood flow through these arteries that supply the brain with blood. Allow one hour for this exam. There are no restrictions or special instructions.  Your physician has requested that you have an echocardiogram. Echocardiography is a painless test that uses sound waves to create images of your heart. It provides your doctor with information about the size and shape of your heart and how well your hearts chambers and valves are working. This procedure takes approximately one hour. There are no restrictions for this procedure.  Follow-Up: At [facility], you and your health needs are our priority.  As part of our continuing mission to provide you with exceptional heart care, we have created designated Provider Care Teams.  These Care Teams include your primary Cardiologist (physician) and Advanced Practice Providers (APPs -  Physician Assistants and Nurse Practitioners) who all work together to provide you with the care you need, when you need it.  We recommend signing up for the patient portal called "MyChart".  Sign up information is provided on this After Visit Summary.  MyChart is used to connect with patients for Virtual Visits (Telemedicine).  Patients are able to view lab/test results, encounter notes, upcoming appointments, etc.  Non-urgent messages can be sent to your provider as well.   To learn more about what you can do with MyChart, go to [email or web address].    Your next appointment:   6 month(s)  The format for your next appointment:   In Person  Provider:   Lesleigh Noe, MD     Other Instructions     Signed, Lesleigh Noe, MD  12/07/2021 12:43 PM    [facility]

## 2021-12-07 ENCOUNTER — Encounter: Payer: Self-pay | Admitting: Interventional Cardiology

## 2021-12-07 ENCOUNTER — Ambulatory Visit (INDEPENDENT_AMBULATORY_CARE_PROVIDER_SITE_OTHER): Payer: Medicare Other | Admitting: Interventional Cardiology

## 2021-12-07 ENCOUNTER — Other Ambulatory Visit: Payer: Self-pay

## 2021-12-07 ENCOUNTER — Telehealth: Payer: Self-pay

## 2021-12-07 VITALS — BP 140/62 | HR 61 | Ht 70.0 in | Wt 236.2 lb

## 2021-12-07 DIAGNOSIS — I5032 Chronic diastolic (congestive) heart failure: Secondary | ICD-10-CM | POA: Diagnosis not present

## 2021-12-07 DIAGNOSIS — F1010 Alcohol abuse, uncomplicated: Secondary | ICD-10-CM

## 2021-12-07 DIAGNOSIS — E7849 Other hyperlipidemia: Secondary | ICD-10-CM

## 2021-12-07 DIAGNOSIS — I10 Essential (primary) hypertension: Secondary | ICD-10-CM | POA: Diagnosis not present

## 2021-12-07 DIAGNOSIS — R7303 Prediabetes: Secondary | ICD-10-CM | POA: Diagnosis not present

## 2021-12-07 DIAGNOSIS — I25810 Atherosclerosis of coronary artery bypass graft(s) without angina pectoris: Secondary | ICD-10-CM

## 2021-12-07 DIAGNOSIS — H3560 Retinal hemorrhage, unspecified eye: Secondary | ICD-10-CM | POA: Diagnosis not present

## 2021-12-07 MED ORDER — INDAPAMIDE 1.25 MG PO TABS: 1.2500 mg | ORAL_TABLET | Freq: Every day | ORAL | 3 refills | Status: DC

## 2021-12-07 NOTE — Telephone Encounter (Signed)
Approved for Durolane, right knee. Buy & Bill Patient will be responsible for 20% OOP Co-pay of $20.00 No PA required   Appt. 12/16/2021

## 2021-12-07 NOTE — Patient Instructions (Addendum)
Medication Instructions:  1) Stay on Irbesartan.  Do not start Spironolactone. 2) START Lovol (Indapamide) 1.25mg  once daily  *If you need a refill on your cardiac medications before your next appointment, please call your pharmacy*   Lab Work: None  If you have labs (blood work) drawn today and your tests are completely normal, you will receive your results only by: MyChart Message (if you have MyChart) OR A paper copy in the mail If you have any lab test that is abnormal or we need to change your treatment, we will call you to review the results.   Testing/Procedures: Your physician has requested that you have a carotid duplex. This test is an ultrasound of the carotid arteries in your neck. It looks at blood flow through these arteries that supply the brain with blood. Allow one hour for this exam. There are no restrictions or special instructions.  Your physician has requested that you have an echocardiogram. Echocardiography is a painless test that uses sound waves to create images of your heart. It provides your doctor with information about the size and shape of your heart and how well your hearts chambers and valves are working. This procedure takes approximately one hour. There are no restrictions for this procedure.  Follow-Up: At [facility], you and your health needs are our priority.  As part of our continuing mission to provide you with exceptional heart care, we have created designated Provider Care Teams.  These Care Teams include your primary Cardiologist (physician) and Advanced Practice Providers (APPs -  Physician Assistants and Nurse Practitioners) who all work together to provide you with the care you need, when you need it.  We recommend signing up for the patient portal called "MyChart".  Sign up information is provided on this After Visit Summary.  MyChart is used to connect with patients for Virtual Visits (Telemedicine).  Patients are able to view lab/test  results, encounter notes, upcoming appointments, etc.  Non-urgent messages can be sent to your provider as well.   To learn more about what you can do with MyChart, go to [email or web address].    Your next appointment:   6 month(s)  The format for your next appointment:   In Person  Provider:   Lesleigh Noe, MD     Other Instructions

## 2021-12-09 ENCOUNTER — Encounter: Payer: Self-pay | Admitting: Emergency Medicine

## 2021-12-09 ENCOUNTER — Other Ambulatory Visit: Payer: Self-pay

## 2021-12-09 ENCOUNTER — Ambulatory Visit (INDEPENDENT_AMBULATORY_CARE_PROVIDER_SITE_OTHER): Payer: Medicare Other | Admitting: Emergency Medicine

## 2021-12-09 VITALS — BP 138/82 | HR 56 | Temp 97.8°F | Ht 70.0 in | Wt 235.0 lb

## 2021-12-09 DIAGNOSIS — R29818 Other symptoms and signs involving the nervous system: Secondary | ICD-10-CM

## 2021-12-09 DIAGNOSIS — I110 Hypertensive heart disease with heart failure: Secondary | ICD-10-CM | POA: Diagnosis not present

## 2021-12-09 DIAGNOSIS — K7030 Alcoholic cirrhosis of liver without ascites: Secondary | ICD-10-CM

## 2021-12-09 DIAGNOSIS — E785 Hyperlipidemia, unspecified: Secondary | ICD-10-CM

## 2021-12-09 DIAGNOSIS — I5032 Chronic diastolic (congestive) heart failure: Secondary | ICD-10-CM | POA: Diagnosis not present

## 2021-12-09 DIAGNOSIS — H35033 Hypertensive retinopathy, bilateral: Secondary | ICD-10-CM

## 2021-12-09 DIAGNOSIS — I10 Essential (primary) hypertension: Secondary | ICD-10-CM | POA: Diagnosis not present

## 2021-12-09 DIAGNOSIS — I700 Atherosclerosis of aorta: Secondary | ICD-10-CM | POA: Diagnosis not present

## 2021-12-09 NOTE — Patient Instructions (Signed)
Health Maintenance After Age 75 After age 75, you are at a higher risk for certain long-term diseases and infections as well as injuries from falls. Falls are a major cause of broken bones and head injuries in people who are older than age 75. Getting regular preventive care can help to keep you healthy and well. Preventive care includes getting regular testing and making lifestyle changes as recommended by your health care provider. Talk with your health care provider about: Which screenings and tests you should have. A screening is a test that checks for a disease when you have no symptoms. A diet and exercise plan that is right for you. What should I know about screenings and tests to prevent falls? Screening and testing are the best ways to find a health problem early. Early diagnosis and treatment give you the best chance of managing medical conditions that are common after age 75. Certain conditions and lifestyle choices may make you more likely to have a fall. Your health care provider may recommend: Regular vision checks. Poor vision and conditions such as cataracts can make you more likely to have a fall. If you wear glasses, make sure to get your prescription updated if your vision changes. Medicine review. Work with your health care provider to regularly review all of the medicines you are taking, including over-the-counter medicines. Ask your health care provider about any side effects that may make you more likely to have a fall. Tell your health care provider if any medicines that you take make you feel dizzy or sleepy. Strength and balance checks. Your health care provider may recommend certain tests to check your strength and balance while standing, walking, or changing positions. Foot health exam. Foot pain and numbness, as well as not wearing proper footwear, can make you more likely to have a fall. Screenings, including: Osteoporosis screening. Osteoporosis is a condition that causes  the bones to get weaker and break more easily. Blood pressure screening. Blood pressure changes and medicines to control blood pressure can make you feel dizzy. Depression screening. You may be more likely to have a fall if you have a fear of falling, feel depressed, or feel unable to do activities that you used to do. Alcohol use screening. Using too much alcohol can affect your balance and may make you more likely to have a fall. Follow these instructions at home: Lifestyle Do not drink alcohol if: Your health care provider tells you not to drink. If you drink alcohol: Limit how much you have to: 0-1 drink a day for women. 0-2 drinks a day for men. Know how much alcohol is in your drink. In the [place] one drink equals one 12 oz bottle of beer (355 mL), one 5 oz glass of wine (148 mL), or one 1 oz glass of hard liquor (44 mL). Do not use any products that contain nicotine or tobacco. These products include cigarettes, chewing tobacco, and vaping devices, such as e-cigarettes. If you need help quitting, ask your health care provider. Activity  Follow a regular exercise program to stay fit. This will help you maintain your balance. Ask your health care provider what types of exercise are appropriate for you. If you need a cane or walker, use it as recommended by your health care provider. Wear supportive shoes that have nonskid soles. Safety  Remove any tripping hazards, such as rugs, cords, and clutter. Install safety equipment such as grab bars in bathrooms and safety rails on stairs. Keep rooms and walkways   well-lit. General instructions Talk with your health care provider about your risks for falling. Tell your health care provider if: You fall. Be sure to tell your health care provider about all falls, even ones that seem minor. You feel dizzy, tiredness (fatigue), or off-balance. Take over-the-counter and prescription medicines only as told by your health care provider. These include  supplements. Eat a healthy diet and maintain a healthy weight. A healthy diet includes low-fat dairy products, low-fat (lean) meats, and fiber from whole grains, beans, and lots of fruits and vegetables. Stay current with your vaccines. Schedule regular health, dental, and eye exams. Summary Having a healthy lifestyle and getting preventive care can help to protect your health and wellness after age 75. Screening and testing are the best way to find a health problem early and help you avoid having a fall. Early diagnosis and treatment give you the best chance for managing medical conditions that are more common for people who are older than age 75. Falls are a major cause of broken bones and head injuries in people who are older than age 75. Take precautions to prevent a fall at home. Work with your health care provider to learn what changes you can make to improve your health and wellness and to prevent falls. This information is not intended to replace advice given to you by your health care provider. Make sure you discuss any questions you have with your health care provider. Document Revised: 04/06/2021 Document Reviewed: 04/06/2021 Elsevier Patient Education  2022 [employer]  

## 2021-12-09 NOTE — Assessment & Plan Note (Signed)
Stable.  Continue Repatha.

## 2021-12-09 NOTE — Progress Notes (Signed)
[name] 76 y.o.   Chief Complaint  Patient presents with   Follow-up    Referral for sleep apnea and check liver function.    HISTORY OF PRESENT ILLNESS: This is a 76 y.o. male here for follow-up.  Needs referral for sleep apnea and GI for evaluation of liver cirrhosis. Wakes up tired, snores, has some daytime somnolence.  Told he may have sleep apnea.  Needs evaluation. Compliant with medications and diet. Most recent blood work done 1 week ago normal. Recent Results (from the past 2160 hour(s))  Comp Met (CMET)     Status: Abnormal   Collection Time: 09/17/21 11:15 AM  Result Value Ref Range   Glucose 87 70 - 99 mg/dL   BUN 23 8 - 27 mg/dL   Creatinine, Ser 1.18 0.76 - 1.27 mg/dL   eGFR 65 >59 mL/min/1.73   BUN/Creatinine Ratio 19 10 - 24   Sodium 139 134 - 144 mmol/L   Potassium 4.3 3.5 - 5.2 mmol/L   Chloride 103 96 - 106 mmol/L   CO2 22 20 - 29 mmol/L   Calcium 9.3 8.6 - 10.2 mg/dL   Total Protein 8.4 6.0 - 8.5 g/dL   Albumin 4.4 3.7 - 4.7 g/dL   Globulin, Total 4.0 1.5 - 4.5 g/dL   Albumin/Globulin Ratio 1.1 (L) 1.2 - 2.2   Bilirubin Total 0.6 0.0 - 1.2 mg/dL   Alkaline Phosphatase 68 44 - 121 IU/L   AST 28 0 - 40 IU/L   ALT 30 0 - 44 IU/L  HgB A1c     Status: Abnormal   Collection Time: 09/17/21 11:15 AM  Result Value Ref Range   Hgb A1c MFr Bld 5.9 (H) 4.8 - 5.6 %    Comment:          Prediabetes: 5.7 - 6.4          Diabetes: >6.4          Glycemic control for adults with diabetes: <7.0    Est. average glucose Bld gHb Est-mCnc 123 mg/dL  POC CBG monitoring     Status: None   Collection Time: 11/27/21  6:10 PM  Result Value Ref Range   Glucose-Capillary 85 70 - 99 mg/dL    Comment: Glucose reference range applies only to samples taken after fasting for at least 8 hours.  POC Urinalysis dipstick     Status: Abnormal   Collection Time: 11/27/21  6:36 PM  Result Value Ref Range   Glucose, UA NEGATIVE NEGATIVE mg/dL   Bilirubin Urine NEGATIVE NEGATIVE    Ketones, ur NEGATIVE NEGATIVE mg/dL   Specific Gravity, Urine 1.025 1.005 - 1.030   Hgb urine dipstick NEGATIVE NEGATIVE   pH 6.0 5.0 - 8.0   Protein, ur NEGATIVE NEGATIVE mg/dL   Urobilinogen, UA 2.0 (H) 0.0 - 1.0 mg/dL   Nitrite NEGATIVE NEGATIVE   Leukocytes,Ua NEGATIVE NEGATIVE    Comment: Biochemical Testing Only. Please order routine urinalysis from main lab if confirmatory testing is needed.  Urine Culture     Status: None   Collection Time: 11/27/21  6:52 PM   Specimen: Urine, Clean Catch  Result Value Ref Range   Specimen Description URINE, CLEAN CATCH    Special Requests NONE    Culture      NO GROWTH Performed at [facility], [place] [address] [place], [place] [address]    Report Status 11/29/2021 FINAL   Lipid panel     Status: Abnormal  Collection Time: 12/03/21  2:07 PM  Result Value Ref Range   Cholesterol, Total 128 100 - 199 mg/dL   Triglycerides 174 (H) 0 - 149 mg/dL   HDL 44 >39 mg/dL   VLDL Cholesterol Cal 29 5 - 40 mg/dL   LDL Chol Calc (NIH) 55 0 - 99 mg/dL   Chol/HDL Ratio 2.9 0.0 - 5.0 ratio    Comment:                                   T. Chol/HDL Ratio                                             Men  Women                               1/2 Avg.Risk  3.4    3.3                                   Avg.Risk  5.0    4.4                                2X Avg.Risk  9.6    7.1                                3X Avg.Risk 23.4   11.0   Comp Met (CMET)     Status: Abnormal   Collection Time: 12/03/21  2:07 PM  Result Value Ref Range   Glucose 91 70 - 99 mg/dL   BUN 23 8 - 27 mg/dL   Creatinine, Ser 1.11 0.76 - 1.27 mg/dL   eGFR 70 >59 mL/min/1.73   BUN/Creatinine Ratio 21 10 - 24   Sodium 136 134 - 144 mmol/L   Potassium 5.0 3.5 - 5.2 mmol/L   Chloride 103 96 - 106 mmol/L   CO2 21 20 - 29 mmol/L   Calcium 9.0 8.6 - 10.2 mg/dL   Total Protein 8.2 6.0 - 8.5 g/dL   Albumin 4.3 3.7 - 4.7 g/dL   Globulin, Total 3.9 1.5 - 4.5 g/dL   Albumin/Globulin  Ratio 1.1 (L) 1.2 - 2.2   Bilirubin Total 0.5 0.0 - 1.2 mg/dL   Alkaline Phosphatase 79 44 - 121 IU/L   AST 20 0 - 40 IU/L   ALT 26 0 - 44 IU/L  Direct LDL     Status: None   Collection Time: 12/03/21  2:07 PM  Result Value Ref Range   LDL Direct 47 0 - 99 mg/dL    Has no other complaints or medical concerns today. No longer drinking.  HPI   Prior to Admission medications   Medication Sig Start Date End Date Taking? Authorizing Provider  escitalopram (LEXAPRO) 5 MG tablet Take 5 mg by mouth daily. 11/09/21  Yes [provider]  Evolocumab (REPATHA SURECLICK) 277 MG/ML SOAJ Inject 140 mg into the skin every 14 (fourteen) days. 03/23/21  Yes Belva Crome, MD  indapamide (LOZOL) 1.25 MG tablet Take 1 tablet (1.25 mg total) by  mouth daily. 12/07/21  Yes Belva Crome, MD  sildenafil (VIAGRA) 100 MG tablet Take 100 mg by mouth daily as needed. 06/17/21  Yes [provider]  spironolactone (ALDACTONE) 25 MG tablet Take 1 tablet (25 mg total) by mouth daily. 12/03/21  Yes Belva Crome, MD  traMADol (ULTRAM) 50 MG tablet Take 1-2 tablets (50-100 mg total) by mouth every 6 (six) hours as needed. 11/03/21  Yes Aundra Dubin, PA-C    Allergies  Allergen Reactions   Codeine Palpitations   Vicodin [Hydrocodone-Acetaminophen] Nausea And Vomiting, Swelling and Palpitations    Patient Active Problem List   Diagnosis Date Noted   Atherosclerosis of aorta ([place]) 06/16/2021   Hypertensive heart disease with chronic diastolic congestive heart failure ([place]) 39/76/7341   Alcoholic cirrhosis of liver without ascites ([place]) 06/16/2021   Calculus of gallbladder without cholecystitis without obstruction 06/16/2021   Chronic diastolic heart failure ([place]) 12/11/2020   Alcohol abuse with alcohol-induced mood disorder ([place]) 06/16/2019   PTSD (post-traumatic stress disorder) 06/16/2019   Thrombocytopenia ([place]) 02/24/2019   Chronic alcohol abuse 12/09/2015   Coronary artery disease  involving coronary bypass graft of native heart without angina pectoris 08/07/2014   Essential hypertension 08/07/2014   Dyslipidemia 08/07/2014    Past Medical History:  Diagnosis Date   Anginal pain ([place]) 01/2005   Anxiety    Arthritis    "knees"   Chronic lower back pain    Coronary artery disease    Depression    Hepatitis    "don't know what kind; they treated me for it; was a long time ago" (12/11/2018)   High cholesterol    History of bronchitis    "used to get it alot" (08/25/2012)   Hypertension    Panic attacks    PTSD (post-traumatic stress disorder)    "have been treated in the past" (08/25/2012)    Past Surgical History:  Procedure Laterality Date   CARDIAC CATHETERIZATION  01/2005   CORONARY ARTERY BYPASS GRAFT  01/2005   CABG X3   Shrapnel Left 1969   LLE; left lateral thumb (required grafting)   VARICOSE VEIN SURGERY  1980's   LLE    Social History   Socioeconomic History   Marital status: Divorced    Spouse name: Not on file   Number of children: Not on file   Years of education: Not on file   Highest education level: Not on file  Occupational History   Not on file  Tobacco Use   Smoking status: Former    Packs/day: 2.00    Years: 5.00    Pack years: 10.00    Types: Cigarettes   Smokeless tobacco: Never   Tobacco comments:    08/25/2012 "quit smoking cigarettes 40 yr ago"  Vaping Use   Vaping Use: Never used  Substance and Sexual Activity   Alcohol use: Yes    Alcohol/week: 60.0 standard drinks    Types: 60 Standard drinks or equivalent per week    Comment: 12/11/2018 "~ 1 pint of vodka/day"   Drug use: Yes    Types: Marijuana    Comment: 08/25/2012 "last marijuana 10 years ago"   Sexual activity: Yes  Other Topics Concern   Not on file  Social History Narrative   Not on file   Social Determinants of Health   Financial Resource Strain: Not on file  Food Insecurity: Not on file  Transportation Needs: Not on file  Physical Activity:  Not on file  Stress: Not  on file  Social Connections: Not on file  Intimate Partner Violence: Not on file    Family History  Problem Relation Age of Onset   Diabetes Mother    Heart disease Father    Cancer Brother      Review of Systems  Constitutional:  Positive for malaise/fatigue. Negative for chills and fever.  HENT: Negative.  Negative for congestion and sore throat.   Respiratory: Negative.  Negative for cough and shortness of breath.   Cardiovascular: Negative.  Negative for chest pain and palpitations.  Gastrointestinal: Negative.  Negative for abdominal pain, blood in stool, diarrhea, melena, nausea and vomiting.  Genitourinary: Negative.  Negative for dysuria and hematuria.  Musculoskeletal: Negative.   Skin: Negative.  Negative for rash.  Neurological: Negative.  Negative for dizziness and headaches.       Sleep problems  All other systems reviewed and are negative. Today's Vitals   12/09/21 0858  BP: 138/82  Pulse: (!) 56  Temp: 97.8 F (36.6 C)  TempSrc: Oral  SpO2: 96%  Weight: 235 lb (106.6 kg)  Height: 5' 10"  (1.778 m)   Body mass index is 33.72 kg/m. Wt Readings from Last 3 Encounters:  12/09/21 235 lb (106.6 kg)  12/07/21 236 lb 3.2 oz (107.1 kg)  12/03/21 231 lb (104.8 kg)     Physical Exam Vitals reviewed.  Constitutional:      Appearance: Normal appearance.  HENT:     Head: Normocephalic.  Eyes:     Extraocular Movements: Extraocular movements intact.     Conjunctiva/sclera: Conjunctivae normal.     Pupils: Pupils are equal, round, and reactive to light.  Cardiovascular:     Rate and Rhythm: Normal rate and regular rhythm.     Pulses: Normal pulses.     Heart sounds: Normal heart sounds.  Pulmonary:     Effort: Pulmonary effort is normal.     Breath sounds: Normal breath sounds.  Abdominal:     Palpations: Abdomen is soft.     Tenderness: There is no abdominal tenderness.  Musculoskeletal:     Cervical back: Normal range of  motion and neck supple. No tenderness.     Right lower leg: No edema.     Left lower leg: No edema.  Skin:    General: Skin is warm and dry.     Capillary Refill: Capillary refill takes less than 2 seconds.     Coloration: Skin is not jaundiced.  Neurological:     General: No focal deficit present.     Mental Status: He is alert and oriented to person, place, and time.     Comments: No asterixis.  No signs of hepatic encephalopathy.  Psychiatric:        Mood and Affect: Mood normal.        Behavior: Behavior normal.     ASSESSMENT & PLAN: Problem List Items Addressed This Visit       Cardiovascular and Mediastinum   Essential hypertension    Well-controlled hypertension. BP Readings from Last 3 Encounters:  12/09/21 138/82  12/07/21 140/62  12/03/21 (!) 142/82  Continue Aldactone 25 mg daily.       Atherosclerosis of aorta (HCC)    Stable.  Continue Repatha.      Hypertensive heart disease with chronic diastolic congestive heart failure (HCC)    Stable and normovolemic.  Continue indapamide 1.25 mg daily.        Digestive   Alcoholic cirrhosis of liver without ascites ([place]) -  Primary    Stable.  No signs of hepatic encephalopathy.  No jaundiced.  Well compensated. Needs GI referral for evaluation of liver cirrhosis.      Relevant Orders   Ambulatory referral to Gastroenterology     Other   Dyslipidemia    Intolerant to statin therapy.  Stable.  Diet and nutrition discussed. Continue treatment with Repatha.      Other Visit Diagnoses     Hypertensive retinopathy of both eyes       Relevant Orders   Ambulatory referral to Ophthalmology   Suspected sleep apnea       Relevant Orders   Ambulatory referral to Neurology      Patient Instructions  Health Maintenance After Age 51 After age 26, you are at a higher risk for certain long-term diseases and infections as well as injuries from falls. Falls are a major cause of broken bones and head injuries in  people who are older than age 73. Getting regular preventive care can help to keep you healthy and well. Preventive care includes getting regular testing and making lifestyle changes as recommended by your health care provider. Talk with your health care provider about: Which screenings and tests you should have. A screening is a test that checks for a disease when you have no symptoms. A diet and exercise plan that is right for you. What should I know about screenings and tests to prevent falls? Screening and testing are the best ways to find a health problem early. Early diagnosis and treatment give you the best chance of managing medical conditions that are common after age 36. Certain conditions and lifestyle choices may make you more likely to have a fall. Your health care provider may recommend: Regular vision checks. Poor vision and conditions such as cataracts can make you more likely to have a fall. If you wear glasses, make sure to get your prescription updated if your vision changes. Medicine review. Work with your health care provider to regularly review all of the medicines you are taking, including over-the-counter medicines. Ask your health care provider about any side effects that may make you more likely to have a fall. Tell your health care provider if any medicines that you take make you feel dizzy or sleepy. Strength and balance checks. Your health care provider may recommend certain tests to check your strength and balance while standing, walking, or changing positions. Foot health exam. Foot pain and numbness, as well as not wearing proper footwear, can make you more likely to have a fall. Screenings, including: Osteoporosis screening. Osteoporosis is a condition that causes the bones to get weaker and break more easily. Blood pressure screening. Blood pressure changes and medicines to control blood pressure can make you feel dizzy. Depression screening. You may be more likely to  have a fall if you have a fear of falling, feel depressed, or feel unable to do activities that you used to do. Alcohol use screening. Using too much alcohol can affect your balance and may make you more likely to have a fall. Follow these instructions at home: Lifestyle Do not drink alcohol if: Your health care provider tells you not to drink. If you drink alcohol: Limit how much you have to: 0-1 drink a day for women. 0-2 drinks a day for men. Know how much alcohol is in your drink. In the [place] one drink equals one 12 oz bottle of beer (355 mL), one 5 oz glass of wine (148 mL), or one  1 oz glass of hard liquor (44 mL). Do not use any products that contain nicotine or tobacco. These products include cigarettes, chewing tobacco, and vaping devices, such as e-cigarettes. If you need help quitting, ask your health care provider. Activity  Follow a regular exercise program to stay fit. This will help you maintain your balance. Ask your health care provider what types of exercise are appropriate for you. If you need a cane or walker, use it as recommended by your health care provider. Wear supportive shoes that have nonskid soles. Safety  Remove any tripping hazards, such as rugs, cords, and clutter. Install safety equipment such as grab bars in bathrooms and safety rails on stairs. Keep rooms and walkways well-lit. General instructions Talk with your health care provider about your risks for falling. Tell your health care provider if: You fall. Be sure to tell your health care provider about all falls, even ones that seem minor. You feel dizzy, tiredness (fatigue), or off-balance. Take over-the-counter and prescription medicines only as told by your health care provider. These include supplements. Eat a healthy diet and maintain a healthy weight. A healthy diet includes low-fat dairy products, low-fat (lean) meats, and fiber from whole grains, beans, and lots of fruits and vegetables. Stay  current with your vaccines. Schedule regular health, dental, and eye exams. Summary Having a healthy lifestyle and getting preventive care can help to protect your health and wellness after age 82. Screening and testing are the best way to find a health problem early and help you avoid having a fall. Early diagnosis and treatment give you the best chance for managing medical conditions that are more common for people who are older than age 47. Falls are a major cause of broken bones and head injuries in people who are older than age 5. Take precautions to prevent a fall at home. Work with your health care provider to learn what changes you can make to improve your health and wellness and to prevent falls. This information is not intended to replace advice given to you by your health care provider. Make sure you discuss any questions you have with your health care provider. Document Revised: 04/06/2021 Document Reviewed: 04/06/2021 Elsevier Patient Education  2022 [place], MD [facility]

## 2021-12-09 NOTE — Assessment & Plan Note (Signed)
Intolerant to statin therapy.  Stable.  Diet and nutrition discussed. Continue treatment with Repatha.

## 2021-12-09 NOTE — Assessment & Plan Note (Signed)
Well-controlled hypertension. BP Readings from Last 3 Encounters:  12/09/21 138/82  12/07/21 140/62  12/03/21 (!) 142/82  Continue Aldactone 25 mg daily.

## 2021-12-09 NOTE — Assessment & Plan Note (Addendum)
Stable and normovolemic.  Continue indapamide 1.25 mg daily.

## 2021-12-09 NOTE — Assessment & Plan Note (Signed)
Stable.  No signs of hepatic encephalopathy.  No jaundiced.  Well compensated. Needs GI referral for evaluation of liver cirrhosis.

## 2021-12-10 ENCOUNTER — Other Ambulatory Visit (HOSPITAL_COMMUNITY): Payer: Self-pay | Admitting: Interventional Cardiology

## 2021-12-10 ENCOUNTER — Ambulatory Visit (HOSPITAL_COMMUNITY)
Admission: RE | Admit: 2021-12-10 | Discharge: 2021-12-10 | Disposition: A | Discharge status: Home / Self Care | Payer: Medicare Other | Source: Ambulatory Visit | Attending: Cardiology | Admitting: Cardiology

## 2021-12-10 DIAGNOSIS — H3560 Retinal hemorrhage, unspecified eye: Secondary | ICD-10-CM | POA: Diagnosis not present

## 2021-12-10 DIAGNOSIS — H3562 Retinal hemorrhage, left eye: Secondary | ICD-10-CM

## 2021-12-10 DIAGNOSIS — I6523 Occlusion and stenosis of bilateral carotid arteries: Secondary | ICD-10-CM

## 2021-12-11 ENCOUNTER — Encounter: Payer: Self-pay | Admitting: Gastroenterology

## 2021-12-14 ENCOUNTER — Ambulatory Visit (HOSPITAL_COMMUNITY): Payer: Medicare Other | Attending: Cardiology

## 2021-12-16 ENCOUNTER — Ambulatory Visit: Payer: Medicare Other | Admitting: Orthopaedic Surgery

## 2021-12-16 ENCOUNTER — Telehealth: Payer: Self-pay | Admitting: Radiology

## 2021-12-16 ENCOUNTER — Ambulatory Visit: Payer: Medicare Other | Admitting: Physical Medicine and Rehabilitation

## 2021-12-16 NOTE — Telephone Encounter (Signed)
Patient called triage line stating that he thought his appt for Durolane injection was at 12:45p today instead of 10:45am this morning. He was hopeful he could come in this afternoon and get the injection because he needs it badly. I advised that Dr. Roda Shutters is not in the office this afternoon. Patient would like to know if he could be worked in with another provider to give the injection this afternoon or if he needs to wait for Dr. Roda Shutters. If he needs to wait, he requests to be worked in as soon as possible.   Please advise CB for patient is [phone].

## 2021-12-16 NOTE — Telephone Encounter (Signed)
Cancellation on Dr. Phoebe Sharps schedule for tomorrow, patient to take that appt.

## 2021-12-17 ENCOUNTER — Other Ambulatory Visit: Payer: Self-pay

## 2021-12-17 ENCOUNTER — Ambulatory Visit (INDEPENDENT_AMBULATORY_CARE_PROVIDER_SITE_OTHER): Payer: Medicare Other | Admitting: Orthopaedic Surgery

## 2021-12-17 ENCOUNTER — Encounter: Payer: Self-pay | Admitting: Orthopaedic Surgery

## 2021-12-17 DIAGNOSIS — M1711 Unilateral primary osteoarthritis, right knee: Secondary | ICD-10-CM | POA: Diagnosis not present

## 2021-12-17 MED ORDER — SODIUM HYALURONATE 60 MG/3ML IX PRSY
60.0000 mg | PREFILLED_SYRINGE | INTRA_ARTICULAR | Status: AC | PRN
  Administered 2021-12-17: 60 mg via INTRA_ARTICULAR

## 2021-12-17 NOTE — Progress Notes (Signed)
Office Visit Note   Patient: [name]           Date of Birth: October 21, 1946           MRN: [id number] Visit Date: 12/17/2021              Requested by: Georgina Quint, MD [address] [place],  [place] [address] PCP: Georgina Quint, MD   Assessment & Plan: Visit Diagnoses:  1. Primary osteoarthritis of right knee     Plan: [name] is here for right knee durolane injection.    Follow-Up Instructions: No follow-ups on file.   Orders:  No orders of the defined types were placed in this encounter.  No orders of the defined types were placed in this encounter.     Procedures: Large Joint Inj: R knee on 12/17/2021 4:51 PM Indications: pain Details: 22 G needle  Arthrogram: No  Medications: 60 mg Sodium Hyaluronate 60 MG/3ML Outcome: tolerated well, no immediate complications Patient was prepped and draped in the usual sterile fashion.      Clinical Data: No additional findings.   Subjective: Chief Complaint  Patient presents with   Right Knee - Follow-up    Durolane    HPI  Review of Systems   Objective: Vital Signs: There were no vitals taken for this visit.  Physical Exam  Ortho Exam  Specialty Comments:  No specialty comments available.  Imaging: No results found.   PMFS History: Patient Active Problem List   Diagnosis Date Noted   Atherosclerosis of aorta (HCC) 06/16/2021   Hypertensive heart disease with chronic diastolic congestive heart failure (HCC) 06/16/2021   Alcoholic cirrhosis of liver without ascites (HCC) 06/16/2021   Calculus of gallbladder without cholecystitis without obstruction 06/16/2021   Chronic diastolic heart failure (HCC) 12/11/2020   Alcohol abuse with alcohol-induced mood disorder (HCC) 06/16/2019   PTSD (post-traumatic stress disorder) 06/16/2019   Thrombocytopenia (HCC) 02/24/2019   Chronic alcohol abuse 12/09/2015   Coronary artery disease involving coronary bypass graft of native heart  without angina pectoris 08/07/2014   Essential hypertension 08/07/2014   Dyslipidemia 08/07/2014   Past Medical History:  Diagnosis Date   Anginal pain (HCC) 01/2005   Anxiety    Arthritis    "knees"   Chronic lower back pain    Coronary artery disease    Depression    Hepatitis    "don't know what kind; they treated me for it; was a long time ago" (12/11/2018)   High cholesterol    History of bronchitis    "used to get it alot" (08/25/2012)   Hypertension    Panic attacks    PTSD (post-traumatic stress disorder)    "have been treated in the past" (08/25/2012)    Family History  Problem Relation Age of Onset   Diabetes Mother    Heart disease Father    Cancer Brother     Past Surgical History:  Procedure Laterality Date   CARDIAC CATHETERIZATION  01/2005   CORONARY ARTERY BYPASS GRAFT  01/2005   CABG X3   Shrapnel Left 1969   LLE; left lateral thumb (required grafting)   VARICOSE VEIN SURGERY  1980's   LLE   Social History   Occupational History   Not on file  Tobacco Use   Smoking status: Former    Packs/day: 2.00    Years: 5.00    Pack years: 10.00    Types: Cigarettes   Smokeless tobacco: Never   Tobacco  comments:    08/25/2012 "quit smoking cigarettes 40 yr ago"  Vaping Use   Vaping Use: Never used  Substance and Sexual Activity   Alcohol use: Yes    Alcohol/week: 60.0 standard drinks    Types: 60 Standard drinks or equivalent per week    Comment: 12/11/2018 "~ 1 pint of vodka/day"   Drug use: Yes    Types: Marijuana    Comment: 08/25/2012 "last marijuana 10 years ago"   Sexual activity: Yes

## 2021-12-22 ENCOUNTER — Ambulatory Visit: Payer: Medicare Other | Admitting: Orthopaedic Surgery

## 2021-12-23 NOTE — Progress Notes (Signed)
[name] - 76 y.o. male MRN [id number]  Date of birth: Mar 31, 1946  Office Visit Note: Visit Date: 11/18/2021 PCP: Georgina Quint, MD Referred by: Georgina Quint, *  Subjective: Chief Complaint  Patient presents with   Lower Back - Pain   Right Leg - Pain   HPI:  [name] is a 76 y.o. male who comes in today at the request of Dr. Glee Arvin for planned Right L5-S1 Lumbar Interlaminar epidural steroid injection with fluoroscopic guidance.  The patient has failed conservative care including home exercise, medications, time and activity modification.  This injection will be diagnostic and hopefully therapeutic.  Please see requesting physician notes for further details and justification.  ROS Otherwise per HPI.  Assessment & Plan: Visit Diagnoses:    ICD-10-CM   1. Lumbar radiculopathy  M54.16 XR C-ARM NO REPORT    Epidural Steroid injection    methylPREDNISolone acetate (DEPO-MEDROL) injection 80 mg      Plan: No additional findings.   Meds & Orders:  Meds ordered this encounter  Medications   methylPREDNISolone acetate (DEPO-MEDROL) injection 80 mg    Orders Placed This Encounter  Procedures   XR C-ARM NO REPORT   Epidural Steroid injection    Follow-up: Return for visit to requesting provider as needed.   Procedures: No procedures performed  Lumbar Epidural Steroid Injection - Interlaminar Approach with Fluoroscopic Guidance  Patient: [name]      Date of Birth: [birth year] MRN: [id number] PCP: Georgina Quint, MD      Visit Date: 11/18/2021   Universal Protocol:     Consent Given By: the patient  Position: PRONE  Additional Comments: Vital signs were monitored before and after the procedure. Patient was prepped and draped in the usual sterile fashion. The correct patient, procedure, and site was verified.   Injection Procedure Details:   Procedure diagnoses: Lumbar radiculopathy [M54.16]   Meds Administered:   Meds ordered this encounter  Medications   methylPREDNISolone acetate (DEPO-MEDROL) injection 80 mg     Laterality: Right  Location/Site:  L5-S1  Needle: 3.5 in., 20 ga. Tuohy  Needle Placement: Paramedian epidural  Findings:   -Comments: Excellent flow of contrast into the epidural space.  Procedure Details: Using a paramedian approach from the side mentioned above, the region overlying the inferior lamina was localized under fluoroscopic visualization and the soft tissues overlying this structure were infiltrated with 4 ml. of 1% Lidocaine without Epinephrine. The Tuohy needle was inserted into the epidural space using a paramedian approach.   The epidural space was localized using loss of resistance along with counter oblique bi-planar fluoroscopic views.  After negative aspirate for air, blood, and CSF, a 2 ml. volume of Isovue-250 was injected into the epidural space and the flow of contrast was observed. Radiographs were obtained for documentation purposes.    The injectate was administered into the level noted above.   Additional Comments:  No complications occurred Dressing: 2 x 2 sterile gauze and Band-Aid    Post-procedure details: Patient was observed during the procedure. Post-procedure instructions were reviewed.  Patient left the clinic in stable condition.   Clinical History: No specialty comments available.     Objective:  VS:  HT:     WT:    BMI:      BP:(!) 178/72   HR:72bpm   TEMP: ( )   RESP:  Physical Exam Vitals and nursing note reviewed.  Constitutional:      General:  He is not in acute distress.    Appearance: Normal appearance. He is not ill-appearing.  HENT:     Head: Normocephalic and atraumatic.     Right Ear: External ear normal.     Left Ear: External ear normal.     Nose: No congestion.  Eyes:     Extraocular Movements: Extraocular movements intact.  Cardiovascular:     Rate and Rhythm: Normal rate.     Pulses: Normal pulses.   Pulmonary:     Effort: Pulmonary effort is normal. No respiratory distress.  Abdominal:     General: There is no distension.     Palpations: Abdomen is soft.  Musculoskeletal:        General: No tenderness or signs of injury.     Cervical back: Neck supple.     Right lower leg: No edema.     Left lower leg: No edema.     Comments: Patient has good distal strength without clonus.  Skin:    Findings: No erythema or rash.  Neurological:     General: No focal deficit present.     Mental Status: He is alert and oriented to person, place, and time.     Sensory: No sensory deficit.     Motor: No weakness or abnormal muscle tone.     Coordination: Coordination normal.  Psychiatric:        Mood and Affect: Mood normal.        Behavior: Behavior normal.     Imaging: No results found.

## 2021-12-23 NOTE — Procedures (Signed)
Lumbar Epidural Steroid Injection - Interlaminar Approach with Fluoroscopic Guidance  Patient: [name]      Date of Birth: September 19, 1946 MRN: [id number] PCP: Georgina Quint, MD      Visit Date: 11/18/2021   Universal Protocol:     Consent Given By: the patient  Position: PRONE  Additional Comments: Vital signs were monitored before and after the procedure. Patient was prepped and draped in the usual sterile fashion. The correct patient, procedure, and site was verified.   Injection Procedure Details:   Procedure diagnoses: Lumbar radiculopathy [M54.16]   Meds Administered:  Meds ordered this encounter  Medications   methylPREDNISolone acetate (DEPO-MEDROL) injection 80 mg     Laterality: Right  Location/Site:  L5-S1  Needle: 3.5 in., 20 ga. Tuohy  Needle Placement: Paramedian epidural  Findings:   -Comments: Excellent flow of contrast into the epidural space.  Procedure Details: Using a paramedian approach from the side mentioned above, the region overlying the inferior lamina was localized under fluoroscopic visualization and the soft tissues overlying this structure were infiltrated with 4 ml. of 1% Lidocaine without Epinephrine. The Tuohy needle was inserted into the epidural space using a paramedian approach.   The epidural space was localized using loss of resistance along with counter oblique bi-planar fluoroscopic views.  After negative aspirate for air, blood, and CSF, a 2 ml. volume of Isovue-250 was injected into the epidural space and the flow of contrast was observed. Radiographs were obtained for documentation purposes.    The injectate was administered into the level noted above.   Additional Comments:  No complications occurred Dressing: 2 x 2 sterile gauze and Band-Aid    Post-procedure details: Patient was observed during the procedure. Post-procedure instructions were reviewed.  Patient left the clinic in stable condition.

## 2021-12-25 ENCOUNTER — Ambulatory Visit (HOSPITAL_COMMUNITY): Payer: Medicare Other | Attending: Cardiovascular Disease

## 2021-12-25 ENCOUNTER — Other Ambulatory Visit: Payer: Self-pay

## 2021-12-25 DIAGNOSIS — H3560 Retinal hemorrhage, unspecified eye: Secondary | ICD-10-CM | POA: Insufficient documentation

## 2021-12-25 DIAGNOSIS — I5032 Chronic diastolic (congestive) heart failure: Secondary | ICD-10-CM | POA: Diagnosis not present

## 2021-12-25 DIAGNOSIS — I25810 Atherosclerosis of coronary artery bypass graft(s) without angina pectoris: Secondary | ICD-10-CM

## 2021-12-25 LAB — ECHOCARDIOGRAM COMPLETE
Area-P 1/2: 2.74 cm2
P 1/2 time: 479 msec
S' Lateral: 2.4 cm

## 2021-12-28 ENCOUNTER — Telehealth: Payer: Self-pay | Admitting: Interventional Cardiology

## 2021-12-28 DIAGNOSIS — H3582 Retinal ischemia: Secondary | ICD-10-CM | POA: Diagnosis not present

## 2021-12-28 DIAGNOSIS — Z01818 Encounter for other preprocedural examination: Secondary | ICD-10-CM | POA: Diagnosis not present

## 2021-12-28 DIAGNOSIS — H25812 Combined forms of age-related cataract, left eye: Secondary | ICD-10-CM | POA: Diagnosis not present

## 2021-12-28 NOTE — Telephone Encounter (Signed)
He would like a call back after 4:00 PM if possible

## 2021-12-28 NOTE — Telephone Encounter (Signed)
Ronald Crome, MD  12/26/2021 10:31 AM EST     Let the patient know the heart is strong. Heart has mild stiffness that can cause shortness of breath. No clot or cuase for stroke. A copy will be sent to Horald Pollen, MD  Pt called and asked for the results of his ECHO. Relayed Dr Thompson Caul message above. Pt pleased and no questions at this time.

## 2021-12-28 NOTE — Telephone Encounter (Signed)
Patient is requesting a call back to discuss his echo results.

## 2021-12-30 ENCOUNTER — Encounter: Payer: Self-pay | Admitting: Gastroenterology

## 2021-12-30 ENCOUNTER — Other Ambulatory Visit (INDEPENDENT_AMBULATORY_CARE_PROVIDER_SITE_OTHER): Payer: Medicare Other

## 2021-12-30 ENCOUNTER — Ambulatory Visit (INDEPENDENT_AMBULATORY_CARE_PROVIDER_SITE_OTHER): Payer: Medicare Other | Admitting: Gastroenterology

## 2021-12-30 VITALS — BP 150/70 | HR 67 | Ht 70.0 in | Wt 238.0 lb

## 2021-12-30 DIAGNOSIS — Z1211 Encounter for screening for malignant neoplasm of colon: Secondary | ICD-10-CM

## 2021-12-30 DIAGNOSIS — K7460 Unspecified cirrhosis of liver: Secondary | ICD-10-CM

## 2021-12-30 LAB — PROTIME-INR
INR: 1.1 ratio — ABNORMAL HIGH (ref 0.8–1.0)
Prothrombin Time: 12.3 s (ref 9.6–13.1)

## 2021-12-30 LAB — CBC WITH DIFFERENTIAL/PLATELET
Basophils Absolute: 0 10*3/uL (ref 0.0–0.1)
Basophils Relative: 0.8 % (ref 0.0–3.0)
Eosinophils Absolute: 0.1 10*3/uL (ref 0.0–0.7)
Eosinophils Relative: 1 % (ref 0.0–5.0)
HCT: 41.4 % (ref 39.0–52.0)
Hemoglobin: 13.5 g/dL (ref 13.0–17.0)
Lymphocytes Relative: 41 % (ref 12.0–46.0)
Lymphs Abs: 2.1 10*3/uL (ref 0.7–4.0)
MCHC: 32.7 g/dL (ref 30.0–36.0)
MCV: 89.6 fl (ref 78.0–100.0)
Monocytes Absolute: 0.4 10*3/uL (ref 0.1–1.0)
Monocytes Relative: 8.5 % (ref 3.0–12.0)
Neutro Abs: 2.5 10*3/uL (ref 1.4–7.7)
Neutrophils Relative %: 48.7 % (ref 43.0–77.0)
Platelets: 109 10*3/uL — ABNORMAL LOW (ref 150.0–400.0)
RBC: 4.63 Mil/uL (ref 4.22–5.81)
RDW: 13 % (ref 11.5–15.5)
WBC: 5.2 10*3/uL (ref 4.0–10.5)

## 2021-12-30 LAB — GAMMA GT: GGT: 55 U/L — ABNORMAL HIGH (ref 7–51)

## 2021-12-30 NOTE — Patient Instructions (Signed)
If you are age 75 or older, your body mass index should be between 23-30. Your Body mass index is 34.15 kg/m. If this is out of the aforementioned range listed, please consider follow up with your Primary Care Provider.  If you are age 31 or younger, your body mass index should be between 19-25. Your Body mass index is 34.15 kg/m. If this is out of the aformentioned range listed, please consider follow up with your Primary Care Provider.   Your provider has requested that you go to the basement level for lab work before leaving today. Press "B" on the elevator. The lab is located at the first door on the left as you exit the elevator.  You have been scheduled for an MRI at [facility] on 01/06/22. Your appointment time is 5pm. Please arrive to admitting (at main entrance of the hospital) 30 minutes prior to your appointment time for registration purposes. Please make certain not to have anything to eat or drink 6 hours prior to your test. In addition, if you have any metal in your body, have a pacemaker or defibrillator, please be sure to let your ordering physician know. This test typically takes 45 minutes to 1 hour to complete. Should you need to reschedule, please call [phone] to do so.   The [facility] providers would like to encourage you to use [facility] to communicate with providers for non-urgent requests or questions.  Due to long hold times on the telephone, sending your provider a message by [facility] may be a faster and more efficient way to get a response.  Please allow 48 business hours for a response.  Please remember that this is for non-urgent requests.   It was a pleasure to see you today!  Thank you for trusting me with your gastrointestinal care!    Scott E.Tomasa Rand, MD

## 2021-12-30 NOTE — Progress Notes (Signed)
HPI : [name] is a very pleasant 76 year old male with a history of coronary artery disease, obesity, PTSD, anxiety and hypertension who was referred to [place] by Dr. Agustina Caroli for further evaluation and management of presumed alcoholic cirrhosis.  The patient reports history of very heavy alcohol use for about 10 years.  From around 2010-2021 he reports drinking about 1/5 of liquor per day.  He states he was doing this to help manage his PTSD and deal with a difficult marriage.  He said that he knew he was causing problems to his liver, and had been recommended to stop, but was not able to until about a year ago.  His last drink was 1 year and a month ago.  He has been doing well without cravings and no relapses.  Since stopping drinking he is doing much better.  He reports previous incidences of yellowing of the eyes and swelling of the abdomen and lower extremities.  He has not noticed any this for quite some time.  He reports a history of "hepatitis" which was diagnosed when he was in [place], and required hospitalization shortly after he had a [place].  He does not recall if this was a viral hepatitis.  He does report that he did drink heavily around that time as well.   As far as he knows he has never been diagnosed with ascites and never had a paracentesis.  He denies any significant changes in his cognition or ability to think or concentrate, but has noted that he has excessive daytime sleepiness and has trouble sleeping at night.  He has an upcoming sleep study to evaluate for sleep apnea.  He had a CT scan in June 2022 to evaluate left-sided abdominal pain which showed a nodular liver and a subtle area of increased attenuation in the dome.  There is no evidence of ascites at that time and no evidence of portal hypertension. He underwent a CT in June 2021 to evaluate left-sided abdominal pain.  The CT was without contrast and reported a normal-appearing liver He was admitted to [facility] in March 2020 with symptoms of hematemesis.  His hemoglobin was 12 on admission and down trended to 10.  He was also noted to have elevated liver enzymes, AST predominant with a mild bilirubin elevation (1.5-1.8).  As this was the start of the COVID pandemic, an upper endoscopy was not performed during that admission, and he did not follow-up with GI as an outpatient.  A CT of the abdomen during that admission did not show any evidence of cirrhosis or portal hypertension.  He does not think his ever had an upper endoscopy.  He reports having a colonoscopy for colon cancer screening 9 or 10 years ago, which was normal per patient.  Currently he has regular bowel movements, denies constipation, diarrhea or blood in stool  He has a history of coronary artery disease status post CABG in 2006 he denies symptoms of exertional dyspnea, orthopnea, chest pain/pressure, lightheadedness/dizziness, palpitations, presyncope.  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  He does get some swelling of his legs bilaterally.  He has gained weight in the past month, going from 231 pounds to 238 pounds He recently underwent an echocardiogram which was unremarkable except for grade 1 diastolic dysfunction.  He underwent carotid Dopplers, and was found to have moderate stenosis of the carotids and recommended to follow-up in 12 months.  His  most recent liver panel was normal, with AST 20, ALT 26, T. bili less than 1.  CBC notable for low platelets, at 104, otherwise normal. Past Medical History:  Diagnosis Date   Anginal pain ([place]) 01/2005   Anxiety    Arthritis    "knees"   Chronic lower back pain    Colon polyp    Coronary artery disease    Depression    Hepatitis    "don't know what kind; they treated me for it; was a long time ago" (12/11/2018)   High cholesterol    History of bronchitis    "used to get it alot" (08/25/2012)   Hypertension    Panic attacks     PTSD (post-traumatic stress disorder)    "have been treated in the past" (08/25/2012)     Past Surgical History:  Procedure Laterality Date   CARDIAC CATHETERIZATION  01/2005   CORONARY ARTERY BYPASS GRAFT  01/2005   CABG X3   Shrapnel Left 1969   LLE; left lateral thumb (required grafting)   VARICOSE VEIN SURGERY  1980's   LLE   Family History  Problem Relation Age of Onset   Diabetes Mother    Heart disease Father    Cancer Brother    Colon cancer Neg Hx    Liver cancer Neg Hx    Pancreatic cancer Neg Hx    Esophageal cancer Neg Hx    Stomach cancer Neg Hx    Social History   Tobacco Use   Smoking status: Former    Packs/day: 2.00    Years: 5.00    Pack years: 10.00    Types: Cigarettes   Smokeless tobacco: Never   Tobacco comments:    08/25/2012 "quit smoking cigarettes 40 yr ago"  Vaping Use   Vaping Use: Never used  Substance Use Topics   Alcohol use: Yes    Alcohol/week: 60.0 standard drinks    Types: 60 Standard drinks or equivalent per week    Comment: 12/11/2018 "~ 1 pint of vodka/day"   Drug use: Yes    Types: Marijuana    Comment: 08/25/2012 "last marijuana 10 years ago"   Current Outpatient Medications  Medication Sig Dispense Refill   escitalopram (LEXAPRO) 5 MG tablet Take 5 mg by mouth daily.     Evolocumab (REPATHA SURECLICK) [id number] MG/ML SOAJ Inject 140 mg into the skin every 14 (fourteen) days. 2 mL 11   indapamide (LOZOL) 1.25 MG tablet Take 1 tablet (1.25 mg total) by mouth daily. 90 tablet 3   irbesartan (AVAPRO) 300 MG tablet Take 300 mg by mouth daily.     sildenafil (VIAGRA) 100 MG tablet Take 100 mg by mouth daily as needed.     spironolactone (ALDACTONE) 25 MG tablet Take 1 tablet (25 mg total) by mouth daily. 30 tablet 5   No current facility-administered medications for this visit.   Allergies  Allergen Reactions   Codeine Palpitations   Vicodin [Hydrocodone-Acetaminophen] Nausea And Vomiting, Swelling and Palpitations     Review  of Systems: All systems reviewed and negative except where noted in HPI.    ECHOCARDIOGRAM COMPLETE  Result Date: 12/25/2021    ECHOCARDIOGRAM REPORT   Patient Name:   JEEVAN SCHUTH [facility] Date of Exam: 12/25/2021 Medical Rec #:  [id number]       Height:       70.0 in Accession #:    [id number]      Weight:       235.0 lb Date  of Birth:  1946-04-11       BSA:          2.235 m Patient Age:    77 years        BP:           138/82 mmHg Patient Gender: M               HR:           75 bpm. Exam Location:  [place] Procedure: 2D Echo, Cardiac Doppler and Color Doppler Indications:    I50.9* Heart failure (unspecified)  History:        Patient has prior history of Echocardiogram examinations, most                 recent 02/21/2014. CAD, Prior CABG; Risk Factors:Hypertension and                 Dyslipidemia. Chronic low back pain. Bronchitis.  Sonographer:    Diamond Nickel RCS Referring Phys: [place]  1. Left ventricular ejection fraction, by estimation, is 60 to 65%. The left ventricle has normal function. The left ventricle has no regional wall motion abnormalities. Left ventricular diastolic parameters are consistent with Grade I diastolic dysfunction (impaired relaxation).  2. Right ventricular systolic function is normal. The right ventricular size is normal.  3. Left atrial size was mildly dilated.  4. The mitral valve is abnormal. Trivial mitral valve regurgitation. No evidence of mitral stenosis.  5. Marked calcification of the non coronary cusp. The aortic valve is normal in structure. There is moderate calcification of the aortic valve. There is moderate thickening of the aortic valve. Aortic valve regurgitation is mild. Aortic valve sclerosis/calcification is present, without any evidence of aortic stenosis.  6. Aortic dilatation noted. There is mild dilatation of the ascending aorta, measuring 39 mm.  7. The inferior vena cava is normal in size with greater than 50% respiratory  variability, suggesting right atrial pressure of 3 mmHg. FINDINGS  Left Ventricle: Left ventricular ejection fraction, by estimation, is 60 to 65%. The left ventricle has normal function. The left ventricle has no regional wall motion abnormalities. The left ventricular internal cavity size was normal in size. There is  no left ventricular hypertrophy. Left ventricular diastolic parameters are consistent with Grade I diastolic dysfunction (impaired relaxation). Right Ventricle: The right ventricular size is normal. No increase in right ventricular wall thickness. Right ventricular systolic function is normal. Left Atrium: Left atrial size was mildly dilated. Right Atrium: Right atrial size was normal in size. Pericardium: There is no evidence of pericardial effusion. Mitral Valve: The mitral valve is abnormal. There is mild thickening of the mitral valve leaflet(s). There is mild calcification of the mitral valve leaflet(s). Mild mitral annular calcification. Trivial mitral valve regurgitation. No evidence of mitral valve stenosis. Tricuspid Valve: The tricuspid valve is normal in structure. Tricuspid valve regurgitation is mild . No evidence of tricuspid stenosis. Aortic Valve: Marked calcification of the non coronary cusp. The aortic valve is normal in structure. There is moderate calcification of the aortic valve. There is moderate thickening of the aortic valve. Aortic valve regurgitation is mild. Aortic regurgitation PHT measures 479 msec. Aortic valve sclerosis/calcification is present, without any evidence of aortic stenosis. Pulmonic Valve: The pulmonic valve was normal in structure. Pulmonic valve regurgitation is not visualized. No evidence of pulmonic stenosis. Aorta: Aortic dilatation noted. There is mild dilatation of the ascending aorta, measuring 39 mm. Venous: The inferior  vena cava is normal in size with greater than 50% respiratory variability, suggesting right atrial pressure of 3 mmHg.  IAS/Shunts: The interatrial septum was not well visualized.  LEFT VENTRICLE PLAX 2D LVIDd:         3.70 cm   Diastology LVIDs:         2.40 cm   LV e' medial:    6.85 cm/s LV PW:         1.20 cm   LV E/e' medial:  11.1 LV IVS:        1.10 cm   LV e' lateral:   12.20 cm/s LVOT diam:     2.15 cm   LV E/e' lateral: 6.2 LV SV:         103 LV SV Index:   46 LVOT Area:     3.63 cm  RIGHT VENTRICLE RV Basal diam:  3.40 cm RV S prime:     8.81 cm/s TAPSE (M-mode): 1.1 cm RVSP:           24.5 mmHg LEFT ATRIUM             Index        RIGHT ATRIUM           Index LA diam:        3.80 cm 1.70 cm/m   RA Pressure: 3.00 mmHg LA Vol (A2C):   69.8 ml 31.22 ml/m  RA Area:     15.70 cm LA Vol (A4C):   55.3 ml 24.74 ml/m  RA Volume:   40.25 ml  18.01 ml/m LA Biplane Vol: 64.0 ml 28.63 ml/m  AORTIC VALVE LVOT Vmax:   153.00 cm/s LVOT Vmean:  89.100 cm/s LVOT VTI:    0.284 m AI PHT:      479 msec  AORTA Ao Root diam: 3.10 cm Ao Asc diam:  3.90 cm MITRAL VALVE                TRICUSPID VALVE MV Area (PHT): 2.74 cm     TR Peak grad:   21.5 mmHg MV Decel Time: 277 msec     TR Vmax:        232.00 cm/s MV E velocity: 76.00 cm/s   Estimated RAP:  3.00 mmHg MV A velocity: 104.00 cm/s  RVSP:           24.5 mmHg MV E/A ratio:  0.73                             SHUNTS                             Systemic VTI:  0.28 m                             Systemic Diam: 2.15 cm Jenkins Rouge MD Electronically signed by Jenkins Rouge MD Signature Date/Time: 12/25/2021/7:35:42 PM    Final    VAS US CAROTID  Result Date: 12/10/2021 Carotid Arterial Duplex Study Patient Name:  CLOVER WARR [facility]  Date of Exam:   12/10/2021 Medical Rec #: [id number]        Accession #:    [id number] Date of Birth: June 12, 1946        Patient Gender: M Patient Age:   89 years Exam Location:  [facility] Procedure:      VAS US  CAROTID Referring Phys: Daneen Schick --------------------------------------------------------------------------------  Indications:       Retinal hemorrhage to  the left eye. Patient reports having                    dizziness, numbness and pain in the neck and possible syncope                    episodes, however, he his scheduled to have a sleep study                    done for possible sleep apnea. He denies any other                    cerebrovascular symptoms. Risk Factors:      Hypertension, hyperlipidemia, past history of smoking,                    coronary artery disease. Comparison Study:  NA Performing Technologist: Sharlett Iles RVT  Examination Guidelines: A complete evaluation includes B-mode imaging, spectral Doppler, color Doppler, and power Doppler as needed of all accessible portions of each vessel. Bilateral testing is considered an integral part of a complete examination. Limited examinations for reoccurring indications may be performed as noted.  Right Carotid Findings: +----------+-------+--------+--------+----------------+------------------------+             PSV     EDV cm/s Stenosis Plaque           Comments                              cm/s                      Description                                +----------+-------+--------+--------+----------------+------------------------+  CCA Prox   85      7                                                            +----------+-------+--------+--------+----------------+------------------------+  CCA Distal 56      6        <50%     calcific                                   +----------+-------+--------+--------+----------------+------------------------+  ICA Prox   253     60       40-59%   calcific         shadowing; high end                                                              range                     +----------+-------+--------+--------+----------------+------------------------+  ICA Mid    84      19                                                           +----------+-------+--------+--------+----------------+------------------------+  ICA Distal 29      12                                                            +----------+-------+--------+--------+----------------+------------------------+  ECA        277     24       >50%     calcific                                   +----------+-------+--------+--------+----------------+------------------------+ +----------+--------+-------+----------------+-------------------+             PSV cm/s EDV cms Describe         Arm Pressure (mmHG)  +----------+--------+-------+----------------+-------------------+  Subclavian 165              Multiphasic, WNL 136                  +----------+--------+-------+----------------+-------------------+ +---------+--------+--+--------+-+---------+  Vertebral PSV cm/s 27 EDV cm/s 6 Antegrade  +---------+--------+--+--------+-+---------+  Left Carotid Findings: +----------+--------+--------+--------+------------------+--------+             PSV cm/s EDV cm/s Stenosis Plaque Description Comments  +----------+--------+--------+--------+------------------+--------+  CCA Prox   115      15                                   tortuous  +----------+--------+--------+--------+------------------+--------+  CCA Distal 95       21                                             +----------+--------+--------+--------+------------------+--------+  ICA Prox   143      27       1-39%    heterogenous                 +----------+--------+--------+--------+------------------+--------+  ICA Mid    117      27                                             +----------+--------+--------+--------+------------------+--------+  ICA Distal 79       27                                             +----------+--------+--------+--------+------------------+--------+  ECA        309      18       >50%     heterogenous                 +----------+--------+--------+--------+------------------+--------+ +----------+--------+--------+----------------+-------------------+             PSV cm/s EDV cm/s Describe         Arm Pressure (mmHG)   +----------+--------+--------+----------------+-------------------+  Subclavian 208               Multiphasic, WNL 138                  +----------+--------+--------+----------------+-------------------+ +---------+--------+--+--------+--+---------+  Vertebral PSV cm/s 63 EDV cm/s 11 Antegrade  +---------+--------+--+--------+--+---------+   Summary: Right Carotid: Velocities in the right ICA are consistent with a 40-59%                stenosis. Non-hemodynamically significant plaque <50% noted in                the CCA. The ECA appears >50% stenosed. Left Carotid: Velocities in the left ICA are consistent with a 1-39% stenosis.               The ECA appears >50% stenosed. Vertebrals:  Bilateral vertebral arteries demonstrate antegrade flow. Subclavians: Normal flow hemodynamics were seen in bilateral subclavian              arteries. *See table(s) above for measurements and observations. Suggest follow up study in 12 months. Electronically signed by Quay Burow MD on 12/10/2021 at 6:19:41 PM.    Final     Physical Exam: BP (!) 150/70    Pulse 67    Ht 5\' 10"  (1.778 m)    Wt 238 lb (108 kg)    SpO2 99%    BMI 34.15 kg/m  Constitutional: Pleasant,well-developed, obese African-American male in no acute distress. HEENT: Normocephalic and atraumatic. Conjunctivae are normal. No scleral icterus. Neck supple.  Cardiovascular: Normal rate, regular rhythm.  Pulmonary/chest: Effort normal and breath sounds normal. No wheezing, rales or rhonchi. Abdominal: Soft, nondistended, nontender. Bowel sounds active throughout. There are no masses palpable. No hepatomegaly. Extremities: Trace bilateral pitting edema Neurological: Alert and oriented to person place and time.  No asterixis Skin: Skin is warm and dry. No rashes noted. Psychiatric: Normal mood and affect. Behavior is normal.  CBC    Component Value Date/Time   WBC 5.7 08/09/2021 0303   RBC 4.71 08/09/2021 0303   HGB 14.0 08/09/2021 0303   HGB  CANCELED 03/21/2019 1659   HCT 42.6 08/09/2021 0303   HCT CANCELED 03/21/2019 1659   PLT 104 (L) 08/09/2021 0303   PLT CANCELED 03/21/2019 1659   MCV 90.4 08/09/2021 0303   MCV 89 01/24/2019 1522   MCH 29.7 08/09/2021 0303   MCHC 32.9 08/09/2021 0303   RDW 12.6 08/09/2021 0303   RDW 12.7 01/24/2019 1522   LYMPHSABS 2.7 08/09/2021 0303   LYMPHSABS CANCELED 03/21/2019 1659   MONOABS 0.5 08/09/2021 0303   EOSABS 0.1 08/09/2021 0303   EOSABS CANCELED 03/21/2019 1659   BASOSABS 0.0 08/09/2021 0303   BASOSABS CANCELED 03/21/2019 1659    CMP     Component Value Date/Time   NA 136 12/03/2021 1407   K 5.0 12/03/2021 1407   CL 103 12/03/2021 1407   CO2 21 12/03/2021 1407   GLUCOSE 91 12/03/2021 1407   GLUCOSE 134 (H) 08/09/2021 0303   BUN 23 12/03/2021 1407   CREATININE 1.11 12/03/2021 1407   CREATININE 0.94 12/02/2015 2001   CALCIUM 9.0 12/03/2021 1407   PROT 8.2 12/03/2021 1407   ALBUMIN 4.3 12/03/2021 1407   AST 20 12/03/2021 1407   ALT 26 12/03/2021 1407   ALKPHOS 79 12/03/2021 1407   BILITOT 0.5 12/03/2021 1407   GFRNONAA >60 08/09/2021 0303   GFRAA 83 01/06/2021 1401     ASSESSMENT AND PLAN: 76 year old male with history of heavy alcohol use, now abstinent for over a year with a CT evidence suggestive of cirrhosis without ascites, and with labs suggestive of portal hypertension based on mild thrombocytopenia.  His liver enzymes currently look excellent, with normal ALT/AST and  normal bilirubin.  No recent INR.  He has recently gained 5 pounds in the past month and does complain of lower extremity swelling, and had a echocardiogram showing mild diastolic dysfunction, otherwise normal.  I have low suspicion that his weight gain is due to ascites or worsening portal hypertension/fluid retention given that his liver enzymes look so good and he has been abstinent for over a year.  We discussed the significance of cirrhosis of the liver, and the various complications that can occur  in the setting of cirrhosis.  I applauded his ability to stop drinking alcohol and maintain sobriety and told him that this was the most important thing he can do for his liver.  I told him that it is unlikely that his liver would be able to get back to normal even with abstinence, but that his disease progression would be markedly slowed with his abstinence.  We discussed the role of variceal screening and cirrhosis, and the need for colon cancer screening.  Given his cardiopulmonary comorbidities and stable liver disease, it was unclear that the benefits of variceal screening outweighed the potential risk, especially in the setting of potentially symptomatic carotid stenosis.  We elected to hold off on variceal screening at this point.  The patient was okay with proceeding with Cologuard for colon cancer screening, and we would again weigh risk and benefits of colonoscopy if the Cologuard were positive.  Even though his cirrhosis is almost certainly secondary to chronic alcohol use, we need to also exclude other potential concomitant causes of liver disease to include viral, autoimmune and genetic.  We also discussed the need for liver cancer screening every 6 months.  He was noted to have a nonspecific abnormality in the dome of the liver in June.  We will follow this up with an MRI.  Patient denies having any metal in his body, although he does have a history of shrapnel injury but he states this was all removed.  He also has sternotomy wires from a CABG.  If this is prohibitive from an MRI, we will get a triphase CT scan of the liver. We discussed what hepatic encephalopathy is, and how minimal encephalopathy can present with subtle symptoms such as sleep-wake cycle disruption.  We discussed starting lactulose now to see if this may improve his symptoms.  The patient preferred to wait until after his sleep study is complete before starting lactulose.  Cirrhosis, presumed alcohol, compensated - INR to  calculate MELD - MRI to screen for [facility] and follow-up on abnormality seen on CT and June - Rule out other causes of chronic liver disease (autoimmune, viral, genetic) - AFP - Hold off on variceal screening EGD for now, will readdress at a later visit - Reassess need for lactulose for minimal encephalopathy following sleep study results - Continue complete abstinence from alcohol - Patient advised to avoid NSAIDs - Given suspected portal hypertension (thrombocytopenia), patient recommended follow low sodium diet  Colon cancer screening - Cologuard  Follow-up 2 to 3 months  Kedra Mcglade E. Candis Schatz, MD [facility]   CC:  Mitchel Honour [place]

## 2022-01-02 LAB — MITOCHONDRIAL ANTIBODIES: Mitochondrial M2 Ab, IgG: <=20 U (ref <=20.0)

## 2022-01-02 LAB — HEPATITIS B SURFACE ANTIBODY,QUALITATIVE: Hep B S Ab: NONREACTIVE

## 2022-01-02 LAB — ALPHA-1-ANTITRYPSIN: A-1 Antitrypsin, Ser: 185 mg/dL (ref 83–199)

## 2022-01-02 LAB — HEPATITIS B SURFACE ANTIGEN: Hepatitis B Surface Ag: NONREACTIVE

## 2022-01-02 LAB — AFP TUMOR MARKER: AFP-Tumor Marker: 3.3 ng/mL (ref <6.1)

## 2022-01-02 LAB — HCV RNA,QUANTITATIVE REAL TIME PCR
HCV Quantitative Log: 5.72 Log IU/mL — ABNORMAL HIGH
HCV RNA, PCR, QN: 525000 IU/mL — ABNORMAL HIGH

## 2022-01-02 LAB — ANTI-SMOOTH MUSCLE ANTIBODY, IGG: Actin (Smooth Muscle) Antibody (IGG): <20 U (ref <20)

## 2022-01-02 LAB — HEPATITIS A ANTIBODY, TOTAL: Hepatitis A AB,Total: NONREACTIVE

## 2022-01-02 LAB — CERULOPLASMIN: Ceruloplasmin: 30 mg/dL (ref 18–36)

## 2022-01-02 LAB — HEPATITIS C ANTIBODY
Hepatitis C Ab: REACTIVE — AB
SIGNAL TO CUT-OFF: >11 — ABNORMAL HIGH (ref <1.00)

## 2022-01-04 ENCOUNTER — Telehealth: Payer: Self-pay | Admitting: Gastroenterology

## 2022-01-04 ENCOUNTER — Other Ambulatory Visit: Payer: Self-pay

## 2022-01-04 DIAGNOSIS — Z1211 Encounter for screening for malignant neoplasm of colon: Secondary | ICD-10-CM

## 2022-01-04 NOTE — Telephone Encounter (Signed)
Called [employer] and corrected code to Z12.12 for Colon cancer screening.

## 2022-01-04 NOTE — Telephone Encounter (Signed)
Inbound call from Tammy at [employer], states the code K74.60 may not cover the cologuard test for patient. States it would have to be Z12.11 or Z12.12. Best contact number [phone] [id number]

## 2022-01-06 ENCOUNTER — Other Ambulatory Visit: Payer: Self-pay | Admitting: Gastroenterology

## 2022-01-06 ENCOUNTER — Ambulatory Visit (HOSPITAL_COMMUNITY)
Admission: RE | Admit: 2022-01-06 | Discharge: 2022-01-06 | Disposition: A | Discharge status: Home / Self Care | Payer: Medicare Other | Source: Ambulatory Visit | Attending: Gastroenterology | Admitting: Gastroenterology

## 2022-01-06 ENCOUNTER — Other Ambulatory Visit: Payer: Self-pay

## 2022-01-06 DIAGNOSIS — N281 Cyst of kidney, acquired: Secondary | ICD-10-CM | POA: Diagnosis not present

## 2022-01-06 DIAGNOSIS — K7689 Other specified diseases of liver: Secondary | ICD-10-CM | POA: Diagnosis not present

## 2022-01-06 DIAGNOSIS — K7460 Unspecified cirrhosis of liver: Secondary | ICD-10-CM | POA: Diagnosis not present

## 2022-01-06 DIAGNOSIS — R161 Splenomegaly, not elsewhere classified: Secondary | ICD-10-CM | POA: Diagnosis not present

## 2022-01-07 ENCOUNTER — Other Ambulatory Visit: Payer: Self-pay

## 2022-01-07 DIAGNOSIS — K7460 Unspecified cirrhosis of liver: Secondary | ICD-10-CM

## 2022-01-07 DIAGNOSIS — B1920 Unspecified viral hepatitis C without hepatic coma: Secondary | ICD-10-CM

## 2022-01-07 NOTE — Progress Notes (Signed)
Mr. Flanigan,  Your blood tests revealed that you have hepatitis C virus.  This virus is typically acquired through blood contact (blood transfusions prior to the 1990s, IV drug use or through sexual activity).  You have probably had it for a long time, and it has likely contributed to your cirrhosis in addition to the alcohol. Similarly, it can be spread from you to other people through these means. Hepatitis C can be readily cured with currently available medications.  Our Infectious Disease colleagues typically treat the Hepatitis C patients in our system.  I think it would be reasonable to meet with them and discuss treatment options.  First, we need to get the genotype of your virus, which involves another blood test, prior to treatment.   Bonita Quin,  Can you please order an HCV genotype test, and place a referral to ID for treatment of HCV with compensated cirrhosis?

## 2022-01-19 ENCOUNTER — Ambulatory Visit (INDEPENDENT_AMBULATORY_CARE_PROVIDER_SITE_OTHER): Payer: Medicare Other | Admitting: Neurology

## 2022-01-19 ENCOUNTER — Encounter: Payer: Self-pay | Admitting: Neurology

## 2022-01-19 VITALS — BP 142/72 | HR 72 | Ht 71.0 in | Wt 243.0 lb

## 2022-01-19 DIAGNOSIS — R0689 Other abnormalities of breathing: Secondary | ICD-10-CM | POA: Diagnosis not present

## 2022-01-19 DIAGNOSIS — E66811 Obesity, class 1: Secondary | ICD-10-CM

## 2022-01-19 DIAGNOSIS — R0683 Snoring: Secondary | ICD-10-CM

## 2022-01-19 DIAGNOSIS — E669 Obesity, unspecified: Secondary | ICD-10-CM

## 2022-01-19 DIAGNOSIS — Z951 Presence of aortocoronary bypass graft: Secondary | ICD-10-CM | POA: Diagnosis not present

## 2022-01-19 DIAGNOSIS — R519 Headache, unspecified: Secondary | ICD-10-CM

## 2022-01-19 DIAGNOSIS — R351 Nocturia: Secondary | ICD-10-CM

## 2022-01-19 DIAGNOSIS — G4719 Other hypersomnia: Secondary | ICD-10-CM | POA: Diagnosis not present

## 2022-01-19 DIAGNOSIS — R635 Abnormal weight gain: Secondary | ICD-10-CM

## 2022-01-19 NOTE — Patient Instructions (Signed)

## 2022-01-19 NOTE — Progress Notes (Signed)
Subjective:    Patient ID: [name] is a 76 y.o. male.  HPI    Huston Foley, MD, PhD [facility] [address] [address] [place], [place] [address]  Dear Dr. Alvy Bimler,  I saw your patient, [name], upon your kind request in my sleep clinic today for initial consultation of his sleep disorder, in particular, concern for underlying obstructive sleep apnea.  The patient is unaccompanied today.  As you know, [name] is a 76 year old right-handed gentleman with an underlying medical history of aortic atherosclerosis, carotid artery stenosis, chronic diastolic congestive heart failure, alcohol use disorder, liver cirrhosis, thrombocytopenia, coronary artery disease with status post three-vessel bypass in 2006, chronic hepatitis C, hypertension, hyperlipidemia, anxiety, arthritis, low back pain, and obesity, who reports snoring and excessive daytime somnolence.  I reviewed your office note from 12/09/2021.  His Epworth sleepiness score is 19 out of 24, fatigue severity score is 40 out of 63.  He is divorced and lives alone.  He has older children from previous marriage and has younger children from his recent marriage, his daughter is 52 and his son is 8.  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  He reports a variable sleep schedule, generally in bed between 9 and 11 PM and rise time varies. His sleep is interrupted, he sleeps about 3-4 hours at a time.  He has nocturia about 2-3 times per average night and has had some morning headaches.  His sister has sleep apnea but he is not sure if she actually has a CPAP machine.  He quit smoking over 50 years ago.  He drinks caffeine in the form of tea, 1 or 2 glasses/day on average.  He has not had any alcohol for over 1 year.  His Past Medical History Is Significant For: Past Medical History:  Diagnosis Date   Anginal pain (HCC) 01/2005   Anxiety    Arthritis    "knees"   Chronic lower back pain    Colon  polyp    Coronary artery disease    Depression    Hepatitis    "don't know what kind; they treated me for it; was a long time ago" (12/11/2018)   High cholesterol    History of bronchitis    "used to get it alot" (08/25/2012)   Hypertension    Panic attacks    PTSD (post-traumatic stress disorder)    "have been treated in the past" (08/25/2012)    His Past Surgical History Is Significant For: Past Surgical History:  Procedure Laterality Date   CARDIAC CATHETERIZATION  01/2005   CORONARY ARTERY BYPASS GRAFT  01/2005   CABG X3   Shrapnel Left 1969   LLE; left lateral thumb (required grafting)   VARICOSE VEIN SURGERY  1980's   LLE    His Family History Is Significant For: Family History  Problem Relation Age of Onset   Diabetes Mother    Heart disease Father    Sleep apnea Sister    Cancer Brother    Colon cancer Neg Hx    Liver cancer Neg Hx    Pancreatic cancer Neg Hx    Esophageal cancer Neg Hx    Stomach cancer Neg Hx     His Social History Is Significant For: Social History   Socioeconomic History   Marital status: Divorced    Spouse name: Not on file   Number of children: Not on file   Years of education: Not on file  Highest education level: Not on file  Occupational History   Not on file  Tobacco Use   Smoking status: Former    Packs/day: 2.00    Years: 5.00    Pack years: 10.00    Types: Cigarettes   Smokeless tobacco: Never   Tobacco comments:    08/25/2012 "quit smoking cigarettes 40 yr ago"  Vaping Use   Vaping Use: Never used  Substance and Sexual Activity   Alcohol use: Yes    Alcohol/week: 60.0 standard drinks    Types: 60 Standard drinks or equivalent per week    Comment: 12/11/2018 "~ 1 pint of vodka/day"   Drug use: Yes    Types: Marijuana    Comment: 08/25/2012 "last marijuana 10 years ago"   Sexual activity: Yes  Other Topics Concern   Not on file  Social History Narrative   Not on file   Social Determinants of Health   Financial  Resource Strain: Not on file  Food Insecurity: Not on file  Transportation Needs: Not on file  Physical Activity: Not on file  Stress: Not on file  Social Connections: Not on file    His Allergies Are:  Allergies  Allergen Reactions   Codeine Palpitations   Vicodin [Hydrocodone-Acetaminophen] Nausea And Vomiting, Swelling and Palpitations  :   His Current Medications Are:  Outpatient Encounter Medications as of 01/19/2022  Medication Sig   escitalopram (LEXAPRO) 5 MG tablet Take 5 mg by mouth daily.   Evolocumab (REPATHA SURECLICK) 140 MG/ML SOAJ Inject 140 mg into the skin every 14 (fourteen) days.   indapamide (LOZOL) 1.25 MG tablet Take 1 tablet (1.25 mg total) by mouth daily.   irbesartan (AVAPRO) 300 MG tablet Take 300 mg by mouth daily.   ofloxacin (OCUFLOX) 0.3 % ophthalmic solution Place into the left eye.   prednisoLONE acetate (PRED FORTE) 1 % ophthalmic suspension Place into the left eye.   sildenafil (VIAGRA) 100 MG tablet Take 100 mg by mouth daily as needed.   spironolactone (ALDACTONE) 25 MG tablet Take 1 tablet (25 mg total) by mouth daily.   No facility-administered encounter medications on file as of 01/19/2022.  :   Review of Systems:  Out of a complete 14 point review of systems, all are reviewed and negative with the exception of these symptoms as listed below:   Review of Systems  Neurological:        Pt is here for sleep consult . Pt states he sleeping pattern is off . Pt states he wakes every hour during the night. Pt states he has headaches,fatigue , and Hypertension. Pt denies Sleep study,CPAP, and snoring   ESS:19 FSS:40   Objective:  Neurological Exam  Physical Exam Physical Examination:   Vitals:   01/19/22 1529  BP: (!) 142/72  Pulse: 72    General Examination: The patient is a very pleasant 76 y.o. male in no acute distress. He appears well-developed and well-nourished and well groomed.   HEENT: Normocephalic, atraumatic, pupils are  equal, round and reactive to light, extraocular tracking is good without limitation to gaze excursion or nystagmus noted. Hearing is grossly intact. Face is symmetric with normal facial animation. Speech is clear with no dysarthria noted. There is no hypophonia. There is no lip, neck/head, jaw or voice tremor. Neck is supple with full range of passive and active motion. There are no carotid bruits on auscultation. Oropharynx exam reveals: mild mouth dryness, adequate dental hygiene with dentures on top, partial dentures on the bottom,  moderate airway crowding secondary to the redundant soft palate, tonsillar size of about 1+, thicker soft palate, prominent uvula, wider tongue.  Tongue protrudes centrally and palate elevates symmetrically.  Neck circumference of 17 inches.    Chest: Clear to auscultation without wheezing, rhonchi or crackles noted.  Heart: S1+S2+0, regular and normal without murmurs, rubs or gallops noted.   Abdomen: Soft, non-tender and non-distended.  Extremities: There is no pitting edema in the distal lower extremities bilaterally.   Skin: Warm and dry without trophic changes noted.   Musculoskeletal: exam reveals no obvious joint deformities, tenderness or joint swelling or erythema.   Neurologically:  Mental status: The patient is awake, alert and oriented in all 4 spheres. His immediate and remote memory, attention, language skills and fund of knowledge are appropriate. There is no evidence of aphasia, agnosia, apraxia or anomia. Speech is clear with normal prosody and enunciation. Thought process is linear. Mood is normal and affect is normal.  Cranial nerves II - XII are as described above under HEENT exam.  Motor exam: Normal bulk, strength and tone is noted. There is no tremor. Fine motor skills and coordination: grossly intact.  Cerebellar testing: No dysmetria or intention tremor. There is no truncal or gait ataxia.  Sensory exam: intact to light touch in the upper and  lower extremities.  Gait, station and balance: He stands easily. No veering to one side is noted. No leaning to one side is noted. Posture is age-appropriate and stance is narrow based. Gait shows normal stride length and normal pace. No problems turning are noted.   Assessment and Plan:  In summary, RANDON SOMERA is a very pleasant 76 y.o.-year old male with an underlying medical history of aortic atherosclerosis, carotid artery stenosis, chronic diastolic congestive heart failure, alcohol use disorder, liver cirrhosis, thrombocytopenia, coronary artery disease with status post three-vessel bypass in 2006, chronic hepatitis C, hypertension, hyperlipidemia, anxiety, arthritis, low back pain, and obesity, whose history and physical exam are concerning for obstructive sleep apnea (OSA). I had a long chat with the patient about my findings and the diagnosis of OSA, its prognosis and treatment options. We talked about medical treatments, surgical interventions and non-pharmacological approaches. I explained in particular the risks and ramifications of untreated moderate to severe OSA, especially with respect to developing cardiovascular disease down the Road, including congestive heart failure, difficult to treat hypertension, cardiac arrhythmias, or stroke. Even type 2 diabetes has, in part, been linked to untreated OSA. Symptoms of untreated OSA include daytime sleepiness, memory problems, mood irritability and mood disorder such as depression and anxiety, lack of energy, as well as recurrent headaches, especially morning headaches. We talked about trying to maintain a healthy lifestyle in general, as well as the importance of weight control. We also talked about the importance of good sleep hygiene. I recommended the following at this time: sleep study.  I outlined the differences between a laboratory attended sleep study versus home sleep test. I explained the sleep test procedure to the patient and also  outlined possible surgical and non-surgical treatment options of OSA, including the use of a custom-made dental device (which would require a referral to a specialist dentist or oral surgeon), upper airway surgical options, such as traditional UPPP or a novel less invasive surgical option in the form of Inspire hypoglossal nerve stimulation (which would involve a referral to an ENT surgeon). I also explained the CPAP treatment option to the patient, who indicated that he would be willing to try  CPAP if the need arises. I explained the importance of being compliant with PAP treatment, not only for insurance purposes but primarily to improve His symptoms, and for the patient's long term health benefit, including to reduce His cardiovascular risks. I answered all his questions today and the patient was in agreement. I plan to see him back after the sleep study is completed and encouraged him to call with any interim questions, concerns, problems or updates.   Thank you very much for allowing me to participate in the care of this nice patient. If I can be of any further assistance to you please do not hesitate to call me at [phone].  Sincerely,   Huston FoleySaima Analyse Angst, MD, PhD

## 2022-01-26 ENCOUNTER — Encounter: Payer: Self-pay | Admitting: Family

## 2022-01-26 ENCOUNTER — Ambulatory Visit (INDEPENDENT_AMBULATORY_CARE_PROVIDER_SITE_OTHER): Payer: Medicare Other | Admitting: Family

## 2022-01-26 ENCOUNTER — Other Ambulatory Visit (HOSPITAL_COMMUNITY): Payer: Self-pay

## 2022-01-26 ENCOUNTER — Other Ambulatory Visit: Payer: Self-pay

## 2022-01-26 ENCOUNTER — Telehealth: Payer: Self-pay

## 2022-01-26 VITALS — BP 151/75 | HR 66 | Temp 98.2°F | Resp 16 | Ht 71.0 in | Wt 242.8 lb

## 2022-01-26 DIAGNOSIS — K7030 Alcoholic cirrhosis of liver without ascites: Secondary | ICD-10-CM | POA: Diagnosis not present

## 2022-01-26 DIAGNOSIS — B182 Chronic viral hepatitis C: Secondary | ICD-10-CM | POA: Diagnosis not present

## 2022-01-26 DIAGNOSIS — Z0000 Encounter for general adult medical examination without abnormal findings: Secondary | ICD-10-CM | POA: Insufficient documentation

## 2022-01-26 NOTE — Assessment & Plan Note (Signed)
Appears to be compensated at present. Will recheck hepatic panel. MRI with cirrhosis and may need ultrasound imaging in the future. Continue management per Gastroenterology.

## 2022-01-26 NOTE — Assessment & Plan Note (Signed)
·   Hepatitis B negative and not immune. Discussed/recommended Hepatitis B vaccination. Declined at this time.

## 2022-01-26 NOTE — Patient Instructions (Signed)
Limit acetaminophen (Tylenol) usage to no more than 2 grams (2,000 mg) per day.  Avoid alcohol.  Do not share toothbrushes or razors.  Practice safe sex to protect against transmission as well as sexually transmitted disease.   Follow up 1 month after starting medication.    Hepatitis C Hepatitis C is a viral infection of the liver. It can lead to scarring of the liver (cirrhosis), liver failure, or liver cancer. Hepatitis C may go undetected for months or years because people with the infection may not have symptoms, or they may have only mild symptoms. What are the causes? This condition is caused by the hepatitis C virus (HCV). The virus can spread from person to person (is contagious) through: Blood. Childbirth. A woman who has hepatitis C can pass it to her baby during birth. Bodily fluids, such as breast milk, tears, semen, vaginal fluids, and saliva. Blood transfusions or organ transplants done in the [place] before 1992.  What increases the risk? The following factors may make you more likely to develop this condition: Having contact with unclean (contaminated) needles or syringes. This may result from: Acupuncture. Tattoing. Body piercing. Injecting drugs. Having unprotected sex with someone who is infected. Needing treatment to filter your blood (kidney dialysis). Having HIV (human immunodeficiency virus) or AIDS (acquired immunodeficiency syndrome). Working in a job that involves contact with blood or bodily fluids, such as health care.  What are the signs or symptoms? Symptoms of this condition include: Fatigue. Loss of appetite. Nausea. Vomiting. Abdominal pain. Dark yellow urine. Yellowish skin and eyes (jaundice). Itchy skin. Clay-colored bowel movements. Joint pain. Bleeding and bruising easily. Fluid building up in your stomach (ascites).  In some cases, you may not have any symptoms. How is this diagnosed? This condition is diagnosed  with: Blood tests. Other tests to check how well your liver is functioning. They may include: Magnetic resonance elastography (MRE). This imaging test uses MRIs and sound waves to measure liver stiffness. Transient elastography. This imaging test uses ultrasounds to measure liver stiffness. Liver biopsy. This test requires taking a small tissue sample from your liver to examine it under a microscope.  How is this treated? Your health care provider may perform noninvasive tests or a liver biopsy to help decide the best course of treatment. Treatment may include: Antiviral medicines and other medicines. Follow-up treatments every 6-12 months for infections or other liver conditions. Receiving a donated liver (liver transplant).  Follow these instructions at home: Medicines Take over-the-counter and prescription medicines only as told by your health care provider. Take your antiviral medicine as told by your health care provider. Do not stop taking the antiviral even if you start to feel better. Do not take any medicines unless approved by your health care provider, including over-the-counter medicines and birth control pills. Activity Rest as needed. Do not have sex unless approved by your health care provider. Ask your health care provider when you may return to school or work. Eating and drinking Eat a balanced diet with plenty of fruits and vegetables, whole grains, and lowfat (lean) meats or non-meat proteins (such as beans or tofu). Drink enough fluids to keep your urine clear or pale yellow. Do not drink alcohol. General instructions Do not share toothbrushes, nail clippers, or razors. Wash your hands frequently with soap and water. If soap and water are not available, use hand sanitizer. Cover any cuts or open sores on your skin to prevent spreading the virus. Keep all follow-up visits as told  by your health care provider. This is important. You may need follow-up visits every 6-12  months. How is this prevented? There is no vaccine for hepatitis C. The only way to prevent the disease is to reduce the risk of exposure to the virus. Make sure you: Wash your hands frequently with soap and water. If soap and water are not available, use hand sanitizer. Do not share needles or syringes. Practice safe sex and use condoms. Avoid handling blood or bodily fluids without gloves or other protection. Avoid getting tattoos or piercings in shops or other locations that are not clean.  Contact a health care provider if: You have a fever. You develop abdominal pain. You pass dark urine. You pass clay-colored stools. You develop joint pain. Get help right away if: You have increasing fatigue or weakness. You lose your appetite. You cannot eat or drink without vomiting. You develop jaundice or your jaundice gets worse. You bruise or bleed easily. Summary Hepatitis C is a viral infection of the liver. It can lead to scarring of the liver (cirrhosis), liver failure, or liver cancer. The hepatitis C virus (HCV) causes this condition. The virus can pass from person to person (is contagious). You should not take any medicines unless approved by your health care provider. This includes over-the-counter medicines and birth control pills. This information is not intended to replace advice given to you by your health care provider. Make sure you discuss any questions you have with your health care provider. Document Released: 11/12/2000 Document Revised: 12/21/2016 Document Reviewed: 12/21/2016 Elsevier Interactive Patient Education  [employer].

## 2022-01-26 NOTE — Progress Notes (Signed)
Subjective:    Patient ID: [name], male    DOB: Apr 08, 1946, 76 y.o.   MRN: [id number]  Chief Complaint  Patient presents with   New Patient (Initial Visit)    Hep c     HPI:  [name] is a 76 y.o. male with previous medical history as detailed below and significant for cirrhosis and hepatitis C presenting today for initial evaluation of hepatitis C.  [name] was initially diagnosed with hepatitis C in the late 1960s.  Risk factor for hepatitis C is age being born between [birth year] and 1964 as well as [place] a blood [place].  Currently with cirrhosis and no family history of liver disease.  No current symptoms and denies abdominal pain, nausea, vomiting, diarrhea, scleral icterus, or jaundice.  He does have generalized fatigue. Has not received treatment to date.  No current alcohol consumption, with marijuana use and a former tobacco smoker.  Previous blood work completed through gastroenterology with hepatitis C RNA level of 525,000.  Liver MRI with cirrhosis.   Allergies  Allergen Reactions   Codeine Palpitations   Vicodin [Hydrocodone-Acetaminophen] Nausea And Vomiting, Swelling and Palpitations      Outpatient Medications Prior to Visit  Medication Sig Dispense Refill   escitalopram (LEXAPRO) 5 MG tablet Take 5 mg by mouth daily.     Evolocumab (REPATHA SURECLICK) 140 MG/ML SOAJ Inject 140 mg into the skin every 14 (fourteen) days. 2 mL 11   indapamide (LOZOL) 1.25 MG tablet Take 1 tablet (1.25 mg total) by mouth daily. 90 tablet 3   irbesartan (AVAPRO) 300 MG tablet Take 300 mg by mouth daily.     ofloxacin (OCUFLOX) 0.3 % ophthalmic solution Place into the left eye.     prednisoLONE acetate (PRED FORTE) 1 % ophthalmic suspension Place into the left eye.     sildenafil (VIAGRA) 100 MG tablet Take 100 mg by mouth daily as needed.     spironolactone (ALDACTONE) 25 MG tablet Take 1 tablet (25 mg total) by mouth daily. 30 tablet 5   No  facility-administered medications prior to visit.     Past Medical History:  Diagnosis Date   Anginal pain (HCC) 01/2005   Anxiety    Arthritis    "knees"   Chronic lower back pain    Colon polyp    Coronary artery disease    Depression    Hepatitis    "don't know what kind; they treated me for it; was a long time ago" (12/11/2018)   High cholesterol    History of bronchitis    "used to get it alot" (08/25/2012)   Hypertension    Panic attacks    PTSD (post-traumatic stress disorder)    "have been treated in the past" (08/25/2012)     Past Surgical History:  Procedure Laterality Date   CARDIAC CATHETERIZATION  01/2005   CORONARY ARTERY BYPASS GRAFT  01/2005   CABG X3   Shrapnel Left 1969   LLE; left lateral thumb (required grafting)   VARICOSE VEIN SURGERY  1980's   LLE       Review of Systems  Constitutional:  Negative for chills, diaphoresis, fatigue and fever.  Respiratory:  Negative for cough, chest tightness, shortness of breath and wheezing.   Cardiovascular:  Negative for chest pain.  Gastrointestinal:  Negative for abdominal distention, abdominal pain, constipation, diarrhea, nausea and vomiting.  Neurological:  Negative for weakness and headaches.  Hematological:  Does not bruise/bleed easily.  Objective:    BP (!) 151/75    Pulse 66    Temp 98.2 F (36.8 C) (Temporal)    Resp 16    Ht 5\' 11"  (1.803 m)    Wt 242 lb 12.8 oz (110.1 kg)    SpO2 99%    BMI 33.86 kg/m  Nursing note and vital signs reviewed.  Physical Exam Constitutional:      General: He is not in acute distress.    Appearance: He is well-developed.  Cardiovascular:     Rate and Rhythm: Normal rate and regular rhythm.     Heart sounds: Normal heart sounds. No murmur heard.   No friction rub. No gallop.  Pulmonary:     Effort: Pulmonary effort is normal. No respiratory distress.     Breath sounds: Normal breath sounds. No wheezing or rales.  Chest:     Chest wall: No tenderness.   Abdominal:     General: Bowel sounds are normal. There is no distension.     Palpations: Abdomen is soft. There is no mass.     Tenderness: There is no abdominal tenderness. There is no guarding or rebound.  Skin:    General: Skin is warm and dry.  Neurological:     Mental Status: He is alert and oriented to person, place, and time.  Psychiatric:        Behavior: Behavior normal.        Thought Content: Thought content normal.        Judgment: Judgment normal.     Depression screen [facility] 2/9 01/26/2022 12/09/2021 12/11/2020 08/11/2020 05/29/2020  Decreased Interest 0 0 2 3 0  Down, Depressed, Hopeless 0 0 2 2 0  PHQ - 2 Score 0 0 4 5 0  Altered sleeping - - 2 3 0  Tired, decreased energy - - 1 2 0  Change in appetite - - 1 3 0  Feeling bad or failure about yourself  - - 2 2 0  Trouble concentrating - - 2 3 0  Moving slowly or fidgety/restless - - 2 2 0  Suicidal thoughts - - 0 0 -  PHQ-9 Score - - 14 20 0  Difficult doing work/chores - - Somewhat difficult - -  Some recent data might be hidden       Assessment & Plan:    Patient Active Problem List   Diagnosis Date Noted   Chronic hepatitis C without hepatic coma (HCC) 01/26/2022   Healthcare maintenance 01/26/2022   Atherosclerosis of aorta (HCC) 06/16/2021   Hypertensive heart disease with chronic diastolic congestive heart failure (HCC) 06/16/2021   Alcoholic cirrhosis of liver without ascites (HCC) 06/16/2021   Calculus of gallbladder without cholecystitis without obstruction 06/16/2021   Chronic diastolic heart failure (HCC) 12/11/2020   Alcohol abuse with alcohol-induced mood disorder (HCC) 06/16/2019   PTSD (post-traumatic stress disorder) 06/16/2019   Thrombocytopenia (HCC) 02/24/2019   Chronic alcohol abuse 12/09/2015   Coronary artery disease involving coronary bypass graft of native heart without angina pectoris 08/07/2014   Essential hypertension 08/07/2014   Dyslipidemia 08/07/2014     Problem List Items  Addressed This Visit       Digestive   Alcoholic cirrhosis of liver without ascites (HCC)    Appears to be compensated at present. Will recheck hepatic panel. MRI with cirrhosis and may need ultrasound imaging in the future. Continue management per Gastroenterology.       Chronic hepatitis C without hepatic coma (HCC) - Primary  [name] is a 76 y/o male with chronic hepatitis C with risk factor of age and previous blood transfusion. Complicated by cirrhosis which appears to be compensated. Asymptomatic and treatment naive. We reviewed the pathogenesis, transmission, risks if left untreated, treatment and financials associated with Hepatitis C. Check lab work today for HIV and Hepatitis C. Previous Hepatitis B negative and not immune. Plan for treatment with Mavyret or Epclusa pending lab work results. Follow up in 1 month after start of medication.       Relevant Orders   Hepatitis C genotype   HIV Antibody (routine testing w rflx)   Hepatic function panel     Other   Healthcare maintenance    Hepatitis B negative and not immune. Discussed/recommended Hepatitis B vaccination. Declined at this time.         I am having Judie Bonus. Widmann maintain his Repatha SureClick, sildenafil, spironolactone, escitalopram, indapamide, irbesartan, ofloxacin, and prednisoLONE acetate.    Follow-up: Pending lab work and 1 month after start of medication.    Marcos Eke, MSN, FNP-C Nurse Practitioner [facility] for [facility] Disease [facility] Main number: [phone]

## 2022-01-26 NOTE — Telephone Encounter (Signed)
[facility] Patient Advocate Encounter  Insurance verification completed.    The patient is insured through [employer].  Medication will need a PA.  We will continue to follow to see if copay assistance is needed.  Connie Hilgert, CPhT Specialty Pharmacy Patient Advocate [facility] Phone: [phone] Fax:  [phone]  

## 2022-01-26 NOTE — Assessment & Plan Note (Signed)
Mr. Justiss is a 76 y/o male with chronic hepatitis C with risk factor of age and previous blood transfusion. Complicated by cirrhosis which appears to be compensated. Asymptomatic and treatment naive. We reviewed the pathogenesis, transmission, risks if left untreated, treatment and financials associated with Hepatitis C. Check lab work today for HIV and Hepatitis C. Previous Hepatitis B negative and not immune. Plan for treatment with Mavyret or Epclusa pending lab work results. Follow up in 1 month after start of medication.

## 2022-01-27 ENCOUNTER — Other Ambulatory Visit: Payer: Self-pay

## 2022-01-27 ENCOUNTER — Ambulatory Visit (INDEPENDENT_AMBULATORY_CARE_PROVIDER_SITE_OTHER): Payer: Medicare Other | Admitting: Neurology

## 2022-01-27 DIAGNOSIS — Z951 Presence of aortocoronary bypass graft: Secondary | ICD-10-CM

## 2022-01-27 DIAGNOSIS — R0689 Other abnormalities of breathing: Secondary | ICD-10-CM

## 2022-01-27 DIAGNOSIS — R0683 Snoring: Secondary | ICD-10-CM

## 2022-01-27 DIAGNOSIS — R635 Abnormal weight gain: Secondary | ICD-10-CM

## 2022-01-27 DIAGNOSIS — G4733 Obstructive sleep apnea (adult) (pediatric): Secondary | ICD-10-CM

## 2022-01-27 DIAGNOSIS — G4761 Periodic limb movement disorder: Secondary | ICD-10-CM

## 2022-01-27 DIAGNOSIS — G4719 Other hypersomnia: Secondary | ICD-10-CM

## 2022-01-27 DIAGNOSIS — R351 Nocturia: Secondary | ICD-10-CM

## 2022-01-27 DIAGNOSIS — E66811 Obesity, class 1: Secondary | ICD-10-CM

## 2022-01-27 DIAGNOSIS — R519 Headache, unspecified: Secondary | ICD-10-CM

## 2022-01-27 DIAGNOSIS — B182 Chronic viral hepatitis C: Secondary | ICD-10-CM

## 2022-01-27 DIAGNOSIS — E669 Obesity, unspecified: Secondary | ICD-10-CM

## 2022-01-27 DIAGNOSIS — G4720 Circadian rhythm sleep disorder, unspecified type: Secondary | ICD-10-CM

## 2022-01-28 ENCOUNTER — Other Ambulatory Visit: Payer: Self-pay

## 2022-01-28 ENCOUNTER — Other Ambulatory Visit: Payer: Medicare Other

## 2022-01-28 DIAGNOSIS — B182 Chronic viral hepatitis C: Secondary | ICD-10-CM

## 2022-02-02 ENCOUNTER — Telehealth: Payer: Self-pay | Admitting: *Deleted

## 2022-02-02 ENCOUNTER — Telehealth: Payer: Self-pay | Admitting: Family

## 2022-02-02 ENCOUNTER — Other Ambulatory Visit (HOSPITAL_COMMUNITY): Payer: Self-pay

## 2022-02-02 LAB — HEPATIC FUNCTION PANEL
AG Ratio: 1 (calc) (ref 1.0–2.5)
ALT: 48 U/L — ABNORMAL HIGH (ref 9–46)
AST: 31 U/L (ref 10–35)
Albumin: 4.3 g/dL (ref 3.6–5.1)
Alkaline phosphatase (APISO): 58 U/L (ref 35–144)
Bilirubin, Direct: 0.2 mg/dL (ref 0.0–0.2)
Globulin: 4.2 g/dL (calc) — ABNORMAL HIGH (ref 1.9–3.7)
Indirect Bilirubin: 0.4 mg/dL (calc) (ref 0.2–1.2)
Total Bilirubin: 0.6 mg/dL (ref 0.2–1.2)
Total Protein: 8.5 g/dL — ABNORMAL HIGH (ref 6.1–8.1)

## 2022-02-02 LAB — HIV ANTIBODY (ROUTINE TESTING W REFLEX): HIV 1&2 Ab, 4th Generation: NONREACTIVE

## 2022-02-02 LAB — HEPATITIS C GENOTYPE

## 2022-02-02 NOTE — Telephone Encounter (Signed)
Spoke with the patient and discussed his sleep study results.  Pt aware overall his sleep study showed he has mild OSA. Dr Frances Furbish recommends treatment with an autoPAP. We were able to make it through most of the conversation but towards the end when I was trying to ask which DME company he would like to use, he said for [place] to call him back because he is in the middle of a sale.  ? ?Will try to call pt again tomorrow.  ?

## 2022-02-02 NOTE — Procedures (Signed)
PATIENT'S NAME:  [name], [name] ?DOB:      15-Feb-1946      ?MR#:    [id number]     ?DATE OF RECORDING: 01/27/2022 ?REFERRING M.D.:  [facility], MD ?Study Performed:   Baseline Polysomnogram ?HISTORY: 76 year old man with a history of aortic atherosclerosis, carotid artery stenosis, chronic diastolic CHF, prior alcohol use disorder, liver cirrhosis, thrombocytopenia, CAD with s/p CABG, chronic hepatitis C, hypertension, hyperlipidemia, anxiety, arthritis, low back pain, and obesity, who reports snoring and excessive daytime somnolence. The patient endorsed the Epworth Sleepiness Scale at 19 points. The patient's weight 243 pounds with a height of 71 (inches), resulting in a BMI of 34. kg/m2. The patient's neck circumference measured 17 inches. ? ?CURRENT MEDICATIONS: Lexapro, Repatha, Lozol, Avapro, Ocuflox, Pred Forte, Viagra, Aldactone ?  ?PROCEDURE:  This is a multichannel digital polysomnogram utilizing the Somnostar 11.2 system.  Electrodes and sensors were applied and monitored per AASM Specifications.   EEG, EOG, Chin and Limb EMG, were sampled at 200 Hz.  ECG, Snore and Nasal Pressure, Thermal Airflow, Respiratory Effort, CPAP Flow and Pressure, Oximetry was sampled at 50 Hz. Digital video and audio were recorded.     ? ?BASELINE STUDY ? ?Lights Out was at 21:50 and Lights On at 03:58.  Total recording time (TRT) was 368 minutes, with a total sleep time (TST) of 230 minutes.   The patient's sleep latency was 1.5 minutes.  REM latency was 56.5 minutes. The sleep efficiency was 62.5%, which is reduced.  ?   ?SLEEP ARCHITECTURE: WASO (Wake after sleep onset) was 134.5 minutes with longer periods of wakefulness in the second 2/3 of the night and moderate sleep fragmentation noted. There were 15.5 minutes in Stage N1, 170.5 minutes Stage N2, 12 minutes Stage N3 and 32 minutes in Stage REM.  The percentage of Stage N1 was 6.7%, Stage N2 was 74.1%, which is markedly increased, Stage N3 was 5.2% and Stage R  (REM sleep) was 13.9%, which is reduced. The arousals were noted as: 47 were spontaneous, 3 were associated with PLMs, 6 were associated with respiratory events. ? ?RESPIRATORY ANALYSIS:  There were a total of 33 respiratory events:  19 obstructive apneas, 0 central apneas and 3 mixed apneas with a total of 22 apneas and an apnea index (AI) of 5.7 /hour. There were 11 hypopneas with a hypopnea index of 2.9 /hour. The patient also had 0 respiratory event related arousals (RERAs).  ?    ?The total APNEA/HYPOPNEA INDEX (AHI) was 8.6/hour and the total RESPIRATORY DISTURBANCE INDEX was  8.6 /hour.  18 events occurred in REM sleep and 19 events in NREM. The REM AHI was  33.8 /hour, versus a non-REM AHI of 4.5. The patient spent 230 minutes of total sleep time in the supine position and 0 minutes in non-supine.. The supine AHI was 8.6 versus a non-supine AHI of n/a. ? ?OXYGEN SATURATION & C02:  The Wake baseline 02 saturation was 93%, with the lowest being 82%. Time spent below 89% saturation equaled 5 minutes. ?  ?PERIODIC LIMB MOVEMENTS: The patient had a total of 71 Periodic Limb Movements.  The Periodic Limb Movement (PLM) index was 18.5 and the PLM Arousal index was .8/hour. ? ?Audio and video analysis did not show any abnormal or unusual movements, behaviors, phonations or vocalizations. The patient took 2 bathroom breaks and had difficulty going back to sleep. Mild to moderate snoring was noted. The EKG was in keeping with normal sinus rhythm (NSR). ?Post-study, the patient  indicated that sleep was the same as usual.  ? ?IMPRESSION: ?Obstructive Sleep Apnea (OSA) ?Periodic Limb Movement Disorder (PLMD) ?Dysfunctions associated with sleep stages or arousal from sleep ? ?RECOMMENDATIONS: ?This study demonstrates overall mild obstructive sleep apnea, severe in REM sleep with a total AHI of 8.6/hour, REM AHI of 33.8/hour, and O2 nadir of 82%. The study was somewhat limited due to poor sleep consolidation in the second  two-thirds of the night, which may have led to an underestimation of his sleep disordered breathing. Given the patient's medical history and sleep related complaints, treatment with positive airway pressure is recommended; this can be achieved in the form of autoPAP. Alternatively, a full-night CPAP titration study would allow optimization of therapy if needed, down the road. Other treatment options may include avoidance of supine sleep position along with weight loss, or the use of an oral appliance in selected patients.   ?Please note that untreated obstructive sleep apnea may carry additional perioperative morbidity. Patients with significant obstructive sleep apnea should receive perioperative PAP therapy and the surgeons and particularly the anesthesiologist should be informed of the diagnosis and the severity of the sleep disordered breathing. ?Mild PLMs (periodic limb movements of sleep) were noted during this study with no significant arousals; clinical correlation is recommended. Medication effect from the antidepressant medication should be considered.  ?This study shows sleep fragmentation and abnormal sleep stage percentages; these are nonspecific findings and per se do not signify an intrinsic sleep disorder or a cause for the patient's sleep-related symptoms. Causes include (but are not limited to) the first night effect of the sleep study, circadian rhythm disturbances, medication effect or an underlying mood disorder or medical problem.  ?The patient should be cautioned not to drive, work at heights, or operate dangerous or heavy equipment when tired or sleepy. Review and reiteration of good sleep hygiene measures should be pursued with any patient. ?The patient will be seen in follow-up by Dr. Rexene Alberts at [facility] for discussion of the test results and further management strategies. The referring provider will be notified of the test results. ? ?I certify that I have reviewed the entire raw data recording  prior to the issuance of this report in accordance with the Standards of Accreditation of the [place] [employer] (AASM) ? ?Star Age, MD, PhD ?Diplomat, American Board of Neurology and Sleep Medicine (Neurology and Sleep Medicine) ?

## 2022-02-02 NOTE — Telephone Encounter (Signed)
Mr. Luellen has genotype 1b chronic hepatitis C with initial viral load of 525,000 and fibrosis score of F4 with APRI score of 0.71 and FIB4 of 3.04. Child Pugh class A with compensated cirrhosis. Plan for 12 weeks with Epclusa. Spoke with Mr. Umstead regarding results and he is agreement with the plan. ? ?Marcos Eke, NP ?02/02/2022 ?3:21 PM ? ?

## 2022-02-02 NOTE — Telephone Encounter (Signed)
-----   Message from Huston Foley, MD sent at 02/02/2022  7:43 AM EST ----- ?Patient referred by Dr. Alvy Bimler, seen by me on 01/19/22, diagnostic PSG on 01/27/22.   ? ?Please call and notify the patient that the recent sleep showed overall mild OSA. He did not sleep very well in the second 2/3 of the night. Given his medical Hx and sleepiness during the day, I would like to suggest treatment of his sleep apnea with autoPAP, which means, that we don't have to bring him back for a second sleep study with CPAP, but will let him try an autoPAP machine at home, through a DME company (of his choice, or as per insurance requirement). The DME representative will educate him on how to use the machine, how to put the mask on, etc. I have placed an order in the chart. Please send referral, talk to patient, send report to referring MD. We will need a FU in sleep clinic for 10 weeks post-PAP set up, please arrange that with me or one of our NPs. Thanks,  ? ?Huston Foley, MD, PhD ?[facility]) ? ? ? ?

## 2022-02-02 NOTE — Addendum Note (Signed)
Addended by: Star Age on: 02/02/2022 07:43 AM ? ? Modules accepted: Orders ? ?

## 2022-02-02 NOTE — Telephone Encounter (Signed)
I will start a PA

## 2022-02-03 ENCOUNTER — Telehealth: Payer: Self-pay

## 2022-02-03 NOTE — Telephone Encounter (Signed)
[facility] Patient Advocate Encounter ?  ?Received notification from [employer] that prior authorization for Dorita Fray is required. ?  ?PA submitted on 02/03/22 ?Key [id number] ?Status is pending ?   ?[facility] will continue to follow. ? ? ?Clearance Coots, CPhT ?Specialty Pharmacy Patient Advocate ?[facility] ?Phone: [phone] ?Fax:  [phone]  ?

## 2022-02-04 ENCOUNTER — Other Ambulatory Visit (HOSPITAL_COMMUNITY): Payer: Self-pay

## 2022-02-04 ENCOUNTER — Other Ambulatory Visit: Payer: Self-pay | Admitting: Pharmacist

## 2022-02-04 ENCOUNTER — Telehealth: Payer: Self-pay

## 2022-02-04 DIAGNOSIS — B182 Chronic viral hepatitis C: Secondary | ICD-10-CM

## 2022-02-04 MED ORDER — SOFOSBUVIR-VELPATASVIR 400-100 MG PO TABS
1.0000 | ORAL_TABLET | Freq: Every day | ORAL | 2 refills | Status: DC
  Filled 2022-02-04 – 2022-02-05 (×2): qty 28, 28d supply, fill #0
  Filled 2022-03-05: qty 28, 28d supply, fill #1
  Filled 2022-03-25: qty 28, 28d supply, fill #2

## 2022-02-04 NOTE — Telephone Encounter (Signed)
[facility] Patient Advocate Encounter ? ?Prior Authorization for [name] has been approved.   ? ?PA# [id number] ?Effective dates: 03/009/23 through 04/28/22 ? ?Patients co-pay is $2353.25.  ? ?I will reach out to the patient to get income information so I can sign him up for HealthWell Patient Assistance. ? ?Prescription can be filled at [facility]. ? ?[facility] will continue to follow. ? ?[name], CPhT ?Specialty Pharmacy Patient Advocate ?[facility] ?Phone: [phone] ?Fax:  [phone]  ?

## 2022-02-04 NOTE — Telephone Encounter (Signed)
[facility] Patient Advocate Encounter ? ?I was successful in securing patient a $30,000 grant from [employer] to provide copayment coverage for [place].  This will make the out of pocket expense $0.00.   ?  ?Healthwell ID: [id number] ? ?I have spoken with the patient..   ?  ?The billing information is as follows and has been shared with [facility].  ?  ?RxBin: [id number] ?PCN: [id number] ?Member ID: [id number] ?Group ID: [id number] ?Dates of Eligibility: 01/04/23 through 12/05/22 ? ?Patient knows to call the office with questions or concerns. ? ?Clearance Coots, CPhT ?Specialty Pharmacy Patient Advocate ?[facility] ?Phone: [phone] ?Fax:  [phone]  ?

## 2022-02-05 ENCOUNTER — Other Ambulatory Visit (HOSPITAL_COMMUNITY): Payer: Self-pay

## 2022-02-05 ENCOUNTER — Telehealth: Payer: Self-pay

## 2022-02-05 NOTE — Telephone Encounter (Signed)
Patient was contacted to counsel on Epclusa for HCV. Educated to take one tablet daily, with or without food, but taking with food may help reduce stomach upset. Reiterated the importance of adhering to regimen and showing up to future appointments in order to ensure resolution of infection. Patient aware this medicine may cause fatigue or headache. Requested patient contact [facility] if there are any issues obtaining medication. ? ?Valeda Malm, Pharm.D. ?PGY-1 Pharmacy Resident ?02/05/2022 11:23 AM ?

## 2022-02-08 ENCOUNTER — Encounter: Payer: Self-pay | Admitting: *Deleted

## 2022-02-08 ENCOUNTER — Telehealth: Payer: Self-pay

## 2022-02-08 NOTE — Telephone Encounter (Signed)
I called pt back and gave him the rest of results for his DME.  He wanted to see what was in network with his insurance co. And then let us know.  He was concerned about recalls.  I relayed that there was a recall of machines (respironics from several yrs ago).  We are using resmed machines and these are ordered.  [facility] is DME that pts have had issues with so we are trying to [place] other DME at this time.  He has [employer] MCR and [employer] is not option.  He will touch base with his insurance and see who is in network then let us know.   ?

## 2022-02-08 NOTE — Telephone Encounter (Signed)
Opened in error

## 2022-02-08 NOTE — Telephone Encounter (Signed)
[facility] Patient Advocate Encounter ? ?Patient's medications have been couriered to [facility] from [facility] and will be picked up 02/09/22. ? ?[name] , CPhT ?Specialty Pharmacy Patient Advocate ?[facility] ?Phone: [phone] ?Fax:  [phone]  ?

## 2022-02-10 ENCOUNTER — Other Ambulatory Visit (HOSPITAL_COMMUNITY): Payer: Self-pay

## 2022-03-01 ENCOUNTER — Other Ambulatory Visit (HOSPITAL_COMMUNITY): Payer: Self-pay

## 2022-03-04 ENCOUNTER — Other Ambulatory Visit (HOSPITAL_COMMUNITY): Payer: Self-pay

## 2022-03-05 ENCOUNTER — Other Ambulatory Visit (HOSPITAL_COMMUNITY): Payer: Self-pay

## 2022-03-08 ENCOUNTER — Other Ambulatory Visit (HOSPITAL_COMMUNITY): Payer: Self-pay

## 2022-03-09 ENCOUNTER — Telehealth: Payer: Self-pay

## 2022-03-09 NOTE — Telephone Encounter (Signed)
[facility] Patient Advocate Encounter ? ?Patient's medications have been couriered to [facility] from [facility] and will be picked up 03/18/22. ? ?2nd box of Epclusa  ? ?Clearance Coots , CPhT ?Specialty Pharmacy Patient Advocate ?[facility] ?Phone: [phone] ?Fax:  [phone]  ?

## 2022-03-18 ENCOUNTER — Ambulatory Visit (INDEPENDENT_AMBULATORY_CARE_PROVIDER_SITE_OTHER): Payer: Medicare Other | Admitting: Pharmacist

## 2022-03-18 ENCOUNTER — Other Ambulatory Visit: Payer: Self-pay

## 2022-03-18 DIAGNOSIS — B182 Chronic viral hepatitis C: Secondary | ICD-10-CM

## 2022-03-18 NOTE — Progress Notes (Signed)
? ?03/18/2022 ? ?HPI: [name] is a 76 y.o. male who presents to the [facility] pharmacy clinic for Hepatitis C follow-up. ? ?Medication: Epclusa ? ?Start Date: 02/23/2022 ? ?Hepatitis C Genotype: 1b ? ?Fibrosis Score: F4 ? ?Hepatitis C RNA: 525,000 ? ?Patient Active Problem List  ? Diagnosis Date Noted  ? Chronic hepatitis C without hepatic coma (HCC) 01/26/2022  ? Healthcare maintenance 01/26/2022  ? Atherosclerosis of aorta (HCC) 06/16/2021  ? Hypertensive heart disease with chronic diastolic congestive heart failure (HCC) 06/16/2021  ? Alcoholic cirrhosis of liver without ascites (HCC) 06/16/2021  ? Calculus of gallbladder without cholecystitis without obstruction 06/16/2021  ? Chronic diastolic heart failure (HCC) 12/11/2020  ? Alcohol abuse with alcohol-induced mood disorder (HCC) 06/16/2019  ? PTSD (post-traumatic stress disorder) 06/16/2019  ? Thrombocytopenia (HCC) 02/24/2019  ? Chronic alcohol abuse 12/09/2015  ? Coronary artery disease involving coronary bypass graft of native heart without angina pectoris 08/07/2014  ? Essential hypertension 08/07/2014  ? Dyslipidemia 08/07/2014  ? ? ?Patient's Medications  ?New Prescriptions  ? No medications on file  ?Previous Medications  ? ESCITALOPRAM (LEXAPRO) 5 MG TABLET    Take 5 mg by mouth daily.  ? EVOLOCUMAB (REPATHA SURECLICK) 140 MG/ML SOAJ    Inject 140 mg into the skin every 14 (fourteen) days.  ? INDAPAMIDE (LOZOL) 1.25 MG TABLET    Take 1 tablet (1.25 mg total) by mouth daily.  ? IRBESARTAN (AVAPRO) 300 MG TABLET    Take 300 mg by mouth daily.  ? OFLOXACIN (OCUFLOX) 0.3 % OPHTHALMIC SOLUTION    Place into the left eye.  ? PREDNISOLONE ACETATE (PRED FORTE) 1 % OPHTHALMIC SUSPENSION    Place into the left eye.  ? SILDENAFIL (VIAGRA) 100 MG TABLET    Take 100 mg by mouth daily as needed.  ? SOFOSBUVIR-VELPATASVIR (EPCLUSA) 400-100 MG TABS    Take 1 tablet by mouth daily.  ? SPIRONOLACTONE (ALDACTONE) 25 MG TABLET    Take 1 tablet (25 mg total) by mouth  daily.  ?Modified Medications  ? No medications on file  ?Discontinued Medications  ? No medications on file  ? ? ?Allergies: ?Allergies  ?Allergen Reactions  ? Codeine Palpitations  ? Vicodin [Hydrocodone-Acetaminophen] Nausea And Vomiting, Swelling and Palpitations  ? ? ?Past Medical History: ?Past Medical History:  ?Diagnosis Date  ? Anginal pain (HCC) 01/2005  ? Anxiety   ? Arthritis   ? "knees"  ? Chronic lower back pain   ? Colon polyp   ? Coronary artery disease   ? Depression   ? Hepatitis   ? "don't know what kind; they treated me for it; was a long time ago" (12/11/2018)  ? High cholesterol   ? History of bronchitis   ? "used to get it alot" (08/25/2012)  ? Hypertension   ? Panic attacks   ? PTSD (post-traumatic stress disorder)   ? "have been treated in the past" (08/25/2012)  ? ? ?Social History: ?Social History  ? ?Socioeconomic History  ? Marital status: Divorced  ?  Spouse name: Not on file  ? Number of children: Not on file  ? Years of education: Not on file  ? Highest education level: Not on file  ?Occupational History  ? Not on file  ?Tobacco Use  ? Smoking status: Former  ?  Packs/day: 2.00  ?  Years: 5.00  ?  Pack years: 10.00  ?  Types: Cigarettes  ? Smokeless tobacco: Never  ? Tobacco comments:  ?  08/25/2012 "quit smoking cigarettes 40 yr ago"  ?Vaping Use  ? Vaping Use: Never used  ?Substance and Sexual Activity  ? Alcohol use: Not Currently  ?  Alcohol/week: 60.0 standard drinks  ?  Types: 60 Standard drinks or equivalent per week  ?  Comment: 12/11/2018 "~ 1 pint of vodka/day"  ? Drug use: Yes  ?  Types: Marijuana  ? Sexual activity: Yes  ?Other Topics Concern  ? Not on file  ?Social History Narrative  ? Not on file  ? ?Social Determinants of Health  ? ?Financial Resource Strain: Not on file  ?Food Insecurity: Not on file  ?Transportation Needs: Not on file  ?Physical Activity: Not on file  ?Stress: Not on file  ?Social Connections: Not on file  ? ? ?Labs: ?Hepatitis C ?Lab Results  ?Component  Value Date  ? HCVGENOTYPE 1b 01/28/2022  ? HEPCAB REACTIVE (A) 12/30/2021  ? HCVRNAPCRQN 525,000 (H) 12/30/2021  ? ?Hepatitis B ?Lab Results  ?Component Value Date  ? HEPBSAB NON-REACTIVE 12/30/2021  ? HEPBSAG NON-REACTIVE 12/30/2021  ? HEPBCAB Positive (A) 02/25/2019  ? ?Hepatitis A ?Lab Results  ?Component Value Date  ? HAV NON-REACTIVE 12/30/2021  ? ?HIV ?Lab Results  ?Component Value Date  ? HIV NON-REACTIVE 01/28/2022  ? ?Lab Results  ?Component Value Date  ? CREATININE 1.11 12/03/2021  ? CREATININE 1.18 09/17/2021  ? CREATININE 1.18 08/19/2021  ? CREATININE 1.14 08/09/2021  ? CREATININE 1.09 06/16/2021  ? ?Lab Results  ?Component Value Date  ? AST 31 01/28/2022  ? AST 20 12/03/2021  ? AST 28 09/17/2021  ? ALT 48 (H) 01/28/2022  ? ALT 26 12/03/2021  ? ALT 30 09/17/2021  ? INR 1.1 (H) 12/30/2021  ? INR 1.3 (H) 02/24/2019  ? INR 1.08 08/25/2012  ? ? ?Assessment: ?[name] is here today for Hepatitis C follow-up. He is being treated with Epclusa x 12 weeks for chronic Hepatitis C infection. Currently taking Epclusa, he reports good adherence and no missed doses. He reports experiencing some fatigue and nausea, but otherwise no issues with tolerance. Will check CMET today since LFTs were previously elevated.  ? ?Counseled patient to continue taking Epclusa daily with or without food. Reinforced the importance of good adherence and to not miss any doses, as their chance of cure could go down with each dose missed. Counseled patient on what to do if dose is missed - if it is closer to the missed dose take immediately; if closer to next dose skip dose and take the next dose at the usual time. Counseled patient on common side effects such as headache, fatigue, and nausea and that these normally decrease with time. I reviewed patient medications and found no drug interactions. Discussed with patient that there are several drug interactions including acid suppressants. Instructed patient to call clinic if he wishes to  start a new medication during course of therapy. Also advised patient to call if he experiences any side effects.  ?  ?Plan: ?- Check HCV RNA and CMET today ?- Continue Epclusa x 12 weeks  ?- Return to clinic in 1 month to pick up third box of Epclusa  ?- End of treatment visit scheduled with Greg for 6/23  ? ?Jerrilyn Cairo, PharmD ?PGY1 Pharmacy Resident ?03/18/2022 11:32 AM  ?

## 2022-03-22 LAB — COMPREHENSIVE METABOLIC PANEL
AG Ratio: 1.1 (calc) (ref 1.0–2.5)
ALT: 13 U/L (ref 9–46)
AST: 15 U/L (ref 10–35)
Albumin: 4.3 g/dL (ref 3.6–5.1)
Alkaline phosphatase (APISO): 57 U/L (ref 35–144)
BUN/Creatinine Ratio: 26 (calc) — ABNORMAL HIGH (ref 6–22)
BUN: 40 mg/dL — ABNORMAL HIGH (ref 7–25)
CO2: 24 mmol/L (ref 20–32)
Calcium: 9.6 mg/dL (ref 8.6–10.3)
Chloride: 103 mmol/L (ref 98–110)
Creat: 1.51 mg/dL — ABNORMAL HIGH (ref 0.70–1.28)
Globulin: 4 g/dL (calc) — ABNORMAL HIGH (ref 1.9–3.7)
Glucose, Bld: 95 mg/dL (ref 65–99)
Potassium: 4.5 mmol/L (ref 3.5–5.3)
Sodium: 136 mmol/L (ref 135–146)
Total Bilirubin: 0.7 mg/dL (ref 0.2–1.2)
Total Protein: 8.3 g/dL — ABNORMAL HIGH (ref 6.1–8.1)

## 2022-03-22 LAB — HEPATITIS C RNA QUANTITATIVE
HCV Quantitative Log: <1.18 log IU/mL — ABNORMAL HIGH
HCV RNA, PCR, QN: <15 IU/mL — ABNORMAL HIGH

## 2022-03-23 ENCOUNTER — Ambulatory Visit: Payer: Medicare Other | Admitting: Emergency Medicine

## 2022-03-24 ENCOUNTER — Ambulatory Visit (INDEPENDENT_AMBULATORY_CARE_PROVIDER_SITE_OTHER): Payer: Medicare Other | Admitting: Internal Medicine

## 2022-03-24 VITALS — BP 134/76 | HR 67 | Temp 97.8°F | Ht 71.0 in | Wt 246.0 lb

## 2022-03-24 DIAGNOSIS — N179 Acute kidney failure, unspecified: Secondary | ICD-10-CM | POA: Diagnosis not present

## 2022-03-24 DIAGNOSIS — M545 Low back pain, unspecified: Secondary | ICD-10-CM | POA: Diagnosis not present

## 2022-03-24 DIAGNOSIS — R350 Frequency of micturition: Secondary | ICD-10-CM | POA: Insufficient documentation

## 2022-03-24 DIAGNOSIS — J019 Acute sinusitis, unspecified: Secondary | ICD-10-CM | POA: Diagnosis not present

## 2022-03-24 LAB — URINALYSIS, ROUTINE W REFLEX MICROSCOPIC
Bilirubin Urine: NEGATIVE
Hgb urine dipstick: NEGATIVE
Ketones, ur: NEGATIVE
Leukocytes,Ua: NEGATIVE
Nitrite: NEGATIVE
RBC / HPF: NONE SEEN (ref 0–?)
Specific Gravity, Urine: 1.01 (ref 1.000–1.030)
Total Protein, Urine: NEGATIVE
Urine Glucose: NEGATIVE
Urobilinogen, UA: 1 (ref 0.0–1.0)
pH: 6.5 (ref 5.0–8.0)

## 2022-03-24 LAB — BASIC METABOLIC PANEL
BUN: 36 mg/dL — ABNORMAL HIGH (ref 6–23)
CO2: 28 mEq/L (ref 19–32)
Calcium: 9.9 mg/dL (ref 8.4–10.5)
Chloride: 101 mEq/L (ref 96–112)
Creatinine, Ser: 1.39 mg/dL (ref 0.40–1.50)
GFR: 49.9 mL/min — ABNORMAL LOW (ref >60.00)
Glucose, Bld: 90 mg/dL (ref 70–99)
Potassium: 4.6 mEq/L (ref 3.5–5.1)
Sodium: 135 mEq/L (ref 135–145)

## 2022-03-24 LAB — CBC WITH DIFFERENTIAL/PLATELET
Basophils Absolute: 0 10*3/uL (ref 0.0–0.1)
Basophils Relative: 0.6 % (ref 0.0–3.0)
Eosinophils Absolute: 0.1 10*3/uL (ref 0.0–0.7)
Eosinophils Relative: 1.1 % (ref 0.0–5.0)
HCT: 38.7 % — ABNORMAL LOW (ref 39.0–52.0)
Hemoglobin: 13 g/dL (ref 13.0–17.0)
Lymphocytes Relative: 42.1 % (ref 12.0–46.0)
Lymphs Abs: 2.1 10*3/uL (ref 0.7–4.0)
MCHC: 33.5 g/dL (ref 30.0–36.0)
MCV: 89.6 fl (ref 78.0–100.0)
Monocytes Absolute: 0.4 10*3/uL (ref 0.1–1.0)
Monocytes Relative: 8.9 % (ref 3.0–12.0)
Neutro Abs: 2.4 10*3/uL (ref 1.4–7.7)
Neutrophils Relative %: 47.3 % (ref 43.0–77.0)
Platelets: 95 10*3/uL — ABNORMAL LOW (ref 150.0–400.0)
RBC: 4.32 Mil/uL (ref 4.22–5.81)
RDW: 12.5 % (ref 11.5–15.5)
WBC: 5 10*3/uL (ref 4.0–10.5)

## 2022-03-24 MED ORDER — LEVOFLOXACIN 500 MG PO TABS: 500.0000 mg | ORAL_TABLET | Freq: Every day | ORAL | 0 refills | Status: AC

## 2022-03-24 NOTE — Patient Instructions (Signed)
Please take all new medication as prescribed - the antibiotic  Please continue all other medications as before, and refills have been done if requested.  Please have the pharmacy call with any other refills you may need.  Please keep your appointments with your specialists as you may have planned  Please go to the LAB at the blood drawing area for the tests to be done  You will be contacted by phone if any changes need to be made immediately.  Otherwise, you will receive a letter about your results with an explanation, but please check with MyChart first.  Please remember to sign up for MyChart if you have not done so, as this will be important to you in the future with finding out test results, communicating by private email, and scheduling acute appointments online when needed.     

## 2022-03-24 NOTE — Progress Notes (Signed)
Patient ID: [name], male   DOB: 08/14/1946, 76 y.o.   MRN: [id number] ? ? ? ?    Chief Complaint: follow up sinus and ear symptoms, urinary frequency, left lower back pain, and AKI ? ?     HPI:  [name] is a 76 y.o. male  Here with 2-3 days acute onset fever, left ear and facial pain, pressure, headache, general weakness and malaise, and greenish d/c, with mild ST and cough, but pt denies chest pain, wheezing, increased sob or doe, orthopnea, PND, increased LE swelling, palpitations, dizziness or syncope.  Also has mild onset 2 days urinary frequency, but Denies urinary symptoms such as dysuria, urgency, flank pain, hematuria or n/v, fever, chills. Does also have 1 wk onset left low back pain, sharp, without radiation, constant, worse to bend, twist and stand up and no radicular symptoms  Also with recent worsening renal fxn on lab testing for unclear reason, though has been getting hep c antiviral tx.   ?      ?Wt Readings from Last 3 Encounters:  ?03/24/22 246 lb (111.6 kg)  ?01/26/22 242 lb 12.8 oz (110.1 kg)  ?01/19/22 243 lb (110.2 kg)  ? ?BP Readings from Last 3 Encounters:  ?03/24/22 134/76  ?01/26/22 (!) 151/75  ?01/19/22 (!) 142/72  ? ?      ?Past Medical History:  ?Diagnosis Date  ? Anginal pain ([place]) 01/2005  ? Anxiety   ? Arthritis   ? "knees"  ? Chronic lower back pain   ? Colon polyp   ? Coronary artery disease   ? Depression   ? Hepatitis   ? "don't know what kind; they treated me for it; was a long time ago" (12/11/2018)  ? High cholesterol   ? History of bronchitis   ? "used to get it alot" (08/25/2012)  ? Hypertension   ? Panic attacks   ? PTSD (post-traumatic stress disorder)   ? "have been treated in the past" (08/25/2012)  ? ?Past Surgical History:  ?Procedure Laterality Date  ? CARDIAC CATHETERIZATION  01/2005  ? CORONARY ARTERY BYPASS GRAFT  01/2005  ? CABG X3  ? Shrapnel Left 1969  ? LLE; left lateral thumb (required grafting)  ? VARICOSE VEIN SURGERY  1980's  ? LLE  ? ? reports  that he has quit smoking. His smoking use included cigarettes. He has a 10.00 pack-year smoking history. He has never used smokeless tobacco. He reports that he does not currently use alcohol after a past usage of about 60.0 standard drinks per week. He reports current drug use. Drug: Marijuana. ?family history includes Cancer in his brother; Diabetes in his mother; Heart disease in his father; Sleep apnea in his sister. ?Allergies  ?Allergen Reactions  ? Codeine Palpitations  ? Vicodin [Hydrocodone-Acetaminophen] Nausea And Vomiting, Swelling and Palpitations  ? ?Current Outpatient Medications on File Prior to Visit  ?Medication Sig Dispense Refill  ? escitalopram (LEXAPRO) 5 MG tablet Take 5 mg by mouth daily.    ? Evolocumab (REPATHA SURECLICK) [id number] MG/ML SOAJ Inject 140 mg into the skin every 14 (fourteen) days. 2 mL 11  ? indapamide (LOZOL) 1.25 MG tablet Take 1 tablet (1.25 mg total) by mouth daily. 90 tablet 3  ? irbesartan (AVAPRO) 300 MG tablet Take 300 mg by mouth daily.    ? ofloxacin (OCUFLOX) 0.3 % ophthalmic solution Place into the left eye.    ? prednisoLONE acetate (PRED FORTE) 1 % ophthalmic suspension Place into the left  eye.    ? sildenafil (VIAGRA) 100 MG tablet Take 100 mg by mouth daily as needed.    ? Sofosbuvir-Velpatasvir (EPCLUSA) 400-100 MG TABS Take 1 tablet by mouth daily. 28 tablet 2  ? spironolactone (ALDACTONE) 25 MG tablet Take 1 tablet (25 mg total) by mouth daily. 30 tablet 5  ? ?No current facility-administered medications on file prior to visit.  ? ?     ROS:  All others reviewed and negative. ? ?Objective  ? ?     PE:  BP 134/76 (BP Location: Right Arm, Patient Position: Sitting, Cuff Size: Large)   Pulse 67   Temp 97.8 ?F (36.6 ?C) (Oral)   Ht 5\' 11"  (1.803 m)   Wt 246 lb (111.6 kg)   SpO2 97%   BMI 34.31 kg/m?  ? ?              Constitutional: Pt appears in NAD ?              HENT: Head: NCAT.  ?              Right Ear: External ear normal.   ?              Left Ear:  External ear normal. Bilat tm's with mild erythema.  Max sinus areas mild tender.  Pharynx with mild erythema, no exudate  ?              Eyes: . Pupils are equal, round, and reactive to light. Conjunctivae and EOM are normal ?              Nose: without d/c or deformity ?              Neck: Neck supple. Gross normal ROM ?              Cardiovascular: Normal rate and regular rhythm.   ?              Pulmonary/Chest: Effort normal and breath sounds without rales or wheezing.  ?              Abd:  Soft, NT, ND, + BS, no organomegaly - benign ?              Spine nontender, has mild left lumbar paravertebral spasm tender ?              Neurological: Pt is alert. At baseline orientation, motor grossly intact ?              Skin: Skin is warm. No rashes, no other new lesions, LE edema - none ?              Psychiatric: Pt behavior is normal without agitation  ? ?Micro: none ? ?Cardiac tracings I have personally interpreted today:  none ? ?Pertinent Radiological findings (summarize): none  ? ?Lab Results  ?Component Value Date  ? WBC 5.0 03/24/2022  ? HGB 13.0 03/24/2022  ? HCT 38.7 (L) 03/24/2022  ? PLT 95.0 (L) 03/24/2022  ? GLUCOSE 90 03/24/2022  ? CHOL 128 12/03/2021  ? TRIG 174 (H) 12/03/2021  ? HDL 44 12/03/2021  ? LDLDIRECT 47 12/03/2021  ? [place] 55 12/03/2021  ? ALT 13 03/18/2022  ? AST 15 03/18/2022  ? NA 135 03/24/2022  ? K 4.6 03/24/2022  ? CL 101 03/24/2022  ? CREATININE 1.39 03/24/2022  ? BUN 36 (H) 03/24/2022  ? CO2 28 03/24/2022  ? INR 1.1 (H)  12/30/2021  ? HGBA1C 5.9 (H) 09/17/2021  ? ?Assessment/Plan:  ?[name] is a 76 y.o. Black or African American [2] male with  has a past medical history of Anginal pain ([place]) (01/2005), Anxiety, Arthritis, Chronic lower back pain, Colon polyp, Coronary artery disease, Depression, Hepatitis, High cholesterol, History of bronchitis, Hypertension, Panic attacks, and PTSD (post-traumatic stress disorder). ? ?Acute sinus infection ?Mild to mod, for antibx course,   to f/u any worsening symptoms or concerns ? ?Urinary frequency ?? Clinical signifiicance, exam o;w benign, for urine culture ? ?Low back pain ?Exam is c/w msk strain, for tyleno prn,  to f/u any worsening symptoms or concerns ? ?AKI (acute kidney injury) ([place]) ?Etiology unclear, ? Related to hep c treatment, for increased oral fluids, and recheck lab ? ?Followup: Return if symptoms worsen or fail to improve. ? ?Cathlean Cower, MD 03/28/2022 5:32 PM ?[place] ?[place] ?Internal Medicine ?

## 2022-03-25 ENCOUNTER — Telehealth: Payer: Self-pay | Admitting: Student-PharmD

## 2022-03-25 ENCOUNTER — Encounter: Payer: Self-pay | Admitting: *Deleted

## 2022-03-25 ENCOUNTER — Telehealth: Payer: Self-pay

## 2022-03-25 ENCOUNTER — Other Ambulatory Visit (HOSPITAL_COMMUNITY): Payer: Self-pay

## 2022-03-25 LAB — URINE CULTURE: Result:: NO GROWTH

## 2022-03-25 NOTE — Telephone Encounter (Signed)
Pt said have company that is in [employer]. Would like a call from the nurse. ? ?

## 2022-03-25 NOTE — Telephone Encounter (Signed)
Orders sent to [employer]/DME for PAP via community message.  ?

## 2022-03-25 NOTE — Telephone Encounter (Signed)
Returned patient's call regarding his questions about Hep C labs. ? ?Patient recently saw a physician who told patient to ask [place] about his Hep C labs. Informed patient that the HCV RNA lab shows [place] how much HCV is in his body and that the 4/20 result of <15 means that the Raeanne Gathers is working. Emphasized to patient that he still must continue his Epclusa and complete the entire 12 week course or his Hepatitis C will not be cured. Patient admits to missing yesterday's dose of Eplcusa due to uncertainty of his labs but says he will resume treatment.  ? ?Patient inquired about his creatinine levels as he was told by the physician that Raeanne Gathers can hurt his kidneys. Explained to the patient that Epclusa does not affect the kidneys and that creatinine is a marker of kidney function, but various factors can impact the levels, such as dehydration, NSAIDs, and other medications.  ? ?Patient then said he would like to speak to his doctor. Told patient that we will contact Marya Amsler to give him a call back.  ? ?Marlowe Alt, PharmD Candidate ?03/25/2022 4:57 PM ?  ?

## 2022-03-25 NOTE — Telephone Encounter (Signed)
Called pt back and LVM (ok per DPR) asking for call back. Also advised I would send a mychart message he can respond to. I advised if he calls back and nurses are not available, he can leave the name of the company with the phone rep and then we can send the orders to the company. Left office number in message.  ?

## 2022-03-25 NOTE — Telephone Encounter (Signed)
Pt called and states he spoke with his insurance. ?States [employer] is company in his network where he would like to order from.  ?

## 2022-03-25 NOTE — Telephone Encounter (Signed)
Alvan Dame spoke with patient. See documentation from her. Thank you.

## 2022-03-25 NOTE — Telephone Encounter (Signed)
Received message from pharmacy staff regarding [name] concern about renal function. Recently seen in Internal Medicine for sinusitis and noted to have elevated creatinine of 1.51 from previous 1.1 with repeat now 1.39. Discussed that it is unlikely that [name] is causing this and encouraged to continue with medication to complete course of 12 weeks. Encouraged to adequately hydrate. Will continue to closely monitor renal function going forward and follow up in 1 month or sooner if needed. [name] is good with this plan and feels more comfortable.  ? ?Marcos Eke, NP ?03/25/2022 ?5:02 PM ? ?

## 2022-03-25 NOTE — Telephone Encounter (Signed)
-  Patient left vm in triage stating his PCP saw a non detected HEP C result and he has questions regarding Hep C treatment if this is the case. Please call - ?

## 2022-03-27 ENCOUNTER — Other Ambulatory Visit: Payer: Self-pay | Admitting: Interventional Cardiology

## 2022-03-28 ENCOUNTER — Encounter: Payer: Self-pay | Admitting: Internal Medicine

## 2022-03-28 NOTE — Assessment & Plan Note (Signed)
Exam is c/w msk strain, for tyleno prn,  to f/u any worsening symptoms or concerns ?

## 2022-03-28 NOTE — Assessment & Plan Note (Signed)
Etiology unclear, ? Related to hep c treatment, for increased oral fluids, and recheck lab ?

## 2022-03-28 NOTE — Assessment & Plan Note (Signed)
Mild to mod, for antibx course,  to f/u any worsening symptoms or concerns 

## 2022-03-28 NOTE — Assessment & Plan Note (Signed)
?   Clinical signifiicance, exam o;w benign, for urine culture ?

## 2022-03-29 NOTE — Telephone Encounter (Signed)
Denyse Amass, RN; Vanessa Ralphs ?got it!   ? ?  ?Previous Messages ?  ?----- Message -----  ?From: Brandon Melnick, RN  ?Sent: 03/25/2022   3:33 PM EDT  ?To: Ocie Bob  ? ?New orders in Epic for pt  ? ?Teresa Pelton  ?Male, 76 y.o., 25-Oct-1946  ?MRN:  ?[id number]  ?Phone:  ?[phone]  ?Thanks,  ? ?Sandy RN   ? ?

## 2022-04-02 ENCOUNTER — Other Ambulatory Visit (HOSPITAL_COMMUNITY): Payer: Self-pay

## 2022-04-02 DIAGNOSIS — G4733 Obstructive sleep apnea (adult) (pediatric): Secondary | ICD-10-CM | POA: Diagnosis not present

## 2022-04-06 ENCOUNTER — Telehealth: Payer: Self-pay

## 2022-04-06 NOTE — Telephone Encounter (Signed)
[facility] Patient Advocate Encounter ? ?Patient's medications have been couriered to [facility] from [facility] and will be picked up . ? ?3rd box of Epclusa ? ? ?Clearance Coots , CPhT ?Specialty Pharmacy Patient Advocate ?[facility] ?Phone: [phone] ?Fax:  [phone]  ?

## 2022-04-27 ENCOUNTER — Other Ambulatory Visit (HOSPITAL_COMMUNITY): Payer: Self-pay

## 2022-05-03 DIAGNOSIS — G4733 Obstructive sleep apnea (adult) (pediatric): Secondary | ICD-10-CM | POA: Diagnosis not present

## 2022-05-07 ENCOUNTER — Other Ambulatory Visit: Payer: Self-pay

## 2022-05-07 ENCOUNTER — Emergency Department (HOSPITAL_BASED_OUTPATIENT_CLINIC_OR_DEPARTMENT_OTHER)
Admission: EM | Admit: 2022-05-07 | Discharge: 2022-05-07 | Disposition: A | Discharge status: Home / Self Care | Payer: Medicare Other | Attending: Emergency Medicine | Admitting: Emergency Medicine

## 2022-05-07 ENCOUNTER — Encounter (HOSPITAL_BASED_OUTPATIENT_CLINIC_OR_DEPARTMENT_OTHER): Payer: Self-pay | Admitting: Emergency Medicine

## 2022-05-07 ENCOUNTER — Emergency Department (HOSPITAL_BASED_OUTPATIENT_CLINIC_OR_DEPARTMENT_OTHER): Payer: Medicare Other

## 2022-05-07 DIAGNOSIS — Z87891 Personal history of nicotine dependence: Secondary | ICD-10-CM | POA: Insufficient documentation

## 2022-05-07 DIAGNOSIS — E875 Hyperkalemia: Secondary | ICD-10-CM | POA: Diagnosis not present

## 2022-05-07 DIAGNOSIS — I509 Heart failure, unspecified: Secondary | ICD-10-CM | POA: Insufficient documentation

## 2022-05-07 DIAGNOSIS — G629 Polyneuropathy, unspecified: Secondary | ICD-10-CM | POA: Diagnosis not present

## 2022-05-07 DIAGNOSIS — R600 Localized edema: Secondary | ICD-10-CM | POA: Diagnosis not present

## 2022-05-07 DIAGNOSIS — I2510 Atherosclerotic heart disease of native coronary artery without angina pectoris: Secondary | ICD-10-CM | POA: Diagnosis not present

## 2022-05-07 DIAGNOSIS — R200 Anesthesia of skin: Secondary | ICD-10-CM | POA: Diagnosis present

## 2022-05-07 LAB — CBC WITH DIFFERENTIAL/PLATELET
Abs Immature Granulocytes: 0.02 10*3/uL (ref 0.00–0.07)
Basophils Absolute: 0 10*3/uL (ref 0.0–0.1)
Basophils Relative: 0 %
Eosinophils Absolute: 0.1 10*3/uL (ref 0.0–0.5)
Eosinophils Relative: 1 %
HCT: 38.8 % — ABNORMAL LOW (ref 39.0–52.0)
Hemoglobin: 12.7 g/dL — ABNORMAL LOW (ref 13.0–17.0)
Immature Granulocytes: 0 %
Lymphocytes Relative: 34 %
Lymphs Abs: 2.3 10*3/uL (ref 0.7–4.0)
MCH: 29.2 pg (ref 26.0–34.0)
MCHC: 32.7 g/dL (ref 30.0–36.0)
MCV: 89.2 fL (ref 80.0–100.0)
Monocytes Absolute: 0.5 10*3/uL (ref 0.1–1.0)
Monocytes Relative: 7 %
Neutro Abs: 3.9 10*3/uL (ref 1.7–7.7)
Neutrophils Relative %: 58 %
Platelets: 107 10*3/uL — ABNORMAL LOW (ref 150–400)
RBC: 4.35 MIL/uL (ref 4.22–5.81)
RDW: 12.3 % (ref 11.5–15.5)
WBC: 6.7 10*3/uL (ref 4.0–10.5)
nRBC: 0 % (ref 0.0–0.2)

## 2022-05-07 LAB — COMPREHENSIVE METABOLIC PANEL
ALT: 15 U/L (ref 0–44)
AST: 27 U/L (ref 15–41)
Albumin: 4.4 g/dL (ref 3.5–5.0)
Alkaline Phosphatase: 52 U/L (ref 38–126)
Anion gap: 9 (ref 5–15)
BUN: 34 mg/dL — ABNORMAL HIGH (ref 8–23)
CO2: 24 mmol/L (ref 22–32)
Calcium: 10.5 mg/dL — ABNORMAL HIGH (ref 8.9–10.3)
Chloride: 103 mmol/L (ref 98–111)
Creatinine, Ser: 1.29 mg/dL — ABNORMAL HIGH (ref 0.61–1.24)
GFR, Estimated: 58 mL/min — ABNORMAL LOW (ref >60)
Glucose, Bld: 113 mg/dL — ABNORMAL HIGH (ref 70–99)
Potassium: 5.3 mmol/L — ABNORMAL HIGH (ref 3.5–5.1)
Sodium: 136 mmol/L (ref 135–145)
Total Bilirubin: 0.7 mg/dL (ref 0.3–1.2)
Total Protein: 9.1 g/dL — ABNORMAL HIGH (ref 6.5–8.1)

## 2022-05-07 MED ORDER — CLOTRIMAZOLE 1 % EX CREA: TOPICAL_CREAM | CUTANEOUS | 0 refills | Status: DC

## 2022-05-07 MED ORDER — GABAPENTIN 300 MG PO CAPS
ORAL_CAPSULE | ORAL | 0 refills | Status: DC
Start: 2022-05-07 — End: 2022-05-28

## 2022-05-07 NOTE — Discharge Instructions (Addendum)
Your work-up today was very reassuring.  Fortunately, your ultrasound was negative for blood clot within your leg.  On exam, you had very good blood flow and there is no signs of infection around the foot.  It is likely that you are experiencing something called neuropathy.  I have sent you in a medication that you can take that may help with some of your symptoms.  I strongly suggest he follow-up closely with your primary care physician and recheck your labs since your potassium was just slightly elevated.  Drink plenty of water.  Return if you develop chest pain or shortness of breath or worsening symptoms.

## 2022-05-07 NOTE — ED Triage Notes (Addendum)
Pt arrives to ED with c/o foot numbness. Pt reports he developed right foot swelling and pain yesterday. He reports that this morning he woke up with right foot worsening numbness. He reports baseline numbness, but it worsened today, at 330am. Dorsalis pedis pulse 2+.

## 2022-05-07 NOTE — ED Provider Notes (Signed)
[facility] EMERGENCY DEPT Provider Note   CSN: [id number] Arrival date & time: 05/07/22  1110     History  Chief Complaint  Patient presents with   Numbness    [name] is a 76 y.o. male with history of CAD, alcohol induced cirrhosis, CHF and prediabetes who presents to the ED for evaluation of right foot numbness and pain that began yesterday.  He reports at baseline numbness in that foot although he noticed that after wearing tight socks yesterday the foot was swollen and very painful.  This morning, the swelling has resolved although he still feels pain and numbness.  He states that he has a small abrasion on the dorsal lateral aspect of his right foot.  He denies fevers, chest pain, shortness of breath, abdominal pain, nausea, vomiting, diarrhea, facial droop, unilateral weakness.  Patient also states that he has athlete's foot in both feet and requests prescription to help with this.  HPI     Home Medications Prior to Admission medications   Medication Sig Start Date End Date Taking? Authorizing Provider  gabapentin (NEURONTIN) 300 MG capsule Take 1 capsule on day 1, take 1 capsule in the morning and the evening on day 2, then take 1 capsule three times daily going forward 05/07/22  Yes Janell Quiet, PA-C  REPATHA SURECLICK 140 MG/ML SOAJ INJECT 140 MG INTO THE SKIN EVERY 14 (FOURTEEN) DAYS. 03/29/22   Lyn Records, MD  escitalopram (LEXAPRO) 5 MG tablet Take 5 mg by mouth daily. 11/09/21   [provider]  indapamide (LOZOL) 1.25 MG tablet Take 1 tablet (1.25 mg total) by mouth daily. 12/07/21   Lyn Records, MD  irbesartan (AVAPRO) 300 MG tablet Take 300 mg by mouth daily.    [provider]  ofloxacin (OCUFLOX) 0.3 % ophthalmic solution Place into the left eye. 12/28/21   [provider]  prednisoLONE acetate (PRED FORTE) 1 % ophthalmic suspension Place into the left eye. 12/28/21   [provider]  sildenafil (VIAGRA)  100 MG tablet Take 100 mg by mouth daily as needed. 06/17/21   [provider]  Sofosbuvir-Velpatasvir (EPCLUSA) 400-100 MG TABS Take 1 tablet by mouth daily. 02/04/22   Jennette Kettle, RPH-CPP  spironolactone (ALDACTONE) 25 MG tablet Take 1 tablet (25 mg total) by mouth daily. 12/03/21   Lyn Records, MD      Allergies    Codeine and Vicodin [hydrocodone-acetaminophen]    Review of Systems   Review of Systems  Physical Exam Updated Vital Signs BP (!) 151/76 (BP Location: Right Arm)   Pulse 62   Temp 98.1 F (36.7 C)   Resp 20   Ht  (1.803 m)   Wt 112.5 kg   SpO2 100%   BMI 34.59 kg/m  Physical Exam Vitals and nursing note reviewed.  Constitutional:      General: He is not in acute distress.    Appearance: He is not ill-appearing.  HENT:     Head: Atraumatic.  Eyes:     Conjunctiva/sclera: Conjunctivae normal.  Cardiovascular:     Rate and Rhythm: Normal rate and regular rhythm.     Pulses:          Radial pulses are 2+ on the right side and 2+ on the left side.       Dorsalis pedis pulses are 2+ on the right side and detected w/ Doppler on the left side.     Heart sounds: No murmur  heard. Pulmonary:     Effort: Pulmonary effort is normal. No respiratory distress.     Breath sounds: Normal breath sounds.  Abdominal:     General: There is no distension.     Palpations: Abdomen is soft.     Tenderness: There is no abdominal tenderness.     Comments: Abdomen is soft, rounded, nontender to palpation  Musculoskeletal:        General: Normal range of motion.     Cervical back: Normal range of motion.     Right lower leg: No edema.     Left lower leg: No edema.       Feet:     Comments: Posterior calves without tenderness bilaterally  Feet:     Comments: Small, 1 cm scabbed over abrasion without erythema, induration or signs of infection.  5 sites were tested bilaterally with alternating sharp and light sensation.  Patient has intact sensation  bilaterally although right side has diminished capacity to differentiate between sharp and light.  Mild skin breakdown in between toes Skin:    General: Skin is warm and dry.     Capillary Refill: Capillary refill takes less than 2 seconds.     Comments: Venous insufficiency changes of feet bilaterally.  Mildly darkened although pulses intact.  Neurological:     General: No focal deficit present.     Mental Status: He is alert.     Comments: Speech is clear, able to follow commands  Normal strength in upper and lower extremities bilaterally including dorsiflexion and plantar flexion, strong and equal grip strength Sensation normal to light and sharp touch Moves extremities without ataxia, coordination intact  No pronator drift    Psychiatric:        Mood and Affect: Mood normal.     ED Results / Procedures / Treatments   Labs (all labs ordered are listed, but only abnormal results are displayed) Labs Reviewed  CBC WITH DIFFERENTIAL/PLATELET - Abnormal; Notable for the following components:      Result Value   Hemoglobin 12.7 (*)    HCT 38.8 (*)    Platelets 107 (*)    All other components within normal limits  COMPREHENSIVE METABOLIC PANEL - Abnormal; Notable for the following components:   Potassium 5.3 (*)    Glucose, Bld 113 (*)    BUN 34 (*)    Creatinine, Ser 1.29 (*)    Calcium 10.5 (*)    Total Protein 9.1 (*)    GFR, Estimated 58 (*)    All other components within normal limits    EKG None  Radiology US Venous Img Lower Unilateral Right  Result Date: 05/07/2022 CLINICAL DATA:  Right lower extremity numbness, edema and pain. EXAM: RIGHT LOWER EXTREMITY VENOUS DOPPLER ULTRASOUND TECHNIQUE: Gray-scale sonography with graded compression, as well as color Doppler and duplex ultrasound were performed to evaluate the lower extremity deep venous systems from the level of the common femoral vein and including the common femoral, femoral, profunda femoral, popliteal  and calf veins including the posterior tibial, peroneal and gastrocnemius veins when visible. The superficial great saphenous vein was also interrogated. Spectral Doppler was utilized to evaluate flow at rest and with distal augmentation maneuvers in the common femoral, femoral and popliteal veins. COMPARISON:  None Available. FINDINGS: Contralateral Common Femoral Vein: Respiratory phasicity is normal and symmetric with the symptomatic side. No evidence of thrombus. Normal compressibility. Common Femoral Vein: No evidence of thrombus. Normal compressibility, respiratory phasicity and response to augmentation. Saphenofemoral  Junction: No evidence of thrombus. Normal compressibility and flow on color Doppler imaging. Profunda Femoral Vein: No evidence of thrombus. Normal compressibility and flow on color Doppler imaging. Femoral Vein: No evidence of thrombus. Normal compressibility, respiratory phasicity and response to augmentation. Popliteal Vein: No evidence of thrombus. Normal compressibility, respiratory phasicity and response to augmentation. Calf Veins: No evidence of thrombus. Normal compressibility and flow on color Doppler imaging. Superficial Great Saphenous Vein: No evidence of thrombus. Normal compressibility. Venous Reflux:  None. Other Findings: No evidence of superficial thrombophlebitis or abnormal fluid collection. IMPRESSION: No evidence of right lower extremity deep venous thrombosis. Electronically Signed   By: Irish LackGlenn  Yamagata M.D.   On: 05/07/2022 12:33    Procedures Procedures    Medications Ordered in ED Medications - No data to display  ED Course/ Medical Decision Making/ A&P                           Medical Decision Making Amount and/or Complexity of Data Reviewed Labs: ordered.  Risk Prescription drug management.   Social determinants of health:  Social History   Socioeconomic History   Marital status: Divorced    Spouse name: Not on file   Number of children: Not  on file   Years of education: Not on file   Highest education level: Not on file  Occupational History   Not on file  Tobacco Use   Smoking status: Former    Packs/day: 2.00    Years: 5.00    Total pack years: 10.00    Types: Cigarettes   Smokeless tobacco: Never   Tobacco comments:    08/25/2012 "quit smoking cigarettes 40 yr ago"  Vaping Use   Vaping Use: Never used  Substance and Sexual Activity   Alcohol use: Not Currently    Alcohol/week: 60.0 standard drinks of alcohol    Types: 60 Standard drinks or equivalent per week    Comment: 12/11/2018 "~ 1 pint of vodka/day"   Drug use: Yes    Types: Marijuana   Sexual activity: Yes  Other Topics Concern   Not on file  Social History Narrative   Not on file   Social Determinants of Health   Financial Resource Strain: Not on file  Food Insecurity: Not on file  Transportation Needs: Not on file  Physical Activity: Not on file  Stress: Not on file  Social Connections: Not on file  Intimate Partner Violence: Not on file     Initial impression:  This patient presents to the ED for concern of right foot numbness and pain, this involves an extensive number of treatment options, and is a complaint that carries with it a high risk of complications and morbidity.   Differentials include CVA, peripheral neuropathy, arterial occlusion, DVT, infection.   Comorbidities affecting care:  CHF, cirrhosis, prediabetes  Additional history obtained: Previous labs for comparison  Lab Tests  I Ordered, reviewed, and interpreted labs and EKG.  The pertinent results include:  CBC unremarkable CMP with very mildly elevated potassium at 5.3, otherwise at patient's baseline  Imaging Studies ordered:  I ordered imaging studies including  DVT ultrasound of the right foot negative I independently visualized and interpreted imaging and I agree with the radiologist interpretation.   Cardiac Monitoring:  The patient was maintained on a  cardiac monitor.  I personally viewed and interpreted the cardiac monitored which showed an underlying rhythm of: Normal sinus rhythm   ED Course/Re-evaluation: 76 year old male  presents to the ED for evaluation of right foot numbness and pain.  Patient is overall very pleasant, nontoxic and in no acute distress.  Vitals without significant abnormality.  On exam, patient has intact DP pulses of right foot.  No signs of acute infection.  He does have intact sensation of both feet although ability to distinguish between sharp and light is diminished on  the right foot.  Neuro exam otherwise unremarkable.  Labs without significant findings although potassium is slightly high, however I do not think that this could account for his symptoms.  Symptoms not consistent with stroke.  Ultrasound negative for DVT.  He has intact pulses so concern for arterial occlusion is low.  Patient is possibly suffering from a neuropathy.  I do not think that there is an emergent cause of patient's symptoms and he can be discharged home with a low-dose of gabapentin and to follow-up with his PCP.  He can also have his potassium rechecked at that time.  Discussed this case with my attending, Dr. Renaye Rakers who also agrees with plan and that patient is stable for discharge.  Patient expresses understanding and is amenable to plan.  Prescription for antifungal provided.  Disposition:  After consideration of the diagnostic results, physical exam, history and the patients response to treatment feel that the patent would benefit from discharge.   Peripheral neuropathy: Plan and management as described above. Discharged home in good condition. Final Clinical Impression(s) / ED Diagnoses Final diagnoses:  Neuropathy    Rx / DC Orders ED Discharge Orders          Ordered    gabapentin (NEURONTIN) 300 MG capsule        05/07/22 1416              Janell Quiet, [place] 05/07/22 1430    Terald Sleeper, MD 05/07/22  1536

## 2022-05-11 ENCOUNTER — Encounter: Payer: Self-pay | Admitting: Internal Medicine

## 2022-05-11 ENCOUNTER — Ambulatory Visit (INDEPENDENT_AMBULATORY_CARE_PROVIDER_SITE_OTHER): Payer: Medicare Other | Admitting: Internal Medicine

## 2022-05-11 VITALS — BP 136/78 | HR 63 | Temp 98.3°F | Ht 71.0 in | Wt 247.0 lb

## 2022-05-11 DIAGNOSIS — G629 Polyneuropathy, unspecified: Secondary | ICD-10-CM | POA: Diagnosis not present

## 2022-05-11 DIAGNOSIS — M79672 Pain in left foot: Secondary | ICD-10-CM | POA: Diagnosis not present

## 2022-05-11 DIAGNOSIS — I872 Venous insufficiency (chronic) (peripheral): Secondary | ICD-10-CM | POA: Diagnosis not present

## 2022-05-11 DIAGNOSIS — M79671 Pain in right foot: Secondary | ICD-10-CM | POA: Diagnosis not present

## 2022-05-11 DIAGNOSIS — M10071 Idiopathic gout, right ankle and foot: Secondary | ICD-10-CM | POA: Diagnosis not present

## 2022-05-11 MED ORDER — PREDNISONE 10 MG PO TABS: ORAL_TABLET | ORAL | 0 refills | Status: DC

## 2022-05-11 NOTE — Patient Instructions (Addendum)
Medications changes include :  prednisone taper    Your prescription(s) have been sent to your pharmacy.    A referral was ordered for podiatry.     Someone from that office will call you to schedule an appointment.    Return if symptoms worsen or fail to improve.    Gout  Gout is a condition that causes painful swelling of the joints. Gout is a type of inflammation of the joints (arthritis). This condition is caused by having too much uric acid in the body. Uric acid is a chemical that forms when the body breaks down substances called purines. Purines are important for building body proteins. When the body has too much uric acid, sharp crystals can form and build up inside the joints. This causes pain and swelling. Gout attacks can happen quickly and may be very painful (acute gout). Over time, the attacks can affect more joints and become more frequent (chronic gout). Gout can also cause uric acid to build up under the skin and inside the kidneys. What are the causes? This condition is caused by too much uric acid in your blood. This can happen because: Your kidneys do not remove enough uric acid from your blood. This is the most common cause. Your body makes too much uric acid. This can happen with some cancers and cancer treatments. It can also occur if your body is breaking down too many red blood cells (hemolytic anemia). You eat too many foods that are high in purines. These foods include organ meats and some seafood. Alcohol, especially beer, is also high in purines. A gout attack may be triggered by trauma or stress. What increases the risk? The following factors may make you more likely to develop this condition: Having a family history of gout. Being male and middle-aged. Being male and having gone through menopause. Taking certain medicines, including aspirin, cyclosporine, diuretics, levodopa, and niacin. Having an organ transplant. Having certain conditions,  such as: Being obese. Lead poisoning. Kidney disease. A skin condition called psoriasis. Other factors include: Losing weight too quickly. Being dehydrated. Frequently drinking alcohol, especially beer. Frequently drinking beverages that are sweetened with a type of sugar called fructose. What are the signs or symptoms? An attack of acute gout happens quickly. It usually occurs in just one joint. The most common place is the big toe. Attacks often start at night. Other joints that may be affected include joints of the feet, ankle, knee, fingers, wrist, or elbow. Symptoms of this condition may include: Severe pain. Warmth. Swelling. Stiffness. Tenderness. The affected joint may be very painful to touch. Shiny, red, or purple skin. Chills and fever. Chronic gout may cause symptoms more frequently. More joints may be involved. You may also have white or yellow lumps (tophi) on your hands or feet or in other areas near your joints. How is this diagnosed? This condition is diagnosed based on your symptoms, your medical history, and a physical exam. You may have tests, such as: Blood tests to measure uric acid levels. Removal of joint fluid with a thin needle (aspiration) to look for uric acid crystals. X-rays to look for joint damage. How is this treated? Treatment for this condition has two phases: treating an acute attack and preventing future attacks. Acute gout treatment may include medicines to reduce pain and swelling, including: NSAIDs, such as ibuprofen. Steroids. These are strong anti-inflammatory medicines that can be taken by mouth (orally) or injected into a joint. Colchicine. This  medicine relieves pain and swelling when it is taken soon after an attack. It can be given by mouth or through an IV. Preventive treatment may include: Daily use of smaller doses of NSAIDs or colchicine. Use of a medicine that reduces uric acid levels in your blood, such as allopurinol. Changes to  your diet. You may need to see a dietitian about what to eat and drink to prevent gout. Follow these instructions at home: During a gout attack  If directed, put ice on the affected area. To do this: Put ice in a plastic bag. Place a towel between your skin and the bag. Leave the ice on for 20 minutes, 2-3 times a day. Remove the ice if your skin turns bright red. This is very important. If you cannot feel pain, heat, or cold, you have a greater risk of damage to the area. Raise (elevate) the affected joint above the level of your heart as often as possible. Rest the joint as much as possible. If the affected joint is in your leg, you may be given crutches to use. Follow instructions from your health care provider about eating or drinking restrictions. Avoiding future gout attacks Follow a low-purine diet as told by your dietitian or health care provider. Avoid foods and drinks that are high in purines, including liver, kidney, anchovies, asparagus, herring, mushrooms, mussels, and beer. Maintain a healthy weight or lose weight if you are overweight. If you want to lose weight, talk with your health care provider. Do not lose weight too quickly. Start or maintain an exercise program as told by your health care provider. Eating and drinking Avoid drinking beverages that contain fructose. Drink enough fluids to keep your urine pale yellow. If you drink alcohol: Limit how much you have to: 0-1 drink a day for women who are not pregnant. 0-2 drinks a day for men. Know how much alcohol is in a drink. In the [place] one drink equals one 12 oz bottle of beer (355 mL), one 5 oz glass of wine (148 mL), or one 1 oz glass of hard liquor (44 mL). General instructions Take over-the-counter and prescription medicines only as told by your health care provider. Ask your health care provider if the medicine prescribed to you requires you to avoid driving or using machinery. Return to your normal activities  as told by your health care provider. Ask your health care provider what activities are safe for you. Keep all follow-up visits. This is important. Where to find more information [employer]: www.niams.http://www.myers.net/ Contact a health care provider if you have: Another gout attack. Continuing symptoms of a gout attack after 10 days of treatment. Side effects from your medicines. Chills or a fever. Burning pain when you urinate. Pain in your lower back or abdomen. Get help right away if you: Have severe or uncontrolled pain. Cannot urinate. Summary Gout is painful swelling of the joints caused by having too much uric acid in the body. The most common site for gout to occur is in the big toe, but it can affect other joints in the body. Medicines and dietary changes can help to prevent and treat gout attacks. This information is not intended to replace advice given to you by your health care provider. Make sure you discuss any questions you have with your health care provider. Document Revised: 08/19/2021 Document Reviewed: 08/19/2021 Elsevier Patient Education  2023 [employer].

## 2022-05-11 NOTE — Progress Notes (Signed)
Subjective:    Patient ID: [name], male    DOB: 11-03-1946, 76 y.o.   MRN: [id number]      HPI Basilio is here for  Chief Complaint  Patient presents with   swelling    Of right leg and foot, possible infection to big toe    RLE edema, big toe infected  - last week he noticed (wed or Thursday ) - his right foot felt numb.  It started in the lateral ankle and felt like it radiated towards the big toe.  He went to th ED 6/9 - thought he had neuropathy.    Has chronic numbness on the dorsal aspect of his foot.  This area also gets swollen.  [place] in ED neg for DVT  The numbness and discomfort in the lateral ankle has resolved.  Medications and allergies reviewed with patient and updated if appropriate.  Current Outpatient Medications on File Prior to Visit  Medication Sig Dispense Refill   clotrimazole (LOTRIMIN) 1 % cream Apply to affected area 2 times daily 15 g 0   escitalopram (LEXAPRO) 5 MG tablet Take 5 mg by mouth daily.     gabapentin (NEURONTIN) 300 MG capsule Take 1 capsule on day 1, take 1 capsule in the morning and the evening on day 2, then take 1 capsule three times daily going forward 35 capsule 0   indapamide (LOZOL) 1.25 MG tablet Take 1 tablet (1.25 mg total) by mouth daily. 90 tablet 3   irbesartan (AVAPRO) 300 MG tablet Take 300 mg by mouth daily.     ofloxacin (OCUFLOX) 0.3 % ophthalmic solution Place into the left eye.     prednisoLONE acetate (PRED FORTE) 1 % ophthalmic suspension Place into the left eye.     REPATHA SURECLICK 140 MG/ML SOAJ INJECT 140 MG INTO THE SKIN EVERY 14 (FOURTEEN) DAYS. 2 mL 11   sildenafil (VIAGRA) 100 MG tablet Take 100 mg by mouth daily as needed.     Sofosbuvir-Velpatasvir (EPCLUSA) 400-100 MG TABS Take 1 tablet by mouth daily. 28 tablet 2   spironolactone (ALDACTONE) 25 MG tablet Take 1 tablet (25 mg total) by mouth daily. 30 tablet 5   No current facility-administered medications on file prior to visit.    Review  of Systems     Objective:   Vitals:   05/11/22 1606  BP: 136/78  Pulse: 63  Temp: 98.3 F (36.8 C)  SpO2: 98%   BP Readings from Last 3 Encounters:  05/11/22 136/78  05/07/22 (!) 144/70  03/24/22 134/76   Wt Readings from Last 3 Encounters:  05/11/22 247 lb (112 kg)  05/07/22 248 lb (112.5 kg)  03/24/22 246 lb (111.6 kg)   Body mass index is 34.45 kg/m.    Physical Exam Constitutional:      General: He is not in acute distress.    Appearance: Normal appearance.  HENT:     Head: Normocephalic and atraumatic.  Cardiovascular:     Comments: Varicose veins bilateral lower extremities-several in foot and ankle region Musculoskeletal:     Right lower leg: Edema (Mild) present.     Left lower leg: Edema (Mild) present.     Comments: Right first MTP joint with swelling, erythema, warmth and significant tenderness with touch/passive and active movement  Skin:    General: Skin is warm and dry.  Neurological:     Mental Status: He is alert.     Sensory: Sensory deficit (Right distal plantar foot)  present.            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

## 2022-05-21 ENCOUNTER — Ambulatory Visit: Payer: Medicare Other | Admitting: Family

## 2022-05-26 NOTE — Progress Notes (Signed)
Cardiology Office Note:    Date:  05/28/2022   ID:  [id number], Ore July 27, 1946, MRN [id number]  PCP:  Georgina Quint, MD  Cardiologist:  Lesleigh Noe, MD   Referring MD: Georgina Quint, *   Chief Complaint  Patient presents with   Coronary Artery Disease   Hypertension   Hyperlipidemia    History of Present Illness:    [name] is a 76 y.o. male with a hx of CAD s/p CABG x3, with early graft failure and re-do surgery (2006), HTN, HLD, PTSD, obesity with stable chest pain.  He is doing well.  He denies angina.  No breathing difficulty.  No orthopnea.  Denies claudication.  Not smoking.  Past Medical History:  Diagnosis Date   Anginal pain (HCC) 01/2005   Anxiety    Arthritis    "knees"   Chronic lower back pain    Colon polyp    Coronary artery disease    Depression    Hepatitis    "don't know what kind; they treated me for it; was a long time ago" (12/11/2018)   High cholesterol    History of bronchitis    "used to get it alot" (08/25/2012)   Hypertension    Panic attacks    PTSD (post-traumatic stress disorder)    "have been treated in the past" (08/25/2012)    Past Surgical History:  Procedure Laterality Date   CARDIAC CATHETERIZATION  01/2005   CORONARY ARTERY BYPASS GRAFT  01/2005   CABG X3   Shrapnel Left 1969   LLE; left lateral thumb (required grafting)   VARICOSE VEIN SURGERY  1980's   LLE    Current Medications: Current Meds  Medication Sig   clotrimazole (LOTRIMIN) 1 % cream Apply to affected area 2 times daily   escitalopram (LEXAPRO) 5 MG tablet Take 5 mg by mouth daily.   indapamide (LOZOL) 1.25 MG tablet Take 1 tablet (1.25 mg total) by mouth daily.   irbesartan (AVAPRO) 300 MG tablet Take 300 mg by mouth daily.   REPATHA SURECLICK 140 MG/ML SOAJ INJECT 140 MG INTO THE SKIN EVERY 14 (FOURTEEN) DAYS.   sildenafil (VIAGRA) 100 MG tablet Take 100 mg by mouth daily as needed.   Sofosbuvir-Velpatasvir (EPCLUSA) 400-100  MG TABS Take 1 tablet by mouth daily.   spironolactone (ALDACTONE) 25 MG tablet Take 1 tablet (25 mg total) by mouth daily.     Allergies:   Codeine and Vicodin [hydrocodone-acetaminophen]   Social History   Socioeconomic History   Marital status: Divorced    Spouse name: Not on file   Number of children: Not on file   Years of education: Not on file   Highest education level: Not on file  Occupational History   Not on file  Tobacco Use   Smoking status: Former    Packs/day: 2.00    Years: 5.00    Total pack years: 10.00    Types: Cigarettes   Smokeless tobacco: Never   Tobacco comments:    08/25/2012 "quit smoking cigarettes 40 yr ago"  Vaping Use   Vaping Use: Never used  Substance and Sexual Activity   Alcohol use: Not Currently    Alcohol/week: 60.0 standard drinks of alcohol    Types: 60 Standard drinks or equivalent per week    Comment: 12/11/2018 "~ 1 pint of vodka/day"   Drug use: Yes    Types: Marijuana   Sexual activity: Yes  Other Topics Concern  Not on file  Social History Narrative   Not on file   Social Determinants of Health   Financial Resource Strain: Not on file  Food Insecurity: Not on file  Transportation Needs: Not on file  Physical Activity: Not on file  Stress: Not on file  Social Connections: Not on file     Family History: The patient's family history includes Cancer in his brother; Diabetes in his mother; Heart disease in his father; Sleep apnea in his sister. There is no history of Colon cancer, Liver cancer, Pancreatic cancer, Esophageal cancer, or Stomach cancer.  ROS:   Please see the history of present illness.    He is tolerating the recently added losartan without difficulty.  All other systems reviewed and are negative.  EKGs/Labs/Other Studies Reviewed:    The following studies were reviewed today: ECHO 11/2021: IMPRESSIONS     1. Left ventricular ejection fraction, by estimation, is 60 to 65%. The  left ventricle has  normal function. The left ventricle has no regional  wall motion abnormalities. Left ventricular diastolic parameters are  consistent with Grade I diastolic  dysfunction (impaired relaxation).   2. Right ventricular systolic function is normal. The right ventricular  size is normal.   3. Left atrial size was mildly dilated.   4. The mitral valve is abnormal. Trivial mitral valve regurgitation. No  evidence of mitral stenosis.   5. Marked calcification of the non coronary cusp. The aortic valve is  normal in structure. There is moderate calcification of the aortic valve.  There is moderate thickening of the aortic valve. Aortic valve  regurgitation is mild. Aortic valve  sclerosis/calcification is present, without any evidence of aortic  stenosis.   6. Aortic dilatation noted. There is mild dilatation of the ascending  aorta, measuring 39 mm.   7. The inferior vena cava is normal in size with greater than 50%  respiratory variability, suggesting right atrial pressure of 3 mmHg.   EKG:  EKG not repeated today.  Recent Labs: 08/09/2021: B Natriuretic Peptide 86.8 05/07/2022: ALT 15; BUN 34; Creatinine, Ser 1.29; Hemoglobin 12.7; Platelets 107; Potassium 5.3; Sodium 136  Recent Lipid Panel    Component Value Date/Time   CHOL 128 12/03/2021 1407   TRIG 174 (H) 12/03/2021 1407   HDL 44 12/03/2021 1407   CHOLHDL 2.9 12/03/2021 1407   CHOLHDL 3 06/16/2021 1133   VLDL 38.6 06/16/2021 1133   LDLCALC 55 12/03/2021 1407   LDLDIRECT 47 12/03/2021 1407    Physical Exam:    VS:  BP 112/60   Pulse 75   Ht 5\' 11"  (1.803 m)   Wt 246 lb 12.8 oz (111.9 kg)   SpO2 95%   BMI 34.42 kg/m     Wt Readings from Last 3 Encounters:  05/28/22 246 lb 12.8 oz (111.9 kg)  05/11/22 247 lb (112 kg)  05/07/22 248 lb (112.5 kg)     GEN: Overweight with BMI greater than 30.. No acute distress HEENT: Normal NECK: No JVD. LYMPHATICS: No lymphadenopathy CARDIAC: No murmur. RRR no gallop, or  edema. VASCULAR:  Normal Pulses. No bruits. RESPIRATORY:  Clear to auscultation without rales, wheezing or rhonchi  ABDOMEN: Soft, non-tender, non-distended, No pulsatile mass, MUSCULOSKELETAL: No deformity  SKIN: Warm and dry NEUROLOGIC:  Alert and oriented x 3 PSYCHIATRIC:  Normal affect   ASSESSMENT:    1. Coronary artery disease involving coronary bypass graft of native heart without angina pectoris   2. Chronic diastolic heart failure (HCC)  3. Other hyperlipidemia   4. Essential hypertension   5. Chronic alcohol abuse    PLAN:    In order of problems listed above:  Secondary prevention reviewed. No evidence of volume overload on current medical regimen.   Continue Repatha. Excellent blood pressure control since adding Lozol.  Potassium is running 5.3.  He is eating a lot of bananas and eating citrus fruit.  I explained he is on 2 medications that cause him to hold onto potassium and have asked him to significantly decrease foods in his diet not a high potassium including raisins, citrus fruits, and bananas. Not currently drinking.   Medication Adjustments/Labs and Tests Ordered: Current medicines are reviewed at length with the patient today.  Concerns regarding medicines are outlined above.  No orders of the defined types were placed in this encounter.  No orders of the defined types were placed in this encounter.   Patient Instructions  Medication Instructions:  Your physician recommends that you continue on your current medications as directed. Please refer to the Current Medication list given to you today.  *If you need a refill on your cardiac medications before your next appointment, please call your pharmacy*  Lab Work: NONE  Testing/Procedures: NONE  Follow-Up: At [facility], you and your health needs are our priority.  As part of our continuing mission to provide you with exceptional heart care, we have created designated Provider Care Teams.  These  Care Teams include your primary Cardiologist (physician) and Advanced Practice Providers (APPs -  Physician Assistants and Nurse Practitioners) who all work together to provide you with the care you need, when you need it.  Your next appointment:   6-9 month(s) Please call our office at [phone] in September or October to schedule your follow-up appointment with Dr. Tamala Julian due sometime between January and March 2024.  The format for your next appointment:   In Person  Provider:   Sinclair Grooms, MD {   Important Information About Sugar         Signed, Sinclair Grooms, MD  05/28/2022 11:56 AM    [place]

## 2022-05-28 ENCOUNTER — Encounter: Payer: Self-pay | Admitting: Interventional Cardiology

## 2022-05-28 ENCOUNTER — Ambulatory Visit (INDEPENDENT_AMBULATORY_CARE_PROVIDER_SITE_OTHER): Payer: Medicare Other | Admitting: Interventional Cardiology

## 2022-05-28 VITALS — BP 112/60 | HR 75 | Ht 71.0 in | Wt 246.8 lb

## 2022-05-28 DIAGNOSIS — I25810 Atherosclerosis of coronary artery bypass graft(s) without angina pectoris: Secondary | ICD-10-CM | POA: Diagnosis not present

## 2022-05-28 DIAGNOSIS — I10 Essential (primary) hypertension: Secondary | ICD-10-CM | POA: Diagnosis not present

## 2022-05-28 DIAGNOSIS — E7849 Other hyperlipidemia: Secondary | ICD-10-CM

## 2022-05-28 DIAGNOSIS — F1010 Alcohol abuse, uncomplicated: Secondary | ICD-10-CM

## 2022-05-28 DIAGNOSIS — I5032 Chronic diastolic (congestive) heart failure: Secondary | ICD-10-CM

## 2022-05-28 NOTE — Patient Instructions (Signed)
Medication Instructions:  Your physician recommends that you continue on your current medications as directed. Please refer to the Current Medication list given to you today.  *If you need a refill on your cardiac medications before your next appointment, please call your pharmacy*  Lab Work: NONE  Testing/Procedures: NONE  Follow-Up: At [facility], you and your health needs are our priority.  As part of our continuing mission to provide you with exceptional heart care, we have created designated Provider Care Teams.  These Care Teams include your primary Cardiologist (physician) and Advanced Practice Providers (APPs -  Physician Assistants and Nurse Practitioners) who all work together to provide you with the care you need, when you need it.  Your next appointment:   6-9 month(s) Please call our office at [phone] in September or October to schedule your follow-up appointment with Dr. Katrinka Blazing due sometime between January and March 2024.  The format for your next appointment:   In Person  Provider:   Lesleigh Noe, MD {   Important Information About Sugar

## 2022-06-02 DIAGNOSIS — G4733 Obstructive sleep apnea (adult) (pediatric): Secondary | ICD-10-CM | POA: Diagnosis not present

## 2022-06-07 ENCOUNTER — Ambulatory Visit: Payer: Medicare Other | Admitting: Family

## 2022-06-10 NOTE — Telephone Encounter (Signed)
Pt setup on 04/02/22. Has initial compliance appt on 06/14/22. Reviewed data, pt uses most every night but not in compliance with using at least 4 hours at night. I have spoken with pt. He elected to r/s to 06/30/22 at 9:45 AM which will still meet time frame per insurance. Pt aware he has not been using at least 4 hours per night which is required by insurance. Pt states the machine has been waking him in the middle of the night. He will talk to his DME asap. Pt verbalized appreciation for the call.

## 2022-06-10 NOTE — Telephone Encounter (Signed)
Patient's [employer] compliance data, average usage of 2 hours and 56 minutes only, percent use days greater than 4 hours at 23% only.  Average AHI 1.8/h with an average pressure for the 95th percentile at 8 cm.  Range of 5 to 11 cm.  Leak acceptable.  Please encourage full compliance, I will recommend change in pressure at this time.

## 2022-06-14 ENCOUNTER — Ambulatory Visit: Payer: Medicare Other | Admitting: Neurology

## 2022-06-19 ENCOUNTER — Emergency Department (HOSPITAL_BASED_OUTPATIENT_CLINIC_OR_DEPARTMENT_OTHER)
Admission: EM | Admit: 2022-06-19 | Discharge: 2022-06-19 | Disposition: A | Discharge status: Home / Self Care | Payer: Medicare Other | Attending: Emergency Medicine | Admitting: Emergency Medicine

## 2022-06-19 ENCOUNTER — Other Ambulatory Visit: Payer: Self-pay | Admitting: Internal Medicine

## 2022-06-19 ENCOUNTER — Other Ambulatory Visit: Payer: Self-pay | Admitting: Emergency Medicine

## 2022-06-19 ENCOUNTER — Encounter (HOSPITAL_BASED_OUTPATIENT_CLINIC_OR_DEPARTMENT_OTHER): Payer: Self-pay | Admitting: Emergency Medicine

## 2022-06-19 ENCOUNTER — Other Ambulatory Visit: Payer: Self-pay

## 2022-06-19 DIAGNOSIS — M109 Gout, unspecified: Secondary | ICD-10-CM

## 2022-06-19 DIAGNOSIS — M1A2520 Drug-induced chronic gout, left hip, without tophus (tophi): Secondary | ICD-10-CM | POA: Diagnosis not present

## 2022-06-19 DIAGNOSIS — I2510 Atherosclerotic heart disease of native coronary artery without angina pectoris: Secondary | ICD-10-CM

## 2022-06-19 MED ORDER — OXYCODONE-ACETAMINOPHEN 5-325 MG PO TABS: 1.0000 | ORAL_TABLET | ORAL | 0 refills | Status: DC | PRN

## 2022-06-19 MED ORDER — PREDNISONE 50 MG PO TABS: ORAL_TABLET | ORAL | 0 refills | Status: DC

## 2022-06-19 NOTE — ED Provider Notes (Signed)
[facility] EMERGENCY DEPT Provider Note   CSN: [id number] Arrival date & time: 06/19/22  1513     History  No chief complaint on file.   [name] is a 76 y.o. male.  Pt complains of an exacerbation of gout in his left hip.  Pt request rx for prednisone.  Pt called his MD and they could not call in today.  Pt reports gout in the same area that he has had gout before.  Pt reports he has been eating tomatoes and that makes his gout flare up.    The history is provided by the patient. No language interpreter was used.       Home Medications Prior to Admission medications   Medication Sig Start Date End Date Taking? Authorizing Provider  oxyCODONE-acetaminophen (PERCOCET) 5-325 MG tablet Take 1 tablet by mouth every 4 (four) hours as needed for severe pain. 06/19/22  Yes Cheron Schaumann K, PA-C  predniSONE (DELTASONE) 50 MG tablet One tablet a day 06/19/22  Yes Elson Areas, PA-C  clotrimazole (LOTRIMIN) 1 % cream Apply to affected area 2 times daily 05/07/22   Janell Quiet, PA-C  escitalopram (LEXAPRO) 5 MG tablet Take 5 mg by mouth daily. 11/09/21   [provider]  indapamide (LOZOL) 1.25 MG tablet Take 1 tablet (1.25 mg total) by mouth daily. 12/07/21   Lyn Records, MD  irbesartan (AVAPRO) 300 MG tablet Take 300 mg by mouth daily.    [provider]  REPATHA SURECLICK 140 MG/ML SOAJ INJECT 140 MG INTO THE SKIN EVERY 14 (FOURTEEN) DAYS. 03/29/22   Lyn Records, MD  sildenafil (VIAGRA) 100 MG tablet Take 100 mg by mouth daily as needed. 06/17/21   [provider]  Sofosbuvir-Velpatasvir (EPCLUSA) 400-100 MG TABS Take 1 tablet by mouth daily. 02/04/22   Jennette Kettle, RPH-CPP  spironolactone (ALDACTONE) 25 MG tablet Take 1 tablet (25 mg total) by mouth daily. 12/03/21   Lyn Records, MD      Allergies    Codeine and Vicodin [hydrocodone-acetaminophen]    Review of Systems   Review of Systems  Respiratory:  Negative for cough.    All other systems reviewed and are negative.   Physical Exam Updated Vital Signs BP 122/70 (BP Location: Left Arm)   Pulse 76   Temp 99.3 F (37.4 C)   Resp 16   Ht 5\' 11"  (1.803 m)   Wt 111.6 kg   SpO2 99%   BMI 34.31 kg/m  Physical Exam Vitals and nursing note reviewed.  Constitutional:      Appearance: He is well-developed.  HENT:     Head: Normocephalic.  Pulmonary:     Effort: Pulmonary effort is normal.  Abdominal:     General: There is no distension.  Musculoskeletal:        General: Normal range of motion.     Cervical back: Normal range of motion.  Skin:    General: Skin is warm.  Neurological:     Mental Status: He is alert and oriented to person, place, and time.  Psychiatric:        Mood and Affect: Mood normal.     ED Results / Procedures / Treatments   Labs (all labs ordered are listed, but only abnormal results are displayed) Labs Reviewed - No data to display  EKG None  Radiology No results found.  Procedures Procedures    Medications Ordered in ED Medications - No data to display  ED Course/ Medical Decision Making/ A&P                           Medical Decision Making Pt complains of gout.   Risk Prescription drug management. Risk Details: Pt has a history of gout.  Pt reports he needs prednisone.  Pt given rx for prednisone and 10 percocet tablets            Final Clinical Impression(s) / ED Diagnoses Final diagnoses:  Acute gout of left hip, unspecified cause    Rx / DC Orders ED Discharge Orders          Ordered    predniSONE (DELTASONE) 50 MG tablet        06/19/22 1753    oxyCODONE-acetaminophen (PERCOCET) 5-325 MG tablet  Every 4 hours PRN        06/19/22 1757              Osie Cheeks 06/19/22 1805    Rolan Bucco, MD 06/19/22 2325

## 2022-06-19 NOTE — ED Triage Notes (Signed)
Pt here with c/o gout pain in his left hip , hx of same , states that prednisone usually works but cant see his MD until Monday

## 2022-06-26 ENCOUNTER — Telehealth: Payer: Self-pay | Admitting: Cardiology

## 2022-06-26 ENCOUNTER — Encounter (HOSPITAL_COMMUNITY): Payer: Self-pay

## 2022-06-26 ENCOUNTER — Ambulatory Visit (HOSPITAL_COMMUNITY)
Admission: EM | Admit: 2022-06-26 | Discharge: 2022-06-26 | Disposition: A | Discharge status: Home / Self Care | Payer: Medicare Other | Attending: Physician Assistant | Admitting: Physician Assistant

## 2022-06-26 DIAGNOSIS — Z113 Encounter for screening for infections with a predominantly sexual mode of transmission: Secondary | ICD-10-CM | POA: Diagnosis not present

## 2022-06-26 DIAGNOSIS — R079 Chest pain, unspecified: Secondary | ICD-10-CM | POA: Diagnosis not present

## 2022-06-26 DIAGNOSIS — R011 Cardiac murmur, unspecified: Secondary | ICD-10-CM

## 2022-06-26 DIAGNOSIS — N489 Disorder of penis, unspecified: Secondary | ICD-10-CM | POA: Diagnosis present

## 2022-06-26 LAB — HIV ANTIBODY (ROUTINE TESTING W REFLEX): HIV Screen 4th Generation wRfx: NONREACTIVE

## 2022-06-26 MED ORDER — VALACYCLOVIR HCL 500 MG PO TABS: 500.0000 mg | ORAL_TABLET | Freq: Two times a day (BID) | ORAL | 0 refills | Status: DC

## 2022-06-26 NOTE — Discharge Instructions (Signed)
I believe that your symptoms are related to herpes.  Please take Valtrex twice daily for 7 days.  We will contact you if any of your other testing is positive you will need to arrange treatment.  Please monitor your MyChart for these results.  Abstain from sex until you receive results.  If you develop any symptoms please return for reevaluation.  We did notice a murmur on your exam.  I recommend you follow-up with your primary care so that they can evaluate you further.  If you have any shortness of breath or chest pain please go to the emergency room.

## 2022-06-26 NOTE — ED Notes (Signed)
Provider swabbed a lesion in genital area, this nurse at bedside for sample

## 2022-06-26 NOTE — ED Triage Notes (Signed)
Pt states sores to his genital for the past 2 weeks.

## 2022-06-26 NOTE — ED Provider Notes (Addendum)
[facility]    CSN: [id number] Arrival date & time: 06/26/22  1420      History   Chief Complaint Chief Complaint  Patient presents with   genital sores    HPI [name] is a 76 y.o. male.   Patient presents today with a 2-week history of sores on his penis.  He reports that these are painful along his penile shaft with a possible lymph node in the right groin.  He denies any penile discharge, dysuria, abdominal pain, pelvic, fever.  Does report that he has a history of herpes but does not require treatment.  He is requesting complete STI testing as he wants to rule out any additional infections.  He denies any recent antibiotics.    Past Medical History:  Diagnosis Date   Anginal pain (HCC) 01/2005   Anxiety    Arthritis    "knees"   Chronic lower back pain    Colon polyp    Coronary artery disease    Depression    Hepatitis    "don't know what kind; they treated me for it; was a long time ago" (12/11/2018)   High cholesterol    History of bronchitis    "used to get it alot" (08/25/2012)   Hypertension    Panic attacks    PTSD (post-traumatic stress disorder)    "have been treated in the past" (08/25/2012)    Patient Active Problem List   Diagnosis Date Noted   Acute idiopathic gout involving toe of right foot 05/11/2022   Acute sinus infection 03/24/2022   Urinary frequency 03/24/2022   Low back pain 03/24/2022   AKI (acute kidney injury) (HCC) 03/24/2022   Chronic hepatitis C without hepatic coma (HCC) 01/26/2022   Healthcare maintenance 01/26/2022   Atherosclerosis of aorta (HCC) 06/16/2021   Hypertensive heart disease with chronic diastolic congestive heart failure (HCC) 06/16/2021   Alcoholic cirrhosis of liver without ascites (HCC) 06/16/2021   Calculus of gallbladder without cholecystitis without obstruction 06/16/2021   Chronic diastolic heart failure (HCC) 12/11/2020   Alcohol abuse with alcohol-induced mood disorder (HCC) 06/16/2019    PTSD (post-traumatic stress disorder) 06/16/2019   Thrombocytopenia (HCC) 02/24/2019   Chronic alcohol abuse 12/09/2015   Coronary artery disease involving coronary bypass graft of native heart without angina pectoris 08/07/2014   Essential hypertension 08/07/2014   Dyslipidemia 08/07/2014    Past Surgical History:  Procedure Laterality Date   CARDIAC CATHETERIZATION  01/2005   CORONARY ARTERY BYPASS GRAFT  01/2005   CABG X3   Shrapnel Left 1969   LLE; left lateral thumb (required grafting)   VARICOSE VEIN SURGERY  1980's   LLE       Home Medications    Prior to Admission medications   Medication Sig Start Date End Date Taking? Authorizing Provider  valACYclovir (VALTREX) 500 MG tablet Take 1 tablet (500 mg total) by mouth 2 (two) times daily. 06/26/22  Yes Lysa Livengood, Noberto Retort, PA-C  clotrimazole (LOTRIMIN) 1 % cream Apply to affected area 2 times daily 05/07/22   Janell Quiet, PA-C  escitalopram (LEXAPRO) 5 MG tablet Take 5 mg by mouth daily. 11/09/21   [provider]  indapamide (LOZOL) 1.25 MG tablet Take 1 tablet (1.25 mg total) by mouth daily. 12/07/21   Lyn Records, MD  irbesartan (AVAPRO) 300 MG tablet Take 300 mg by mouth daily.    [provider]  oxyCODONE-acetaminophen (PERCOCET) 5-325 MG tablet Take 1 tablet by mouth every 4 (  four) hours as needed for severe pain. 06/19/22   Elson Areas, PA-C  predniSONE (DELTASONE) 50 MG tablet One tablet a day 06/19/22   Elson Areas, PA-C  REPATHA SURECLICK 140 MG/ML SOAJ INJECT 140 MG INTO THE SKIN EVERY 14 (FOURTEEN) DAYS. 03/29/22   Lyn Records, MD  sildenafil (VIAGRA) 100 MG tablet Take 100 mg by mouth daily as needed. 06/17/21   [provider]  Sofosbuvir-Velpatasvir (EPCLUSA) 400-100 MG TABS Take 1 tablet by mouth daily. 02/04/22   Jennette Kettle, RPH-CPP  spironolactone (ALDACTONE) 25 MG tablet Take 1 tablet (25 mg total) by mouth daily. 12/03/21   Lyn Records, MD    Family History Family  History  Problem Relation Age of Onset   Diabetes Mother    Heart disease Father    Sleep apnea Sister    Cancer Brother    Colon cancer Neg Hx    Liver cancer Neg Hx    Pancreatic cancer Neg Hx    Esophageal cancer Neg Hx    Stomach cancer Neg Hx     Social History Social History   Tobacco Use   Smoking status: Former    Packs/day: 2.00    Years: 5.00    Total pack years: 10.00    Types: Cigarettes   Smokeless tobacco: Never   Tobacco comments:    08/25/2012 "quit smoking cigarettes 40 yr ago"  Vaping Use   Vaping Use: Never used  Substance Use Topics   Alcohol use: Not Currently    Alcohol/week: 60.0 standard drinks of alcohol    Types: 60 Standard drinks or equivalent per week    Comment: 12/11/2018 "~ 1 pint of vodka/day"   Drug use: Yes    Types: Marijuana     Allergies   Codeine and Vicodin [hydrocodone-acetaminophen]   Review of Systems Review of Systems  Constitutional:  Negative for activity change, appetite change, fatigue and fever.  Gastrointestinal:  Negative for abdominal pain, diarrhea, nausea and vomiting.  Genitourinary:  Positive for genital sores. Negative for dysuria, frequency, penile discharge, penile pain, penile swelling, scrotal swelling and urgency.     Physical Exam Triage Vital Signs ED Triage Vitals  Enc Vitals Group     BP 06/26/22 1448 133/61     Pulse Rate 06/26/22 1445 63     Resp 06/26/22 1445 16     Temp 06/26/22 1445 99.1 F (37.3 C)     Temp Source 06/26/22 1445 Oral     SpO2 06/26/22 1445 99 %     Weight --      Height --      Head Circumference --      Peak Flow --      Pain Score 06/26/22 1447 7     Pain Loc --      Pain Edu? --      Excl. in GC? --    No data found.  Updated Vital Signs BP (!) 149/53 (BP Location: Left Arm) Comment (BP Location): large cuff  Pulse 70   Temp 99.1 F (37.3 C) (Oral)   Resp 20   SpO2 97%   Visual Acuity Right Eye Distance:   Left Eye Distance:   Bilateral Distance:     Right Eye Near:   Left Eye Near:    Bilateral Near:     Physical Exam Vitals reviewed. Exam conducted with a chaperone present.  Constitutional:      General: He is awake.  Appearance: Normal appearance. He is well-developed. He is not ill-appearing.     Comments: Male who appears stated age in no acute distress sitting comfortably in exam room.  HENT:     Head: Normocephalic and atraumatic.  Cardiovascular:     Rate and Rhythm: Normal rate and regular rhythm.     Heart sounds: S1 normal and S2 normal. Murmur heard.  Pulmonary:     Effort: Pulmonary effort is normal.     Breath sounds: Normal breath sounds. No stridor. No wheezing, rhonchi or rales.     Comments: Clear to auscultation bilaterally Abdominal:     General: Bowel sounds are normal.     Palpations: Abdomen is soft.     Tenderness: There is no abdominal tenderness.     Comments: Benign abdominal exam  Genitourinary:    Penis: Circumcised. Lesions present.      Comments: Kim, RN present as chaperone during exam.  Ulcerations noted along penile shaft. Neurological:     Mental Status: He is alert.  Psychiatric:        Behavior: Behavior is cooperative.      UC Treatments / Results  Labs (all labs ordered are listed, but only abnormal results are displayed) Labs Reviewed  HSV CULTURE AND TYPING  HIV ANTIBODY (ROUTINE TESTING W REFLEX)  RPR  CYTOLOGY, (ORAL, ANAL, URETHRAL) ANCILLARY ONLY    EKG   Radiology No results found.  Procedures Procedures (including critical care time)  Medications Ordered in UC Medications - No data to display  Initial Impression / Assessment and Plan / UC Course  I have reviewed the triage vital signs and the nursing notes.  Pertinent labs & imaging results that were available during my care of the patient were reviewed by me and considered in my medical decision making (see chart for details).     Discussed that symptoms are most consistent with herpes.  We will  start Valtrex 500 mg twice daily for a week.  HSV swab collected today-results pending.  STI swab as well as blood testing for HIV/syphilis obtained today-results pending.  We will make additional recommendations based on laboratory findings.  He is to return with any worsening symptoms.  Patient was noted to have murmur on exam.  There is no record of this in his chart.  Discussed that he should follow-up with his PCP to consider referral to cardiology/echocardiogram.  Addendum: At the end of the visit as nurse was reviewing instruction patient asked whether or not murmur that was noticed on exam could cause chest pain.  Upon further questioning he reported current chest pain that started today.  Pain is rated 3/4 on a 0-10 pain scale, described as sharp, no aggravating relieving factors.  Denies any associated shortness of breath, palpitations, nausea, vomiting, lightheadedness.  EKG obtained showed sinus bradycardia with ventricular rate of 59 bpm and T wave inversion in lead I and later R wave progression compared to 12/07/2021 tracing.  Discussed that given he does have an active pain he should go to the emergency room for further evaluation and management.  Patient declined this initially we discussed potentially using GI cocktail to see if acid reflux could be contributing as he did feel that his stomach was bloated and he needed to use the bathroom.  He declined to do this and reported that he would follow-up with his primary care.  Discussed that my recommendation is to go to the emergency room to which he expressed understanding.  After further consideration  he did agree to go to the ER for further evaluation and management of active chest pain.  Vital signs were stable at the time of discharge and he was safe for private transport.  Final Clinical Impressions(s) / UC Diagnoses   Final diagnoses:  Penile lesion  Routine screening for STI (sexually transmitted infection)  Newly recognized murmur   Chest pain, unspecified type     Discharge Instructions      I believe that your symptoms are related to herpes.  Please take Valtrex twice daily for 7 days.  We will contact you if any of your other testing is positive you will need to arrange treatment.  Please monitor your MyChart for these results.  Abstain from sex until you receive results.  If you develop any symptoms please return for reevaluation.  We did notice a murmur on your exam.  I recommend you follow-up with your primary care so that they can evaluate you further.  If you have any shortness of breath or chest pain please go to the emergency room.     ED Prescriptions     Medication Sig Dispense Auth. Provider   valACYclovir (VALTREX) 500 MG tablet Take 1 tablet (500 mg total) by mouth 2 (two) times daily. 14 tablet Tressie Ragin, Noberto Retort, PA-C      PDMP not reviewed this encounter.   Jeani Hawking, PA-C 06/26/22 1535    Markeshia Giebel, Noberto Retort, PA-C 06/26/22 1610

## 2022-06-26 NOTE — ED Notes (Signed)
On discharge, reviewed provider instructions with patient .  At this point, patient asked if this could cause chest pain.  Reports he has had a little, as he touched center chest.  Left room to inform provider.

## 2022-06-26 NOTE — Telephone Encounter (Signed)
Patient called stating that he went to Urgent care with a non cardiac problem and he was noted to have a systolic heart murmur and decided to do an EKG and was told it was different from prior EKG. he was told to go to the ER to have his EKG evaluated.  He went home and called our answering service.  I have compared his EKG  with the EKGs done in both January 2023 in September 2022.  He has a nonspecific T wave abnormality and there is no change compared to his EKGs.  I also reviewed his 2D echo from January which showed aortic valve sclerosis and mild AI which accounts for his heart murmur.  I reassured the patient and he will follow-up with Dr. Katrinka Blazing at his next scheduled appointment unless he has problems

## 2022-06-26 NOTE — ED Notes (Signed)
At bedside for Ronald Ou, PA exam of patient's genital complaint

## 2022-06-27 LAB — RPR: RPR Ser Ql: NONREACTIVE

## 2022-06-28 ENCOUNTER — Ambulatory Visit (INDEPENDENT_AMBULATORY_CARE_PROVIDER_SITE_OTHER): Payer: Medicare Other | Admitting: Internal Medicine

## 2022-06-28 ENCOUNTER — Encounter: Payer: Self-pay | Admitting: Internal Medicine

## 2022-06-28 VITALS — BP 146/76 | HR 73 | Temp 98.5°F | Ht 71.0 in | Wt 245.0 lb

## 2022-06-28 DIAGNOSIS — R300 Dysuria: Secondary | ICD-10-CM | POA: Diagnosis not present

## 2022-06-28 DIAGNOSIS — B009 Herpesviral infection, unspecified: Secondary | ICD-10-CM | POA: Insufficient documentation

## 2022-06-28 MED ORDER — VALACYCLOVIR HCL 500 MG PO TABS
500.0000 mg | ORAL_TABLET | Freq: Two times a day (BID) | ORAL | 0 refills | Status: DC
Start: 2022-06-28 — End: 2022-06-28

## 2022-06-28 MED ORDER — OXYCODONE HCL 5 MG PO TABS: 5.0000 mg | ORAL_TABLET | Freq: Four times a day (QID) | ORAL | 0 refills | Status: DC | PRN

## 2022-06-28 MED ORDER — VALACYCLOVIR HCL 500 MG PO TABS
500.0000 mg | ORAL_TABLET | Freq: Two times a day (BID) | ORAL | 1 refills | Status: DC
Start: 2022-06-28 — End: 2022-07-14

## 2022-06-28 NOTE — Progress Notes (Signed)
Subjective:  Patient ID: [name], male    DOB: December 01, 1945  Age: 76 y.o. MRN: [id number]  CC: No chief complaint on file.   HPI Anis Degidio Roberg presents for 2 wks of penile lesions - now on Valtrex  Outpatient Medications Prior to Visit  Medication Sig Dispense Refill   clotrimazole (LOTRIMIN) 1 % cream Apply to affected area 2 times daily 15 g 0   escitalopram (LEXAPRO) 5 MG tablet Take 5 mg by mouth daily.     furosemide (LASIX) 20 MG tablet Take 40 mg by mouth daily as needed.     indapamide (LOZOL) 1.25 MG tablet Take 1 tablet (1.25 mg total) by mouth daily. 90 tablet 3   irbesartan (AVAPRO) 300 MG tablet Take 300 mg by mouth daily.     predniSONE (DELTASONE) 50 MG tablet One tablet a day 6 tablet 0   REPATHA SURECLICK 010 MG/ML SOAJ INJECT 140 MG INTO THE SKIN EVERY 14 (FOURTEEN) DAYS. 2 mL 11   sildenafil (VIAGRA) 100 MG tablet Take 100 mg by mouth daily as needed.     Sofosbuvir-Velpatasvir (EPCLUSA) 400-100 MG TABS Take 1 tablet by mouth daily. 28 tablet 2   spironolactone (ALDACTONE) 25 MG tablet Take 1 tablet (25 mg total) by mouth daily. 30 tablet 5   oxyCODONE-acetaminophen (PERCOCET) 5-325 MG tablet Take 1 tablet by mouth every 4 (four) hours as needed for severe pain. 10 tablet 0   valACYclovir (VALTREX) 500 MG tablet Take 1 tablet (500 mg total) by mouth 2 (two) times daily. 14 tablet 0   No facility-administered medications prior to visit.    ROS: Review of Systems  Objective:  BP (!) 146/76 (BP Location: Left Arm, Patient Position: Sitting, Cuff Size: Large)   Pulse 73   Temp 98.5 F (36.9 C) (Oral)   Ht 5\' 11"  (1.803 m)   Wt 245 lb (111.1 kg)   SpO2 99%   BMI 34.17 kg/m   BP Readings from Last 3 Encounters:  06/28/22 (!) 146/76  06/26/22 (!) 149/53  06/19/22 122/70    Wt Readings from Last 3 Encounters:  06/28/22 245 lb (111.1 kg)  06/19/22 246 lb (111.6 kg)  05/28/22 246 lb 12.8 oz (111.9 kg)    Physical Exam  Lab Results  Component  Value Date   WBC 6.7 05/07/2022   HGB 12.7 (L) 05/07/2022   HCT 38.8 (L) 05/07/2022   PLT 107 (L) 05/07/2022   GLUCOSE 113 (H) 05/07/2022   CHOL 128 12/03/2021   TRIG 174 (H) 12/03/2021   HDL 44 12/03/2021   LDLDIRECT 47 12/03/2021   LDLCALC 55 12/03/2021   ALT 15 05/07/2022   AST 27 05/07/2022   NA 136 05/07/2022   K 5.3 (H) 05/07/2022   CL 103 05/07/2022   CREATININE 1.29 (H) 05/07/2022   BUN 34 (H) 05/07/2022   CO2 24 05/07/2022   INR 1.1 (H) 12/30/2021   HGBA1C 5.9 (H) 09/17/2021    No results found.  Assessment & Plan:   Problem List Items Addressed This Visit     Dysuria - Primary   Relevant Orders   Urinalysis   GC/Chlamydia Probe Amp   HSV infection   Relevant Medications   valACYclovir (VALTREX) 500 MG tablet      Meds ordered this encounter  Medications   DISCONTD: valACYclovir (VALTREX) 500 MG tablet    Sig: Take 1 tablet (500 mg total) by mouth 2 (two) times daily.    Dispense:  14  tablet    Refill:  0   oxyCODONE (ROXICODONE) 5 MG immediate release tablet    Sig: Take 1 tablet (5 mg total) by mouth every 6 (six) hours as needed for severe pain.    Dispense:  10 tablet    Refill:  0   valACYclovir (VALTREX) 500 MG tablet    Sig: Take 1 tablet (500 mg total) by mouth 2 (two) times daily.    Dispense:  14 tablet    Refill:  1      Follow-up: No follow-ups on file.  Walker Kehr, MD

## 2022-06-29 LAB — URINALYSIS
Bilirubin Urine: NEGATIVE
Hgb urine dipstick: NEGATIVE
Ketones, ur: NEGATIVE
Leukocytes,Ua: NEGATIVE
Nitrite: NEGATIVE
Specific Gravity, Urine: 1.015 (ref 1.000–1.030)
Total Protein, Urine: NEGATIVE
Urine Glucose: NEGATIVE
Urobilinogen, UA: 1 (ref 0.0–1.0)
pH: 6 (ref 5.0–8.0)

## 2022-06-29 LAB — HSV CULTURE AND TYPING

## 2022-06-30 ENCOUNTER — Ambulatory Visit (INDEPENDENT_AMBULATORY_CARE_PROVIDER_SITE_OTHER): Payer: Medicare Other | Admitting: Adult Health

## 2022-06-30 ENCOUNTER — Ambulatory Visit: Payer: Medicare Other | Admitting: Family Medicine

## 2022-06-30 ENCOUNTER — Ambulatory Visit: Payer: Medicare Other | Admitting: Neurology

## 2022-06-30 ENCOUNTER — Encounter: Payer: Self-pay | Admitting: Neurology

## 2022-06-30 ENCOUNTER — Encounter: Payer: Self-pay | Admitting: Adult Health

## 2022-06-30 VITALS — BP 134/65 | HR 62 | Ht 71.0 in | Wt 248.4 lb

## 2022-06-30 DIAGNOSIS — Z9989 Dependence on other enabling machines and devices: Secondary | ICD-10-CM

## 2022-06-30 DIAGNOSIS — G4733 Obstructive sleep apnea (adult) (pediatric): Secondary | ICD-10-CM

## 2022-06-30 LAB — GC/CHLAMYDIA PROBE AMP
Chlamydia trachomatis, NAA: NEGATIVE
Neisseria Gonorrhoeae by PCR: NEGATIVE

## 2022-06-30 NOTE — Progress Notes (Signed)
[facility] [address] [place]. [place] [address] [phone]       OFFICE FOLLOW UP NOTE  Mr. RISHARD DELANGE Date of Birth:  [birth year] Medical Record Number:  [id number]   Reason for visit: Initial CPAP follow-up    SUBJECTIVE:   CHIEF COMPLAINT:  Chief Complaint  Patient presents with   Obstructive Sleep Apnea    Pt is well. Room 3 alone    HPI:   Update 06/30/2022 JM: Patient is being seen for initial compliance visit.  CPAP initiated 5/5.  Review of compliance report shows suboptimal usage although optimal residual AHI.  Reports recently making adjustments to his CPAP mask and has been able to tolerate better for longer duration of time.  As noted in compliance report as below, he has been consistently using his CPAP machine greater than 4 hours nightly for the past 2 weeks.  He has already noticed some improvement in regards to his daytime fatigue.  He does work long hours outside as a Merchandiser, retail and believes this could be contributing some to his fatigue.  Epworth Sleepiness Scale 18/24 (prior 19/24).        Consult visit 01/19/2022 Dr. Frances Furbish:  Mr. Cantave is a 76 year old right-handed gentleman with an underlying medical history of aortic atherosclerosis, carotid artery stenosis, chronic diastolic congestive heart failure, alcohol use disorder, liver cirrhosis, thrombocytopenia, coronary artery disease with status post three-vessel bypass in 2006, chronic hepatitis C, hypertension, hyperlipidemia, anxiety, arthritis, low back pain, and obesity, who reports snoring and excessive daytime somnolence.  I reviewed your office note from 12/09/2021.  His Epworth sleepiness score is 19 out of 24, fatigue severity score is 40 out of 63.  He is divorced and lives alone.  He has older children from previous marriage and has younger children from his recent marriage, his daughter is 90 and his son is 8.  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  He reports a  variable sleep schedule, generally in bed between 9 and 11 PM and rise time varies. His sleep is interrupted, he sleeps about 3-4 hours at a time.  He has nocturia about 2-3 times per average night and has had some morning headaches.  His sister has sleep apnea but he is not sure if she actually has a CPAP machine.  He quit smoking over 50 years ago.  He drinks caffeine in the form of tea, 1 or 2 glasses/day on average.  He has not had any alcohol for over 1 year.     ROS:   14 system review of systems performed and negative with exception of those listed in HPI  PMH:  Past Medical History:  Diagnosis Date   Anginal pain (HCC) 01/2005   Anxiety    Arthritis    "knees"   Chronic lower back pain    Colon polyp    Coronary artery disease    Depression    Hepatitis    "don't know what kind; they treated me for it; was a long time ago" (12/11/2018)   High cholesterol    History of bronchitis    "used to get it alot" (08/25/2012)   Hypertension    Panic attacks    PTSD (post-traumatic stress disorder)    "have been treated in the past" (08/25/2012)    PSH:  Past Surgical History:  Procedure Laterality Date   CARDIAC CATHETERIZATION  01/2005   CORONARY ARTERY BYPASS GRAFT  01/2005   CABG X3   Shrapnel Left  1969   LLE; left lateral thumb (required grafting)   VARICOSE VEIN SURGERY  1980's   LLE    Social History:  Social History   Socioeconomic History   Marital status: Divorced    Spouse name: Not on file   Number of children: Not on file   Years of education: Not on file   Highest education level: Not on file  Occupational History   Not on file  Tobacco Use   Smoking status: Former    Packs/day: 2.00    Years: 5.00    Total pack years: 10.00    Types: Cigarettes   Smokeless tobacco: Never   Tobacco comments:    08/25/2012 "quit smoking cigarettes 40 yr ago"  Vaping Use   Vaping Use: Never used  Substance and Sexual Activity   Alcohol use: Not Currently     Alcohol/week: 60.0 standard drinks of alcohol    Types: 60 Standard drinks or equivalent per week    Comment: 12/11/2018 "~ 1 pint of vodka/day"   Drug use: Yes    Types: Marijuana   Sexual activity: Yes  Other Topics Concern   Not on file  Social History Narrative   Not on file   Social Determinants of Health   Financial Resource Strain: Not on file  Food Insecurity: Not on file  Transportation Needs: Not on file  Physical Activity: Not on file  Stress: Not on file  Social Connections: Not on file  Intimate Partner Violence: Not on file    Family History:  Family History  Problem Relation Age of Onset   Diabetes Mother    Heart disease Father    Sleep apnea Sister    Cancer Brother    Colon cancer Neg Hx    Liver cancer Neg Hx    Pancreatic cancer Neg Hx    Esophageal cancer Neg Hx    Stomach cancer Neg Hx     Medications:   Current Outpatient Medications on File Prior to Visit  Medication Sig Dispense Refill   clotrimazole (LOTRIMIN) 1 % cream Apply to affected area 2 times daily 15 g 0   escitalopram (LEXAPRO) 5 MG tablet Take 5 mg by mouth daily.     furosemide (LASIX) 20 MG tablet Take 40 mg by mouth daily as needed.     indapamide (LOZOL) 1.25 MG tablet Take 1 tablet (1.25 mg total) by mouth daily. 90 tablet 3   irbesartan (AVAPRO) 300 MG tablet Take 300 mg by mouth daily.     oxyCODONE (ROXICODONE) 5 MG immediate release tablet Take 1 tablet (5 mg total) by mouth every 6 (six) hours as needed for severe pain. 10 tablet 0   predniSONE (DELTASONE) 50 MG tablet One tablet a day 6 tablet 0   REPATHA SURECLICK 140 MG/ML SOAJ INJECT 140 MG INTO THE SKIN EVERY 14 (FOURTEEN) DAYS. 2 mL 11   sildenafil (VIAGRA) 100 MG tablet Take 100 mg by mouth daily as needed.     Sofosbuvir-Velpatasvir (EPCLUSA) 400-100 MG TABS Take 1 tablet by mouth daily. 28 tablet 2   spironolactone (ALDACTONE) 25 MG tablet Take 1 tablet (25 mg total) by mouth daily. 30 tablet 5   valACYclovir  (VALTREX) 500 MG tablet Take 1 tablet (500 mg total) by mouth 2 (two) times daily. 14 tablet 1   No current facility-administered medications on file prior to visit.    Allergies:   Allergies  Allergen Reactions   Codeine Palpitations   Vicodin [Hydrocodone-Acetaminophen] Nausea  And Vomiting, Swelling and Palpitations      OBJECTIVE:  Physical Exam  Vitals:   06/30/22 1033  BP: 134/65  Pulse: 62  Weight: 248 lb 6 oz (112.7 kg)  Height: 5\' 11"  (1.803 m)   Body mass index is 34.64 kg/m. No results found.   General: well developed, well nourished, very pleasant elderly African-American male, seated, in no evident distress Head: head normocephalic and atraumatic.   Neck: supple with no carotid or supraclavicular bruits Cardiovascular: regular rate and rhythm, no murmurs Musculoskeletal: no deformity Skin:  no rash/petichiae Vascular:  Normal pulses all extremities   Neurologic Exam Mental Status: Awake and fully alert. Oriented to place and time. Recent and remote memory intact. Attention span, concentration and fund of knowledge appropriate. Mood and affect appropriate.  Cranial Nerves: Pupils equal, briskly reactive to light. Extraocular movements full without nystagmus. Visual fields full to confrontation. Hearing intact. Facial sensation intact. Face, tongue, palate moves normally and symmetrically.  Motor: Normal bulk and tone. Normal strength in all tested extremity muscles Sensory.: intact to touch , pinprick , position and vibratory sensation.  Coordination: Rapid alternating movements normal in all extremities. Finger-to-nose and heel-to-shin performed accurately bilaterally. Gait and Station: Arises from chair without difficulty. Stance is normal. Gait demonstrates normal stride length and balance without use of AD. Tandem walk and heel toe without difficulty.  Reflexes: 1+ and symmetric. Toes downgoing.         ASSESSMENT: NATION CRADLE is a 76 y.o. year  old male recent diagnosis of sleep apnea and initiation of CPAP 03/2022.      PLAN:  OSA on CPAP : Discussed importance of increasing nightly usage greater than 4 hours per night for optimal benefit and for insurance purposes but preferably use of CPAP throughout the duration of sleeping.  Continue current AutoPap settings and continue to follow with DME company for any needed supplies or CPAP related concerns.    Follow up in 6 months or call earlier if needed   CC:  PCP: 04/2022, MD    I spent 21 minutes of face-to-face and non-face-to-face time with patient.  This included previsit chart review, lab review, study review, order entry, electronic health record documentation, patient education regarding diagnosis of sleep apnea with review and discussion of compliance report and answered all other questions to patient's satisfaction   Georgina Quint, [facility]  [facility] [address] [place], [place]  Phone [phone] Fax [phone] Note: This document was prepared with digital dictation and possible smart phrase technology. Any transcriptional errors that result from this process are unintentional.

## 2022-07-01 LAB — CYTOLOGY, (ORAL, ANAL, URETHRAL) ANCILLARY ONLY
Chlamydia: NEGATIVE
Comment: NEGATIVE
Comment: NEGATIVE
Trichomonas: NEGATIVE

## 2022-07-03 DIAGNOSIS — G4733 Obstructive sleep apnea (adult) (pediatric): Secondary | ICD-10-CM | POA: Diagnosis not present

## 2022-07-05 DIAGNOSIS — G4733 Obstructive sleep apnea (adult) (pediatric): Secondary | ICD-10-CM | POA: Diagnosis not present

## 2022-07-14 ENCOUNTER — Encounter: Payer: Self-pay | Admitting: Emergency Medicine

## 2022-07-14 ENCOUNTER — Ambulatory Visit (INDEPENDENT_AMBULATORY_CARE_PROVIDER_SITE_OTHER): Payer: Medicare Other

## 2022-07-14 ENCOUNTER — Ambulatory Visit (INDEPENDENT_AMBULATORY_CARE_PROVIDER_SITE_OTHER): Payer: Medicare Other | Admitting: Emergency Medicine

## 2022-07-14 VITALS — BP 136/70 | HR 55 | Temp 98.0°F | Ht 71.0 in | Wt 245.5 lb

## 2022-07-14 DIAGNOSIS — I700 Atherosclerosis of aorta: Secondary | ICD-10-CM

## 2022-07-14 DIAGNOSIS — I25810 Atherosclerosis of coronary artery bypass graft(s) without angina pectoris: Secondary | ICD-10-CM

## 2022-07-14 DIAGNOSIS — Z0000 Encounter for general adult medical examination without abnormal findings: Secondary | ICD-10-CM | POA: Diagnosis not present

## 2022-07-14 DIAGNOSIS — Z8042 Family history of malignant neoplasm of prostate: Secondary | ICD-10-CM

## 2022-07-14 DIAGNOSIS — A6001 Herpesviral infection of penis: Secondary | ICD-10-CM | POA: Diagnosis not present

## 2022-07-14 DIAGNOSIS — I110 Hypertensive heart disease with heart failure: Secondary | ICD-10-CM

## 2022-07-14 DIAGNOSIS — I10 Essential (primary) hypertension: Secondary | ICD-10-CM

## 2022-07-14 DIAGNOSIS — E785 Hyperlipidemia, unspecified: Secondary | ICD-10-CM

## 2022-07-14 DIAGNOSIS — I5032 Chronic diastolic (congestive) heart failure: Secondary | ICD-10-CM

## 2022-07-14 DIAGNOSIS — K7030 Alcoholic cirrhosis of liver without ascites: Secondary | ICD-10-CM

## 2022-07-14 DIAGNOSIS — R399 Unspecified symptoms and signs involving the genitourinary system: Secondary | ICD-10-CM

## 2022-07-14 LAB — CBC WITH DIFFERENTIAL/PLATELET
Basophils Absolute: 0 10*3/uL (ref 0.0–0.1)
Basophils Relative: 0.6 % (ref 0.0–3.0)
Eosinophils Absolute: 0.1 10*3/uL (ref 0.0–0.7)
Eosinophils Relative: 1.1 % (ref 0.0–5.0)
HCT: 37 % — ABNORMAL LOW (ref 39.0–52.0)
Hemoglobin: 12.3 g/dL — ABNORMAL LOW (ref 13.0–17.0)
Lymphocytes Relative: 40.9 % (ref 12.0–46.0)
Lymphs Abs: 2 10*3/uL (ref 0.7–4.0)
MCHC: 33.1 g/dL (ref 30.0–36.0)
MCV: 88.8 fl (ref 78.0–100.0)
Monocytes Absolute: 0.5 10*3/uL (ref 0.1–1.0)
Monocytes Relative: 9.1 % (ref 3.0–12.0)
Neutro Abs: 2.4 10*3/uL (ref 1.4–7.7)
Neutrophils Relative %: 48.3 % (ref 43.0–77.0)
Platelets: 113 10*3/uL — ABNORMAL LOW (ref 150.0–400.0)
RBC: 4.17 Mil/uL — ABNORMAL LOW (ref 4.22–5.81)
RDW: 12.8 % (ref 11.5–15.5)
WBC: 5 10*3/uL (ref 4.0–10.5)

## 2022-07-14 LAB — COMPREHENSIVE METABOLIC PANEL
ALT: 47 U/L (ref 0–53)
AST: 30 U/L (ref 0–37)
Albumin: 4.3 g/dL (ref 3.5–5.2)
Alkaline Phosphatase: 50 U/L (ref 39–117)
BUN: 28 mg/dL — ABNORMAL HIGH (ref 6–23)
CO2: 25 mEq/L (ref 19–32)
Calcium: 9.5 mg/dL (ref 8.4–10.5)
Chloride: 104 mEq/L (ref 96–112)
Creatinine, Ser: 1.17 mg/dL (ref 0.40–1.50)
GFR: 61.23 mL/min (ref >60.00)
Glucose, Bld: 89 mg/dL (ref 70–99)
Potassium: 4.5 mEq/L (ref 3.5–5.1)
Sodium: 137 mEq/L (ref 135–145)
Total Bilirubin: 0.6 mg/dL (ref 0.2–1.2)
Total Protein: 8.1 g/dL (ref 6.0–8.3)

## 2022-07-14 LAB — PSA: PSA: 1.62 ng/mL (ref 0.10–4.00)

## 2022-07-14 MED ORDER — VALACYCLOVIR HCL 500 MG PO TABS: 500.0000 mg | ORAL_TABLET | Freq: Two times a day (BID) | ORAL | 0 refills | Status: AC

## 2022-07-14 NOTE — Patient Instructions (Signed)
[name] , Thank you for taking time to come for your Medicare Wellness Visit. I appreciate your ongoing commitment to your health goals. Please review the following plan we discussed and let me know if I can assist you in the future.   Screening recommendations/referrals: Colonoscopy: Patient declined Recommended yearly ophthalmology/optometry visit for glaucoma screening and checkup Recommended yearly dental visit for hygiene and checkup  Vaccinations: Influenza vaccine: declined Pneumococcal vaccine: declined Tdap vaccine: 02/11/2013; due every 10 years Shingles vaccine: declined   Covid-19: 07/15/2020, 08/05/2020  Advanced directives: No  Conditions/risks identified: Yes  Next appointment: Please schedule your next Medicare Wellness Visit with your Nurse Health Advisor in 1 year by calling [phone].  Preventive Care 75 Years and Older, Male Preventive care refers to lifestyle choices and visits with your health care provider that can promote health and wellness. What does preventive care include? A yearly physical exam. This is also called an annual well check. Dental exams once or twice a year. Routine eye exams. Ask your health care provider how often you should have your eyes checked. Personal lifestyle choices, including: Daily care of your teeth and gums. Regular physical activity. Eating a healthy diet. Avoiding tobacco and drug use. Limiting alcohol use. Practicing safe sex. Taking low doses of aspirin every day. Taking vitamin and mineral supplements as recommended by your health care provider. What happens during an annual well check? The services and screenings done by your health care provider during your annual well check will depend [name] your age, overall health, lifestyle risk factors, and family history of disease. Counseling  Your health care provider may ask you questions about your: Alcohol use. Tobacco use. Drug use. Emotional well-being. Home and  relationship well-being. Sexual activity. Eating habits. History of falls. Memory and ability to understand (cognition). Work and work Astronomer. Screening  You may have the following tests or measurements: Height, weight, and BMI. Blood pressure. Lipid and cholesterol levels. These may be checked every 5 years, or more frequently if you are over 75 years old. Skin check. Lung cancer screening. You may have this screening every year starting at age 33 if you have a 30-pack-year history of smoking and currently smoke or have quit within the past 15 years. Fecal occult blood test (FOBT) of the stool. You may have this test every year starting at age 75. Flexible sigmoidoscopy or colonoscopy. You may have a sigmoidoscopy every 5 years or a colonoscopy every 10 years starting at age 68. Prostate cancer screening. Recommendations will vary depending [name] your family history and other risks. Hepatitis C blood test. Hepatitis B blood test. Sexually transmitted disease (STD) testing. Diabetes screening. This is done by checking your blood sugar (glucose) after you have not eaten for a while (fasting). You may have this done every 1-3 years. Abdominal aortic aneurysm (AAA) screening. You may need this if you are a current or former smoker. Osteoporosis. You may be screened starting at age 37 if you are at high risk. Talk with your health care provider about your test results, treatment options, and if necessary, the need for more tests. Vaccines  Your health care provider may recommend certain vaccines, such as: Influenza vaccine. This is recommended every year. Tetanus, diphtheria, and acellular pertussis (Tdap, Td) vaccine. You may need a Td booster every 10 years. Zoster vaccine. You may need this after age 75. Pneumococcal 13-valent conjugate (PCV13) vaccine. One dose is recommended after age 37. Pneumococcal polysaccharide (PPSV23) vaccine. One dose is recommended after age  64. Talk to your  health care provider about which screenings and vaccines you need and how often you need them. This information is not intended to replace advice given to you by your health care provider. Make sure you discuss any questions you have with your health care provider. Document Released: 12/12/2015 Document Revised: 08/04/2016 Document Reviewed: 09/16/2015 Elsevier Interactive Patient Education  2017 [place] Prevention in the Home Falls can cause injuries. They can happen to people of all ages. There are many things you can do to make your home safe and to help prevent falls. What can I do [name] the outside of my home? Regularly fix the edges of walkways and driveways and fix any cracks. Remove anything that might make you trip as you walk through a door, such as a raised step or threshold. Trim any bushes or trees [name] the path to your home. Use bright outdoor lighting. Clear any walking paths of anything that might make someone trip, such as rocks or tools. Regularly check to see if handrails are loose or broken. Make sure that both sides of any steps have handrails. Any raised decks and porches should have guardrails [name] the edges. Have any leaves, snow, or ice cleared regularly. Use sand or salt [name] walking paths during winter. Clean up any spills in your garage right away. This includes oil or grease spills. What can I do in the bathroom? Use night lights. Install grab bars by the toilet and in the tub and shower. Do not use towel bars as grab bars. Use non-skid mats or decals in the tub or shower. If you need to sit down in the shower, use a plastic, non-slip stool. Keep the floor dry. Clean up any water that spills [name] the floor as soon as it happens. Remove soap buildup in the tub or shower regularly. Attach bath mats securely with double-sided non-slip rug tape. Do not have throw rugs and other things [name] the floor that can make you trip. What can I do in the bedroom? Use night  lights. Make sure that you have a light by your bed that is easy to reach. Do not use any sheets or blankets that are too big for your bed. They should not hang down onto the floor. Have a firm chair that has side arms. You can use this for support while you get dressed. Do not have throw rugs and other things [name] the floor that can make you trip. What can I do in the kitchen? Clean up any spills right away. Avoid walking [name] wet floors. Keep items that you use a lot in easy-to-reach places. If you need to reach something above you, use a strong step stool that has a grab bar. Keep electrical cords out of the way. Do not use floor polish or wax that makes floors slippery. If you must use wax, use non-skid floor wax. Do not have throw rugs and other things [name] the floor that can make you trip. What can I do with my stairs? Do not leave any items [name] the stairs. Make sure that there are handrails [name] both sides of the stairs and use them. Fix handrails that are broken or loose. Make sure that handrails are as long as the stairways. Check any carpeting to make sure that it is firmly attached to the stairs. Fix any carpet that is loose or worn. Avoid having throw rugs at the top or bottom of the stairs. If you do have  throw rugs, attach them to the floor with carpet tape. Make sure that you have a light switch at the top of the stairs and the bottom of the stairs. If you do not have them, ask someone to add them for you. What else can I do to help prevent falls? Wear shoes that: Do not have high heels. Have rubber bottoms. Are comfortable and fit you well. Are closed at the toe. Do not wear sandals. If you use a stepladder: Make sure that it is fully opened. Do not climb a closed stepladder. Make sure that both sides of the stepladder are locked into place. Ask someone to hold it for you, if possible. Clearly mark and make sure that you can see: Any grab bars or handrails. First and last  steps. Where the edge of each step is. Use tools that help you move around (mobility aids) if they are needed. These include: Canes. Walkers. Scooters. Crutches. Turn [name] the lights when you go into a dark area. Replace any light bulbs as soon as they burn out. Set up your furniture so you have a clear path. Avoid moving your furniture around. If any of your floors are uneven, fix them. If there are any pets around you, be aware of where they are. Review your medicines with your doctor. Some medicines can make you feel dizzy. This can increase your chance of falling. Ask your doctor what other things that you can do to help prevent falls. This information is not intended to replace advice given to you by your health care provider. Make sure you discuss any questions you have with your health care provider. Document Released: 09/11/2009 Document Revised: 04/22/2016 Document Reviewed: 12/20/2014 Elsevier Interactive Patient Education  2017 [employer].

## 2022-07-14 NOTE — Progress Notes (Signed)
I connected with [name] today by telephone and verified that I am speaking with the correct person using two identifiers. Location patient: home Location provider: work Persons participating in the virtual visit: patient, provider.   I discussed the limitations, risks, security and privacy concerns of performing an evaluation and management service by telephone and the availability of in person appointments. I also discussed with the patient that there may be a patient responsible charge related to this service. The patient expressed understanding and verbally consented to this telephonic visit.    Interactive audio and video telecommunications were attempted between this provider and patient, however failed, due to patient having technical difficulties OR patient did not have access to video capability.  We continued and completed visit with audio only.  Some vital signs may be absent or patient reported.   Time Spent with patient on telephone encounter: 30 minutes  Subjective:   [name] is a 76 y.o. male who presents for Medicare Annual/Subsequent preventive examination.  Review of Systems     Cardiac Risk Factors include: advanced age (>75men, >25 women);dyslipidemia;family history of premature cardiovascular disease;hypertension;male gender     Objective:    Today's Vitals   07/14/22 0932  PainSc: 0-No pain   There is no height or weight on file to calculate BMI.     07/14/2022    9:35 AM 05/07/2022   11:31 AM 05/24/2021    8:17 AM 05/17/2021   10:53 AM 04/29/2020   12:07 PM 10/09/2019    1:08 PM 06/15/2019   12:17 PM  Advanced Directives  Does Patient Have a Medical Advance Directive? No No No No No No No  Would patient like information on creating a medical advance directive? No - Patient declined No - Patient declined No - Patient declined No - Patient declined  No - Patient declined No - Patient declined    Current Medications (verified) Outpatient  Encounter Medications as of 07/14/2022  Medication Sig   clotrimazole (LOTRIMIN) 1 % cream Apply to affected area 2 times daily   escitalopram (LEXAPRO) 5 MG tablet Take 5 mg by mouth daily.   furosemide (LASIX) 20 MG tablet Take 40 mg by mouth daily as needed.   indapamide (LOZOL) 1.25 MG tablet Take 1 tablet (1.25 mg total) by mouth daily.   irbesartan (AVAPRO) 300 MG tablet Take 300 mg by mouth daily.   oxyCODONE (ROXICODONE) 5 MG immediate release tablet Take 1 tablet (5 mg total) by mouth every 6 (six) hours as needed for severe pain.   predniSONE (DELTASONE) 50 MG tablet One tablet a day   REPATHA SURECLICK [id number] MG/ML SOAJ INJECT 140 MG INTO THE SKIN EVERY 14 (FOURTEEN) DAYS.   sildenafil (VIAGRA) 100 MG tablet Take 100 mg by mouth daily as needed.   Sofosbuvir-Velpatasvir (EPCLUSA) 400-100 MG TABS Take 1 tablet by mouth daily.   spironolactone (ALDACTONE) 25 MG tablet Take 1 tablet (25 mg total) by mouth daily.   valACYclovir (VALTREX) 500 MG tablet Take 1 tablet (500 mg total) by mouth 2 (two) times daily.   No facility-administered encounter medications on file as of 07/14/2022.    Allergies (verified) Codeine and Vicodin [hydrocodone-acetaminophen]   History: Past Medical History:  Diagnosis Date   Anginal pain ([place]) 01/2005   Anxiety    Arthritis    "knees"   Chronic lower back pain    Colon polyp    Coronary artery disease    Depression    Hepatitis    "  don't know what kind; they treated me for it; was a long time ago" (12/11/2018)   High cholesterol    History of bronchitis    "used to get it alot" (08/25/2012)   Hypertension    Panic attacks    PTSD (post-traumatic stress disorder)    "have been treated in the past" (08/25/2012)   Past Surgical History:  Procedure Laterality Date   CARDIAC CATHETERIZATION  01/2005   CORONARY ARTERY BYPASS GRAFT  01/2005   CABG X3   Shrapnel Left 1969   LLE; left lateral thumb (required grafting)   VARICOSE VEIN SURGERY  1980's    LLE   Family History  Problem Relation Age of Onset   Diabetes Mother    Heart disease Father    Sleep apnea Sister    Cancer Brother    Colon cancer Neg Hx    Liver cancer Neg Hx    Pancreatic cancer Neg Hx    Esophageal cancer Neg Hx    Stomach cancer Neg Hx    Social History   Socioeconomic History   Marital status: Divorced    Spouse name: Not on file   Number of children: Not on file   Years of education: Not on file   Highest education level: Not on file  Occupational History   Not on file  Tobacco Use   Smoking status: Former    Packs/day: 2.00    Years: 5.00    Total pack years: 10.00    Types: Cigarettes   Smokeless tobacco: Never   Tobacco comments:    08/25/2012 "quit smoking cigarettes 40 yr ago"  Vaping Use   Vaping Use: Never used  Substance and Sexual Activity   Alcohol use: Not Currently    Alcohol/week: 60.0 standard drinks of alcohol    Types: 60 Standard drinks or equivalent per week    Comment: 12/11/2018 "~ 1 pint of vodka/day"   Drug use: Yes    Types: Marijuana   Sexual activity: Yes  Other Topics Concern   Not on file  Social History Narrative   Not on file   Social Determinants of Health   Financial Resource Strain: Low Risk  (07/14/2022)   Overall Financial Resource Strain (CARDIA)    Difficulty of Paying Living Expenses: Not hard at all  Food Insecurity: No Food Insecurity (07/14/2022)   Hunger Vital Sign    Worried About Running Out of Food in the Last Year: Never true    Ran Out of Food in the Last Year: Never true  Transportation Needs: No Transportation Needs (07/14/2022)   PRAPARE - Hydrologist (Medical): No    Lack of Transportation (Non-Medical): No  Physical Activity: Sufficiently Active (07/14/2022)   Exercise Vital Sign    Days of Exercise per Week: 6 days    Minutes of Exercise per Session: 60 min  Stress: No Stress Concern Present (07/14/2022)   [place]    Feeling of Stress : Not at all  Social Connections: Moderately Integrated (07/14/2022)   Social Connection and Isolation Panel [NHANES]    Frequency of Communication with Friends and Family: More than three times a week    Frequency of Social Gatherings with Friends and Family: More than three times a week    Attends Religious Services: More than 4 times per year    Active Member of [employer] or Organizations: Yes    Attends Archivist  Meetings: More than 4 times per year    Marital Status: Divorced    Tobacco Counseling Counseling given: Not Answered Tobacco comments: 08/25/2012 "quit smoking cigarettes 40 yr ago"   Clinical Intake:  Pre-visit preparation completed: Yes  Pain : No/denies pain Pain Score: 0-No pain     Nutritional Risks: None Diabetes: No  How often do you need to have someone help you when you read instructions, pamphlets, or other written materials from your doctor or pharmacy?: 1 - Never What is the last grade level you completed in school?: [employer]; Associates  Degree  Diabetic? no  Interpreter Needed?: No  Information entered by :: Lisette Abu, LPN.   Activities of Daily Living    07/14/2022    9:57 AM  In your present state of health, do you have any difficulty performing the following activities:  Hearing? 0  Vision? 0  Difficulty concentrating or making decisions? 0  Walking or climbing stairs? 0  Dressing or bathing? 0  Doing errands, shopping? 0  Preparing Food and eating ? N  Using the Toilet? N  In the past six months, have you accidently leaked urine? N  Do you have problems with loss of bowel control? N  Managing your Medications? N  Managing your Finances? N  Housekeeping or managing your Housekeeping? N    Patient Care Team: Horald Pollen, MD as PCP - General (Internal Medicine) Belva Crome, MD as PCP - Cardiology (Cardiology) Danice Goltz, MD as Consulting  Physician (Ophthalmology)  Indicate any recent Medical Services you may have received from other than [facility] providers in the past year (date may be approximate).     Assessment:   This is a routine wellness examination for [place].  Hearing/Vision screen Hearing Screening - Comments:: Patient denied any hearing difficulty.   No hearing aids.  Vision Screening - Comments:: Patient does wear corrective lenses/contacts.  Eye exam done by: Tama High, MD.    Dietary issues and exercise activities discussed: Current Exercise Habits: Home exercise routine, Type of exercise: walking, Time (Minutes): 60, Frequency (Times/Week): 6, Weekly Exercise (Minutes/Week): 360, Intensity: Moderate, Exercise limited by: None identified   Goals Addressed             This Visit's Progress    To lose weight and get down to 215-220 pounds.        Depression Screen    07/14/2022    9:43 AM 03/24/2022   10:39 AM 01/26/2022    9:52 AM 12/09/2021    8:55 AM 12/11/2020   11:30 AM 08/11/2020    3:06 PM 05/29/2020    3:20 PM  PHQ 2/9 Scores  PHQ - 2 Score 0 1 0 0 4 5 0  PHQ- 9 Score  8   14 20  0    Fall Risk    07/14/2022    9:36 AM 03/24/2022   10:39 AM 01/26/2022    9:51 AM 12/09/2021    8:55 AM 12/11/2020   11:29 AM  Fall Risk   Falls in the past year? 0 0 0 0 0  Number falls in past yr: 0  0 0   Injury with Fall? 0  0 0   Risk for fall due to : No Fall Risks  No Fall Risks    Follow up Falls evaluation completed  Falls evaluation completed  Falls evaluation completed    [place]:  Any stairs in or around the home? No  If so, are there any without handrails? No  Home free of loose throw rugs in walkways, pet beds, electrical cords, etc? Yes  Adequate lighting in your home to reduce risk of falls? Yes   ASSISTIVE DEVICES UTILIZED TO PREVENT FALLS:  Life alert? No  Use of a cane, walker or w/c? No  Grab bars in the bathroom? Yes  Shower chair or  bench in shower? Yes  Elevated toilet seat or a handicapped toilet? No   TIMED UP AND GO:  Was the test performed? No .  Length of time to ambulate 10 feet: n/a sec.   Appearance of gait: Gait not evaluated during this visit.  Cognitive Function:        07/14/2022    9:58 AM 10/09/2019    1:09 PM  6CIT Screen  What Year? 0 points 0 points  What month? 0 points 0 points  What time? 0 points 0 points  Count back from 20 0 points 0 points  Months in reverse 0 points 0 points  Repeat phrase 0 points 0 points  Total Score 0 points 0 points    Immunizations Immunization History  Administered Date(s) Administered   PFIZER(Purple Top)SARS-COV-2 Vaccination 07/15/2020, 08/05/2020   Tdap 02/11/2013    TDAP status: Up to date  Flu vaccine status: declined  Pneumococcal vaccine status: Declined,  Education has been provided regarding the importance of this vaccine but patient still declined. Advised may receive this vaccine at local pharmacy or Health Dept. Aware to provide a copy of the vaccination record if obtained from local pharmacy or Health Dept. Verbalized acceptance and understanding.   Covid-19 vaccine status: Completed vaccines  Qualifies for Shingles Vaccine? Yes   Zostavax completed No   Shingrix Completed?: No.    Education has been provided regarding the importance of this vaccine. Patient has been advised to call insurance company to determine out of pocket expense if they have not yet received this vaccine. Advised may also receive vaccine at local pharmacy or Health Dept. Verbalized acceptance and understanding.  Screening Tests Health Maintenance  Topic Date Due   Zoster Vaccines- Shingrix (1 of 2) Never done   COVID-19 Vaccine (3 - Pfizer series) 09/30/2020   INFLUENZA VACCINE  06/29/2022   Pneumonia Vaccine 82+ Years old (1 - PCV) 03/25/2023 (Originally 08/09/2012)   COLONOSCOPY (Pts 45-59yrs Insurance coverage will need to be confirmed)  03/25/2023  (Originally 08/09/1992)   TETANUS/TDAP  02/12/2023   Hepatitis C Screening  Completed   HPV VACCINES  Aged Out    Health Maintenance  Health Maintenance Due  Topic Date Due   Zoster Vaccines- Shingrix (1 of 2) Never done   COVID-19 Vaccine (3 - Pfizer series) 09/30/2020   INFLUENZA VACCINE  06/29/2022    Colorectal cancer screening: No longer required.  Patient declined.  Lung Cancer Screening: (Low Dose CT Chest recommended if Age 50-80 years, 30 pack-year currently smoking OR have quit w/in 15years.) does not qualify.   Lung Cancer Screening Referral: no  Additional Screening:  Hepatitis C Screening: yes qualify; Completed 03/18/2022  Vision Screening: Recommended annual ophthalmology exams for early detection of glaucoma and other disorders of the eye. Is the patient up to date with their annual eye exam?  Yes  Who is the provider or what is the name of the office in which the patient attends annual eye exams? Georges Mouse, MD. If pt is not established with a provider, would they like to be referred to a provider to  establish care? No .   Dental Screening: Recommended annual dental exams for proper oral hygiene  Community Resource Referral / Chronic Care Management: CRR required this visit?  No   CCM required this visit?  No      Plan:     I have personally reviewed and noted the following in the patient's chart:   Medical and social history Use of alcohol, tobacco or illicit drugs  Current medications and supplements including opioid prescriptions. Patient is currently taking opioid prescriptions. Information provided to patient regarding non-opioid alternatives. Patient advised to discuss non-opioid treatment plan with their provider. Functional ability and status Nutritional status Physical activity Advanced directives List of other physicians Hospitalizations, surgeries, and ER visits in previous 12 months Vitals Screenings to include cognitive,  depression, and falls Referrals and appointments  In addition, I have reviewed and discussed with patient certain preventive protocols, quality metrics, and best practice recommendations. A written personalized care plan for preventive services as well as general preventive health recommendations were provided to patient.     Mickeal Needy, LPN   6/43/3295   Nurse Notes:  Patient is cogitatively intact. There were no vitals filed for this visit. There is no height or weight on file to calculate BMI. Patient stated that he has no issues with gait or balance; does not use any assistive devices.

## 2022-07-14 NOTE — Assessment & Plan Note (Signed)
Well-controlled hypertension. Continue irbesartan 300 mg and Aldactone 25 mg daily. Also continue thiazide diuretic indapamide 1.25 mg daily. BP Readings from Last 3 Encounters:  07/14/22 136/70  06/30/22 134/65  06/28/22 (!) 146/76

## 2022-07-14 NOTE — Progress Notes (Signed)
[name] 76 y.o.   Chief Complaint  Patient presents with   Follow-up    Pt states he has genital herpes, new dx found out last week, patient is currently  not taking medication    HISTORY OF PRESENT ILLNESS: This is a 76 y.o. male here for follow-up of chronic medical problems. Also acute problem of possible genital herpes complaining of painful sores to his penis for several days. No other complaints or medical concerns today. Most recent GI office visit assessment and plan as follows: ASSESSMENT AND PLAN: 76 year old male with history of heavy alcohol use, now abstinent for over a year with a CT evidence suggestive of cirrhosis without ascites, and with labs suggestive of portal hypertension based on mild thrombocytopenia.  His liver enzymes currently look excellent, with normal ALT/AST and normal bilirubin.  No recent INR.  He has recently gained 5 pounds in the past month and does complain of lower extremity swelling, and had a echocardiogram showing mild diastolic dysfunction, otherwise normal.  I have low suspicion that his weight gain is due to ascites or worsening portal hypertension/fluid retention given that his liver enzymes look so good and he has been abstinent for over a year.  We discussed the significance of cirrhosis of the liver, and the various complications that can occur in the setting of cirrhosis.  I applauded his ability to stop drinking alcohol and maintain sobriety and told him that this was the most important thing he can do for his liver.  I told him that it is unlikely that his liver would be able to get back to normal even with abstinence, but that his disease progression would be markedly slowed with his abstinence.  We discussed the role of variceal screening and cirrhosis, and the need for colon cancer screening.  Given his cardiopulmonary comorbidities and stable liver disease, it was unclear that the benefits of variceal screening outweighed the potential  risk, especially in the setting of potentially symptomatic carotid stenosis.  We elected to hold off on variceal screening at this point.  The patient was okay with proceeding with Cologuard for colon cancer screening, and we would again weigh risk and benefits of colonoscopy if the Cologuard were positive.  Even though his cirrhosis is almost certainly secondary to chronic alcohol use, we need to also exclude other potential concomitant causes of liver disease to include viral, autoimmune and genetic.  We also discussed the need for liver cancer screening every 6 months.  He was noted to have a nonspecific abnormality in the dome of the liver in June.  We will follow this up with an MRI.  Patient denies having any metal in his body, although he does have a history of shrapnel injury but he states this was all removed.  He also has sternotomy wires from a CABG.  If this is prohibitive from an MRI, we will get a triphase CT scan of the liver. We discussed what hepatic encephalopathy is, and how minimal encephalopathy can present with subtle symptoms such as sleep-wake cycle disruption.  We discussed starting lactulose now to see if this may improve his symptoms.  The patient preferred to wait until after his sleep study is complete before starting lactulose.  Cirrhosis, presumed alcohol, compensated - INR to calculate MELD - MRI to screen for [facility] and follow-up on abnormality seen on CT and June - Rule out other causes of chronic liver disease (autoimmune, viral, genetic) - AFP - Hold off on variceal screening EGD  for now, will readdress at a later visit - Reassess need for lactulose for minimal encephalopathy following sleep study results - Continue complete abstinence from alcohol - Patient advised to avoid NSAIDs - Given suspected portal hypertension (thrombocytopenia), patient recommended follow low sodium diet  Colon cancer screening - Cologuard   Follow-up 2 to 3 months   Scott E. Tomasa Randunningham,  MD [facility]      HPI   Prior to Admission medications   Medication Sig Start Date End Date Taking? Authorizing Provider  clotrimazole (LOTRIMIN) 1 % cream Apply to affected area 2 times daily 05/07/22   Janell Quietonklin, Erica R, PA-C  escitalopram (LEXAPRO) 5 MG tablet Take 5 mg by mouth daily. 11/09/21   [provider]  furosemide (LASIX) 20 MG tablet Take 40 mg by mouth daily as needed. 05/28/22   [provider]  indapamide (LOZOL) 1.25 MG tablet Take 1 tablet (1.25 mg total) by mouth daily. 12/07/21   Lyn RecordsSmith, Henry W, MD  irbesartan (AVAPRO) 300 MG tablet Take 300 mg by mouth daily.    [provider]  oxyCODONE (ROXICODONE) 5 MG immediate release tablet Take 1 tablet (5 mg total) by mouth every 6 (six) hours as needed for severe pain. 06/28/22   Plotnikov, Georgina QuintAleksei V, MD  predniSONE (DELTASONE) 50 MG tablet One tablet a day 06/19/22   Elson AreasSofia, Leslie K, PA-C  REPATHA SURECLICK 140 MG/ML SOAJ INJECT 140 MG INTO THE SKIN EVERY 14 (FOURTEEN) DAYS. 03/29/22   Lyn RecordsSmith, Henry W, MD  sildenafil (VIAGRA) 100 MG tablet Take 100 mg by mouth daily as needed. 06/17/21   [provider]  Sofosbuvir-Velpatasvir (EPCLUSA) 400-100 MG TABS Take 1 tablet by mouth daily. 02/04/22   Jennette KettleWolfe, Amanda J, RPH-CPP  spironolactone (ALDACTONE) 25 MG tablet Take 1 tablet (25 mg total) by mouth daily. 12/03/21   Lyn RecordsSmith, Henry W, MD  valACYclovir (VALTREX) 500 MG tablet Take 1 tablet (500 mg total) by mouth 2 (two) times daily. 06/28/22   Plotnikov, Georgina QuintAleksei V, MD    Allergies  Allergen Reactions   Codeine Palpitations   Vicodin [Hydrocodone-Acetaminophen] Nausea And Vomiting, Swelling and Palpitations    Patient Active Problem List   Diagnosis Date Noted   HSV infection 06/28/2022   Acute idiopathic gout involving toe of right foot 05/11/2022   Chronic hepatitis C without hepatic coma (HCC) 01/26/2022   Atherosclerosis of aorta (HCC) 06/16/2021   Hypertensive heart disease with  chronic diastolic congestive heart failure (HCC) 06/16/2021   Alcoholic cirrhosis of liver without ascites (HCC) 06/16/2021   Calculus of gallbladder without cholecystitis without obstruction 06/16/2021   Chronic diastolic heart failure (HCC) 12/11/2020   Alcohol abuse with alcohol-induced mood disorder (HCC) 06/16/2019   PTSD (post-traumatic stress disorder) 06/16/2019   Thrombocytopenia (HCC) 02/24/2019   Chronic alcohol abuse 12/09/2015   Coronary artery disease involving coronary bypass graft of native heart without angina pectoris 08/07/2014   Essential hypertension 08/07/2014   Dyslipidemia 08/07/2014    Past Medical History:  Diagnosis Date   Anginal pain (HCC) 01/2005   Anxiety    Arthritis    "knees"   Chronic lower back pain    Colon polyp    Coronary artery disease    Depression    Hepatitis    "don't know what kind; they treated me for it; was a long time ago" (12/11/2018)   High cholesterol    History of bronchitis    "used to get it alot" (08/25/2012)   Hypertension  Panic attacks    PTSD (post-traumatic stress disorder)    "have been treated in the past" (08/25/2012)    Past Surgical History:  Procedure Laterality Date   CARDIAC CATHETERIZATION  01/2005   CORONARY ARTERY BYPASS GRAFT  01/2005   CABG X3   Shrapnel Left 1969   LLE; left lateral thumb (required grafting)   VARICOSE VEIN SURGERY  1980's   LLE    Social History   Socioeconomic History   Marital status: Divorced    Spouse name: Not on file   Number of children: Not on file   Years of education: Not on file   Highest education level: Not on file  Occupational History   Not on file  Tobacco Use   Smoking status: Former    Packs/day: 2.00    Years: 5.00    Total pack years: 10.00    Types: Cigarettes   Smokeless tobacco: Never   Tobacco comments:    08/25/2012 "quit smoking cigarettes 40 yr ago"  Vaping Use   Vaping Use: Never used  Substance and Sexual Activity   Alcohol use: Not  Currently    Alcohol/week: 60.0 standard drinks of alcohol    Types: 60 Standard drinks or equivalent per week    Comment: 12/11/2018 "~ 1 pint of vodka/day"   Drug use: Yes    Types: Marijuana   Sexual activity: Yes  Other Topics Concern   Not on file  Social History Narrative   Not on file   Social Determinants of Health   Financial Resource Strain: Low Risk  (07/14/2022)   Overall Financial Resource Strain (CARDIA)    Difficulty of Paying Living Expenses: Not hard at all  Food Insecurity: No Food Insecurity (07/14/2022)   Hunger Vital Sign    Worried About Running Out of Food in the Last Year: Never true    Ran Out of Food in the Last Year: Never true  Transportation Needs: No Transportation Needs (07/14/2022)   PRAPARE - Administrator, Civil Service (Medical): No    Lack of Transportation (Non-Medical): No  Physical Activity: Sufficiently Active (07/14/2022)   Exercise Vital Sign    Days of Exercise per Week: 6 days    Minutes of Exercise per Session: 60 min  Stress: No Stress Concern Present (07/14/2022)   [employer] of Occupational Health - Occupational Stress Questionnaire    Feeling of Stress : Not at all  Social Connections: Moderately Integrated (07/14/2022)   Social Connection and Isolation Panel [NHANES]    Frequency of Communication with Friends and Family: More than three times a week    Frequency of Social Gatherings with Friends and Family: More than three times a week    Attends Religious Services: More than 4 times per year    Active Member of [employer] or Organizations: Yes    Attends Engineer, structural: More than 4 times per year    Marital Status: Divorced  Intimate Partner Violence: Not At Risk (07/14/2022)   Humiliation, Afraid, Rape, and Kick questionnaire    Fear of Current or Ex-Partner: No    Emotionally Abused: No    Physically Abused: No    Sexually Abused: No    Family History  Problem Relation Age of Onset   Diabetes  Mother    Heart disease Father    Sleep apnea Sister    Cancer Brother    Colon cancer Neg Hx    Liver cancer Neg Hx  Pancreatic cancer Neg Hx    Esophageal cancer Neg Hx    Stomach cancer Neg Hx      Review of Systems  Constitutional: Negative.  Negative for chills and fever.  HENT: Negative.  Negative for congestion and sore throat.   Respiratory: Negative.  Negative for cough and shortness of breath.   Cardiovascular: Negative.  Negative for chest pain and palpitations.  Gastrointestinal:  Negative for abdominal pain, diarrhea, nausea and vomiting.  Genitourinary: Negative.  Negative for dysuria.  Skin: Negative.  Negative for rash.  Neurological:  Negative for dizziness and headaches.  All other systems reviewed and are negative.   Today's Vitals   07/14/22 1406  BP: 136/70  Pulse: (!) 55  Temp: 98 F (36.7 C)  TempSrc: Oral  SpO2: 94%  Weight: 245 lb 8 oz (111.4 kg)  Height: 5\' 11"  (1.803 m)   Body mass index is 34.24 kg/m. Wt Readings from Last 3 Encounters:  07/14/22 245 lb 8 oz (111.4 kg)  06/30/22 248 lb 6 oz (112.7 kg)  06/28/22 245 lb (111.1 kg)    Physical Exam Vitals reviewed.  Constitutional:      Appearance: Normal appearance.  HENT:     Head: Normocephalic.     Mouth/Throat:     Mouth: Mucous membranes are moist.     Pharynx: Oropharynx is clear.  Eyes:     Extraocular Movements: Extraocular movements intact.     Conjunctiva/sclera: Conjunctivae normal.     Pupils: Pupils are equal, round, and reactive to light.  Cardiovascular:     Rate and Rhythm: Normal rate and regular rhythm.     Pulses: Normal pulses.     Heart sounds: Normal heart sounds.  Pulmonary:     Effort: Pulmonary effort is normal.     Breath sounds: Normal breath sounds.  Abdominal:     General: There is no distension.     Palpations: Abdomen is soft.     Tenderness: There is no abdominal tenderness.  Genitourinary:    Comments: Tender small shallow ulcers to distal  shaft area Musculoskeletal:     Cervical back: No tenderness.  Lymphadenopathy:     Cervical: No cervical adenopathy.  Skin:    General: Skin is warm and dry.     Capillary Refill: Capillary refill takes less than 2 seconds.  Neurological:     General: No focal deficit present.     Mental Status: He is alert and oriented to person, place, and time.  Psychiatric:        Mood and Affect: Mood normal.        Behavior: Behavior normal.      ASSESSMENT & PLAN: A total of 52 minutes was spent with the patient and counseling/coordination of care regarding preparing for this visit, review of most recent office visit notes, review of multiple chronic medical problems under management, review of all medications, review of most recent blood work results, education on nutrition, prognosis, documentation and need for follow-up.  Problem List Items Addressed This Visit       Cardiovascular and Mediastinum   Coronary artery disease involving coronary bypass graft of native heart without angina pectoris    Stable.  No recent anginal episodes.      Essential hypertension - Primary    Well-controlled hypertension. Continue irbesartan 300 mg and Aldactone 25 mg daily. Also continue thiazide diuretic indapamide 1.25 mg daily. BP Readings from Last 3 Encounters:  07/14/22 136/70  06/30/22 134/65  06/28/22 06/30/22)  146/76         Atherosclerosis of aorta (HCC)    Diet and nutrition discussed. Continue Repatha 140 mg every 14 days.      Hypertensive heart disease with chronic diastolic congestive heart failure (HCC)    Clinically euvolemic.  No signs of congestive heart failure.        Digestive   Alcoholic cirrhosis of liver without ascites (HCC)    Stable without signs of hepatic encephalopathy or any other complication.        Genitourinary   Herpes simplex infection of penis    Active and affecting quality of life. Start valacyclovir 500 mg twice a day for 7 days.      Relevant  Medications   valACYclovir (VALTREX) 500 MG tablet   Other Relevant Orders   CBC with Differential/Platelet (Completed)   Comprehensive metabolic panel (Completed)     Other   Dyslipidemia    Intolerant to statins.  Continue Repatha every 14 days 140 mg.      Other Visit Diagnoses     Family history of prostate cancer       Relevant Orders   PSA (Completed)   Lower urinary tract symptoms       Relevant Orders   Comprehensive metabolic panel (Completed)   PSA (Completed)      Patient Instructions  Genital Herpes Genital herpes is a common sexually transmitted infection (STI) that is caused by a virus. The virus spreads from person to person through contact with a sore, infected saliva, or infected skin. The virus can cause itching, blisters, and sores around the genitals or rectum. During an outbreak of infection, symptoms may last for several days and then go away. However, the virus remains in the body, so more outbreaks may happen in the future. The time between outbreaks varies and can be from months to years. Genital herpes can affect anyone. It is particularly concerning for pregnant women because the virus can be passed to the baby during delivery. Genital herpes is also a concern for people who have a weak disease-fighting system (immune system). What are the causes? This condition is caused by the herpes simplex virus, type 1 or type 2 (HSV-1 or HSV-2). The virus may spread through: Sexual contact with an infected person, including vaginal, anal, and oral sex. Contact with a herpes sore. The skin. This means that you can get herpes from an infected partner even if there are no blisters or sores present. Your partner may not know that he or she is infected. What increases the risk? You are more likely to develop this condition if: You have sex with many partners. You do not use latex or polyurethane condoms during sex. What are the signs or symptoms? Most people do not  have symptoms or they have mild symptoms that may be mistaken for other skin problems. Symptoms may include: Small, red bumps near the genitals, rectum, or mouth. These bumps turn into blisters and then sores. Flu-like (influenza-like) symptoms, including: Fever. Body aches. Swollen lymph nodes. Headache. Painful urination. Pain and itching in the genital area or rectal area. Vaginal discharge. Tingling or shooting pain in the legs and buttocks. Generally, symptoms are more severe and last longer during the first (primary) outbreak. Influenza-like symptoms are also more common during the primary outbreak. How is this diagnosed? This condition may be diagnosed based on: A physical exam. Your medical history. Blood tests. A test of a fluid sample (culture) from an open sore. How  is this treated? There is no cure for this condition, but treatment with antiviral medicines can do the following: Speed up healing and relieve symptoms. Help to reduce the spread of the virus to sexual partners. Limit the chance of future outbreaks, or make future outbreaks shorter. Lessen symptoms of future outbreaks. Your health care provider may also recommend over-the-counter medicines to help with pain and itching. Follow these instructions at home: If you have an outbreak:  Keep the affected areas dry and clean. Avoid rubbing or touching blisters and sores. If you do touch blisters or sores: Wash your hands thoroughly with soap and water for at least 20 seconds. If soap and water are not available, use an alcohol-based hand sanitizer. Do not touch your eyes afterward. Sexual activity Do not have sexual contact during active outbreaks. Practice safe sex. Herpes can spread even if your partner does not have blisters or sores. Latex or polyurethane condoms and male condoms may help prevent the spread of the herpes virus. Managing pain and discomfort If directed, put ice on the painful area. To do  this: Put ice in a plastic bag. Place a towel between your skin and the bag. Leave the ice on for 20 minutes, 2-3 times a day. Remove the ice if your skin turns bright red. This is very important. If you cannot feel pain, heat, or cold, you have a greater risk of damage to the area. If told, take a cool sitz bath to help relieve pain or itching. A sitz bath is a water bath that you take while sitting down in water that is deep enough to cover your hips and buttocks. General instructions Take over-the-counter and prescription medicines only as told by your health care provider. If you were prescribed an antiviral medicine, use it as told by your health care provider. Do not stop using the antiviral even if you start to feel better. Keep all follow-up visits. This is important. How is this prevented? Use condoms. Although you can get genital herpes during sexual contact even with the use of a condom, a condom can provide some protection. Avoid having multiple sexual partners. Talk with your sexual partner about any symptoms either of you may have. Also, talk with your partner about any history of STIs. Do not have sexual contact if you have active symptoms of genital herpes. Contact a health care provider if: Your symptoms are not improving with medicine. Your symptoms return, or you have new symptoms. You have a fever. You have abdominal pain. You have redness, swelling, or pain in your eye. You notice new sores on other parts of your body. You have had herpes and you become pregnant or plan to become pregnant. Get help right away if: You have symptoms of viral meningitis. This is rare but may happen if the virus spreads to the brain. Symptoms may include: Severe headache or stiff neck. Muscle aches. Nausea and vomiting. Sensitivity to light. Summary Genital herpes is a common sexually transmitted infection (STI) that is caused by the herpes simplex virus, type 1 or type 2 (HSV-1 or  HSV-2). These viruses are most often spread through sexual contact with an infected person. You are more likely to develop this condition if you have sex with many partners or you do not use condoms during sex. Most people do not have symptoms or have mild symptoms that may be mistaken for other skin problems. Symptoms occur as outbreaks that may happen months or years apart. There is no cure  for this condition, but treatment with oral antiviral medicines can reduce symptoms, reduce the chance of spreading the virus to a partner, prevent future outbreaks, or shorten future outbreaks. This information is not intended to replace advice given to you by your health care provider. Make sure you discuss any questions you have with your health care provider. Document Revised: 08/20/2021 Document Reviewed: 08/20/2021 Elsevier Patient Education  2023 Elsevier Inc.     Edwina Barth, MD [facility]

## 2022-07-14 NOTE — Assessment & Plan Note (Signed)
Intolerant to statins.  Continue Repatha every 14 days 140 mg.

## 2022-07-14 NOTE — Assessment & Plan Note (Signed)
Diet and nutrition discussed. Continue Repatha 140 mg every 14 days.

## 2022-07-14 NOTE — Assessment & Plan Note (Signed)
Clinically euvolemic.  No signs of congestive heart failure. 

## 2022-07-14 NOTE — Assessment & Plan Note (Signed)
Stable.  No recent anginal episodes. 

## 2022-07-14 NOTE — Assessment & Plan Note (Signed)
Active and affecting quality of life. Start valacyclovir 500 mg twice a day for 7 days.

## 2022-07-14 NOTE — Assessment & Plan Note (Signed)
Stable without signs of hepatic encephalopathy or any other complication.

## 2022-07-14 NOTE — Patient Instructions (Signed)
Genital Herpes Genital herpes is a common sexually transmitted infection (STI) that is caused by a virus. The virus spreads from person to person through contact with a sore, infected saliva, or infected skin. The virus can cause itching, blisters, and sores around the genitals or rectum. During an outbreak of infection, symptoms may last for several days and then go away. However, the virus remains in the body, so more outbreaks may happen in the future. The time between outbreaks varies and can be from months to years. Genital herpes can affect anyone. It is particularly concerning for pregnant women because the virus can be passed to the baby during delivery. Genital herpes is also a concern for people who have a weak disease-fighting system (immune system). What are the causes? This condition is caused by the herpes simplex virus, type 1 or type 2 (HSV-1 or HSV-2). The virus may spread through: Sexual contact with an infected person, including vaginal, anal, and oral sex. Contact with a herpes sore. The skin. This means that you can get herpes from an infected partner even if there are no blisters or sores present. Your partner may not know that he or she is infected. What increases the risk? You are more likely to develop this condition if: You have sex with many partners. You do not use latex or polyurethane condoms during sex. What are the signs or symptoms? Most people do not have symptoms or they have mild symptoms that may be mistaken for other skin problems. Symptoms may include: Small, red bumps near the genitals, rectum, or mouth. These bumps turn into blisters and then sores. Flu-like (influenza-like) symptoms, including: Fever. Body aches. Swollen lymph nodes. Headache. Painful urination. Pain and itching in the genital area or rectal area. Vaginal discharge. Tingling or shooting pain in the legs and buttocks. Generally, symptoms are more severe and last longer during the first  (primary) outbreak. Influenza-like symptoms are also more common during the primary outbreak. How is this diagnosed? This condition may be diagnosed based on: A physical exam. Your medical history. Blood tests. A test of a fluid sample (culture) from an open sore. How is this treated? There is no cure for this condition, but treatment with antiviral medicines can do the following: Speed up healing and relieve symptoms. Help to reduce the spread of the virus to sexual partners. Limit the chance of future outbreaks, or make future outbreaks shorter. Lessen symptoms of future outbreaks. Your health care provider may also recommend over-the-counter medicines to help with pain and itching. Follow these instructions at home: If you have an outbreak:  Keep the affected areas dry and clean. Avoid rubbing or touching blisters and sores. If you do touch blisters or sores: Wash your hands thoroughly with soap and water for at least 20 seconds. If soap and water are not available, use an alcohol-based hand sanitizer. Do not touch your eyes afterward. Sexual activity Do not have sexual contact during active outbreaks. Practice safe sex. Herpes can spread even if your partner does not have blisters or sores. Latex or polyurethane condoms and male condoms may help prevent the spread of the herpes virus. Managing pain and discomfort If directed, put ice on the painful area. To do this: Put ice in a plastic bag. Place a towel between your skin and the bag. Leave the ice on for 20 minutes, 2-3 times a day. Remove the ice if your skin turns bright red. This is very important. If you cannot feel pain, heat, or   cold, you have a greater risk of damage to the area. If told, take a cool sitz bath to help relieve pain or itching. A sitz bath is a water bath that you take while sitting down in water that is deep enough to cover your hips and buttocks. General instructions Take over-the-counter and  prescription medicines only as told by your health care provider. If you were prescribed an antiviral medicine, use it as told by your health care provider. Do not stop using the antiviral even if you start to feel better. Keep all follow-up visits. This is important. How is this prevented? Use condoms. Although you can get genital herpes during sexual contact even with the use of a condom, a condom can provide some protection. Avoid having multiple sexual partners. Talk with your sexual partner about any symptoms either of you may have. Also, talk with your partner about any history of STIs. Do not have sexual contact if you have active symptoms of genital herpes. Contact a health care provider if: Your symptoms are not improving with medicine. Your symptoms return, or you have new symptoms. You have a fever. You have abdominal pain. You have redness, swelling, or pain in your eye. You notice new sores on other parts of your body. You have had herpes and you become pregnant or plan to become pregnant. Get help right away if: You have symptoms of viral meningitis. This is rare but may happen if the virus spreads to the brain. Symptoms may include: Severe headache or stiff neck. Muscle aches. Nausea and vomiting. Sensitivity to light. Summary Genital herpes is a common sexually transmitted infection (STI) that is caused by the herpes simplex virus, type 1 or type 2 (HSV-1 or HSV-2). These viruses are most often spread through sexual contact with an infected person. You are more likely to develop this condition if you have sex with many partners or you do not use condoms during sex. Most people do not have symptoms or have mild symptoms that may be mistaken for other skin problems. Symptoms occur as outbreaks that may happen months or years apart. There is no cure for this condition, but treatment with oral antiviral medicines can reduce symptoms, reduce the chance of spreading the virus to  a partner, prevent future outbreaks, or shorten future outbreaks. This information is not intended to replace advice given to you by your health care provider. Make sure you discuss any questions you have with your health care provider. Document Revised: 08/20/2021 Document Reviewed: 08/20/2021 Elsevier Patient Education  2023 Elsevier Inc.  

## 2022-08-03 DIAGNOSIS — G4733 Obstructive sleep apnea (adult) (pediatric): Secondary | ICD-10-CM | POA: Diagnosis not present

## 2022-08-09 ENCOUNTER — Other Ambulatory Visit: Payer: Self-pay

## 2022-08-09 ENCOUNTER — Encounter (HOSPITAL_COMMUNITY): Payer: Self-pay

## 2022-08-09 ENCOUNTER — Telehealth: Payer: Self-pay | Admitting: Interventional Cardiology

## 2022-08-09 ENCOUNTER — Emergency Department (HOSPITAL_COMMUNITY): Payer: Medicare Other

## 2022-08-09 ENCOUNTER — Emergency Department (HOSPITAL_COMMUNITY)
Admission: EM | Admit: 2022-08-09 | Discharge: 2022-08-09 | Disposition: A | Discharge status: Home / Self Care | Payer: Medicare Other | Attending: Emergency Medicine | Admitting: Emergency Medicine

## 2022-08-09 DIAGNOSIS — Z79899 Other long term (current) drug therapy: Secondary | ICD-10-CM | POA: Diagnosis not present

## 2022-08-09 DIAGNOSIS — I10 Essential (primary) hypertension: Secondary | ICD-10-CM | POA: Insufficient documentation

## 2022-08-09 DIAGNOSIS — R519 Headache, unspecified: Secondary | ICD-10-CM | POA: Diagnosis not present

## 2022-08-09 DIAGNOSIS — R918 Other nonspecific abnormal finding of lung field: Secondary | ICD-10-CM | POA: Diagnosis not present

## 2022-08-09 DIAGNOSIS — J0100 Acute maxillary sinusitis, unspecified: Secondary | ICD-10-CM

## 2022-08-09 DIAGNOSIS — I2510 Atherosclerotic heart disease of native coronary artery without angina pectoris: Secondary | ICD-10-CM | POA: Diagnosis not present

## 2022-08-09 DIAGNOSIS — R0602 Shortness of breath: Secondary | ICD-10-CM | POA: Diagnosis not present

## 2022-08-09 DIAGNOSIS — R002 Palpitations: Secondary | ICD-10-CM | POA: Diagnosis not present

## 2022-08-09 DIAGNOSIS — R7989 Other specified abnormal findings of blood chemistry: Secondary | ICD-10-CM

## 2022-08-09 DIAGNOSIS — I517 Cardiomegaly: Secondary | ICD-10-CM | POA: Diagnosis not present

## 2022-08-09 DIAGNOSIS — R42 Dizziness and giddiness: Secondary | ICD-10-CM | POA: Diagnosis not present

## 2022-08-09 DIAGNOSIS — Z20822 Contact with and (suspected) exposure to covid-19: Secondary | ICD-10-CM | POA: Diagnosis not present

## 2022-08-09 DIAGNOSIS — R944 Abnormal results of kidney function studies: Secondary | ICD-10-CM | POA: Diagnosis not present

## 2022-08-09 DIAGNOSIS — K0889 Other specified disorders of teeth and supporting structures: Secondary | ICD-10-CM | POA: Diagnosis not present

## 2022-08-09 DIAGNOSIS — R911 Solitary pulmonary nodule: Secondary | ICD-10-CM

## 2022-08-09 DIAGNOSIS — J984 Other disorders of lung: Secondary | ICD-10-CM | POA: Diagnosis not present

## 2022-08-09 LAB — BASIC METABOLIC PANEL
Anion gap: 9 (ref 5–15)
BUN: 29 mg/dL — ABNORMAL HIGH (ref 8–23)
CO2: 22 mmol/L (ref 22–32)
Calcium: 9.3 mg/dL (ref 8.9–10.3)
Chloride: 104 mmol/L (ref 98–111)
Creatinine, Ser: 1.58 mg/dL — ABNORMAL HIGH (ref 0.61–1.24)
GFR, Estimated: 45 mL/min — ABNORMAL LOW (ref >60)
Glucose, Bld: 124 mg/dL — ABNORMAL HIGH (ref 70–99)
Potassium: 3.8 mmol/L (ref 3.5–5.1)
Sodium: 135 mmol/L (ref 135–145)

## 2022-08-09 LAB — RESP PANEL BY RT-PCR (FLU A&B, COVID) ARPGX2
Influenza A by PCR: NEGATIVE
Influenza B by PCR: NEGATIVE
SARS Coronavirus 2 by RT PCR: NEGATIVE

## 2022-08-09 LAB — CBC
HCT: 40.5 % (ref 39.0–52.0)
Hemoglobin: 13.5 g/dL (ref 13.0–17.0)
MCH: 29.9 pg (ref 26.0–34.0)
MCHC: 33.3 g/dL (ref 30.0–36.0)
MCV: 89.8 fL (ref 80.0–100.0)
Platelets: 97 10*3/uL — ABNORMAL LOW (ref 150–400)
RBC: 4.51 MIL/uL (ref 4.22–5.81)
RDW: 12.4 % (ref 11.5–15.5)
WBC: 5.3 10*3/uL (ref 4.0–10.5)
nRBC: 0 % (ref 0.0–0.2)

## 2022-08-09 LAB — URINALYSIS, ROUTINE W REFLEX MICROSCOPIC
Bilirubin Urine: NEGATIVE
Glucose, UA: NEGATIVE mg/dL
Hgb urine dipstick: NEGATIVE
Ketones, ur: NEGATIVE mg/dL
Leukocytes,Ua: NEGATIVE
Nitrite: NEGATIVE
Protein, ur: NEGATIVE mg/dL
Specific Gravity, Urine: 1.013 (ref 1.005–1.030)
pH: 5 (ref 5.0–8.0)

## 2022-08-09 LAB — MAGNESIUM: Magnesium: 1.9 mg/dL (ref 1.7–2.4)

## 2022-08-09 LAB — TROPONIN I (HIGH SENSITIVITY)
Troponin I (High Sensitivity): 6 ng/L (ref <18)
Troponin I (High Sensitivity): 6 ng/L (ref <18)

## 2022-08-09 MED ORDER — AMOXICILLIN-POT CLAVULANATE 875-125 MG PO TABS: 1.0000 | ORAL_TABLET | Freq: Two times a day (BID) | ORAL | 0 refills | Status: DC

## 2022-08-09 MED ORDER — ACETAMINOPHEN 500 MG PO TABS
1000.0000 mg | ORAL_TABLET | Freq: Once | ORAL | Status: DC
  Filled 2022-08-09: qty 2

## 2022-08-09 MED ORDER — OXYCODONE HCL 5 MG PO TABS: 5.0000 mg | ORAL_TABLET | Freq: Four times a day (QID) | ORAL | 0 refills | Status: DC | PRN

## 2022-08-09 MED ORDER — OXYCODONE HCL 5 MG PO TABS
5.0000 mg | ORAL_TABLET | Freq: Once | ORAL | Status: AC
  Administered 2022-08-09: 5 mg via ORAL
  Filled 2022-08-09: qty 1

## 2022-08-09 NOTE — Telephone Encounter (Signed)
Returned call to patient, he states he is being admitting to [facility] due to SOB r/t CHF, and elevated BP of 180/98.  Patient asked if Dr. Katrinka Blazing would be coming to the hospital to oversee his care. Informed patient that [facility] has a cardiology team that will manage his care while he is in the hospital. Dr. Katrinka Blazing is in clinic this week, but can see updates via patient's chart.  Patient verbalized understanding.

## 2022-08-09 NOTE — ED Provider Notes (Signed)
[facility] EMERGENCY DEPARTMENT Provider Note   CSN: [id number] Arrival date & time: 08/09/22  0809     History {Add pertinent medical, surgical, social history, OB history to HPI:1} Chief Complaint  Patient presents with   Shortness of Breath    [name] is a 76 y.o. male.   Shortness of Breath      Home Medications Prior to Admission medications   Medication Sig Start Date End Date Taking? Authorizing Provider  clotrimazole (LOTRIMIN) 1 % cream Apply to affected area 2 times daily 05/07/22   Janell Quiet, PA-C  escitalopram (LEXAPRO) 5 MG tablet Take 5 mg by mouth daily. 11/09/21   [provider]  furosemide (LASIX) 20 MG tablet Take 40 mg by mouth daily as needed. 05/28/22   [provider]  indapamide (LOZOL) 1.25 MG tablet Take 1 tablet (1.25 mg total) by mouth daily. 12/07/21   Lyn Records, MD  irbesartan (AVAPRO) 300 MG tablet Take 300 mg by mouth daily.    [provider]  oxyCODONE (ROXICODONE) 5 MG immediate release tablet Take 1 tablet (5 mg total) by mouth every 6 (six) hours as needed for severe pain. 06/28/22   Plotnikov, Georgina Quint, MD  predniSONE (DELTASONE) 50 MG tablet One tablet a day 06/19/22   Elson Areas, PA-C  REPATHA SURECLICK 140 MG/ML SOAJ INJECT 140 MG INTO THE SKIN EVERY 14 (FOURTEEN) DAYS. 03/29/22   Lyn Records, MD  sildenafil (VIAGRA) 100 MG tablet Take 100 mg by mouth daily as needed. 06/17/21   [provider]  Sofosbuvir-Velpatasvir (EPCLUSA) 400-100 MG TABS Take 1 tablet by mouth daily. 02/04/22   Jennette Kettle, RPH-CPP  spironolactone (ALDACTONE) 25 MG tablet Take 1 tablet (25 mg total) by mouth daily. 12/03/21   Lyn Records, MD      Allergies    Codeine and Vicodin [hydrocodone-acetaminophen]    Review of Systems   Review of Systems  Respiratory:  Positive for shortness of breath.     Physical Exam Updated Vital Signs BP (!) 186/78   Pulse 79   Temp 97.8 F (36.6  C) (Oral)   Resp 18   Ht 5\' 11"  (1.803 m)   Wt 111.1 kg   SpO2 100%   BMI 34.17 kg/m  Physical Exam  ED Results / Procedures / Treatments   Labs (all labs ordered are listed, but only abnormal results are displayed) Labs Reviewed  BASIC METABOLIC PANEL  CBC  URINALYSIS, ROUTINE W REFLEX MICROSCOPIC  CBG MONITORING, ED  TROPONIN I (HIGH SENSITIVITY)    EKG None  Radiology No results found.  Procedures Procedures  {Document cardiac monitor, telemetry assessment procedure when appropriate:1}  Medications Ordered in ED Medications - No data to display  ED Course/ Medical Decision Making/ A&P                           Medical Decision Making Amount and/or Complexity of Data Reviewed Labs: ordered. Radiology: ordered.   ***  {Document critical care time when appropriate:1} {Document review of labs and clinical decision tools ie heart score, Chads2Vasc2 etc:1}  {Document your independent review of radiology images, and any outside records:1} {Document your discussion with family members, caretakers, and with consultants:1} {Document social determinants of health affecting pt's care:1} {Document your decision making why or why not admission, treatments were needed:1} Final Clinical Impression(s) / ED Diagnoses Final diagnoses:  None  Rx / DC Orders ED Discharge Orders     None

## 2022-08-09 NOTE — ED Triage Notes (Signed)
Patient complains of weakness shortness of breath and palpitations.  Denies CP Also complains of numbness all over fast.  BEFASt  NEG

## 2022-08-09 NOTE — Telephone Encounter (Signed)
Patient is calling to let the dr know he is in the ER and will be admitted. Please advise

## 2022-08-09 NOTE — ED Notes (Signed)
Discharge instructions reviewed with patient . Patient verbalized understanding of instructions. Follow-up care and medications were reviewed. Patient ambulatory with steady gait.

## 2022-08-09 NOTE — ED Notes (Signed)
Got patient into a gown on the monitor patient is resting with call bell in reach 

## 2022-08-09 NOTE — Discharge Instructions (Addendum)
Patient reports antibiotics for urinary sinus.  Follow-up chest pain or tooth pain and follow-up with your PCP regarding your palpitations. Your kidney function/Cr was mildly elevated today, recommend you increase your fluid intake over the next few days and follow-up with your PCP for a reassessment. Your chest X-ray also showed a lung nodule which warrants surveillance and follow-up outpatient.  CT Head results: IMPRESSION:  Atrophy with small vessel chronic ischemic changes of deep cerebral  white matter.    No acute intracranial abnormalities.   CXR Results: IMPRESSION:  1. Indeterminate 10 mm nodular density projecting over the left  lateral lung base, seen only on the frontal image. Nonemergent CT  chest can be considered for further evaluation.  2. Indeterminate linear radiodensity projecting over the medial  right clavicle can be evaluated at the same time.

## 2022-08-11 ENCOUNTER — Telehealth: Payer: Self-pay | Admitting: Emergency Medicine

## 2022-08-11 NOTE — Telephone Encounter (Signed)
Called patient and informed patient of provider recommendations. Patient verbalize understanding

## 2022-08-11 NOTE — Telephone Encounter (Signed)
Stop medications.

## 2022-08-11 NOTE — Telephone Encounter (Signed)
PT calls today in regards to ongoing symptoms from last hospital visit. PT had visited the hospital on 09/11 for chronic allergy/sinus issues. When discharged PT was prescribed amoxicillin-clavulanate (AUGMENTIN) and oxyCODONE (ROXICODONE) 5 MG immediate release tablet . PT has been experiencing constant diarrhea since beginning the regiment for these medications. PT is afraid that this interaction might be linked to the sorosis of his liver and would like advising before continuing to take either medication.   CB: [phone]  Also FYI PT was notified by hospital staff that he had a mini stroke during his visit

## 2022-08-12 ENCOUNTER — Encounter: Payer: Self-pay | Admitting: Family Medicine

## 2022-08-12 ENCOUNTER — Ambulatory Visit (INDEPENDENT_AMBULATORY_CARE_PROVIDER_SITE_OTHER): Payer: Medicare Other | Admitting: Family Medicine

## 2022-08-12 VITALS — BP 132/66 | HR 66 | Temp 97.6°F | Ht 71.0 in | Wt 240.0 lb

## 2022-08-12 DIAGNOSIS — J019 Acute sinusitis, unspecified: Secondary | ICD-10-CM | POA: Diagnosis not present

## 2022-08-12 DIAGNOSIS — H6692 Otitis media, unspecified, left ear: Secondary | ICD-10-CM

## 2022-08-12 DIAGNOSIS — R911 Solitary pulmonary nodule: Secondary | ICD-10-CM

## 2022-08-12 DIAGNOSIS — D696 Thrombocytopenia, unspecified: Secondary | ICD-10-CM | POA: Diagnosis not present

## 2022-08-12 DIAGNOSIS — G4733 Obstructive sleep apnea (adult) (pediatric): Secondary | ICD-10-CM | POA: Diagnosis not present

## 2022-08-12 DIAGNOSIS — K7030 Alcoholic cirrhosis of liver without ascites: Secondary | ICD-10-CM

## 2022-08-12 DIAGNOSIS — N183 Chronic kidney disease, stage 3 unspecified: Secondary | ICD-10-CM | POA: Diagnosis not present

## 2022-08-12 LAB — CBC WITH DIFFERENTIAL/PLATELET
Basophils Absolute: 0.1 10*3/uL (ref 0.0–0.1)
Basophils Relative: 1.1 % (ref 0.0–3.0)
Eosinophils Absolute: 0 10*3/uL (ref 0.0–0.7)
Eosinophils Relative: 0.7 % (ref 0.0–5.0)
HCT: 39.1 % (ref 39.0–52.0)
Hemoglobin: 13.3 g/dL (ref 13.0–17.0)
Lymphocytes Relative: 38.3 % (ref 12.0–46.0)
Lymphs Abs: 2.4 10*3/uL (ref 0.7–4.0)
MCHC: 34 g/dL (ref 30.0–36.0)
MCV: 87.6 fl (ref 78.0–100.0)
Monocytes Absolute: 0.4 10*3/uL (ref 0.1–1.0)
Monocytes Relative: 6.6 % (ref 3.0–12.0)
Neutro Abs: 3.3 10*3/uL (ref 1.4–7.7)
Neutrophils Relative %: 53.3 % (ref 43.0–77.0)
Platelets: 108 10*3/uL — ABNORMAL LOW (ref 150.0–400.0)
RBC: 4.46 Mil/uL (ref 4.22–5.81)
RDW: 13.2 % (ref 11.5–15.5)
WBC: 6.2 10*3/uL (ref 4.0–10.5)

## 2022-08-12 LAB — COMPREHENSIVE METABOLIC PANEL
ALT: 41 U/L (ref 0–53)
AST: 29 U/L (ref 0–37)
Albumin: 4.4 g/dL (ref 3.5–5.2)
Alkaline Phosphatase: 56 U/L (ref 39–117)
BUN: 33 mg/dL — ABNORMAL HIGH (ref 6–23)
CO2: 24 mEq/L (ref 19–32)
Calcium: 10.5 mg/dL (ref 8.4–10.5)
Chloride: 99 mEq/L (ref 96–112)
Creatinine, Ser: 1.31 mg/dL (ref 0.40–1.50)
GFR: 53.43 mL/min — ABNORMAL LOW (ref >60.00)
Glucose, Bld: 87 mg/dL (ref 70–99)
Potassium: 4.4 mEq/L (ref 3.5–5.1)
Sodium: 133 mEq/L — ABNORMAL LOW (ref 135–145)
Total Bilirubin: 0.6 mg/dL (ref 0.2–1.2)
Total Protein: 9.3 g/dL — ABNORMAL HIGH (ref 6.0–8.3)

## 2022-08-12 MED ORDER — AMOXICILLIN 875 MG PO TABS: 875.0000 mg | ORAL_TABLET | Freq: Two times a day (BID) | ORAL | 0 refills | Status: AC

## 2022-08-12 NOTE — Patient Instructions (Addendum)
  Please go downstairs for labs before you leave today.  Start taking amoxicillin for your left ear infection and sinus infection. You may also want to take a probiotic such as Align or digestive health or Culturelle to help with good gut bacteria.  [facility] will call you to schedule the chest CT regarding the lung nodule that was found on x-ray.  Please schedule a follow-up with Dr. Alvy Bimler

## 2022-08-12 NOTE — Progress Notes (Unsigned)
Subjective:     Patient ID: [name], male    DOB: 05-22-1946, 76 y.o.   MRN: [id number]  Chief Complaint  Patient presents with   Hospitalization Follow-up    ED 9/11 f/u, pt is still not feeling very well    HPI Patient is in today for follow up from the ED. States he felt light headed, elevated BP, sinus infection   States he feels tired.   States he is not having diarrhea any longer.   Intermittent LUQ pain   States he has cirrhosis of liver and hepatitis and states his eyes started turning yellow    Denies alcohol use or NSAID use.    There are no preventive care reminders to display for this patient.   Past Medical History:  Diagnosis Date   Anginal pain (HCC) 01/2005   Anxiety    Arthritis    "knees"   Chronic lower back pain    Colon polyp    Coronary artery disease    Depression    Hepatitis    "don't know what kind; they treated me for it; was a long time ago" (12/11/2018)   High cholesterol    History of bronchitis    "used to get it alot" (08/25/2012)   Hypertension    Panic attacks    PTSD (post-traumatic stress disorder)    "have been treated in the past" (08/25/2012)    Past Surgical History:  Procedure Laterality Date   CARDIAC CATHETERIZATION  01/2005   CORONARY ARTERY BYPASS GRAFT  01/2005   CABG X3   Shrapnel Left 1969   LLE; left lateral thumb (required grafting)   VARICOSE VEIN SURGERY  1980's   LLE    Family History  Problem Relation Age of Onset   Diabetes Mother    Heart disease Father    Sleep apnea Sister    Cancer Brother    Colon cancer Neg Hx    Liver cancer Neg Hx    Pancreatic cancer Neg Hx    Esophageal cancer Neg Hx    Stomach cancer Neg Hx     Social History   Socioeconomic History   Marital status: Divorced    Spouse name: Not on file   Number of children: Not on file   Years of education: Not on file   Highest education level: Not on file  Occupational History   Not on file  Tobacco Use    Smoking status: Former    Packs/day: 2.00    Years: 5.00    Total pack years: 10.00    Types: Cigarettes   Smokeless tobacco: Never   Tobacco comments:    08/25/2012 "quit smoking cigarettes 40 yr ago"  Vaping Use   Vaping Use: Never used  Substance and Sexual Activity   Alcohol use: Not Currently    Alcohol/week: 60.0 standard drinks of alcohol    Types: 60 Standard drinks or equivalent per week    Comment: 12/11/2018 "~ 1 pint of vodka/day"   Drug use: Not Currently    Types: Marijuana   Sexual activity: Yes  Other Topics Concern   Not on file  Social History Narrative   Not on file   Social Determinants of Health   Financial Resource Strain: Low Risk  (07/14/2022)   Overall Financial Resource Strain (CARDIA)    Difficulty of Paying Living Expenses: Not hard at all  Food Insecurity: No Food Insecurity (07/14/2022)   [name] Vital Sign    Worried  About Running Out of Food in the Last Year: Never true    Ran Out of Food in the Last Year: Never true  Transportation Needs: No Transportation Needs (07/14/2022)   PRAPARE - Administrator, Civil Service (Medical): No    Lack of Transportation (Non-Medical): No  Physical Activity: Sufficiently Active (07/14/2022)   Exercise Vital Sign    Days of Exercise per Week: 6 days    Minutes of Exercise per Session: 60 min  Stress: No Stress Concern Present (07/14/2022)   [employer] of Occupational Health - Occupational Stress Questionnaire    Feeling of Stress : Not at all  Social Connections: Moderately Integrated (07/14/2022)   Social Connection and Isolation Panel [NHANES]    Frequency of Communication with Friends and Family: More than three times a week    Frequency of Social Gatherings with Friends and Family: More than three times a week    Attends Religious Services: More than 4 times per year    Active Member of [employer] or Organizations: Yes    Attends Engineer, structural: More than 4 times per year     Marital Status: Divorced  Intimate Partner Violence: Not At Risk (07/14/2022)   Humiliation, Afraid, Rape, and Kick questionnaire    Fear of Current or Ex-Partner: No    Emotionally Abused: No    Physically Abused: No    Sexually Abused: No    Outpatient Medications Prior to Visit  Medication Sig Dispense Refill   clotrimazole (LOTRIMIN) 1 % cream Apply to affected area 2 times daily 15 g 0   escitalopram (LEXAPRO) 5 MG tablet Take 5 mg by mouth daily.     furosemide (LASIX) 20 MG tablet Take 40 mg by mouth daily as needed.     indapamide (LOZOL) 1.25 MG tablet Take 1 tablet (1.25 mg total) by mouth daily. 90 tablet 3   irbesartan (AVAPRO) 300 MG tablet Take 300 mg by mouth daily.     REPATHA SURECLICK 140 MG/ML SOAJ INJECT 140 MG INTO THE SKIN EVERY 14 (FOURTEEN) DAYS. 2 mL 11   sildenafil (VIAGRA) 100 MG tablet Take 100 mg by mouth daily as needed.     Sofosbuvir-Velpatasvir (EPCLUSA) 400-100 MG TABS Take 1 tablet by mouth daily. 28 tablet 2   spironolactone (ALDACTONE) 25 MG tablet Take 1 tablet (25 mg total) by mouth daily. 30 tablet 5   amoxicillin-clavulanate (AUGMENTIN) 875-125 MG tablet Take 1 tablet by mouth every 12 (twelve) hours for 10 days. 20 tablet 0   oxyCODONE (ROXICODONE) 5 MG immediate release tablet Take 1 tablet (5 mg total) by mouth every 6 (six) hours as needed for up to 10 doses for severe pain or breakthrough pain. 10 tablet 0   predniSONE (DELTASONE) 50 MG tablet One tablet a day 6 tablet 0   No facility-administered medications prior to visit.    Allergies  Allergen Reactions   Codeine Palpitations   Vicodin [Hydrocodone-Acetaminophen] Nausea And Vomiting, Swelling and Palpitations    ROS     Objective:    Physical Exam  BP (!) 142/92 (BP Location: Left Arm, Patient Position: Sitting, Cuff Size: Large)   Pulse 66   Temp 97.6 F (36.4 C) (Temporal)   Ht 5\' 11"  (1.803 m)   Wt 240 lb (108.9 kg)   SpO2 96%   BMI 33.47 kg/m  Wt Readings from Last  3 Encounters:  08/12/22 240 lb (108.9 kg)  08/09/22 245 lb (111.1 kg)  07/14/22 245  lb 8 oz (111.4 kg)       Assessment & Plan:   Problem List Items Addressed This Visit       Digestive   Alcoholic cirrhosis of liver without ascites ([place])   Relevant Orders   Comprehensive metabolic panel     Hematopoietic and Hemostatic   Thrombocytopenia ([place])   Relevant Orders   CBC with Differential/Platelet   Comprehensive metabolic panel   Other Visit Diagnoses     Solid nodule of lung greater than 8 mm in diameter    -  Primary   Relevant Orders   CT Chest W Contrast   Stage 3 chronic kidney disease, unspecified whether stage 3a or 3b CKD ([place])       Relevant Orders   Comprehensive metabolic panel   Acute otitis media, left       Relevant Medications   amoxicillin (AMOXIL) 875 MG tablet   Acute sinusitis, recurrence not specified, unspecified location       Relevant Medications   amoxicillin (AMOXIL) 875 MG tablet       I have discontinued Trinna Balloon. Nikolic's predniSONE, amoxicillin-clavulanate, and oxyCODONE. I am also having him start on amoxicillin. Additionally, I am having him maintain his sildenafil, spironolactone, escitalopram, indapamide, irbesartan, Sofosbuvir-Velpatasvir, Repatha SureClick, clotrimazole, and furosemide.  Meds ordered this encounter  Medications   amoxicillin (AMOXIL) 875 MG tablet    Sig: Take 1 tablet (875 mg total) by mouth 2 (two) times daily for 10 days.    Dispense:  20 tablet    Refill:  0    Order Specific Question:   Supervising Provider    Answer:   Pricilla Holm A [id number]

## 2022-08-13 ENCOUNTER — Telehealth: Payer: Self-pay | Admitting: Emergency Medicine

## 2022-08-13 NOTE — Telephone Encounter (Signed)
Patient would like someone to call him concerning protein in urine.

## 2022-08-13 NOTE — Telephone Encounter (Signed)
Called patient to about his lab questions. Patient has a clear understanding of his lab results

## 2022-08-16 DIAGNOSIS — H6692 Otitis media, unspecified, left ear: Secondary | ICD-10-CM | POA: Insufficient documentation

## 2022-08-16 DIAGNOSIS — R911 Solitary pulmonary nodule: Secondary | ICD-10-CM | POA: Insufficient documentation

## 2022-08-16 NOTE — Assessment & Plan Note (Signed)
Incidental finding of 10 mm nodule on CXR in the ED. CT ordered today per radiology recommendation.

## 2022-08-16 NOTE — Assessment & Plan Note (Signed)
Recheck labs. Avoid toxins for hepatic health.

## 2022-08-16 NOTE — Assessment & Plan Note (Signed)
Amoxicillin prescribed. Follow up if not improving

## 2022-08-16 NOTE — Assessment & Plan Note (Signed)
Amoxicillin prescribed. Recommend a probiotic as well. Symptomatic treatment discussed.

## 2022-08-16 NOTE — Assessment & Plan Note (Signed)
Chronic. No bleeding. Recheck CBC

## 2022-08-19 ENCOUNTER — Telehealth: Payer: Self-pay | Admitting: Emergency Medicine

## 2022-08-19 ENCOUNTER — Encounter: Payer: Self-pay | Admitting: Family Medicine

## 2022-08-19 ENCOUNTER — Ambulatory Visit (INDEPENDENT_AMBULATORY_CARE_PROVIDER_SITE_OTHER): Payer: Medicare Other | Admitting: Family Medicine

## 2022-08-19 VITALS — BP 144/71 | HR 52 | Temp 97.6°F | Wt 244.0 lb

## 2022-08-19 DIAGNOSIS — K08409 Partial loss of teeth, unspecified cause, unspecified class: Secondary | ICD-10-CM

## 2022-08-19 DIAGNOSIS — K1379 Other lesions of oral mucosa: Secondary | ICD-10-CM

## 2022-08-19 DIAGNOSIS — Z8719 Personal history of other diseases of the digestive system: Secondary | ICD-10-CM | POA: Diagnosis not present

## 2022-08-19 MED ORDER — OXYCODONE HCL 5 MG PO TABS: ORAL_TABLET | ORAL | 0 refills | Status: DC

## 2022-08-19 NOTE — Progress Notes (Signed)
OFFICE VISIT  08/19/2022  CC:  Chief Complaint  Patient presents with   Dental Pain   Patient is a 76 y.o. male who presents for tooth pain.  HPI: Patient got his left maxillary wisdom tooth extracted yesterday due to abscess. He is in severe pain.  The dentist ([facility] ) recommended ibuprofen or Tylenol but [name] says he has a history of liver disease and cannot take these. Patient is requesting a small amount of oxycodone to take for acute pain over the next couple of days. I reviewed the [place] online controlled substance prescribing database and verified that patient does not have any suspicious prescription filling activity.   PMP AWARE reviewed today: most recent rx for oxycodone 5mg  was filled 08/09/22, # 10, rx by Regan Lemming, MD (emergency dept).  Prior to that his most recent oxycodone prescription was filled 06/28/2022, #10, prescription by Alexey Plotnikov. No red flags.  History of hep C: Has been treated with antiviral.  Past Medical History:  Diagnosis Date   Anginal pain ([place]) 01/2005   Anxiety    Arthritis    "knees"   Chronic lower back pain    Colon polyp    Coronary artery disease    Depression    Hepatitis    "don't know what kind; they treated me for it; was a long time ago" (12/11/2018)   High cholesterol    History of bronchitis    "used to get it alot" (08/25/2012)   Hypertension    Panic attacks    PTSD (post-traumatic stress disorder)    "have been treated in the past" (08/25/2012)    Past Surgical History:  Procedure Laterality Date   CARDIAC CATHETERIZATION  01/2005   CORONARY ARTERY BYPASS GRAFT  01/2005   CABG X3   Shrapnel Left 1969   LLE; left lateral thumb (required grafting)   VARICOSE VEIN SURGERY  1980's   LLE    Outpatient Medications Prior to Visit  Medication Sig Dispense Refill   amoxicillin (AMOXIL) 875 MG tablet Take 1 tablet (875 mg total) by mouth 2 (two) times daily for 10 days. 20 tablet 0    clotrimazole (LOTRIMIN) 1 % cream Apply to affected area 2 times daily 15 g 0   escitalopram (LEXAPRO) 5 MG tablet Take 5 mg by mouth daily.     furosemide (LASIX) 20 MG tablet Take 40 mg by mouth daily as needed.     indapamide (LOZOL) 1.25 MG tablet Take 1 tablet (1.25 mg total) by mouth daily. 90 tablet 3   irbesartan (AVAPRO) 300 MG tablet Take 300 mg by mouth daily.     REPATHA SURECLICK 673 MG/ML SOAJ INJECT 140 MG INTO THE SKIN EVERY 14 (FOURTEEN) DAYS. 2 mL 11   sildenafil (VIAGRA) 100 MG tablet Take 100 mg by mouth daily as needed.     Sofosbuvir-Velpatasvir (EPCLUSA) 400-100 MG TABS Take 1 tablet by mouth daily. 28 tablet 2   spironolactone (ALDACTONE) 25 MG tablet Take 1 tablet (25 mg total) by mouth daily. 30 tablet 5   No facility-administered medications prior to visit.    Allergies  Allergen Reactions   Codeine Palpitations   Vicodin [Hydrocodone-Acetaminophen] Nausea And Vomiting, Swelling and Palpitations    ROS As per HPI  PE:    08/19/2022    1:42 PM 08/12/2022    2:13 PM 08/12/2022    1:33 PM  Vitals with BMI  Height   5\' 11"   Weight 244 lbs  240 lbs  BMI 0000000  99991111  Systolic 123456 [id number]  Diastolic 71 66 92  Pulse 52  66     Physical Exam  Gen: Alert, well appearing.  Patient is oriented to person, place, time, and situation. AFFECT: pleasant, lucid thought and speech. L maxillary alveolar ridge with no teeth but he has a fresh divot where a tooth was recently extracted.  No surrounding erythema, no exudate. No swelling.  LABS:  Last CBC Lab Results  Component Value Date   WBC 6.2 08/12/2022   HGB 13.3 08/12/2022   HCT 39.1 08/12/2022   MCV 87.6 08/12/2022   MCH 29.9 08/09/2022   RDW 13.2 08/12/2022   PLT 108.0 (L) 123456   Last metabolic panel Lab Results  Component Value Date   GLUCOSE 87 08/12/2022   NA 133 (L) 08/12/2022   K 4.4 08/12/2022   CL 99 08/12/2022   CO2 24 08/12/2022   BUN 33 (H) 08/12/2022   CREATININE 1.31  08/12/2022   GFRNONAA 45 (L) 08/09/2022   CALCIUM 10.5 08/12/2022   PROT 9.3 (H) 08/12/2022   ALBUMIN 4.4 08/12/2022   LABGLOB 3.9 12/03/2021   AGRATIO 1.1 (L) 12/03/2021   BILITOT 0.6 08/12/2022   ALKPHOS 56 08/12/2022   AST 29 08/12/2022   ALT 41 08/12/2022   ANIONGAP 9 08/09/2022   IMPRESSION AND PLAN:  #1 acute mouth pain status post left axillary wisdom tooth extraction due to abscess. Patient needs to avoid Tylenol and NSAIDs due to history of liver disease. I have reviewed the online controlled substance prescription database and there is no suspicious activity for [name]. I did prescribe oxycodone 5 mg, 1 tab every 6 hours as needed, #10 to get him through this period of acute pain.  An After Visit Summary was printed and given to the patient.  FOLLOW UP: Return if symptoms worsen or fail to improve.  Signed:  Crissie Sickles, MD           08/19/2022

## 2022-08-19 NOTE — Telephone Encounter (Signed)
Called patient and left message for patient to call office in reference to previous message 

## 2022-08-19 NOTE — Telephone Encounter (Signed)
Patient would like percocet called in for dental pain.  I told patient that he would need to be seen and he does not want to wait until tomorrow.  Patient request that [name] give him a call.

## 2022-08-20 ENCOUNTER — Telehealth: Payer: Self-pay | Admitting: Family Medicine

## 2022-08-20 NOTE — Telephone Encounter (Signed)
Patient states that [name] ordered CT scan with contrast but he has liver and kidney problems and cannot have contrast. He wants to know if order can be sent for scan to be done without contrast. Call back number is [phone].

## 2022-08-23 ENCOUNTER — Other Ambulatory Visit: Payer: Self-pay | Admitting: Family Medicine

## 2022-08-23 DIAGNOSIS — R911 Solitary pulmonary nodule: Secondary | ICD-10-CM

## 2022-08-23 NOTE — Telephone Encounter (Signed)
Pt is requesting new orders for CT without contrast due to liver and kidney problems.

## 2022-08-24 NOTE — Telephone Encounter (Signed)
Cancelled order for CT with contrast

## 2022-08-24 NOTE — Addendum Note (Signed)
Addended by: Rossie Muskrat on: 08/24/2022 03:34 PM   Modules accepted: Orders

## 2022-08-28 ENCOUNTER — Other Ambulatory Visit: Payer: Self-pay | Admitting: Interventional Cardiology

## 2022-09-02 DIAGNOSIS — G4733 Obstructive sleep apnea (adult) (pediatric): Secondary | ICD-10-CM | POA: Diagnosis not present

## 2022-09-06 ENCOUNTER — Ambulatory Visit: Payer: Medicare Other | Admitting: Emergency Medicine

## 2022-09-08 ENCOUNTER — Encounter: Payer: Self-pay | Admitting: Emergency Medicine

## 2022-09-08 ENCOUNTER — Ambulatory Visit (INDEPENDENT_AMBULATORY_CARE_PROVIDER_SITE_OTHER): Payer: Medicare Other | Admitting: Emergency Medicine

## 2022-09-08 ENCOUNTER — Telehealth: Payer: Self-pay | Admitting: *Deleted

## 2022-09-08 VITALS — BP 136/70 | HR 55 | Temp 97.9°F | Ht 71.0 in | Wt 241.4 lb

## 2022-09-08 DIAGNOSIS — I5032 Chronic diastolic (congestive) heart failure: Secondary | ICD-10-CM | POA: Diagnosis not present

## 2022-09-08 DIAGNOSIS — E785 Hyperlipidemia, unspecified: Secondary | ICD-10-CM

## 2022-09-08 DIAGNOSIS — R911 Solitary pulmonary nodule: Secondary | ICD-10-CM

## 2022-09-08 DIAGNOSIS — I25810 Atherosclerosis of coronary artery bypass graft(s) without angina pectoris: Secondary | ICD-10-CM

## 2022-09-08 DIAGNOSIS — N183 Chronic kidney disease, stage 3 unspecified: Secondary | ICD-10-CM | POA: Diagnosis not present

## 2022-09-08 DIAGNOSIS — G4733 Obstructive sleep apnea (adult) (pediatric): Secondary | ICD-10-CM | POA: Diagnosis not present

## 2022-09-08 DIAGNOSIS — K7030 Alcoholic cirrhosis of liver without ascites: Secondary | ICD-10-CM

## 2022-09-08 DIAGNOSIS — I110 Hypertensive heart disease with heart failure: Secondary | ICD-10-CM

## 2022-09-08 NOTE — Telephone Encounter (Signed)
Very unlikely

## 2022-09-08 NOTE — Telephone Encounter (Signed)
Called patient and informed of provider response

## 2022-09-08 NOTE — Assessment & Plan Note (Signed)
Scheduled for CT of chest later this month

## 2022-09-08 NOTE — Patient Instructions (Signed)
Health Maintenance After Age 75 After age 75, you are at a higher risk for certain long-term diseases and infections as well as injuries from falls. Falls are a major cause of broken bones and head injuries in people who are older than age 75. Getting regular preventive care can help to keep you healthy and well. Preventive care includes getting regular testing and making lifestyle changes as recommended by your health care provider. Talk with your health care provider about: Which screenings and tests you should have. A screening is a test that checks for a disease when you have no symptoms. A diet and exercise plan that is right for you. What should I know about screenings and tests to prevent falls? Screening and testing are the best ways to find a health problem early. Early diagnosis and treatment give you the best chance of managing medical conditions that are common after age 75. Certain conditions and lifestyle choices may make you more likely to have a fall. Your health care provider may recommend: Regular vision checks. Poor vision and conditions such as cataracts can make you more likely to have a fall. If you wear glasses, make sure to get your prescription updated if your vision changes. Medicine review. Work with your health care provider to regularly review all of the medicines you are taking, including over-the-counter medicines. Ask your health care provider about any side effects that may make you more likely to have a fall. Tell your health care provider if any medicines that you take make you feel dizzy or sleepy. Strength and balance checks. Your health care provider may recommend certain tests to check your strength and balance while standing, walking, or changing positions. Foot health exam. Foot pain and numbness, as well as not wearing proper footwear, can make you more likely to have a fall. Screenings, including: Osteoporosis screening. Osteoporosis is a condition that causes  the bones to get weaker and break more easily. Blood pressure screening. Blood pressure changes and medicines to control blood pressure can make you feel dizzy. Depression screening. You may be more likely to have a fall if you have a fear of falling, feel depressed, or feel unable to do activities that you used to do. Alcohol use screening. Using too much alcohol can affect your balance and may make you more likely to have a fall. Follow these instructions at home: Lifestyle Do not drink alcohol if: Your health care provider tells you not to drink. If you drink alcohol: Limit how much you have to: 0-1 drink a day for women. 0-2 drinks a day for men. Know how much alcohol is in your drink. In the [place] one drink equals one 12 oz bottle of beer (355 mL), one 5 oz glass of wine (148 mL), or one 1 oz glass of hard liquor (44 mL). Do not use any products that contain nicotine or tobacco. These products include cigarettes, chewing tobacco, and vaping devices, such as e-cigarettes. If you need help quitting, ask your health care provider. Activity  Follow a regular exercise program to stay fit. This will help you maintain your balance. Ask your health care provider what types of exercise are appropriate for you. If you need a cane or walker, use it as recommended by your health care provider. Wear supportive shoes that have nonskid soles. Safety  Remove any tripping hazards, such as rugs, cords, and clutter. Install safety equipment such as grab bars in bathrooms and safety rails on stairs. Keep rooms and walkways   well-lit. General instructions Talk with your health care provider about your risks for falling. Tell your health care provider if: You fall. Be sure to tell your health care provider about all falls, even ones that seem minor. You feel dizzy, tiredness (fatigue), or off-balance. Take over-the-counter and prescription medicines only as told by your health care provider. These include  supplements. Eat a healthy diet and maintain a healthy weight. A healthy diet includes low-fat dairy products, low-fat (lean) meats, and fiber from whole grains, beans, and lots of fruits and vegetables. Stay current with your vaccines. Schedule regular health, dental, and eye exams. Summary Having a healthy lifestyle and getting preventive care can help to protect your health and wellness after age 75. Screening and testing are the best way to find a health problem early and help you avoid having a fall. Early diagnosis and treatment give you the best chance for managing medical conditions that are more common for people who are older than age 75. Falls are a major cause of broken bones and head injuries in people who are older than age 75. Take precautions to prevent a fall at home. Work with your health care provider to learn what changes you can make to improve your health and wellness and to prevent falls. This information is not intended to replace advice given to you by your health care provider. Make sure you discuss any questions you have with your health care provider. Document Revised: 04/06/2021 Document Reviewed: 04/06/2021 Elsevier Patient Education  2023 [employer]  
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
## 2022-09-08 NOTE — Assessment & Plan Note (Signed)
Clinically euvolemic.  Well-controlled hypertension. BP Readings from Last 3 Encounters:  09/08/22 136/70  08/19/22 (!) 144/71  08/12/22 132/66  Continue irbesartan 300 mg, Lozol 1.25 mg, and Aldactone 25 mg daily.

## 2022-09-08 NOTE — Assessment & Plan Note (Signed)
Stable and abstaining from alcohol. Not retaining fluid.  No peripheral edema. No significant ascites. No signs of hepatic encephalopathy.

## 2022-09-08 NOTE — Assessment & Plan Note (Signed)
No recent anginal episodes.  Stable. 

## 2022-09-08 NOTE — Assessment & Plan Note (Signed)
Intolerant to statins.  Continue Repatha 140 mg every 14 days. Diet and nutrition discussed.

## 2022-09-08 NOTE — Progress Notes (Signed)
[name] 76 y.o.   Chief Complaint  Patient presents with   Follow-up    2-3 week f/u appt, ear wax removal     HISTORY OF PRESENT ILLNESS: This is a 76 y.o. male A1A complaining of possible earwax buildup. Overall doing well. History of alcoholic liver cirrhosis and heart disease. Recent chest x-ray showed left pulmonary nodule about 10 mm.  Scheduled for CT scan of chest later this month. Recently diagnosed with obstructive sleep apnea recently started on CPAP treatment.  Tolerating it well and helping.  HPI   Prior to Admission medications   Medication Sig Start Date End Date Taking? Authorizing Provider  escitalopram (LEXAPRO) 5 MG tablet Take 5 mg by mouth daily. 11/09/21  Yes [provider]  indapamide (LOZOL) 1.25 MG tablet Take 1 tablet (1.25 mg total) by mouth daily. 12/07/21  Yes Lyn Records, MD  irbesartan (AVAPRO) 300 MG tablet TAKE 1 TABLET BY MOUTH EVERY DAY 08/30/22  Yes Lyn Records, MD  REPATHA SURECLICK 140 MG/ML SOAJ INJECT 140 MG INTO THE SKIN EVERY 14 (FOURTEEN) DAYS. 03/29/22  Yes Lyn Records, MD  sildenafil (VIAGRA) 100 MG tablet Take 100 mg by mouth daily as needed. 06/17/21  Yes [provider]  spironolactone (ALDACTONE) 25 MG tablet Take 1 tablet (25 mg total) by mouth daily. 12/03/21  Yes Lyn Records, MD  Sofosbuvir-Velpatasvir (EPCLUSA) 400-100 MG TABS Take 1 tablet by mouth daily. Patient not taking: Reported on 09/08/2022 02/04/22   Jennette Kettle, RPH-CPP    Allergies  Allergen Reactions   Codeine Palpitations   Vicodin [Hydrocodone-Acetaminophen] Nausea And Vomiting, Swelling and Palpitations    Patient Active Problem List   Diagnosis Date Noted   Solid nodule of lung greater than 8 mm in diameter 08/16/2022   Acute otitis media, left 08/16/2022   Herpes simplex infection of penis 07/14/2022   HSV infection 06/28/2022   Acute idiopathic gout involving toe of right foot 05/11/2022   Acute sinusitis 03/24/2022    Chronic hepatitis C without hepatic coma (HCC) 01/26/2022   Atherosclerosis of aorta (HCC) 06/16/2021   Hypertensive heart disease with chronic diastolic congestive heart failure (HCC) 06/16/2021   Alcoholic cirrhosis of liver without ascites (HCC) 06/16/2021   Calculus of gallbladder without cholecystitis without obstruction 06/16/2021   Chronic diastolic heart failure (HCC) 12/11/2020   Alcohol abuse with alcohol-induced mood disorder (HCC) 06/16/2019   PTSD (post-traumatic stress disorder) 06/16/2019   Thrombocytopenia (HCC) 02/24/2019   Chronic alcohol abuse 12/09/2015   Coronary artery disease involving coronary bypass graft of native heart without angina pectoris 08/07/2014   Essential hypertension 08/07/2014   Dyslipidemia 08/07/2014    Past Medical History:  Diagnosis Date   Anginal pain (HCC) 01/2005   Anxiety    Arthritis    "knees"   Chronic lower back pain    Colon polyp    Coronary artery disease    Depression    Hepatitis    "don't know what kind; they treated me for it; was a long time ago" (12/11/2018)   High cholesterol    History of bronchitis    "used to get it alot" (08/25/2012)   Hypertension    Panic attacks    PTSD (post-traumatic stress disorder)    "have been treated in the past" (08/25/2012)    Past Surgical History:  Procedure Laterality Date   CARDIAC CATHETERIZATION  01/2005   CORONARY ARTERY BYPASS GRAFT  01/2005   CABG X3  Shrapnel Left 1969   LLE; left lateral thumb (required grafting)   VARICOSE VEIN SURGERY  1980's   LLE    Social History   Socioeconomic History   Marital status: Divorced    Spouse name: Not on file   Number of children: Not on file   Years of education: Not on file   Highest education level: Not on file  Occupational History   Not on file  Tobacco Use   Smoking status: Former    Packs/day: 2.00    Years: 5.00    Total pack years: 10.00    Types: Cigarettes   Smokeless tobacco: Never   Tobacco comments:     08/25/2012 "quit smoking cigarettes 40 yr ago"  Vaping Use   Vaping Use: Never used  Substance and Sexual Activity   Alcohol use: Not Currently    Alcohol/week: 60.0 standard drinks of alcohol    Types: 60 Standard drinks or equivalent per week    Comment: 12/11/2018 "~ 1 pint of vodka/day"   Drug use: Not Currently    Types: Marijuana   Sexual activity: Yes  Other Topics Concern   Not on file  Social History Narrative   Not on file   Social Determinants of Health   Financial Resource Strain: Low Risk  (07/14/2022)   Overall Financial Resource Strain (CARDIA)    Difficulty of Paying Living Expenses: Not hard at all  Food Insecurity: No Food Insecurity (07/14/2022)   Hunger Vital Sign    Worried About Running Out of Food in the Last Year: Never true    Ran Out of Food in the Last Year: Never true  Transportation Needs: No Transportation Needs (07/14/2022)   PRAPARE - Hydrologist (Medical): No    Lack of Transportation (Non-Medical): No  Physical Activity: Sufficiently Active (07/14/2022)   Exercise Vital Sign    Days of Exercise per Week: 6 days    Minutes of Exercise per Session: 60 min  Stress: No Stress Concern Present (07/14/2022)   [place]    Feeling of Stress : Not at all  Social Connections: Moderately Integrated (07/14/2022)   Social Connection and Isolation Panel [NHANES]    Frequency of Communication with Friends and Family: More than three times a week    Frequency of Social Gatherings with Friends and Family: More than three times a week    Attends Religious Services: More than 4 times per year    Active Member of [employer] or Organizations: Yes    Attends Music therapist: More than 4 times per year    Marital Status: Divorced  Intimate Partner Violence: Not At Risk (07/14/2022)   Humiliation, Afraid, Rape, and Kick questionnaire    Fear of Current or Ex-Partner:  No    Emotionally Abused: No    Physically Abused: No    Sexually Abused: No    Family History  Problem Relation Age of Onset   Diabetes Mother    Heart disease Father    Sleep apnea Sister    Cancer Brother    Colon cancer Neg Hx    Liver cancer Neg Hx    Pancreatic cancer Neg Hx    Esophageal cancer Neg Hx    Stomach cancer Neg Hx      Review of Systems  Constitutional: Negative.  Negative for chills and fever.  HENT: Negative.  Negative for congestion and sore throat.   Respiratory:  Negative.  Negative for cough and wheezing.   Cardiovascular: Negative.  Negative for chest pain and palpitations.  Gastrointestinal:  Negative for abdominal pain, nausea and vomiting.  Genitourinary: Negative.   Musculoskeletal: Negative.   Skin: Negative.  Negative for rash.  Neurological:  Negative for dizziness and headaches.  All other systems reviewed and are negative.  Today's Vitals   09/08/22 1046  BP: 136/70  Pulse: (!) 55  Temp: 97.9 F (36.6 C)  TempSrc: Oral  SpO2: 97%  Weight: 241 lb 6 oz (109.5 kg)  Height: 5\' 11"  (1.803 m)   Body mass index is 33.66 kg/m.   Physical Exam Vitals reviewed.  Constitutional:      Appearance: Normal appearance.  HENT:     Head: Normocephalic.     Right Ear: Tympanic membrane, ear canal and external ear normal.     Left Ear: Tympanic membrane, ear canal and external ear normal.     Mouth/Throat:     Mouth: Mucous membranes are moist.     Pharynx: Oropharynx is clear.  Eyes:     Extraocular Movements: Extraocular movements intact.     Conjunctiva/sclera: Conjunctivae normal.     Pupils: Pupils are equal, round, and reactive to light.  Cardiovascular:     Rate and Rhythm: Normal rate and regular rhythm.     Pulses: Normal pulses.     Heart sounds: Normal heart sounds.  Pulmonary:     Effort: Pulmonary effort is normal.     Breath sounds: Normal breath sounds.  Abdominal:     Palpations: Abdomen is soft.     Tenderness:  There is no abdominal tenderness.  Musculoskeletal:        General: Normal range of motion.     Cervical back: No tenderness.     Right lower leg: No edema.     Left lower leg: No edema.  Lymphadenopathy:     Cervical: No cervical adenopathy.  Skin:    General: Skin is warm and dry.     Capillary Refill: Capillary refill takes less than 2 seconds.  Neurological:     General: No focal deficit present.     Mental Status: He is alert and oriented to person, place, and time.  Psychiatric:        Mood and Affect: Mood normal.        Behavior: Behavior normal.      ASSESSMENT & PLAN: A total of 43 minutes was spent with the patient and counseling/coordination of care regarding preparing for this visit, review of most recent office visit notes, review of most recent chest x-ray report, review of multiple chronic medical conditions under treatment, review of all medications, education on nutrition, prognosis, documentation, and need for follow-up.  Problem List Items Addressed This Visit       Cardiovascular and Mediastinum   Coronary artery disease involving coronary bypass graft of native heart without angina pectoris    No recent anginal episodes.  Stable.      Hypertensive heart disease with chronic diastolic congestive heart failure (HCC)    Clinically euvolemic.  Well-controlled hypertension. BP Readings from Last 3 Encounters:  09/08/22 136/70  08/19/22 (!) 144/71  08/12/22 132/66  Continue irbesartan 300 mg, Lozol 1.25 mg, and Aldactone 25 mg daily.         Respiratory   Solid nodule of lung greater than 8 mm in diameter    Scheduled for CT of chest later this month      OSA  on CPAP    Tolerating it well and helping with overall symptoms.        Digestive   Alcoholic cirrhosis of liver without ascites (HCC) - Primary    Stable and abstaining from alcohol. Not retaining fluid.  No peripheral edema. No significant ascites. No signs of hepatic encephalopathy.         Other   Dyslipidemia    Intolerant to statins.  Continue Repatha 140 mg every 14 days. Diet and nutrition discussed.      Other Visit Diagnoses     Stage 3 chronic kidney disease, unspecified whether stage 3a or 3b CKD [facility])          Patient Instructions  Health Maintenance After Age 32 After age 50, you are at a higher risk for certain long-term diseases and infections as well as injuries from falls. Falls are a major cause of broken bones and head injuries in people who are older than age 18. Getting regular preventive care can help to keep you healthy and well. Preventive care includes getting regular testing and making lifestyle changes as recommended by your health care provider. Talk with your health care provider about: Which screenings and tests you should have. A screening is a test that checks for a disease when you have no symptoms. A diet and exercise plan that is right for you. What should I know about screenings and tests to prevent falls? Screening and testing are the best ways to find a health problem early. Early diagnosis and treatment give you the best chance of managing medical conditions that are common after age 54. Certain conditions and lifestyle choices may make you more likely to have a fall. Your health care provider may recommend: Regular vision checks. Poor vision and conditions such as cataracts can make you more likely to have a fall. If you wear glasses, make sure to get your prescription updated if your vision changes. Medicine review. Work with your health care provider to regularly review all of the medicines you are taking, including over-the-counter medicines. Ask your health care provider about any side effects that may make you more likely to have a fall. Tell your health care provider if any medicines that you take make you feel dizzy or sleepy. Strength and balance checks. Your health care provider may recommend certain tests to check your  strength and balance while standing, walking, or changing positions. Foot health exam. Foot pain and numbness, as well as not wearing proper footwear, can make you more likely to have a fall. Screenings, including: Osteoporosis screening. Osteoporosis is a condition that causes the bones to get weaker and break more easily. Blood pressure screening. Blood pressure changes and medicines to control blood pressure can make you feel dizzy. Depression screening. You may be more likely to have a fall if you have a fear of falling, feel depressed, or feel unable to do activities that you used to do. Alcohol use screening. Using too much alcohol can affect your balance and may make you more likely to have a fall. Follow these instructions at home: Lifestyle Do not drink alcohol if: Your health care provider tells you not to drink. If you drink alcohol: Limit how much you have to: 0-1 drink a day for women. 0-2 drinks a day for men. Know how much alcohol is in your drink. In the [place] one drink equals one 12 oz bottle of beer (355 mL), one 5 oz glass of wine (148 mL), or one 1  oz glass of hard liquor (44 mL). Do not use any products that contain nicotine or tobacco. These products include cigarettes, chewing tobacco, and vaping devices, such as e-cigarettes. If you need help quitting, ask your health care provider. Activity  Follow a regular exercise program to stay fit. This will help you maintain your balance. Ask your health care provider what types of exercise are appropriate for you. If you need a cane or walker, use it as recommended by your health care provider. Wear supportive shoes that have nonskid soles. Safety  Remove any tripping hazards, such as rugs, cords, and clutter. Install safety equipment such as grab bars in bathrooms and safety rails on stairs. Keep rooms and walkways well-lit. General instructions Talk with your health care provider about your risks for falling. Tell your  health care provider if: You fall. Be sure to tell your health care provider about all falls, even ones that seem minor. You feel dizzy, tiredness (fatigue), or off-balance. Take over-the-counter and prescription medicines only as told by your health care provider. These include supplements. Eat a healthy diet and maintain a healthy weight. A healthy diet includes low-fat dairy products, low-fat (lean) meats, and fiber from whole grains, beans, and lots of fruits and vegetables. Stay current with your vaccines. Schedule regular health, dental, and eye exams. Summary Having a healthy lifestyle and getting preventive care can help to protect your health and wellness after age 86. Screening and testing are the best way to find a health problem early and help you avoid having a fall. Early diagnosis and treatment give you the best chance for managing medical conditions that are more common for people who are older than age 68. Falls are a major cause of broken bones and head injuries in people who are older than age 3. Take precautions to prevent a fall at home. Work with your health care provider to learn what changes you can make to improve your health and wellness and to prevent falls. This information is not intended to replace advice given to you by your health care provider. Make sure you discuss any questions you have with your health care provider. Document Revised: 04/06/2021 Document Reviewed: 04/06/2021 Elsevier Patient Education  [place], MD [facility]

## 2022-09-08 NOTE — Assessment & Plan Note (Signed)
Tolerating it well and helping with overall symptoms.

## 2022-09-12 ENCOUNTER — Other Ambulatory Visit: Payer: Self-pay | Admitting: Interventional Cardiology

## 2022-09-20 ENCOUNTER — Ambulatory Visit
Admission: RE | Admit: 2022-09-20 | Discharge: 2022-09-20 | Disposition: A | Discharge status: Home / Self Care | Payer: Medicare Other | Source: Ambulatory Visit | Attending: Family Medicine | Admitting: Family Medicine

## 2022-09-20 DIAGNOSIS — R911 Solitary pulmonary nodule: Secondary | ICD-10-CM | POA: Diagnosis not present

## 2022-09-20 DIAGNOSIS — I700 Atherosclerosis of aorta: Secondary | ICD-10-CM | POA: Diagnosis not present

## 2022-09-20 DIAGNOSIS — I7121 Aneurysm of the ascending aorta, without rupture: Secondary | ICD-10-CM | POA: Diagnosis not present

## 2022-09-21 ENCOUNTER — Telehealth: Payer: Self-pay | Admitting: Emergency Medicine

## 2022-09-21 NOTE — Telephone Encounter (Signed)
Patient had CT scan done yesterday - He would like for someone to call him and let him know what the scan showed.

## 2022-09-21 NOTE — Telephone Encounter (Signed)
Called pt with results.

## 2022-09-21 NOTE — Progress Notes (Signed)
[name],  Please let patient know that the lung nodule is benign, nothing suspicious. Other findings include cirrhosis, which is not a new diagnosis. He also has a 4.1 cm aortic aneurysm which is stable and needs a one year follow up. Gallstones were seen on the scan along with aortic atherosclerosis (plague in the arteries). These are also chronic findings and not new.  I recommend a follow up visit with Dr. Mitchel Honour to discuss any concerns he may have regarding all of the findings.  Thanks,  [employer]

## 2022-10-03 DIAGNOSIS — G4733 Obstructive sleep apnea (adult) (pediatric): Secondary | ICD-10-CM | POA: Diagnosis not present

## 2022-10-25 DIAGNOSIS — H10021 Other mucopurulent conjunctivitis, right eye: Secondary | ICD-10-CM | POA: Diagnosis not present

## 2022-11-02 DIAGNOSIS — G4733 Obstructive sleep apnea (adult) (pediatric): Secondary | ICD-10-CM | POA: Diagnosis not present

## 2022-11-10 DIAGNOSIS — G4733 Obstructive sleep apnea (adult) (pediatric): Secondary | ICD-10-CM | POA: Diagnosis not present

## 2022-11-29 ENCOUNTER — Encounter (HOSPITAL_COMMUNITY): Payer: Self-pay

## 2022-11-29 ENCOUNTER — Ambulatory Visit (HOSPITAL_COMMUNITY)
Admission: EM | Admit: 2022-11-29 | Discharge: 2022-11-29 | Disposition: A | Discharge status: Home / Self Care | Payer: Medicare Other | Attending: Internal Medicine | Admitting: Internal Medicine

## 2022-11-29 DIAGNOSIS — M1009 Idiopathic gout, multiple sites: Secondary | ICD-10-CM

## 2022-11-29 MED ORDER — IBUPROFEN 800 MG PO TABS
800.0000 mg | ORAL_TABLET | Freq: Once | ORAL | Status: AC
  Administered 2022-11-29: 800 mg via ORAL

## 2022-11-29 MED ORDER — TRAMADOL HCL 50 MG PO TABS: 50.0000 mg | ORAL_TABLET | Freq: Two times a day (BID) | ORAL | 0 refills | Status: DC | PRN

## 2022-11-29 MED ORDER — IBUPROFEN 800 MG PO TABS
ORAL_TABLET | ORAL | Status: AC
  Filled 2022-11-29: qty 1

## 2022-11-29 MED ORDER — PREDNISONE 20 MG PO TABS: 60.0000 mg | ORAL_TABLET | Freq: Every day | ORAL | 0 refills | Status: AC

## 2022-11-29 NOTE — ED Provider Notes (Signed)
[facility]    CSN: [id number] Arrival date & time: 11/29/22  1406      History   Chief Complaint Chief Complaint  Patient presents with   gout flare    HPI [name] is a 77 y.o. male with a history of gout comes to urgent care with complaints of bilateral ankle and left great toe pain for few days duration.  Pain is of moderate severity, throbbing and aggravated by movement.  Patient denies any relieving factors.  He ate red meat during the holiday season.  No abdominal pain, nausea, vomiting or diarrhea.  No fever or chills.  No trauma to the feet.  HPI  Past Medical History:  Diagnosis Date   Anginal pain (HCC) 01/2005   Anxiety    Arthritis    "knees"   Chronic lower back pain    Colon polyp    Coronary artery disease    Depression    Hepatitis    "don't know what kind; they treated me for it; was a long time ago" (12/11/2018)   High cholesterol    History of bronchitis    "used to get it alot" (08/25/2012)   Hypertension    Panic attacks    PTSD (post-traumatic stress disorder)    "have been treated in the past" (08/25/2012)    Patient Active Problem List   Diagnosis Date Noted   OSA on CPAP 09/08/2022   Solid nodule of lung greater than 8 mm in diameter 08/16/2022   Acute sinusitis 03/24/2022   Chronic hepatitis C without hepatic coma (HCC) 01/26/2022   Atherosclerosis of aorta (HCC) 06/16/2021   Hypertensive heart disease with chronic diastolic congestive heart failure (HCC) 06/16/2021   Alcoholic cirrhosis of liver without ascites (HCC) 06/16/2021   Calculus of gallbladder without cholecystitis without obstruction 06/16/2021   Chronic diastolic heart failure (HCC) 12/11/2020   Alcohol abuse with alcohol-induced mood disorder (HCC) 06/16/2019   PTSD (post-traumatic stress disorder) 06/16/2019   Thrombocytopenia (HCC) 02/24/2019   Chronic alcohol abuse 12/09/2015   Coronary artery disease involving coronary bypass graft of native heart  without angina pectoris 08/07/2014   Essential hypertension 08/07/2014   Dyslipidemia 08/07/2014    Past Surgical History:  Procedure Laterality Date   CARDIAC CATHETERIZATION  01/2005   CORONARY ARTERY BYPASS GRAFT  01/2005   CABG X3   Shrapnel Left 1969   LLE; left lateral thumb (required grafting)   VARICOSE VEIN SURGERY  1980's   LLE       Home Medications    Prior to Admission medications   Medication Sig Start Date End Date Taking? Authorizing Provider  predniSONE (DELTASONE) 20 MG tablet Take 3 tablets (60 mg total) by mouth daily for 5 days. 11/29/22 12/04/22 Yes Andriy Sherk, Britta Mccreedy, MD  traMADol (ULTRAM) 50 MG tablet Take 1 tablet (50 mg total) by mouth every 12 (twelve) hours as needed. 11/29/22  Yes Aarnav Steagall, Britta Mccreedy, MD  escitalopram (LEXAPRO) 5 MG tablet Take 5 mg by mouth daily. 11/09/21   [provider]  indapamide (LOZOL) 1.25 MG tablet Take 1 tablet (1.25 mg total) by mouth daily. 12/07/21   Lyn Records, MD  irbesartan (AVAPRO) 300 MG tablet TAKE 1 TABLET BY MOUTH EVERY DAY 08/30/22   Lyn Records, MD  REPATHA SURECLICK 140 MG/ML SOAJ INJECT 140 MG INTO THE SKIN EVERY 14 (FOURTEEN) DAYS. 03/29/22   Lyn Records, MD  sildenafil (VIAGRA) 100 MG tablet Take 100 mg by mouth daily  as needed. 06/17/21   [provider]  Sofosbuvir-Velpatasvir (EPCLUSA) 400-100 MG TABS Take 1 tablet by mouth daily. Patient not taking: Reported on 09/08/2022 02/04/22   Esmond Plants, RPH-CPP  spironolactone (ALDACTONE) 25 MG tablet Take 1 tablet (25 mg total) by mouth daily. 12/03/21   Belva Crome, MD    Family History Family History  Problem Relation Age of Onset   Diabetes Mother    Heart disease Father    Sleep apnea Sister    Cancer Brother    Colon cancer Neg Hx    Liver cancer Neg Hx    Pancreatic cancer Neg Hx    Esophageal cancer Neg Hx    Stomach cancer Neg Hx     Social History Social History   Tobacco Use   Smoking status: Former    Packs/day: 2.00     Years: 5.00    Total pack years: 10.00    Types: Cigarettes   Smokeless tobacco: Never   Tobacco comments:    08/25/2012 "quit smoking cigarettes 40 yr ago"  Vaping Use   Vaping Use: Never used  Substance Use Topics   Alcohol use: Not Currently    Alcohol/week: 60.0 standard drinks of alcohol    Types: 60 Standard drinks or equivalent per week    Comment: 12/11/2018 "~ 1 pint of vodka/day"   Drug use: Not Currently    Types: Marijuana     Allergies   Codeine and Vicodin [hydrocodone-acetaminophen]   Review of Systems Review of Systems  Cardiovascular: Negative.   Gastrointestinal: Negative.   Musculoskeletal:  Positive for arthralgias. Negative for gait problem, joint swelling, myalgias, neck pain and neck stiffness.  Skin: Negative.   Neurological: Negative.      Physical Exam Triage Vital Signs ED Triage Vitals  Enc Vitals Group     BP 11/29/22 1458 (!) 167/68     Pulse Rate 11/29/22 1458 67     Resp 11/29/22 1458 16     Temp 11/29/22 1458 98.3 F (36.8 C)     Temp Source 11/29/22 1458 Oral     SpO2 11/29/22 1458 96 %     Weight --      Height --      Head Circumference --      Peak Flow --      Pain Score 11/29/22 1459 8     Pain Loc --      Pain Edu? --      Excl. in [place]? --    No data found.  Updated Vital Signs BP (!) 167/68 (BP Location: Left Arm)   Pulse 67   Temp 98.3 F (36.8 C) (Oral)   Resp 16   SpO2 96%   Visual Acuity Right Eye Distance:   Left Eye Distance:   Bilateral Distance:    Right Eye Near:   Left Eye Near:    Bilateral Near:     Physical Exam Vitals and nursing note reviewed.  Constitutional:      Appearance: Normal appearance.  Cardiovascular:     Rate and Rhythm: Normal rate and regular rhythm.     Pulses: Normal pulses.     Heart sounds: Normal heart sounds.  Pulmonary:     Effort: Pulmonary effort is normal.     Breath sounds: Normal breath sounds.  Musculoskeletal:        General: Swelling and tenderness  present. No deformity or signs of injury. Normal range of motion.     Right  lower leg: No edema.     Left lower leg: No edema.  Skin:    General: Skin is warm and dry.  Neurological:     Mental Status: He is alert.      UC Treatments / Results  Labs (all labs ordered are listed, but only abnormal results are displayed) Labs Reviewed - No data to display  EKG   Radiology No results found.  Procedures Procedures (including critical care time)  Medications Ordered in UC Medications  ibuprofen (ADVIL) tablet 800 mg (800 mg Oral Given 11/29/22 1539)    Initial Impression / Assessment and Plan / UC Course  I have reviewed the triage vital signs and the nursing notes.  Pertinent labs & imaging results that were available during my care of the patient were reviewed by me and considered in my medical decision making (see chart for details).     1.  Acute gout flare: Prednisone 60 mg orally daily for 5 days Tramadol 50 mg every 12 hours as needed for pain Increase oral fluid intake Red meat avoidance will help decrease the frequency of acute gout flare Return precautions given. Final Clinical Impressions(s) / UC Diagnoses   Final diagnoses:  Acute idiopathic gout of multiple sites     Discharge Instructions      Please take medications as prescribed Please avoid excessive red wine or red meat intake Icing of the ankle will help with the pain Return to urgent care if you have worsening symptoms.   ED Prescriptions     Medication Sig Dispense Auth. Provider   predniSONE (DELTASONE) 20 MG tablet Take 3 tablets (60 mg total) by mouth daily for 5 days. 15 tablet Jamyson Jirak, Myrene Galas, MD   traMADol (ULTRAM) 50 MG tablet Take 1 tablet (50 mg total) by mouth every 12 (twelve) hours as needed. 6 tablet Codi Kertz, Myrene Galas, MD      I have reviewed the PDMP during this encounter.   Chase Picket, MD 11/29/22 [phone]

## 2022-11-29 NOTE — Discharge Instructions (Signed)
Please take medications as prescribed Please avoid excessive red wine or red meat intake Icing of the ankle will help with the pain Return to urgent care if you have worsening symptoms.

## 2022-12-03 DIAGNOSIS — G4733 Obstructive sleep apnea (adult) (pediatric): Secondary | ICD-10-CM | POA: Diagnosis not present

## 2022-12-10 ENCOUNTER — Ambulatory Visit (HOSPITAL_COMMUNITY): Admission: RE | Admit: 2022-12-10 | Payer: Medicare Other | Source: Ambulatory Visit

## 2022-12-28 ENCOUNTER — Other Ambulatory Visit (HOSPITAL_COMMUNITY): Payer: Self-pay | Admitting: Cardiology

## 2022-12-28 DIAGNOSIS — I6523 Occlusion and stenosis of bilateral carotid arteries: Secondary | ICD-10-CM

## 2022-12-29 ENCOUNTER — Other Ambulatory Visit (HOSPITAL_COMMUNITY): Payer: Self-pay

## 2022-12-30 ENCOUNTER — Ambulatory Visit (HOSPITAL_COMMUNITY)
Admission: RE | Admit: 2022-12-30 | Discharge: 2022-12-30 | Disposition: A | Discharge status: Home / Self Care | Payer: Medicare Other | Source: Ambulatory Visit | Attending: Internal Medicine | Admitting: Internal Medicine

## 2022-12-30 DIAGNOSIS — I6523 Occlusion and stenosis of bilateral carotid arteries: Secondary | ICD-10-CM | POA: Diagnosis not present

## 2022-12-31 ENCOUNTER — Telehealth: Payer: Self-pay | Admitting: Interventional Cardiology

## 2022-12-31 DIAGNOSIS — I6523 Occlusion and stenosis of bilateral carotid arteries: Secondary | ICD-10-CM

## 2022-12-31 DIAGNOSIS — R9389 Abnormal findings on diagnostic imaging of other specified body structures: Secondary | ICD-10-CM

## 2022-12-31 NOTE — Telephone Encounter (Signed)
Pt called. Advised that Dr. Johney Frame has not reviewed result yet, and that will probably hear from [place] next week on result. Pt very worried about the result and the tech saying something will have to be done.  Reviewed w/ Dr. Irish Lack.  Pt aware that he is not to 80% stenosis yet, so most likely will have repeat test in year or whatever Dr. Johney Frame recommends. Aware someone will let him know next week what, if anything needs to be done sooner with follow up on this. Pt appreciates the call.

## 2022-12-31 NOTE — Telephone Encounter (Signed)
Patient call back upset that he haven't heard from anyone. Explain dr haven't had the chance to review his results. Patient feel like we are not taking his health serious. Please advise

## 2022-12-31 NOTE — Telephone Encounter (Signed)
Pt is requesting call back in regards to tests he recently had done and would like to discuss his next steps and see what other tests he needs to schedule. Please advise.

## 2022-12-31 NOTE — Telephone Encounter (Signed)
Spoke with [name] and advised Dr Johney Frame has not yet reviewed carotid study.  [name] advised he will be contacted with result and recommendation once this has been completed.  [name] verbalizes understanding and thanked Therapist, sports for the call.

## 2023-01-03 ENCOUNTER — Other Ambulatory Visit: Payer: Self-pay | Admitting: *Deleted

## 2023-01-03 DIAGNOSIS — G4733 Obstructive sleep apnea (adult) (pediatric): Secondary | ICD-10-CM | POA: Diagnosis not present

## 2023-01-03 DIAGNOSIS — I6523 Occlusion and stenosis of bilateral carotid arteries: Secondary | ICD-10-CM

## 2023-01-03 DIAGNOSIS — R9389 Abnormal findings on diagnostic imaging of other specified body structures: Secondary | ICD-10-CM

## 2023-01-03 MED ORDER — ASPIRIN 81 MG PO TBEC: 81.0000 mg | DELAYED_RELEASE_TABLET | Freq: Every day | ORAL | 3 refills | Status: AC

## 2023-01-03 NOTE — Telephone Encounter (Signed)
Pt is very concerned to have contrast done for his CT ANGIO NECK due to history of kidney issues/difficulty urinating, after getting contrast in the past.   With that, Dr. Maudie Mercury to cancel this CT ANGIO NECK and order for him to get an MRA OF NECK W/WO CONTRAST instead, to avoid renal issues all together.   New order for MRA NECK placed and [place] will call the pt back to arrange.   Pt made aware of this as well, and agrees with this plan.

## 2023-01-03 NOTE — Telephone Encounter (Signed)
From: Freada Bergeron, MD  Sent: 12/30/2022   4:13 PM EST  To: Dalene Carrow, RVT  Subject: RE: Carotid duplex preliminary results         Thank you so much Danielle for letting me know!   Karlene Einstein, can we get a CTA of his carotids to assess further. We can certainly get that done quickly and then see if he needs to see vascular.   I see he is on repatha, but can we also just be sure he also is taking ASA 81mg  daily.   Thank you both!!    Pt aware of carotid results and recommendations per Dr. Johney Frame.  Pt aware that I will place the order for him to get a CT ANGIO NECK done, asap, and have our [place] reach out to him to arrange this appt.   Advised the pt to continue his repatha injections and start taking ASA 81 mg po daily.   Pt verbalized understanding and agrees with this plan.

## 2023-01-03 NOTE — Addendum Note (Signed)
Addended by: Nuala Alpha on: 01/03/2023 12:25 PM   Modules accepted: Orders

## 2023-01-03 NOTE — Addendum Note (Signed)
Addended by: Nuala Alpha on: 01/03/2023 02:04 PM   Modules accepted: Orders

## 2023-01-03 NOTE — Telephone Encounter (Signed)
  Nuala Alpha, LPN 0/12/5425  0:62 PM EST Back to Top    Pts MRA OF NECK is scheduled for 01/18/23 at 1000. Pt made aware of appt date and time by [facility] Scheduling dept.

## 2023-01-04 ENCOUNTER — Ambulatory Visit: Payer: Medicare Other | Admitting: Adult Health

## 2023-01-07 ENCOUNTER — Encounter (HOSPITAL_COMMUNITY): Payer: Medicare Other

## 2023-01-18 ENCOUNTER — Ambulatory Visit (HOSPITAL_COMMUNITY)
Admission: RE | Admit: 2023-01-18 | Discharge: 2023-01-18 | Disposition: A | Discharge status: Home / Self Care | Payer: Medicare Other | Source: Ambulatory Visit | Attending: Cardiology | Admitting: Cardiology

## 2023-01-18 ENCOUNTER — Other Ambulatory Visit: Payer: Self-pay | Admitting: Cardiology

## 2023-01-18 DIAGNOSIS — R9389 Abnormal findings on diagnostic imaging of other specified body structures: Secondary | ICD-10-CM | POA: Diagnosis not present

## 2023-01-18 DIAGNOSIS — I6523 Occlusion and stenosis of bilateral carotid arteries: Secondary | ICD-10-CM

## 2023-01-18 DIAGNOSIS — I6522 Occlusion and stenosis of left carotid artery: Secondary | ICD-10-CM | POA: Diagnosis not present

## 2023-01-18 MED ORDER — GADOBUTROL 1 MMOL/ML IV SOLN: 10.0000 mL | Freq: Once | INTRAVENOUS | Status: DC | PRN

## 2023-01-20 ENCOUNTER — Telehealth: Payer: Self-pay | Admitting: Cardiology

## 2023-01-20 DIAGNOSIS — I6521 Occlusion and stenosis of right carotid artery: Secondary | ICD-10-CM

## 2023-01-20 NOTE — Telephone Encounter (Signed)
New Message:      [name] is calling with a Call Report

## 2023-01-20 NOTE — Telephone Encounter (Signed)
Critical MRA NECK W/O Contrast call received in [facility] Triage at 915 am on 01/20/2023, from [place] at [facility].   Message / Report taken to Dr. Johney Frame for orders and follow up.  Per Dr. Johney Frame, submit a referral to Vascular provider.

## 2023-01-23 NOTE — Progress Notes (Signed)
[facility] [name]  ASSESSMENT / PLAN: [name] is a 77 y.o. male with bilateral carotid artery stenosis (right 60 to 79%; left 1 to 39%).   His symptoms do not seem typical of symptomatic carotid artery stenosis, but I will refer him to a neurologist for further evaluation.  The patient should continue best medical therapy for carotid artery stenosis including: Complete cessation from all tobacco products. Blood glucose control with goal A1c < 7%. Blood pressure control with goal blood pressure < 140/90 mmHg. Lipid reduction therapy with goal LDL-C <100 mg/dL (<16 if symptomatic from carotid artery stenosis).  Aspirin 81mg  PO QD.  Atorvastatin 40-80mg  PO QD (or other "high intensity" statin therapy).  Plan CT angiogram of the head and neck.  I counseled him about the importance of this study, and the need to give intravenous contrast to best evaluate the arterial system.  He is understanding and willing to proceed.   CHIEF COMPLAINT: carotid artery stenosis  HISTORY OF PRESENT ILLNESS: [name] is a 77 y.o. male referred to clinic for evaluation of carotid artery stenosis.  Patient underwent carotid duplex in early February which showed significant plaque in the right side.  This was followed up by a CT angiogram.  The patient initially refused this because of his concerns about getting contrast.  An MR angiogram without contrast was performed.  This history is not clear-cut for symptomatic carotid artery stenosis.  He does report some visual disturbances, but these have improved with changing his diet, per his report.  He also reports numbness in his right hand and foot.  On further questioning he describes fairly typical symptoms of neuropathy in bilateral feet.  He also reports some facial droop, but says this comes and goes.  He reports he has never been diagnosed with a stroke or mini stroke in the past.  He has well compensated hepatic cirrhosis  from hepatitis C and alcohol abuse.  He is a former smoker.  He has coronary artery disease, with history of bypass in the early 2000's.   Past Medical History:  Diagnosis Date   Anginal pain (HCC) 01/2005   Anxiety    Arthritis    "knees"   Chronic lower back pain    Colon polyp    Coronary artery disease    Depression    Hepatitis    "don't know what kind; they treated me for it; was a long time ago" (12/11/2018)   High cholesterol    History of bronchitis    "used to get it alot" (08/25/2012)   Hypertension    Panic attacks    PTSD (post-traumatic stress disorder)    "have been treated in the past" (08/25/2012)    Past Surgical History:  Procedure Laterality Date   CARDIAC CATHETERIZATION  01/2005   CORONARY ARTERY BYPASS GRAFT  01/2005   CABG X3   Shrapnel Left 1969   LLE; left lateral thumb (required grafting)   VARICOSE VEIN SURGERY  1980's   LLE    Family History  Problem Relation Age of Onset   Diabetes Mother    Heart disease Father    Sleep apnea Sister    Cancer Brother    Colon cancer Neg Hx    Liver cancer Neg Hx    Pancreatic cancer Neg Hx    Esophageal cancer Neg Hx    Stomach cancer Neg Hx     Social History   Socioeconomic History   Marital status:  Divorced    Spouse name: Not on file   Number of children: Not on file   Years of education: Not on file   Highest education level: Not on file  Occupational History   Not on file  Tobacco Use   Smoking status: Former    Packs/day: 2.00    Years: 5.00    Total pack years: 10.00    Types: Cigarettes   Smokeless tobacco: Never   Tobacco comments:    08/25/2012 "quit smoking cigarettes 40 yr ago"  Vaping Use   Vaping Use: Never used  Substance and Sexual Activity   Alcohol use: Not Currently    Alcohol/week: 60.0 standard drinks of alcohol    Types: 60 Standard drinks or equivalent per week    Comment: 12/11/2018 "~ 1 pint of vodka/day"   Drug use: Not Currently    Types: Marijuana   Sexual  activity: Yes  Other Topics Concern   Not on file  Social History Narrative   Not on file   Social Determinants of Health   Financial Resource Strain: Low Risk  (07/14/2022)   Overall Financial Resource Strain (CARDIA)    Difficulty of Paying Living Expenses: Not hard at all  Food Insecurity: No Food Insecurity (07/14/2022)   Hunger Vital Sign    Worried About Running Out of Food in the Last Year: Never true    Ran Out of Food in the Last Year: Never true  Transportation Needs: No Transportation Needs (07/14/2022)   PRAPARE - Administrator, Civil Service (Medical): No    Lack of Transportation (Non-Medical): No  Physical Activity: Sufficiently Active (07/14/2022)   Exercise Vital Sign    Days of Exercise per Week: 6 days    Minutes of Exercise per Session: 60 min  Stress: No Stress Concern Present (07/14/2022)   [employer] of Occupational Health - Occupational Stress Questionnaire    Feeling of Stress : Not at all  Social Connections: Moderately Integrated (07/14/2022)   Social Connection and Isolation Panel [NHANES]    Frequency of Communication with Friends and Family: More than three times a week    Frequency of Social Gatherings with Friends and Family: More than three times a week    Attends Religious Services: More than 4 times per year    Active Member of [employer] or Organizations: Yes    Attends Engineer, structural: More than 4 times per year    Marital Status: Divorced  Intimate Partner Violence: Not At Risk (07/14/2022)   Humiliation, Afraid, Rape, and Kick questionnaire    Fear of Current or Ex-Partner: No    Emotionally Abused: No    Physically Abused: No    Sexually Abused: No    Allergies  Allergen Reactions   Codeine Palpitations   Vicodin [Hydrocodone-Acetaminophen] Nausea And Vomiting, Swelling and Palpitations    Current Outpatient Medications  Medication Sig Dispense Refill   aspirin EC 81 MG tablet Take 1 tablet (81 mg total) by  mouth daily. Swallow whole. 90 tablet 3   escitalopram (LEXAPRO) 5 MG tablet Take 5 mg by mouth daily.     indapamide (LOZOL) 1.25 MG tablet Take 1 tablet (1.25 mg total) by mouth daily. 90 tablet 3   irbesartan (AVAPRO) 300 MG tablet TAKE 1 TABLET BY MOUTH EVERY DAY 30 tablet 8   REPATHA SURECLICK 140 MG/ML SOAJ INJECT 140 MG INTO THE SKIN EVERY 14 (FOURTEEN) DAYS. 2 mL 11   sildenafil (VIAGRA) 100 MG tablet  Take 100 mg by mouth daily as needed.     Sofosbuvir-Velpatasvir (EPCLUSA) 400-100 MG TABS Take 1 tablet by mouth daily. (Patient not taking: Reported on 09/08/2022) 28 tablet 2   spironolactone (ALDACTONE) 25 MG tablet Take 1 tablet (25 mg total) by mouth daily. (Patient not taking: Reported on 01/24/2023) 30 tablet 5   traMADol (ULTRAM) 50 MG tablet Take 1 tablet (50 mg total) by mouth every 12 (twelve) hours as needed. (Patient not taking: Reported on 01/24/2023) 6 tablet 0   No current facility-administered medications for this visit.    PHYSICAL EXAM Vitals:   01/24/23 1022 01/24/23 1027  BP: (!) 145/73 133/66  Pulse: (!) 59 (!) 57  Resp: 18   Temp: 98 F (36.7 C)   TempSrc: Temporal   SpO2: 98%   Weight: 243 lb (110.2 kg)   Height: 5\' 11"  (1.803 m)     Elderly man in no distress Regular rate and rhythm Unlabored breathing Distended abdomen 2+ DP pulses   PERTINENT LABORATORY AND RADIOLOGIC DATA  Most recent CBC    Latest Ref Rng & Units 08/12/2022    2:20 PM 08/09/2022    8:33 AM 07/14/2022    2:36 PM  CBC  WBC 4.0 - 10.5 K/uL 6.2  5.3  5.0   Hemoglobin 13.0 - 17.0 g/dL 95.6  21.3  08.6   Hematocrit 39.0 - 52.0 % 39.1  40.5  37.0   Platelets 150.0 - 400.0 K/uL 108.0  97  113.0      Most recent CMP    Latest Ref Rng & Units 08/12/2022    2:20 PM 08/09/2022    8:33 AM 07/14/2022    2:36 PM  CMP  Glucose 70 - 99 mg/dL 87  578  89   BUN 6 - 23 mg/dL 33  29  28   Creatinine 0.40 - 1.50 mg/dL 4.69  6.29  5.28   Sodium 135 - 145 mEq/L 133  135  137   Potassium  3.5 - 5.1 mEq/L 4.4  3.8  4.5   Chloride 96 - 112 mEq/L 99  104  104   CO2 19 - 32 mEq/L 24  22  25    Calcium 8.4 - 10.5 mg/dL 41.3  9.3  9.5   Total Protein 6.0 - 8.3 g/dL 9.3   8.1   Total Bilirubin 0.2 - 1.2 mg/dL 0.6   0.6   Alkaline Phos 39 - 117 U/L 56   50   AST 0 - 37 U/L 29   30   ALT 0 - 53 U/L 41   47     Renal function CrCl cannot be calculated (Patient's most recent lab result is older than the maximum 21 days allowed.).  Hgb A1c MFr Bld (%)  Date Value  09/17/2021 5.9 (H)    LDL Chol Calc (NIH)  Date Value Ref Range Status  12/03/2021 55 0 - 99 mg/dL Final   LDL Direct  Date Value Ref Range Status  12/03/2021 47 0 - 99 mg/dL Final     Carotid Duplex Right Carotid: Velocities in the right ICA are consistent with a 60-79%                 stenosis. Difficult insonation of the right ICA, velocities  may                be higher than able to obtain. The distal ICA was difficult  to  visualize and demonstrates high-resistant flow consistent  with                possible distal occlusion. The ECA appears >50% stenosed.  Suggest                alternative imaging to further evaluate level of disease.   Left Carotid: Velocities in the left ICA are consistent with a 1-39%  stenosis.   Vertebrals: Bilateral vertebral arteries demonstrate antegrade flow.  Subclavians: Normal flow hemodynamics were seen in bilateral subclavian               arteries.   MR angiogram of neck 1. Limited noncontrast MRA, the patient declined contrast. 2. Critical stenosis versus short segment occlusion involving the proximal right ICA with downstream underfilling. Recommend further assessment to include intracranial angiography. CTA or catheter angiogram can be considered given complicating renal history.    Rande Brunt. Lenell Antu, MD FACS [facility] Phone Number: [phone] 01/24/2023 11:15 AM   Total time spent on preparing  this encounter including chart review, data review, collecting history, examining the patient, coordinating care for this new patient, 60 minutes.  Portions of this report may have been transcribed using voice recognition software.  Every effort has been made to ensure accuracy; however, inadvertent computerized transcription errors may still be present.

## 2023-01-24 ENCOUNTER — Ambulatory Visit (INDEPENDENT_AMBULATORY_CARE_PROVIDER_SITE_OTHER): Payer: Medicare Other | Admitting: Vascular Surgery

## 2023-01-24 ENCOUNTER — Encounter: Payer: Self-pay | Admitting: Vascular Surgery

## 2023-01-24 VITALS — BP 133/66 | HR 57 | Temp 98.0°F | Resp 18 | Ht 71.0 in | Wt 243.0 lb

## 2023-01-24 DIAGNOSIS — I6523 Occlusion and stenosis of bilateral carotid arteries: Secondary | ICD-10-CM | POA: Diagnosis not present

## 2023-01-27 ENCOUNTER — Other Ambulatory Visit: Payer: Self-pay

## 2023-01-27 DIAGNOSIS — I6523 Occlusion and stenosis of bilateral carotid arteries: Secondary | ICD-10-CM

## 2023-01-29 ENCOUNTER — Emergency Department (HOSPITAL_COMMUNITY): Payer: Medicare Other

## 2023-01-29 ENCOUNTER — Encounter (HOSPITAL_COMMUNITY): Payer: Self-pay

## 2023-01-29 ENCOUNTER — Telehealth: Payer: Self-pay | Admitting: Student

## 2023-01-29 ENCOUNTER — Observation Stay (HOSPITAL_COMMUNITY)
Admission: EM | Admit: 2023-01-29 | Discharge: 2023-01-31 | Disposition: A | Discharge status: Home / Self Care | Payer: Medicare Other | Attending: Internal Medicine | Admitting: Internal Medicine

## 2023-01-29 DIAGNOSIS — I10 Essential (primary) hypertension: Secondary | ICD-10-CM

## 2023-01-29 DIAGNOSIS — I6529 Occlusion and stenosis of unspecified carotid artery: Secondary | ICD-10-CM | POA: Insufficient documentation

## 2023-01-29 DIAGNOSIS — F1010 Alcohol abuse, uncomplicated: Secondary | ICD-10-CM | POA: Diagnosis present

## 2023-01-29 DIAGNOSIS — I25700 Atherosclerosis of coronary artery bypass graft(s), unspecified, with unstable angina pectoris: Secondary | ICD-10-CM | POA: Diagnosis not present

## 2023-01-29 DIAGNOSIS — R0789 Other chest pain: Secondary | ICD-10-CM | POA: Diagnosis not present

## 2023-01-29 DIAGNOSIS — G4733 Obstructive sleep apnea (adult) (pediatric): Secondary | ICD-10-CM | POA: Diagnosis not present

## 2023-01-29 DIAGNOSIS — R072 Precordial pain: Secondary | ICD-10-CM | POA: Diagnosis not present

## 2023-01-29 DIAGNOSIS — I130 Hypertensive heart and chronic kidney disease with heart failure and stage 1 through stage 4 chronic kidney disease, or unspecified chronic kidney disease: Secondary | ICD-10-CM | POA: Diagnosis not present

## 2023-01-29 DIAGNOSIS — E785 Hyperlipidemia, unspecified: Secondary | ICD-10-CM | POA: Diagnosis not present

## 2023-01-29 DIAGNOSIS — R079 Chest pain, unspecified: Secondary | ICD-10-CM | POA: Diagnosis present

## 2023-01-29 DIAGNOSIS — K5900 Constipation, unspecified: Secondary | ICD-10-CM | POA: Diagnosis present

## 2023-01-29 DIAGNOSIS — I5032 Chronic diastolic (congestive) heart failure: Secondary | ICD-10-CM | POA: Insufficient documentation

## 2023-01-29 DIAGNOSIS — R6889 Other general symptoms and signs: Secondary | ICD-10-CM | POA: Diagnosis not present

## 2023-01-29 DIAGNOSIS — R001 Bradycardia, unspecified: Secondary | ICD-10-CM

## 2023-01-29 DIAGNOSIS — I25810 Atherosclerosis of coronary artery bypass graft(s) without angina pectoris: Secondary | ICD-10-CM | POA: Diagnosis not present

## 2023-01-29 DIAGNOSIS — N1831 Chronic kidney disease, stage 3a: Secondary | ICD-10-CM | POA: Diagnosis present

## 2023-01-29 DIAGNOSIS — Z7982 Long term (current) use of aspirin: Secondary | ICD-10-CM | POA: Diagnosis not present

## 2023-01-29 DIAGNOSIS — Z87891 Personal history of nicotine dependence: Secondary | ICD-10-CM | POA: Insufficient documentation

## 2023-01-29 DIAGNOSIS — Z79899 Other long term (current) drug therapy: Secondary | ICD-10-CM | POA: Diagnosis not present

## 2023-01-29 DIAGNOSIS — I2510 Atherosclerotic heart disease of native coronary artery without angina pectoris: Secondary | ICD-10-CM | POA: Insufficient documentation

## 2023-01-29 DIAGNOSIS — Z951 Presence of aortocoronary bypass graft: Secondary | ICD-10-CM | POA: Insufficient documentation

## 2023-01-29 DIAGNOSIS — L74512 Primary focal hyperhidrosis, palms: Secondary | ICD-10-CM

## 2023-01-29 DIAGNOSIS — I779 Disorder of arteries and arterioles, unspecified: Secondary | ICD-10-CM | POA: Diagnosis not present

## 2023-01-29 DIAGNOSIS — R202 Paresthesia of skin: Secondary | ICD-10-CM

## 2023-01-29 DIAGNOSIS — B182 Chronic viral hepatitis C: Secondary | ICD-10-CM | POA: Diagnosis present

## 2023-01-29 DIAGNOSIS — I6523 Occlusion and stenosis of bilateral carotid arteries: Secondary | ICD-10-CM | POA: Diagnosis not present

## 2023-01-29 DIAGNOSIS — K7030 Alcoholic cirrhosis of liver without ascites: Secondary | ICD-10-CM | POA: Diagnosis present

## 2023-01-29 DIAGNOSIS — Z743 Need for continuous supervision: Secondary | ICD-10-CM | POA: Diagnosis not present

## 2023-01-29 DIAGNOSIS — F4310 Post-traumatic stress disorder, unspecified: Secondary | ICD-10-CM | POA: Diagnosis present

## 2023-01-29 DIAGNOSIS — I110 Hypertensive heart disease with heart failure: Secondary | ICD-10-CM | POA: Diagnosis present

## 2023-01-29 LAB — COMPREHENSIVE METABOLIC PANEL
ALT: 34 U/L (ref 0–44)
AST: 30 U/L (ref 15–41)
Albumin: 4.3 g/dL (ref 3.5–5.0)
Alkaline Phosphatase: 49 U/L (ref 38–126)
Anion gap: 9 (ref 5–15)
BUN: 26 mg/dL — ABNORMAL HIGH (ref 8–23)
CO2: 24 mmol/L (ref 22–32)
Calcium: 9.5 mg/dL (ref 8.9–10.3)
Chloride: 102 mmol/L (ref 98–111)
Creatinine, Ser: 1.34 mg/dL — ABNORMAL HIGH (ref 0.61–1.24)
GFR, Estimated: 55 mL/min — ABNORMAL LOW (ref >60)
Glucose, Bld: 117 mg/dL — ABNORMAL HIGH (ref 70–99)
Potassium: 4.4 mmol/L (ref 3.5–5.1)
Sodium: 135 mmol/L (ref 135–145)
Total Bilirubin: 0.8 mg/dL (ref 0.3–1.2)
Total Protein: 8.9 g/dL — ABNORMAL HIGH (ref 6.5–8.1)

## 2023-01-29 LAB — CBC
HCT: 43.6 % (ref 39.0–52.0)
Hemoglobin: 14.8 g/dL (ref 13.0–17.0)
MCH: 29.9 pg (ref 26.0–34.0)
MCHC: 33.9 g/dL (ref 30.0–36.0)
MCV: 88.1 fL (ref 80.0–100.0)
Platelets: UNDETERMINED 10*3/uL (ref 150–400)
RBC: 4.95 MIL/uL (ref 4.22–5.81)
RDW: 12.3 % (ref 11.5–15.5)
WBC: 5.6 10*3/uL (ref 4.0–10.5)
nRBC: 0 % (ref 0.0–0.2)

## 2023-01-29 LAB — TROPONIN I (HIGH SENSITIVITY)
Troponin I (High Sensitivity): 6 ng/L (ref <18)
Troponin I (High Sensitivity): 6 ng/L (ref <18)

## 2023-01-29 MED ORDER — IRBESARTAN 150 MG PO TABS
300.0000 mg | ORAL_TABLET | Freq: Every day | ORAL | Status: DC
  Filled 2023-01-29: qty 2

## 2023-01-29 MED ORDER — IOHEXOL 350 MG/ML SOLN
75.0000 mL | Freq: Once | INTRAVENOUS | Status: AC | PRN
  Administered 2023-01-29: 75 mL via INTRAVENOUS

## 2023-01-29 MED ORDER — LACTATED RINGERS IV BOLUS
1000.0000 mL | Freq: Once | INTRAVENOUS | Status: AC
  Administered 2023-01-29: 1000 mL via INTRAVENOUS

## 2023-01-29 MED ORDER — ACETAMINOPHEN 325 MG PO TABS: 650.0000 mg | ORAL_TABLET | ORAL | Status: DC | PRN

## 2023-01-29 MED ORDER — ACETAMINOPHEN 500 MG PO TABS
1000.0000 mg | ORAL_TABLET | Freq: Once | ORAL | Status: DC
  Filled 2023-01-29: qty 2

## 2023-01-29 MED ORDER — ASPIRIN 81 MG PO TBEC
81.0000 mg | DELAYED_RELEASE_TABLET | Freq: Every day | ORAL | Status: DC
  Administered 2023-01-30 – 2023-01-31 (×2): 81 mg via ORAL
  Filled 2023-01-29 (×2): qty 1

## 2023-01-29 MED ORDER — FAMOTIDINE 20 MG PO TABS
20.0000 mg | ORAL_TABLET | Freq: Once | ORAL | Status: AC
  Administered 2023-01-29: 20 mg via ORAL
  Filled 2023-01-29: qty 1

## 2023-01-29 MED ORDER — ALUM & MAG HYDROXIDE-SIMETH 200-200-20 MG/5ML PO SUSP
30.0000 mL | Freq: Once | ORAL | Status: AC
  Administered 2023-01-29: 30 mL via ORAL
  Filled 2023-01-29: qty 30

## 2023-01-29 MED ORDER — ESCITALOPRAM OXALATE 10 MG PO TABS
5.0000 mg | ORAL_TABLET | Freq: Every day | ORAL | Status: DC
  Filled 2023-01-29 (×2): qty 1

## 2023-01-29 MED ORDER — ONDANSETRON HCL 4 MG/2ML IJ SOLN: 4.0000 mg | Freq: Four times a day (QID) | INTRAMUSCULAR | Status: DC | PRN

## 2023-01-29 MED ORDER — INDAPAMIDE 1.25 MG PO TABS
1.2500 mg | ORAL_TABLET | Freq: Every day | ORAL | Status: DC
  Filled 2023-01-29 (×2): qty 1

## 2023-01-29 MED ORDER — MELATONIN 5 MG PO TABS
5.0000 mg | ORAL_TABLET | Freq: Every evening | ORAL | Status: DC | PRN
  Administered 2023-01-29 – 2023-01-30 (×2): 5 mg via ORAL
  Filled 2023-01-29 (×2): qty 1

## 2023-01-29 MED ORDER — ENOXAPARIN SODIUM 60 MG/0.6ML IJ SOSY
0.5000 mg/kg | PREFILLED_SYRINGE | INTRAMUSCULAR | Status: DC
  Administered 2023-01-29 – 2023-01-30 (×2): 55 mg via SUBCUTANEOUS
  Filled 2023-01-29 (×2): qty 0.6

## 2023-01-29 NOTE — Discharge Instructions (Addendum)
It was our pleasure to provide your ER care today - we hope that you feel better.  Drink adequate fluids/stay well hydrated. Follow heart-health eating plan.   For carotid issues, follow up closely with your vascular specialist in the next 1-2 weeks.  For chest discomfort, follow up closely with cardiologist in the coming week.  Return to ER right away if worse, new symptoms, recurrent or persistent chest pain, increased trouble breathing, weak/fainting, one-sided numbness or weakness, change in speech or vision, or other emergency concern.

## 2023-01-29 NOTE — Telephone Encounter (Signed)
   Patient called Answering Service to let us know that he is in the Emergency Department. Called and spoke with patient. He is currently in the [facility] ED for evaluation of chest pain. So far, work-up is unremarkable. High-sensitivity troponin 6 and EKG shows no acute ischemic changes. He is also concerned because he has known carotid stenosis of right ICA and is supposed to have CTA later this week. He is wondering if he can go ahead and get this done now. Recommended speaking with the ED provider. We have not been consulted to see the patient yet. Reassured patient that he is in the right place and explained that ED provider will do initial work-up and then reach out to [place] if needed. Patient thanked me for calling.  Darreld Mclean, PA-C 01/29/2023 11:40 AM

## 2023-01-29 NOTE — H&P (Signed)
History and Physical   [name] [id number] DOB: [birth year] DOA: 01/29/2023  PCP: Horald Pollen, MD   Patient coming from: Home  Chief Complaint: Chest pain, facial paresthesias  HPI: [name] is a 77 y.o. male with medical history significant of thrombocytopenia, OSA, hypertension, diastolic CHF, PTSD, depression, panic disorder, CAD status post CABG, alcohol use, hepatitis C, alcoholic cirrhosis, CKD 3A presenting with chest pain and facial numbness.  Patient reports episode of chest tightness today that started at rest.  He also reports associated sweaty palms bilaterally as well as bilateral face numbness and tingling.  Denies any radiation to his chest pain and no association with exertion.  He further denies shortness of breath.  He also denies fevers, chills, abdominal pain, constipation, diarrhea, nausea, vomiting.  Patient also has concern for his ongoing work up for carotid stenosis.  He is getting worked up for this due to multiple symptoms and ultrasound showing significant plaque on the right.  He has previously experienced intermittent visual changes, paresthesias in his hands and feet, facial droop that comes and goes per recent vascular surgery note.  ED Course: Vital signs in ED notable for blood pressure in the Q000111Q Q000111Q systolic and heart rate in the 40s to 60s.  Lab workup included CMP with BUN 26, creatinine stable at 1.34, glucose 117, protein 8.9.  CBC within normal limits.  Troponin negative x 2.  Chest x-ray showed no acute malady.  CT of the head and neck showing no intracranial abnormalities.  Did show critical stenosis of the right ICA and 30% left ICA stenosis with questionable 2 to 3 mm aneurysm of the left ICA.  Patient see Tylenol, Pepcid, Maalox in the ED as well as a liter of fluid.  Case was discussed with vascular surgery who stated patient will be able to follow-up outpatient in the next week with recommendation for addressing other  complaints prior to surgery.   Review of Systems: As per HPI otherwise all other systems reviewed and are negative.  Past Medical History:  Diagnosis Date   Anginal pain ([place]) 01/2005   Anxiety    Arthritis    "knees"   Chronic lower back pain    Colon polyp    Coronary artery disease    Depression    Hepatitis    "don't know what kind; they treated me for it; was a long time ago" (12/11/2018)   High cholesterol    History of bronchitis    "used to get it alot" (08/25/2012)   Hypertension    Panic attacks    PTSD (post-traumatic stress disorder)    "have been treated in the past" (08/25/2012)    Past Surgical History:  Procedure Laterality Date   CARDIAC CATHETERIZATION  01/2005   CORONARY ARTERY BYPASS GRAFT  01/2005   CABG X3   Shrapnel Left 1969   LLE; left lateral thumb (required grafting)   VARICOSE VEIN SURGERY  1980's   LLE    Social History  reports that he has quit smoking. His smoking use included cigarettes. He has a 10.00 pack-year smoking history. He has never used smokeless tobacco. He reports that he does not currently use alcohol after a past usage of about 60.0 standard drinks of alcohol per week. He reports that he does not currently use drugs after having used the following drugs: Marijuana.  Allergies  Allergen Reactions   Codeine Palpitations   Vicodin [Hydrocodone-Acetaminophen] Nausea And Vomiting, Swelling and Palpitations  Family History  Problem Relation Age of Onset   Diabetes Mother    Heart disease Father    Sleep apnea Sister    Cancer Brother    Colon cancer Neg Hx    Liver cancer Neg Hx    Pancreatic cancer Neg Hx    Esophageal cancer Neg Hx    Stomach cancer Neg Hx   Reviewed on admission  Prior to Admission medications   Medication Sig Start Date End Date Taking? Authorizing Provider  aspirin EC 81 MG tablet Take 1 tablet (81 mg total) by mouth daily. Swallow whole. 01/03/23   Freada Bergeron, MD  escitalopram (LEXAPRO) 5  MG tablet Take 5 mg by mouth daily. 11/09/21   [provider]  indapamide (LOZOL) 1.25 MG tablet Take 1 tablet (1.25 mg total) by mouth daily. 12/07/21   Belva Crome, MD  irbesartan (AVAPRO) 300 MG tablet TAKE 1 TABLET BY MOUTH EVERY DAY 08/30/22   Belva Crome, MD  REPATHA SURECLICK [id number] MG/ML SOAJ INJECT 140 MG INTO THE SKIN EVERY 14 (FOURTEEN) DAYS. 03/29/22   Belva Crome, MD  sildenafil (VIAGRA) 100 MG tablet Take 100 mg by mouth daily as needed. 06/17/21   [provider]  Sofosbuvir-Velpatasvir (EPCLUSA) 400-100 MG TABS Take 1 tablet by mouth daily. Patient not taking: Reported on 09/08/2022 02/04/22   Esmond Plants, RPH-CPP  spironolactone (ALDACTONE) 25 MG tablet Take 1 tablet (25 mg total) by mouth daily. Patient not taking: Reported on 01/24/2023 12/03/21   Belva Crome, MD  traMADol (ULTRAM) 50 MG tablet Take 1 tablet (50 mg total) by mouth every 12 (twelve) hours as needed. Patient not taking: Reported on 01/24/2023 11/29/22   Chase Picket, MD    Physical Exam: Vitals:   01/29/23 1345 01/29/23 1400 01/29/23 1415 01/29/23 1430  BP: (!) 135/58 (!) 152/61 (!) 156/64 (!) 141/62  Pulse: (!) 51 (!) 48 (!) 45 (!) 32  Resp:    18  Temp:      TempSrc:      SpO2: 100% 100% 100% 96%  Weight:      Height:        Physical Exam Constitutional:      General: He is not in acute distress.    Appearance: Normal appearance. He is obese.  HENT:     Head: Normocephalic and atraumatic.     Mouth/Throat:     Mouth: Mucous membranes are moist.     Pharynx: Oropharynx is clear.  Eyes:     Extraocular Movements: Extraocular movements intact.     Pupils: Pupils are equal, round, and reactive to light.  Cardiovascular:     Rate and Rhythm: Regular rhythm. Bradycardia present.     Pulses: Normal pulses.     Heart sounds: Normal heart sounds.  Pulmonary:     Effort: Pulmonary effort is normal. No respiratory distress.     Breath sounds: Normal breath sounds.   Abdominal:     General: Bowel sounds are normal. There is no distension.     Palpations: Abdomen is soft.     Tenderness: There is no abdominal tenderness.  Musculoskeletal:        General: No swelling or deformity.  Skin:    General: Skin is warm and dry.  Neurological:     General: No focal deficit present.     Mental Status: Mental status is at baseline.     Labs on Admission: I have personally reviewed following labs  and imaging studies  CBC: Recent Labs  Lab 01/29/23 0916  WBC 5.6  HGB 14.8  HCT 43.6  MCV 88.1  PLT PLATELET CLUMPS NOTED ON SMEAR, UNABLE TO ESTIMATE    Basic Metabolic Panel: Recent Labs  Lab 01/29/23 0916  NA 135  K 4.4  CL 102  CO2 24  GLUCOSE 117*  BUN 26*  CREATININE 1.34*  CALCIUM 9.5    GFR: Estimated Creatinine Clearance: 60.3 mL/min (A) (by C-G formula based on SCr of 1.34 mg/dL (H)).  Liver Function Tests: Recent Labs  Lab 01/29/23 0916  AST 30  ALT 34  ALKPHOS 49  BILITOT 0.8  PROT 8.9*  ALBUMIN 4.3    Urine analysis:    Component Value Date/Time   COLORURINE YELLOW 08/09/2022 [place] 08/09/2022 0844   LABSPEC 1.013 08/09/2022 0844   PHURINE 5.0 08/09/2022 0844   GLUCOSEU NEGATIVE 08/09/2022 0844   GLUCOSEU NEGATIVE 06/28/2022 1637   HGBUR NEGATIVE 08/09/2022 0844   BILIRUBINUR NEGATIVE 08/09/2022 0844   BILIRUBINUR neg 12/18/2014 1725   [place] 08/09/2022 0844   PROTEINUR NEGATIVE 08/09/2022 0844   UROBILINOGEN 1.0 06/28/2022 1637   NITRITE NEGATIVE 08/09/2022 0844   LEUKOCYTESUR NEGATIVE 08/09/2022 0844    Radiological Exams on Admission: CT Angio Head Neck W WO CM  Result Date: 01/29/2023 CLINICAL DATA:  Chest pain radiating to left arm with facial numbness EXAM: CT ANGIOGRAPHY HEAD AND NECK TECHNIQUE: Multidetector CT imaging of the head and neck was performed using the standard protocol during bolus administration of intravenous contrast. Multiplanar CT image reconstructions  and MIPs were obtained to evaluate the vascular anatomy. Carotid stenosis measurements (when applicable) are obtained utilizing NASCET criteria, using the distal internal carotid diameter as the denominator. RADIATION DOSE REDUCTION: This exam was performed according to the departmental dose-optimization program which includes automated exposure control, adjustment of the mA and/or kV according to patient size and/or use of iterative reconstruction technique. CONTRAST:  20m OMNIPAQUE IOHEXOL 350 MG/ML SOLN COMPARISON:  MRA neck 01/18/2023, noncontrast CT head 08/09/2022 FINDINGS: CT HEAD FINDINGS Brain: There is no acute intracranial hemorrhage, extra-axial fluid collection, or acute infarct. Parenchymal volume is normal. The ventricles are normal in size. Gray-white differentiation is preserved. The pituitary and suprasellar region are normal. There is no mass lesion there is no mass effect or midline shift. Vascular: See below. Skull: Normal. Negative for fracture or focal lesion. Sinuses/Orbits: The paranasal sinuses are essentially clear. The globes and orbits are unremarkable. Review of the MIP images confirms the above findings CTA NECK FINDINGS Aortic arch: There is mild calcified plaque in the imaged aortic arch. The origins of the major branch vessels are patent. The subclavian arteries are patent to the level imaged. Right carotid system: The right common carotid artery is patent. There is bulky calcified plaque at the bifurcation resulting in critical stenosis of the proximal internal carotid artery with relatively diminutive caliber of the distal internal carotid artery. The external carotid artery is patent. There is no raised dissection flap or aneurysm/pseudoaneurysm. Left carotid system: The left common carotid artery is patent. There is calcified plaque in the proximal internal carotid artery resulting in up to approximately 30% stenosis. The distal internal carotid artery is patent. The external  carotid artery is patent. There is no raised dissection flap or aneurysm/pseudoaneurysm. Vertebral arteries: The vertebral arteries are patent, without hemodynamically significant stenosis or occlusion. There is no evidence of dissection or aneurysm. Skeleton: There is no acute osseous abnormality or  suspicious osseous lesion. There is no visible canal hematoma. Other neck: The soft tissues of the neck are unremarkable. Upper chest: The imaged lung apices are clear. Review of the MIP images confirms the above findings CTA HEAD FINDINGS Anterior circulation: There is calcified plaque in the carotid siphons resulting in moderate to severe stenosis of the bilateral paraclinoid segments. The right M1 segment and distal branches are patent, without proximal stenosis or occlusion. The left M1 segment and distal branches are patent, without proximal stenosis or occlusion. 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 The left A1 segment is patent. The distal ACA branches are patent, without proximal stenosis or occlusion. The anterior communicating artery is normal. There is a 2-3 mm laterally directed outpouching arising from the communicating segment of the left ICA just proximal to the M1 segment suspicious for aneurysm (12-122, 13-102). Posterior circulation: There is calcified plaque in the proximal right V4 segment resulting in mild stenosis. The right V4 segment after the PICA origin is diminutive, favored developmental. The dominant left V4 segment is patent. PICA is identified bilaterally. The basilar artery is patent. The other major cerebellar arteries are patent. The bilateral PCAs are patent, without proximal stenosis or occlusion. A small right posterior communicating artery is identified There is no posterior circulation aneurysm. Venous sinuses: Patent. Anatomic variants: As above. Review of the MIP  images confirms the above findings IMPRESSION: 1. No acute intracranial pathology on noncontrast head CT. 2. Critical stenosis of the right ICA at the bifurcation with diminutive caliber of the distal ICA in the neck. 3. Approximately 30% stenosis of the proximal left ICA. Patent vertebral arteries in the neck. 4. Calcified plaque in the carotid siphons resulting in moderate to severe stenosis bilaterally. Otherwise, patent intracranial vasculature without other proximal high-grade stenosis or occlusion. 5. Suspect 2-3 mm aneurysm arising from the communicating segment of the left ICA. Electronically Signed   By: Valetta Mole M.D.   On: 01/29/2023 13:42   DG Chest Port 1 View  Result Date: 01/29/2023 CLINICAL DATA:  Chest pain EXAM: PORTABLE CHEST 1 VIEW COMPARISON:  09/20/2022 FINDINGS: Artifact from EKG leads. Borderline heart size. CABG. There is no edema, consolidation, effusion, or pneumothorax. IMPRESSION: No evidence of active disease. Electronically Signed   By: Jorje Guild M.D.   On: 01/29/2023 09:54    EKG: Independently reviewed.  Sinus rhythm at 61 bpm.  Nonspecific T wave flattening.  Assessment/Plan Principal Problem:   Chest pain, rule out acute myocardial infarction Active Problems:   Coronary artery disease involving coronary bypass graft of native heart without angina pectoris   Essential hypertension   Dyslipidemia   Chronic alcohol abuse   PTSD (post-traumatic stress disorder)   Chronic diastolic heart failure (HCC)   Alcoholic cirrhosis of liver without ascites (HCC)   Chronic hepatitis C without hepatic coma (HCC)   OSA on CPAP   Chest pain, rule out ACS CAD status post CABG > History of CABG in 2006.  Do not see records of intervention since then. > Episode of chest tightness with sweaty palms and bilateral face numbness today. > EKG nonischemic and troponin negative x 2. > He does have significant cardiac history and years will be needing surgery for his carotid  stenosis given this he was apparently anxious and wanted to stay in the hospital for further workup. > Cardiology consulted, initial for the possibility of sending patient home with negative troponins, but  after speaking with patient we will observe overnight, cardiology has been kind enough to agree to leave recommendations. - Monitor on telemetry - Appreciate cardiology recommendations - Continue to trend troponin  Carotid artery disease > Had his carotid artery his workup finished while in the ED with CTA of the head and neck showing critical stenosis of the proximal internal carotid artery with diminutive caliber of the distal internal carotid artery.  This was discussed with the vascular surgery team by EDP who will arrange for outpatient follow-up. - Continue home aspirin  Facial paresthesias? > Has had a myriad of intermittent symptoms in the past per HPI.  Do not suspect any recent CVAs nor current CVA given his CT head did not show any evidence of stroke from previous episodes and currently symptoms have improved but were also initially bilateral. > Possibility that these symptoms have been related to anxiety as he does have significant anxiety as well. - Continue to monitor  Anxiety PTSD Depression - Continue home Lexapro  Diastolic CHF > Last echo in 2023 with EF 60-65%, G1 DD, normal RV function. - Not currently on a loop diuretics - Continue home indapamide  Hypertension - Continue home indapamide and irbesartan  OSA - Continue home CPAP  Alcohol use History hepatitis C Compensated alcoholic cirrhosis > Hepatitis C status posttreatment.  Compensated alcoholic cirrhosis. - Noted  DVT prophylaxis: Lovenox Code Status:   Full Family Communication:  Updated at bedside  Disposition Plan:   Patient is from:  Home  Anticipated DC to:  Home  Anticipated DC date:  2 days  Anticipated DC barriers: None  Consults called:  Cardiology, vascular surgery (spoke with EDP by  phone, no formal consult) Admission status:  Observation, telemetry  Severity of Illness: The appropriate patient status for this patient is OBSERVATION. Observation status is judged to be reasonable and necessary in order to provide the required intensity of service to ensure the patient's safety. The patient's presenting symptoms, physical exam findings, and initial radiographic and laboratory data in the context of their medical condition is felt to place them at decreased risk for further clinical deterioration. Furthermore, it is anticipated that the patient will be medically stable for discharge from the hospital within 2 midnights of admission.    Marcelyn Bruins MD [facility]  How to contact the [facility] Attending or Consulting provider [place] or covering provider during after hours [place], for this patient?   Check the care team in [facility] and look for a) attending/consulting [facility] provider listed and b) the [facility] team listed Log into [email or web address] and use [place]'s universal password to access. If you do not have the password, please contact the hospital operator. Locate the [facility] provider you are looking for under [facility] and page to a number that you can be directly reached. If you still have difficulty reaching the provider, please page the [facility] (Director on Call) for the Hospitalists listed on amion for assistance.  01/29/2023, 3:17 PM

## 2023-01-29 NOTE — Consult Note (Signed)
Cardiology Consultation   Patient ID: VENCE BAHN MRN: [id number]; DOB: 1946-05-14  Admit date: 01/29/2023 Date of Consult: 01/29/2023  PCP:  Horald Pollen, [facility] Cardiologist:  Sinclair Grooms, MD (Inactive)   Patient Profile:   [name] is a 77 y.o. male with a history of CAD s/p CABG x3 (LIMA-LAD, left radial-OM1, SVG-D1) with early graft failure and redo CABG in 01/2005, critical carotid artery stenosis, hypertension, hyperlipidemia, chronic low back pain, PTSD, and obesity who is being seen 01/29/2023 for the evaluation of chest pain at the request of Dr. Trilby Drummer.  History of Present Illness:   [name] is a 77 year old male with the above history who was previously followed by Dr. Tamala Julian prior to his retirement and now follows with Dr. Johney Frame. He has a long history of CAD. He underwent CABG x3 with LIMA to LAD, left radial to OM1, and SVG to 1st Diag in 01/2005. He unfortunately had a early graft failure due to mechanical complication of LIMA to LAD requiring redo CABG a few days later. It does not look like he has had any repeat cardiac catheterization since that time. Last ischemic evaluation was a Myoview in 2016 which was low risk. Last Echo in 11/2021 showed LVEF of 60-65% with no regional wall motion abnormalities and grade 1 diastolic dysfunction as well as moderate thickening and calcification of aortic valve but no aortic stenosis and mild dilatation of the ascending aorta measuring 39 mm.  Patient was last seen by Dr. Tamala Julian in 07/2022 at which time he was doing well with no angina. Routine carotid dopplers on 12/30/2022 showed 60-79% stenosis of right ICA but distal ICA was difficult to visualize and demonstrated high-resistant flow consistent with possible distal occlusion. CTA of the neck was ordered for further evaluation. However, patient was worried about the effect of the contrast dye on his kidneys so this was switched to a MRA  of neck. MRA on 01/18/2023 showed critical stenosis vs short segment occlusion involving the proximal right ICA with downstream underfilling. CTA was recommended for further evaluation.  Patient presented to the ED today for further evaluation of chest pain and shortness of breath. Upon arrival to the ED, EKG showed normal sinus rhythm with non-specific T wave changes in leads I and aVL. High-sensitivity troponin negative x2. WBC 5.6, Hgb 14.8, platelet unable to be calculated due to platelet clumps on smear. Na 135, K 4.4, Glucose 117, BUN 26, Cr 1.34. LFTs normal.  He admits that he has been very anxious since he was told about the severity of his carotid stenosis.  He has also been concerned about low recorded heart rates and diastolic blood pressures on his home monitor, although these have not been associated with symptoms.  He has seen diastolic blood pressures in the 50s and occasional heart rates in the 40s.  He tells me that he used to be on a beta-blocker in the past but this was stopped due to the heart rate (judging by the epic records he has not been on beta-blocker since 2021).  The episode of chest discomfort earlier this morning  was associated with a sensation of flushing and diaphoresis.  He had "sweaty hands and sweaty feet".  He did not lose consciousness but felt a little woozy.  He also felt like somebody was "standing on his chest" similar to previous episodes of angina before he had bypass surgery.  On the other hand, he never  has chest discomfort with activity.  He still works as a Barrister's clerk and walks long distances in the car a lot every day without any dyspnea or angina.  He will also lift weights at home for exercise and does not have angina during that either.  He describes daytime hypersomnolence.  He has nodded off and dropped his phone several times in a single day.  He reports faithful compliance with CPAP, but he has not had any follow-up in sleep clinic in many years  (ever since the device was initially prescribed).   Past Medical History:  Diagnosis Date   Anginal pain ([place]) 01/2005   Anxiety    Arthritis    "knees"   Chronic lower back pain    Colon polyp    Coronary artery disease    Depression    Hepatitis    "don't know what kind; they treated me for it; was a long time ago" (12/11/2018)   High cholesterol    History of bronchitis    "used to get it alot" (08/25/2012)   Hypertension    Panic attacks    PTSD (post-traumatic stress disorder)    "have been treated in the past" (08/25/2012)    Past Surgical History:  Procedure Laterality Date   CARDIAC CATHETERIZATION  01/2005   CORONARY ARTERY BYPASS GRAFT  01/2005   CABG X3   Shrapnel Left 1969   LLE; left lateral thumb (required grafting)   VARICOSE VEIN SURGERY  1980's   LLE     Home Medications:  Prior to Admission medications   Medication Sig Start Date End Date Taking? Authorizing Provider  aspirin EC 81 MG tablet Take 1 tablet (81 mg total) by mouth daily. Swallow whole. 01/03/23   Freada Bergeron, MD  escitalopram (LEXAPRO) 5 MG tablet Take 5 mg by mouth daily. 11/09/21   [provider]  indapamide (LOZOL) 1.25 MG tablet Take 1 tablet (1.25 mg total) by mouth daily. 12/07/21   Belva Crome, MD  irbesartan (AVAPRO) 300 MG tablet TAKE 1 TABLET BY MOUTH EVERY DAY 08/30/22   Belva Crome, MD  REPATHA SURECLICK [id number] MG/ML SOAJ INJECT 140 MG INTO THE SKIN EVERY 14 (FOURTEEN) DAYS. 03/29/22   Belva Crome, MD  sildenafil (VIAGRA) 100 MG tablet Take 100 mg by mouth daily as needed. 06/17/21   [provider]  Sofosbuvir-Velpatasvir (EPCLUSA) 400-100 MG TABS Take 1 tablet by mouth daily. Patient not taking: Reported on 09/08/2022 02/04/22   Esmond Plants, RPH-CPP  spironolactone (ALDACTONE) 25 MG tablet Take 1 tablet (25 mg total) by mouth daily. Patient not taking: Reported on 01/24/2023 12/03/21   Belva Crome, MD  traMADol (ULTRAM) 50 MG tablet Take 1 tablet (50  mg total) by mouth every 12 (twelve) hours as needed. Patient not taking: Reported on 01/24/2023 11/29/22   Chase Picket, MD    Inpatient Medications: Scheduled Meds:  acetaminophen  1,000 mg Oral Once   [START ON 01/30/2023] aspirin EC  81 mg Oral Daily   enoxaparin (LOVENOX) injection  0.5 mg/kg Subcutaneous Q24H   [START ON 01/30/2023] indapamide  1.25 mg Oral Daily   [START ON 01/30/2023] irbesartan  300 mg Oral Daily   Continuous Infusions:  PRN Meds: acetaminophen, ondansetron (ZOFRAN) IV  Allergies:    Allergies  Allergen Reactions   Codeine Palpitations   Vicodin [Hydrocodone-Acetaminophen] Nausea And Vomiting, Swelling and Palpitations    Social History:   Social History   Socioeconomic History  Marital status: Divorced    Spouse name: Not on file   Number of children: Not on file   Years of education: Not on file   Highest education level: Not on file  Occupational History   Not on file  Tobacco Use   Smoking status: Former    Packs/day: 2.00    Years: 5.00    Total pack years: 10.00    Types: Cigarettes   Smokeless tobacco: Never   Tobacco comments:    08/25/2012 "quit smoking cigarettes 40 yr ago"  Vaping Use   Vaping Use: Never used  Substance and Sexual Activity   Alcohol use: Not Currently    Alcohol/week: 60.0 standard drinks of alcohol    Types: 60 Standard drinks or equivalent per week    Comment: 12/11/2018 "~ 1 pint of vodka/day"   Drug use: Not Currently    Types: Marijuana   Sexual activity: Yes  Other Topics Concern   Not on file  Social History Narrative   Not on file   Social Determinants of Health   Financial Resource Strain: Low Risk  (07/14/2022)   Overall Financial Resource Strain (CARDIA)    Difficulty of Paying Living Expenses: Not hard at all  Food Insecurity: No Food Insecurity (07/14/2022)   Hunger Vital Sign    Worried About Running Out of Food in the Last Year: Never true    Ran Out of Food in the Last Year: Never true   Transportation Needs: No Transportation Needs (07/14/2022)   PRAPARE - Hydrologist (Medical): No    Lack of Transportation (Non-Medical): No  Physical Activity: Sufficiently Active (07/14/2022)   Exercise Vital Sign    Days of Exercise per Week: 6 days    Minutes of Exercise per Session: 60 min  Stress: No Stress Concern Present (07/14/2022)   [place]    Feeling of Stress : Not at all  Social Connections: Moderately Integrated (07/14/2022)   Social Connection and Isolation Panel [NHANES]    Frequency of Communication with Friends and Family: More than three times a week    Frequency of Social Gatherings with Friends and Family: More than three times a week    Attends Religious Services: More than 4 times per year    Active Member of [employer] or Organizations: Yes    Attends Music therapist: More than 4 times per year    Marital Status: Divorced  Intimate Partner Violence: Not At Risk (07/14/2022)   Humiliation, Afraid, Rape, and Kick questionnaire    Fear of Current or Ex-Partner: No    Emotionally Abused: No    Physically Abused: No    Sexually Abused: No    Family History:    Family History  Problem Relation Age of Onset   Diabetes Mother    Heart disease Father    Sleep apnea Sister    Cancer Brother    Colon cancer Neg Hx    Liver cancer Neg Hx    Pancreatic cancer Neg Hx    Esophageal cancer Neg Hx    Stomach cancer Neg Hx      ROS:  Please see the history of present illness.   All other ROS reviewed and negative.     Physical Exam/Data:   Vitals:   01/29/23 1345 01/29/23 1400 01/29/23 1415 01/29/23 1430  BP: (!) 135/58 (!) 152/61 (!) 156/64 (!) 141/62  Pulse: (!) 51 (!) 48 (!)  45 (!) 32  Resp:    18  Temp:      TempSrc:      SpO2: 100% 100% 100% 96%  Weight:      Height:       No intake or output data in the 24 hours ending 01/29/23 1528     01/29/2023    8:55 AM 01/24/2023   10:22 AM 09/08/2022   10:46 AM  Last 3 Weights  Weight (lbs) 244 lb 243 lb 241 lb 6 oz  Weight (kg) 110.678 kg 110.224 kg 109.487 kg     Body mass index is 34.03 kg/m.   Physical Exam per MD:  General:  Well nourished, well developed, in no acute distress appears younger than stated age 45: normal Neck: no JVD Vascular: Bilateral carotid bruits, but no carotid delay; Distal pulses 2+ bilaterally Cardiac:  normal S1, S2; RRR; no diastolic murmur; has a grade 1-2/6 mid peaking systolic murmur (bruit?)  That is heard both to the right and the left of the upper sternum. Lungs:  clear to auscultation bilaterally, no wheezing, rhonchi or rales  Abd: soft, nontender, no hepatomegaly  Ext: no edema Musculoskeletal:  No deformities, BUE and BLE strength normal and equal Skin: warm and dry  Neuro:  CNs 2-12 intact, no focal abnormalities noted Psych:  Normal affect   EKG:  The EKG was personally reviewed and demonstrates:  Normal sinus rhythm with non-specific ST/T changes. Telemetry:  Telemetry was personally reviewed and demonstrates: Mostly normal sinus rhythm, with periods of gradual sinus bradycardia down to a minimum of 45 bpm.  Relevant CV Studies:  Echocardiogram 12/25/2021: Impressions: 1. Left ventricular ejection fraction, by estimation, is 60 to 65%. The  left ventricle has normal function. The left ventricle has no regional  wall motion abnormalities. Left ventricular diastolic parameters are  consistent with Grade I diastolic  dysfunction (impaired relaxation).   2. Right ventricular systolic function is normal. The right ventricular  size is normal.   3. Left atrial size was mildly dilated.   4. The mitral valve is abnormal. Trivial mitral valve regurgitation. No  evidence of mitral stenosis.   5. Marked calcification of the non coronary cusp. The aortic valve is  normal in structure. There is moderate calcification of the aortic  valve.  There is moderate thickening of the aortic valve. Aortic valve  regurgitation is mild. Aortic valve  sclerosis/calcification is present, without any evidence of aortic  stenosis.   6. Aortic dilatation noted. There is mild dilatation of the ascending  aorta, measuring 39 mm.   7. The inferior vena cava is normal in size with greater than 50%  respiratory variability, suggesting right atrial pressure of 3 mmHg.  _______________  Carotid Dopplers 12/30/2022: Summary:  - Right Carotid: Velocities in the right ICA are consistent with a 60-79% stenosis. Difficult insonation of the right ICA, velocities may be higher than able to obtain. The distal ICA was difficult to visualize and demonstrates high-resistant flow consistent with possible distal occlusion. The ECA appears >50% stenosed. Suggest alternative imaging to further evaluate level of disease.  - Left Carotid: Velocities in the left ICA are consistent with a 1-39% stenosis.  - Vertebrals: Bilateral vertebral arteries demonstrate antegrade flow.  - Subclavians: Normal flow hemodynamics were seen in bilateral subclavian arteries.   Neck MRA 01/18/2023: Impressions; 1. Limited noncontrast MRA, the patient declined contrast. 2. Critical stenosis versus short segment occlusion involving the proximal right ICA with downstream underfilling. Recommend further assessment to  include intracranial angiography. CTA or catheter angiogram can be considered given complicating renal history.  Laboratory Data:  High Sensitivity Troponin:   Recent Labs  Lab 01/29/23 0916 01/29/23 1130  TROPONINIHS 6 6     Chemistry Recent Labs  Lab 01/29/23 0916  NA 135  K 4.4  CL 102  CO2 24  GLUCOSE 117*  BUN 26*  CREATININE 1.34*  CALCIUM 9.5  GFRNONAA 55*  ANIONGAP 9    Recent Labs  Lab 01/29/23 0916  PROT 8.9*  ALBUMIN 4.3  AST 30  ALT 34  ALKPHOS 49  BILITOT 0.8   Lipids No results for input(s): "CHOL", "TRIG", "HDL", "LABVLDL",  "LDLCALC", "CHOLHDL" in the last 168 hours.  Hematology Recent Labs  Lab 01/29/23 0916  WBC 5.6  RBC 4.95  HGB 14.8  HCT 43.6  MCV 88.1  MCH 29.9  MCHC 33.9  RDW 12.3  PLT PLATELET CLUMPS NOTED ON SMEAR, UNABLE TO ESTIMATE   Thyroid No results for input(s): "TSH", "FREET4" in the last 168 hours.  BNPNo results for input(s): "BNP", "PROBNP" in the last 168 hours.  DDimer No results for input(s): "DDIMER" in the last 168 hours.   Radiology/Studies:  CT Angio Head Neck W WO CM  Result Date: 01/29/2023 CLINICAL DATA:  Chest pain radiating to left arm with facial numbness EXAM: CT ANGIOGRAPHY HEAD AND NECK TECHNIQUE: Multidetector CT imaging of the head and neck was performed using the standard protocol during bolus administration of intravenous contrast. Multiplanar CT image reconstructions and MIPs were obtained to evaluate the vascular anatomy. Carotid stenosis measurements (when applicable) are obtained utilizing NASCET criteria, using the distal internal carotid diameter as the denominator. RADIATION DOSE REDUCTION: This exam was performed according to the departmental dose-optimization program which includes automated exposure control, adjustment of the mA and/or kV according to patient size and/or use of iterative reconstruction technique. CONTRAST:  61m OMNIPAQUE IOHEXOL 350 MG/ML SOLN COMPARISON:  MRA neck 01/18/2023, noncontrast CT head 08/09/2022 FINDINGS: CT HEAD FINDINGS Brain: There is no acute intracranial hemorrhage, extra-axial fluid collection, or acute infarct. Parenchymal volume is normal. The ventricles are normal in size. Gray-white differentiation is preserved. The pituitary and suprasellar region are normal. There is no mass lesion there is no mass effect or midline shift. Vascular: See below. Skull: Normal. Negative for fracture or focal lesion. Sinuses/Orbits: The paranasal sinuses are essentially clear. The globes and orbits are unremarkable. Review of the MIP images  confirms the above findings CTA NECK FINDINGS Aortic arch: There is mild calcified plaque in the imaged aortic arch. The origins of the major branch vessels are patent. The subclavian arteries are patent to the level imaged. Right carotid system: The right common carotid artery is patent. There is bulky calcified plaque at the bifurcation resulting in critical stenosis of the proximal internal carotid artery with relatively diminutive caliber of the distal internal carotid artery. The external carotid artery is patent. There is no raised dissection flap or aneurysm/pseudoaneurysm. Left carotid system: The left common carotid artery is patent. There is calcified plaque in the proximal internal carotid artery resulting in up to approximately 30% stenosis. The distal internal carotid artery is patent. The external carotid artery is patent. There is no raised dissection flap or aneurysm/pseudoaneurysm. Vertebral arteries: The vertebral arteries are patent, without hemodynamically significant stenosis or occlusion. There is no evidence of dissection or aneurysm. Skeleton: There is no acute osseous abnormality or suspicious osseous lesion. There is no visible canal hematoma. Other neck: The soft tissues  of the neck are unremarkable. Upper chest: The imaged lung apices are clear. Review of the MIP images confirms the above findings CTA HEAD FINDINGS Anterior circulation: There is calcified plaque in the carotid siphons resulting in moderate to severe stenosis of the bilateral paraclinoid segments. The right M1 segment and distal branches are patent, without proximal stenosis or occlusion. The left M1 segment and distal branches are patent, without proximal stenosis or occlusion. 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 The left A1 segment is patent. The distal ACA branches are patent, without proximal stenosis or  occlusion. The anterior communicating artery is normal. There is a 2-3 mm laterally directed outpouching arising from the communicating segment of the left ICA just proximal to the M1 segment suspicious for aneurysm (12-122, 13-102). Posterior circulation: There is calcified plaque in the proximal right V4 segment resulting in mild stenosis. The right V4 segment after the PICA origin is diminutive, favored developmental. The dominant left V4 segment is patent. PICA is identified bilaterally. The basilar artery is patent. The other major cerebellar arteries are patent. The bilateral PCAs are patent, without proximal stenosis or occlusion. A small right posterior communicating artery is identified There is no posterior circulation aneurysm. Venous sinuses: Patent. Anatomic variants: As above. Review of the MIP images confirms the above findings IMPRESSION: 1. No acute intracranial pathology on noncontrast head CT. 2. Critical stenosis of the right ICA at the bifurcation with diminutive caliber of the distal ICA in the neck. 3. Approximately 30% stenosis of the proximal left ICA. Patent vertebral arteries in the neck. 4. Calcified plaque in the carotid siphons resulting in moderate to severe stenosis bilaterally. Otherwise, patent intracranial vasculature without other proximal high-grade stenosis or occlusion. 5. Suspect 2-3 mm aneurysm arising from the communicating segment of the left ICA. Electronically Signed   By: Valetta Mole M.D.   On: 01/29/2023 13:42   DG Chest Port 1 View  Result Date: 01/29/2023 CLINICAL DATA:  Chest pain EXAM: PORTABLE CHEST 1 VIEW COMPARISON:  09/20/2022 FINDINGS: Artifact from EKG leads. Borderline heart size. CABG. There is no edema, consolidation, effusion, or pneumothorax. IMPRESSION: No evidence of active disease. Electronically Signed   By: Jorje Guild M.D.   On: 01/29/2023 09:54     Assessment and Plan:   Chest Pain CAD s/p CABG Patient has a history of CABG x3 in  01/2005 with early graft failure requiring redo CABG a few days later. Last ischemic evaluation was a Myoview in 2016 which was low risk. He now presents with chest pressure after what sounds like a vasovagal episode. However, he denies any exertional symptoms during intense physical activity.  EKG shows no acute ischemic changes. High-sensitivity troponin negative x2.  The suspicion for an acute coronary syndrome is low and we will not start intravenous anticoagulants at this time.   Scheduled for nuclear perfusion study on Monday. Continue aspirin. On Repatha at home.   Sinus Bradycardia Patient noted to have occasional sinus bradycardia in the 40s. Not on any negative chronotropic agents.  I suspect that he has sinus node dysfunction.  It sounds like the episode of chest discomfort this morning may have been associated with severe bradycardia, maybe with a vasovagal component (flushing, sweaty palms and soles).  He has not experienced full-blown syncope.  OSA He has sleep apnea and uses CPAP but describes significant daytime somnolence. Likely needs repeat sleep study and adjustment of CPAP as an  outpatient. Continue to monitor on telemetry.  Pre-Op Evaluation Patient was recently found to have critical stenosis of right ICA and will probably need surgery. Will plan for Myoview on Monday as above, given history of CABG and chest pain as above.  Hypertension BP mildly elevated. Continue home Irbesartan '300mg'$  daily.   Hyperlipidemia LDL 55 in 11/2021. On Repatha at home.  Target LDL less than 70.   Risk Assessment/Risk Scores:    For questions or updates, please contact [place] Please consult [email or web address] for contact info under    Signed, Darreld Mclean, PA-C  01/29/2023 3:28 PM

## 2023-01-29 NOTE — ED Provider Notes (Signed)
[facility] Provider Note   CSN: [id number] Arrival date & time: 01/29/23  [id number]     History  Chief Complaint  Patient presents with   Chest Pain   Shortness of Breath    [name] is a 77 y.o. male.  Pt c/o feeling sweaty in his palms bilateral, and chest tightness, and indicates face (bilaterally) felt numb/tingly. Symptoms acute onset today at rest, constant.  Denies any exertional chest pain. No radiating chest pain. No pleuritic chest pain. No associated sob, nv or palpitations. Denies cough or uri symptoms. No fever or chills. No abd pain. No neck pain. No back pain. No extremity pain or swelling. No hx dvt or pe. Remote hx cabg. Denies any change in speech or vision. No one-sided numbness or weakness. No problems w balance, gait, or loss of normal functional ability. Pt indicates was told has carotid occlusive disease, especially on right, indicates has angiogram scheduled but has been feeling anxious about having that done earlier, and requests it be done today.   The history is provided by the patient, medical records and the EMS personnel.  Chest Pain Associated symptoms: no abdominal pain, no back pain, no cough, no fever, no headache, no nausea, no palpitations, no shortness of breath, no vomiting and no weakness   Shortness of Breath Associated symptoms: chest pain   Associated symptoms: no abdominal pain, no cough, no fever, no headaches, no neck pain, no rash, no sore throat and no vomiting        Home Medications Prior to Admission medications   Medication Sig Start Date End Date Taking? Authorizing Provider  aspirin EC 81 MG tablet Take 1 tablet (81 mg total) by mouth daily. Swallow whole. 01/03/23   Freada Bergeron, MD  escitalopram (LEXAPRO) 5 MG tablet Take 5 mg by mouth daily. 11/09/21   [provider]  indapamide (LOZOL) 1.25 MG tablet Take 1 tablet (1.25 mg total) by mouth daily. 12/07/21   Belva Crome, MD  irbesartan (AVAPRO) 300 MG tablet TAKE 1 TABLET BY MOUTH EVERY DAY 08/30/22   Belva Crome, MD  REPATHA SURECLICK [id number] MG/ML SOAJ INJECT 140 MG INTO THE SKIN EVERY 14 (FOURTEEN) DAYS. 03/29/22   Belva Crome, MD  sildenafil (VIAGRA) 100 MG tablet Take 100 mg by mouth daily as needed. 06/17/21   [provider]  Sofosbuvir-Velpatasvir (EPCLUSA) 400-100 MG TABS Take 1 tablet by mouth daily. Patient not taking: Reported on 09/08/2022 02/04/22   Esmond Plants, RPH-CPP  spironolactone (ALDACTONE) 25 MG tablet Take 1 tablet (25 mg total) by mouth daily. Patient not taking: Reported on 01/24/2023 12/03/21   Belva Crome, MD  traMADol (ULTRAM) 50 MG tablet Take 1 tablet (50 mg total) by mouth every 12 (twelve) hours as needed. Patient not taking: Reported on 01/24/2023 11/29/22   Chase Picket, MD      Allergies    Codeine and Vicodin [hydrocodone-acetaminophen]    Review of Systems   Review of Systems  Constitutional:  Negative for fever.  HENT:  Negative for sore throat.   Eyes:  Negative for visual disturbance.  Respiratory:  Negative for cough and shortness of breath.   Cardiovascular:  Positive for chest pain. Negative for palpitations and leg swelling.  Gastrointestinal:  Negative for abdominal pain, nausea and vomiting.  Genitourinary:  Negative for flank pain.  Musculoskeletal:  Negative for back pain and neck pain.  Skin:  Negative for  rash.  Neurological:  Negative for speech difficulty, weakness and headaches.  Hematological:  Does not bruise/bleed easily.  Psychiatric/Behavioral:  Negative for confusion.     Physical Exam Updated Vital Signs BP (!) 146/68   Pulse (!) 48   Temp 97.9 F (36.6 C)   Resp 18   Ht 1.803 m ('5\' 11"'$ )   Wt 110.7 kg   SpO2 100%   BMI 34.03 kg/m  Physical Exam Vitals and nursing note reviewed.  Constitutional:      Appearance: Normal appearance. He is well-developed.  HENT:     Head: Atraumatic.     Nose: Nose normal.      Mouth/Throat:     Mouth: Mucous membranes are moist.     Pharynx: Oropharynx is clear.  Eyes:     General: No scleral icterus.    Conjunctiva/sclera: Conjunctivae normal.     Pupils: Pupils are equal, round, and reactive to light.  Neck:     Vascular: No carotid bruit.     Trachea: No tracheal deviation.  Cardiovascular:     Rate and Rhythm: Normal rate and regular rhythm.     Pulses: Normal pulses.     Heart sounds: Normal heart sounds. No murmur heard.    No friction rub. No gallop.  Pulmonary:     Effort: Pulmonary effort is normal. No accessory muscle usage or respiratory distress.     Breath sounds: Normal breath sounds.  Chest:     Chest wall: No tenderness.  Abdominal:     General: Bowel sounds are normal. There is no distension.     Palpations: Abdomen is soft.     Tenderness: There is no abdominal tenderness.  Genitourinary:    Comments: No cva tenderness. Musculoskeletal:        General: No swelling.     Cervical back: Normal range of motion and neck supple. No rigidity or tenderness.     Right lower leg: No edema.     Left lower leg: No edema.     Comments: C spine non tender, normal movement. No extremity pain or swelling noted. Bil radial pulses 2+. Bil upper extremities of normal color and warmth, no edema noted.   Skin:    General: Skin is warm and dry.     Findings: No rash.  Neurological:     Mental Status: He is alert.     Comments: Alert, speech clear. Motor/sens grossly intact bil. Stre 5/5. Steady gait.   Psychiatric:        Mood and Affect: Mood normal.     ED Results / Procedures / Treatments   Labs (all labs ordered are listed, but only abnormal results are displayed) Results for orders placed or performed during the hospital encounter of 01/29/23  CBC  Result Value Ref Range   WBC 5.6 4.0 - 10.5 K/uL   RBC 4.95 4.22 - 5.81 MIL/uL   Hemoglobin 14.8 13.0 - 17.0 g/dL   HCT 43.6 39.0 - 52.0 %   MCV 88.1 80.0 - 100.0 fL   MCH 29.9 26.0 - 34.0  pg   MCHC 33.9 30.0 - 36.0 g/dL   RDW 12.3 11.5 - 15.5 %   Platelets PLATELET CLUMPS NOTED ON SMEAR, UNABLE TO ESTIMATE 150 - 400 K/uL   nRBC 0.0 0.0 - 0.2 %  Comprehensive metabolic panel  Result Value Ref Range   Sodium 135 135 - 145 mmol/L   Potassium 4.4 3.5 - 5.1 mmol/L   Chloride 102 98 -  111 mmol/L   CO2 24 22 - 32 mmol/L   Glucose, Bld 117 (H) 70 - 99 mg/dL   BUN 26 (H) 8 - 23 mg/dL   Creatinine, Ser 1.34 (H) 0.61 - 1.24 mg/dL   Calcium 9.5 8.9 - 10.3 mg/dL   Total Protein 8.9 (H) 6.5 - 8.1 g/dL   Albumin 4.3 3.5 - 5.0 g/dL   AST 30 15 - 41 U/L   ALT 34 0 - 44 U/L   Alkaline Phosphatase 49 38 - 126 U/L   Total Bilirubin 0.8 0.3 - 1.2 mg/dL   GFR, Estimated 55 (L) >60 mL/min   Anion gap 9 5 - 15  Troponin I (High Sensitivity)  Result Value Ref Range   Troponin I (High Sensitivity) 6 <18 ng/L  Troponin I (High Sensitivity)  Result Value Ref Range   Troponin I (High Sensitivity) 6 <18 ng/L   CT Angio Head Neck W WO CM  Result Date: 01/29/2023 CLINICAL DATA:  Chest pain radiating to left arm with facial numbness EXAM: CT ANGIOGRAPHY HEAD AND NECK TECHNIQUE: Multidetector CT imaging of the head and neck was performed using the standard protocol during bolus administration of intravenous contrast. Multiplanar CT image reconstructions and MIPs were obtained to evaluate the vascular anatomy. Carotid stenosis measurements (when applicable) are obtained utilizing NASCET criteria, using the distal internal carotid diameter as the denominator. RADIATION DOSE REDUCTION: This exam was performed according to the departmental dose-optimization program which includes automated exposure control, adjustment of the mA and/or kV according to patient size and/or use of iterative reconstruction technique. CONTRAST:  63m OMNIPAQUE IOHEXOL 350 MG/ML SOLN COMPARISON:  MRA neck 01/18/2023, noncontrast CT head 08/09/2022 FINDINGS: CT HEAD FINDINGS Brain: There is no acute intracranial hemorrhage,  extra-axial fluid collection, or acute infarct. Parenchymal volume is normal. The ventricles are normal in size. Gray-white differentiation is preserved. The pituitary and suprasellar region are normal. There is no mass lesion there is no mass effect or midline shift. Vascular: See below. Skull: Normal. Negative for fracture or focal lesion. Sinuses/Orbits: The paranasal sinuses are essentially clear. The globes and orbits are unremarkable. Review of the MIP images confirms the above findings CTA NECK FINDINGS Aortic arch: There is mild calcified plaque in the imaged aortic arch. The origins of the major branch vessels are patent. The subclavian arteries are patent to the level imaged. Right carotid system: The right common carotid artery is patent. There is bulky calcified plaque at the bifurcation resulting in critical stenosis of the proximal internal carotid artery with relatively diminutive caliber of the distal internal carotid artery. The external carotid artery is patent. There is no raised dissection flap or aneurysm/pseudoaneurysm. Left carotid system: The left common carotid artery is patent. There is calcified plaque in the proximal internal carotid artery resulting in up to approximately 30% stenosis. The distal internal carotid artery is patent. The external carotid artery is patent. There is no raised dissection flap or aneurysm/pseudoaneurysm. Vertebral arteries: The vertebral arteries are patent, without hemodynamically significant stenosis or occlusion. There is no evidence of dissection or aneurysm. Skeleton: There is no acute osseous abnormality or suspicious osseous lesion. There is no visible canal hematoma. Other neck: The soft tissues of the neck are unremarkable. Upper chest: The imaged lung apices are clear. Review of the MIP images confirms the above findings CTA HEAD FINDINGS Anterior circulation: There is calcified plaque in the carotid siphons resulting in moderate to severe stenosis of  the bilateral paraclinoid segments. The right M1 segment  and distal branches are patent, without proximal stenosis or occlusion. The left M1 segment and distal branches are patent, without proximal stenosis or occlusion. 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 The left A1 segment is patent. The distal ACA branches are patent, without proximal stenosis or occlusion. The anterior communicating artery is normal. There is a 2-3 mm laterally directed outpouching arising from the communicating segment of the left ICA just proximal to the M1 segment suspicious for aneurysm (12-122, 13-102). Posterior circulation: There is calcified plaque in the proximal right V4 segment resulting in mild stenosis. The right V4 segment after the PICA origin is diminutive, favored developmental. The dominant left V4 segment is patent. PICA is identified bilaterally. The basilar artery is patent. The other major cerebellar arteries are patent. The bilateral PCAs are patent, without proximal stenosis or occlusion. A small right posterior communicating artery is identified There is no posterior circulation aneurysm. Venous sinuses: Patent. Anatomic variants: As above. Review of the MIP images confirms the above findings IMPRESSION: 1. No acute intracranial pathology on noncontrast head CT. 2. Critical stenosis of the right ICA at the bifurcation with diminutive caliber of the distal ICA in the neck. 3. Approximately 30% stenosis of the proximal left ICA. Patent vertebral arteries in the neck. 4. Calcified plaque in the carotid siphons resulting in moderate to severe stenosis bilaterally. Otherwise, patent intracranial vasculature without other proximal high-grade stenosis or occlusion. 5. Suspect 2-3 mm aneurysm arising from the communicating segment of the left ICA. Electronically Signed   By: Valetta Mole M.D.   On: 01/29/2023 13:42    DG Chest Port 1 View  Result Date: 01/29/2023 CLINICAL DATA:  Chest pain EXAM: PORTABLE CHEST 1 VIEW COMPARISON:  09/20/2022 FINDINGS: Artifact from EKG leads. Borderline heart size. CABG. There is no edema, consolidation, effusion, or pneumothorax. IMPRESSION: No evidence of active disease. Electronically Signed   By: Jorje Guild M.D.   On: 01/29/2023 09:54   MR ANGIO NECK WO CONTRAST  Result Date: 01/20/2023 CLINICAL DATA:  Carotid artery stenosis. EXAM: MRA NECK WITHOUT CONTRAST TECHNIQUE: Angiographic images of the neck were acquired using MRA technique without intravenous contrast. Carotid stenosis measurements (when applicable) are obtained utilizing NASCET criteria, using the distal internal carotid diameter as the denominator. COMPARISON:  None Available. FINDINGS: The patient declined contrast due to concern for renal impairment. Normal arch where covered with 3 vessel branching. Severe narrowing versus short segment occlusion at the origin of the right ICA with downstream underfilling. At the level of the skull base there is faint contrast. Atheromatous irregularity with mild narrowing at the left carotid bifurcation, without stenosis. Left dominant vertebral artery. Poor flow seen at the right V3 to V4 junction, likely directional dephasing. Symmetric poor visualization of the origins due to technical limitations. These results will be called to the ordering clinician or representative by the Radiologist Assistant, and communication documented in the PACS or [employer]. IMPRESSION: 1. Limited noncontrast MRA, the patient declined contrast. 2. Critical stenosis versus short segment occlusion involving the proximal right ICA with downstream underfilling. Recommend further assessment to include intracranial angiography. CTA or catheter angiogram can be considered given complicating renal history. Electronically Signed   By: Jorje Guild M.D.   On: 01/20/2023 09:03   VAS US  CAROTID  Result Date: 01/02/2023 Carotid Arterial Duplex Study Patient Name:  BELFORD CASCANTE [facility]  Date of Exam:   12/30/2022 Medical Rec #: [id number]  Accession #:    [id number] Date of Birth: Aug 20, 1946        Patient Gender: M Patient Age:   79 years Exam Location:  [facility] Procedure:      VAS US CAROTID Referring Phys: HEATHER PEMBERTON --------------------------------------------------------------------------------  Indications:                            Carotid artery disease. Patient reports                                         that he has been having some facial                                         numbness intermittently over the past                                         90 days or so. He mostly feels it when                                         he is laying down. Also falls asleep                                         very easily, sometimes he'll be sitting                                         up and just fall asleep and his phone                                         will drop out of his hands. Risk Factors:                           Hypertension, past history of smoking,                                         coronary artery disease. Limitations                             Today's exam was limited due to the                                         body habitus of the patient and heavy  calcification and the resulting                                         shadowing. Comparison Study:                       Previous carotid duplex performed                                         12/10/21 showed RICA velocities of                                         Q000111Q cm/sec and LICA velocities of                                         143/27 cm/sec. Pre-Surgical Evaluation & Surgical      Stenosis at right bifurcation only. ICA Correlation                             is normal past the stenosis. Anatomy on                                         the  right is within normal limits.Right                                         bifurcation is located near the Hyoid                                         Notch. Performing Technologist: Mariane Masters RVT  Examination Guidelines: A complete evaluation includes B-mode imaging, spectral Doppler, color Doppler, and power Doppler as needed of all accessible portions of each vessel. Bilateral testing is considered an integral part of a complete examination. Limited examinations for reoccurring indications may be performed as noted.  Right Carotid Findings: +----------+--------+--------+--------+---------------------+------------------+           PSV cm/sEDV cm/sStenosisPlaque Description   Comments           +----------+--------+--------+--------+---------------------+------------------+ CCA Prox  84      8                                                       +----------+--------+--------+--------+---------------------+------------------+ CCA Distal93      21                                                      +----------+--------+--------+--------+---------------------+------------------+  ICA Prox  256     86      60-79%  heterogenous and                                                          calcific                                +----------+--------+--------+--------+---------------------+------------------+ ICA Mid   72      15                                                      +----------+--------+--------+--------+---------------------+------------------+ ICA Distal54      11                                   Highl resistant                                                           flow               +----------+--------+--------+--------+---------------------+------------------+ ECA       348     39      >50%                                            +----------+--------+--------+--------+---------------------+------------------+  +----------+--------+-------+----------------+-------------------+           PSV cm/sEDV cmsDescribe        Arm Pressure (mmHG) +----------+--------+-------+----------------+-------------------+ Subclavian130     1      Multiphasic, [id number]                 +----------+--------+-------+----------------+-------------------+ +---------+--------+--+--------+--+---------+ VertebralPSV cm/s38EDV cm/s14Antegrade +---------+--------+--+--------+--+---------+ Velocities in the right ICA are elevated and appear higher than previous exam. Very difficult insonation of the right ICA, velocities may be higher. The distal ICA was difficult to visualize and demonstrates high-resistant flow, possible distal occlusion. Left Carotid Findings: +----------+--------+--------+--------+------------------+--------+           PSV cm/sEDV cm/sStenosisPlaque DescriptionComments +----------+--------+--------+--------+------------------+--------+ CCA Prox  109     11                                tortuous +----------+--------+--------+--------+------------------+--------+ CCA Distal131     28                                         +----------+--------+--------+--------+------------------+--------+ ICA Prox  90      21              heterogenous               +----------+--------+--------+--------+------------------+--------+  ICA Mid   92      26      1-39%                              +----------+--------+--------+--------+------------------+--------+ ICA Distal64      20                                         +----------+--------+--------+--------+------------------+--------+ ECA       163     10                                         +----------+--------+--------+--------+------------------+--------+ +----------+--------+--------+----------------+-------------------+           PSV cm/sEDV cm/sDescribe        Arm Pressure (mmHG)  +----------+--------+--------+----------------+-------------------+ Subclavian209     0       Multiphasic, [id number]                 +----------+--------+--------+----------------+-------------------+ +---------+--------+--+--------+--+---------+ VertebralPSV cm/s57EDV cm/s18Antegrade +---------+--------+--+--------+--+---------+    Findings reported to Dr. Johney Frame via staff message at 4:00 pm. Summary: Right Carotid: Velocities in the right ICA are consistent with a 60-79%                stenosis. Difficult insonation of the right ICA, velocities may                be higher than able to obtain. The distal ICA was difficult to                visualize and demonstrates high-resistant flow consistent with                possible distal occlusion. The ECA appears >50% stenosed. Suggest                alternative imaging to further evaluate level of disease. Left Carotid: Velocities in the left ICA are consistent with a 1-39% stenosis. Vertebrals:  Bilateral vertebral arteries demonstrate antegrade flow. Subclavians: Normal flow hemodynamics were seen in bilateral subclavian              arteries. *See table(s) above for measurements and observations.  Suggest Peripheral Vascular Consult. Electronically signed by Carlyle Dolly MD on 01/02/2023 at 7:48:59 PM.    Final     EKG EKG Interpretation  Date/Time:  Saturday January 29 2023 08:53:50 EST Ventricular Rate:  61 PR Interval:  213 QRS Duration: 109 QT Interval:  410 QTC Calculation: 413 R Axis:   65 Text Interpretation: Sinus rhythm Nonspecific ST abnormality No significant change since last tracing Confirmed by Lajean Saver [phone]) on 01/29/2023 8:56:26 AM  Radiology CT Angio Head Neck W WO CM  Result Date: 01/29/2023 CLINICAL DATA:  Chest pain radiating to left arm with facial numbness EXAM: CT ANGIOGRAPHY HEAD AND NECK TECHNIQUE: Multidetector CT imaging of the head and neck was performed using the standard protocol during bolus  administration of intravenous contrast. Multiplanar CT image reconstructions and MIPs were obtained to evaluate the vascular anatomy. Carotid stenosis measurements (when applicable) are obtained utilizing NASCET criteria, using the distal internal carotid diameter as the denominator. RADIATION DOSE REDUCTION: This exam was performed according to the departmental dose-optimization program which includes automated exposure control, adjustment of the mA and/or kV  according to patient size and/or use of iterative reconstruction technique. CONTRAST:  64m OMNIPAQUE IOHEXOL 350 MG/ML SOLN COMPARISON:  MRA neck 01/18/2023, noncontrast CT head 08/09/2022 FINDINGS: CT HEAD FINDINGS Brain: There is no acute intracranial hemorrhage, extra-axial fluid collection, or acute infarct. Parenchymal volume is normal. The ventricles are normal in size. Gray-white differentiation is preserved. The pituitary and suprasellar region are normal. There is no mass lesion there is no mass effect or midline shift. Vascular: See below. Skull: Normal. Negative for fracture or focal lesion. Sinuses/Orbits: The paranasal sinuses are essentially clear. The globes and orbits are unremarkable. Review of the MIP images confirms the above findings CTA NECK FINDINGS Aortic arch: There is mild calcified plaque in the imaged aortic arch. The origins of the major branch vessels are patent. The subclavian arteries are patent to the level imaged. Right carotid system: The right common carotid artery is patent. There is bulky calcified plaque at the bifurcation resulting in critical stenosis of the proximal internal carotid artery with relatively diminutive caliber of the distal internal carotid artery. The external carotid artery is patent. There is no raised dissection flap or aneurysm/pseudoaneurysm. Left carotid system: The left common carotid artery is patent. There is calcified plaque in the proximal internal carotid artery resulting in up to  approximately 30% stenosis. The distal internal carotid artery is patent. The external carotid artery is patent. There is no raised dissection flap or aneurysm/pseudoaneurysm. Vertebral arteries: The vertebral arteries are patent, without hemodynamically significant stenosis or occlusion. There is no evidence of dissection or aneurysm. Skeleton: There is no acute osseous abnormality or suspicious osseous lesion. There is no visible canal hematoma. Other neck: The soft tissues of the neck are unremarkable. Upper chest: The imaged lung apices are clear. Review of the MIP images confirms the above findings CTA HEAD FINDINGS Anterior circulation: There is calcified plaque in the carotid siphons resulting in moderate to severe stenosis of the bilateral paraclinoid segments. The right M1 segment and distal branches are patent, without proximal stenosis or occlusion. The left M1 segment and distal branches are patent, without proximal stenosis or occlusion. 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 The left A1 segment is patent. The distal ACA branches are patent, without proximal stenosis or occlusion. The anterior communicating artery is normal. There is a 2-3 mm laterally directed outpouching arising from the communicating segment of the left ICA just proximal to the M1 segment suspicious for aneurysm (12-122, 13-102). Posterior circulation: There is calcified plaque in the proximal right V4 segment resulting in mild stenosis. The right V4 segment after the PICA origin is diminutive, favored developmental. The dominant left V4 segment is patent. PICA is identified bilaterally. The basilar artery is patent. The other major cerebellar arteries are patent. The bilateral PCAs are patent, without proximal stenosis or occlusion. A small right posterior communicating artery is identified There is no posterior circulation  aneurysm. Venous sinuses: Patent. Anatomic variants: As above. Review of the MIP images confirms the above findings IMPRESSION: 1. No acute intracranial pathology on noncontrast head CT. 2. Critical stenosis of the right ICA at the bifurcation with diminutive caliber of the distal ICA in the neck. 3. Approximately 30% stenosis of the proximal left ICA. Patent vertebral arteries in the neck. 4. Calcified plaque in the carotid siphons resulting in moderate to severe stenosis bilaterally. Otherwise, patent intracranial vasculature without other proximal high-grade stenosis or occlusion. 5. 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 Electronically Signed   By: Valetta Mole M.D.   On: 01/29/2023 13:42   DG Chest Port 1 View  Result Date: 01/29/2023 CLINICAL DATA:  Chest pain EXAM: PORTABLE CHEST 1 VIEW COMPARISON:  09/20/2022 FINDINGS: Artifact from EKG leads. Borderline heart size. CABG. There is no edema, consolidation, effusion, or pneumothorax. IMPRESSION: No evidence of active disease. Electronically Signed   By: Jorje Guild M.D.   On: 01/29/2023 09:54    Procedures Procedures    Medications Ordered in ED Medications  acetaminophen (TYLENOL) tablet 1,000 mg (1,000 mg Oral Patient Refused/Not Given 01/29/23 0949)  alum & mag hydroxide-simeth (MAALOX/MYLANTA) 200-200-20 MG/5ML suspension 30 mL (30 mLs Oral Given 01/29/23 0954)  famotidine (PEPCID) tablet 20 mg (20 mg Oral Given 01/29/23 0954)  lactated ringers bolus 1,000 mL (0 mLs Intravenous Stopped 01/29/23 1357)  iohexol (OMNIPAQUE) 350 MG/ML injection 75 mL (75 mLs Intravenous Contrast Given 01/29/23 1307)    ED Course/ Medical Decision Making/ A&P                             Medical Decision Making Problems Addressed: Carotid artery disease without cerebral infarction [facility]): chronic illness or injury with exacerbation, progression, or side effects of treatment that poses a threat to life or bodily  functions Essential hypertension: chronic illness or injury with exacerbation, progression, or side effects of treatment that poses a threat to life or bodily functions Precordial chest pain: acute illness or injury with systemic symptoms that poses a threat to life or bodily functions Sweaty palms: acute illness or injury  Amount and/or Complexity of Data Reviewed Independent Historian: EMS    Details: hx External Data Reviewed: labs, radiology and notes. Labs: ordered. Decision-making details documented in ED Course. Radiology: ordered and independent interpretation performed. Decision-making details documented in ED Course. ECG/medicine tests: ordered and independent interpretation performed. Decision-making details documented in ED Course. Discussion of management or test interpretation with external provider(s): Vascular, hospitalists.   Risk OTC drugs. Prescription drug management. Decision regarding hospitalization.   Iv ns. Continuous pulse ox and cardiac monitoring. Labs ordered/sent. Imaging ordered.   Differential diagnosis includes ACS, msk cp, gi cp, COD, etc. Dispo decision including potential need for admission considered - will get labs and imaging and reassess.   Reviewed nursing notes and prior charts for additional history. External reports reviewed. Additional history from: EMS.   Pt requests head/neck angio study be done today - ordered.   Cardiac monitor: sinus rhythm, rate 60.  Labs reviewed/interpreted by me - trop normal.   Xrays reviewed/interpreted by me - no pna.   CTA  reviewed/interpreted by me - no acute cva noted. Critical right carotid occlusive disease.   Vascular surgery consulted - discussed pt, recent Dr Stanford Breed note/assessment and todays CTA results - he indicates from carotid disease perspective, he will leave message with office to have Dr Stanford Breed f/u this week with patient (and indicates chest pain/other symptoms would need to be addressed  prior to scheduling for subsequent carotid surgery).  Given hx cad, new chest pain/discomfort, other symptoms, will consult hospitalist for admission.  May benefit from neurology and/or cardiology consultation during hospital stay.             Final Clinical Impression(s) / ED Diagnoses Final diagnoses:  Precordial chest pain  Sweaty palms  Essential hypertension  Carotid artery disease without cerebral infarction [facility])    Rx /  DC Orders ED Discharge Orders          Ordered    Ambulatory referral to Cardiology       Comments: If you have not heard from the Cardiology office within the next 72 hours please call [phone].   01/29/23 1218              Lajean Saver, MD 01/29/23 1433

## 2023-01-29 NOTE — Progress Notes (Signed)
Pt on 2L [place] and stable. Pt states does wear CPAP at home but refuses to wear at this time. Will continue to monitor

## 2023-01-29 NOTE — ED Triage Notes (Signed)
Pt BIB [facility] for c/o chest pain that radiates to his left arm, SHOB and some numbess on his face . Pt describes chest pain as a tightness like something is sitting on his chest. EMS placed pt on 2 L of O2 of comfort and gave aspirin PTA. Pt has hx of sleep apnea and has a clot in his carotid artery, pt has a CT scan scheduled on Wednesday to get a better look at it  BP 172/80 HR 70 RR16 99% room air CBG 120

## 2023-01-29 NOTE — ED Notes (Signed)
ED TO INPATIENT HANDOFF REPORT  ED Nurse Name and Phone #:   S Name/Age/Gender [name] 77 y.o. male Room/Bed: 034C/034C  Code Status   Code Status: Full Code  Home/SNF/Other Home Patient oriented to: self, place, time, and situation Is this baseline? Yes   Triage Complete: Triage complete  Chief Complaint Chest pain, rule out acute myocardial infarction [R07.9]  Triage Note Pt BIB [facility] for c/o chest pain that radiates to his left arm, SHOB and some numbess on his face . Pt describes chest pain as a tightness like something is sitting on his chest. EMS placed pt on 2 L of O2 of comfort and gave aspirin PTA. Pt has hx of sleep apnea and has a clot in his carotid artery, pt has a CT scan scheduled on Wednesday to get a better look at it  BP 172/80 HR 70 RR16 99% room air CBG 120   Allergies Allergies  Allergen Reactions   Codeine Palpitations   Vicodin [Hydrocodone-Acetaminophen] Nausea And Vomiting, Swelling and Palpitations    Level of Care/Admitting Diagnosis ED Disposition     ED Disposition  Admit   Condition  --   [place]: [place] [[id number]]  Level of Care: Telemetry Cardiac [103]  May place patient in observation at [facility] or [place] if equivalent level of care is available:: No  Covid Evaluation: Asymptomatic - no recent exposure (last 10 days) testing not required  Diagnosis: Chest pain, rule out acute myocardial infarction [id number]  Admitting Physician: Marcelyn Bruins [id number]  Attending Physician: Marcelyn Bruins [id number]          B Medical/Surgery History Past Medical History:  Diagnosis Date   Anginal pain ([place]) 01/2005   Anxiety    Arthritis    "knees"   Chronic lower back pain    Colon polyp    Coronary artery disease    Depression    Hepatitis    "don't know what kind; they treated me for it; was a long time ago" (12/11/2018)   High cholesterol    History of bronchitis     "used to get it alot" (08/25/2012)   Hypertension    Panic attacks    PTSD (post-traumatic stress disorder)    "have been treated in the past" (08/25/2012)   Past Surgical History:  Procedure Laterality Date   CARDIAC CATHETERIZATION  01/2005   CORONARY ARTERY BYPASS GRAFT  01/2005   CABG X3   Shrapnel Left 1969   LLE; left lateral thumb (required grafting)   VARICOSE VEIN SURGERY  1980's   LLE     A IV Location/Drains/Wounds Patient Lines/Drains/Airways Status     Active Line/Drains/Airways     Name Placement date Placement time Site Days   Peripheral IV 01/29/23 18 G Right Forearm 01/29/23  0915  Forearm  less than 1            Intake/Output Last 24 hours No intake or output data in the 24 hours ending 01/29/23 1520  Labs/Imaging Results for orders placed or performed during the hospital encounter of 01/29/23 (from the past 48 hour(s))  CBC     Status: None   Collection Time: 01/29/23  9:16 AM  Result Value Ref Range   WBC 5.6 4.0 - 10.5 K/uL   RBC 4.95 4.22 - 5.81 MIL/uL   Hemoglobin 14.8 13.0 - 17.0 g/dL   HCT 43.6 39.0 - 52.0 %   MCV 88.1 80.0 - 100.0  fL   MCH 29.9 26.0 - 34.0 pg   MCHC 33.9 30.0 - 36.0 g/dL   RDW 12.3 11.5 - 15.5 %   Platelets PLATELET CLUMPS NOTED ON SMEAR, UNABLE TO ESTIMATE 150 - 400 K/uL    Comment: Immature Platelet Fraction may be clinically indicated, consider ordering this additional test [id number] PLATELET CLUMPS NOTED ON SMEAR, UNABLE TO ESTIMATE    nRBC 0.0 0.0 - 0.2 %    Comment: Performed at [facility] [address] [place], [place] [address]  Comprehensive metabolic panel     Status: Abnormal   Collection Time: 01/29/23  9:16 AM  Result Value Ref Range   Sodium 135 135 - 145 mmol/L   Potassium 4.4 3.5 - 5.1 mmol/L   Chloride 102 98 - 111 mmol/L   CO2 24 22 - 32 mmol/L   Glucose, Bld 117 (H) 70 - 99 mg/dL    Comment: Glucose reference range applies only to samples taken after fasting for at least 8 hours.    BUN 26 (H) 8 - 23 mg/dL   Creatinine, Ser 1.34 (H) 0.61 - 1.24 mg/dL   Calcium 9.5 8.9 - 10.3 mg/dL   Total Protein 8.9 (H) 6.5 - 8.1 g/dL   Albumin 4.3 3.5 - 5.0 g/dL   AST 30 15 - 41 U/L   ALT 34 0 - 44 U/L   Alkaline Phosphatase 49 38 - 126 U/L   Total Bilirubin 0.8 0.3 - 1.2 mg/dL   GFR, Estimated 55 (L) >60 mL/min    Comment: (NOTE) Calculated using the CKD-EPI Creatinine Equation (2021)    Anion gap 9 5 - 15    Comment: Performed at [facility] [address], [place], [place] [address]  Troponin I (High Sensitivity)     Status: None   Collection Time: 01/29/23  9:16 AM  Result Value Ref Range   Troponin I (High Sensitivity) 6 <18 ng/L    Comment: (NOTE) Elevated high sensitivity troponin I (hsTnI) values and significant  changes across serial measurements may suggest ACS but many other  chronic and acute conditions are known to elevate hsTnI results.  Refer to the "Links" section for chest pain algorithms and additional  guidance. Performed at [facility], [place] [address] [place], [place] [address]   Troponin I (High Sensitivity)     Status: None   Collection Time: 01/29/23 11:30 AM  Result Value Ref Range   Troponin I (High Sensitivity) 6 <18 ng/L    Comment: (NOTE) Elevated high sensitivity troponin I (hsTnI) values and significant  changes across serial measurements may suggest ACS but many other  chronic and acute conditions are known to elevate hsTnI results.  Refer to the "Links" section for chest pain algorithms and additional  guidance. Performed at [facility], [place] [address] [place], [place] [address]    CT Angio Head Neck W WO CM  Result Date: 01/29/2023 CLINICAL DATA:  Chest pain radiating to left arm with facial numbness EXAM: CT ANGIOGRAPHY HEAD AND NECK TECHNIQUE: Multidetector CT imaging of the head and neck was performed using the standard protocol during bolus administration of intravenous contrast. Multiplanar CT image  reconstructions and MIPs were obtained to evaluate the vascular anatomy. Carotid stenosis measurements (when applicable) are obtained utilizing NASCET criteria, using the distal internal carotid diameter as the denominator. RADIATION DOSE REDUCTION: This exam was performed according to the departmental dose-optimization program which includes automated exposure control, adjustment of the  mA and/or kV according to patient size and/or use of iterative reconstruction technique. CONTRAST:  33m OMNIPAQUE IOHEXOL 350 MG/ML SOLN COMPARISON:  MRA neck 01/18/2023, noncontrast CT head 08/09/2022 FINDINGS: CT HEAD FINDINGS Brain: There is no acute intracranial hemorrhage, extra-axial fluid collection, or acute infarct. Parenchymal volume is normal. The ventricles are normal in size. Gray-white differentiation is preserved. The pituitary and suprasellar region are normal. There is no mass lesion there is no mass effect or midline shift. Vascular: See below. Skull: Normal. Negative for fracture or focal lesion. Sinuses/Orbits: The paranasal sinuses are essentially clear. The globes and orbits are unremarkable. Review of the MIP images confirms the above findings CTA NECK FINDINGS Aortic arch: There is mild calcified plaque in the imaged aortic arch. The origins of the major branch vessels are patent. The subclavian arteries are patent to the level imaged. Right carotid system: The right common carotid artery is patent. There is bulky calcified plaque at the bifurcation resulting in critical stenosis of the proximal internal carotid artery with relatively diminutive caliber of the distal internal carotid artery. The external carotid artery is patent. There is no raised dissection flap or aneurysm/pseudoaneurysm. Left carotid system: The left common carotid artery is patent. There is calcified plaque in the proximal internal carotid artery resulting in up to approximately 30% stenosis. The distal internal carotid artery is  patent. The external carotid artery is patent. There is no raised dissection flap or aneurysm/pseudoaneurysm. Vertebral arteries: The vertebral arteries are patent, without hemodynamically significant stenosis or occlusion. There is no evidence of dissection or aneurysm. Skeleton: There is no acute osseous abnormality or suspicious osseous lesion. There is no visible canal hematoma. Other neck: The soft tissues of the neck are unremarkable. Upper chest: The imaged lung apices are clear. Review of the MIP images confirms the above findings CTA HEAD FINDINGS Anterior circulation: There is calcified plaque in the carotid siphons resulting in moderate to severe stenosis of the bilateral paraclinoid segments. The right M1 segment and distal branches are patent, without proximal stenosis or occlusion. The left M1 segment and distal branches are patent, without proximal stenosis or occlusion. 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 The left A1 segment is patent. The distal ACA branches are patent, without proximal stenosis or occlusion. The anterior communicating artery is normal. There is a 2-3 mm laterally directed outpouching arising from the communicating segment of the left ICA just proximal to the M1 segment suspicious for aneurysm (12-122, 13-102). Posterior circulation: There is calcified plaque in the proximal right V4 segment resulting in mild stenosis. The right V4 segment after the PICA origin is diminutive, favored developmental. The dominant left V4 segment is patent. PICA is identified bilaterally. The basilar artery is patent. The other major cerebellar arteries are patent. The bilateral PCAs are patent, without proximal stenosis or occlusion. A small right posterior communicating artery is identified There is no posterior circulation aneurysm. Venous sinuses: Patent. Anatomic variants: As above.  Review of the MIP images confirms the above findings IMPRESSION: 1. No acute intracranial pathology on noncontrast head CT. 2. Critical stenosis of the right ICA at the bifurcation with diminutive caliber of the distal ICA in the neck. 3. Approximately 30% stenosis of the proximal left ICA. Patent vertebral arteries in the neck. 4. Calcified plaque in the carotid siphons resulting in moderate to severe stenosis bilaterally. Otherwise, patent intracranial vasculature without other proximal high-grade stenosis or occlusion. 5. Suspect 2-3  mm aneurysm arising from the communicating segment of the left ICA. Electronically Signed   By: Valetta Mole M.D.   On: 01/29/2023 13:42   DG Chest Port 1 View  Result Date: 01/29/2023 CLINICAL DATA:  Chest pain EXAM: PORTABLE CHEST 1 VIEW COMPARISON:  09/20/2022 FINDINGS: Artifact from EKG leads. Borderline heart size. CABG. There is no edema, consolidation, effusion, or pneumothorax. IMPRESSION: No evidence of active disease. Electronically Signed   By: Jorje Guild M.D.   On: 01/29/2023 09:54    Pending Labs Unresulted Labs (From admission, onward)     Start     Ordered   02/05/23 0500  Creatinine, serum  (enoxaparin (LOVENOX)    CrCl >/= 30 ml/min)  Weekly,   R     Comments: while on enoxaparin therapy    01/29/23 1517   01/30/23 [id number]  Basic metabolic panel  Tomorrow morning,   R        01/29/23 1517            Vitals/Pain Today's Vitals   01/29/23 1345 01/29/23 1400 01/29/23 1415 01/29/23 1430  BP: (!) 135/58 (!) 152/61 (!) 156/64 (!) 141/62  Pulse: (!) 51 (!) 48 (!) 45 (!) 32  Resp:    18  Temp:      TempSrc:      SpO2: 100% 100% 100% 96%  Weight:      Height:      PainSc:        Isolation Precautions No active isolations  Medications Medications  acetaminophen (TYLENOL) tablet 1,000 mg (1,000 mg Oral Patient Refused/Not Given 01/29/23 0949)  aspirin EC tablet 81 mg (has no administration in time range)  indapamide (LOZOL) tablet 1.25  mg (has no administration in time range)  irbesartan (AVAPRO) tablet 300 mg (has no administration in time range)  acetaminophen (TYLENOL) tablet 650 mg (has no administration in time range)  ondansetron (ZOFRAN) injection 4 mg (has no administration in time range)  enoxaparin (LOVENOX) injection 40 mg (has no administration in time range)  alum & mag hydroxide-simeth (MAALOX/MYLANTA) 200-200-20 MG/5ML suspension 30 mL (30 mLs Oral Given 01/29/23 0954)  famotidine (PEPCID) tablet 20 mg (20 mg Oral Given 01/29/23 0954)  lactated ringers bolus 1,000 mL (0 mLs Intravenous Stopped 01/29/23 1357)  iohexol (OMNIPAQUE) 350 MG/ML injection 75 mL (75 mLs Intravenous Contrast Given 01/29/23 1307)    Mobility walks     Focused Assessments Cardiac Assessment Handoff:  Cardiac Rhythm: Sinus bradycardia Lab Results  Component Value Date   CKTOTAL 125 08/26/2012   CKMB 1.7 08/26/2012   TROPONINI <0.03 03/08/2019   Lab Results  Component Value Date   DDIMER 1.98 (H) 01/11/2014   Does the Patient currently have chest pain? No    R Recommendations: See Admitting Provider Note  Report given to:   Additional Notes:

## 2023-01-30 DIAGNOSIS — I25700 Atherosclerosis of coronary artery bypass graft(s), unspecified, with unstable angina pectoris: Secondary | ICD-10-CM | POA: Diagnosis not present

## 2023-01-30 DIAGNOSIS — K5900 Constipation, unspecified: Secondary | ICD-10-CM | POA: Diagnosis not present

## 2023-01-30 DIAGNOSIS — I5032 Chronic diastolic (congestive) heart failure: Secondary | ICD-10-CM | POA: Diagnosis not present

## 2023-01-30 DIAGNOSIS — Z7982 Long term (current) use of aspirin: Secondary | ICD-10-CM | POA: Diagnosis not present

## 2023-01-30 DIAGNOSIS — R079 Chest pain, unspecified: Secondary | ICD-10-CM | POA: Diagnosis not present

## 2023-01-30 DIAGNOSIS — N1831 Chronic kidney disease, stage 3a: Secondary | ICD-10-CM | POA: Diagnosis present

## 2023-01-30 DIAGNOSIS — R072 Precordial pain: Secondary | ICD-10-CM | POA: Diagnosis not present

## 2023-01-30 DIAGNOSIS — I6521 Occlusion and stenosis of right carotid artery: Secondary | ICD-10-CM

## 2023-01-30 DIAGNOSIS — Z951 Presence of aortocoronary bypass graft: Secondary | ICD-10-CM | POA: Diagnosis not present

## 2023-01-30 DIAGNOSIS — I6529 Occlusion and stenosis of unspecified carotid artery: Secondary | ICD-10-CM | POA: Diagnosis not present

## 2023-01-30 DIAGNOSIS — Z87891 Personal history of nicotine dependence: Secondary | ICD-10-CM | POA: Diagnosis not present

## 2023-01-30 DIAGNOSIS — I2510 Atherosclerotic heart disease of native coronary artery without angina pectoris: Secondary | ICD-10-CM | POA: Diagnosis not present

## 2023-01-30 DIAGNOSIS — Z79899 Other long term (current) drug therapy: Secondary | ICD-10-CM | POA: Diagnosis not present

## 2023-01-30 DIAGNOSIS — I130 Hypertensive heart and chronic kidney disease with heart failure and stage 1 through stage 4 chronic kidney disease, or unspecified chronic kidney disease: Secondary | ICD-10-CM | POA: Diagnosis not present

## 2023-01-30 LAB — BASIC METABOLIC PANEL
Anion gap: 10 (ref 5–15)
BUN: 24 mg/dL — ABNORMAL HIGH (ref 8–23)
CO2: 21 mmol/L — ABNORMAL LOW (ref 22–32)
Calcium: 9 mg/dL (ref 8.9–10.3)
Chloride: 106 mmol/L (ref 98–111)
Creatinine, Ser: 1.3 mg/dL — ABNORMAL HIGH (ref 0.61–1.24)
GFR, Estimated: 57 mL/min — ABNORMAL LOW (ref >60)
Glucose, Bld: 103 mg/dL — ABNORMAL HIGH (ref 70–99)
Potassium: 4.4 mmol/L (ref 3.5–5.1)
Sodium: 137 mmol/L (ref 135–145)

## 2023-01-30 LAB — TROPONIN I (HIGH SENSITIVITY): Troponin I (High Sensitivity): 6 ng/L (ref <18)

## 2023-01-30 MED ORDER — IRBESARTAN 150 MG PO TABS
150.0000 mg | ORAL_TABLET | Freq: Every day | ORAL | Status: DC
  Administered 2023-01-30 – 2023-01-31 (×2): 150 mg via ORAL
  Filled 2023-01-30: qty 1

## 2023-01-30 MED ORDER — POLYETHYLENE GLYCOL 3350 17 G PO PACK
17.0000 g | PACK | Freq: Every day | ORAL | Status: DC
  Administered 2023-01-30: 17 g via ORAL
  Filled 2023-01-30: qty 1

## 2023-01-30 NOTE — Assessment & Plan Note (Signed)
-  Continue nightly CPAP °

## 2023-01-30 NOTE — Progress Notes (Signed)
RN notified by CMT that patient was having intermittent inverted P waves, notified Sande Rives, PA she states to get an EKG.

## 2023-01-30 NOTE — Assessment & Plan Note (Signed)
Currently euvolemic. 

## 2023-01-30 NOTE — Assessment & Plan Note (Signed)
Labs done 2 months ago and triglycerides of 174 and LDL of 47.  Given vascular surgery recommendations and at this plan, no intervention, statin added.

## 2023-01-30 NOTE — Assessment & Plan Note (Addendum)
For stress test in the morning.  Cardiology following.  Currently chest pain-free.  Continue aspirin

## 2023-01-30 NOTE — Hospital Course (Signed)
77 year old male with past medical history of diastolic CHF, obesity, CAD status post CABG, alcohol use with secondary cirrhosis, hepatitis C, CKD 3 AA and obstructive sleep apnea who had been having issues with intermittent facial numbness and had had an outpatient carotid Doppler noting critical stenosis of the right internal carotid artery versus short segment occlusion, which was then confirmed with MR angiogram.  Patient referred to vascular surgery who saw patient on 2/28 and recommended CT angiogram.  Patient presented to the emergency room on 3/2 with complaints of chest pain.  Initial EKG noted no signs of ischemia and troponin is negative.  Cardiology consulted given patient's previous cardiac history and suspicion that he likely will need vascular surgery intervention for his right ICA stenosis, patient being taken for Myoview stress test on 3/4.  CT angiogram done 3/2 did indeed confirm critical artery stenosis.

## 2023-01-30 NOTE — Progress Notes (Signed)
[facility] Progress Note  Patient: [name]    [id number]  DOA: 01/29/2023    Date of Service: the patient was seen and examined on 01/30/2023  Brief hospital course: 77 year old male with past medical history of diastolic CHF, obesity, CAD status post CABG, alcohol use with secondary cirrhosis, hepatitis C, CKD 3 AA and obstructive sleep apnea who had been having issues with intermittent facial numbness and had had an outpatient carotid Doppler noting critical stenosis of the right internal carotid artery versus short segment occlusion, which was then confirmed with MR angiogram.  Patient referred to vascular surgery who saw patient on 2/28 and recommended CT angiogram.  Patient presented to the emergency room on 3/2 with complaints of chest pain.  Initial EKG noted no signs of ischemia and troponin is negative.  Cardiology consulted given patient's previous cardiac history and suspicion that he likely will need vascular surgery intervention for his right ICA stenosis, patient being taken for Myoview stress test on 3/4.  CT angiogram done 3/2 did indeed confirm critical artery stenosis.   Assessment and Plan: Principal Problem:   Chest pain, rule out acute myocardial infarction Active Problems:   Coronary artery disease involving coronary bypass graft of native heart without angina pectoris   Carotid artery disease (HCC)   Alcoholic cirrhosis of liver without ascites (HCC)   Essential hypertension   Chronic diastolic heart failure (HCC)   Chronic kidney disease, stage 3a (HCC)   OSA on CPAP   Dyslipidemia   Chronic alcohol abuse   PTSD (post-traumatic stress disorder)   Chronic hepatitis C without hepatic coma (HCC)  Cardiovascular and Mediastinum Chronic diastolic heart failure (HCC) Assessment & Plan Currently euvolemic  Essential hypertension Assessment & Plan Continue home medications  Carotid artery disease ([place]) Assessment & Plan Vascular surgery following.   CTA does indeed confirm critical right ICA stenosis, however artery highly calcified making stent placement difficult.  Vascular surgery to follow-up  Respiratory OSA on CPAP Assessment & Plan Continue nightly CPAP  Digestive Alcoholic cirrhosis of liver without ascites ([place]) Assessment & Plan Labs are stable  Genitourinary Chronic kidney disease, stage 3a ([place]) Assessment & Plan At baseline  Other Chronic alcohol abuse Assessment & Plan Patient states that he no longer drinks  Dyslipidemia Assessment & Plan Labs done 2 months ago and triglycerides of 174 and LDL of 47  * Chest pain, rule out acute myocardial infarction Assessment & Plan For stress test in the morning.  Cardiology following.  Currently chest pain-free.  Continue aspirin       Body mass index is 34.59 kg/m.        Consultants: Vascular surgery Cardiology  Procedures: Plan stress test 3/4  Antimicrobials: None  Code Status: Full code   Subjective: Patient feels slightly anxious, denies any chest pain or shortness of breath currently  Objective: Mild bradycardia mildly elevated blood pressures Vitals:   01/30/23 0802 01/30/23 1226  BP: 132/70 (!) 164/69  Pulse: (!) 55 (!) 55  Resp: 19 18  Temp: 97.7 F (36.5 C) 98.2 F (36.8 C)  SpO2: 98% 100%    Intake/Output Summary (Last 24 hours) at 01/30/2023 1516 Last data filed at 01/30/2023 0900 Gross per 24 hour  Intake 480 ml  Output --  Net 480 ml   Filed Weights   01/29/23 0855 01/29/23 1900  Weight: 110.7 kg 109.4 kg   Body mass index is 34.59 kg/m.  Exam:  General: Alert and oriented x 3, no acute distress HEENT:  Normocephalic, atraumatic, mucous membranes are moist Cardiovascular: Regular rate and rhythm, S1-S2, borderline bradycardia Respiratory: Clear to auscultation bilaterally Abdomen: Soft, obese, nontender, positive bowel sounds Musculoskeletal: No clubbing or cyanosis or edema Skin: No skin breaks, tears or  lesions Psychiatry: Appropriate, no evidence of psychoses Neurology: No focal deficits  Data Reviewed: Creatinine 1.3 with GFR 57  Disposition:  Status is: Observation     Anticipated discharge date: 3/4 or 3/5  Remaining issues to be resolved so that patient can be discharged:  -Stress test -Follow-up by vascular surgery   Family Communication: Will call family DVT Prophylaxis: Lovenox    Author: Annita Brod ,MD 01/30/2023 3:16 PM  To reach On-call, see care teams to locate the attending and reach out via [email or web address]. Between 7PM-7AM, please contact night-coverage If you still have difficulty reaching the attending provider, please page the [facility] (Director on Call) for [facility] on amion for assistance.

## 2023-01-30 NOTE — Assessment & Plan Note (Signed)
Continue home medications. 

## 2023-01-30 NOTE — Consult Note (Addendum)
Hospital Consult    Reason for Consult: Carotid artery stenosis Requesting Physician: Hospitalist team MRN #:  [id number]  History of Present Illness: This is a 77 y.o. male admitted to the hospital for chest pain.  Past medical history is negative for CAD with CABG and redo CABG, hypertension, and hyperlipidemia.  He was seen in our office last week and evaluated for critical proximal right ICA stenosis.  Plan was to obtain CTA neck and follow-up with plans pending results.  CTA was performed yesterday and demonstrated critical stenosis of the proximal right ICA.  He has been experiencing vague symptoms including facial numbness and weakness as well as occasional right and left arm pain and weakness.  He denies any current vision changes, left-sided weakness, or slurring speech.  He is scheduled for nuclear stress test tomorrow.  He is on aspirin daily.  He is on Repatha for hyperlipidemia.  He is a former smoker.  He is adamant about undergoing revascularization of the right ICA during his current hospitalization.  Past Medical History:  Diagnosis Date   Anginal pain ([place]) 01/2005   Anxiety    Arthritis    "knees"   Chronic lower back pain    Colon polyp    Coronary artery disease    Depression    Hepatitis    "don't know what kind; they treated me for it; was a long time ago" (12/11/2018)   High cholesterol    History of bronchitis    "used to get it alot" (08/25/2012)   Hypertension    Panic attacks    PTSD (post-traumatic stress disorder)    "have been treated in the past" (08/25/2012)    Past Surgical History:  Procedure Laterality Date   CARDIAC CATHETERIZATION  01/2005   CORONARY ARTERY BYPASS GRAFT  01/2005   CABG X3   Shrapnel Left 1969   LLE; left lateral thumb (required grafting)   VARICOSE VEIN SURGERY  1980's   LLE    Allergies  Allergen Reactions   Codeine Palpitations   Vicodin [Hydrocodone-Acetaminophen] Nausea And Vomiting, Swelling and Palpitations     Prior to Admission medications   Medication Sig Start Date End Date Taking? Authorizing Provider  aspirin EC 81 MG tablet Take 1 tablet (81 mg total) by mouth daily. Swallow whole. 01/03/23  Yes Freada Bergeron, MD  escitalopram (LEXAPRO) 5 MG tablet Take 5 mg by mouth as needed (depression/anxiety/PTSD). 11/09/21  Yes [provider]  irbesartan (AVAPRO) 300 MG tablet TAKE 1 TABLET BY MOUTH EVERY DAY Patient taking differently: Take 150 mg by mouth daily. 08/30/22  Yes Belva Crome, MD  REPATHA SURECLICK [id number] MG/ML SOAJ INJECT 140 MG INTO THE SKIN EVERY 14 (FOURTEEN) DAYS. 03/29/22  Yes Belva Crome, MD  sildenafil (VIAGRA) 100 MG tablet Take 100 mg by mouth daily as needed for erectile dysfunction. 06/17/21  Yes [provider]  indapamide (LOZOL) 1.25 MG tablet Take 1 tablet (1.25 mg total) by mouth daily. Patient not taking: Reported on 01/30/2023 12/07/21   Belva Crome, MD  Sofosbuvir-Velpatasvir (EPCLUSA) 400-100 MG TABS Take 1 tablet by mouth daily. Patient not taking: Reported on 09/08/2022 02/04/22   Esmond Plants, RPH-CPP  spironolactone (ALDACTONE) 25 MG tablet Take 1 tablet (25 mg total) by mouth daily. Patient not taking: Reported on 01/24/2023 12/03/21   Belva Crome, MD    Social History   Socioeconomic History   Marital status: Divorced    Spouse name: Not on file  Number of children: Not on file   Years of education: Not on file   Highest education level: Not on file  Occupational History   Not on file  Tobacco Use   Smoking status: Former    Packs/day: 2.00    Years: 5.00    Total pack years: 10.00    Types: Cigarettes   Smokeless tobacco: Never   Tobacco comments:    08/25/2012 "quit smoking cigarettes 40 yr ago"  Vaping Use   Vaping Use: Never used  Substance and Sexual Activity   Alcohol use: Not Currently    Alcohol/week: 60.0 standard drinks of alcohol    Types: 60 Standard drinks or equivalent per week    Comment: 12/11/2018 "~ 1  pint of vodka/day"   Drug use: Not Currently    Types: Marijuana   Sexual activity: Yes  Other Topics Concern   Not on file  Social History Narrative   Not on file   Social Determinants of Health   Financial Resource Strain: Low Risk  (07/14/2022)   Overall Financial Resource Strain (CARDIA)    Difficulty of Paying Living Expenses: Not hard at all  Food Insecurity: No Food Insecurity (07/14/2022)   Hunger Vital Sign    Worried About Running Out of Food in the Last Year: Never true    Ran Out of Food in the Last Year: Never true  Transportation Needs: No Transportation Needs (07/14/2022)   PRAPARE - Hydrologist (Medical): No    Lack of Transportation (Non-Medical): No  Physical Activity: Sufficiently Active (07/14/2022)   Exercise Vital Sign    Days of Exercise per Week: 6 days    Minutes of Exercise per Session: 60 min  Stress: No Stress Concern Present (07/14/2022)   [place]    Feeling of Stress : Not at all  Social Connections: Moderately Integrated (07/14/2022)   Social Connection and Isolation Panel [NHANES]    Frequency of Communication with Friends and Family: More than three times a week    Frequency of Social Gatherings with Friends and Family: More than three times a week    Attends Religious Services: More than 4 times per year    Active Member of [employer] or Organizations: Yes    Attends Music therapist: More than 4 times per year    Marital Status: Divorced  Intimate Partner Violence: Not At Risk (07/14/2022)   Humiliation, Afraid, Rape, and Kick questionnaire    Fear of Current or Ex-Partner: No    Emotionally Abused: No    Physically Abused: No    Sexually Abused: No     Family History  Problem Relation Age of Onset   Diabetes Mother    Heart disease Father    Sleep apnea Sister    Cancer Brother    Colon cancer Neg Hx    Liver cancer Neg Hx     Pancreatic cancer Neg Hx    Esophageal cancer Neg Hx    Stomach cancer Neg Hx     ROS: Otherwise negative unless mentioned in HPI  Physical Examination  Vitals:   01/30/23 0052 01/30/23 0802  BP: 134/63 132/70  Pulse: 60 (!) 55  Resp: 18 19  Temp: 98 F (36.7 C) 97.7 F (36.5 C)  SpO2: 100% 98%   Body mass index is 34.59 kg/m.  General:  WDWN in NAD Gait: Not observed HENT: WNL, normocephalic Pulmonary: normal non-labored breathing, without  Rales, rhonchi,  wheezing Cardiac: regular Abdomen:  soft, NT/ND, no masses Skin: without rashes Vascular Exam/Pulses: R DP 2+; L DP absent Extremities: without ischemic changes, without Gangrene , without cellulitis; without open wounds; varicose veins of lower extremities Musculoskeletal: no muscle wasting or atrophy  Neurologic: A&O X 3;  CN grossly intact Psychiatric:  The pt has Normal affect. Lymph:  Unremarkable  CBC    Component Value Date/Time   WBC 5.6 01/29/2023 0916   RBC 4.95 01/29/2023 0916   HGB 14.8 01/29/2023 0916   HGB CANCELED 03/21/2019 1659   HCT 43.6 01/29/2023 0916   HCT CANCELED 03/21/2019 1659   PLT PLATELET CLUMPS NOTED ON SMEAR, UNABLE TO ESTIMATE 01/29/2023 0916   PLT CANCELED 03/21/2019 1659   MCV 88.1 01/29/2023 0916   MCV 89 01/24/2019 1522   MCH 29.9 01/29/2023 0916   MCHC 33.9 01/29/2023 0916   RDW 12.3 01/29/2023 0916   RDW 12.7 01/24/2019 1522   LYMPHSABS 2.4 08/12/2022 1420   LYMPHSABS CANCELED 03/21/2019 1659   MONOABS 0.4 08/12/2022 1420   EOSABS 0.0 08/12/2022 1420   EOSABS CANCELED 03/21/2019 1659   BASOSABS 0.1 08/12/2022 1420   BASOSABS CANCELED 03/21/2019 1659    BMET    Component Value Date/Time   NA 137 01/30/2023 0603   NA 136 12/03/2021 1407   K 4.4 01/30/2023 0603   CL 106 01/30/2023 0603   CO2 21 (L) 01/30/2023 0603   GLUCOSE 103 (H) 01/30/2023 0603   BUN 24 (H) 01/30/2023 0603   BUN 23 12/03/2021 1407   CREATININE 1.30 (H) 01/30/2023 0603   CREATININE 1.51  (H) 03/18/2022 1131   CALCIUM 9.0 01/30/2023 0603   GFRNONAA 57 (L) 01/30/2023 0603   GFRAA 83 01/06/2021 1401    COAGS: Lab Results  Component Value Date   INR 1.1 (H) 12/30/2021   INR 1.3 (H) 02/24/2019   INR 1.08 08/25/2012     Non-Invasive Vascular Imaging:   CTA demonstrating critical proximal right ICA stenosis.  Left ICA stenosis is of no hemodynamic significance   ASSESSMENT/PLAN: This is a 77 y.o. male being seen in consultation for evaluation of carotid artery stenosis  -Mr. Even is a 77 year old male who had been experiencing vague neurological symptoms including facial droop, weakness, and occasional bilateral upper extremity weakness and pain.  He was evaluated in our office last week and plan was to obtain CTA neck.  This was performed yesterday while being admitted for chest pain.  CTA demonstrates critical stenosis of the right ICA.  This appears to be above the asymptomatic threshold for revascularization.  Plans noted for nuclear stress test tomorrow.  Treatment plan for critical right ICA stenosis will be determined pending nuclear stress test results and recommendations from cardiology.  Patient is adamant about having carotid surgery during his current admission.  On-call vascular surgeon Dr. Carlis Abbott will evaluate the patient and provide further treatment plans.   Dagoberto Ligas PA-C [facility] [phone]   I have seen and evaluated the patient. I agree with the PA note as documented above.  77 year old male recently evaluated in the office by Dr. Stanford Breed for what was felt to be asymptomatic carotid disease.  Seen in the ED yesterday with chest pain and also got his CTA neck that Dr. Stanford Breed had wanted done as an outpatient.  He has a heavily calcified critical right ICA stenosis that appears high-grade.  I doubt he is a candidate for carotid stent given the heavy calcification.  He denies any new neurologic events.  He has been evaluate by  cardiology with plans for nuclear perfusion study tomorrow.  Troponins normal.  Discussed that pending the results of the study we can decide on surgical intervention for his carotid disease.  I will let Dr. Stanford Breed know of his admission.  Marty Heck, MD [facility] [place] Office: [phone]

## 2023-01-30 NOTE — Assessment & Plan Note (Signed)
Labs are stable.

## 2023-01-30 NOTE — Progress Notes (Signed)
Rounding Note    Patient Name: [name] Date of Encounter: 01/30/2023  [place] Cardiologist: Sinclair Grooms, MD (Inactive) to [facility]  Subjective   No angina/dyspnea. Mild bradycardia (occasionally has junctional rhythm or low atrial ectopic atrial bradycardia on monitor).  Inpatient Medications    Scheduled Meds:  acetaminophen  1,000 mg Oral Once   aspirin EC  81 mg Oral Daily   enoxaparin (LOVENOX) injection  0.5 mg/kg Subcutaneous Q24H   escitalopram  5 mg Oral Daily   indapamide  1.25 mg Oral Daily   irbesartan  150 mg Oral Daily   Continuous Infusions:  PRN Meds: acetaminophen, melatonin, ondansetron (ZOFRAN) IV   Vital Signs    Vitals:   01/29/23 1900 01/29/23 2021 01/30/23 0052 01/30/23 0802  BP:   134/63 132/70  Pulse:   60 (!) 55  Resp:   18 19  Temp:   98 F (36.7 C) 97.7 F (36.5 C)  TempSrc:   Oral Oral  SpO2:  100% 100% 98%  Weight: 109.4 kg     Height: '5\' 10"'$  (1.778 m)       Intake/Output Summary (Last 24 hours) at 01/30/2023 1158 Last data filed at 01/30/2023 0900 Gross per 24 hour  Intake 480 ml  Output --  Net 480 ml      01/29/2023    7:00 PM 01/29/2023    8:55 AM 01/24/2023   10:22 AM  Last 3 Weights  Weight (lbs) 241 lb 1.6 oz 244 lb 243 lb  Weight (kg) 109.362 kg 110.678 kg 110.224 kg      Telemetry    Sinus rhythm/sinus brady/ectopic atrial brady - Personally Reviewed    ECG    Sinus brady 1st deg AVB - Personally Reviewed  Physical Exam  Comfortable GEN: No acute distress.   Neck: No JVD Cardiac: RRR, no murmurs, rubs, or gallops.  Respiratory: Clear to auscultation bilaterally. GI: Soft, nontender, non-distended  MS: No edema; No deformity. Neuro:  Nonfocal  Psych: Normal affect   Labs    High Sensitivity Troponin:   Recent Labs  Lab 01/29/23 0916 01/29/23 1130 01/30/23 0603  TROPONINIHS '6 6 6     '$ Chemistry Recent Labs  Lab 01/29/23 0916 01/30/23 0603  NA 135 137  K 4.4 4.4   CL 102 106  CO2 24 21*  GLUCOSE 117* 103*  BUN 26* 24*  CREATININE 1.34* 1.30*  CALCIUM 9.5 9.0  PROT 8.9*  --   ALBUMIN 4.3  --   AST 30  --   ALT 34  --   ALKPHOS 49  --   BILITOT 0.8  --   GFRNONAA 55* 57*  ANIONGAP 9 10    Lipids No results for input(s): "CHOL", "TRIG", "HDL", "LABVLDL", "LDLCALC", "CHOLHDL" in the last 168 hours.  Hematology Recent Labs  Lab 01/29/23 0916  WBC 5.6  RBC 4.95  HGB 14.8  HCT 43.6  MCV 88.1  MCH 29.9  MCHC 33.9  RDW 12.3  PLT PLATELET CLUMPS NOTED ON SMEAR, UNABLE TO ESTIMATE   Thyroid No results for input(s): "TSH", "FREET4" in the last 168 hours.  BNPNo results for input(s): "BNP", "PROBNP" in the last 168 hours.  DDimer No results for input(s): "DDIMER" in the last 168 hours.   Radiology    CT Angio Head Neck W WO CM  Result Date: 01/29/2023 CLINICAL DATA:  Chest pain radiating to left arm with facial numbness EXAM: CT ANGIOGRAPHY HEAD AND NECK TECHNIQUE:  Multidetector CT imaging of the head and neck was performed using the standard protocol during bolus administration of intravenous contrast. Multiplanar CT image reconstructions and MIPs were obtained to evaluate the vascular anatomy. Carotid stenosis measurements (when applicable) are obtained utilizing NASCET criteria, using the distal internal carotid diameter as the denominator. RADIATION DOSE REDUCTION: This exam was performed according to the departmental dose-optimization program which includes automated exposure control, adjustment of the mA and/or kV according to patient size and/or use of iterative reconstruction technique. CONTRAST:  61m OMNIPAQUE IOHEXOL 350 MG/ML SOLN COMPARISON:  MRA neck 01/18/2023, noncontrast CT head 08/09/2022 FINDINGS: CT HEAD FINDINGS Brain: There is no acute intracranial hemorrhage, extra-axial fluid collection, or acute infarct. Parenchymal volume is normal. The ventricles are normal in size. Gray-white differentiation is preserved. The pituitary and  suprasellar region are normal. There is no mass lesion there is no mass effect or midline shift. Vascular: See below. Skull: Normal. Negative for fracture or focal lesion. Sinuses/Orbits: The paranasal sinuses are essentially clear. The globes and orbits are unremarkable. Review of the MIP images confirms the above findings CTA NECK FINDINGS Aortic arch: There is mild calcified plaque in the imaged aortic arch. The origins of the major branch vessels are patent. The subclavian arteries are patent to the level imaged. Right carotid system: The right common carotid artery is patent. There is bulky calcified plaque at the bifurcation resulting in critical stenosis of the proximal internal carotid artery with relatively diminutive caliber of the distal internal carotid artery. The external carotid artery is patent. There is no raised dissection flap or aneurysm/pseudoaneurysm. Left carotid system: The left common carotid artery is patent. There is calcified plaque in the proximal internal carotid artery resulting in up to approximately 30% stenosis. The distal internal carotid artery is patent. The external carotid artery is patent. There is no raised dissection flap or aneurysm/pseudoaneurysm. Vertebral arteries: The vertebral arteries are patent, without hemodynamically significant stenosis or occlusion. There is no evidence of dissection or aneurysm. Skeleton: There is no acute osseous abnormality or suspicious osseous lesion. There is no visible canal hematoma. Other neck: The soft tissues of the neck are unremarkable. Upper chest: The imaged lung apices are clear. Review of the MIP images confirms the above findings CTA HEAD FINDINGS Anterior circulation: There is calcified plaque in the carotid siphons resulting in moderate to severe stenosis of the bilateral paraclinoid segments. The right M1 segment and distal branches are patent, without proximal stenosis or occlusion. The left M1 segment and distal branches  are patent, without proximal stenosis or occlusion. 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 The left A1 segment is patent. The distal ACA branches are patent, without proximal stenosis or occlusion. The anterior communicating artery is normal. There is a 2-3 mm laterally directed outpouching arising from the communicating segment of the left ICA just proximal to the M1 segment suspicious for aneurysm (12-122, 13-102). Posterior circulation: There is calcified plaque in the proximal right V4 segment resulting in mild stenosis. The right V4 segment after the PICA origin is diminutive, favored developmental. The dominant left V4 segment is patent. PICA is identified bilaterally. The basilar artery is patent. The other major cerebellar arteries are patent. The bilateral PCAs are patent, without proximal stenosis or occlusion. A small right posterior communicating artery is identified There is no posterior circulation aneurysm. Venous sinuses: Patent. Anatomic variants: As above. Review of the MIP images confirms the above findings  IMPRESSION: 1. No acute intracranial pathology on noncontrast head CT. 2. Critical stenosis of the right ICA at the bifurcation with diminutive caliber of the distal ICA in the neck. 3. Approximately 30% stenosis of the proximal left ICA. Patent vertebral arteries in the neck. 4. Calcified plaque in the carotid siphons resulting in moderate to severe stenosis bilaterally. Otherwise, patent intracranial vasculature without other proximal high-grade stenosis or occlusion. 5. Suspect 2-3 mm aneurysm arising from the communicating segment of the left ICA. Electronically Signed   By: Valetta Mole M.D.   On: 01/29/2023 13:42   DG Chest Port 1 View  Result Date: 01/29/2023 CLINICAL DATA:  Chest pain EXAM: PORTABLE CHEST 1 VIEW COMPARISON:  09/20/2022 FINDINGS: Artifact from EKG leads.  Borderline heart size. CABG. There is no edema, consolidation, effusion, or pneumothorax. IMPRESSION: No evidence of active disease. Electronically Signed   By: Jorje Guild M.D.   On: 01/29/2023 09:54    Cardiac Studies    Echocardiogram 12/25/2021: Impressions: 1. Left ventricular ejection fraction, by estimation, is 60 to 65%. The  left ventricle has normal function. The left ventricle has no regional  wall motion abnormalities. Left ventricular diastolic parameters are  consistent with Grade I diastolic  dysfunction (impaired relaxation).   2. Right ventricular systolic function is normal. The right ventricular  size is normal.   3. Left atrial size was mildly dilated.   4. The mitral valve is abnormal. Trivial mitral valve regurgitation. No  evidence of mitral stenosis.   5. Marked calcification of the non coronary cusp. The aortic valve is  normal in structure. There is moderate calcification of the aortic valve.  There is moderate thickening of the aortic valve. Aortic valve  regurgitation is mild. Aortic valve  sclerosis/calcification is present, without any evidence of aortic  stenosis.   6. Aortic dilatation noted. There is mild dilatation of the ascending  aorta, measuring 39 mm.   7. The inferior vena cava is normal in size with greater than 50%  respiratory variability, suggesting right atrial pressure of 3 mmHg.  _______________   Carotid Dopplers 12/30/2022: Summary:  - Right Carotid: Velocities in the right ICA are consistent with a 60-79% stenosis. Difficult insonation of the right ICA, velocities may be higher than able to obtain. The distal ICA was difficult to visualize and demonstrates high-resistant flow consistent with possible distal occlusion. The ECA appears >50% stenosed. Suggest alternative imaging to further evaluate level of disease.  - Left Carotid: Velocities in the left ICA are consistent with a 1-39% stenosis.  - Vertebrals: Bilateral vertebral  arteries demonstrate antegrade flow.  - Subclavians: Normal flow hemodynamics were seen in bilateral subclavian arteries.    Neck MRA 01/18/2023: Impressions; 1. Limited noncontrast MRA, the patient declined contrast. 2. Critical stenosis versus short segment occlusion involving the proximal right ICA with downstream underfilling. Recommend further assessment to include intracranial angiography. CTA or catheter angiogram can be considered given complicating renal history.  Patient Profile     77 y.o. male with a history of CAD s/p CABG x3 (LIMA-LAD, left radial-OM1, SVG-D1) with early graft failure and redo CABG in 01/2005, critical carotid artery stenosis, hypertension, hyperlipidemia, chronic low back pain, PTSD, and obesity who presents with presyncopal complaints and transient chest pain  Assessment & Plan    CAD s/p CABG: normal biomarkers and no ischemic ECG changes. Nuclear perfusion study tomorrow. Doubt true ACS. On ASA and Repatha. Not on bet blockers due to bradycardia. SSS:  telemetry shows sinus bradycardia  and low atrial rhythm/junctional rhythm. Not clear that there is a connection between bradycardia and symptoms at this point. If this is demonstrated, he may benefit from pacemaker implantation. OSA: despite CPAP, he has daytime hypersomnolence - needs a review of device settings. R carotid stenosis: unlikely candidate for stent due to heavy calcification, may need surgical CEA. Awaiting nuclear stress test results before a decision. HTN: in target range HLP: statin myopathy, LDL 55 on Repatha.     For questions or updates, please contact [place] Please consult [email or web address] for contact info under        Signed, Sanda Klein, MD  01/30/2023, 11:58 AM

## 2023-01-30 NOTE — Assessment & Plan Note (Signed)
Vascular surgery following.  Patient had CT angiogram on admission which confirmed critical stenosis of right ACA greater than 80%.  Given age, comorbidities, severe calcification noted of right ICA and severe intracranial disease with angiographic string sign extending from area of critical stenosis to base of skull and lack of discrete  "endpoint", vascular surgery recommended medical treatment for now.  Patient already on daily aspirin.  Added Plavix and statin on discharge.  Patient states that he plans to follow-up with neurovascular surgeon at [facility] for second opinion and possible procedure.

## 2023-01-30 NOTE — Assessment & Plan Note (Signed)
At baseline 

## 2023-01-30 NOTE — Assessment & Plan Note (Signed)
Patient states that he no longer drinks

## 2023-01-31 ENCOUNTER — Telehealth: Payer: Self-pay | Admitting: Cardiology

## 2023-01-31 ENCOUNTER — Observation Stay (HOSPITAL_BASED_OUTPATIENT_CLINIC_OR_DEPARTMENT_OTHER): Payer: Medicare Other

## 2023-01-31 DIAGNOSIS — R079 Chest pain, unspecified: Secondary | ICD-10-CM

## 2023-01-31 DIAGNOSIS — I6521 Occlusion and stenosis of right carotid artery: Secondary | ICD-10-CM | POA: Diagnosis not present

## 2023-01-31 DIAGNOSIS — K5900 Constipation, unspecified: Secondary | ICD-10-CM | POA: Diagnosis present

## 2023-01-31 DIAGNOSIS — I5032 Chronic diastolic (congestive) heart failure: Secondary | ICD-10-CM | POA: Diagnosis not present

## 2023-01-31 LAB — NM MYOCAR MULTI W/SPECT W/WALL MOTION / EF
Base ST Depression (mm): 0 mm
Estimated workload: 1
Exercise duration (min): 8 min
Exercise duration (sec): 21 s
MPHR: 145 {beats}/min
Peak HR: 101 {beats}/min
Percent HR: 69 %
Rest HR: 54 {beats}/min
ST Depression (mm): 0 mm

## 2023-01-31 LAB — BRAIN NATRIURETIC PEPTIDE: B Natriuretic Peptide: 28.1 pg/mL (ref 0.0–100.0)

## 2023-01-31 MED ORDER — REGADENOSON 0.4 MG/5ML IV SOLN
INTRAVENOUS | Status: AC
  Administered 2023-01-31: 0.4 mg
  Filled 2023-01-31: qty 5

## 2023-01-31 MED ORDER — FLEET ENEMA 7-19 GM/118ML RE ENEM
1.0000 | ENEMA | Freq: Once | RECTAL | Status: AC
  Administered 2023-01-31: 1 via RECTAL
  Filled 2023-01-31: qty 1

## 2023-01-31 MED ORDER — ATORVASTATIN CALCIUM 10 MG PO TABS: 10.0000 mg | ORAL_TABLET | Freq: Every day | ORAL | 11 refills | Status: DC

## 2023-01-31 MED ORDER — SODIUM CHLORIDE 0.9 % IV SOLN: INTRAVENOUS | Status: DC

## 2023-01-31 MED ORDER — CLOPIDOGREL BISULFATE 75 MG PO TABS: 75.0000 mg | ORAL_TABLET | Freq: Every day | ORAL | 11 refills | Status: DC

## 2023-01-31 MED ORDER — TECHNETIUM TC 99M TETROFOSMIN IV KIT
10.2000 | PACK | Freq: Once | INTRAVENOUS | Status: AC | PRN
  Administered 2023-01-31: 10.2 via INTRAVENOUS

## 2023-01-31 MED ORDER — LACTULOSE 10 GM/15ML PO SOLN
10.0000 g | Freq: Once | ORAL | Status: AC
  Administered 2023-01-31: 10 g via ORAL
  Filled 2023-01-31: qty 15

## 2023-01-31 MED ORDER — REGADENOSON 0.4 MG/5ML IV SOLN
0.4000 mg | Freq: Once | INTRAVENOUS | Status: AC
  Filled 2023-01-31: qty 5

## 2023-01-31 MED ORDER — IRBESARTAN 300 MG PO TABS: 150.0000 mg | ORAL_TABLET | Freq: Every day | ORAL | Status: AC

## 2023-01-31 MED ORDER — TECHNETIUM TC 99M TETROFOSMIN IV KIT
30.0000 | PACK | Freq: Once | INTRAVENOUS | Status: AC | PRN
  Administered 2023-01-31: 30 via INTRAVENOUS

## 2023-01-31 NOTE — Discharge Summary (Signed)
Physician Discharge Summary   Patient: [name] MRN: [id number] DOB: 07-Feb-1946  Admit date:     01/29/2023  Discharge date: 01/31/23  Discharge Physician: Annita Brod   PCP: Horald Pollen, MD   Recommendations at discharge:   New medication: Plavix 75 mg p.o. daily New medication: Lipitor 10 mg p.o. daily took patient for Myoview stress test on 3/4.  Stress test unremarkable  Discharge Diagnoses: Principal Problem:   Chest pain, rule out acute myocardial infarction Active Problems:   Coronary artery disease involving coronary bypass graft of native heart without angina pectoris   Carotid artery disease (HCC)   Alcoholic cirrhosis of liver without ascites (HCC)   Essential hypertension   Chronic diastolic heart failure (HCC)   Chronic kidney disease, stage 3a (HCC)   OSA on CPAP   Dyslipidemia   Chronic alcohol abuse   PTSD (post-traumatic stress disorder)   Chronic hepatitis C without hepatic coma (HCC)   Constipation  Resolved Problems:   Hypertensive heart disease with chronic diastolic congestive heart failure [facility])  Hospital Course: 77 year old male with past medical history of diastolic CHF, obesity, CAD status post CABG, alcohol use with secondary cirrhosis, hepatitis C, CKD 3 AA and obstructive sleep apnea who had been having issues with intermittent facial numbness and had had an outpatient carotid Doppler noting critical stenosis of the right internal carotid artery versus short segment occlusion, which was then confirmed with MR angiogram.  Patient referred to vascular surgery who saw patient on 2/28 and recommended CT angiogram.  Patient presented to the emergency room on 3/2 with complaints of chest pain.  Initial EKG noted no signs of ischemia and troponin is negative.  Cardiology consulted and given patient's previous cardiac history, took patient for Myoview stress test on 3/4.  Myoview stress test unremarkable with low suspicion for ischemia.   Patient felt to be stable for discharge.  Assessment and Plan: * Chest pain, rule out acute myocardial infarction  Cardiology following.  Currently chest pain-free.  Continue aspirin.  Stress test unremarkable for evidence of acute ischemia  Carotid artery disease [facility]) Vascular surgery following.  Patient had CT angiogram on admission which confirmed critical stenosis of right ACA greater than 80%.  Given age, comorbidities, severe calcification noted of right ICA and severe intracranial disease with angiographic string sign extending from area of critical stenosis to base of skull and lack of discrete  "endpoint", vascular surgery recommended medical treatment for now.  Patient already on daily aspirin.  Added Plavix and statin on discharge.  Patient states that he plans to follow-up with neurovascular surgeon at [facility] for second opinion and possible procedure.  Alcoholic cirrhosis of liver without ascites (HCC) Labs are stable  Essential hypertension Continue home medications  Chronic kidney disease, stage 3a (HCC) At baseline  Chronic diastolic heart failure (HCC) Currently euvolemic  OSA on CPAP Continue nightly CPAP  Dyslipidemia Labs done 2 months ago and triglycerides of 174 and LDL of 47.  Given vascular surgery recommendations and at this plan, no intervention, statin added.  Constipation No response with MiraLAX or lactulose.  Gave patient's fleets enema prior to discharge.  Chronic alcohol abuse Patient states that he no longer drinks         Consultants: Vascular surgery, cardiology Procedures performed: CT angiogram of neck, Myoview stress test Disposition: Home Diet recommendation:  Discharge Diet Orders (From admission, onward)     Start     Ordered   01/31/23 0000  Diet -  low sodium heart healthy        01/31/23 1725           Heart healthy DISCHARGE MEDICATION: Allergies as of 01/31/2023       Reactions   Codeine Palpitations   Vicodin  [hydrocodone-acetaminophen] Nausea And Vomiting, Swelling, Palpitations        Medication List     TAKE these medications    aspirin EC 81 MG tablet Take 1 tablet (81 mg total) by mouth daily. Swallow whole.   atorvastatin 10 MG tablet Commonly known as: Lipitor Take 1 tablet (10 mg total) by mouth daily.   clopidogrel 75 MG tablet Commonly known as: Plavix Take 1 tablet (75 mg total) by mouth daily.   escitalopram 5 MG tablet Commonly known as: LEXAPRO Take 5 mg by mouth as needed (depression/anxiety/PTSD).   indapamide 1.25 MG tablet Commonly known as: LOZOL Take 1 tablet (1.25 mg total) by mouth daily.   irbesartan 300 MG tablet Commonly known as: AVAPRO Take 0.5 tablets (150 mg total) by mouth daily.   Repatha SureClick [id number] MG/ML Soaj Generic drug: Evolocumab INJECT 140 MG INTO THE SKIN EVERY 14 (FOURTEEN) DAYS.   sildenafil 100 MG tablet Commonly known as: VIAGRA Take 100 mg by mouth daily as needed for erectile dysfunction.   spironolactone 25 MG tablet Commonly known as: ALDACTONE Take 1 tablet (25 mg total) by mouth daily.        Follow-up Information     Cherre Robins, MD In 1 week.   Specialties: Vascular Surgery, Interventional Cardiology Contact information: [address] [place] [address] [phone]                Discharge Exam: Danley Danker Weights   01/29/23 0855 01/29/23 1900  Weight: 110.7 kg 109.4 kg   General: Alert and oriented x 3, no acute distress Cardiovascular: Regular rate and rhythm, S1-S2   Condition at discharge: good  The results of significant diagnostics from this hospitalization (including imaging, microbiology, ancillary and laboratory) are listed below for reference.   Imaging Studies: NM Myocar Multi W/Spect W/Wall Motion / EF  Result Date: 01/31/2023 CLINICAL DATA:  Chest pain 77 year old male. EXAM: MYOCARDIAL IMAGING WITH SPECT (REST AND PHARMACOLOGIC-STRESS) GATED LEFT VENTRICULAR WALL MOTION  STUDY LEFT VENTRICULAR EJECTION FRACTION TECHNIQUE: Standard myocardial SPECT imaging was performed after resting intravenous injection of 10 mCi Tc-82mtetrofosmin. Subsequently, intravenous infusion of Lexiscan was performed under the supervision of the Cardiology staff. At peak effect of the drug, 30 mCi Tc-980metrofosmin was injected intravenously and standard myocardial SPECT imaging was performed. Quantitative gated imaging was also performed to evaluate left ventricular wall motion, and estimate left ventricular ejection fraction. COMPARISON:  None Available. FINDINGS: Perfusion: No decreased activity in the left ventricle on stress imaging to suggest reversible ischemia or infarction. Wall Motion: Normal left ventricular wall motion. No left ventricular dilation. Left Ventricular Ejection Fraction: 66 % End diastolic volume 95 ml End systolic volume 33 ml IMPRESSION: 1. No reversible ischemia or infarction. 2. Normal left ventricular wall motion. 3. Left ventricular ejection fraction 66% 4. Non invasive risk stratification*: Low *2012 Appropriate Use Criteria for Coronary Revascularization Focused Update: J Am Coll Cardiol. [id number] Electronically Signed   By: StSuzy Bouchard.D.   On: 01/31/2023 15:53   CT Angio Head Neck W WO CM  Result Date: 01/29/2023 CLINICAL DATA:  Chest pain radiating to left arm with facial numbness EXAM: CT ANGIOGRAPHY HEAD AND NECK TECHNIQUE: Multidetector CT imaging of  the head and neck was performed using the standard protocol during bolus administration of intravenous contrast. Multiplanar CT image reconstructions and MIPs were obtained to evaluate the vascular anatomy. Carotid stenosis measurements (when applicable) are obtained utilizing NASCET criteria, using the distal internal carotid diameter as the denominator. RADIATION DOSE REDUCTION: This exam was performed according to the departmental  dose-optimization program which includes automated exposure control, adjustment of the mA and/or kV according to patient size and/or use of iterative reconstruction technique. CONTRAST:  59m OMNIPAQUE IOHEXOL 350 MG/ML SOLN COMPARISON:  MRA neck 01/18/2023, noncontrast CT head 08/09/2022 FINDINGS: CT HEAD FINDINGS Brain: There is no acute intracranial hemorrhage, extra-axial fluid collection, or acute infarct. Parenchymal volume is normal. The ventricles are normal in size. Gray-white differentiation is preserved. The pituitary and suprasellar region are normal. There is no mass lesion there is no mass effect or midline shift. Vascular: See below. Skull: Normal. Negative for fracture or focal lesion. Sinuses/Orbits: The paranasal sinuses are essentially clear. The globes and orbits are unremarkable. Review of the MIP images confirms the above findings CTA NECK FINDINGS Aortic arch: There is mild calcified plaque in the imaged aortic arch. The origins of the major branch vessels are patent. The subclavian arteries are patent to the level imaged. Right carotid system: The right common carotid artery is patent. There is bulky calcified plaque at the bifurcation resulting in critical stenosis of the proximal internal carotid artery with relatively diminutive caliber of the distal internal carotid artery. The external carotid artery is patent. There is no raised dissection flap or aneurysm/pseudoaneurysm. Left carotid system: The left common carotid artery is patent. There is calcified plaque in the proximal internal carotid artery resulting in up to approximately 30% stenosis. The distal internal carotid artery is patent. The external carotid artery is patent. There is no raised dissection flap or aneurysm/pseudoaneurysm. Vertebral arteries: The vertebral arteries are patent, without hemodynamically significant stenosis or occlusion. There is no evidence of dissection or aneurysm. Skeleton: There is no acute osseous  abnormality or suspicious osseous lesion. There is no visible canal hematoma. Other neck: The soft tissues of the neck are unremarkable. Upper chest: The imaged lung apices are clear. Review of the MIP images confirms the above findings CTA HEAD FINDINGS Anterior circulation: There is calcified plaque in the carotid siphons resulting in moderate to severe stenosis of the bilateral paraclinoid segments. The right M1 segment and distal branches are patent, without proximal stenosis or occlusion. The left M1 segment and distal branches are patent, without proximal stenosis or occlusion. 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 The left A1 segment is patent. The distal ACA branches are patent, without proximal stenosis or occlusion. The anterior communicating artery is normal. There is a 2-3 mm laterally directed outpouching arising from the communicating segment of the left ICA just proximal to the M1 segment suspicious for aneurysm (12-122, 13-102). Posterior circulation: There is calcified plaque in the proximal right V4 segment resulting in mild stenosis. The right V4 segment after the PICA origin is diminutive, favored developmental. The dominant left V4 segment is patent. PICA is identified bilaterally. The basilar artery is patent. The other major cerebellar arteries are patent. The bilateral PCAs are patent, without proximal stenosis or occlusion. A small right posterior communicating artery is identified There is no posterior circulation aneurysm. Venous sinuses: Patent. Anatomic variants: As above. Review of the MIP images confirms the above findings IMPRESSION: 1. No acute  intracranial pathology on noncontrast head CT. 2. Critical stenosis of the right ICA at the bifurcation with diminutive caliber of the distal ICA in the neck. 3. Approximately 30% stenosis of the proximal left ICA. Patent vertebral  arteries in the neck. 4. Calcified plaque in the carotid siphons resulting in moderate to severe stenosis bilaterally. Otherwise, patent intracranial vasculature without other proximal high-grade stenosis or occlusion. 5. Suspect 2-3 mm aneurysm arising from the communicating segment of the left ICA. Electronically Signed   By: Valetta Mole M.D.   On: 01/29/2023 13:42   DG Chest Port 1 View  Result Date: 01/29/2023 CLINICAL DATA:  Chest pain EXAM: PORTABLE CHEST 1 VIEW COMPARISON:  09/20/2022 FINDINGS: Artifact from EKG leads. Borderline heart size. CABG. There is no edema, consolidation, effusion, or pneumothorax. IMPRESSION: No evidence of active disease. Electronically Signed   By: Jorje Guild M.D.   On: 01/29/2023 09:54   MR ANGIO NECK WO CONTRAST  Result Date: 01/20/2023 CLINICAL DATA:  Carotid artery stenosis. EXAM: MRA NECK WITHOUT CONTRAST TECHNIQUE: Angiographic images of the neck were acquired using MRA technique without intravenous contrast. Carotid stenosis measurements (when applicable) are obtained utilizing NASCET criteria, using the distal internal carotid diameter as the denominator. COMPARISON:  None Available. FINDINGS: The patient declined contrast due to concern for renal impairment. Normal arch where covered with 3 vessel branching. Severe narrowing versus short segment occlusion at the origin of the right ICA with downstream underfilling. At the level of the skull base there is faint contrast. Atheromatous irregularity with mild narrowing at the left carotid bifurcation, without stenosis. Left dominant vertebral artery. Poor flow seen at the right V3 to V4 junction, likely directional dephasing. Symmetric poor visualization of the origins due to technical limitations. These results will be called to the ordering clinician or representative by the Radiologist Assistant, and communication documented in the PACS or [employer]. IMPRESSION: 1. Limited noncontrast MRA, the patient  declined contrast. 2. Critical stenosis versus short segment occlusion involving the proximal right ICA with downstream underfilling. Recommend further assessment to include intracranial angiography. CTA or catheter angiogram can be considered given complicating renal history. Electronically Signed   By: Jorje Guild M.D.   On: 01/20/2023 09:03    Microbiology: Results for orders placed or performed during the hospital encounter of 08/09/22  Resp Panel by RT-PCR (Flu A&B, Covid) Anterior Nasal Swab     Status: None   Collection Time: 08/09/22 10:01 AM   Specimen: Anterior Nasal Swab  Result Value Ref Range Status   SARS Coronavirus 2 by RT PCR NEGATIVE NEGATIVE Final    Comment: (NOTE) SARS-CoV-2 target nucleic acids are NOT DETECTED.  The SARS-CoV-2 RNA is generally detectable in upper respiratory specimens during the acute phase of infection. The lowest concentration of SARS-CoV-2 viral copies this assay can detect is 138 copies/mL. A negative result does not preclude SARS-Cov-2 infection and should not be used as the sole basis for treatment or other patient management decisions. A negative result may occur with  improper specimen collection/handling, submission of specimen other than nasopharyngeal swab, presence of viral mutation(s) within the areas targeted by this assay, and inadequate number of viral copies(<138 copies/mL). A negative result must be combined with clinical observations, patient history, and epidemiological information. The expected result is Negative.  Fact Sheet for Patients:  EntrepreneurPulse.com.au  Fact Sheet for Healthcare Providers:  IncredibleEmployment.be  This test is no t yet approved or cleared by the [place] and  has been authorized  for detection and/or diagnosis of SARS-CoV-2 by FDA under an Emergency Use Authorization (EUA). This EUA will remain  in effect (meaning this test can be used) for the  duration of the COVID-19 declaration under Section 564(b)(1) of the Act, 21 U.S.C.section 360bbb-3(b)(1), unless the authorization is terminated  or revoked sooner.       Influenza A by PCR NEGATIVE NEGATIVE Final   Influenza B by PCR NEGATIVE NEGATIVE Final    Comment: (NOTE) The Xpert Xpress SARS-CoV-2/FLU/RSV plus assay is intended as an aid in the diagnosis of influenza from Nasopharyngeal swab specimens and should not be used as a sole basis for treatment. Nasal washings and aspirates are unacceptable for Xpert Xpress SARS-CoV-2/FLU/RSV testing.  Fact Sheet for Patients: EntrepreneurPulse.com.au  Fact Sheet for Healthcare Providers: IncredibleEmployment.be  This test is not yet approved or cleared by the [place] FDA and has been authorized for detection and/or diagnosis of SARS-CoV-2 by FDA under an Emergency Use Authorization (EUA). This EUA will remain in effect (meaning this test can be used) for the duration of the COVID-19 declaration under Section 564(b)(1) of the Act, 21 U.S.C. section 360bbb-3(b)(1), unless the authorization is terminated or revoked.  Performed at [facility], [place] [address] [place], [place] [address]     Labs: CBC: Recent Labs  Lab 01/29/23 0916  WBC 5.6  HGB 14.8  HCT 43.6  MCV 88.1  PLT PLATELET CLUMPS NOTED ON SMEAR, UNABLE TO ESTIMATE   Basic Metabolic Panel: Recent Labs  Lab 01/29/23 0916 01/30/23 0603  NA 135 137  K 4.4 4.4  CL 102 106  CO2 24 21*  GLUCOSE 117* 103*  BUN 26* 24*  CREATININE 1.34* 1.30*  CALCIUM 9.5 9.0   Liver Function Tests: Recent Labs  Lab 01/29/23 0916  AST 30  ALT 34  ALKPHOS 49  BILITOT 0.8  PROT 8.9*  ALBUMIN 4.3   CBG: No results for input(s): "GLUCAP" in the last 168 hours.  Discharge time spent: less than 30 minutes.  Signed: Annita Brod, MD [facility] 01/31/2023

## 2023-01-31 NOTE — Progress Notes (Signed)
Mobility Specialist Progress Note:   01/31/23 1610  Mobility  Activity Ambulated independently in hallway  Level of Assistance Independent  Assistive Device None  Distance Ambulated (ft) 1000 ft  Activity Response Tolerated well  Mobility Referral Yes  $Mobility charge 1 Mobility   Pt eager for mobility session. Required no physical assistance throughout. No c/o throughout session. HR 70bpm with exertion. Pt left sitting EOB with all needs met.   Nelta Numbers Mobility Specialist Please contact via SecureChat or  Rehab office at [phone]

## 2023-01-31 NOTE — Progress Notes (Signed)
Patient tried nasal and full sized mask. Masks did not fit right. Patient would like to pass on cpap tonight and wear nasal cannula.

## 2023-01-31 NOTE — Assessment & Plan Note (Signed)
No response with MiraLAX or lactulose.  Gave patient's fleets enema prior to discharge.

## 2023-01-31 NOTE — Progress Notes (Addendum)
Rounding Note    Patient Name: [name] Date of Encounter: 01/31/2023  [place] Cardiologist: New to [facility]  Subjective   Patient resting comfortably. Denies chest pain, palpitations, shortness of breath, dizziness/lightheadedness.  Inpatient Medications    Scheduled Meds:  acetaminophen  1,000 mg Oral Once   aspirin EC  81 mg Oral Daily   enoxaparin (LOVENOX) injection  0.5 mg/kg Subcutaneous Q24H   escitalopram  5 mg Oral Daily   indapamide  1.25 mg Oral Daily   irbesartan  150 mg Oral Daily   polyethylene glycol  17 g Oral Daily   Continuous Infusions:  PRN Meds: acetaminophen, melatonin, ondansetron (ZOFRAN) IV   Vital Signs    Vitals:   01/30/23 1226 01/30/23 1637 01/30/23 1940 01/31/23 0417  BP: (!) 164/69 (!) 147/63 131/65 126/62  Pulse: (!) 55 60 (!) 57   Resp: '18 18 20 16  '$ Temp: 98.2 F (36.8 C)  98.5 F (36.9 C) 98.5 F (36.9 C)  TempSrc: Oral Oral Oral Oral  SpO2: 100% 100% 100% 100%  Weight:      Height:        Intake/Output Summary (Last 24 hours) at 01/31/2023 0720 Last data filed at 01/31/2023 0541 Gross per 24 hour  Intake 240 ml  Output 500 ml  Net -260 ml      01/29/2023    7:00 PM 01/29/2023    8:55 AM 01/24/2023   10:22 AM  Last 3 Weights  Weight (lbs) 241 lb 1.6 oz 244 lb 243 lb  Weight (kg) 109.362 kg 110.678 kg 110.224 kg      Telemetry    Sinus rhythm, borderline first degree AV block - Personally Reviewed  ECG    No new tracing - Personally Reviewed  Physical Exam   GEN: No acute distress.   Neck: No JVD Cardiac: RRR, no murmurs, rubs, or gallops.  Respiratory: Clear to auscultation bilaterally. GI: Soft, nontender, non-distended  MS: No edema; No deformity. Neuro:  Nonfocal  Psych: Normal affect   Labs    High Sensitivity Troponin:   Recent Labs  Lab 01/29/23 0916 01/29/23 1130 01/30/23 0603  TROPONINIHS '6 6 6     '$ Chemistry Recent Labs  Lab 01/29/23 0916  01/30/23 0603  NA 135 137  K 4.4 4.4  CL 102 106  CO2 24 21*  GLUCOSE 117* 103*  BUN 26* 24*  CREATININE 1.34* 1.30*  CALCIUM 9.5 9.0  PROT 8.9*  --   ALBUMIN 4.3  --   AST 30  --   ALT 34  --   ALKPHOS 49  --   BILITOT 0.8  --   GFRNONAA 55* 57*  ANIONGAP 9 10    Lipids No results for input(s): "CHOL", "TRIG", "HDL", "LABVLDL", "LDLCALC", "CHOLHDL" in the last 168 hours.  Hematology Recent Labs  Lab 01/29/23 0916  WBC 5.6  RBC 4.95  HGB 14.8  HCT 43.6  MCV 88.1  MCH 29.9  MCHC 33.9  RDW 12.3  PLT PLATELET CLUMPS NOTED ON SMEAR, UNABLE TO ESTIMATE   Thyroid No results for input(s): "TSH", "FREET4" in the last 168 hours.  BNP Recent Labs  Lab 01/31/23 0214  BNP 28.1    DDimer No results for input(s): "DDIMER" in the last 168 hours.   Radiology    CT Angio Head Neck W WO CM  Result Date: 01/29/2023 CLINICAL DATA:  Chest pain radiating to left arm with facial numbness EXAM: CT ANGIOGRAPHY HEAD  AND NECK TECHNIQUE: Multidetector CT imaging of the head and neck was performed using the standard protocol during bolus administration of intravenous contrast. Multiplanar CT image reconstructions and MIPs were obtained to evaluate the vascular anatomy. Carotid stenosis measurements (when applicable) are obtained utilizing NASCET criteria, using the distal internal carotid diameter as the denominator. RADIATION DOSE REDUCTION: This exam was performed according to the departmental dose-optimization program which includes automated exposure control, adjustment of the mA and/or kV according to patient size and/or use of iterative reconstruction technique. CONTRAST:  29m OMNIPAQUE IOHEXOL 350 MG/ML SOLN COMPARISON:  MRA neck 01/18/2023, noncontrast CT head 08/09/2022 FINDINGS: CT HEAD FINDINGS Brain: There is no acute intracranial hemorrhage, extra-axial fluid collection, or acute infarct. Parenchymal volume is normal. The ventricles are normal in size. Gray-white differentiation is  preserved. The pituitary and suprasellar region are normal. There is no mass lesion there is no mass effect or midline shift. Vascular: See below. Skull: Normal. Negative for fracture or focal lesion. Sinuses/Orbits: The paranasal sinuses are essentially clear. The globes and orbits are unremarkable. Review of the MIP images confirms the above findings CTA NECK FINDINGS Aortic arch: There is mild calcified plaque in the imaged aortic arch. The origins of the major branch vessels are patent. The subclavian arteries are patent to the level imaged. Right carotid system: The right common carotid artery is patent. There is bulky calcified plaque at the bifurcation resulting in critical stenosis of the proximal internal carotid artery with relatively diminutive caliber of the distal internal carotid artery. The external carotid artery is patent. There is no raised dissection flap or aneurysm/pseudoaneurysm. Left carotid system: The left common carotid artery is patent. There is calcified plaque in the proximal internal carotid artery resulting in up to approximately 30% stenosis. The distal internal carotid artery is patent. The external carotid artery is patent. There is no raised dissection flap or aneurysm/pseudoaneurysm. Vertebral arteries: The vertebral arteries are patent, without hemodynamically significant stenosis or occlusion. There is no evidence of dissection or aneurysm. Skeleton: There is no acute osseous abnormality or suspicious osseous lesion. There is no visible canal hematoma. Other neck: The soft tissues of the neck are unremarkable. Upper chest: The imaged lung apices are clear. Review of the MIP images confirms the above findings CTA HEAD FINDINGS Anterior circulation: There is calcified plaque in the carotid siphons resulting in moderate to severe stenosis of the bilateral paraclinoid segments. The right M1 segment and distal branches are patent, without proximal stenosis or occlusion. The left M1  segment and distal branches are patent, without proximal stenosis or occlusion. 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 The left A1 segment is patent. The distal ACA branches are patent, without proximal stenosis or occlusion. The anterior communicating artery is normal. There is a 2-3 mm laterally directed outpouching arising from the communicating segment of the left ICA just proximal to the M1 segment suspicious for aneurysm (12-122, 13-102). Posterior circulation: There is calcified plaque in the proximal right V4 segment resulting in mild stenosis. The right V4 segment after the PICA origin is diminutive, favored developmental. The dominant left V4 segment is patent. PICA is identified bilaterally. The basilar artery is patent. The other major cerebellar arteries are patent. The bilateral PCAs are patent, without proximal stenosis or occlusion. A small right posterior communicating artery is identified There is no posterior circulation aneurysm. Venous sinuses: Patent. Anatomic variants: As above. Review of the MIP images confirms  the above findings IMPRESSION: 1. No acute intracranial pathology on noncontrast head CT. 2. Critical stenosis of the right ICA at the bifurcation with diminutive caliber of the distal ICA in the neck. 3. Approximately 30% stenosis of the proximal left ICA. Patent vertebral arteries in the neck. 4. Calcified plaque in the carotid siphons resulting in moderate to severe stenosis bilaterally. Otherwise, patent intracranial vasculature without other proximal high-grade stenosis or occlusion. 5. Suspect 2-3 mm aneurysm arising from the communicating segment of the left ICA. Electronically Signed   By: Valetta Mole M.D.   On: 01/29/2023 13:42   DG Chest Port 1 View  Result Date: 01/29/2023 CLINICAL DATA:  Chest pain EXAM: PORTABLE CHEST 1 VIEW COMPARISON:  09/20/2022 FINDINGS:  Artifact from EKG leads. Borderline heart size. CABG. There is no edema, consolidation, effusion, or pneumothorax. IMPRESSION: No evidence of active disease. Electronically Signed   By: Jorje Guild M.D.   On: 01/29/2023 09:54    Cardiac Studies   Cardiac Studies & Procedures       ECHOCARDIOGRAM  ECHOCARDIOGRAM COMPLETE 12/25/2021  Narrative ECHOCARDIOGRAM REPORT    Patient Name:   NKRUMAH HACKERT [facility] Date of Exam: 12/25/2021 Medical Rec #:  [id number]       Height:       70.0 in Accession #:    [id number]      Weight:       235.0 lb Date of Birth:  Nov 16, 1946       BSA:          2.235 m Patient Age:    38 years        BP:           138/82 mmHg Patient Gender: M               HR:           75 bpm. Exam Location:  [facility]  Procedure: 2D Echo, Cardiac Doppler and Color Doppler  Indications:    I50.9* Heart failure (unspecified)  History:        Patient has prior history of Echocardiogram examinations, most recent 02/21/2014. CAD, Prior CABG; Risk Factors:Hypertension and Dyslipidemia. Chronic low back pain. Bronchitis.  Sonographer:    Diamond Nickel RCS Referring Phys: [place]   1. Left ventricular ejection fraction, by estimation, is 60 to 65%. The left ventricle has normal function. The left ventricle has no regional wall motion abnormalities. Left ventricular diastolic parameters are consistent with Grade I diastolic dysfunction (impaired relaxation). 2. Right ventricular systolic function is normal. The right ventricular size is normal. 3. Left atrial size was mildly dilated. 4. The mitral valve is abnormal. Trivial mitral valve regurgitation. No evidence of mitral stenosis. 5. Marked calcification of the non coronary cusp. The aortic valve is normal in structure. There is moderate calcification of the aortic valve. There is moderate thickening of the aortic valve. Aortic valve regurgitation is mild. Aortic valve sclerosis/calcification is  present, without any evidence of aortic stenosis. 6. Aortic dilatation noted. There is mild dilatation of the ascending aorta, measuring 39 mm. 7. The inferior vena cava is normal in size with greater than 50% respiratory variability, suggesting right atrial pressure of 3 mmHg.  FINDINGS Left Ventricle: Left ventricular ejection fraction, by estimation, is 60 to 65%. The left ventricle has normal function. The left ventricle has no regional wall motion abnormalities. The left ventricular internal cavity size was normal in size. There is no left ventricular hypertrophy. Left  ventricular diastolic parameters are consistent with Grade I diastolic dysfunction (impaired relaxation).  Right Ventricle: The right ventricular size is normal. No increase in right ventricular wall thickness. Right ventricular systolic function is normal.  Left Atrium: Left atrial size was mildly dilated.  Right Atrium: Right atrial size was normal in size.  Pericardium: There is no evidence of pericardial effusion.  Mitral Valve: The mitral valve is abnormal. There is mild thickening of the mitral valve leaflet(s). There is mild calcification of the mitral valve leaflet(s). Mild mitral annular calcification. Trivial mitral valve regurgitation. No evidence of mitral valve stenosis.  Tricuspid Valve: The tricuspid valve is normal in structure. Tricuspid valve regurgitation is mild . No evidence of tricuspid stenosis.  Aortic Valve: Marked calcification of the non coronary cusp. The aortic valve is normal in structure. There is moderate calcification of the aortic valve. There is moderate thickening of the aortic valve. Aortic valve regurgitation is mild. Aortic regurgitation PHT measures 479 msec. Aortic valve sclerosis/calcification is present, without any evidence of aortic stenosis.  Pulmonic Valve: The pulmonic valve was normal in structure. Pulmonic valve regurgitation is not visualized. No evidence of pulmonic  stenosis.  Aorta: Aortic dilatation noted. There is mild dilatation of the ascending aorta, measuring 39 mm.  Venous: The inferior vena cava is normal in size with greater than 50% respiratory variability, suggesting right atrial pressure of 3 mmHg.  IAS/Shunts: The interatrial septum was not well visualized.   LEFT VENTRICLE PLAX 2D LVIDd:         3.70 cm   Diastology LVIDs:         2.40 cm   LV e' medial:    6.85 cm/s LV PW:         1.20 cm   LV E/e' medial:  11.1 LV IVS:        1.10 cm   LV e' lateral:   12.20 cm/s LVOT diam:     2.15 cm   LV E/e' lateral: 6.2 LV SV:         103 LV SV Index:   46 LVOT Area:     3.63 cm   RIGHT VENTRICLE RV Basal diam:  3.40 cm RV S prime:     8.81 cm/s TAPSE (M-mode): 1.1 cm RVSP:           24.5 mmHg  LEFT ATRIUM             Index        RIGHT ATRIUM           Index LA diam:        3.80 cm 1.70 cm/m   RA Pressure: 3.00 mmHg LA Vol (A2C):   69.8 ml 31.22 ml/m  RA Area:     15.70 cm LA Vol (A4C):   55.3 ml 24.74 ml/m  RA Volume:   40.25 ml  18.01 ml/m LA Biplane Vol: 64.0 ml 28.63 ml/m AORTIC VALVE LVOT Vmax:   153.00 cm/s LVOT Vmean:  89.100 cm/s LVOT VTI:    0.284 m AI PHT:      479 msec  AORTA Ao Root diam: 3.10 cm Ao Asc diam:  3.90 cm  MITRAL VALVE                TRICUSPID VALVE MV Area (PHT): 2.74 cm     TR Peak grad:   21.5 mmHg MV Decel Time: 277 msec     TR Vmax:        232.00 cm/s  MV E velocity: 76.00 cm/s   Estimated RAP:  3.00 mmHg MV A velocity: 104.00 cm/s  RVSP:           24.5 mmHg MV E/A ratio:  0.73 SHUNTS Systemic VTI:  0.28 m Systemic Diam: 2.15 cm  Jenkins Rouge MD Electronically signed by Jenkins Rouge MD Signature Date/Time: 12/25/2021/7:35:42 PM    Final              Patient Profile     77 y.o. male with a history of CAD s/p CABG x3 (LIMA-LAD, left radial-OM1, SVG-D1) with early graft failure and redo CABG in 01/2005, critical carotid artery stenosis, hypertension, hyperlipidemia, chronic  low back pain, PTSD, and obesity who presents with presyncopal complaints and transient chest pain.  Assessment & Plan    CAD s/p CABG  Patient with history of CABG x3 with LIMA to LAD, left radial to OM1, and SVG to 1st Diag in 01/2005. Then had early graft failure and redo CABG a few days later. CABG x3 with LIMA to LAD, left radial to OM1, and SVG to 1st Diag in 01/2005. Patient now admitted following chest discomfort in the setting of a syncopal/vasovagal episode. Troponin negative x2, ECG without acute ischemic changes.   Overall low suspicion for ACS. As below, likely to need surgical management for severe carotid stenosis. Nuclear stress test today.  Sick Sinus Syndrome  Telemetry so far this admission shows chronic bradycardia. May require additional monitoring to further assess clinical significance of bradycardia. Patient without any symptoms such as dizziness/lightheadedness today.  Right carotid stenosis  CTA head/nec on 3/2 with critical stenosis of right ICA at bifurcation. Plan for nuclear stress test today re surgical intervention risk given recent chest pressure symptoms.   Hyperlipidemia  Patient intolerant to statins with myopathy. Now on Repatha with LDL at goal.   Hypertension  BP generally at goal this admission with Irbesartan.   OSA  Appears that patient is struggling with fit/settings at home. Also noted to have this issue while admitted. Given clinical evidence of OSA with daytime somnolence, will need device setting/equipment type optimization with his sleep specialist.          For questions or updates, please contact [place] Please consult [email or web address] for contact info under        Signed, Lily Kocher, PA-C  01/31/2023, 7:20 AM     Personally seen and examined. Agree with APP above with the following comments:  Briefly 77 yo M with hx of CAD s/p CABG with Critical RCA stenosis.  Patient notes that he has no symptoms.  His  prior anginal equivalent was sharp chest pain. Stress test has been performed but not uploaded at time of evaluation BP is at goal He notes that he is not planned for surgery at this time regardless of stress test (See Vascular surgery note for further clarification). No CP, SOB.  Would recommend  - We will likely plan for medical therapy unless significant reduced in LVEF given his lack of sx - at time of eval, BP, cholesterol are controlled.  Patient has no sx on current therapy. - if not planned for surgery, likely discharge if stress test is negative and no procedure is planned.  Rudean Haskell, MD [facility] [place] [place], [place] [address] [phone]  2:51 PM

## 2023-01-31 NOTE — Progress Notes (Addendum)
[facility]  Subjective  - No new symptoms, no weakness, tolerating PO's and ambulated yesterday.   Objective 126/62 (!) 57 98.5 F (36.9 C) (Oral) 16 100%  Intake/Output Summary (Last 24 hours) at 01/31/2023 0735 Last data filed at 01/31/2023 0541 Gross per 24 hour  Intake 240 ml  Output 500 ml  Net -260 ml    Moving all 4 extremities Radial, DP B palpable Speech clear, no SOB Lungs non labored breathing No acute distress    Assessment/Planning: CTA demonstrates critical stenosis of the right ICA > 80%.  He has a history of vag B UE weakness, facial droop... He was recently evaluated in our office by DR. Mariko Nowakowski pending further work up with CTA for right ICA stenosis   Pending Stress test DR. Stanford Breed will discuss surgical options   [name] 01/31/2023 7:35 AM --  Laboratory Lab Results: Recent Labs    01/29/23 0916  WBC 5.6  HGB 14.8  HCT 43.6  PLT PLATELET CLUMPS NOTED ON SMEAR, UNABLE TO ESTIMATE   BMET Recent Labs    01/29/23 0916 01/30/23 0603  NA 135 137  K 4.4 4.4  CL 102 106  CO2 24 21*  GLUCOSE 117* 103*  BUN 26* 24*  CREATININE 1.34* 1.30*  CALCIUM 9.5 9.0    COAG Lab Results  Component Value Date   INR 1.1 (H) 12/30/2021   INR 1.3 (H) 02/24/2019   INR 1.08 08/25/2012   No results found for: "PTT"  VASCULAR STAFF ADDENDUM: I have independently interviewed and examined the patient. I agree with the above.   I reviewed the patient's symptoms with him. I still feel like this does not represent symptomatic carotid disease.  CT angiogram personally reviewed in detail Angiographic string sign extending from critical stenosis to base of skull. There may be occlusion vs. Critical stenosis in the terminal ICA / base of skull.   I do not think surgical intervention is wise given the severe intracranial disease, and the lack of a discrete "end point." His age and comorbidities also make  asymptomatic carotid intervention more risky and less beneficial.  Recommend best medical therapy for now: ASA, Plavix, Statin.  Will follow up stress test and review data with the patient.   Yevonne Aline. Stanford Breed, MD [facility] Phone Number: [phone] 01/31/2023 10:03 AM

## 2023-01-31 NOTE — Telephone Encounter (Unsigned)
Pt is calling very upset about the information he found out today about not being a surgical candidate for his severe intracranial disease.  Dr. Roselie Awkward with [facility] saw the pt today in the hospital (pt was referred to him back in late Feb for abnormal MRA of neck showing critical narrowing of right ICA). Pt saw Dr. Stanford Breed with [facility] as a new pt on 2/26.  Pt has since went to the hospital for chest pain on 3/2 and currently admitted.  Dr. Mora Appl did see the pt in the hospital today and below is his discussion with the pt about his CT Angiogram (copied from progress note from Dr. Roselie Awkward with the pt today in hospital).    Pt is calling very upset and frustrated with the news and is personally requesting a call back from Dr. Johney Frame sometime soon, to discuss her thoughts on this matter.  Pt states he will speak with Dr. Roselie Awkward again tomorrow and seek his personal opinion as to where he suggest that the he get a second opinion for surgical intervention of vascular findings.    Pt is demanding that I pass this message along to Dr. Johney Frame and have her call him directly on this matter.  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 Johney Frame to take over his cardiac care.  Pt is established to meet Dr. Johney Frame in the later part of this month on 3/22.  Pt is aware that Dr. Johney Frame is not in the office at this time but I will be more than glad to send this message to her to seek her advisement on this matter.    Pt verbalized understanding and agrees with this plan.  Off note: Spent over 20 mins on the phone with the pt about concerns.     VASCULAR STAFF ADDENDUM: I have independently interviewed and examined the patient. I agree with the above.    I reviewed the patient's symptoms with him. I still feel like this does not represent symptomatic carotid disease.   CT angiogram personally reviewed in detail Angiographic string sign extending from critical stenosis to base of skull. There  may be occlusion vs. Critical stenosis in the terminal ICA / base of skull.    I do not think surgical intervention is wise given the severe intracranial disease, and the lack of a discrete "end point." His age and comorbidities also make asymptomatic carotid intervention more risky and less beneficial.   Recommend best medical therapy for now: ASA, Plavix, Statin.   Will follow up stress test and review data with the patient.    Yevonne Aline. Stanford Breed, MD [facility] Phone Number: [phone] 01/31/2023 10:03 AM

## 2023-01-31 NOTE — Telephone Encounter (Signed)
New Message:      Patient said he needs to talk to Dr Jacolyn Reedy nurse right away. He said he just received some bad news at the hospital. He said he needs help righ away. Tjis is very serious, about his Carotid artery.

## 2023-02-01 ENCOUNTER — Other Ambulatory Visit: Payer: Self-pay

## 2023-02-01 DIAGNOSIS — I6523 Occlusion and stenosis of bilateral carotid arteries: Secondary | ICD-10-CM

## 2023-02-01 NOTE — Progress Notes (Signed)
Error

## 2023-02-03 ENCOUNTER — Ambulatory Visit (HOSPITAL_BASED_OUTPATIENT_CLINIC_OR_DEPARTMENT_OTHER): Payer: Medicare Other

## 2023-02-08 ENCOUNTER — Ambulatory Visit: Payer: Medicare Other | Admitting: Vascular Surgery

## 2023-02-08 DIAGNOSIS — G4733 Obstructive sleep apnea (adult) (pediatric): Secondary | ICD-10-CM | POA: Diagnosis not present

## 2023-02-11 DIAGNOSIS — R1084 Generalized abdominal pain: Secondary | ICD-10-CM | POA: Diagnosis not present

## 2023-02-11 DIAGNOSIS — Z9862 Peripheral vascular angioplasty status: Secondary | ICD-10-CM | POA: Diagnosis not present

## 2023-02-11 DIAGNOSIS — R0602 Shortness of breath: Secondary | ICD-10-CM | POA: Diagnosis not present

## 2023-02-11 DIAGNOSIS — Z8719 Personal history of other diseases of the digestive system: Secondary | ICD-10-CM | POA: Diagnosis not present

## 2023-02-11 DIAGNOSIS — R001 Bradycardia, unspecified: Secondary | ICD-10-CM | POA: Diagnosis not present

## 2023-02-11 DIAGNOSIS — Z951 Presence of aortocoronary bypass graft: Secondary | ICD-10-CM | POA: Diagnosis not present

## 2023-02-11 DIAGNOSIS — R42 Dizziness and giddiness: Secondary | ICD-10-CM | POA: Diagnosis not present

## 2023-02-11 DIAGNOSIS — R109 Unspecified abdominal pain: Secondary | ICD-10-CM | POA: Diagnosis not present

## 2023-02-13 DIAGNOSIS — R001 Bradycardia, unspecified: Secondary | ICD-10-CM | POA: Diagnosis not present

## 2023-02-13 DIAGNOSIS — R0602 Shortness of breath: Secondary | ICD-10-CM | POA: Diagnosis not present

## 2023-02-15 NOTE — Progress Notes (Deleted)
Cardiology Office Note:   Date:  02/15/2023  ID:  [name], DOB 1946/04/27, MRN [id number]  History of Present Illness:   [name] is a 77 y.o. male with a history of CAD s/p CABG x3 (LIMA-LAD, left radial-OM1, SVG-D1) with early graft failure and redo CABG in 01/2005, critical carotid artery stenosis, hypertension, hyperlipidemia, chronic low back pain, PTSD, and obesity who presents to clinic for follow-up.  Per review of the record, the patient has a long history of CAD. He underwent CABG x3 with LIMA to LAD, left radial to OM1, and SVG to 1st Diag in 01/2005. He unfortunately had a early graft failure due to mechanical complication of LIMA to LAD requiring redo CABG a few days later. TTE 11/2021 showed LVEF of 60-65% with no regional wall motion abnormalities and grade 1 diastolic dysfunction as well as moderate thickening and calcification of aortic valve but no aortic stenosis and mild dilatation of the ascending aorta measuring 39 mm.   Patient was last seen by Dr. Tamala Julian in 07/2022 at which time he was doing well with no angina. Carotid dopplers on 12/30/2022 showed 60-79% stenosis of right ICA but distal ICA was difficult to visualize and demonstrated high-resistant flow consistent with possible distal occlusion. CTA of the neck was ordered for further evaluation. However, patient was worried about the effect of the contrast dye on his kidneys so this was switched to a MRA of neck. MRA on 01/18/2023 showed critical stenosis vs short segment occlusion involving the proximal right ICA with downstream underfilling. Was seen by vascular and was recommended for continued medical management given extent of intracranial disease without discrete "end point."   Was admitted 01/29/23 for atypical chest pain. Trop was negative x2. Myoview 01/02/23 wit LVEF 66%, no ischemia or infarction. Overall low risk study.  Today, ***    ROS: ***  Studies Reviewed:    EKG:  ***  Cardiac Studies &  Procedures     STRESS TESTS  NM MYOCAR MULTI W/SPECT W 01/31/2023  Narrative CLINICAL DATA:  Chest pain 77 year old male.  EXAM: MYOCARDIAL IMAGING WITH SPECT (REST AND PHARMACOLOGIC-STRESS)  GATED LEFT VENTRICULAR WALL MOTION STUDY  LEFT VENTRICULAR EJECTION FRACTION  TECHNIQUE: Standard myocardial SPECT imaging was performed after resting intravenous injection of 10 mCi Tc-71m tetrofosmin. Subsequently, intravenous infusion of Lexiscan was performed under the supervision of the Cardiology staff. At peak effect of the drug, 30 mCi Tc-76m tetrofosmin was injected intravenously and standard myocardial SPECT imaging was performed. Quantitative gated imaging was also performed to evaluate left ventricular wall motion, and estimate left ventricular ejection fraction.  COMPARISON:  None Available.  FINDINGS: Perfusion: No decreased activity in the left ventricle on stress imaging to suggest reversible ischemia or infarction.  Wall Motion: Normal left ventricular wall motion. No left ventricular dilation.  Left Ventricular Ejection Fraction: 66 %  End diastolic volume 95 ml  End systolic volume 33 ml  IMPRESSION: 1. No reversible ischemia or infarction.  2. Normal left ventricular wall motion.  3. Left ventricular ejection fraction 66%  4. Non invasive risk stratification*: Low  *2012 Appropriate Use Criteria for Coronary Revascularization Focused Update: J Am Coll Cardiol. [id number]. http://content.airportbarriers.com.aspx?articleid=1201161   Electronically Signed By: Suzy Bouchard M.D. On: 01/31/2023 15:53   ECHOCARDIOGRAM  ECHOCARDIOGRAM COMPLETE 12/25/2021  Narrative ECHOCARDIOGRAM REPORT    Patient Name:   Ronald LEVE [facility] Date of Exam: 12/25/2021 Medical Rec #:  [id number]       Height:  70.0 in Accession #:    [id number]      Weight:       235.0 lb Date of Birth:  Feb 21, 1946       BSA:          2.235 m Patient Age:    77 years         BP:           138/82 mmHg Patient Gender: M               HR:           75 bpm. Exam Location:  [facility]  Procedure: 2D Echo, Cardiac Doppler and Color Doppler  Indications:    I50.9* Heart failure (unspecified)  History:        Patient has prior history of Echocardiogram examinations, most recent 02/21/2014. CAD, Prior CABG; Risk Factors:Hypertension and Dyslipidemia. Chronic low back pain. Bronchitis.  Sonographer:    Diamond Nickel RCS Referring Phys: [place]   1. Left ventricular ejection fraction, by estimation, is 60 to 65%. The left ventricle has normal function. The left ventricle has no regional wall motion abnormalities. Left ventricular diastolic parameters are consistent with Grade I diastolic dysfunction (impaired relaxation). 2. Right ventricular systolic function is normal. The right ventricular size is normal. 3. Left atrial size was mildly dilated. 4. The mitral valve is abnormal. Trivial mitral valve regurgitation. No evidence of mitral stenosis. 5. Marked calcification of the non coronary cusp. The aortic valve is normal in structure. There is moderate calcification of the aortic valve. There is moderate thickening of the aortic valve. Aortic valve regurgitation is mild. Aortic valve sclerosis/calcification is present, without any evidence of aortic stenosis. 6. Aortic dilatation noted. There is mild dilatation of the ascending aorta, measuring 39 mm. 7. The inferior vena cava is normal in size with greater than 50% respiratory variability, suggesting right atrial pressure of 3 mmHg.  FINDINGS Left Ventricle: Left ventricular ejection fraction, by estimation, is 60 to 65%. The left ventricle has normal function. The left ventricle has no regional wall motion abnormalities. The left ventricular internal cavity size was normal in size. There is no left ventricular hypertrophy. Left ventricular diastolic parameters are consistent with Grade  I diastolic dysfunction (impaired relaxation).  Right Ventricle: The right ventricular size is normal. No increase in right ventricular wall thickness. Right ventricular systolic function is normal.  Left Atrium: Left atrial size was mildly dilated.  Right Atrium: Right atrial size was normal in size.  Pericardium: There is no evidence of pericardial effusion.  Mitral Valve: The mitral valve is abnormal. There is mild thickening of the mitral valve leaflet(s). There is mild calcification of the mitral valve leaflet(s). Mild mitral annular calcification. Trivial mitral valve regurgitation. No evidence of mitral valve stenosis.  Tricuspid Valve: The tricuspid valve is normal in structure. Tricuspid valve regurgitation is mild . No evidence of tricuspid stenosis.  Aortic Valve: Marked calcification of the non coronary cusp. The aortic valve is normal in structure. There is moderate calcification of the aortic valve. There is moderate thickening of the aortic valve. Aortic valve regurgitation is mild. Aortic regurgitation PHT measures 479 msec. Aortic valve sclerosis/calcification is present, without any evidence of aortic stenosis.  Pulmonic Valve: The pulmonic valve was normal in structure. Pulmonic valve regurgitation is not visualized. No evidence of pulmonic stenosis.  Aorta: Aortic dilatation noted. There is mild dilatation of the ascending aorta, measuring 39 mm.  Venous: The inferior vena  cava is normal in size with greater than 50% respiratory variability, suggesting right atrial pressure of 3 mmHg.  IAS/Shunts: The interatrial septum was not well visualized.   LEFT VENTRICLE PLAX 2D LVIDd:         3.70 cm   Diastology LVIDs:         2.40 cm   LV e' medial:    6.85 cm/s LV PW:         1.20 cm   LV E/e' medial:  11.1 LV IVS:        1.10 cm   LV e' lateral:   12.20 cm/s LVOT diam:     2.15 cm   LV E/e' lateral: 6.2 LV SV:         103 LV SV Index:   46 LVOT Area:     3.63  cm   RIGHT VENTRICLE RV Basal diam:  3.40 cm RV S prime:     8.81 cm/s TAPSE (M-mode): 1.1 cm RVSP:           24.5 mmHg  LEFT ATRIUM             Index        RIGHT ATRIUM           Index LA diam:        3.80 cm 1.70 cm/m   RA Pressure: 3.00 mmHg LA Vol (A2C):   69.8 ml 31.22 ml/m  RA Area:     15.70 cm LA Vol (A4C):   55.3 ml 24.74 ml/m  RA Volume:   40.25 ml  18.01 ml/m LA Biplane Vol: 64.0 ml 28.63 ml/m AORTIC VALVE LVOT Vmax:   153.00 cm/s LVOT Vmean:  89.100 cm/s LVOT VTI:    0.284 m AI PHT:      479 msec  AORTA Ao Root diam: 3.10 cm Ao Asc diam:  3.90 cm  MITRAL VALVE                TRICUSPID VALVE MV Area (PHT): 2.74 cm     TR Peak grad:   21.5 mmHg MV Decel Time: 277 msec     TR Vmax:        232.00 cm/s MV E velocity: 76.00 cm/s   Estimated RAP:  3.00 mmHg MV A velocity: 104.00 cm/s  RVSP:           24.5 mmHg MV E/A ratio:  0.73 SHUNTS Systemic VTI:  0.28 m Systemic Diam: 2.15 cm  Jenkins Rouge MD Electronically signed by Jenkins Rouge MD Signature Date/Time: 12/25/2021/7:35:42 PM    Final             Risk Assessment/Calculations:   {Does this patient have ATRIAL FIBRILLATION?:[id number]} No BP recorded.  {Refresh Note OR Click here to enter BP  :1}***        Physical Exam:   VS:  There were no vitals taken for this visit.   Wt Readings from Last 3 Encounters:  01/29/23 241 lb 1.6 oz (109.4 kg)  01/24/23 243 lb (110.2 kg)  09/08/22 241 lb 6 oz (109.5 kg)     GEN: Well nourished, well developed in no acute distress NECK: No JVD; No carotid bruits CARDIAC: ***RRR, no murmurs, rubs, gallops RESPIRATORY:  Clear to auscultation without rales, wheezing or rhonchi  ABDOMEN: Soft, non-tender, non-distended EXTREMITIES:  No edema; No deformity   ASSESSMENT AND PLAN:   #CAD s/p CABG: -Reassuring myoview 01/2023 with no recurrence of chest pain -Continue ASA 81mg  daily,  plavix 75mg  -Continue lipitor 10mg , repatha 140mg  q 2weeks -Not on BB due to  bradycardia  #SSS: -Off BB  -No clear indication for PPM at this time  #Right carotid stenosis: -Has extensive intracranial disease with no discrete "end point" -Recommended for continued medical management per vascular surgery -Continue ASA 81mg  daily, plavix 75mg  daily,  repatha 140mg  q 2weeks  #OSA: -Continue CPAP  #HTN: -Continue irebesartan 300mg  daily -Continue spironolactone 25mg  daily  #HLD: #Statin Intolerance: -Continue repatha 140mg  q2 weeks -Continue lipitor 10mg  daily    {Are you ordering a CV Procedure (e.g. stress test, cath, DCCV, TEE, etc)?   Press F2        :[id number]   Signed, Freada Bergeron, MD

## 2023-02-18 ENCOUNTER — Ambulatory Visit: Payer: Medicare Other | Attending: Cardiology | Admitting: Cardiology

## 2023-03-01 NOTE — Progress Notes (Deleted)
[facility] [address] [place]. [place] [address] [phone]       OFFICE FOLLOW UP NOTE  [name] Date of Birth:  [birth year] Medical Record Number:  [id number]   Reason for visit: Initial CPAP follow-up    SUBJECTIVE:   CHIEF COMPLAINT:  No chief complaint on file.  Follow-up visit:  Prior visit: 06/30/2022   Brief HPI:   [name] is a 77 y.o. male who is being followed for OSA on CPAP.  He was initially evaluated by Dr. Rexene Alberts 01/19/2022 for concern of underlying sleep apnea.  Sleep study 01/27/2022 showed overall mild OSA with total AHI of 8.6/h although severe during REM sleep with 33.8/h.  Recommended initiation of AutoPap with set up date 04/02/2022.  At prior visit, compliance report showed suboptimal usage although optimal residual AHI with use due to some difficulty tolerating.  Interval history:   Update 06/30/2022 JM: Patient is being seen for initial compliance visit.  CPAP initiated 5/5.  Review of compliance report shows suboptimal usage although optimal residual AHI.  Reports recently making adjustments to his CPAP mask and has been able to tolerate better for longer duration of time.  As noted in compliance report as below, he has been consistently using his CPAP machine greater than 4 hours nightly for the past 2 weeks.  He has already noticed some improvement in regards to his daytime fatigue.  He does work long hours outside as a Ambulance person and believes this could be contributing some to his fatigue.  Epworth Sleepiness Scale 18/24 (prior 19/24).         ROS:   14 system review of systems performed and negative with exception of those listed in HPI  PMH:  Past Medical History:  Diagnosis Date   Anginal pain ([place]) 01/2005   Anxiety    Arthritis    "knees"   Chronic lower back pain    Colon polyp    Coronary artery disease    Depression    Hepatitis    "don't know what kind; they treated me for it; was a long  time ago" (12/11/2018)   High cholesterol    History of bronchitis    "used to get it alot" (08/25/2012)   Hypertension    Panic attacks    PTSD (post-traumatic stress disorder)    "have been treated in the past" (08/25/2012)    PSH:  Past Surgical History:  Procedure Laterality Date   CARDIAC CATHETERIZATION  01/2005   CORONARY ARTERY BYPASS GRAFT  01/2005   CABG X3   Shrapnel Left 1969   LLE; left lateral thumb (required grafting)   VARICOSE VEIN SURGERY  1980's   LLE    Social History:  Social History   Socioeconomic History   Marital status: Divorced    Spouse name: Not on file   Number of children: Not on file   Years of education: Not on file   Highest education level: Not on file  Occupational History   Not on file  Tobacco Use   Smoking status: Former    Packs/day: 2.00    Years: 5.00    Additional pack years: 0.00    Total pack years: 10.00    Types: Cigarettes   Smokeless tobacco: Never   Tobacco comments:    08/25/2012 "quit smoking cigarettes 40 yr ago"  Vaping Use   Vaping Use: Never used  Substance and Sexual Activity   Alcohol use: Not Currently  Alcohol/week: 60.0 standard drinks of alcohol    Types: 60 Standard drinks or equivalent per week    Comment: 12/11/2018 "~ 1 pint of vodka/day"   Drug use: Not Currently    Types: Marijuana   Sexual activity: Yes  Other Topics Concern   Not on file  Social History Narrative   Not on file   Social Determinants of Health   Financial Resource Strain: Low Risk  (07/14/2022)   Overall Financial Resource Strain (CARDIA)    Difficulty of Paying Living Expenses: Not hard at all  Food Insecurity: No Food Insecurity (07/14/2022)   Hunger Vital Sign    Worried About Running Out of Food in the Last Year: Never true    Ran Out of Food in the Last Year: Never true  Transportation Needs: No Transportation Needs (07/14/2022)   PRAPARE - Hydrologist (Medical): No    Lack of  Transportation (Non-Medical): No  Physical Activity: Sufficiently Active (07/14/2022)   Exercise Vital Sign    Days of Exercise per Week: 6 days    Minutes of Exercise per Session: 60 min  Stress: No Stress Concern Present (07/14/2022)   [place]    Feeling of Stress : Not at all  Social Connections: Moderately Integrated (07/14/2022)   Social Connection and Isolation Panel [NHANES]    Frequency of Communication with Friends and Family: More than three times a week    Frequency of Social Gatherings with Friends and Family: More than three times a week    Attends Religious Services: More than 4 times per year    Active Member of [employer] or Organizations: Yes    Attends Music therapist: More than 4 times per year    Marital Status: Divorced  Intimate Partner Violence: Not At Risk (07/14/2022)   Humiliation, Afraid, Rape, and Kick questionnaire    Fear of Current or Ex-Partner: No    Emotionally Abused: No    Physically Abused: No    Sexually Abused: No    Family History:  Family History  Problem Relation Age of Onset   Diabetes Mother    Heart disease Father    Sleep apnea Sister    Cancer Brother    Colon cancer Neg Hx    Liver cancer Neg Hx    Pancreatic cancer Neg Hx    Esophageal cancer Neg Hx    Stomach cancer Neg Hx     Medications:   Current Outpatient Medications on File Prior to Visit  Medication Sig Dispense Refill   aspirin EC 81 MG tablet Take 1 tablet (81 mg total) by mouth daily. Swallow whole. 90 tablet 3   atorvastatin (LIPITOR) 10 MG tablet Take 1 tablet (10 mg total) by mouth daily. 30 tablet 11   clopidogrel (PLAVIX) 75 MG tablet Take 1 tablet (75 mg total) by mouth daily. 30 tablet 11   escitalopram (LEXAPRO) 5 MG tablet Take 5 mg by mouth as needed (depression/anxiety/PTSD).     indapamide (LOZOL) 1.25 MG tablet Take 1 tablet (1.25 mg total) by mouth daily. (Patient not taking:  Reported on 01/30/2023) 90 tablet 3   irbesartan (AVAPRO) 300 MG tablet Take 0.5 tablets (150 mg total) by mouth daily.     REPATHA SURECLICK [id number] MG/ML SOAJ INJECT 140 MG INTO THE SKIN EVERY 14 (FOURTEEN) DAYS. 2 mL 11   sildenafil (VIAGRA) 100 MG tablet Take 100 mg by mouth daily as needed  for erectile dysfunction.     spironolactone (ALDACTONE) 25 MG tablet Take 1 tablet (25 mg total) by mouth daily. (Patient not taking: Reported on 01/24/2023) 30 tablet 5   No current facility-administered medications on file prior to visit.    Allergies:   Allergies  Allergen Reactions   Codeine Palpitations   Vicodin [Hydrocodone-Acetaminophen] Nausea And Vomiting, Swelling and Palpitations      OBJECTIVE:  Physical Exam  There were no vitals filed for this visit.  There is no height or weight on file to calculate BMI. No results found.   General: well developed, well nourished, very pleasant elderly African-American male, seated, in no evident distress Head: head normocephalic and atraumatic.   Neck: supple with no carotid or supraclavicular bruits Cardiovascular: regular rate and rhythm, no murmurs Musculoskeletal: no deformity Skin:  no rash/petichiae Vascular:  Normal pulses all extremities   Neurologic Exam Mental Status: Awake and fully alert. Oriented to place and time. Recent and remote memory intact. Attention span, concentration and fund of knowledge appropriate. Mood and affect appropriate.  Cranial Nerves: Pupils equal, briskly reactive to light. Extraocular movements full without nystagmus. Visual fields full to confrontation. Hearing intact. Facial sensation intact. Face, tongue, palate moves normally and symmetrically.  Motor: Normal bulk and tone. Normal strength in all tested extremity muscles Sensory.: intact to touch , pinprick , position and vibratory sensation.  Coordination: Rapid alternating movements normal in all extremities. Finger-to-nose and heel-to-shin  performed accurately bilaterally. Gait and Station: Arises from chair without difficulty. Stance is normal. Gait demonstrates normal stride length and balance without use of AD. Tandem walk and heel toe without difficulty.  Reflexes: 1+ and symmetric. Toes downgoing.         ASSESSMENT: JATAYVION MONGIELLO is a 77 y.o. year old male recent diagnosis of sleep apnea and initiation of CPAP 03/2022.      PLAN:  OSA on CPAP : Discussed importance of increasing nightly usage greater than 4 hours per night for optimal benefit and for insurance purposes but preferably use of CPAP throughout the duration of sleeping.  Continue current AutoPap settings and continue to follow with DME company for any needed supplies or CPAP related concerns.    Follow up in 6 months or call earlier if needed   CC:  PCP: Horald Pollen, MD    I spent 21 minutes of face-to-face and non-face-to-face time with patient.  This included previsit chart review, lab review, study review, order entry, electronic health record documentation, patient education regarding diagnosis of sleep apnea with review and discussion of compliance report and answered all other questions to patient's satisfaction   Frann Rider, [facility]  [facility] [address] [place], [place] [address]  Phone [phone] Fax [phone] Note: This document was prepared with digital dictation and possible smart phrase technology. Any transcriptional errors that result from this process are unintentional.

## 2023-03-02 ENCOUNTER — Ambulatory Visit: Payer: Medicare Other | Admitting: Adult Health

## 2023-03-22 ENCOUNTER — Ambulatory Visit: Payer: Medicare Other | Admitting: Vascular Surgery

## 2023-04-07 DIAGNOSIS — R001 Bradycardia, unspecified: Secondary | ICD-10-CM | POA: Diagnosis not present

## 2023-04-07 DIAGNOSIS — E785 Hyperlipidemia, unspecified: Secondary | ICD-10-CM | POA: Diagnosis not present

## 2023-04-07 DIAGNOSIS — I509 Heart failure, unspecified: Secondary | ICD-10-CM | POA: Diagnosis not present

## 2023-04-07 DIAGNOSIS — I10 Essential (primary) hypertension: Secondary | ICD-10-CM | POA: Diagnosis not present

## 2023-04-07 DIAGNOSIS — Z951 Presence of aortocoronary bypass graft: Secondary | ICD-10-CM | POA: Diagnosis not present

## 2023-04-07 DIAGNOSIS — I2510 Atherosclerotic heart disease of native coronary artery without angina pectoris: Secondary | ICD-10-CM | POA: Diagnosis not present

## 2023-04-07 DIAGNOSIS — I779 Disorder of arteries and arterioles, unspecified: Secondary | ICD-10-CM | POA: Diagnosis not present

## 2023-04-07 DIAGNOSIS — R002 Palpitations: Secondary | ICD-10-CM | POA: Diagnosis not present

## 2023-04-13 ENCOUNTER — Telehealth: Payer: Self-pay | Admitting: Pharmacist

## 2023-04-13 NOTE — Telephone Encounter (Signed)
Received fax on [facility] approval for pt through 03/07/24 for his Repatha. He must have initiated this himself as we have not been working with him recently, and looks like pt is following with [facility] cardiology now. Approval letter scanned into media tab.

## 2023-05-03 DIAGNOSIS — R002 Palpitations: Secondary | ICD-10-CM | POA: Diagnosis not present

## 2023-05-03 DIAGNOSIS — I509 Heart failure, unspecified: Secondary | ICD-10-CM | POA: Diagnosis not present

## 2023-05-03 DIAGNOSIS — I10 Essential (primary) hypertension: Secondary | ICD-10-CM | POA: Diagnosis not present

## 2023-05-03 DIAGNOSIS — I779 Disorder of arteries and arterioles, unspecified: Secondary | ICD-10-CM | POA: Diagnosis not present

## 2023-05-03 DIAGNOSIS — Z951 Presence of aortocoronary bypass graft: Secondary | ICD-10-CM | POA: Diagnosis not present

## 2023-05-03 DIAGNOSIS — I2510 Atherosclerotic heart disease of native coronary artery without angina pectoris: Secondary | ICD-10-CM | POA: Diagnosis not present

## 2023-05-03 DIAGNOSIS — E785 Hyperlipidemia, unspecified: Secondary | ICD-10-CM | POA: Diagnosis not present

## 2023-05-03 DIAGNOSIS — R001 Bradycardia, unspecified: Secondary | ICD-10-CM | POA: Diagnosis not present

## 2023-05-09 ENCOUNTER — Ambulatory Visit: Payer: Medicare Other | Admitting: Neurology

## 2023-05-09 ENCOUNTER — Encounter: Payer: Self-pay | Admitting: Neurology

## 2023-05-09 NOTE — Progress Notes (Deleted)
Patient: [name] Date of Birth: [birth year]  Reason for Visit: Follow up History from: Patient Primary Neurologist: Frances Furbish   ASSESSMENT AND PLAN 77 y.o. year old male    HISTORY OF PRESENT ILLNESS: Today 05/09/23  HISTORY  Update 06/30/2022 JM: Patient is being seen for initial compliance visit.  CPAP initiated 5/5.  Review of compliance report shows suboptimal usage although optimal residual AHI.  Reports recently making adjustments to his CPAP mask and has been able to tolerate better for longer duration of time.  As noted in compliance report as below, he has been consistently using his CPAP machine greater than 4 hours nightly for the past 2 weeks.  He has already noticed some improvement in regards to his daytime fatigue.  He does work long hours outside as a Merchandiser, retail and believes this could be contributing some to his fatigue.  Epworth Sleepiness Scale 18/24 (prior 19/24).             Consult visit 01/19/2022 Dr. Frances Furbish:  [name] is a 77 year old right-handed gentleman with an underlying medical history of aortic atherosclerosis, carotid artery stenosis, chronic diastolic congestive heart failure, alcohol use disorder, liver cirrhosis, thrombocytopenia, coronary artery disease with status post three-vessel bypass in 2006, chronic hepatitis C, hypertension, hyperlipidemia, anxiety, arthritis, low back pain, and obesity, who reports snoring and excessive daytime somnolence.  I reviewed your office note from 12/09/2021.  His Epworth sleepiness score is 19 out of 24, fatigue severity score is 40 out of 63.  He is divorced and lives alone.  He has older children from previous marriage and has younger children from his recent marriage, his daughter is 49 and his son is 8.  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  He reports a variable sleep schedule, generally in bed between 9 and 11 PM and rise time varies. His sleep is interrupted, he sleeps about 3-4 hours at a time.  He has  nocturia about 2-3 times per average night and has had some morning headaches.  His sister has sleep apnea but he is not sure if she actually has a CPAP machine.  He quit smoking over 50 years ago.  He drinks caffeine in the form of tea, 1 or 2 glasses/day on average.  He has not had any alcohol for over 1 year.  REVIEW OF SYSTEMS: Out of a complete 14 system review of symptoms, the patient complains only of the following symptoms, and all other reviewed systems are negative.  See HPI  ALLERGIES: Allergies  Allergen Reactions   Codeine Palpitations   Vicodin [Hydrocodone-Acetaminophen] Nausea And Vomiting, Swelling and Palpitations    HOME MEDICATIONS: Outpatient Medications Prior to Visit  Medication Sig Dispense Refill   aspirin EC 81 MG tablet Take 1 tablet (81 mg total) by mouth daily. Swallow whole. 90 tablet 3   atorvastatin (LIPITOR) 10 MG tablet Take 1 tablet (10 mg total) by mouth daily. 30 tablet 11   clopidogrel (PLAVIX) 75 MG tablet Take 1 tablet (75 mg total) by mouth daily. 30 tablet 11   escitalopram (LEXAPRO) 5 MG tablet Take 5 mg by mouth as needed (depression/anxiety/PTSD).     indapamide (LOZOL) 1.25 MG tablet Take 1 tablet (1.25 mg total) by mouth daily. (Patient not taking: Reported on 01/30/2023) 90 tablet 3   irbesartan (AVAPRO) 300 MG tablet Take 0.5 tablets (150 mg total) by mouth daily.     REPATHA SURECLICK 140 MG/ML SOAJ INJECT 140 MG INTO THE SKIN EVERY  14 (FOURTEEN) DAYS. 2 mL 11   sildenafil (VIAGRA) 100 MG tablet Take 100 mg by mouth daily as needed for erectile dysfunction.     spironolactone (ALDACTONE) 25 MG tablet Take 1 tablet (25 mg total) by mouth daily. (Patient not taking: Reported on 01/24/2023) 30 tablet 5   No facility-administered medications prior to visit.    PAST MEDICAL HISTORY: Past Medical History:  Diagnosis Date   Anginal pain (HCC) 01/2005   Anxiety    Arthritis    "knees"   Chronic lower back pain    Colon polyp    Coronary  artery disease    Depression    Hepatitis    "don't know what kind; they treated me for it; was a long time ago" (12/11/2018)   High cholesterol    History of bronchitis    "used to get it alot" (08/25/2012)   Hypertension    Panic attacks    PTSD (post-traumatic stress disorder)    "have been treated in the past" (08/25/2012)    PAST SURGICAL HISTORY: Past Surgical History:  Procedure Laterality Date   CARDIAC CATHETERIZATION  01/2005   CORONARY ARTERY BYPASS GRAFT  01/2005   CABG X3   Shrapnel Left 1969   LLE; left lateral thumb (required grafting)   VARICOSE VEIN SURGERY  1980's   LLE    FAMILY HISTORY: Family History  Problem Relation Age of Onset   Diabetes Mother    Heart disease Father    Sleep apnea Sister    Cancer Brother    Colon cancer Neg Hx    Liver cancer Neg Hx    Pancreatic cancer Neg Hx    Esophageal cancer Neg Hx    Stomach cancer Neg Hx     SOCIAL HISTORY: Social History   Socioeconomic History   Marital status: Divorced    Spouse name: Not on file   Number of children: Not on file   Years of education: Not on file   Highest education level: Not on file  Occupational History   Not on file  Tobacco Use   Smoking status: Former    Packs/day: 2.00    Years: 5.00    Additional pack years: 0.00    Total pack years: 10.00    Types: Cigarettes   Smokeless tobacco: Never   Tobacco comments:    08/25/2012 "quit smoking cigarettes 40 yr ago"  Vaping Use   Vaping Use: Never used  Substance and Sexual Activity   Alcohol use: Not Currently    Alcohol/week: 60.0 standard drinks of alcohol    Types: 60 Standard drinks or equivalent per week    Comment: 12/11/2018 "~ 1 pint of vodka/day"   Drug use: Not Currently    Types: Marijuana   Sexual activity: Yes  Other Topics Concern   Not on file  Social History Narrative   Not on file   Social Determinants of Health   Financial Resource Strain: Low Risk  (07/14/2022)   Overall Financial Resource  Strain (CARDIA)    Difficulty of Paying Living Expenses: Not hard at all  Food Insecurity: No Food Insecurity (07/14/2022)   Hunger Vital Sign    Worried About Running Out of Food in the Last Year: Never true    Ran Out of Food in the Last Year: Never true  Transportation Needs: No Transportation Needs (07/14/2022)   PRAPARE - Administrator, Civil Service (Medical): No    Lack of Transportation (Non-Medical): No  Physical Activity: Sufficiently Active (07/14/2022)   Exercise Vital Sign    Days of Exercise per Week: 6 days    Minutes of Exercise per Session: 60 min  Stress: No Stress Concern Present (07/14/2022)   [employer] of Occupational Health - Occupational Stress Questionnaire    Feeling of Stress : Not at all  Social Connections: Moderately Integrated (07/14/2022)   Social Connection and Isolation Panel [NHANES]    Frequency of Communication with Friends and Family: More than three times a week    Frequency of Social Gatherings with Friends and Family: More than three times a week    Attends Religious Services: More than 4 times per year    Active Member of [employer] or Organizations: Yes    Attends Engineer, structural: More than 4 times per year    Marital Status: Divorced  Intimate Partner Violence: Not At Risk (07/14/2022)   Humiliation, Afraid, Rape, and Kick questionnaire    Fear of Current or Ex-Partner: No    Emotionally Abused: No    Physically Abused: No    Sexually Abused: No    PHYSICAL EXAM  There were no vitals filed for this visit. There is no height or weight on file to calculate BMI.  Generalized: Well developed, in no acute distress  Neurological examination  Mentation: Alert oriented to time, place, history taking. Follows all commands speech and language fluent Cranial nerve II-XII: Pupils were equal round reactive to light. Extraocular movements were full, visual field were full on confrontational test. Facial sensation and  strength were normal. Uvula tongue midline. Head turning and shoulder shrug  were normal and symmetric. Motor: The motor testing reveals 5 over 5 strength of all 4 extremities. Good symmetric motor tone is noted throughout.  Sensory: Sensory testing is intact to soft touch on all 4 extremities. No evidence of extinction is noted.  Coordination: Cerebellar testing reveals good finger-nose-finger and heel-to-shin bilaterally.  Gait and station: Gait is normal. Tandem gait is normal. Romberg is negative. No drift is seen.  Reflexes: Deep tendon reflexes are symmetric and normal bilaterally.   DIAGNOSTIC DATA (LABS, IMAGING, TESTING) - I reviewed patient records, labs, notes, testing and imaging myself where available.  Lab Results  Component Value Date   WBC 5.6 01/29/2023   HGB 14.8 01/29/2023   HCT 43.6 01/29/2023   MCV 88.1 01/29/2023   PLT PLATELET CLUMPS NOTED ON SMEAR, UNABLE TO ESTIMATE 01/29/2023      Component Value Date/Time   NA 137 01/30/2023 0603   NA 136 12/03/2021 1407   K 4.4 01/30/2023 0603   CL 106 01/30/2023 0603   CO2 21 (L) 01/30/2023 0603   GLUCOSE 103 (H) 01/30/2023 0603   BUN 24 (H) 01/30/2023 0603   BUN 23 12/03/2021 1407   CREATININE 1.30 (H) 01/30/2023 0603   CREATININE 1.51 (H) 03/18/2022 1131   CALCIUM 9.0 01/30/2023 0603   PROT 8.9 (H) 01/29/2023 0916   PROT 8.2 12/03/2021 1407   ALBUMIN 4.3 01/29/2023 0916   ALBUMIN 4.3 12/03/2021 1407   AST 30 01/29/2023 0916   ALT 34 01/29/2023 0916   ALKPHOS 49 01/29/2023 0916   BILITOT 0.8 01/29/2023 0916   BILITOT 0.5 12/03/2021 1407   GFRNONAA 57 (L) 01/30/2023 0603   GFRAA 83 01/06/2021 1401   Lab Results  Component Value Date   CHOL 128 12/03/2021   HDL 44 12/03/2021   LDLCALC 55 12/03/2021   LDLDIRECT 47 12/03/2021   TRIG 174 (H) 12/03/2021  CHOLHDL 2.9 12/03/2021   Lab Results  Component Value Date   HGBA1C 5.9 (H) 09/17/2021   No results found for: "VITAMINB12" No results found for:  "TSH"  Margie Ege, AGNP-C, DNP 05/09/2023, 5:47 AM [facility] [address], [address] [place], [place] [address] [phone]

## 2023-05-16 DIAGNOSIS — I509 Heart failure, unspecified: Secondary | ICD-10-CM | POA: Diagnosis not present

## 2023-05-16 DIAGNOSIS — R002 Palpitations: Secondary | ICD-10-CM | POA: Diagnosis not present

## 2023-05-16 DIAGNOSIS — R001 Bradycardia, unspecified: Secondary | ICD-10-CM | POA: Diagnosis not present

## 2023-05-16 DIAGNOSIS — E785 Hyperlipidemia, unspecified: Secondary | ICD-10-CM | POA: Diagnosis not present

## 2023-05-16 DIAGNOSIS — I10 Essential (primary) hypertension: Secondary | ICD-10-CM | POA: Diagnosis not present

## 2023-05-16 DIAGNOSIS — I779 Disorder of arteries and arterioles, unspecified: Secondary | ICD-10-CM | POA: Diagnosis not present

## 2023-05-16 DIAGNOSIS — Z951 Presence of aortocoronary bypass graft: Secondary | ICD-10-CM | POA: Diagnosis not present

## 2023-05-16 DIAGNOSIS — I2510 Atherosclerotic heart disease of native coronary artery without angina pectoris: Secondary | ICD-10-CM | POA: Diagnosis not present

## 2023-05-23 DIAGNOSIS — I779 Disorder of arteries and arterioles, unspecified: Secondary | ICD-10-CM | POA: Diagnosis not present

## 2023-05-23 DIAGNOSIS — E785 Hyperlipidemia, unspecified: Secondary | ICD-10-CM | POA: Diagnosis not present

## 2023-05-23 DIAGNOSIS — R002 Palpitations: Secondary | ICD-10-CM | POA: Diagnosis not present

## 2023-05-23 DIAGNOSIS — Z951 Presence of aortocoronary bypass graft: Secondary | ICD-10-CM | POA: Diagnosis not present

## 2023-05-23 DIAGNOSIS — I2510 Atherosclerotic heart disease of native coronary artery without angina pectoris: Secondary | ICD-10-CM | POA: Diagnosis not present

## 2023-05-23 DIAGNOSIS — I509 Heart failure, unspecified: Secondary | ICD-10-CM | POA: Diagnosis not present

## 2023-05-23 DIAGNOSIS — I10 Essential (primary) hypertension: Secondary | ICD-10-CM | POA: Diagnosis not present

## 2023-05-23 DIAGNOSIS — R001 Bradycardia, unspecified: Secondary | ICD-10-CM | POA: Diagnosis not present

## 2023-05-30 DIAGNOSIS — I7121 Aneurysm of the ascending aorta, without rupture: Secondary | ICD-10-CM | POA: Diagnosis not present

## 2023-05-30 DIAGNOSIS — I2510 Atherosclerotic heart disease of native coronary artery without angina pectoris: Secondary | ICD-10-CM | POA: Diagnosis not present

## 2023-05-30 DIAGNOSIS — Z951 Presence of aortocoronary bypass graft: Secondary | ICD-10-CM | POA: Diagnosis not present

## 2023-05-31 ENCOUNTER — Encounter: Payer: Self-pay | Admitting: Physical Medicine and Rehabilitation

## 2023-05-31 ENCOUNTER — Other Ambulatory Visit: Payer: Self-pay | Admitting: Physical Medicine and Rehabilitation

## 2023-05-31 ENCOUNTER — Ambulatory Visit (INDEPENDENT_AMBULATORY_CARE_PROVIDER_SITE_OTHER): Payer: Medicare Other | Admitting: Physical Medicine and Rehabilitation

## 2023-05-31 DIAGNOSIS — M5416 Radiculopathy, lumbar region: Secondary | ICD-10-CM | POA: Diagnosis not present

## 2023-05-31 DIAGNOSIS — G8929 Other chronic pain: Secondary | ICD-10-CM

## 2023-05-31 DIAGNOSIS — M5441 Lumbago with sciatica, right side: Secondary | ICD-10-CM

## 2023-05-31 DIAGNOSIS — M47816 Spondylosis without myelopathy or radiculopathy, lumbar region: Secondary | ICD-10-CM

## 2023-05-31 MED ORDER — PREDNISONE 50 MG PO TABS: 50.0000 mg | ORAL_TABLET | Freq: Every day | ORAL | 0 refills | Status: DC

## 2023-05-31 NOTE — Progress Notes (Signed)
Functional Pain Scale - descriptive words and definitions  Distressing (6)    Pain is present/unable to complete most ADLs limited by pain/sleep is difficult and active distraction is only marginal. Moderate range order  Average Pain 9  Lower back pain on right side that radiates into the right leg

## 2023-05-31 NOTE — Progress Notes (Signed)
[name] - 77 y.o. male MRN [id number]  Date of birth: Aug 07, 1946  Office Visit Note: Visit Date: 05/31/2023 PCP: Georgina Quint, MD Referred by: Georgina Quint, *  Subjective: Chief Complaint  Patient presents with   Lower Back - Pain   HPI: [name] is a 77 y.o. male who comes in today for evaluation of chronic, worsening and severe right sided lower back pain radiating to buttock and posterior thigh to knee. Pain ongoing for several years, worsens with prolonged sitting. He describes pain as sore and aching, currently rates as 9 out of 10. Some relief of pain with home exercise regimen, rest and use of medications. Patient attends exercises classes at [facility] multiple times a week. Lumbar x-rays from 2022 exhibit mild multi level degenerative changes. No prior lumbar MRI imaging. Patient underwent right L5-S1 interlaminar epidural steroid injection in our office on 11/18/2021, he reports significant and sustained relief of pain for over a year with this procedure. Patient would is requesting repeat injection as quickly as possible. States his pain feels identical to issues he experienced several years ago. Patient denies focal weakness, numbness and tingling. No recent trauma or falls.    Review of Systems  Musculoskeletal:  Positive for back pain.  Neurological:  Negative for tingling, sensory change, focal weakness and weakness.  All other systems reviewed and are negative.  Otherwise per HPI.  Assessment & Plan: Visit Diagnoses:    ICD-10-CM   1. Chronic right-sided low back pain with right-sided sciatica  M54.41 Ambulatory referral to Physical Medicine Rehab   G89.29     2. Lumbar radiculopathy  M54.16 Ambulatory referral to Physical Medicine Rehab    3. Facet arthropathy, lumbar  M47.816 Ambulatory referral to Physical Medicine Rehab       Plan: Findings:  Chronic, worsening and severe right sided lower back pain radiating to buttock and posterior  thigh to knee.  Patient continues to have severe pain despite good conservative therapy such as home exercise regimen, rest and use of medications.  Patient's clinical presentation are consistent with S1 nerve pattern.  Given his symptoms are similar to prior issues in 2022, no new issues the next step is to perform diagnostic and hopefully therapeutic right L5-S1 interlaminar epidural steroid injection under fluoroscopic guidance.  He is not currently taking anticoagulant medication.  If good relief of pain with injection we can repeat this procedure infrequently as needed.  Patient did voice he would like to get injection as soon as possible, I informed him we would get injection approved with insurance and get him in as quickly as possible.  He did inquire about receiving injection at [facility], however he ultimately decided to undergo injection in our office.  If his pain persists we discussed the possibility of obtaining lumbar MRI imaging.  I discussed medication management with him today and did prescribe short course of oral prednisone, also instructed him to take Tylenol 500 mg 3 times a day as needed.  I encouraged patient to remain active as tolerated, he can continue his exercise program at the [facility].  No red flag symptoms noted upon exam today.    Meds & Orders:  Meds ordered this encounter  Medications   predniSONE (DELTASONE) 50 MG tablet    Sig: Take 1 tablet (50 mg total) by mouth daily with breakfast. Take until completed.    Dispense:  5 tablet    Refill:  0    Orders Placed This Encounter  Procedures   Ambulatory referral to Physical Medicine Rehab    Follow-up: Return for Right L5-S1 interlaminar epidural steroid injection.   Procedures: No procedures performed      Clinical History: No specialty comments available.   He reports that he has quit smoking. His smoking use included cigarettes. He has a 10.00 pack-year smoking history. He has never used smokeless  tobacco. No results for input(s): "HGBA1C", "LABURIC" in the last 8760 hours.  Objective:  VS:  HT:    WT:   BMI:     BP:   HR: bpm  TEMP: ( )  RESP:  Physical Exam Vitals and nursing note reviewed.  HENT:     Head: Normocephalic and atraumatic.     Right Ear: External ear normal.     Left Ear: External ear normal.     Nose: Nose normal.     Mouth/Throat:     Mouth: Mucous membranes are moist.  Eyes:     Extraocular Movements: Extraocular movements intact.  Cardiovascular:     Rate and Rhythm: Normal rate.     Pulses: Normal pulses.  Pulmonary:     Effort: Pulmonary effort is normal.  Abdominal:     General: Abdomen is flat. There is no distension.  Musculoskeletal:        General: Tenderness present.     Cervical back: Normal range of motion.     Comments: Patient rises from seated position to standing without difficulty. Good lumbar range of motion. No pain noted with facet loading. 5/5 strength noted with bilateral hip flexion, knee flexion/extension, ankle dorsiflexion/plantarflexion and EHL. No clonus noted bilaterally. No pain upon palpation of greater trochanters. No pain with internal/external rotation of bilateral hips. Sensation intact bilaterally. Dysesthesias noted to right S1 dermatome. Negative slump test bilaterally. Ambulates without aid, gait steady.     Skin:    General: Skin is warm and dry.     Capillary Refill: Capillary refill takes less than 2 seconds.  Neurological:     General: No focal deficit present.     Mental Status: He is alert and oriented to person, place, and time.  Psychiatric:        Mood and Affect: Mood normal.        Behavior: Behavior normal.     Ortho Exam  Imaging: No results found.  Past Medical/Family/Surgical/Social History: Medications & Allergies reviewed per EMR, new medications updated. Patient Active Problem List   Diagnosis Date Noted   Constipation 01/31/2023   Chronic kidney disease, stage 3a (HCC) 01/30/2023    Chest pain, rule out acute myocardial infarction 01/29/2023   Carotid artery disease (HCC) 01/29/2023   OSA on CPAP 09/08/2022   Solid nodule of lung greater than 8 mm in diameter 08/16/2022   Acute sinusitis 03/24/2022   Chronic hepatitis C without hepatic coma (HCC) 01/26/2022   Atherosclerosis of aorta (HCC) 06/16/2021   Alcoholic cirrhosis of liver without ascites (HCC) 06/16/2021   Calculus of gallbladder without cholecystitis without obstruction 06/16/2021   Chronic diastolic heart failure (HCC) 12/11/2020   Alcohol abuse with alcohol-induced mood disorder (HCC) 06/16/2019   PTSD (post-traumatic stress disorder) 06/16/2019   Thrombocytopenia (HCC) 02/24/2019   Chronic alcohol abuse 12/09/2015   Coronary artery disease involving coronary bypass graft of native heart without angina pectoris 08/07/2014   Essential hypertension 08/07/2014   Dyslipidemia 08/07/2014   Past Medical History:  Diagnosis Date   Anginal pain (HCC) 01/2005   Anxiety    Arthritis    "  knees"   Chronic lower back pain    Colon polyp    Coronary artery disease    Depression    Hepatitis    "don't know what kind; they treated me for it; was a long time ago" (12/11/2018)   High cholesterol    History of bronchitis    "used to get it alot" (08/25/2012)   Hypertension    Panic attacks    PTSD (post-traumatic stress disorder)    "have been treated in the past" (08/25/2012)   Family History  Problem Relation Age of Onset   Diabetes Mother    Heart disease Father    Sleep apnea Sister    Cancer Brother    Colon cancer Neg Hx    Liver cancer Neg Hx    Pancreatic cancer Neg Hx    Esophageal cancer Neg Hx    Stomach cancer Neg Hx    Past Surgical History:  Procedure Laterality Date   CARDIAC CATHETERIZATION  01/2005   CORONARY ARTERY BYPASS GRAFT  01/2005   CABG X3   Shrapnel Left 1969   LLE; left lateral thumb (required grafting)   VARICOSE VEIN SURGERY  1980's   LLE   Social History    Occupational History   Not on file  Tobacco Use   Smoking status: Former    Packs/day: 2.00    Years: 5.00    Additional pack years: 0.00    Total pack years: 10.00    Types: Cigarettes   Smokeless tobacco: Never   Tobacco comments:    08/25/2012 "quit smoking cigarettes 40 yr ago"  Vaping Use   Vaping Use: Never used  Substance and Sexual Activity   Alcohol use: Not Currently    Alcohol/week: 60.0 standard drinks of alcohol    Types: 60 Standard drinks or equivalent per week    Comment: 12/11/2018 "~ 1 pint of vodka/day"   Drug use: Not Currently    Types: Marijuana   Sexual activity: Yes

## 2023-06-06 DIAGNOSIS — G4733 Obstructive sleep apnea (adult) (pediatric): Secondary | ICD-10-CM | POA: Diagnosis not present

## 2023-06-07 ENCOUNTER — Telehealth: Payer: Self-pay

## 2023-06-07 ENCOUNTER — Ambulatory Visit: Payer: Medicare Other | Admitting: Physician Assistant

## 2023-06-07 DIAGNOSIS — M5441 Lumbago with sciatica, right side: Secondary | ICD-10-CM

## 2023-06-07 DIAGNOSIS — G8929 Other chronic pain: Secondary | ICD-10-CM

## 2023-06-07 DIAGNOSIS — M1711 Unilateral primary osteoarthritis, right knee: Secondary | ICD-10-CM

## 2023-06-07 NOTE — Telephone Encounter (Signed)
Can we get patient approved for Gel injection for R knee

## 2023-06-07 NOTE — Progress Notes (Signed)
Office Visit Note   Patient: [name]           Date of Birth: 1946-05-29           MRN: [id number] Visit Date: 06/07/2023              Requested by: Georgina Quint, MD [address] [place],  [place] [address] PCP: Georgina Quint, MD   Assessment & Plan: Visit Diagnoses:  1. Chronic right-sided low back pain with right-sided sciatica   2. Primary osteoarthritis of right knee     Plan: Impression is chronic right-sided low back pain and right lower extremity radiculopathy and right knee osteoarthritis.  In regards to his back, ESI is pending.  They will be in touch with the patient on Monday to schedule this.  In regards to his right knee, he wishes to get approval for viscosupplementation so we will go ahead and submit for this.  He will follow-up once approved.  Call with concerns or questions.  Follow-Up Instructions: Return for once approved for visco inj.   Orders:  No orders of the defined types were placed in this encounter.  No orders of the defined types were placed in this encounter.     Procedures: No procedures performed   Clinical Data: No additional findings.   Subjective: Chief Complaint  Patient presents with   Right Shoulder - Pain    HPI patient is a 77 year old gentleman who comes in today with right knee pain and right low back pain with right lower extremity radiculopathy.  In regards to his right knee, he has a history of osteoarthritis.  The pain he has is to the entire knee and is worse with activity.  He has had cortisone and viscosupplementation injections in the past.  He notes he has not had any relief with cortisone injections.  He does tell me that he recently underwent viscosupplementation injection at the [place] about 3 months ago.  This provided minimal to no relief.  In regards to his back, he has had chronic pain in the right side radiating down the back to the right leg.  He was seen by Ellin Goodie last week where  he was started on a steroid pack.  He notes temporary relief.  Referral was sent out for repeat ESI.  Currently denies any red flag symptoms.  Review of Systems as detailed in HPI.  All others reviewed and are negative.   Objective: Vital Signs: There were no vitals taken for this visit.  Physical Exam well-developed well-nourished gentleman in no acute distress.  Alert and oriented x 3.  Ortho Exam right knee exam: No effusion.  Range of motion 0 to 120 degrees.  Medial and lateral joint tenderness.  He is neurovascular intact distally.  Lumbar spine exam is unchanged  Specialty Comments:  No specialty comments available.  Imaging: No new imaging   PMFS History: Patient Active Problem List   Diagnosis Date Noted   Constipation 01/31/2023   Chronic kidney disease, stage 3a (HCC) 01/30/2023   Chest pain, rule out acute myocardial infarction 01/29/2023   Carotid artery disease (HCC) 01/29/2023   OSA on CPAP 09/08/2022   Solid nodule of lung greater than 8 mm in diameter 08/16/2022   Acute sinusitis 03/24/2022   Chronic hepatitis C without hepatic coma (HCC) 01/26/2022   Atherosclerosis of aorta (HCC) 06/16/2021   Alcoholic cirrhosis of liver without ascites (HCC) 06/16/2021   Calculus of gallbladder without cholecystitis without obstruction  06/16/2021   Chronic diastolic heart failure (HCC) 12/11/2020   Alcohol abuse with alcohol-induced mood disorder (HCC) 06/16/2019   PTSD (post-traumatic stress disorder) 06/16/2019   Thrombocytopenia (HCC) 02/24/2019   Chronic alcohol abuse 12/09/2015   Coronary artery disease involving coronary bypass graft of native heart without angina pectoris 08/07/2014   Essential hypertension 08/07/2014   Dyslipidemia 08/07/2014   Past Medical History:  Diagnosis Date   Anginal pain (HCC) 01/2005   Anxiety    Arthritis    "knees"   Chronic lower back pain    Colon polyp    Coronary artery disease    Depression    Hepatitis    "don't know  what kind; they treated me for it; was a long time ago" (12/11/2018)   High cholesterol    History of bronchitis    "used to get it alot" (08/25/2012)   Hypertension    Panic attacks    PTSD (post-traumatic stress disorder)    "have been treated in the past" (08/25/2012)    Family History  Problem Relation Age of Onset   Diabetes Mother    Heart disease Father    Sleep apnea Sister    Cancer Brother    Colon cancer Neg Hx    Liver cancer Neg Hx    Pancreatic cancer Neg Hx    Esophageal cancer Neg Hx    Stomach cancer Neg Hx     Past Surgical History:  Procedure Laterality Date   CARDIAC CATHETERIZATION  01/2005   CORONARY ARTERY BYPASS GRAFT  01/2005   CABG X3   Shrapnel Left 1969   LLE; left lateral thumb (required grafting)   VARICOSE VEIN SURGERY  1980's   LLE   Social History   Occupational History   Not on file  Tobacco Use   Smoking status: Former    Packs/day: 2.00    Years: 5.00    Additional pack years: 0.00    Total pack years: 10.00    Types: Cigarettes   Smokeless tobacco: Never   Tobacco comments:    08/25/2012 "quit smoking cigarettes 40 yr ago"  Vaping Use   Vaping Use: Never used  Substance and Sexual Activity   Alcohol use: Not Currently    Alcohol/week: 60.0 standard drinks of alcohol    Types: 60 Standard drinks or equivalent per week    Comment: 12/11/2018 "~ 1 pint of vodka/day"   Drug use: Not Currently    Types: Marijuana   Sexual activity: Yes

## 2023-06-08 DIAGNOSIS — G4733 Obstructive sleep apnea (adult) (pediatric): Secondary | ICD-10-CM | POA: Diagnosis not present

## 2023-06-10 NOTE — Telephone Encounter (Signed)
VOB submitted for Durolane, right knee.  

## 2023-06-15 DIAGNOSIS — G4733 Obstructive sleep apnea (adult) (pediatric): Secondary | ICD-10-CM | POA: Diagnosis not present

## 2023-06-20 ENCOUNTER — Encounter: Payer: Medicare Other | Admitting: Physical Medicine and Rehabilitation

## 2023-06-27 ENCOUNTER — Telehealth: Payer: Self-pay | Admitting: Physical Medicine and Rehabilitation

## 2023-06-27 ENCOUNTER — Encounter: Payer: Medicare Other | Admitting: Physical Medicine and Rehabilitation

## 2023-06-27 NOTE — Telephone Encounter (Signed)
Spoke with patient and rescheduled for 07/12/23

## 2023-06-27 NOTE — Telephone Encounter (Signed)
Patient called advised he has a fever and throwing up. Patient asked for a call back to reschedule.  The number to contact patient is [phone]   or text patient if he's sleep

## 2023-07-12 ENCOUNTER — Other Ambulatory Visit: Payer: Self-pay

## 2023-07-12 ENCOUNTER — Ambulatory Visit: Payer: Medicare Other | Admitting: Physical Medicine and Rehabilitation

## 2023-07-12 VITALS — BP 155/74 | HR 69

## 2023-07-12 DIAGNOSIS — M5416 Radiculopathy, lumbar region: Secondary | ICD-10-CM | POA: Diagnosis not present

## 2023-07-12 MED ORDER — METHYLPREDNISOLONE ACETATE 80 MG/ML IJ SUSP
80.0000 mg | Freq: Once | INTRAMUSCULAR | Status: AC
  Administered 2023-07-12: 80 mg

## 2023-07-12 NOTE — Progress Notes (Signed)
Functional Pain Scale - descriptive words and definitions  Distracting (5)    Aware of pain/able to complete some ADL's but limited by pain/sleep is affected and active distractions are only slightly useful. Moderate range order  Average Pain 5   +Driver, -BT, -Dye Allergies.  Lower back pain on right side that radiates into the right leg to the knee

## 2023-07-12 NOTE — Patient Instructions (Signed)

## 2023-07-20 NOTE — Progress Notes (Signed)
[name] - 77 y.o. male MRN [id number]  Date of birth: 1946-09-01  Office Visit Note: Visit Date: 07/12/2023 PCP: Georgina Quint, MD Referred by: Georgina Quint, *  Subjective: Chief Complaint  Patient presents with   Lower Back - Pain   HPI:  [name] is a 77 y.o. male who comes in today at the request of Ellin Goodie, FNP and Tessa Lerner, PA-C for planned Right L5-S1 Lumbar Interlaminar epidural steroid injection with fluoroscopic guidance.  The patient has failed conservative care including home exercise, medications, time and activity modification.  This injection will be diagnostic and hopefully therapeutic.  Please see requesting physician notes for further details and justification.    ROS Otherwise per HPI.  Assessment & Plan: Visit Diagnoses:    ICD-10-CM   1. Lumbar radiculopathy  M54.16 XR C-ARM NO REPORT    Epidural Steroid injection    methylPREDNISolone acetate (DEPO-MEDROL) injection 80 mg      Plan: No additional findings.   Meds & Orders:  Meds ordered this encounter  Medications   methylPREDNISolone acetate (DEPO-MEDROL) injection 80 mg    Orders Placed This Encounter  Procedures   XR C-ARM NO REPORT   Epidural Steroid injection    Follow-up: Return if symptoms worsen or fail to improve.   Procedures: No procedures performed  Lumbar Epidural Steroid Injection - Interlaminar Approach with Fluoroscopic Guidance  Patient: [name]      Date of Birth: 1946/09/26 MRN: [id number] PCP: Georgina Quint, MD      Visit Date: 07/12/2023   Universal Protocol:     Consent Given By: the patient  Position: PRONE  Additional Comments: Vital signs were monitored before and after the procedure. Patient was prepped and draped in the usual sterile fashion. The correct patient, procedure, and site was verified.   Injection Procedure Details:   Procedure diagnoses: Lumbar radiculopathy [M54.16]   Meds  Administered:  Meds ordered this encounter  Medications   methylPREDNISolone acetate (DEPO-MEDROL) injection 80 mg     Laterality: Right  Location/Site:  L5-S1  Needle: 3.5 in., 20 ga. Tuohy  Needle Placement: Paramedian epidural  Findings:   -Comments: Excellent flow of contrast into the epidural space.  Procedure Details: Using a paramedian approach from the side mentioned above, the region overlying the inferior lamina was localized under fluoroscopic visualization and the soft tissues overlying this structure were infiltrated with 4 ml. of 1% Lidocaine without Epinephrine. The Tuohy needle was inserted into the epidural space using a paramedian approach.   The epidural space was localized using loss of resistance along with counter oblique bi-planar fluoroscopic views.  After negative aspirate for air, blood, and CSF, a 2 ml. volume of Isovue-250 was injected into the epidural space and the flow of contrast was observed. Radiographs were obtained for documentation purposes.    The injectate was administered into the level noted above.   Additional Comments:  The patient tolerated the procedure well Dressing: 2 x 2 sterile gauze and Band-Aid    Post-procedure details: Patient was observed during the procedure. Post-procedure instructions were reviewed.  Patient left the clinic in stable condition.   Clinical History: No specialty comments available.     Objective:  VS:  HT:    WT:   BMI:     BP:(!) 155/74  HR:69bpm  TEMP: ( )  RESP:  Physical Exam Vitals and nursing note reviewed.  Constitutional:      General: He is not  in acute distress.    Appearance: Normal appearance. He is not ill-appearing.  HENT:     Head: Normocephalic and atraumatic.     Right Ear: External ear normal.     Left Ear: External ear normal.     Nose: No congestion.  Eyes:     Extraocular Movements: Extraocular movements intact.  Cardiovascular:     Rate and Rhythm: Normal rate.      Pulses: Normal pulses.  Pulmonary:     Effort: Pulmonary effort is normal. No respiratory distress.  Abdominal:     General: There is no distension.     Palpations: Abdomen is soft.  Musculoskeletal:        General: No tenderness or signs of injury.     Cervical back: Neck supple.     Right lower leg: No edema.     Left lower leg: No edema.     Comments: Patient has good distal strength without clonus.  Skin:    Findings: No erythema or rash.  Neurological:     General: No focal deficit present.     Mental Status: He is alert and oriented to person, place, and time.     Sensory: No sensory deficit.     Motor: No weakness or abnormal muscle tone.     Coordination: Coordination normal.  Psychiatric:        Mood and Affect: Mood normal.        Behavior: Behavior normal.      Imaging: No results found.

## 2023-07-20 NOTE — Procedures (Signed)
Lumbar Epidural Steroid Injection - Interlaminar Approach with Fluoroscopic Guidance  Patient: [name]      Date of Birth: [birth year] MRN: [id number] PCP: Georgina Quint, MD      Visit Date: 07/12/2023   Universal Protocol:     Consent Given By: the patient  Position: PRONE  Additional Comments: Vital signs were monitored before and after the procedure. Patient was prepped and draped in the usual sterile fashion. The correct patient, procedure, and site was verified.   Injection Procedure Details:   Procedure diagnoses: Lumbar radiculopathy [M54.16]   Meds Administered:  Meds ordered this encounter  Medications   methylPREDNISolone acetate (DEPO-MEDROL) injection 80 mg     Laterality: Right  Location/Site:  L5-S1  Needle: 3.5 in., 20 ga. Tuohy  Needle Placement: Paramedian epidural  Findings:   -Comments: Excellent flow of contrast into the epidural space.  Procedure Details: Using a paramedian approach from the side mentioned above, the region overlying the inferior lamina was localized under fluoroscopic visualization and the soft tissues overlying this structure were infiltrated with 4 ml. of 1% Lidocaine without Epinephrine. The Tuohy needle was inserted into the epidural space using a paramedian approach.   The epidural space was localized using loss of resistance along with counter oblique bi-planar fluoroscopic views.  After negative aspirate for air, blood, and CSF, a 2 ml. volume of Isovue-250 was injected into the epidural space and the flow of contrast was observed. Radiographs were obtained for documentation purposes.    The injectate was administered into the level noted above.   Additional Comments:  The patient tolerated the procedure well Dressing: 2 x 2 sterile gauze and Band-Aid    Post-procedure details: Patient was observed during the procedure. Post-procedure instructions were reviewed.  Patient left the clinic in stable  condition.

## 2023-09-30 DIAGNOSIS — Z951 Presence of aortocoronary bypass graft: Secondary | ICD-10-CM | POA: Diagnosis not present

## 2023-09-30 DIAGNOSIS — R002 Palpitations: Secondary | ICD-10-CM | POA: Diagnosis not present

## 2023-09-30 DIAGNOSIS — I2510 Atherosclerotic heart disease of native coronary artery without angina pectoris: Secondary | ICD-10-CM | POA: Diagnosis not present

## 2023-09-30 DIAGNOSIS — R001 Bradycardia, unspecified: Secondary | ICD-10-CM | POA: Diagnosis not present

## 2023-09-30 DIAGNOSIS — E785 Hyperlipidemia, unspecified: Secondary | ICD-10-CM | POA: Diagnosis not present

## 2023-09-30 DIAGNOSIS — I10 Essential (primary) hypertension: Secondary | ICD-10-CM | POA: Diagnosis not present

## 2023-09-30 DIAGNOSIS — I509 Heart failure, unspecified: Secondary | ICD-10-CM | POA: Diagnosis not present

## 2023-10-01 DIAGNOSIS — S0990XA Unspecified injury of head, initial encounter: Secondary | ICD-10-CM | POA: Diagnosis not present

## 2023-10-01 DIAGNOSIS — M542 Cervicalgia: Secondary | ICD-10-CM | POA: Diagnosis not present

## 2023-10-01 DIAGNOSIS — R519 Headache, unspecified: Secondary | ICD-10-CM | POA: Diagnosis not present

## 2023-10-01 DIAGNOSIS — Z87891 Personal history of nicotine dependence: Secondary | ICD-10-CM | POA: Diagnosis not present

## 2023-10-01 DIAGNOSIS — S060X0A Concussion without loss of consciousness, initial encounter: Secondary | ICD-10-CM | POA: Diagnosis not present

## 2023-10-05 DIAGNOSIS — Z79899 Other long term (current) drug therapy: Secondary | ICD-10-CM | POA: Diagnosis not present

## 2023-10-05 DIAGNOSIS — N1831 Chronic kidney disease, stage 3a: Secondary | ICD-10-CM | POA: Diagnosis not present

## 2023-10-05 DIAGNOSIS — S060X0D Concussion without loss of consciousness, subsequent encounter: Secondary | ICD-10-CM | POA: Diagnosis not present

## 2023-10-05 DIAGNOSIS — G43809 Other migraine, not intractable, without status migrainosus: Secondary | ICD-10-CM | POA: Diagnosis not present

## 2023-10-07 DIAGNOSIS — Z79899 Other long term (current) drug therapy: Secondary | ICD-10-CM | POA: Diagnosis not present

## 2023-10-11 DIAGNOSIS — E785 Hyperlipidemia, unspecified: Secondary | ICD-10-CM | POA: Diagnosis not present

## 2023-10-11 DIAGNOSIS — I2510 Atherosclerotic heart disease of native coronary artery without angina pectoris: Secondary | ICD-10-CM | POA: Diagnosis not present

## 2023-10-11 DIAGNOSIS — R002 Palpitations: Secondary | ICD-10-CM | POA: Diagnosis not present

## 2023-10-11 DIAGNOSIS — I712 Thoracic aortic aneurysm, without rupture, unspecified: Secondary | ICD-10-CM | POA: Diagnosis not present

## 2023-10-11 DIAGNOSIS — Z951 Presence of aortocoronary bypass graft: Secondary | ICD-10-CM | POA: Diagnosis not present

## 2023-10-11 DIAGNOSIS — I471 Supraventricular tachycardia, unspecified: Secondary | ICD-10-CM | POA: Diagnosis not present

## 2023-10-11 DIAGNOSIS — I10 Essential (primary) hypertension: Secondary | ICD-10-CM | POA: Diagnosis not present

## 2023-10-11 DIAGNOSIS — R001 Bradycardia, unspecified: Secondary | ICD-10-CM | POA: Diagnosis not present

## 2023-10-21 DIAGNOSIS — E785 Hyperlipidemia, unspecified: Secondary | ICD-10-CM | POA: Diagnosis not present

## 2023-10-21 DIAGNOSIS — I471 Supraventricular tachycardia, unspecified: Secondary | ICD-10-CM | POA: Diagnosis not present

## 2023-10-21 DIAGNOSIS — I10 Essential (primary) hypertension: Secondary | ICD-10-CM | POA: Diagnosis not present

## 2023-10-21 DIAGNOSIS — R001 Bradycardia, unspecified: Secondary | ICD-10-CM | POA: Diagnosis not present

## 2023-10-21 DIAGNOSIS — R002 Palpitations: Secondary | ICD-10-CM | POA: Diagnosis not present

## 2023-10-21 DIAGNOSIS — I712 Thoracic aortic aneurysm, without rupture, unspecified: Secondary | ICD-10-CM | POA: Diagnosis not present

## 2023-10-21 DIAGNOSIS — Z951 Presence of aortocoronary bypass graft: Secondary | ICD-10-CM | POA: Diagnosis not present

## 2023-10-21 DIAGNOSIS — I2510 Atherosclerotic heart disease of native coronary artery without angina pectoris: Secondary | ICD-10-CM | POA: Diagnosis not present

## 2023-11-09 DIAGNOSIS — M5416 Radiculopathy, lumbar region: Secondary | ICD-10-CM | POA: Diagnosis not present

## 2023-11-09 DIAGNOSIS — M1711 Unilateral primary osteoarthritis, right knee: Secondary | ICD-10-CM | POA: Diagnosis not present

## 2023-11-14 DIAGNOSIS — G4733 Obstructive sleep apnea (adult) (pediatric): Secondary | ICD-10-CM | POA: Diagnosis not present

## 2023-11-21 DIAGNOSIS — M4726 Other spondylosis with radiculopathy, lumbar region: Secondary | ICD-10-CM | POA: Diagnosis not present

## 2023-12-22 ENCOUNTER — Ambulatory Visit
Admission: EM | Admit: 2023-12-22 | Discharge: 2023-12-22 | Disposition: A | Discharge status: Home / Self Care | Payer: No Typology Code available for payment source | Attending: Internal Medicine | Admitting: Internal Medicine

## 2023-12-22 DIAGNOSIS — L03818 Cellulitis of other sites: Secondary | ICD-10-CM | POA: Insufficient documentation

## 2023-12-22 DIAGNOSIS — B3742 Candidal balanitis: Secondary | ICD-10-CM | POA: Diagnosis present

## 2023-12-22 DIAGNOSIS — Z9189 Other specified personal risk factors, not elsewhere classified: Secondary | ICD-10-CM | POA: Diagnosis not present

## 2023-12-22 LAB — POCT URINALYSIS DIP (MANUAL ENTRY)
Bilirubin, UA: NEGATIVE
Blood, UA: NEGATIVE
Glucose, UA: NEGATIVE mg/dL
Ketones, POC UA: NEGATIVE mg/dL
Leukocytes, UA: NEGATIVE
Nitrite, UA: NEGATIVE
Spec Grav, UA: 1.02 (ref 1.010–1.025)
Urobilinogen, UA: 1 U/dL
pH, UA: 7 (ref 5.0–8.0)

## 2023-12-22 MED ORDER — CEPHALEXIN 500 MG PO CAPS: 500.0000 mg | ORAL_CAPSULE | Freq: Two times a day (BID) | ORAL | 0 refills | Status: AC

## 2023-12-22 MED ORDER — CLOTRIMAZOLE 1 % EX CREA: TOPICAL_CREAM | CUTANEOUS | 0 refills | Status: DC

## 2023-12-22 NOTE — Discharge Instructions (Addendum)
Take keflex antibiotic every 12 hours for the next 7 days.  Apply ointment every 12 hours for 7 days after shower by retracting the foreskin and then applying a thin layer of the ointment.  Allow the foreskin to return to normal placement after applying ointment.   STD testing will come back in the next 2-3 days and staff will call if any results are positive.   If you develop any new or worsening symptoms or do not improve in the next 2 to 3 days, please return.  If your symptoms are severe, please go to the emergency room.  Follow-up with your primary care provider for further evaluation and management of your symptoms as well as ongoing wellness visits.  I hope you feel better!

## 2023-12-22 NOTE — ED Triage Notes (Signed)
Patient presents to UC for genital warts on penis and left sided groin pain x 4-5 days ago. States he had unprotected intercourse last week. Requesting STD work-up including blood work.

## 2023-12-22 NOTE — ED Provider Notes (Addendum)
[name]    CSN: [id number] Arrival date & time: 12/22/23  1139      History   Chief Complaint Chief Complaint  Patient presents with   SEXUALLY TRANSMITTED DISEASE    HPI [name] is a 78 y.o. male.   Patient presents to urgent care for evaluation of penile discomfort and lesions to the penis that started approximately 4 to 5 days ago.  He describes the penile lesions as "wart appearing and painful".  Denies history of genital herpes and syphilis.  Denies pain to the testicles.  He is uncircumcised and states penile lesions are to the shaft of the penis and have been draining purulent drainage.  He additionally reports the left inguinal lymph nodes feel swollen.  Lesions are slightly itchy to the penis.  Denies recent fevers, chills, nausea, vomiting, and dizziness.  Reports intermittent dysuria without any penile discharge, abdominal pain, urinary urgency, weak urinary stream, gross hematuria, or flank pain.  Reports recent unprotected sexual activity with new partner and would like to be tested for STDs.  No known exposure to STD.  Denies history of diabetes but reports history of CKD stage III.  Denies recent antibiotic or steroid use in the last 90 days.  He has not attempted use of any over-the-counter medications to help with symptoms PTA.     Past Medical History:  Diagnosis Date   Anginal pain (HCC) 01/2005   Anxiety    Arthritis    "knees"   Chronic lower back pain    Colon polyp    Coronary artery disease    Depression    Hepatitis    "don't know what kind; they treated me for it; was a long time ago" (12/11/2018)   High cholesterol    History of bronchitis    "used to get it alot" (08/25/2012)   Hypertension    Panic attacks    PTSD (post-traumatic stress disorder)    "have been treated in the past" (08/25/2012)    Patient Active Problem List   Diagnosis Date Noted   Constipation 01/31/2023   Chronic kidney disease, stage 3a (HCC)  01/30/2023   Chest pain, rule out acute myocardial infarction 01/29/2023   Carotid artery disease (HCC) 01/29/2023   OSA on CPAP 09/08/2022   Solid nodule of lung greater than 8 mm in diameter 08/16/2022   Acute sinusitis 03/24/2022   Chronic hepatitis C without hepatic coma (HCC) 01/26/2022   Atherosclerosis of aorta (HCC) 06/16/2021   Alcoholic cirrhosis of liver without ascites (HCC) 06/16/2021   Calculus of gallbladder without cholecystitis without obstruction 06/16/2021   Chronic diastolic heart failure (HCC) 12/11/2020   Alcohol abuse with alcohol-induced mood disorder (HCC) 06/16/2019   PTSD (post-traumatic stress disorder) 06/16/2019   Thrombocytopenia (HCC) 02/24/2019   Chronic alcohol abuse 12/09/2015   Coronary artery disease involving coronary bypass graft of native heart without angina pectoris 08/07/2014   Essential hypertension 08/07/2014   Dyslipidemia 08/07/2014    Past Surgical History:  Procedure Laterality Date   CARDIAC CATHETERIZATION  01/2005   CORONARY ARTERY BYPASS GRAFT  01/2005   CABG X3   Shrapnel Left 1969   LLE; left lateral thumb (required grafting)   VARICOSE VEIN SURGERY  1980's   LLE       Home Medications    Prior to Admission medications   Medication Sig Start Date End Date Taking? Authorizing Provider  cephALEXin (KEFLEX) 500 MG capsule Take 1 capsule (500 mg total) by mouth 2 (  two) times daily for 7 days. 12/22/23 12/29/23 Yes Carlisle Beers, FNP  clotrimazole (LOTRIMIN) 1 % cream Apply to affected area 2 times daily 12/22/23  Yes Carlisle Beers, FNP  aspirin EC 81 MG tablet Take 1 tablet (81 mg total) by mouth daily. Swallow whole. 01/03/23   Meriam Sprague, MD  atorvastatin (LIPITOR) 10 MG tablet Take 1 tablet (10 mg total) by mouth daily. 01/31/23 01/31/24  Hollice Espy, MD  escitalopram (LEXAPRO) 5 MG tablet Take 5 mg by mouth as needed (depression/anxiety/PTSD). 11/09/21   [provider]  indapamide (LOZOL)  1.25 MG tablet Take 1 tablet (1.25 mg total) by mouth daily. Patient not taking: Reported on 01/30/2023 12/07/21   Lyn Records, MD  irbesartan (AVAPRO) 300 MG tablet Take 0.5 tablets (150 mg total) by mouth daily. 01/31/23   Hollice Espy, MD  predniSONE (DELTASONE) 50 MG tablet Take 1 tablet (50 mg total) by mouth daily with breakfast. Take until completed. 05/31/23   Juanda Chance, NP  REPATHA SURECLICK 140 MG/ML SOAJ INJECT 140 MG INTO THE SKIN EVERY 14 (FOURTEEN) DAYS. 03/29/22   Lyn Records, MD  sildenafil (VIAGRA) 100 MG tablet Take 100 mg by mouth daily as needed for erectile dysfunction. 06/17/21   [provider]  spironolactone (ALDACTONE) 25 MG tablet Take 1 tablet (25 mg total) by mouth daily. Patient not taking: Reported on 01/24/2023 12/03/21   Lyn Records, MD    Family History Family History  Problem Relation Age of Onset   Diabetes Mother    Heart disease Father    Sleep apnea Sister    Cancer Brother    Colon cancer Neg Hx    Liver cancer Neg Hx    Pancreatic cancer Neg Hx    Esophageal cancer Neg Hx    Stomach cancer Neg Hx     Social History Social History   Tobacco Use   Smoking status: Former    Current packs/day: 2.00    Average packs/day: 2.0 packs/day for 5.0 years (10.0 ttl pk-yrs)    Types: Cigarettes   Smokeless tobacco: Never   Tobacco comments:    08/25/2012 "quit smoking cigarettes 40 yr ago"  Vaping Use   Vaping status: Never Used  Substance Use Topics   Alcohol use: Not Currently    Alcohol/week: 60.0 standard drinks of alcohol    Types: 60 Standard drinks or equivalent per week    Comment: 12/11/2018 "~ 1 pint of vodka/day"   Drug use: Not Currently    Types: Marijuana     Allergies   Codeine and Vicodin [hydrocodone-acetaminophen]   Review of Systems Review of Systems Per HPI  Physical Exam Triage Vital Signs ED Triage Vitals  Encounter Vitals Group     BP 12/22/23 1200 (!) 152/73     Systolic BP Percentile --       Diastolic BP Percentile --      Pulse Rate 12/22/23 1200 62     Resp 12/22/23 1200 16     Temp 12/22/23 1200 97.9 F (36.6 C)     Temp Source 12/22/23 1200 Oral     SpO2 12/22/23 1200 98 %     Weight --      Height --      Head Circumference --      Peak Flow --      Pain Score 12/22/23 1204 7     Pain Loc --      Pain  Education --      Exclude from [employer] Chart --    No data found.  Updated Vital Signs BP (!) 152/73 (BP Location: Right Arm)   Pulse 62   Temp 97.9 F (36.6 C) (Oral)   Resp 16   SpO2 98%   Visual Acuity Right Eye Distance:   Left Eye Distance:   Bilateral Distance:    Right Eye Near:   Left Eye Near:    Bilateral Near:     Physical Exam Vitals and nursing note reviewed. Exam conducted with a chaperone present Printmaker present for entirety of genitourinary exam.).  Constitutional:      Appearance: He is not ill-appearing or toxic-appearing.  HENT:     Head: Normocephalic and atraumatic.     Right Ear: Hearing, tympanic membrane, ear canal and external ear normal.     Left Ear: Hearing, tympanic membrane, ear canal and external ear normal.     Nose: Nose normal.     Mouth/Throat:     Lips: Pink.     Mouth: Mucous membranes are moist.     Pharynx: No posterior oropharyngeal erythema.  Eyes:     General: Lids are normal. Vision grossly intact. Gaze aligned appropriately.     Extraocular Movements: Extraocular movements intact.     Conjunctiva/sclera: Conjunctivae normal.  Pulmonary:     Effort: Pulmonary effort is normal.  Genitourinary:    Penis: Uncircumcised. Lesions present. No phimosis, paraphimosis, hypospadias, erythema, tenderness, discharge or swelling.      Testes: Normal.       Comments: Three 1 to 2 mm abrasion/maculopapular appearing lesions to the penile shaft.  Able to fully retract foreskin without difficulty.  No appreciable penile discharge or ulcerated/wart appearing lesions.  Minimal inguinal lymph node swelling to the  left inguinal area. Musculoskeletal:     Cervical back: Neck supple.  Skin:    General: Skin is warm and dry.     Capillary Refill: Capillary refill takes less than 2 seconds.     Findings: No rash.  Neurological:     General: No focal deficit present.     Mental Status: He is alert and oriented to person, place, and time. Mental status is at baseline.     Cranial Nerves: No dysarthria or facial asymmetry.  Psychiatric:        Mood and Affect: Mood normal.        Speech: Speech normal.        Behavior: Behavior normal.        Thought Content: Thought content normal.        Judgment: Judgment normal.      UC Treatments / Results  Labs (all labs ordered are listed, but only abnormal results are displayed) Labs Reviewed  POCT URINALYSIS DIP (MANUAL ENTRY) - Abnormal; Notable for the following components:      Result Value   Protein Ur, POC trace (*)    All other components within normal limits  RPR  HIV ANTIBODY (ROUTINE TESTING W REFLEX)  CYTOLOGY, (ORAL, ANAL, URETHRAL) ANCILLARY ONLY    EKG   Radiology No results found.  Procedures Procedures (including critical care time)  Medications Ordered in UC Medications - No data to display  Initial Impression / Assessment and Plan / UC Course  I have reviewed the triage vital signs and the nursing notes.  Pertinent labs & imaging results that were available during my care of the patient were reviewed by me and considered in my  medical decision making (see chart for details).   1.  At risk for sexually transmitted disease due to unprotected sex, candidal balanitis, cellulitis of unspecified site Penile lesions suspicious for balanitis. Low suspicion for genital herpes, therefore deferred testing.  Foreskin appears to be mildly swollen as well. Suspect secondary bacterial infection due to itching and irritation from balanitis. Discussed hygiene to prevent balanitis infections in the future. Advised to apply clotrimazole  every 12 hours for the next 7 days to the penile shaft after rinsing, patting dry, and cleaning underneath the foreskin. Keflex antibiotic every 12 hours for the next 7 days ordered to treat secondary bacterial infection. Creatinine clearance is 75, therefore he may have normal dosing (history of CKD stage III). Urinalysis is unremarkable for signs of urinary tract infection.  STI labs pending, will notify patient of positive results and treat accordingly per protocol when labs result.  Patient would like HIV and syphilis testing today.   Patient to avoid sexual intercourse until screening testing comes back.   Education provided regarding safe sexual practices and patient encouraged to use protection to prevent spread of STIs.     Counseled patient on potential for adverse effects with medications prescribed/recommended today, strict ER and return-to-clinic precautions discussed, patient verbalized understanding.    Final Clinical Impressions(s) / UC Diagnoses   Final diagnoses:  At risk for sexually transmitted disease due to unprotected sex  Candidal balanitis  Cellulitis of other specified site     Discharge Instructions      Take keflex antibiotic every 12 hours for the next 7 days.  Apply ointment every 12 hours for 7 days after shower by retracting the foreskin and then applying a thin layer of the ointment.  Allow the foreskin to return to normal placement after applying ointment.   STD testing will come back in the next 2-3 days and staff will call if any results are positive.   If you develop any new or worsening symptoms or do not improve in the next 2 to 3 days, please return.  If your symptoms are severe, please go to the emergency room.  Follow-up with your primary care provider for further evaluation and management of your symptoms as well as ongoing wellness visits.  I hope you feel better!      ED Prescriptions     Medication Sig Dispense Auth. Provider    cephALEXin (KEFLEX) 500 MG capsule Take 1 capsule (500 mg total) by mouth 2 (two) times daily for 7 days. 14 capsule Reita May M, FNP   clotrimazole (LOTRIMIN) 1 % cream Apply to affected area 2 times daily 15 g Carlisle Beers, FNP      PDMP not reviewed this encounter.   Carlisle Beers, FNP 12/22/23 1400    Carlisle Beers, FNP 12/22/23 1400

## 2023-12-23 LAB — RPR: RPR Ser Ql: NONREACTIVE

## 2023-12-23 LAB — HIV ANTIBODY (ROUTINE TESTING W REFLEX): HIV Screen 4th Generation wRfx: NONREACTIVE

## 2023-12-27 LAB — CYTOLOGY, (ORAL, ANAL, URETHRAL) ANCILLARY ONLY
Chlamydia: NEGATIVE
Comment: NEGATIVE
Comment: NEGATIVE
Comment: NORMAL
Neisseria Gonorrhea: NEGATIVE
Trichomonas: NEGATIVE

## 2024-01-09 DIAGNOSIS — G4733 Obstructive sleep apnea (adult) (pediatric): Secondary | ICD-10-CM | POA: Diagnosis not present

## 2024-01-25 ENCOUNTER — Emergency Department (HOSPITAL_BASED_OUTPATIENT_CLINIC_OR_DEPARTMENT_OTHER): Payer: No Typology Code available for payment source

## 2024-01-25 ENCOUNTER — Emergency Department (HOSPITAL_BASED_OUTPATIENT_CLINIC_OR_DEPARTMENT_OTHER)
Admission: EM | Admit: 2024-01-25 | Discharge: 2024-01-25 | Disposition: A | Discharge status: Home / Self Care | Payer: No Typology Code available for payment source | Attending: Emergency Medicine | Admitting: Emergency Medicine

## 2024-01-25 ENCOUNTER — Emergency Department (HOSPITAL_BASED_OUTPATIENT_CLINIC_OR_DEPARTMENT_OTHER): Payer: No Typology Code available for payment source | Admitting: Radiology

## 2024-01-25 ENCOUNTER — Encounter (HOSPITAL_BASED_OUTPATIENT_CLINIC_OR_DEPARTMENT_OTHER): Payer: Self-pay | Admitting: Emergency Medicine

## 2024-01-25 ENCOUNTER — Other Ambulatory Visit: Payer: Self-pay

## 2024-01-25 DIAGNOSIS — M79672 Pain in left foot: Secondary | ICD-10-CM | POA: Diagnosis present

## 2024-01-25 DIAGNOSIS — Z7982 Long term (current) use of aspirin: Secondary | ICD-10-CM | POA: Diagnosis not present

## 2024-01-25 DIAGNOSIS — M109 Gout, unspecified: Secondary | ICD-10-CM | POA: Insufficient documentation

## 2024-01-25 MED ORDER — OXYCODONE HCL 5 MG PO TABS: 5.0000 mg | ORAL_TABLET | Freq: Four times a day (QID) | ORAL | 0 refills | Status: DC | PRN

## 2024-01-25 MED ORDER — OXYCODONE-ACETAMINOPHEN 5-325 MG PO TABS
1.0000 | ORAL_TABLET | Freq: Once | ORAL | Status: AC
  Administered 2024-01-25: 1 via ORAL
  Filled 2024-01-25: qty 1

## 2024-01-25 MED ORDER — PREDNISONE 20 MG PO TABS: ORAL_TABLET | ORAL | 0 refills | Status: DC

## 2024-01-25 NOTE — ED Provider Notes (Signed)
 [place] EMERGENCY DEPARTMENT AT [facility] Provider Note   CSN: [id number] Arrival date & time: 01/25/24  0865     History  Chief Complaint  Patient presents with   Foot Pain    [name] is a 78 y.o. male.  HPI 78 year old male presents with left foot pain.  Patient's pain originally started as a cramp in his calf a couple nights ago.  The calf pain is gone and there is no current swelling to his calf.  However he has pain and swelling to his left foot ever since and is very painful to ambulate.  No fevers, chest pain, shortness of breath.  He has had gout before though that felt different and was in both feet.  Pain is diffuse across his foot.  Home Medications Prior to Admission medications   Medication Sig Start Date End Date Taking? Authorizing Provider  oxyCODONE (ROXICODONE) 5 MG immediate release tablet Take 1 tablet (5 mg total) by mouth every 6 (six) hours as needed for severe pain (pain score 7-10). 01/25/24  Yes Pricilla Loveless, MD  predniSONE (DELTASONE) 20 MG tablet 3 tabs po daily x 3 days, then 2 tabs x 3 days, then 1.5 tabs x 3 days, then 1 tab x 3 days, then 0.5 tabs x 3 days 01/25/24  Yes Pricilla Loveless, MD  aspirin EC 81 MG tablet Take 1 tablet (81 mg total) by mouth daily. Swallow whole. 01/03/23   Meriam Sprague, MD  atorvastatin (LIPITOR) 10 MG tablet Take 1 tablet (10 mg total) by mouth daily. 01/31/23 01/31/24  Hollice Espy, MD  clotrimazole (LOTRIMIN) 1 % cream Apply to affected area 2 times daily 12/22/23   Carlisle Beers, FNP  escitalopram (LEXAPRO) 5 MG tablet Take 5 mg by mouth as needed (depression/anxiety/PTSD). 11/09/21   [provider]  indapamide (LOZOL) 1.25 MG tablet Take 1 tablet (1.25 mg total) by mouth daily. Patient not taking: Reported on 01/30/2023 12/07/21   Lyn Records, MD  irbesartan (AVAPRO) 300 MG tablet Take 0.5 tablets (150 mg total) by mouth daily. 01/31/23   Hollice Espy, MD  REPATHA  SURECLICK 140 MG/ML SOAJ INJECT 140 MG INTO THE SKIN EVERY 14 (FOURTEEN) DAYS. 03/29/22   Lyn Records, MD  sildenafil (VIAGRA) 100 MG tablet Take 100 mg by mouth daily as needed for erectile dysfunction. 06/17/21   [provider]  spironolactone (ALDACTONE) 25 MG tablet Take 1 tablet (25 mg total) by mouth daily. Patient not taking: Reported on 01/24/2023 12/03/21   Lyn Records, MD      Allergies    Codeine and Vicodin [hydrocodone-acetaminophen]    Review of Systems   Review of Systems  Respiratory:  Negative for shortness of breath.   Cardiovascular:  Negative for chest pain.  Musculoskeletal:  Positive for arthralgias.    Physical Exam Updated Vital Signs BP 139/63 (BP Location: Right Arm)   Pulse (!) 53   Temp 97.8 F (36.6 C)   Resp 18   Ht 5\' 10"  (1.778 m)   Wt 112.5 kg   SpO2 99%   BMI 35.58 kg/m  Physical Exam Vitals and nursing note reviewed.  Constitutional:      Appearance: He is well-developed.  HENT:     Head: Normocephalic and atraumatic.  Cardiovascular:     Rate and Rhythm: Normal rate and regular rhythm.     Pulses:          Dorsalis pedis pulses are 2+  on the left side.  Pulmonary:     Effort: Pulmonary effort is normal.  Musculoskeletal:     Left upper leg: No tenderness.     Left lower leg: No swelling or tenderness.     Left ankle: No swelling. No tenderness. Normal range of motion.     Left foot: Tenderness present.     Comments: There seems to be significant tenderness with minimal movement of left great toe. No redness. Perhaps a small area of ecchymosis to dorsal left foot. Patient has no pain with movement of other toes, or with ROM of ankle. No tenderness to proximal foot.  Skin:    General: Skin is warm and dry.  Neurological:     Mental Status: He is alert.     ED Results / Procedures / Treatments   Labs (all labs ordered are listed, but only abnormal results are displayed) Labs Reviewed - No data to  display  EKG None  Radiology US Venous Img Lower Unilateral Left Result Date: 01/25/2024 CLINICAL DATA:  foot swelling of the left foot for 3 days. Edema and pain. Varicose veins. EXAM: Left LOWER EXTREMITY VENOUS DOPPLER ULTRASOUND TECHNIQUE: Gray-scale sonography with compression, as well as color and duplex ultrasound, were performed to evaluate the deep venous system(s) from the level of the common femoral vein through the popliteal and proximal calf veins. COMPARISON:  None Available. FINDINGS: VENOUS Normal compressibility of the common femoral, superficial femoral, and popliteal veins, as well as the visualized calf veins. Visualized portions of profunda femoral vein and great saphenous vein unremarkable. No filling defects to suggest DVT on grayscale or color Doppler imaging. Doppler waveforms show normal direction of venous flow, normal respiratory plasticity and response to augmentation. Limited views of the contralateral common femoral vein are unremarkable. OTHER None. Limitations: none IMPRESSION: No deep venous thrombosis of the left lower extremity. Electronically Signed   By: Tish Frederickson M.D.   On: 01/25/2024 10:57   DG Foot Complete Left Result Date: 01/25/2024 CLINICAL DATA:  Left foot pain. EXAM: LEFT FOOT - COMPLETE 3+ VIEW COMPARISON:  None Available. FINDINGS: There is no evidence of acute fracture or dislocation. Mild-to-moderate degenerative changes of the first MTP joint with joint space narrowing and osteophytosis. Diffuse interphalangeal joint space narrowing. Mild degenerative changes of the midfoot. Calcaneal enthesopathy at the origin of the central cord of the plantar fascia and insertion of the Achilles tendon. Vascular calcifications are present. Nonspecific generalized subcutaneous edema. IMPRESSION: 1. No acute osseous abnormality. 2. Mild-to-moderate degenerative changes of the first MTP joint. 3. Calcaneal enthesopathy. Electronically Signed   By: Hart Robinsons  M.D.   On: 01/25/2024 09:06    Procedures Procedures    Medications Ordered in ED Medications  oxyCODONE-acetaminophen (PERCOCET/ROXICET) 5-325 MG per tablet 1 tablet (1 tablet Oral Given 01/25/24 1610)    ED Course/ Medical Decision Making/ A&P                                 Medical Decision Making Amount and/or Complexity of Data Reviewed Radiology: ordered and independent interpretation performed.    Details: No DVT.  No fracture  Risk Prescription drug management.   Patient presents with what is probably gout of his left foot.  He was concerned about DVT but his ultrasound is negative.  Most of the pain does seem to be in his first toe.  Doubt septic joint.  He is otherwise well-appearing.  He is feeling better after some oxycodone and will be treated with a brief course of oxycodone as well as steroids.  Will discharge home with return precautions.        Final Clinical Impression(s) / ED Diagnoses Final diagnoses:  Acute gout involving toe of left foot, unspecified cause    Rx / DC Orders ED Discharge Orders          Ordered    oxyCODONE (ROXICODONE) 5 MG immediate release tablet  Every 6 hours PRN        01/25/24 1113    predniSONE (DELTASONE) 20 MG tablet        01/25/24 1113              Pricilla Loveless, MD 01/25/24 1127

## 2024-01-25 NOTE — Discharge Instructions (Addendum)
 We are treating you for likely gout in your foot/toe.  Follow-up with your primary care physician.  We are giving you oxycodone to help with severe pain.  Do not drive or operate heavy machinery or drink alcohol with this medication.  We are also putting you on steroids, complete the full course.   You develop new or worsening pain, inability to walk, redness to your foot or leg, fever, or any other new/concerning symptoms then return to the ER or call 911.

## 2024-01-25 NOTE — ED Notes (Signed)
 Discharge paperwork given and verbally understood.

## 2024-01-25 NOTE — ED Triage Notes (Signed)
 Pt c/o L foot pain and swelling increasing since yest. Pt denies trauma to the foot. Pt denies PMH DVT/PE. Pt states he has had gout in the past but this feels worse.

## 2024-03-30 ENCOUNTER — Telehealth: Payer: Self-pay | Admitting: Orthopaedic Surgery

## 2024-03-30 NOTE — Telephone Encounter (Signed)
 Patient called. He would like gel injections in his knees.

## 2024-03-30 NOTE — Telephone Encounter (Signed)
 Patient called and said he wanted to know if his authorization was approved for the gel shot. He stated that the nurse ordered it 6 months ago. He haven't seen [facility] since 11/2021. CB#[phone]

## 2024-03-30 NOTE — Telephone Encounter (Signed)
 Looks like it was submitted last year, but never heard back. Any update on gel approval?

## 2024-04-02 ENCOUNTER — Telehealth: Payer: Self-pay | Admitting: Orthopaedic Surgery

## 2024-04-02 ENCOUNTER — Other Ambulatory Visit: Payer: Self-pay

## 2024-04-02 DIAGNOSIS — M1711 Unilateral primary osteoarthritis, right knee: Secondary | ICD-10-CM

## 2024-04-02 NOTE — Telephone Encounter (Signed)
 Previously submitted by Darrell Else.  Appt has been scheduled  for gel injection.

## 2024-04-02 NOTE — Telephone Encounter (Signed)
 [name] spoke with patient this morning.  He came into the office when no one answered at [name]'s line.  I have resubmitted his BV with Durolane.  It is likely the same but we will verify.  [name] will work him in tomorrow for the injection.  I will retrieve the BV once completed, and call patient to advise.

## 2024-04-02 NOTE — Telephone Encounter (Signed)
 VOB has been submitted for Durolane, right knee per Darrell Else.

## 2024-04-02 NOTE — Telephone Encounter (Signed)
 Patient called. I did let him know that the insurance has not approved his gel injection yet. It will be resubmitted. He asked for a supervisor. So I transferred him to [place].

## 2024-04-03 ENCOUNTER — Ambulatory Visit: Admitting: Physician Assistant

## 2024-04-03 ENCOUNTER — Ambulatory Visit
Admission: RE | Admit: 2024-04-03 | Discharge: 2024-04-03 | Disposition: A | Discharge status: Home / Self Care | Source: Ambulatory Visit | Attending: Family Medicine | Admitting: Family Medicine

## 2024-04-03 ENCOUNTER — Ambulatory Visit: Payer: Self-pay

## 2024-04-03 ENCOUNTER — Telehealth: Payer: Self-pay | Admitting: Orthopaedic Surgery

## 2024-04-03 ENCOUNTER — Other Ambulatory Visit: Payer: Self-pay

## 2024-04-03 VITALS — BP 175/73 | HR 58 | Temp 98.8°F | Resp 18

## 2024-04-03 DIAGNOSIS — M25561 Pain in right knee: Secondary | ICD-10-CM

## 2024-04-03 DIAGNOSIS — M1711 Unilateral primary osteoarthritis, right knee: Secondary | ICD-10-CM

## 2024-04-03 MED ORDER — SODIUM HYALURONATE 60 MG/3ML IX PRSY
60.0000 mg | PREFILLED_SYRINGE | INTRA_ARTICULAR | Status: AC | PRN
  Administered 2024-04-03: 60 mg via INTRA_ARTICULAR

## 2024-04-03 MED ORDER — ACETAMINOPHEN 325 MG PO TABS
650.0000 mg | ORAL_TABLET | Freq: Once | ORAL | Status: AC
  Administered 2024-04-03: 650 mg via ORAL

## 2024-04-03 MED ORDER — BUPIVACAINE HCL 0.25 % IJ SOLN
2.0000 mL | INTRAMUSCULAR | Status: AC | PRN
  Administered 2024-04-03: 2 mL via INTRA_ARTICULAR

## 2024-04-03 MED ORDER — LIDOCAINE HCL 1 % IJ SOLN
2.0000 mL | INTRAMUSCULAR | Status: AC | PRN
  Administered 2024-04-03: 2 mL

## 2024-04-03 NOTE — Telephone Encounter (Signed)
  Chief Complaint: knee pain and swelling  Symptoms: pain, swelling Frequency: onset today Pertinent Negatives: Patient denies  Disposition: [] ED /[] Urgent Care (no appt availability in office) / [] Appointment(In office/virtual)/ []  [facility]/ [] Home Care/ [] Refused Recommended Disposition /[] [facility]/ []  Follow-up with PCP Additional Notes:  Received injection in his right knee this morning at [facility], treatment for OA. Calling because he is now having severe swelling and excruciating pain to right knee. He has tried otc pain reliever but it is not helping. States he will not be able to manage without some sort of pain relief, asking this writing to call in prescription. Advised urgent care evaluation for assessment and treatment. Patient in agreement with plan and will proceed to [facility] urgent care.    Copied from CRM [id number]. Topic: Clinical - Red Word Triage >> Apr 03, 2024  5:29 PM Everette C wrote: [facility] that prompted transfer to Nurse Triage: The patient received an injection in their right knee today and shares that left their leg is now swelling and becoming very uncomfortable Reason for Disposition  [1] SEVERE pain (e.g., excruciating, unable to walk) AND [2] not improved after 2 hours of pain medicine  Protocols used: Knee Swelling-A-AH

## 2024-04-03 NOTE — ED Triage Notes (Signed)
 Pt here for increased right knee pain after having injection in knee today; pt sts feels like it is swollen

## 2024-04-03 NOTE — Telephone Encounter (Signed)
Patient appt was scheduled

## 2024-04-03 NOTE — Discharge Instructions (Addendum)
 You may take Tylenol  650 mg 4 times daily as needed for pain.  Contact Ortho office on tomorrow to follow-up on request for pain medication.  Recommend wearing Ace wrap to help reduce swelling also help with pain until you can follow-up with the orthopedic doctor tomorrow.  You may remove Ace wrap with bathing and while asleep however if you are still experiencing any swelling I recommend rewrapping tomorrow.

## 2024-04-03 NOTE — ED Provider Notes (Signed)
 [facility]    CSN: [id number] Arrival date & time: 04/03/24  1759      History   Chief Complaint Chief Complaint  Patient presents with   Leg Swelling    Entered by patient    HPI [name] is a 78 y.o. male.   HPI Patient here with right knee swelling and pain following a joint injection he received at the orthopedic office this morning. Patient reports he is feeling pain and swelling involving the knee following the injection. No redness or rash present. Patient has requested oxycodone  from orthopedic provider however the provider has left for the day and he is here requesting oxycodone  for pain management Chart review patient has called orthopedic doctor and doctor had left for the day prescription medication request has been sent to the provider.  Past Medical History:  Diagnosis Date   Anginal pain (HCC) 01/2005   Anxiety    Arthritis    "knees"   Chronic lower back pain    Colon polyp    Coronary artery disease    Depression    Hepatitis    "don't know what kind; they treated me for it; was a long time ago" (12/11/2018)   High cholesterol    History of bronchitis    "used to get it alot" (08/25/2012)   Hypertension    Panic attacks    PTSD (post-traumatic stress disorder)    "have been treated in the past" (08/25/2012)    Patient Active Problem List   Diagnosis Date Noted   Constipation 01/31/2023   Chronic kidney disease, stage 3a (HCC) 01/30/2023   Chest pain, rule out acute myocardial infarction 01/29/2023   Carotid artery disease (HCC) 01/29/2023   OSA on CPAP 09/08/2022   Solid nodule of lung greater than 8 mm in diameter 08/16/2022   Acute sinusitis 03/24/2022   Chronic hepatitis C without hepatic coma (HCC) 01/26/2022   Atherosclerosis of aorta (HCC) 06/16/2021   Alcoholic cirrhosis of liver without ascites (HCC) 06/16/2021   Calculus of gallbladder without cholecystitis without obstruction 06/16/2021   Chronic diastolic heart  failure (HCC) 40/98/1191   Alcohol abuse with alcohol-induced mood disorder (HCC) 06/16/2019   PTSD (post-traumatic stress disorder) 06/16/2019   Thrombocytopenia (HCC) 02/24/2019   Chronic alcohol abuse 12/09/2015   Coronary artery disease involving coronary bypass graft of native heart without angina pectoris 08/07/2014   Essential hypertension 08/07/2014   Dyslipidemia 08/07/2014    Past Surgical History:  Procedure Laterality Date   CARDIAC CATHETERIZATION  01/2005   CORONARY ARTERY BYPASS GRAFT  01/2005   CABG X3   Shrapnel Left 1969   LLE; left lateral thumb (required grafting)   VARICOSE VEIN SURGERY  1980's   LLE       Home Medications    Prior to Admission medications   Medication Sig Start Date End Date Taking? Authorizing Provider  aspirin  EC 81 MG tablet Take 1 tablet (81 mg total) by mouth daily. Swallow whole. 01/03/23   Sonny Dust, MD  atorvastatin  (LIPITOR) 10 MG tablet Take 1 tablet (10 mg total) by mouth daily. 01/31/23 01/31/24  Krishnan, Sendil K, MD  clotrimazole  (LOTRIMIN ) 1 % cream Apply to affected area 2 times daily 12/22/23   Starlene Eaton, FNP  escitalopram  (LEXAPRO ) 5 MG tablet Take 5 mg by mouth as needed (depression/anxiety/PTSD). 11/09/21   [provider]  indapamide  (LOZOL ) 1.25 MG tablet Take 1 tablet (1.25 mg total) by mouth daily. Patient not taking: Reported  on 01/30/2023 12/07/21   Arty Binning, MD  irbesartan  (AVAPRO ) 300 MG tablet Take 0.5 tablets (150 mg total) by mouth daily. 01/31/23   Krishnan, Sendil K, MD  oxyCODONE  (ROXICODONE ) 5 MG immediate release tablet Take 1 tablet (5 mg total) by mouth every 6 (six) hours as needed for severe pain (pain score 7-10). Patient not taking: Reported on 04/03/2024 01/25/24   Jerilynn Montenegro, MD  predniSONE  (DELTASONE ) 20 MG tablet 3 tabs po daily x 3 days, then 2 tabs x 3 days, then 1.5 tabs x 3 days, then 1 tab x 3 days, then 0.5 tabs x 3 days Patient not taking: Reported on 04/03/2024  01/25/24   Jerilynn Montenegro, MD  REPATHA  SURECLICK 140 MG/ML SOAJ INJECT 140 MG INTO THE SKIN EVERY 14 (FOURTEEN) DAYS. 03/29/22   Arty Binning, MD  sildenafil (VIAGRA) 100 MG tablet Take 100 mg by mouth daily as needed for erectile dysfunction. 06/17/21   [provider]  spironolactone  (ALDACTONE ) 25 MG tablet Take 1 tablet (25 mg total) by mouth daily. Patient not taking: Reported on 01/24/2023 12/03/21   Arty Binning, MD    Family History Family History  Problem Relation Age of Onset   Diabetes Mother    Heart disease Father    Sleep apnea Sister    Cancer Brother    Colon cancer Neg Hx    Liver cancer Neg Hx    Pancreatic cancer Neg Hx    Esophageal cancer Neg Hx    Stomach cancer Neg Hx     Social History Social History   Tobacco Use   Smoking status: Former    Current packs/day: 2.00    Average packs/day: 2.0 packs/day for 5.0 years (10.0 ttl pk-yrs)    Types: Cigarettes   Smokeless tobacco: Never   Tobacco comments:    08/25/2012 "quit smoking cigarettes 40 yr ago"  Vaping Use   Vaping status: Never Used  Substance Use Topics   Alcohol use: Not Currently    Alcohol/week: 60.0 standard drinks of alcohol    Types: 60 Standard drinks or equivalent per week    Comment: 12/11/2018 "~ 1 pint of vodka/day"   Drug use: Not Currently    Types: Marijuana     Allergies   Codeine and Vicodin [hydrocodone -acetaminophen ]   Review of Systems Review of Systems Pertinent negatives listed in HPI  Physical Exam Triage Vital Signs ED Triage Vitals  Encounter Vitals Group     BP 04/03/24 1829 (!) 175/73     Systolic BP Percentile --      Diastolic BP Percentile --      Pulse Rate 04/03/24 1829 (!) 58     Resp 04/03/24 1829 18     Temp 04/03/24 1829 98.8 F (37.1 C)     Temp Source 04/03/24 1829 Oral     SpO2 04/03/24 1829 94 %     Weight --      Height --      Head Circumference --      Peak Flow --      Pain Score 04/03/24 1830 10     Pain Loc --      Pain  Education --      Exclude from Growth Chart --    No data found.  Updated Vital Signs BP (!) 175/73 (BP Location: Left Arm)   Pulse (!) 58   Temp 98.8 F (37.1 C) (Oral)   Resp 18   SpO2 94%  Visual Acuity Right Eye Distance:   Left Eye Distance:   Bilateral Distance:    Right Eye Near:   Left Eye Near:    Bilateral Near:     Physical Exam Vitals reviewed.  Constitutional:      Appearance: Normal appearance.  HENT:     Head: Normocephalic and atraumatic.  Eyes:     Extraocular Movements: Extraocular movements intact.     Pupils: Pupils are equal, round, and reactive to light.  Cardiovascular:     Rate and Rhythm: Normal rate.  Pulmonary:     Effort: Pulmonary effort is normal.  Musculoskeletal:     Right knee: No swelling. Decreased range of motion. Tenderness present.     Left knee: Normal.  Neurological:     General: No focal deficit present.     Mental Status: He is alert.      UC Treatments / Results  Labs (all labs ordered are listed, but only abnormal results are displayed) Labs Reviewed - No data to display  EKG   Radiology No results found.  Procedures Procedures (including critical care time)  Medications Ordered in UC Medications  acetaminophen  (TYLENOL ) tablet 650 mg (650 mg Oral Given 04/03/24 1902)    Initial Impression / Assessment and Plan / UC Course  I have reviewed the triage vital signs and the nursing notes.  Pertinent labs & imaging results that were available during my care of the patient were reviewed by me and considered in my medical decision making (see chart for details).    Acute right knee pain status post joint injection today.  Patient encouraged to follow-up with orthopedic provider in the morning regarding pain medication.  Patient educated that joint injections can temporarily cause pain however as the medication subsides in the joint it will cause inflammation and swelling to resolve.  Today we will place in an  Ace wrap to reduce swelling and tenderness.  Patient received a one-time dose of Tylenol  while here in the office.  Offered a muscle relaxer for pain management however patient declined.  Follow-up with orthopedic provider on tomorrow. Final Clinical Impressions(s) / UC Diagnoses   Final diagnoses:  Acute pain of right knee     Discharge Instructions      You may take Tylenol  650 mg 4 times daily as needed for pain.  Contact Ortho office on tomorrow to follow-up on request for pain medication.  Recommend wearing Ace wrap to help reduce swelling also help with pain until you can follow-up with the orthopedic doctor tomorrow.  You may remove Ace wrap with bathing and while asleep however if you are still experiencing any swelling I recommend rewrapping tomorrow.     ED Prescriptions   None    PDMP not reviewed this encounter.   Buena Carmine, NP 04/03/24 1904

## 2024-04-03 NOTE — Telephone Encounter (Signed)
 Pt called stating he had a gel in injection and having pains. Explained to pt that it's 5 pm and that our doctors are gone and they will see his message tomorrow to request pain medication. Please call pt about this matter at [phone].

## 2024-04-03 NOTE — Progress Notes (Signed)
   Procedure Note  Patient: [name]             Date of Birth: 1946-08-15           MRN: [id number]             Visit Date: 04/03/2024  Procedures: Visit Diagnoses:  1. Primary osteoarthritis of right knee     Large Joint Inj: R knee on 04/03/2024 9:13 AM Indications: pain Details: 22 G needle, anterolateral approach Medications: 2 mL lidocaine  1 %; 2 mL bupivacaine  0.25 %; 60 mg Sodium Hyaluronate 60 MG/3ML

## 2024-04-04 ENCOUNTER — Ambulatory Visit: Payer: Self-pay

## 2024-04-04 ENCOUNTER — Telehealth: Payer: Self-pay

## 2024-04-04 ENCOUNTER — Other Ambulatory Visit: Payer: Self-pay | Admitting: Physician Assistant

## 2024-04-04 MED ORDER — OXYCODONE HCL 5 MG PO TABS: 5.0000 mg | ORAL_TABLET | Freq: Two times a day (BID) | ORAL | 0 refills | Status: DC | PRN

## 2024-04-04 NOTE — Telephone Encounter (Signed)
 Copied from CRM [id number]. Topic: Clinical - Red Word Triage >> Apr 04, 2024  2:25 PM [name] wrote: Red Word that prompted transfer to Nurse Triage: Pt is experiencing severe pain and swelling   Patient called in regards to the Oxycodone  he was prescribed for his knee pain. Patient had concerns about safety of taking the medication. I advised that if he has questions or concerns about the medication he should follow up with the provider who prescribed it for him. Patient verbalized understanding. He states he has reached out to them already and will wait for a response.    Reason for Disposition  [1] Caller has medicine question about med NOT prescribed by PCP AND [2] triager unable to answer question (e.g., compatibility with other med, storage)    Patient contacting prescriber  Answer Assessment - Initial Assessment Questions 1. NAME of MEDICINE: "What medicine(s) are you calling about?"     Oxycodone   2. QUESTION: "What is your question?" (e.g., double dose of medicine, side effect)     Is it safe to take  3. PRESCRIBER: "Who prescribed the medicine?" Reason: if prescribed by specialist, call should be referred to that group.     Dorthy Gavia, PA-C 4. SYMPTOMS: "Do you have any symptoms?" If Yes, ask: "What symptoms are you having?"  "How bad are the symptoms (e.g., mild, moderate, severe)     Knee pain and swelling  Protocols used: Medication Question Call-A-AH

## 2024-04-04 NOTE — Telephone Encounter (Signed)
 Some people will develop worsening pain for a few days after injection.  Ice, elevate and back off activity.  I sent in one small rx for oxycodone 

## 2024-04-04 NOTE — Telephone Encounter (Signed)
 Thank you :)

## 2024-04-04 NOTE — Telephone Encounter (Signed)
 Patient called and said he was in pain from having an injection in the knee, I sent a message to [facility] this morning  that  per the patient the pain was worse and he stated he went to an urgent care and they gave him Tylenol  codine and it did not help so he was calling us  back to get oxycodine for he pain

## 2024-04-04 NOTE — Telephone Encounter (Signed)
 Patient called and left a VM stating that he took the pain medication, but is still in pain and having swelling in his leg.  Stated that he has some fluid that needs to be drained. CB # [phone].  Please advise.  Thank you.

## 2024-04-04 NOTE — Telephone Encounter (Signed)
 Just responded to previous message

## 2024-04-04 NOTE — Telephone Encounter (Signed)
 Patient called back and stated that he would go over mayr's head if he has to and that I needed to do something about this situation he has  I informed him that I sent the message and that the PA was seeing other patients and would get back to him shortly and he said he would hate to file a law suit later on because he has evidence and I told him hold on and called wendy and  wendy and I advised the patient that his medication was sent in to his pharmacy and once I told him about the medication he calmed down and said thank you so much and hung up

## 2024-04-05 NOTE — Telephone Encounter (Signed)
 He can come in and see someone who has an opening if he would like

## 2024-04-05 NOTE — Telephone Encounter (Signed)
 Spoke with patient. He is doing much better currently. His swelling has went down. He has taken his pain medicine. I encouraged him to rest, ice, and elevate if needed. I ask him to call us  if anything changes. He then went on to say it took entirely too much time for someone to respond to his message.  I continued to apologize to patient and explained that I called as soon as I heard back from provider who was not in the office. He said that he could leave a bad review if he wanted, but wanted to let us  know.

## 2024-04-19 DIAGNOSIS — R42 Dizziness and giddiness: Secondary | ICD-10-CM | POA: Diagnosis not present

## 2024-04-19 DIAGNOSIS — Z951 Presence of aortocoronary bypass graft: Secondary | ICD-10-CM | POA: Diagnosis not present

## 2024-04-19 DIAGNOSIS — E785 Hyperlipidemia, unspecified: Secondary | ICD-10-CM | POA: Diagnosis not present

## 2024-04-19 DIAGNOSIS — I10 Essential (primary) hypertension: Secondary | ICD-10-CM | POA: Diagnosis not present

## 2024-04-19 DIAGNOSIS — I2510 Atherosclerotic heart disease of native coronary artery without angina pectoris: Secondary | ICD-10-CM | POA: Diagnosis not present

## 2024-04-19 DIAGNOSIS — K5909 Other constipation: Secondary | ICD-10-CM | POA: Diagnosis not present

## 2024-04-19 DIAGNOSIS — R001 Bradycardia, unspecified: Secondary | ICD-10-CM | POA: Diagnosis not present

## 2024-05-04 DIAGNOSIS — E785 Hyperlipidemia, unspecified: Secondary | ICD-10-CM | POA: Diagnosis not present

## 2024-05-04 DIAGNOSIS — R001 Bradycardia, unspecified: Secondary | ICD-10-CM | POA: Diagnosis not present

## 2024-05-04 DIAGNOSIS — I10 Essential (primary) hypertension: Secondary | ICD-10-CM | POA: Diagnosis not present

## 2024-05-04 DIAGNOSIS — R42 Dizziness and giddiness: Secondary | ICD-10-CM | POA: Diagnosis not present

## 2024-05-14 DIAGNOSIS — R42 Dizziness and giddiness: Secondary | ICD-10-CM | POA: Diagnosis not present

## 2024-05-14 DIAGNOSIS — R001 Bradycardia, unspecified: Secondary | ICD-10-CM | POA: Diagnosis not present

## 2024-05-14 DIAGNOSIS — I10 Essential (primary) hypertension: Secondary | ICD-10-CM | POA: Diagnosis not present

## 2024-05-14 DIAGNOSIS — E785 Hyperlipidemia, unspecified: Secondary | ICD-10-CM | POA: Diagnosis not present

## 2024-10-01 ENCOUNTER — Encounter: Payer: Self-pay | Admitting: Radiology

## 2024-12-01 NOTE — ED Provider Notes (Signed)
 ------------------------------------------------------------------------------- Attestation signed by Aloha Theo Furth, MD at 12/07/24 [id number] Patient encounter was completed by the [facility] listed on this note. I was not directly involved in the care of this patient; however, I was present in the department and available to offer assistance at any moment if requested. -------------------------------------------------------------------------------   [facility]  ED Provider Note  [name] 79 y.o. male DOB: January 08, 1946 MRN: [id number] History   Chief Complaint  Patient presents with   Shortness of Breath    Pt reports SHOb starting today, seen yesterday for fluid overload.    Patient is a 79 year old male with history of CHF, chronic liver disease, CKD, hyperlipidemia who is here for evaluation of shortness of breath that began this morning.  Patient reports that he has had a lot of fluid in his abdomen and was getting scheduled to have it drained off outpatient however he developed shortness of breath this morning.  He denies any chest pain but does report abdominal pain.  He does report a significant amount of weight gain over the past few days.  He is alert and oriented.  He is nontoxic-appearing and in no apparent distress.   Shortness of Breath Associated symptoms: no abdominal pain, no chest pain, no ear pain, no rash, no sore throat and no vomiting        Past Medical History:  Diagnosis Date   CHF (congestive heart failure) (*)    Chronic liver disease    CKD (chronic kidney disease) 2025   Hyperlipidemia    Hyperlipidemia     Past Surgical History:  Procedure Laterality Date   Carotid artery angioplasty     Colonoscopy     unsure   Median sternotomy N/A 11/29/2004    Social History   Substance and Sexual Activity  Alcohol Use Yes   Alcohol/week: 2.0 - 6.0 standard drinks of alcohol   Types: 2 - 6 Standard drinks or  equivalent per week   Tobacco Use History[1] E-Cigarettes   Vaping Use Never User    Start Date     Cartridges/Day     Quit Date     Social History   Substance and Sexual Activity  Drug Use Never         Allergies[2]  Home Medications   ACETAMINOPHEN  (TYLENOL ) 325 MG TABLET    Take two tablets (650 mg dose) by mouth every 6 (six) hours as needed for Pain.   ALPRAZOLAM  (XANAX ) 0.5 MG TABLET    Take one tablet (0.5 mg dose) by mouth daily as needed for Sleep or Anxiety. Max Daily Amount: 0.5 mg   AMLODIPINE  BESYLATE (NORVASC ) 2.5 MG TABLET    Take one tablet (2.5 mg dose) by mouth daily. Please hold amlodipine  if systolic blood pressure less than 115.   ASPIRIN  (ECOTRIN LOW DOSE) EC TABLET    Take one tablet (81 mg dose) by mouth daily.   CLOTRIMAZOLE  (LOTRIMIN ) 1% CREAM    Apply one Application topically 2 (two) times daily.   IRBESARTAN  (AVAPRO ) 300 MG TABLET    TAKE 1/2 TABLET (150 MG DOSE) BY MOUTH DAILY.   MELATONIN-PYRIDOXINE (MELATIN PO)    Take by mouth.   REPATHA  SURECLICK 140 MG/ML SOAJ PEN    INJECT 1 ML (140 MG DOSE) INTO THE SKIN EVERY 14 (FOURTEEN) DAYS.   SILDENAFIL CITRATE (VIAGRA) 100 MG TABLET    Take one tablet (100 mg dose) by mouth as needed for Erectile Dysfunction.   TAMSULOSIN (FLOMAX) 0.4  MG CAPS    Take one capsule (0.4 mg dose) by mouth daily.    Primary Survey   Exposure   No visible chest trauma.  No visible abdominal trauma.  No visible trauma noted on back exam.      Review of Systems   Review of Systems  Constitutional:  Negative for chills and fever.  HENT:  Negative for ear pain and sore throat.   Eyes:  Negative for pain and visual disturbance.  Respiratory:  Positive for shortness of breath. Negative for cough.   Cardiovascular:  Negative for chest pain and palpitations.  Gastrointestinal:  Positive for abdominal distention. Negative for abdominal pain and vomiting.  Genitourinary:  Negative for dysuria and hematuria.   Musculoskeletal:  Negative for arthralgias and back pain.  Skin:  Negative for color change and rash.  Neurological:  Negative for seizures and syncope.  All other systems reviewed and are negative.   Physical Exam   ED Triage Vitals [12/01/24 1050]  BP (!) 186/87  Heart Rate 63  Resp 18  SpO2 98 %  Temp 96.8 F (36 C)    Physical Exam  Nursing note and vitals reviewed. Constitutional: He appears well-developed and well-nourished. He is in good hygiene. He has good hygiene. He does not appear distressed and does not appear ill. Not diaphoretic. HENT:  Head: Normocephalic and atraumatic.  Mouth/Throat: Voice normal.  Eyes: Conjunctivae are normal.  Neck: Normal range of motion and voice normal.  Cardiovascular: Normal rate, regular rhythm and normal heart sounds.  Pulmonary/Chest: Respiratory effort normal and breath sounds normal. No visible chest trauma.  Abdominal: Abdomen appears distended. No visible abdominal trauma. Ascites. There is rigidity.  Musculoskeletal: Normal range of motion. No visible trauma noted on back exam.     Cervical back: Normal range of motion.     Right lower leg: No swelling. No edema.     Left lower leg: No swelling. No edema.   Neurological: He is alert and oriented to person, place, and time.  Skin: Skin is warm. Not diaphoretic. Skin is dry.     ED Course   Lab results:   CBC AND DIFFERENTIAL - Abnormal      Result Value   WBC 4.3     RBC 4.19 (*)    HGB 13.2 (*)    HCT 39.3 (*)    MCV 93.8 (*)    MCH 31.5     MCHC 33.6     Plt Ct 112 (*)    RDW SD 42.5     MPV 11.9     NRBC% 0.0     Absolute NRBC Count 0.00     NEUTROPHIL % 54.5     LYMPHOCYTE % 32.3     MONOCYTE % 10.8     Eosinophil % 1.4     BASOPHIL % 0.5     IG% 0.5     ABSOLUTE NEUTROPHIL COUNT 2.37     ABSOLUTE LYMPHOCYTE COUNT 1.40     Absolute Monocyte Count 0.47     Absolute Eosinophil Count 0.06     Absolute Basophil Count 0.02     Absolute Immature  Granulocyte Count 0.02    COMPREHENSIVE METABOLIC PANEL - Abnormal   Na 137     Potassium 4.2     Cl 100     CO2 24     AGAP 13     Glucose 112 (*)    BUN 19     Creatinine 1.10  Ca 9.0     ALK PHOS 78     T Bili 0.7     Total Protein 8.7 (*)    Alb 4.5     GLOBULIN 4.2     ALBUMIN/GLOBULIN RATIO 1.1     BUN/CREAT RATIO 17.3     ALT 10     AST 18     eGFR 69     Comment: Normal GFR (glomerular filtration rate) > 60 mL/min/1.73 meters squared, < 60 may include impaired kidney function. Calculation based on the Chronic Kidney Disease Epidemiology Collaboration (CK-EPI)equation refit without adjustment for race.  GEN5 CARDIAC TROPONIN T (TNT5) BASELINE - Normal   TnT-Gen5 (0hr) 7     Comment: An elevated Troponin indicates myocardial damage. Elevated troponin may also be due to pulmonary emboli, aortic dissection, heart failure, trauma, toxins and ischemia in the setting of critical illness.   NT-PROBNP - Normal   NT-ProBNP 96     Comment: Among patients presenting with dyspnea, NT-proBNP is highly sensitive for the detection of acute decompensated heart failure (ADHF). In addition, a NT-proBNP when used with the recommended age-independent exclusionary cut-point of <300 pg/mL effectively rules out ADHF with a negative predictive value (NPV) ranging from 97.7% for males to 98.5% for females.  MAGNESIUM - Normal   Mg 1.8    LIGHT BLUE TOP  LAVENDER TOP  MINT GREEN-TOP TUBE (PST GEL/LI HEP)  GOLD SST    Imaging:   XR CHEST AP PORTABLE   Narrative:    TECHNIQUE: Portable AP chest. Comparison study 01/07/2014.  HISTORY: Shortness of breath  FINDINGS: Image obtained in mid expiration. CABG. Cardiac size within normal limits. No acute infiltrate or effusion. Bony thorax intact.    Impression:    IMPRESSION: No active disease.  Electronically Signed by: Reyes People, MD on 12/01/2024 12:47 PM  CT ABDOMEN PELVIS WO IV CONTRAST   Narrative:    INDICATION: ascites   COMPARISON:  None.   TECHNIQUE:  CT ABDOMEN PELVIS WO IV CONTRAST no contrast administered. This limits evaluation of the bowel, organs, vasculature.. Dose reduction was utilized (automated exposure control, mA or kV adjustment based on patient size, or iterative image   FINDINGS:   SOLID VISCERA: - Liver: Cirrhotic morphology. Tiny granulomas. No discernible focal lesion. - Gallbladder: Cholelithiasis without cholecystitis. - Pancreas: Normal. - Adrenal glands: Normal. - Spleen: Borderline enlarged. - Kidneys: 4 cm exophytic left renal cyst. No hydronephrosis.  GI: - No bowel obstruction. Moderate colonic stool. - No inflammatory changes.   PERITONEAL CAVITY/RETROPERITONEUM: - No free fluid. - No pneumoperitoneum. - No lymphadenopathy. - Aorta, IVC, iliac arteries, and major visceral arteries are unremarkable without contrast.  PELVIS: - Unremarkable bladder and prostate.  VISUALIZED LOWER THORAX: - No acute abnormalities.  MUSCULOSKELETAL: - No acute or destructive osseous processes. Post initial mean. Degenerative changes in the spine and at the hips.  MISC: - N/A or as above.    Impression:    IMPRESSION:  1.  No visualized ascites. No acute abnormality on this noncontrasted study. 2. Cirrhosis, borderline splenomegaly.  Electronically Signed by: Reyes Luna, MD on 12/01/2024 2:02 PM     ECG: ECG Results   None          HEAR Score History: Mostly low risk features ECG: Non specific repolarisation disturbance Age: 78 or greater Risk Factors: 1 or 2 risk factors HEAR Score Total: 4  Pre-Sedation Procedures  ED Course as of 12/01/24 1607  Ashley SAUNDERS [facility] Documentation  Dju Dec 01, 2024  1424 No ascites noted on CT.  We will add on troponins, BNP and consider chest CT.  He does report weight gain and lower extremity edema but none noted on exam.  He was on  lasix  previously but had been taken off of it due to his kidney function.    Medical Decision Making Patient's workup is overall reassuring.  Labs were all primarily unremarkable.  CT of the abdomen and pelvis without contrast was completed with no ascites noted.  His troponin is 7 and EKG showed sinus rhythm with first-degree block.  proBNP was within normal limits and chest x-ray was negative for any acute cardiopulmonary process.  Patient was discussed with Dr. Jefrey of gastroenterology who reports that there is nothing else for us  to do at this time.  Patient does have a significant amount of anxiety about his blood pressure.  He does not have any symptoms of hypertensive urgency so we did discuss that it is not recommended that we treat blood pressures in the emergency room.  Patient repeatedly asking to have the fluid drained off but we have discussed that there is no fluid for us  to drain off.  Patient requesting enema and there is a decent amount of stool noted on CT so this was ordered.  Patient ended up declining enema stating that he wanted to be discharged.   Given patient's reassuring workup and well-appearing nature, he is appropriate for outpatient management.  Conservative symptom management discussed.  Encouraged to follow-up with PCP in the next 5-7 days.   Strict follow-up precautions were given.  Patient expressed understanding and all questions were answered.  Further instructions provided in the discharge summary.  Amount and/or Complexity of Data Reviewed Labs: ordered. Decision-making details documented in ED Course. Radiology: ordered. Decision-making details documented in ED Course. ECG/medicine tests: ordered. Decision-making details documented in ED Course.  Risk Prescription drug management.          Provider Communication  New Prescriptions   No medications on file    Modified Medications   No medications on file    Discontinued Medications   No  medications on file    Clinical Impression Final diagnoses:  Dyspnea, unspecified type  Alcoholic cirrhosis of liver without ascites (*)  Hypertension, unspecified type    ED Disposition     ED Disposition  Discharge   Condition  Stable   Comment  --                   Electronically signed by:       [1] Social History Tobacco Use  Smoking Status Former   Current packs/day: 0.00   Average packs/day: 1 pack/day for 3.0 years (3.0 ttl pk-yrs)   Types: Cigarettes   Start date: 32   Quit date: 5   Years since quitting: 55.0   Passive exposure: Past  Smokeless Tobacco Former   Quit date: 1971  [2] Allergies Allergen Reactions   Codeine Palpitations   Hydrocodone -Acetaminophen  Abdominal Pain, Nausea And Vomiting, Palpitations and Swelling   Vicodin [Hydrocodone -Acetaminophen ] Unknown   Ashley SAUNDERS [place], FNP 12/01/24 [phone]

## 2024-12-07 NOTE — Procedures (Signed)
 No fluid visualized in any of the 4 quadrants of the abdomen with ultrasound.  No paracentesis performed.

## 2024-12-25 ENCOUNTER — Emergency Department (HOSPITAL_COMMUNITY)

## 2024-12-25 ENCOUNTER — Other Ambulatory Visit: Payer: Self-pay

## 2024-12-25 ENCOUNTER — Emergency Department (HOSPITAL_COMMUNITY)
Admission: EM | Admit: 2024-12-25 | Discharge: 2024-12-25 | Disposition: A | Discharge status: Home / Self Care | Attending: Emergency Medicine | Admitting: Emergency Medicine

## 2024-12-25 DIAGNOSIS — I5032 Chronic diastolic (congestive) heart failure: Secondary | ICD-10-CM | POA: Diagnosis not present

## 2024-12-25 DIAGNOSIS — N183 Chronic kidney disease, stage 3 unspecified: Secondary | ICD-10-CM | POA: Insufficient documentation

## 2024-12-25 DIAGNOSIS — R079 Chest pain, unspecified: Secondary | ICD-10-CM | POA: Insufficient documentation

## 2024-12-25 DIAGNOSIS — R109 Unspecified abdominal pain: Secondary | ICD-10-CM | POA: Diagnosis not present

## 2024-12-25 DIAGNOSIS — Z7982 Long term (current) use of aspirin: Secondary | ICD-10-CM | POA: Insufficient documentation

## 2024-12-25 LAB — CBC
HCT: 43.3 % (ref 39.0–52.0)
Hemoglobin: 14.4 g/dL (ref 13.0–17.0)
MCH: 31.5 pg (ref 26.0–34.0)
MCHC: 33.3 g/dL (ref 30.0–36.0)
MCV: 94.7 fL (ref 80.0–100.0)
Platelets: 94 10*3/uL — ABNORMAL LOW (ref 150–400)
RBC: 4.57 MIL/uL (ref 4.22–5.81)
RDW: 11.8 % (ref 11.5–15.5)
WBC: 5.5 10*3/uL (ref 4.0–10.5)
nRBC: 0 % (ref 0.0–0.2)

## 2024-12-25 LAB — TROPONIN T, HIGH SENSITIVITY
Troponin T High Sensitivity: 9 ng/L (ref 0–19)
Troponin T High Sensitivity: 13 ng/L (ref 0–19)

## 2024-12-25 LAB — BASIC METABOLIC PANEL WITH GFR
Anion gap: 15 (ref 5–15)
BUN: 23 mg/dL (ref 8–23)
CO2: 19 mmol/L — ABNORMAL LOW (ref 22–32)
Calcium: 9.5 mg/dL (ref 8.9–10.3)
Chloride: 101 mmol/L (ref 98–111)
Creatinine, Ser: 1.22 mg/dL (ref 0.61–1.24)
GFR, Estimated: >60 mL/min
Glucose, Bld: 118 mg/dL — ABNORMAL HIGH (ref 70–99)
Potassium: 4.4 mmol/L (ref 3.5–5.1)
Sodium: 135 mmol/L (ref 135–145)

## 2024-12-25 LAB — PRO BRAIN NATRIURETIC PEPTIDE: Pro Brain Natriuretic Peptide: 68.6 pg/mL

## 2024-12-25 LAB — HEPATIC FUNCTION PANEL
ALT: 13 U/L (ref 0–44)
AST: 24 U/L (ref 15–41)
Albumin: 4.5 g/dL (ref 3.5–5.0)
Alkaline Phosphatase: 73 U/L (ref 38–126)
Bilirubin, Direct: 0.1 mg/dL (ref 0.0–0.2)
Indirect Bilirubin: 0.6 mg/dL (ref 0.3–0.9)
Total Bilirubin: 0.7 mg/dL (ref 0.0–1.2)
Total Protein: 8.8 g/dL — ABNORMAL HIGH (ref 6.5–8.1)

## 2024-12-25 LAB — ETHANOL: Alcohol, Ethyl (B): <15 mg/dL

## 2024-12-25 LAB — POC OCCULT BLOOD, ED: Fecal Occult Bld: NEGATIVE

## 2024-12-25 LAB — D-DIMER, QUANTITATIVE: D-Dimer, Quant: 2.5 ug{FEU}/mL — ABNORMAL HIGH (ref 0.00–0.50)

## 2024-12-25 LAB — AMMONIA: Ammonia: 26 umol/L (ref 9–35)

## 2024-12-25 MED ORDER — ALUM & MAG HYDROXIDE-SIMETH 200-200-20 MG/5ML PO SUSP
30.0000 mL | Freq: Once | ORAL | Status: AC
  Administered 2024-12-25: 30 mL via ORAL
  Filled 2024-12-25: qty 30

## 2024-12-25 MED ORDER — PANTOPRAZOLE SODIUM 40 MG IV SOLR
80.0000 mg | Freq: Once | INTRAVENOUS | Status: AC
  Administered 2024-12-25: 80 mg via INTRAVENOUS
  Filled 2024-12-25: qty 20

## 2024-12-25 MED ORDER — IOHEXOL 350 MG/ML SOLN
75.0000 mL | Freq: Once | INTRAVENOUS | Status: AC | PRN
  Administered 2024-12-25: 75 mL via INTRAVENOUS

## 2024-12-25 MED ORDER — PANTOPRAZOLE SODIUM 20 MG PO TBEC: 20.0000 mg | DELAYED_RELEASE_TABLET | Freq: Every day | ORAL | 0 refills | Status: AC

## 2024-12-25 NOTE — ED Notes (Signed)
 Sister coming to get patient

## 2024-12-25 NOTE — ED Triage Notes (Signed)
 Pt BIB [facility] from home. Per pt, CP woke him up out of his sleep at 0400. Initially 8/10 pressure center of his chest. Pt states he has black-tarry stools for 1 week. EMS gave 324 mg aspirin  and 0.4 mg nitroglycerin  SL.

## 2024-12-25 NOTE — ED Provider Notes (Signed)
 " Physical Exam  BP (!) 163/72   Pulse 60   Temp (!) 97.1 F (36.2 C) (Temporal)   Resp 15   Ht 5' 11 (1.803 m)   Wt 113.4 kg   SpO2 96%   BMI 34.87 kg/m   Physical Exam Constitutional:      Comments: A&O x4, well appearing.   Cardiovascular:     Rate and Rhythm: Normal rate and regular rhythm.  Pulmonary:     Effort: Pulmonary effort is normal.     Breath sounds: Normal breath sounds.  Chest:     Chest wall: Tenderness present.  Abdominal:     General: There is no distension.     Tenderness: There is abdominal tenderness.     Comments: MILD TTP to epigastric area.  Genitourinary:    Rectum: Guaiac result negative.  Musculoskeletal:     Right lower leg: No edema.     Left lower leg: No edema.     Procedures  Procedures  ED Course / MDM   Clinical Course as of 12/25/24 0926  Tue Dec 25, 2024  0640 Patient with past medical history of congestive heart failure with last echo showing a normal EF 60% in 2025, coronary artery disease status post CABG, hypertension hyperlipidemia and alcohol cirrhosis presents to emergency room with multiple complaints. Signed of from prior PA at shift change.   Complaints including having an episode of chest discomfort with EMS, shortness of breath and abdominal pain.  He reports 1 episode of black tarry stools with this. He is not anticoagulated. Vitals are stable. At time of sign out patient has normal chest x-ray and EKG showing normal sinus. Labs work ordered by previous PA pending. Will add hemoccult. [JB]  0720 Lab work has resulted CBC with no leukocytosis, no anemia.  BMP is unremarkable.  Hepatic function panel shows normal total bilirubin, normal LFTs.  EtOH is under 15.  Ammonia 26. proBNP is within normal limits and troponin is 9.  Will obtain delta troponin.  D-dimer was elevated at 2.5 so CT angio PE study was ordered.  I did discuss results with the patient and he he states that he has had a burning sensation in the center of  his abdomen mostly localizing to his epigastric area that radiates up the chest and is associated with belching and burping.  He reports that sometimes the pain is very severe so we will take his breath away he denies any specific shortness of breath, swelling of his abdomen, swelling in feet and ankles, cough or fever. Had a BM yesterday which he reports was dark., he is passing gas. [JB]  0910 CT angio shows no evidence of pulmonary embolism.  Also shows 4 mm right lower lung nodule, no follow-up needed.  Arthrosclerosis, status post CABG.  CT abdomen and pelvis shows cirrhosis with no ascites.  Mild wall thickening of the small and large bowels with no surrounding inflammation.  Likely nonspecific but could reflect IBD.  Gallstones without acute cholecystitis. [JB]  [id number] Hemoccult negative. With [employer]. No melena or gross red blood.  [JB]  [id number] Patient has overall reassuring workup here.  Hemodynamically stable and well-appearing.  Suspect that this is gastritis versus GERD.  Will treat with PPI.  Patient is established with GI and will call to schedule follow-up.  Feel appropriate for discharge, given return precautions.  [JB]    Clinical Course User Index [JB] [name], Ronald SAILOR, PA-C   Medical Decision Making Amount and/or  Complexity of Data Reviewed Labs: ordered. Radiology: ordered.  Risk OTC drugs. Prescription drug management.          [name] 12/25/24 9072    Laurice Maude BROCKS, MD 12/25/24 1058  "

## 2024-12-25 NOTE — Discharge Instructions (Addendum)
 Your imaging and lab work was overall reassuring today.  Please call your GI doctor to schedule follow-up.  In the meantime I would try GERD diet and start Protonix  daily.  Return to the ED with new or worsening symptoms.

## 2024-12-25 NOTE — ED Provider Notes (Signed)
 " [place] EMERGENCY DEPARTMENT AT [facility] Provider Note   CSN: [id number] Arrival date & time: 12/25/24  9472     Patient presents with: Chest Pain   [name] is a 79 y.o. male with history of PTSD, chronic alcohol abuse, chronic diastolic heart failure, alcoholic cirrhosis of liver, chronic appetite C, CPAP, anginal pains, CKD stage III, constipation.  Patient presents to ED complaining of chest pain, shortness of breath, abdominal pain, indigestion.  Reports symptoms began this evening, states that his chest pain and shortness of breath woke him from his sleep.  Reports that pain is located centrally and is associated with belching and nausea.  He denies any current nausea or vomiting.  Denies any diarrhea at home but does report he has been constipated, has not had a bowel movement in 1 day.  Reports history of the same.  Endorsing chest pain that began around him waking up as well as shortness of breath.  He denies lightheadedness, dizziness, weakness.  Denies any syncope at home.  Reports nitroglycerin  was given by EMS and did slightly relieve chest pain.  Patient also reports feeling disoriented over the last few days, seemingly forgetting things.  He is alert and oriented x 4 here.  Patient also reporting that he has had a few days of dark tarry stools.  Denies blood thinners or history of GI bleeds.   Chest Pain Associated symptoms: shortness of breath        Prior to Admission medications  Medication Sig Start Date End Date Taking? Authorizing Provider  aspirin  EC 81 MG tablet Take 1 tablet (81 mg total) by mouth daily. Swallow whole. 01/03/23   Hobart Powell BRAVO, MD  atorvastatin  (LIPITOR) 10 MG tablet Take 1 tablet (10 mg total) by mouth daily. 01/31/23 01/31/24  Krishnan, Sendil K, MD  clotrimazole  (LOTRIMIN ) 1 % cream Apply to affected area 2 times daily 12/22/23   Enedelia Dorna CHRISTELLA, FNP  escitalopram  (LEXAPRO ) 5 MG tablet Take 5 mg by mouth as needed  (depression/anxiety/PTSD). 11/09/21   [provider]  indapamide  (LOZOL ) 1.25 MG tablet Take 1 tablet (1.25 mg total) by mouth daily. Patient not taking: Reported on 01/30/2023 12/07/21   Claudene Victory ORN, MD  irbesartan  (AVAPRO ) 300 MG tablet Take 0.5 tablets (150 mg total) by mouth daily. 01/31/23   Krishnan, Sendil K, MD  oxyCODONE  (ROXICODONE ) 5 MG immediate release tablet Take 1 tablet (5 mg total) by mouth 2 (two) times daily as needed for severe pain (pain score 7-10). 04/04/24   Jule Ronal CROME, PA-C  predniSONE  (DELTASONE ) 20 MG tablet 3 tabs po daily x 3 days, then 2 tabs x 3 days, then 1.5 tabs x 3 days, then 1 tab x 3 days, then 0.5 tabs x 3 days Patient not taking: Reported on 04/03/2024 01/25/24   Freddi Hamilton, MD  REPATHA  SURECLICK 140 MG/ML SOAJ INJECT 140 MG INTO THE SKIN EVERY 14 (FOURTEEN) DAYS. 03/29/22   Claudene Victory ORN, MD  sildenafil (VIAGRA) 100 MG tablet Take 100 mg by mouth daily as needed for erectile dysfunction. 06/17/21   [provider]  spironolactone  (ALDACTONE ) 25 MG tablet Take 1 tablet (25 mg total) by mouth daily. Patient not taking: Reported on 01/24/2023 12/03/21   Claudene Victory ORN, MD    Allergies: Codeine and Vicodin [hydrocodone -acetaminophen ]    Review of Systems  Respiratory:  Positive for shortness of breath.   Cardiovascular:  Positive for chest pain.  All other systems reviewed and  are negative.   Updated Vital Signs BP (!) 157/76   Pulse (!) 59   Temp (!) 97.1 F (36.2 C) (Temporal)   Resp 12   Ht 5' 11 (1.803 m)   Wt 113.4 kg   SpO2 98%   BMI 34.87 kg/m   Physical Exam Vitals and nursing note reviewed.  Constitutional:      General: He is not in acute distress.    Appearance: He is well-developed.     Comments: Resting comfortably in bed  HENT:     Head: Normocephalic and atraumatic.  Eyes:     Conjunctiva/sclera: Conjunctivae normal.  Cardiovascular:     Rate and Rhythm: Normal rate and regular rhythm.     Heart sounds:  No murmur heard. Pulmonary:     Effort: Pulmonary effort is normal. No respiratory distress.     Breath sounds: Normal breath sounds.  Abdominal:     Palpations: Abdomen is soft.     Tenderness: There is no abdominal tenderness.  Musculoskeletal:        General: No swelling.     Cervical back: Neck supple.     Right lower leg: No edema.     Left lower leg: No edema.     Comments: No asterixis  Skin:    General: Skin is warm and dry.     Capillary Refill: Capillary refill takes less than 2 seconds.  Neurological:     Mental Status: He is alert and oriented to person, place, and time. Mental status is at baseline.     Comments: CN III through XII intact.  Intact finger-to-nose, heel-to-shin.  No pronator drift.  No slurred speech.  Equal strength throughout.  Equal sensation throughout.  Alert and oriented x 4.  PERRL.  Tracks across midline.  Psychiatric:        Mood and Affect: Mood normal.     (all labs ordered are listed, but only abnormal results are displayed) Labs Reviewed  CBC  AMMONIA  ETHANOL  PRO BRAIN NATRIURETIC PEPTIDE  BASIC METABOLIC PANEL WITH GFR  HEPATIC FUNCTION PANEL  D-DIMER, QUANTITATIVE  TROPONIN T, HIGH SENSITIVITY    EKG: EKG Interpretation Date/Time:  Tuesday December 25 2024 05:31:13 EST Ventricular Rate:  68 PR Interval:  228 QRS Duration:  101 QT Interval:  406 QTC Calculation: 432 R Axis:   75  Text Interpretation: Sinus rhythm Prolonged PR interval Confirmed by Lorette Mayo [phone]) on 12/25/2024 5:55:40 AM  Radiology: [facility] Chest Port 1 View Result Date: 12/25/2024 EXAM: 1 VIEW(S) XRAY OF THE CHEST 12/25/2024 06:21:00 AM COMPARISON: 01/29/2023. CLINICAL HISTORY: Chest pain, shortness of breath. FINDINGS: LUNGS AND PLEURA: No focal pulmonary opacity. No pleural effusion. No pneumothorax. HEART AND MEDIASTINUM: Atherosclerotic plaque. No acute abnormality of the cardiac and mediastinal silhouettes. BONES AND SOFT TISSUES: Sternotomy wires in  place. No acute osseous abnormality. IMPRESSION: 1. No acute findings. Electronically signed by: Waddell Calk MD 12/25/2024 06:25 AM EST RP Workstation: [id number]    Procedures   Medications Ordered in the ED  alum & mag hydroxide-simeth (MAALOX/MYLANTA) 200-200-20 MG/5ML suspension 30 mL (30 mLs Oral Given 12/25/24 9374)     Medical Decision Making  79 year old male presents for evaluation.  Please see HPI.  On exam patient is afebrile and nontachycardic.  His lung sounds are clear bilaterally, no hypoxia.  Abdomen soft and compressible.  Neuroexam at baseline.  Patient will be assessed utilizing CBC, BMP, ammonia, D-dimer, BNP, ethanol, hepatic function panel, troponin, chest x-ray and EKG.  At end of shift, patient workup not complete.  Signed out to oncoming provider Barrett PA-C.  Plan of management discussed.    Final diagnoses:  Chest pain, unspecified type    ED Discharge Orders     None          Ruthell Lonni FALCON, [place] 12/25/24 9367  "
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
## 2024-12-30 ENCOUNTER — Observation Stay (HOSPITAL_COMMUNITY)
Admission: EM | Admit: 2024-12-30 | Discharge: 2024-12-31 | Disposition: A | Attending: Emergency Medicine | Admitting: Emergency Medicine

## 2024-12-30 ENCOUNTER — Observation Stay (HOSPITAL_COMMUNITY)

## 2024-12-30 ENCOUNTER — Encounter (HOSPITAL_COMMUNITY): Payer: Self-pay

## 2024-12-30 ENCOUNTER — Other Ambulatory Visit: Payer: Self-pay

## 2024-12-30 ENCOUNTER — Emergency Department (HOSPITAL_COMMUNITY)

## 2024-12-30 DIAGNOSIS — B182 Chronic viral hepatitis C: Secondary | ICD-10-CM | POA: Insufficient documentation

## 2024-12-30 DIAGNOSIS — N1831 Chronic kidney disease, stage 3a: Secondary | ICD-10-CM | POA: Insufficient documentation

## 2024-12-30 DIAGNOSIS — I2510 Atherosclerotic heart disease of native coronary artery without angina pectoris: Secondary | ICD-10-CM | POA: Insufficient documentation

## 2024-12-30 DIAGNOSIS — K7460 Unspecified cirrhosis of liver: Secondary | ICD-10-CM | POA: Insufficient documentation

## 2024-12-30 DIAGNOSIS — Z79899 Other long term (current) drug therapy: Secondary | ICD-10-CM | POA: Insufficient documentation

## 2024-12-30 DIAGNOSIS — E785 Hyperlipidemia, unspecified: Secondary | ICD-10-CM | POA: Insufficient documentation

## 2024-12-30 DIAGNOSIS — Z955 Presence of coronary angioplasty implant and graft: Secondary | ICD-10-CM | POA: Insufficient documentation

## 2024-12-30 DIAGNOSIS — K219 Gastro-esophageal reflux disease without esophagitis: Secondary | ICD-10-CM | POA: Insufficient documentation

## 2024-12-30 DIAGNOSIS — F32A Depression, unspecified: Secondary | ICD-10-CM | POA: Insufficient documentation

## 2024-12-30 DIAGNOSIS — I130 Hypertensive heart and chronic kidney disease with heart failure and stage 1 through stage 4 chronic kidney disease, or unspecified chronic kidney disease: Secondary | ICD-10-CM | POA: Insufficient documentation

## 2024-12-30 DIAGNOSIS — G459 Transient cerebral ischemic attack, unspecified: Secondary | ICD-10-CM | POA: Diagnosis not present

## 2024-12-30 DIAGNOSIS — G43409 Hemiplegic migraine, not intractable, without status migrainosus: Secondary | ICD-10-CM

## 2024-12-30 DIAGNOSIS — Z8659 Personal history of other mental and behavioral disorders: Secondary | ICD-10-CM | POA: Insufficient documentation

## 2024-12-30 DIAGNOSIS — R188 Other ascites: Secondary | ICD-10-CM | POA: Insufficient documentation

## 2024-12-30 DIAGNOSIS — D696 Thrombocytopenia, unspecified: Secondary | ICD-10-CM | POA: Insufficient documentation

## 2024-12-30 DIAGNOSIS — G4733 Obstructive sleep apnea (adult) (pediatric): Secondary | ICD-10-CM | POA: Insufficient documentation

## 2024-12-30 DIAGNOSIS — I5032 Chronic diastolic (congestive) heart failure: Secondary | ICD-10-CM | POA: Insufficient documentation

## 2024-12-30 DIAGNOSIS — F4310 Post-traumatic stress disorder, unspecified: Secondary | ICD-10-CM | POA: Insufficient documentation

## 2024-12-30 LAB — DIFFERENTIAL
Abs Immature Granulocytes: 0.01 10*3/uL (ref 0.00–0.07)
Basophils Absolute: 0 10*3/uL (ref 0.0–0.1)
Basophils Relative: 1 %
Eosinophils Absolute: 0.1 10*3/uL (ref 0.0–0.5)
Eosinophils Relative: 2 %
Immature Granulocytes: 0 %
Lymphocytes Relative: 35 %
Lymphs Abs: 1.5 10*3/uL (ref 0.7–4.0)
Monocytes Absolute: 0.4 10*3/uL (ref 0.1–1.0)
Monocytes Relative: 9 %
Neutro Abs: 2.2 10*3/uL (ref 1.7–7.7)
Neutrophils Relative %: 53 %
Smear Review: NORMAL

## 2024-12-30 LAB — CBC
HCT: 39.9 % (ref 39.0–52.0)
Hemoglobin: 13.6 g/dL (ref 13.0–17.0)
MCH: 31.4 pg (ref 26.0–34.0)
MCHC: 34.1 g/dL (ref 30.0–36.0)
MCV: 92.1 fL (ref 80.0–100.0)
Platelets: 53 10*3/uL — ABNORMAL LOW (ref 150–400)
RBC: 4.33 MIL/uL (ref 4.22–5.81)
RDW: 11.9 % (ref 11.5–15.5)
WBC: 4.2 10*3/uL (ref 4.0–10.5)
nRBC: 0 % (ref 0.0–0.2)

## 2024-12-30 LAB — URINE DRUG SCREEN
Amphetamines: NEGATIVE
Barbiturates: NEGATIVE
Benzodiazepines: NEGATIVE
Cocaine: NEGATIVE
Fentanyl: NEGATIVE
Methadone Scn, Ur: NEGATIVE
Opiates: NEGATIVE
Tetrahydrocannabinol: NEGATIVE

## 2024-12-30 LAB — I-STAT CHEM 8, ED
BUN: 38 mg/dL — ABNORMAL HIGH (ref 8–23)
Calcium, Ion: 1.27 mmol/L (ref 1.15–1.40)
Chloride: 106 mmol/L (ref 98–111)
Creatinine, Ser: 1.4 mg/dL — ABNORMAL HIGH (ref 0.61–1.24)
Glucose, Bld: 103 mg/dL — ABNORMAL HIGH (ref 70–99)
HCT: 44 % (ref 39.0–52.0)
Hemoglobin: 15 g/dL (ref 13.0–17.0)
Potassium: 4.5 mmol/L (ref 3.5–5.1)
Sodium: 141 mmol/L (ref 135–145)
TCO2: 21 mmol/L — ABNORMAL LOW (ref 22–32)

## 2024-12-30 LAB — ETHANOL: Alcohol, Ethyl (B): <15 mg/dL

## 2024-12-30 MED ORDER — MAGNESIUM SULFATE 2 GM/50ML IV SOLN
2.0000 g | Freq: Once | INTRAVENOUS | Status: AC
  Administered 2024-12-30: 2 g via INTRAVENOUS
  Filled 2024-12-30: qty 50

## 2024-12-30 MED ORDER — ASPIRIN 81 MG PO TBEC
81.0000 mg | DELAYED_RELEASE_TABLET | Freq: Every day | ORAL | Status: DC
  Administered 2024-12-30 – 2024-12-31 (×2): 81 mg via ORAL
  Filled 2024-12-30 (×2): qty 1

## 2024-12-30 MED ORDER — PROCHLORPERAZINE EDISYLATE 10 MG/2ML IJ SOLN
10.0000 mg | INTRAMUSCULAR | Status: AC
  Filled 2024-12-30: qty 2

## 2024-12-30 MED ORDER — SODIUM CHLORIDE 0.9 % IV SOLN: INTRAVENOUS | Status: DC

## 2024-12-30 MED ORDER — STROKE: EARLY STAGES OF RECOVERY BOOK
Freq: Once | Status: AC
  Filled 2024-12-30: qty 1

## 2024-12-30 MED ORDER — ACETAMINOPHEN 325 MG PO TABS
650.0000 mg | ORAL_TABLET | ORAL | Status: DC | PRN
  Administered 2024-12-30: 650 mg via ORAL
  Filled 2024-12-30: qty 2

## 2024-12-30 MED ORDER — SENNOSIDES-DOCUSATE SODIUM 8.6-50 MG PO TABS
1.0000 | ORAL_TABLET | Freq: Two times a day (BID) | ORAL | Status: DC
  Administered 2024-12-30 – 2024-12-31 (×3): 1 via ORAL
  Filled 2024-12-30 (×3): qty 1

## 2024-12-30 MED ORDER — HEPARIN SODIUM (PORCINE) 5000 UNIT/ML IJ SOLN
5000.0000 [IU] | Freq: Three times a day (TID) | INTRAMUSCULAR | Status: DC
  Filled 2024-12-30: qty 1

## 2024-12-30 MED ORDER — ALPRAZOLAM 0.25 MG PO TABS
0.5000 mg | ORAL_TABLET | Freq: Every day | ORAL | Status: DC | PRN
  Administered 2024-12-30: 0.5 mg via ORAL
  Filled 2024-12-30: qty 2

## 2024-12-30 MED ORDER — ACETAMINOPHEN 650 MG RE SUPP: 650.0000 mg | RECTAL | Status: DC | PRN

## 2024-12-30 MED ORDER — POLYETHYLENE GLYCOL 3350 17 G PO PACK
17.0000 g | PACK | Freq: Every day | ORAL | Status: DC | PRN
  Administered 2024-12-31: 17 g via ORAL
  Filled 2024-12-30: qty 1

## 2024-12-30 MED ORDER — ATORVASTATIN CALCIUM 10 MG PO TABS: 10.0000 mg | ORAL_TABLET | Freq: Every day | ORAL | Status: DC

## 2024-12-30 MED ORDER — ACETAMINOPHEN 160 MG/5ML PO SOLN: 650.0000 mg | ORAL | Status: DC | PRN

## 2024-12-30 MED ORDER — SODIUM CHLORIDE 0.9 % IV BOLUS
500.0000 mL | Freq: Once | INTRAVENOUS | Status: AC
  Administered 2024-12-30: 500 mL via INTRAVENOUS

## 2024-12-30 NOTE — ED Notes (Signed)
 Pt requesting to speak with the provider about his heart rate being low. Pt advised that his HR has been consistent since he has been here, he states that he is still concerned and demanding to speak with the dr. Rosan, DO notified.

## 2024-12-30 NOTE — ED Notes (Addendum)
 Pt refusing Heparin  injection and states that he has had a bad experience in the past. Pt also refused CT and states that he is allergic to contrast and the doctor states that all he needs is the MRI. Hoffman, DO notified.

## 2024-12-30 NOTE — ED Notes (Signed)
 Pt request IV team to get blood because he is a difficult stick.   Provider aware...KM

## 2024-12-30 NOTE — ED Provider Notes (Signed)
" °  Physical Exam  BP (!) 147/71   Pulse 64   Temp 98 F (36.7 C) (Oral)   Resp 15   Ht 5' 11 (1.803 m)   SpO2 98%   BMI 34.87 kg/m   Physical Exam  Procedures  Procedures  ED Course / MDM    Medical Decision Making Amount and/or Complexity of Data Reviewed Labs: ordered. Radiology: ordered.  Risk Prescription drug management.   Labs reassuring.  Unassigned medicine admission.       Patsey Lot, MD 12/30/24 [phone]  "
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
## 2024-12-30 NOTE — ED Notes (Signed)
 Pt refusing atorvastatin , states that he takes a Repatha  shot for his Cholesterol and he is not due for that yet. Hoffman, DO notified.
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
## 2024-12-30 NOTE — Code Documentation (Signed)
 Stroke Response Nurse Documentation Code Documentation  [name] is a 79 y.o. male arriving to [facility]  via [facility] on 2/1 with past medical hx of HLD, PTSD, thrombocytopenia, HTN, CAD, depression . On No antithrombotic. Code stroke was activated by EMS.   Patient from home where he was LKW at 2200 and now complaining of HA and right sided weakness.   Stroke team at the bedside on patient arrival. Labs drawn and patient cleared for CT by Dr. Nettie. Patient to CT with team. NIHSS 0, see documentation for details and code stroke times. Patient with no deficits on exam. The following imaging was completed:  CT Head. Patient is not a candidate for IV Thrombolytic due to outside of window. Patient is not a candidate for IR due to low suspicion for stroke.   Care Plan: Neuro check q2hrs.    Bedside handoff with ED RN Milo.    Griselda Alm ORN  Rapid Response RN
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
## 2024-12-30 NOTE — ED Provider Notes (Signed)
 Signed out to Dr. Patsey pending admission    Danilyn Cocke, MD 12/30/24 [phone]
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
## 2024-12-30 NOTE — Care Management (Signed)
" °  Transition of Care [facility]) Screening Note   Patient Details  Name: IGNACE MANDIGO Date of Birth: 08/25/1946   Transition of Care [facility]) CM/SW Contact:    Corean JAYSON Canary, RN Phone Number: 12/30/2024, 1:55 PM    Transition of Care Department [facility]) has reviewed patient and no TOC needs have been identified at this time. We will continue to monitor patient advancement through interdisciplinary progression rounds. If new patient transition needs arise, please place a TOC consult.   "

## 2024-12-30 NOTE — ED Notes (Signed)
 Pt yells out that he is peeing on himself.  This RN goes into the room and assist pt to standing, hands the pt a urinal and told him that I would give him privacy and he responds, ok. A NT reports the pt was complaining that no one was helping him, the pt did not call out for help prior to urinating on himself. Pt assisted to clean himself and bedding replaced by NT. Charge RN made aware.

## 2024-12-31 ENCOUNTER — Observation Stay (HOSPITAL_COMMUNITY)

## 2024-12-31 DIAGNOSIS — I361 Nonrheumatic tricuspid (valve) insufficiency: Secondary | ICD-10-CM

## 2024-12-31 DIAGNOSIS — G4489 Other headache syndrome: Secondary | ICD-10-CM

## 2024-12-31 DIAGNOSIS — I10 Essential (primary) hypertension: Secondary | ICD-10-CM

## 2024-12-31 DIAGNOSIS — Z7982 Long term (current) use of aspirin: Secondary | ICD-10-CM

## 2024-12-31 DIAGNOSIS — E785 Hyperlipidemia, unspecified: Secondary | ICD-10-CM

## 2024-12-31 LAB — COMPREHENSIVE METABOLIC PANEL WITH GFR
ALT: 12 U/L (ref 0–44)
AST: 17 U/L (ref 15–41)
Albumin: 4.1 g/dL (ref 3.5–5.0)
Alkaline Phosphatase: 56 U/L (ref 38–126)
Anion gap: 9 (ref 5–15)
BUN: 30 mg/dL — ABNORMAL HIGH (ref 8–23)
CO2: 22 mmol/L (ref 22–32)
Calcium: 9.1 mg/dL (ref 8.9–10.3)
Chloride: 105 mmol/L (ref 98–111)
Creatinine, Ser: 1.32 mg/dL — ABNORMAL HIGH (ref 0.61–1.24)
GFR, Estimated: 56 mL/min — ABNORMAL LOW
Glucose, Bld: 110 mg/dL — ABNORMAL HIGH (ref 70–99)
Potassium: 4.6 mmol/L (ref 3.5–5.1)
Sodium: 137 mmol/L (ref 135–145)
Total Bilirubin: 0.4 mg/dL (ref 0.0–1.2)
Total Protein: 7.7 g/dL (ref 6.5–8.1)

## 2024-12-31 LAB — CBC
HCT: 38.3 % — ABNORMAL LOW (ref 39.0–52.0)
Hemoglobin: 13 g/dL (ref 13.0–17.0)
MCH: 31.8 pg (ref 26.0–34.0)
MCHC: 33.9 g/dL (ref 30.0–36.0)
MCV: 93.6 fL (ref 80.0–100.0)
Platelets: 93 10*3/uL — ABNORMAL LOW (ref 150–400)
RBC: 4.09 MIL/uL — ABNORMAL LOW (ref 4.22–5.81)
RDW: 11.9 % (ref 11.5–15.5)
WBC: 4.5 10*3/uL (ref 4.0–10.5)
nRBC: 0 % (ref 0.0–0.2)

## 2024-12-31 LAB — HEMOGLOBIN A1C
Hgb A1c MFr Bld: 5.8 % — ABNORMAL HIGH (ref 4.8–5.6)
Mean Plasma Glucose: 119.76 mg/dL

## 2024-12-31 LAB — ECHOCARDIOGRAM COMPLETE
Area-P 1/2: 3.34 cm2
Height: 71 in
P 1/2 time: 528 ms
S' Lateral: 3.4 cm
Weight: 4000.03 [oz_av]

## 2024-12-31 LAB — LIPID PANEL
Cholesterol: 139 mg/dL (ref 0–200)
HDL: 37 mg/dL — ABNORMAL LOW
LDL Cholesterol: 58 mg/dL (ref 0–99)
Total CHOL/HDL Ratio: 3.7 ratio
Triglycerides: 216 mg/dL — ABNORMAL HIGH
VLDL: 43 mg/dL — ABNORMAL HIGH (ref 0–40)

## 2024-12-31 MED ORDER — PANTOPRAZOLE SODIUM 20 MG PO TBEC
20.0000 mg | DELAYED_RELEASE_TABLET | Freq: Every day | ORAL | Status: DC
  Administered 2024-12-31: 20 mg via ORAL
  Filled 2024-12-31: qty 1

## 2024-12-31 MED ORDER — LACTULOSE 10 GM/15ML PO SOLN: 10.0000 g | Freq: Once | ORAL | Status: DC

## 2024-12-31 MED ORDER — MAGNESIUM CITRATE PO SOLN
1.0000 | Freq: Once | ORAL | Status: AC
  Administered 2024-12-31: 1 via ORAL
  Filled 2024-12-31: qty 296

## 2024-12-31 MED ORDER — BISACODYL 10 MG RE SUPP
10.0000 mg | Freq: Once | RECTAL | Status: AC
  Administered 2024-12-31: 10 mg via RECTAL
  Filled 2024-12-31: qty 1

## 2024-12-31 NOTE — ED Notes (Signed)
 Pt back from restroom, c/o having very little of a bowel movement, requesting more medicine

## 2024-12-31 NOTE — Progress Notes (Signed)
 Communicated with the patient via Caregility. Patient appears comfortable in bed. AVS paperwork given to the patient. Discharge instructions discussed. Patients question answered to satisfaction. Patient agreeable with discharge plan.

## 2024-12-31 NOTE — Progress Notes (Signed)
" °   12/31/24 0744  TOC Brief Assessment  Insurance and Status Reviewed  Patient has primary care physician Yes  Home environment has been reviewed home alone (2 story)  Prior level of function: independent  Prior/Current Home Services No current home services  Social Drivers of Health Review SDOH reviewed no interventions necessary  Readmission risk has been reviewed Yes  Transition of care needs no transition of care needs at this time    "

## 2024-12-31 NOTE — Progress Notes (Addendum)
 Physical Therapy Treatment/DC Note Patient Details Name: [name] MRN: [id number] DOB: 12/07/1945 Today's Date: 12/31/2024   History of Present Illness Pt is a 79 y/o M who presented to [facility] ED for R sided weakness and HA. EMS was called and noted to have a L gaze preference. CTH negative for acute findings. MRI pending. PMHx: thrombocytopenia, PTSD, HLD, HTN, CAD, depression.    PT Comments  Pt mobilizing well and at baseline for mobility. DC from PT.    If plan is discharge home, recommend the following:     Can travel by private vehicle        Equipment Recommendations  None recommended by PT    Recommendations for Other Services       Precautions / Restrictions Precautions Precautions: None Restrictions Weight Bearing Restrictions Per Provider Order: No     Mobility  Bed Mobility Overal bed mobility: Modified Independent       Supine to sit: Modified independent (Device/Increase time) Sit to supine: Modified independent (Device/Increase time)        Transfers Overall transfer level: Modified independent Equipment used: None Transfers: Sit to/from Stand Sit to Stand: Modified independent (Device/Increase time)                Ambulation/Gait Ambulation/Gait assistance: Modified independent (Device/Increase time) Gait Distance (Feet): 350 Feet Assistive device: None Gait Pattern/deviations: Decreased stride length Gait velocity: decr Gait velocity interpretation: 1.31 - 2.62 ft/sec, indicative of limited community ambulator   General Gait Details: Steady gait   Stairs Stairs: Yes Stairs assistance: Supervision Stair Management: One rail Left, Step to pattern, Forwards Number of Stairs: 2 General stair comments: Has bilateral rails at home   Wheelchair Mobility     Tilt Bed    Modified Rankin (Stroke Patients Only) Modified Rankin (Stroke Patients Only) Pre-Morbid Rankin Score: No symptoms Modified Rankin: No symptoms      Balance Overall balance assessment: Mild deficits observed, not formally tested                                          Communication Communication Communication: No apparent difficulties  Cognition Arousal: Alert Behavior During Therapy: WFL for tasks assessed/performed   PT - Cognitive impairments: No apparent impairments                         Following commands: Intact      Cueing    Exercises      General Comments        Pertinent Vitals/Pain Pain Assessment Pain Assessment: No/denies pain    Home Living                          Prior Function            PT Goals (current goals can now be found in the care plan section) Acute Rehab PT Goals Patient Stated Goal: Return Home Progress towards PT goals: Goals met/education completed, patient discharged from PT    Frequency           PT Plan      Co-evaluation              AM-PAC PT 6 Clicks Mobility   Outcome Measure  Help needed turning from your back to your side while in a flat bed without using bedrails?:  None Help needed moving from lying on your back to sitting on the side of a flat bed without using bedrails?: None Help needed moving to and from a bed to a chair (including a wheelchair)?: None Help needed standing up from a chair using your arms (e.g., wheelchair or bedside chair)?: None Help needed to walk in hospital room?: None Help needed climbing 3-5 steps with a railing? : A Little 6 Click Score: 23    End of Session   Activity Tolerance: Patient tolerated treatment well Patient left: in bed;with call bell/phone within reach Nurse Communication: Mobility status PT Visit Diagnosis: Other abnormalities of gait and mobility (R26.89)     Time: 1101-1111 PT Time Calculation (min) (ACUTE ONLY): 10 min  Charges:    $Gait Training: 8-22 mins PT General Charges $$ ACUTE PT VISIT: 1 Visit                     [facility] PT Acute  Rehabilitation Services Office [phone]    [facility] 12/31/2024, 12:57 PM

## 2024-12-31 NOTE — Care Management Obs Status (Signed)
 MEDICARE OBSERVATION STATUS NOTIFICATION   Patient Details  Name: [name] MRN: [id number] Date of Birth: 1946/02/28   Medicare Observation Status Notification Given:  Yes    Debarah Saunas, RN 12/31/2024, 7:41 AM
